# Patient Record
Sex: Female | Born: 1985 | Race: White | Hispanic: No | Marital: Single | Smoking: Current every day smoker
Health system: Southern US, Community
[De-identification: ages and names within clinical notes are randomized; demographics above are authoritative.]

## PROBLEM LIST (undated history)

## (undated) DIAGNOSIS — M1990 Unspecified osteoarthritis, unspecified site: Secondary | ICD-10-CM

## (undated) DIAGNOSIS — R51 Headache: Secondary | ICD-10-CM

## (undated) DIAGNOSIS — Z8489 Family history of other specified conditions: Secondary | ICD-10-CM

## (undated) DIAGNOSIS — T8859XA Other complications of anesthesia, initial encounter: Secondary | ICD-10-CM

## (undated) DIAGNOSIS — R519 Headache, unspecified: Secondary | ICD-10-CM

## (undated) DIAGNOSIS — J189 Pneumonia, unspecified organism: Secondary | ICD-10-CM

## (undated) DIAGNOSIS — Z87898 Personal history of other specified conditions: Secondary | ICD-10-CM

## (undated) DIAGNOSIS — I671 Cerebral aneurysm, nonruptured: Secondary | ICD-10-CM

## (undated) DIAGNOSIS — E079 Disorder of thyroid, unspecified: Secondary | ICD-10-CM

## (undated) DIAGNOSIS — K8020 Calculus of gallbladder without cholecystitis without obstruction: Secondary | ICD-10-CM

## (undated) DIAGNOSIS — K219 Gastro-esophageal reflux disease without esophagitis: Secondary | ICD-10-CM

## (undated) DIAGNOSIS — F4310 Post-traumatic stress disorder, unspecified: Secondary | ICD-10-CM

## (undated) DIAGNOSIS — J45909 Unspecified asthma, uncomplicated: Secondary | ICD-10-CM

## (undated) DIAGNOSIS — T4145XA Adverse effect of unspecified anesthetic, initial encounter: Secondary | ICD-10-CM

## (undated) DIAGNOSIS — Z8709 Personal history of other diseases of the respiratory system: Secondary | ICD-10-CM

## (undated) HISTORY — PX: ABDOMINAL HYSTERECTOMY: SHX81

## (undated) SURGERY — LAPAROSCOPY, DIAGNOSTIC
Anesthesia: General

---

## 2005-04-05 DIAGNOSIS — I671 Cerebral aneurysm, nonruptured: Secondary | ICD-10-CM

## 2005-04-05 HISTORY — DX: Cerebral aneurysm, nonruptured: I67.1

## 2007-12-01 ENCOUNTER — Observation Stay: Payer: Self-pay

## 2007-12-08 ENCOUNTER — Ambulatory Visit: Payer: Self-pay | Admitting: Obstetrics & Gynecology

## 2007-12-12 ENCOUNTER — Inpatient Hospital Stay: Payer: Self-pay | Admitting: Obstetrics & Gynecology

## 2007-12-31 ENCOUNTER — Emergency Department: Payer: Self-pay | Admitting: Emergency Medicine

## 2008-09-01 ENCOUNTER — Emergency Department: Payer: Self-pay | Admitting: Emergency Medicine

## 2010-04-30 ENCOUNTER — Emergency Department: Payer: Self-pay | Admitting: Internal Medicine

## 2010-10-12 ENCOUNTER — Emergency Department: Payer: Self-pay | Admitting: Emergency Medicine

## 2011-05-15 ENCOUNTER — Emergency Department: Payer: Self-pay | Admitting: Emergency Medicine

## 2011-05-15 LAB — PREGNANCY, URINE: Pregnancy Test, Urine: NEGATIVE m[IU]/mL

## 2011-09-04 ENCOUNTER — Emergency Department: Payer: Self-pay | Admitting: Emergency Medicine

## 2011-09-04 LAB — URINALYSIS, COMPLETE
Glucose,UR: NEGATIVE mg/dL (ref 0–75)
Ketone: NEGATIVE
Ph: 5 (ref 4.5–8.0)
Protein: NEGATIVE
RBC,UR: 42 /HPF (ref 0–5)
Renal Epithelial: <1
Squamous Epithelial: 48
WBC UR: 17 /HPF (ref 0–5)

## 2011-09-04 LAB — WET PREP, GENITAL

## 2011-09-07 LAB — BETA STREP CULTURE(ARMC)

## 2011-12-29 ENCOUNTER — Emergency Department: Payer: Self-pay

## 2011-12-29 LAB — CBC
HCT: 45.1 %
HGB: 15.7 g/dL
MCH: 31.2 pg
MCHC: 34.8 g/dL
MCV: 90 fL
Platelet: 240 x10 3/mm 3
RBC: 5.01 X10 6/mm 3
RDW: 12.6 %
WBC: 13.8 x10 3/mm 3 — ABNORMAL HIGH

## 2011-12-29 LAB — COMPREHENSIVE METABOLIC PANEL WITH GFR
Albumin: 3.8 g/dL
Alkaline Phosphatase: 72 U/L
Anion Gap: 10
BUN: 9 mg/dL
Bilirubin,Total: 0.2 mg/dL
Calcium, Total: 9 mg/dL
Chloride: 106 mmol/L
Co2: 23 mmol/L
Creatinine: 0.88 mg/dL
EGFR (African American): >60
EGFR (Non-African Amer.): >60
Glucose: 103 mg/dL — ABNORMAL HIGH
Osmolality: 276
Potassium: 3.6 mmol/L
SGOT(AST): 12 U/L — ABNORMAL LOW
SGPT (ALT): 23 U/L
Sodium: 139 mmol/L
Total Protein: 7.6 g/dL

## 2011-12-29 LAB — URINALYSIS, COMPLETE
Bilirubin,UR: NEGATIVE
Glucose,UR: NEGATIVE mg/dL (ref 0–75)
Ketone: NEGATIVE
Protein: NEGATIVE
Specific Gravity: 1.005 (ref 1.003–1.030)
Squamous Epithelial: 4
WBC UR: 3 /HPF (ref 0–5)

## 2011-12-29 LAB — LIPASE, BLOOD: Lipase: 192 U/L

## 2011-12-29 LAB — PREGNANCY, URINE: Pregnancy Test, Urine: NEGATIVE m[IU]/mL

## 2012-02-05 ENCOUNTER — Emergency Department: Payer: Self-pay | Admitting: Emergency Medicine

## 2012-02-05 LAB — URINALYSIS, COMPLETE
Bacteria: NONE SEEN
Bilirubin,UR: NEGATIVE
Ketone: NEGATIVE
Nitrite: NEGATIVE
Ph: 6 (ref 4.5–8.0)
RBC,UR: NONE SEEN /HPF (ref 0–5)
Specific Gravity: 1.001 (ref 1.003–1.030)
Squamous Epithelial: 6
WBC UR: 2 /HPF (ref 0–5)

## 2012-02-05 LAB — ETHANOL
Ethanol %: 0.045 % (ref 0.000–0.080)
Ethanol: 45 mg/dL

## 2012-02-05 LAB — COMPREHENSIVE METABOLIC PANEL
Alkaline Phosphatase: 71 U/L (ref 50–136)
Bilirubin,Total: 0.2 mg/dL (ref 0.2–1.0)
Calcium, Total: 9.1 mg/dL (ref 8.5–10.1)
Chloride: 111 mmol/L — ABNORMAL HIGH (ref 98–107)
Co2: 20 mmol/L — ABNORMAL LOW (ref 21–32)
Creatinine: 0.72 mg/dL (ref 0.60–1.30)
EGFR (African American): >60
EGFR (Non-African Amer.): >60
SGOT(AST): 21 U/L (ref 15–37)
SGPT (ALT): 25 U/L (ref 12–78)
Sodium: 143 mmol/L (ref 136–145)

## 2012-02-05 LAB — CBC
HCT: 46.3 % (ref 35.0–47.0)
HGB: 15.8 g/dL (ref 12.0–16.0)
MCHC: 34.1 g/dL (ref 32.0–36.0)
RDW: 12.7 % (ref 11.5–14.5)
WBC: 16.9 10*3/uL — ABNORMAL HIGH (ref 3.6–11.0)

## 2012-02-05 LAB — DRUG SCREEN, URINE
Amphetamines, Ur Screen: NEGATIVE (ref ?–1000)
MDMA (Ecstasy)Ur Screen: NEGATIVE (ref ?–500)
Methadone, Ur Screen: NEGATIVE (ref ?–300)
Opiate, Ur Screen: NEGATIVE (ref ?–300)

## 2012-02-05 LAB — TSH: Thyroid Stimulating Horm: <0.01 u[IU]/mL — ABNORMAL LOW

## 2012-02-06 LAB — ACETAMINOPHEN LEVEL: Acetaminophen: <2 ug/mL

## 2012-02-06 LAB — SALICYLATE LEVEL: Salicylates, Serum: 2.2 mg/dL

## 2012-07-31 DIAGNOSIS — R1010 Upper abdominal pain, unspecified: Secondary | ICD-10-CM | POA: Insufficient documentation

## 2012-07-31 DIAGNOSIS — R109 Unspecified abdominal pain: Secondary | ICD-10-CM | POA: Insufficient documentation

## 2012-08-18 DIAGNOSIS — F32A Depression, unspecified: Secondary | ICD-10-CM | POA: Insufficient documentation

## 2013-02-14 ENCOUNTER — Emergency Department: Payer: Self-pay | Admitting: Emergency Medicine

## 2013-05-17 DIAGNOSIS — H7292 Unspecified perforation of tympanic membrane, left ear: Secondary | ICD-10-CM

## 2013-05-17 DIAGNOSIS — F319 Bipolar disorder, unspecified: Secondary | ICD-10-CM | POA: Insufficient documentation

## 2013-05-17 HISTORY — DX: Unspecified perforation of tympanic membrane, left ear: H72.92

## 2013-11-08 DIAGNOSIS — J351 Hypertrophy of tonsils: Secondary | ICD-10-CM | POA: Insufficient documentation

## 2013-11-08 DIAGNOSIS — L578 Other skin changes due to chronic exposure to nonionizing radiation: Secondary | ICD-10-CM | POA: Insufficient documentation

## 2013-11-08 DIAGNOSIS — M5430 Sciatica, unspecified side: Secondary | ICD-10-CM | POA: Insufficient documentation

## 2013-11-08 DIAGNOSIS — F419 Anxiety disorder, unspecified: Secondary | ICD-10-CM | POA: Insufficient documentation

## 2013-11-08 DIAGNOSIS — E669 Obesity, unspecified: Secondary | ICD-10-CM | POA: Insufficient documentation

## 2013-11-08 HISTORY — DX: Other skin changes due to chronic exposure to nonionizing radiation: L57.8

## 2014-01-04 DIAGNOSIS — I729 Aneurysm of unspecified site: Secondary | ICD-10-CM | POA: Insufficient documentation

## 2014-01-04 HISTORY — DX: Aneurysm of unspecified site: I72.9

## 2014-04-18 DIAGNOSIS — L309 Dermatitis, unspecified: Secondary | ICD-10-CM | POA: Insufficient documentation

## 2014-10-18 ENCOUNTER — Emergency Department
Admission: EM | Admit: 2014-10-18 | Discharge: 2014-10-18 | Disposition: A | Discharge status: Home / Self Care | Payer: No Typology Code available for payment source | Attending: Emergency Medicine | Admitting: Emergency Medicine

## 2014-10-18 ENCOUNTER — Emergency Department: Payer: No Typology Code available for payment source

## 2014-10-18 DIAGNOSIS — R51 Headache: Secondary | ICD-10-CM

## 2014-10-18 DIAGNOSIS — Y9389 Activity, other specified: Secondary | ICD-10-CM | POA: Diagnosis not present

## 2014-10-18 DIAGNOSIS — S0990XA Unspecified injury of head, initial encounter: Secondary | ICD-10-CM | POA: Diagnosis not present

## 2014-10-18 DIAGNOSIS — Y92410 Unspecified street and highway as the place of occurrence of the external cause: Secondary | ICD-10-CM | POA: Diagnosis not present

## 2014-10-18 DIAGNOSIS — R519 Headache, unspecified: Secondary | ICD-10-CM

## 2014-10-18 DIAGNOSIS — Y998 Other external cause status: Secondary | ICD-10-CM | POA: Insufficient documentation

## 2014-10-18 MED ORDER — DIAZEPAM 5 MG PO TABS
ORAL_TABLET | ORAL | Status: AC
  Administered 2014-10-18: 5 mg via ORAL
  Filled 2014-10-18: qty 1

## 2014-10-18 MED ORDER — NAPROXEN 500 MG PO TABS: 500.0000 mg | ORAL_TABLET | Freq: Two times a day (BID) | ORAL | Status: DC

## 2014-10-18 MED ORDER — DIAZEPAM 5 MG PO TABS
5.0000 mg | ORAL_TABLET | Freq: Once | ORAL | Status: AC
  Administered 2014-10-18: 5 mg via ORAL

## 2014-10-18 MED ORDER — DIAZEPAM 2 MG PO TABS: 2.0000 mg | ORAL_TABLET | Freq: Three times a day (TID) | ORAL | Status: DC | PRN
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
## 2014-10-18 NOTE — ED Notes (Signed)
Patient transported to CT 
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
## 2015-05-01 ENCOUNTER — Emergency Department
Admission: EM | Admit: 2015-05-01 | Discharge: 2015-05-01 | Disposition: A | Discharge status: Home / Self Care | Payer: Medicaid Other | Attending: Emergency Medicine | Admitting: Emergency Medicine

## 2015-05-01 ENCOUNTER — Emergency Department: Payer: Medicaid Other

## 2015-05-01 DIAGNOSIS — Z880 Allergy status to penicillin: Secondary | ICD-10-CM | POA: Insufficient documentation

## 2015-05-01 DIAGNOSIS — F17200 Nicotine dependence, unspecified, uncomplicated: Secondary | ICD-10-CM | POA: Diagnosis not present

## 2015-05-01 DIAGNOSIS — Z3202 Encounter for pregnancy test, result negative: Secondary | ICD-10-CM | POA: Insufficient documentation

## 2015-05-01 DIAGNOSIS — A0811 Acute gastroenteropathy due to Norwalk agent: Secondary | ICD-10-CM | POA: Insufficient documentation

## 2015-05-01 DIAGNOSIS — R1011 Right upper quadrant pain: Secondary | ICD-10-CM | POA: Diagnosis present

## 2015-05-01 HISTORY — DX: Unspecified osteoarthritis, unspecified site: M19.90

## 2015-05-01 HISTORY — DX: Calculus of gallbladder without cholecystitis without obstruction: K80.20

## 2015-05-01 HISTORY — DX: Unspecified asthma, uncomplicated: J45.909

## 2015-05-01 HISTORY — DX: Cerebral aneurysm, nonruptured: I67.1

## 2015-05-01 LAB — LIPASE, BLOOD: Lipase: 32 U/L (ref 11–51)

## 2015-05-01 LAB — URINALYSIS COMPLETE WITH MICROSCOPIC (ARMC ONLY)
BILIRUBIN URINE: NEGATIVE
GLUCOSE, UA: NEGATIVE mg/dL
Hgb urine dipstick: NEGATIVE
KETONES UR: NEGATIVE mg/dL
Leukocytes, UA: NEGATIVE
Nitrite: NEGATIVE
PH: 6 (ref 5.0–8.0)
Protein, ur: NEGATIVE mg/dL
Specific Gravity, Urine: 1.015 (ref 1.005–1.030)

## 2015-05-01 LAB — COMPREHENSIVE METABOLIC PANEL
ALK PHOS: 61 U/L (ref 38–126)
ALT: 20 U/L (ref 14–54)
AST: 15 U/L (ref 15–41)
Albumin: 4 g/dL (ref 3.5–5.0)
Anion gap: 7 (ref 5–15)
BUN: 10 mg/dL (ref 6–20)
CALCIUM: 9.2 mg/dL (ref 8.9–10.3)
CO2: 24 mmol/L (ref 22–32)
CREATININE: 0.72 mg/dL (ref 0.44–1.00)
Chloride: 108 mmol/L (ref 101–111)
GFR calc non Af Amer: >60 mL/min (ref >60)
Glucose, Bld: 102 mg/dL — ABNORMAL HIGH (ref 65–99)
Potassium: 4 mmol/L (ref 3.5–5.1)
Sodium: 139 mmol/L (ref 135–145)
Total Bilirubin: <0.1 mg/dL — ABNORMAL LOW (ref 0.3–1.2)
Total Protein: 6.9 g/dL (ref 6.5–8.1)

## 2015-05-01 LAB — CBC
HCT: 44.8 % (ref 35.0–47.0)
Hemoglobin: 15 g/dL (ref 12.0–16.0)
MCH: 29.3 pg (ref 26.0–34.0)
MCHC: 33.6 g/dL (ref 32.0–36.0)
MCV: 87.2 fL (ref 80.0–100.0)
PLATELETS: 240 10*3/uL (ref 150–440)
RBC: 5.14 MIL/uL (ref 3.80–5.20)
RDW: 13.2 % (ref 11.5–14.5)
WBC: 14.4 10*3/uL — ABNORMAL HIGH (ref 3.6–11.0)

## 2015-05-01 LAB — POCT PREGNANCY, URINE: Preg Test, Ur: NEGATIVE

## 2015-05-01 MED ORDER — ONDANSETRON HCL 4 MG/2ML IJ SOLN
4.0000 mg | Freq: Once | INTRAMUSCULAR | Status: AC
  Administered 2015-05-01: 4 mg via INTRAVENOUS
  Filled 2015-05-01: qty 2

## 2015-05-01 MED ORDER — MORPHINE SULFATE (PF) 4 MG/ML IV SOLN
4.0000 mg | Freq: Once | INTRAVENOUS | Status: AC
  Administered 2015-05-01: 4 mg via INTRAVENOUS
  Filled 2015-05-01: qty 1

## 2015-05-01 MED ORDER — PROMETHAZINE HCL 25 MG PO TABS: 25.0000 mg | ORAL_TABLET | Freq: Four times a day (QID) | ORAL | Status: DC | PRN

## 2015-05-01 MED ORDER — SODIUM CHLORIDE 0.9 % IV SOLN
1000.0000 mL | Freq: Once | INTRAVENOUS | Status: AC
  Administered 2015-05-01: 1000 mL via INTRAVENOUS

## 2015-05-01 MED ORDER — MORPHINE SULFATE (PF) 4 MG/ML IV SOLN: 4.0000 mg | Freq: Once | INTRAVENOUS | Status: DC

## 2015-05-01 MED ORDER — PROMETHAZINE HCL 25 MG/ML IJ SOLN
25.0000 mg | Freq: Once | INTRAMUSCULAR | Status: AC
  Administered 2015-05-01: 25 mg via INTRAVENOUS
  Filled 2015-05-01: qty 1

## 2015-05-01 MED ORDER — OXYCODONE-ACETAMINOPHEN 5-325 MG PO TABS: 2.0000 | ORAL_TABLET | Freq: Four times a day (QID) | ORAL | Status: DC | PRN

## 2015-05-01 NOTE — ED Provider Notes (Signed)
[facility] Emergency Department Provider Note     Time seen: ----------------------------------------- 7:10 AM on 05/01/2015 -----------------------------------------    I have reviewed the triage vital signs and the nursing notes.   HISTORY  Chief Complaint Abdominal Pain    HPI Elizabeth Spencer is a 30 y.o. female who presents to ER for nausea vomiting and diarrhea. Patient states the last 3 days she has been unable to keep anything down. She does complain of diffuse abdominal pain, worse on the left side. Reportedly she has history of gallstones not pain is unchanged in the right upper quadrant. Sharp pain on left side is worse with walking.   Past Medical History  Diagnosis Date  . Brain aneurysm   . Arthritis   . Asthma   . Gallstones     There are no active problems to display for this patient.   Past Surgical History  Procedure Laterality Date  . Cesarean section      x3    Allergies Codeine and Penicillins  Social History Social History  Substance Use Topics  . Smoking status: Current Every Day Smoker  . Smokeless tobacco: None  . Alcohol Use: No    Review of Systems Constitutional: Negative for fever. Eyes: Negative for visual changes. ENT: Negative for sore throat. Cardiovascular: Negative for chest pain. Respiratory: Negative for shortness of breath. Gastrointestinal: Positive for abdominal pain, vomiting and diarrhea Genitourinary: Positive for urinary hesitancy Musculoskeletal: Negative for back pain. Skin: Negative for rash. Neurological: Negative for headaches, focal weakness or numbness.  10-point ROS otherwise negative.  ____________________________________________   PHYSICAL EXAM:  VITAL SIGNS: ED Triage Vitals  Enc Vitals Group     BP 05/01/15 0603 122/74 mmHg     Pulse Rate 05/01/15 0603 109     Resp 05/01/15 0603 20     Temp 05/01/15 0603 98.1 F (36.7 C)     Temp Source 05/01/15 0603 Oral      SpO2 05/01/15 0603 97 %     Weight 05/01/15 0603 259 lb (117.482 kg)     Height 05/01/15 0603  (1.6 m)     Head Cir --      Peak Flow --      Pain Score 05/01/15 0605 6     Pain Loc --      Pain Edu? --      Excl. in GC? --     Constitutional: Alert and oriented. Well appearing and in no distress. Eyes: Conjunctivae are normal. PERRL. Normal extraocular movements. ENT   Head: Normocephalic and atraumatic.   Nose: No congestion/rhinnorhea.   Mouth/Throat: Mucous membranes are moist.   Neck: No stridor. Cardiovascular: Normal rate, regular rhythm. Normal and symmetric distal pulses are present in all extremities. No murmurs, rubs, or gallops. Respiratory: Normal respiratory effort without tachypnea nor retractions. Breath sounds are clear and equal bilaterally. No wheezes/rales/rhonchi. Gastrointestinal: Soft and non-focally tender, normal bowel sounds. Musculoskeletal: Nontender with normal range of motion in all extremities. No joint effusions.  No lower extremity tenderness nor edema. Neurologic:  Normal speech and language. No gross focal neurologic deficits are appreciated. Speech is normal. No gait instability. Skin:  Skin is warm, dry and intact. No rash noted. Psychiatric: Mood and affect are normal. Speech and behavior are normal. Patient exhibits appropriate insight and judgment. ____________________________________________  ED COURSE:  Pertinent labs & imaging results that were available during my care of the patient were reviewed by me and considered in my medical decision making (  see chart for details). Patient looks well clinically, will receive IV fluids antiemetics and pain control. ____________________________________________    LABS (pertinent positives/negatives)  Labs Reviewed  COMPREHENSIVE METABOLIC PANEL - Abnormal; Notable for the following:    Glucose, Bld 102 (*)    Total Bilirubin <0.1 (*)    All other components within normal limits   CBC - Abnormal; Notable for the following:    WBC 14.4 (*)    All other components within normal limits  URINALYSIS COMPLETEWITH MICROSCOPIC ([facility] ONLY) - Abnormal; Notable for the following:    Color, Urine YELLOW (*)    APPearance CLOUDY (*)    Bacteria, UA FEW (*)    Squamous Epithelial / LPF 0-5 (*)    All other components within normal limits  LIPASE, BLOOD  POC URINE PREG, ED  POCT PREGNANCY, URINE    RADIOLOGY Images were viewed by me  Abdomen 2 view IMPRESSION: 1. Possible cholelithiasis or vicariously excreted contrast in the gallbladder. 2. Normal bowel gas pattern. 3. The presence or absence of free peritoneal air cannot be assessed due to the fact that the hemidiaphragms are not included on the upright view. ____________________________________________  FINAL ASSESSMENT AND PLAN  Gastroenteritis  Plan: Patient with labs and imaging as dictated above. Patient with benign exam, the presence of previously known gallbladder stones was identified here. The pain does not seem to be associated with this. She'll be referred to general surgery for follow-up. She'll be discharged with antiemetics today.   Emily Filbert, MD   Emily Filbert, MD 05/01/15 [phone]

## 2015-05-01 NOTE — Discharge Instructions (Signed)

## 2015-05-01 NOTE — ED Notes (Addendum)
RUQ pain 6/10. Pt states that she is unable to pee, only goes in scant amounts. Sharp pain worse when walking. Pt awoke with this pain. NVD X 3 days. Intermittent chest tightness X 1 week.

## 2015-05-01 NOTE — ED Notes (Signed)
Pt unable to urinate at this time for sample. Informed of need for sample.

## 2015-05-01 NOTE — ED Notes (Signed)
Patient here for abdominal pain, has had emesis x 3 days. Also diagnosed with gallstones by this hospital but has not seen the surgeon we referred her to and does not recall their name. Patient is ambulatory with a slow but steady gait. Color normal for ethnicity. Mucus membranes moist.

## 2015-05-01 NOTE — ED Notes (Signed)
Report to Steven, RN

## 2015-05-01 NOTE — ED Notes (Signed)
MD at bedside. 

## 2015-07-10 ENCOUNTER — Ambulatory Visit: Payer: No Typology Code available for payment source | Attending: Anesthesiology | Admitting: Anesthesiology

## 2015-07-10 ENCOUNTER — Encounter: Payer: Self-pay | Admitting: Anesthesiology

## 2015-07-10 VITALS — BP 107/68 | HR 101 | Temp 97.8°F | Resp 16 | Ht 63.0 in | Wt 260.0 lb

## 2015-07-10 DIAGNOSIS — M5136 Other intervertebral disc degeneration, lumbar region: Secondary | ICD-10-CM

## 2015-07-10 DIAGNOSIS — M5416 Radiculopathy, lumbar region: Secondary | ICD-10-CM

## 2015-07-10 DIAGNOSIS — M545 Low back pain, unspecified: Secondary | ICD-10-CM

## 2015-07-10 NOTE — Patient Instructions (Signed)
Epidural Steroid Injection Patient Information  Description: The epidural space surrounds the nerves as they exit the spinal cord.  In some patients, the nerves can be compressed and inflamed by a bulging disc or a tight spinal canal (spinal stenosis).  By injecting steroids into the epidural space, we can bring irritated nerves into direct contact with a potentially helpful medication.  These steroids act directly on the irritated nerves and can reduce swelling and inflammation which often leads to decreased pain.  Epidural steroids may be injected anywhere along the spine and from the neck to the low back depending upon the location of your pain.   After numbing the skin with local anesthetic (like Novocaine), a small needle is passed into the epidural space slowly.  You may experience a sensation of pressure while this is being done.  The entire block usually last less than 10 minutes.  Conditions which may be treated by epidural steroids:   Low back and leg pain  Neck and arm pain  Spinal stenosis  Post-laminectomy syndrome  Herpes zoster (shingles) pain  Pain from compression fractures  Preparation for the injection:  1. Do not eat any solid food or dairy products within 8 hours of your appointment.  2. You may drink clear liquids up to 3 hours before appointment.  Clear liquids include water, black coffee, juice or soda.  No milk or cream please. 3. You may take your regular medication, including pain medications, with a sip of water before your appointment  Diabetics should hold regular insulin (if taken separately) and take 1/2 normal NPH dos the morning of the procedure.  Carry some sugar containing items with you to your appointment. 4. A driver must accompany you and be prepared to drive you home after your procedure.  5. Bring all your current medications with your. 6. An IV may be inserted and sedation may be given at the discretion of the physician.   7. A blood pressure  cuff, EKG and other monitors will often be applied during the procedure.  Some patients may need to have extra oxygen administered for a short period. 8. You will be asked to provide medical information, including your allergies, prior to the procedure.  We must know immediately if you are taking blood thinners (like Coumadin/Warfarin)  Or if you are allergic to IV iodine contrast (dye). We must know if you could possible be pregnant.  Possible side-effects:  Bleeding from needle site  Infection (rare, may require surgery)  Nerve injury (rare)  Numbness & tingling (temporary)  Difficulty urinating (rare, temporary)  Spinal headache ( a headache worse with upright posture)  Light -headedness (temporary)  Pain at injection site (several days)  Decreased blood pressure (temporary)  Weakness in arm/leg (temporary)  Pressure sensation in back/neck (temporary)  Call if you experience:  Fever/chills associated with headache or increased back/neck pain.  Headache worsened by an upright position.  New onset weakness or numbness of an extremity below the injection site  Hives or difficulty breathing (go to the emergency room)  Inflammation or drainage at the infection site  Severe back/neck pain  Any new symptoms which are concerning to you  Please note:  Although the local anesthetic injected can often make your back or neck feel good for several hours after the injection, the pain will likely return.  It takes 3-7 days for steroids to work in the epidural space.  You may not notice any pain relief for at least that one week.    If effective, we will often do a series of three injections spaced 3-6 weeks apart to maximally decrease your pain.  After the initial series, we generally will wait several months before considering a repeat injection of the same type.  If you have any questions, please call [phone] [facility] 

## 2015-07-10 NOTE — Progress Notes (Signed)
Safety precautions to be maintained throughout the outpatient stay will include: orient to surroundings, keep bed in low position, maintain call bell within reach at all times, provide assistance with transfer out of bed and ambulation.  

## 2015-07-11 MED ORDER — MELOXICAM 7.5 MG PO TABS: 7.5000 mg | ORAL_TABLET | Freq: Every day | ORAL | Status: DC
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
## 2015-09-05 ENCOUNTER — Encounter: Payer: Self-pay | Admitting: Anesthesiology

## 2015-09-05 ENCOUNTER — Ambulatory Visit: Payer: Medicaid Other | Attending: Anesthesiology | Admitting: Anesthesiology

## 2015-09-05 VITALS — BP 112/70 | HR 105 | Temp 98.1°F | Resp 16 | Ht 63.0 in | Wt 216.0 lb

## 2015-09-05 DIAGNOSIS — M5116 Intervertebral disc disorders with radiculopathy, lumbar region: Secondary | ICD-10-CM | POA: Diagnosis not present

## 2015-09-05 DIAGNOSIS — M5136 Other intervertebral disc degeneration, lumbar region: Secondary | ICD-10-CM

## 2015-09-05 DIAGNOSIS — M545 Low back pain, unspecified: Secondary | ICD-10-CM

## 2015-09-05 DIAGNOSIS — G8929 Other chronic pain: Secondary | ICD-10-CM | POA: Diagnosis not present

## 2015-09-05 MED ORDER — CELECOXIB 200 MG PO CAPS: 200.0000 mg | ORAL_CAPSULE | Freq: Two times a day (BID) | ORAL | Status: DC

## 2015-09-05 NOTE — Progress Notes (Signed)
Patient here for procedure today.  Patient states that she has had a cough x 3 weeks and was running a temperature on yesterday.  Did not measure the temperature.   Patient can't take the dosage of Mobic at 7.5 mg and would like dosage to be lowered if possible.  Safety precautions to be maintained throughout the outpatient stay will include: orient to surroundings, keep bed in low position, maintain call bell within reach at all times, provide assistance with transfer out of bed and ambulation.

## 2015-09-08 ENCOUNTER — Telehealth: Payer: Self-pay | Admitting: *Deleted

## 2015-09-08 NOTE — Progress Notes (Signed)
   Subjective:    Patient ID: Elizabeth DallasSusan Spencer, female    DOB: 11/23/1985, 30 y.o.   MRN: [id number]  HPI  This patient was scheduled for a caudal epidural steroid injection and she opted not to have it She [place] and will continue her care there She indicated that the meloxicam which was helping her pain was also causing gastric  Irritation Will switch from meloxicam to Celebrex since she is not hypertensive She appears to be in no distress and has no other complaints  Review of Systems  Constitutional: Negative.   HENT: Negative.   Eyes: Negative.   Respiratory: Negative.   Cardiovascular: Negative.   Gastrointestinal: Negative.   Endocrine: Negative.   Genitourinary: Negative.   Musculoskeletal: Negative.   Skin: Negative.   Allergic/Immunologic: Negative.   Neurological: Negative.   Hematological: Negative.   Psychiatric/Behavioral: Negative.        Objective:   Physical Exam  Cardiovascular:  His patient appeared to be in no distress Her vital signs were stable Blood pressure was 112/70 mmHg Temperature was 98.37F Pulse was 105 bpm Respirations was 16 breaths per minute  SPO2 was 100% Were no new neurological or musculoskeletal findings  Nursing note and vitals reviewed.         Assessment & Plan:   Assessment 1 chronic low back pain 2 lumbar degenerative disc disease 3 Lumbar radiculopathy   Plan of management 1 We'll plan to discontinue meloxicam 2 We'll begin Celebrex 200 mg twice a day aand give her extended tablets 3 [facility] [place]    Established patient       Level III   Tod PersiaWinston Taijon Vink M.D.

## 2015-09-08 NOTE — Telephone Encounter (Signed)
[facility] called stating that the pt needs a prior autho from her insurance for the Celebrex. The phone number for [facility] is [phone] or [phone]...thanks

## 2015-09-08 NOTE — Telephone Encounter (Signed)
No answer- unable to leave message- mailbox not set up

## 2015-09-26 ENCOUNTER — Telehealth: Payer: Self-pay | Admitting: *Deleted

## 2015-09-26 MED ORDER — MELOXICAM 7.5 MG PO TABS: 7.5000 mg | ORAL_TABLET | Freq: Two times a day (BID) | ORAL | Status: DC

## 2015-09-26 NOTE — Addendum Note (Signed)
Addended by: Tod PersiaPARRIS, Xian Apostol on: 09/26/2015 09:40 AM   Modules accepted: Orders, Medications

## 2015-09-26 NOTE — Progress Notes (Signed)
   Subjective:    Patient ID: Elizabeth DallasSusan Spencer, female    DOB: 09/19/1985, 30 y.o.   MRN: [id number]  HPI  This patient was prescribed Celebrex for her pain that her insurance company refused coverage of that drug I will therefore with drawer the Celebrex prescription and instead authorize meloxicam 7.5 mg twice a day pc.  and give her 42 tablets.  Tod PersiaWinston Emmet Messer M.D.  Review of Systems     Objective:   Physical Exam        Assessment & Plan:

## 2015-09-26 NOTE — Telephone Encounter (Signed)
Patient notified that Meloxicam has been called in. Patient states she cannot take Meloxicam because it is too strong. Does not know what similar medication insurance will cover. Dr. Starling MannsParris notified. Instructed to have patient take it once per day instead of twice per day. Attempted to call patient, voice mailbox not set up.

## 2015-09-26 NOTE — Telephone Encounter (Signed)
[facility] called inquiring about the status of the med they requested the this pt. The number is [phone]...thanks

## 2015-09-26 NOTE — Telephone Encounter (Signed)
Patient advised to try taking one Meloxicam per day. She says she has tried that before, it makes her sick. She will call her insurance company and find out what NSAIDS they will cover and call us back.

## 2015-11-15 ENCOUNTER — Emergency Department
Admission: EM | Admit: 2015-11-15 | Discharge: 2015-11-15 | Disposition: A | Discharge status: Home / Self Care | Payer: Medicaid Other | Attending: Emergency Medicine | Admitting: Emergency Medicine

## 2015-11-15 DIAGNOSIS — Z79899 Other long term (current) drug therapy: Secondary | ICD-10-CM | POA: Insufficient documentation

## 2015-11-15 DIAGNOSIS — Z791 Long term (current) use of non-steroidal anti-inflammatories (NSAID): Secondary | ICD-10-CM | POA: Insufficient documentation

## 2015-11-15 DIAGNOSIS — K0889 Other specified disorders of teeth and supporting structures: Secondary | ICD-10-CM | POA: Insufficient documentation

## 2015-11-15 DIAGNOSIS — F17200 Nicotine dependence, unspecified, uncomplicated: Secondary | ICD-10-CM | POA: Insufficient documentation

## 2015-11-15 DIAGNOSIS — J45909 Unspecified asthma, uncomplicated: Secondary | ICD-10-CM | POA: Diagnosis not present

## 2015-11-15 MED ORDER — OXYCODONE-ACETAMINOPHEN 5-325 MG PO TABS: 1.0000 | ORAL_TABLET | ORAL | 0 refills | Status: DC | PRN

## 2015-11-15 MED ORDER — LIDOCAINE VISCOUS 2 % MT SOLN
15.0000 mL | Freq: Once | OROMUCOSAL | Status: AC
  Administered 2015-11-15: 15 mL via OROMUCOSAL
  Filled 2015-11-15: qty 15

## 2015-11-15 MED ORDER — KETOROLAC TROMETHAMINE 10 MG PO TABS
10.0000 mg | ORAL_TABLET | Freq: Once | ORAL | Status: AC
  Administered 2015-11-15: 10 mg via ORAL
  Filled 2015-11-15: qty 1

## 2015-11-15 MED ORDER — CLINDAMYCIN HCL 150 MG PO CAPS
300.0000 mg | ORAL_CAPSULE | Freq: Once | ORAL | Status: AC
  Administered 2015-11-15: 300 mg via ORAL
  Filled 2015-11-15: qty 2

## 2015-11-15 MED ORDER — CLINDAMYCIN HCL 300 MG PO CAPS: 300.0000 mg | ORAL_CAPSULE | Freq: Three times a day (TID) | ORAL | 0 refills | Status: AC

## 2015-11-15 NOTE — ED Notes (Signed)
Pt alert and oriented X4, active, cooperative, pt in NAD. RR even and unlabored, color WNL.  Pt informed to return if any life threatening symptoms occur.   

## 2015-11-15 NOTE — ED Triage Notes (Addendum)
Pt ambulatory to triage with no difficulty. Pt reports she had a filling to a top left molar several months ago. Pt reports since she has been having pain to the tooth. About 3 weeks ago she saw her primary care doctor and she put her on antibiotics for "pain in her ear". Yesterday she developed swelling to her left face and worse pain to her tooth. Pt talking in full and complete sentences at this time with no difficulty.

## 2015-11-15 NOTE — ED Provider Notes (Signed)
[facility] Emergency Department Provider Note  ____________________________________________   First MD Initiated Contact with Patient 11/15/15 0113     (approximate)  I have reviewed the triage vital signs and the nursing notes.   HISTORY  Chief Complaint Otalgia and Dental Pain   HPI Elizabeth Spencer is a 30 y.o. female presents with left maxillary molar pain that is currently 10 out of 10 with onset several months ago after having filling performed. Patient states this episode has been going on for the past day with associated swelling of the jaw. Patient states that she was prescribed antibiotics by her primary care provider however she cannot recall the name of the antibiotic stating that it was 300 mg. Patient states that she is allergic to many different antibiotics.  Past Medical History:  Diagnosis Date  . Arthritis   . Asthma   . Brain aneurysm   . Gallstones     There are no active problems to display for this patient.   Past Surgical History:  Procedure Laterality Date  . CESAREAN SECTION     x3    Prior to Admission medications   Medication Sig Start Date End Date Taking? Authorizing Provider  albuterol (PROVENTIL HFA;VENTOLIN HFA) 108 (90 Base) MCG/ACT inhaler Inhale 1-2 puffs into the lungs every 6 (six) hours as needed.  01/03/15   Historical Provider, MD  cetirizine (ZYRTEC) 10 MG tablet Take 10 mg by mouth as needed. Reported on 09/05/2015 01/03/15 01/03/16  Historical Provider, MD  clindamycin (CLEOCIN) 300 MG capsule Take 1 capsule (300 mg total) by mouth 3 (three) times daily. 11/15/15 11/25/15  Darci Current, MD  EPIPEN 2-PAK 0.3 MG/0.3ML SOAJ injection INJECT INTO THIGH MUSCLE THROUGH CLOTHES AS NEEDED SEVERE ALLERGIC REACTION 01/29/15   Historical Provider, MD  meloxicam (MOBIC) 7.5 MG tablet Take 1 tablet (7.5 mg total) by mouth 2 (two) times daily after a meal. 09/26/15   Tod Persia, MD  naproxen sodium (ANAPROX) 550 MG tablet  Take 550 mg by mouth 3 (three) times daily with meals.    Historical Provider, MD  oxyCODONE-acetaminophen (PERCOCET) 5-325 MG tablet Take 2 tablets by mouth every 6 (six) hours as needed for moderate pain or severe pain. Patient not taking: Reported on 07/10/2015 05/01/15   Emily Filbert, MD  oxyCODONE-acetaminophen (ROXICET) 5-325 MG tablet Take 1 tablet by mouth every 4 (four) hours as needed for severe pain. 11/15/15   Darci Current, MD  promethazine (PHENERGAN) 25 MG tablet Take 1 tablet (25 mg total) by mouth every 6 (six) hours as needed for nausea or vomiting. Patient not taking: Reported on 07/10/2015 05/01/15   Emily Filbert, MD    Allergies Neurontin [gabapentin]; Oxycodone; Codeine; and Other  Family History  Problem Relation Age of Onset  . Hypertension Mother   . Alcohol abuse Mother   . Alcohol abuse Father   . COPD Father   . Emphysema Father   . Heart disease Father     Social History Social History  Substance Use Topics  . Smoking status: Current Every Day Smoker  . Smokeless tobacco: Not on file  . Alcohol use No    Review of Systems Constitutional: No fever/chills Eyes: No visual changes. ENT: No sore throat.Positive for dental pain Cardiovascular: Denies chest pain. Respiratory: Denies shortness of breath. Gastrointestinal: No abdominal pain.  No nausea, no vomiting.  No diarrhea.  No constipation. Genitourinary: Negative for dysuria. Musculoskeletal: Negative for back pain. Skin: Negative for rash.  Neurological: Negative for headaches, focal weakness or numbness.  10-point ROS otherwise negative.  ____________________________________________   PHYSICAL EXAM:  VITAL SIGNS: ED Triage Vitals  Enc Vitals Group     BP 11/15/15 0027 115/63     Pulse Rate 11/15/15 0027 98     Resp 11/15/15 0027 18     Temp 11/15/15 0027 98.2 F (36.8 C)     Temp Source 11/15/15 0027 Oral     SpO2 11/15/15 0027 100 %     Weight 11/15/15 0027 235 lb  (106.6 kg)     Height 11/15/15 0027 5\' 3"  (1.6 m)     Head Circumference --      Peak Flow --      Pain Score 11/15/15 0028 10     Pain Loc --      Pain Edu? --      Excl. in GC? --     Constitutional: Alert and oriented. Well appearing and in no acute distress. Eyes: Conjunctivae are normal. PERRL. EOMI. Head: Atraumatic. Ears:  Healthy appearing ear canals and TMs bilaterally Nose: No congestion/rhinnorhea. Mouth/Throat: Mucous membranes are moist.  Oropharynx non-erythematous. Neck: No stridor.  No meningeal signs. Skin:  Skin is warm, dry and intact. No rash noted. Psychiatric: Mood and affect are normal. Speech and behavior are normal.  ____________________________________________   LABS (all labs ordered are listed, but only abnormal results are displayed)  Labs Reviewed - No data to display   RADIOLOGY I, Wayzata N Jodee Wagenaar, personally viewed and evaluated these images (plain radiographs) as part of my medical decision making, as well as reviewing the written report by the radiologist.  No results found.   Procedures     INITIAL IMPRESSION / ASSESSMENT AND PLAN / ED COURSE  Pertinent labs & imaging results that were available during my care of the patient were reviewed by me and considered in my medical decision making (see chart for details).  Patient given Percocet and clindamycin his dislike insufficiency that and emergency department. Patient referred to dentist  Clinical Course    ____________________________________________  FINAL CLINICAL IMPRESSION(S) / ED DIAGNOSES  Final diagnoses:  Pain, dental     MEDICATIONS GIVEN DURING THIS VISIT:  Medications  ketorolac (TORADOL) tablet 10 mg (10 mg Oral Given 11/15/15 0147)  lidocaine (XYLOCAINE) 2 % viscous mouth solution 15 mL (15 mLs Mouth/Throat Given 11/15/15 0148)  clindamycin (CLEOCIN) capsule 300 mg (300 mg Oral Given 11/15/15 0147)     NEW OUTPATIENT MEDICATIONS STARTED DURING THIS  VISIT:  New Prescriptions   CLINDAMYCIN (CLEOCIN) 300 MG CAPSULE    Take 1 capsule (300 mg total) by mouth 3 (three) times daily.   OXYCODONE-ACETAMINOPHEN (ROXICET) 5-325 MG TABLET    Take 1 tablet by mouth every 4 (four) hours as needed for severe pain.      Note:  This document was prepared using Dragon voice recognition software and may include unintentional dictation errors.    Darci Currentandolph N Racquelle Hyser, MD 11/15/15 [phone]

## 2016-01-27 DIAGNOSIS — R053 Chronic cough: Secondary | ICD-10-CM

## 2016-01-27 HISTORY — DX: Chronic cough: R05.3

## 2016-07-05 ENCOUNTER — Telehealth: Payer: Self-pay | Admitting: Obstetrics & Gynecology

## 2016-07-05 NOTE — Telephone Encounter (Signed)
Called and LVM To schedule Pt for Referral From [facility] for irregular Mense. Please schedule when Pt calls

## 2016-07-06 NOTE — Telephone Encounter (Signed)
Pt is schedule with DR. Jean Rosenthal 07/28/16 at 3:10

## 2016-07-28 ENCOUNTER — Ambulatory Visit: Payer: Self-pay | Admitting: Obstetrics and Gynecology

## 2016-08-10 ENCOUNTER — Ambulatory Visit (INDEPENDENT_AMBULATORY_CARE_PROVIDER_SITE_OTHER): Payer: Medicaid Other | Admitting: Obstetrics and Gynecology

## 2016-08-10 ENCOUNTER — Encounter: Payer: Self-pay | Admitting: Obstetrics and Gynecology

## 2016-08-10 VITALS — BP 122/72 | HR 97 | Ht 63.0 in | Wt 246.0 lb

## 2016-08-10 DIAGNOSIS — N930 Postcoital and contact bleeding: Secondary | ICD-10-CM | POA: Diagnosis not present

## 2016-08-10 DIAGNOSIS — N907 Vulvar cyst: Secondary | ICD-10-CM | POA: Diagnosis not present

## 2016-08-18 NOTE — Progress Notes (Signed)
Obstetrics & Gynecology Office Visit   Chief Complaint:  Chief Complaint  Patient presents with  . vaginal lump    painful x 1 day    History of Present Illness: 31 year old G3P3 presenting with labial lesion noted approximately 3 days ago following intercourse.  No vaginal discharge, no ulceration or drainage from lesion noted by patient.  The lesion is not significantly tender but does cause some discomfort.  She has this happen previously with area of concern resolving spontaneously.  No new contacts exposures such as new soaps, detergents, condoms etc.  She does report postcoital bleeding.  No history of HSV.   Review of Systems: Review of Systems  Constitutional: Negative for chills and fever.  Skin: Positive for rash. Negative for itching.  Remainder of review of systems negative unless noted in HPI  Past Medical History:  Past Medical History:  Diagnosis Date  . Arthritis   . Asthma   . Brain aneurysm   . Gallstones     Past Surgical History:  Past Surgical History:  Procedure Laterality Date  . CESAREAN SECTION     x3    Gynecologic History: Patient's last menstrual period was 07/27/2016 (exact date).  Obstetric History: G3P3  Family History:  Family History  Problem Relation Age of Onset  . Hypertension Mother   . Alcohol abuse Mother   . Alcohol abuse Father   . COPD Father   . Emphysema Father   . Heart disease Father     Social History:  Social History   Social History  . Marital status: Single    Spouse name: N/A  . Number of children: N/A  . Years of education: N/A   Occupational History  . Not on file.   Social History Main Topics  . Smoking status: Current Every Day Smoker  . Smokeless tobacco: Never Used  . Alcohol use No  . Drug use: No  . Sexual activity: Yes    Partners: Male    Birth control/ protection: None   Other Topics Concern  . Not on file   Social History Narrative  . No narrative on file    Allergies:    Allergies  Allergen Reactions  . Neurontin [Gabapentin] Other (See Comments)    Does not remember anything  . Oxycodone Nausea And Vomiting    dizziness  . Codeine Hives  . Other Other (See Comments)    PT states she is allergic to any of the Cillins but also states that every time she is prescribed a new antibiotic she adds it to the list.  PT has throat swelling and trouble breathing currently with all Cillins.     Medications: Prior to Admission medications   Medication Sig Start Date End Date Taking? Authorizing Provider  albuterol (PROVENTIL HFA;VENTOLIN HFA) 108 (90 Base) MCG/ACT inhaler Inhale 1-2 puffs into the lungs every 6 (six) hours as needed.  01/03/15  Yes [provider]  EPIPEN 2-PAK 0.3 MG/0.3ML SOAJ injection INJECT INTO THIGH MUSCLE THROUGH CLOTHES AS NEEDED SEVERE ALLERGIC REACTION 01/29/15  Yes [provider]  cetirizine (ZYRTEC) 10 MG tablet Take 10 mg by mouth as needed. Reported on 09/05/2015 01/03/15 01/03/16  [provider]    Physical Exam Vitals:  Vitals:   08/10/16 1410  BP: 122/72  Pulse: 97   Patient's last menstrual period was 07/27/2016 (exact date).  General: NAD HEENT: normocephalic, anicteric Pulmonary: No increased work of breathing Genitourinary:  External: Normal external female  genitalia.  Normal urethral meatus, normal Bartholin's and Skene's glands.  Area in question is to the right of the  clitoral hood. Small 3mm raised round area, soft, mobile, no induration or erythema   Vagina: Normal vaginal mucosa, no evidence of prolapse.    Cervix: Grossly normal in appearance, no bleeding  Uterus: Non-enlarged, mobile, normal contour.  No CMT  Adnexa: ovaries non-enlarged, no adnexal masses  Rectal: deferred  Lymphatic: no evidence of inguinal lymphadenopathy Extremities: no edema, erythema, or tenderness Neurologic: Grossly intact Psychiatric: mood appropriate, affect full  Physical Exam  Genitourinary:       Female chaperone present for pelvic portions of the physical exam  Assessment: 31 y.o. G3P3 with labial lesion  Plan: Problem List Items Addressed This Visit    None    Visit Diagnoses    Postcoital and contact bleeding    -  Primary   Relevant Orders   US Transvaginal Non-OB   Labial cyst         Lesion does not appear to be infectious, this could represent some normal lymphatic tissue, clogged pore.  Recommend warm compresses and observation.  Will obtain nuswab to rule out infectious etiology for postcoital spotting, cervix grossly normal in appearance.  TVUS ordered to evaluation of intrauterine pathology

## 2016-08-20 ENCOUNTER — Ambulatory Visit: Payer: Self-pay | Admitting: Obstetrics and Gynecology

## 2016-08-23 ENCOUNTER — Ambulatory Visit: Payer: Self-pay | Admitting: Obstetrics and Gynecology

## 2016-08-23 ENCOUNTER — Other Ambulatory Visit: Payer: Self-pay

## 2016-09-03 ENCOUNTER — Other Ambulatory Visit: Payer: Self-pay

## 2016-09-03 ENCOUNTER — Ambulatory Visit: Payer: Medicaid Other | Admitting: Obstetrics and Gynecology

## 2016-10-04 ENCOUNTER — Other Ambulatory Visit: Payer: Self-pay

## 2016-10-04 ENCOUNTER — Ambulatory Visit: Payer: Medicaid Other | Admitting: Obstetrics and Gynecology

## 2016-10-22 ENCOUNTER — Telehealth: Payer: Self-pay

## 2016-10-22 NOTE — Telephone Encounter (Signed)
I spoke to patient and she stated the vaginal lesion is very painful to touch x few days. She has done a warm bath with no relief.. I explained AMS was out of the office today, she scheduled a follow up for next week. Advised if gets worse over weekend urgent care/ER would be best options.

## 2016-10-22 NOTE — Telephone Encounter (Signed)
Spoke with pt and advised that Dr. Bonney AidStaebler doesn't have any openings for next week and I could schedule her to see someone next week. Pt stated she only wanted to see Dr. Bonney AidStaebler and that she would wait until he had an opening. Pt is scheduled for 11/03/16 for US and OV. Thanks TNP

## 2016-10-22 NOTE — Telephone Encounter (Signed)
Pt calling AMS to find out what she can do for these painful skin lumps.. Pt states AMS told her this is normal but she's in pain please advise

## 2016-11-03 ENCOUNTER — Other Ambulatory Visit: Payer: Medicaid Other

## 2016-11-03 ENCOUNTER — Ambulatory Visit: Payer: Medicaid Other | Admitting: Obstetrics and Gynecology

## 2016-11-17 ENCOUNTER — Other Ambulatory Visit: Payer: Medicaid Other

## 2016-11-17 ENCOUNTER — Ambulatory Visit: Payer: Medicaid Other | Admitting: Obstetrics and Gynecology

## 2016-11-25 NOTE — Progress Notes (Deleted)
    Gynecology Ultrasound Follow Up  Chief Complaint: No chief complaint on file.    History of Present Illness: Patient is a 31 y.o. female who presents today for ultrasound evaluation of *** .  Ultrasound demonstrates the following findgins Adnexa: {adnexa findings ultrasound:314145} {Desc; adnexal mass:12229} Uterus: *** with endometrial stripe  {Numbers; 0-20:10930}.{Numbers; 0-20:10930} {Desc; mm/cm/in:30693} Additional: ***  Review of Systems: ROS  Past Medical History:  Past Medical History:  Diagnosis Date  . Arthritis   . Asthma   . Brain aneurysm   . Gallstones     Past Surgical History:  Past Surgical History:  Procedure Laterality Date  . CESAREAN SECTION     x3    Gynecologic History:  No LMP recorded. Contraception: {method:5051} Last Pap: ***. Results were: .{Findings; lab pap smear results:16707::"no abnormalities"}  Family History:  Family History  Problem Relation Age of Onset  . Hypertension Mother   . Alcohol abuse Mother   . Alcohol abuse Father   . COPD Father   . Emphysema Father   . Heart disease Father     Social History:  Social History   Social History  . Marital status: Single    Spouse name: N/A  . Number of children: N/A  . Years of education: N/A   Occupational History  . Not on file.   Social History Main Topics  . Smoking status: Current Every Day Smoker  . Smokeless tobacco: Never Used  . Alcohol use No  . Drug use: No  . Sexual activity: Yes    Partners: Male    Birth control/ protection: None   Other Topics Concern  . Not on file   Social History Narrative  . No narrative on file    Allergies:  Allergies  Allergen Reactions  . Neurontin [Gabapentin] Other (See Comments)    Does not remember anything  . Oxycodone Nausea And Vomiting    dizziness  . Codeine Hives  . Other Other (See Comments)    PT states she is allergic to any of the Cillins but also states that every time she is prescribed a new  antibiotic she adds it to the list.  PT has throat swelling and trouble breathing currently with all Cillins.     Medications: Prior to Admission medications   Medication Sig Start Date End Date Taking? Authorizing Provider  albuterol (PROVENTIL HFA;VENTOLIN HFA) 108 (90 Base) MCG/ACT inhaler Inhale 1-2 puffs into the lungs every 6 (six) hours as needed.  01/03/15   [provider]  cetirizine (ZYRTEC) 10 MG tablet Take 10 mg by mouth as needed. Reported on 09/05/2015 01/03/15 01/03/16  [provider]  EPIPEN 2-PAK 0.3 MG/0.3ML SOAJ injection INJECT INTO THIGH MUSCLE THROUGH CLOTHES AS NEEDED SEVERE ALLERGIC REACTION 01/29/15   [provider]    Physical Exam Vitals: There were no vitals taken for this visit.  General: NAD HEENT: normocephalic, anicteric Pulmonary: No increased work of breathing Extremities: no edema, erythema, or tenderness Neurologic: Grossly intact, normal gait Psychiatric: mood appropriate, affect full   Assessment: 31 y.o. G3P3 No problem-specific Assessment & Plan notes found for this encounter.   Plan: Problem List Items Addressed This Visit    None      1)

## 2016-11-26 ENCOUNTER — Ambulatory Visit: Payer: Medicaid Other | Admitting: Obstetrics and Gynecology

## 2016-11-26 ENCOUNTER — Other Ambulatory Visit: Payer: Medicaid Other

## 2016-12-14 ENCOUNTER — Ambulatory Visit (INDEPENDENT_AMBULATORY_CARE_PROVIDER_SITE_OTHER): Payer: Medicaid Other | Admitting: Obstetrics and Gynecology

## 2016-12-14 ENCOUNTER — Ambulatory Visit (INDEPENDENT_AMBULATORY_CARE_PROVIDER_SITE_OTHER): Payer: Medicaid Other

## 2016-12-14 VITALS — BP 112/62 | HR 107 | Ht 63.0 in | Wt 247.0 lb

## 2016-12-14 DIAGNOSIS — N930 Postcoital and contact bleeding: Secondary | ICD-10-CM

## 2016-12-14 DIAGNOSIS — Z3009 Encounter for other general counseling and advice on contraception: Secondary | ICD-10-CM | POA: Diagnosis not present

## 2016-12-14 DIAGNOSIS — L739 Follicular disorder, unspecified: Secondary | ICD-10-CM | POA: Diagnosis not present

## 2016-12-14 MED ORDER — CEPHALEXIN 500 MG PO CAPS: 500.0000 mg | ORAL_CAPSULE | Freq: Three times a day (TID) | ORAL | 0 refills | Status: DC

## 2016-12-14 NOTE — Progress Notes (Signed)
Gynecology Ultrasound Follow Up  Chief Complaint:  Chief Complaint  Patient presents with  . Follow-up    GYN U/S      History of Present Illness: Patient is a 31 y.o. female who presents today for ultrasound evaluation of postcoital spotting .  Ultrasound demonstrates the following findgins Adnexa: normal bilateral adnexa Uterus:Non-enlarged uterus, no fibroids, with endometrial stripe somewhat heterogenous and indistinct myometrial/endometrial interface consistent with possible adenomyosis (the patient does report dysmenorrhea) Additional: no free fluid  She states the previously noted periclitoral lesion   Review of Systems: Review of Systems  Constitutional: Negative for chills and fever.  HENT: Negative for congestion.   Respiratory: Negative for cough and shortness of breath.   Cardiovascular: Negative for chest pain and palpitations.  Gastrointestinal: Negative for abdominal pain, constipation, diarrhea, heartburn, nausea and vomiting.  Genitourinary: Negative for dysuria, frequency and urgency.  Skin: Negative for itching and rash.  Neurological: Negative for dizziness and headaches.  Endo/Heme/Allergies: Negative for polydipsia.  Psychiatric/Behavioral: Negative for depression.    Past Medical History:  Past Medical History:  Diagnosis Date  . Arthritis   . Asthma   . Brain aneurysm   . Gallstones     Past Surgical History:  Past Surgical History:  Procedure Laterality Date  . CESAREAN SECTION     x3    Gynecologic History:  Patient's last menstrual period was 12/14/2016.  Family History:  Family History  Problem Relation Age of Onset  . Hypertension Mother   . Alcohol abuse Mother   . Alcohol abuse Father   . COPD Father   . Emphysema Father   . Heart disease Father     Social History:  Social History   Social History  . Marital status: Single    Spouse name: N/A  . Number of children: N/A  . Years of education: N/A   Occupational  History  . Not on file.   Social History Main Topics  . Smoking status: Current Every Day Smoker  . Smokeless tobacco: Never Used  . Alcohol use No  . Drug use: No  . Sexual activity: Yes    Partners: Male    Birth control/ protection: None   Other Topics Concern  . Not on file   Social History Narrative  . No narrative on file    Allergies:  Allergies  Allergen Reactions  . Neurontin [Gabapentin] Other (See Comments)    Does not remember anything  . Oxycodone Nausea And Vomiting    dizziness  . Codeine Hives  . Other Other (See Comments)    PT states she is allergic to any of the Cillins but also states that every time she is prescribed a new antibiotic she adds it to the list.  PT has throat swelling and trouble breathing currently with all Cillins.     Medications: Prior to Admission medications   Medication Sig Start Date End Date Taking? Authorizing Provider  albuterol (PROVENTIL HFA;VENTOLIN HFA) 108 (90 Base) MCG/ACT inhaler Inhale 1-2 puffs into the lungs every 6 (six) hours as needed.  01/03/15   [provider]  cephALEXin (KEFLEX) 500 MG capsule Take 1 capsule (500 mg total) by mouth 3 (three) times daily. 12/14/16   Vena Austria, MD  cetirizine (ZYRTEC) 10 MG tablet Take 10 mg by mouth as needed. Reported on 09/05/2015 01/03/15 01/03/16  [provider]  EPIPEN 2-PAK 0.3 MG/0.3ML SOAJ injection INJECT INTO THIGH MUSCLE THROUGH CLOTHES AS NEEDED SEVERE ALLERGIC REACTION 01/29/15  [provider]    Physical Exam Vitals: Blood pressure 112/62, pulse (!) 107, height 5\' 3"  (1.6 m), weight 247 lb (112 kg), last menstrual period 12/14/2016.  General: NAD HEENT: normocephalic, anicteric Pulmonary: No increased work of breathing Extremities: no edema, erythema, or tenderness GU: normal external female genitalia, several smal areas consistent with folliculitis on internal thigh and vulva Neurologic: Grossly intact, normal  gait Psychiatric: mood appropriate, affect full   Assessment: 31 y.o. G3P3 No problem-specific Assessment & Plan notes found for this encounter.   Plan: Problem List Items Addressed This Visit    None    Visit Diagnoses    Folliculitis    -  Primary   Postcoital bleeding       Counseling for birth control regarding intrauterine device (IUD)          1) Postcoital spotting - still intermitten, aptima done last visit I do not see results.  Will repeat at next visit with IUD placement. Patient plans IUD, and will schedule soon.  Risks and benefits of IUD discussed including the risks of irregular bleeding, cramping, infection, malpositioning, expulsion or uterine perforation of the IUD (1:1000 placements)  which may require further procedures such as laparoscopy.  IUDs while effective at preventing pregnancy do not prevent transmission of sexually transmitted diseases and use of barrier methods for this purpose was discussed.  Low overall incidence of failure with 99.7% efficacy rate in typical use.  The patient has not contraindication to IUD placement.  2) Foliculitis - rx keflex, avoid shaving consider hair removal product as opposed to shaving.    3) A total of 15 minutes were spent in face-to-face contact with the patient during this encounter with over half of that time devoted to counseling and coordination of care.           Foliculitis rx keflex To call with next menses for mirena structurally normal uterus possible adenomyosis

## 2017-01-03 ENCOUNTER — Telehealth: Payer: Self-pay

## 2017-01-03 NOTE — Telephone Encounter (Signed)
Pt thinks she is having complications with antibx AMS gave her.  Please call.  [phone]

## 2017-01-03 NOTE — Telephone Encounter (Signed)
Vaginal area itches/beat red/raw/burning. Pt also, states she did not had a cycle at the end of the month but had it 2 weeks earlier, just had S&S.

## 2017-01-03 NOTE — Telephone Encounter (Signed)
Sounds like a yeast infection but could be any number of things.  She can try OTC monistat if that does not improve symptoms I'll probably need to evalute her in person

## 2017-01-04 NOTE — Telephone Encounter (Signed)
Pt is calling back . Please advise

## 2017-01-04 NOTE — Telephone Encounter (Signed)
Message left for patient to call me back.

## 2017-01-04 NOTE — Telephone Encounter (Signed)
Pt aware of AMS message 

## 2017-03-15 ENCOUNTER — Ambulatory Visit: Payer: Medicaid Other | Admitting: Obstetrics and Gynecology

## 2017-03-31 ENCOUNTER — Ambulatory Visit: Payer: Medicaid Other | Admitting: Obstetrics and Gynecology

## 2017-04-08 DIAGNOSIS — M47816 Spondylosis without myelopathy or radiculopathy, lumbar region: Secondary | ICD-10-CM | POA: Insufficient documentation

## 2017-05-02 ENCOUNTER — Encounter: Payer: Self-pay | Admitting: Obstetrics and Gynecology

## 2017-05-02 ENCOUNTER — Ambulatory Visit: Payer: Medicaid Other | Admitting: Obstetrics and Gynecology

## 2017-05-02 VITALS — BP 102/70 | HR 107 | Ht 63.0 in | Wt 243.0 lb

## 2017-05-02 DIAGNOSIS — Z113 Encounter for screening for infections with a predominantly sexual mode of transmission: Secondary | ICD-10-CM | POA: Diagnosis not present

## 2017-05-02 DIAGNOSIS — N946 Dysmenorrhea, unspecified: Secondary | ICD-10-CM | POA: Diagnosis not present

## 2017-05-02 DIAGNOSIS — N941 Unspecified dyspareunia: Secondary | ICD-10-CM | POA: Diagnosis not present

## 2017-05-02 DIAGNOSIS — L02439 Carbuncle of limb, unspecified: Secondary | ICD-10-CM

## 2017-05-02 DIAGNOSIS — L02429 Furuncle of limb, unspecified: Secondary | ICD-10-CM

## 2017-05-02 DIAGNOSIS — N939 Abnormal uterine and vaginal bleeding, unspecified: Secondary | ICD-10-CM

## 2017-05-02 MED ORDER — NAPROXEN 500 MG PO TABS: 500.0000 mg | ORAL_TABLET | Freq: Two times a day (BID) | ORAL | 2 refills | Status: DC

## 2017-05-02 MED ORDER — NORETHINDRONE ACETATE 5 MG PO TABS: 5.0000 mg | ORAL_TABLET | Freq: Every day | ORAL | 11 refills | Status: DC

## 2017-05-02 NOTE — Progress Notes (Signed)
Obstetrics & Gynecology Office Visit   Chief Complaint:  Chief Complaint  Patient presents with  . vaginal lump    x2 days  . Menorrhagia    History of Present Illness: 32 year old [id number] with abnormal uterine bleeding.  She has had prior evaluation with normal TVUS in the last 6 months.  She continues to also report significant dysmenorrhea and dyspareunia.  Her pelvic pain is exacerbated during menses but it is present throughout the month.  Left lower quadrant more noticeable than right lower quadrant.  12/10 per patient.  Some radiation into groin.  We had previously discussed trial of Mirena IUD but she declines secondary to her sister having had complications with hers.  She denies vaginal discharge.  As far as diagnostic laparoscopy is concerned the patient states because of an brain aneurysm she is unable to undergo anesthesia.  She has had conflicting reports on this an is currently awaiting a second opinion.  She has been taking naproxen with partial relief of symptoms.   Review of Systems: Review of Systems  Constitutional: Negative for chills and fever.  Gastrointestinal: Positive for abdominal pain. Negative for blood in stool, constipation, diarrhea, nausea and vomiting.  Genitourinary: Negative for dysuria, frequency, hematuria and urgency.  Endo/Heme/Allergies: Does not bruise/bleed easily.     Past Medical History:  Past Medical History:  Diagnosis Date  . Arthritis   . Asthma   . Brain aneurysm   . Gallstones     Past Surgical History:  Past Surgical History:  Procedure Laterality Date  . CESAREAN SECTION     x3    Gynecologic History: Patient's last menstrual period was 05/01/2017 (exact date).  Obstetric History: [id number]  Family History:  Family History  Problem Relation Age of Onset  . Hypertension Mother   . Alcohol abuse Mother   . Alcohol abuse Father   . COPD Father   . Emphysema Father   . Heart disease Father     Social History:    Social History   Socioeconomic History  . Marital status: Single    Spouse name: Not on file  . Number of children: Not on file  . Years of education: Not on file  . Highest education level: Not on file  Social Needs  . Financial resource strain: Not on file  . Food insecurity - worry: Not on file  . Food insecurity - inability: Not on file  . Transportation needs - medical: Not on file  . Transportation needs - non-medical: Not on file  Occupational History  . Not on file  Tobacco Use  . Smoking status: Current Every Day Smoker  . Smokeless tobacco: Never Used  Substance and Sexual Activity  . Alcohol use: No    Alcohol/week: 0.0 oz  . Drug use: No  . Sexual activity: Yes    Partners: Male    Birth control/protection: None  Other Topics Concern  . Not on file  Social History Narrative  . Not on file    Allergies:  Allergies  Allergen Reactions  . Neurontin [Gabapentin] Other (See Comments)    Does not remember anything  . Oxycodone Nausea And Vomiting    dizziness  . Codeine Hives  . Other Other (See Comments)    PT states she is allergic to any of the Cillins but also states that every time she is prescribed a new antibiotic she adds it to the list.  PT has throat swelling and trouble breathing  currently with all Cillins.     Medications: Prior to Admission medications   Medication Sig Start Date End Date Taking? Authorizing Provider  albuterol (PROVENTIL HFA;VENTOLIN HFA) 108 (90 Base) MCG/ACT inhaler Inhale 1-2 puffs into the lungs every 6 (six) hours as needed.  01/03/15  Yes [provider]  EPIPEN 2-PAK 0.3 MG/0.3ML SOAJ injection INJECT INTO THIGH MUSCLE THROUGH CLOTHES AS NEEDED SEVERE ALLERGIC REACTION 01/29/15  Yes [provider]  naproxen (NAPROSYN) 500 MG tablet Take 1 tablet (500 mg total) by mouth 2 (two) times daily with a meal. As needed for pain 05/02/17   Vena Austria, MD  norethindrone (AYGESTIN) 5 MG tablet Take 1 tablet  (5 mg total) by mouth daily. 05/02/17   Vena Austria, MD    Physical Exam Vitals:  Vitals:   05/02/17 1407  BP: 102/70  Pulse: (!) 107   Patient's last menstrual period was 05/01/2017 (exact date).  General: NAD HEENT: normocephalic, anicteric Pulmonary: No increased work of breathing Abdomen: NABS, soft, non-tender, non-distended.  Umbilicus without lesions.  No hepatomegaly, splenomegaly or masses palpable. No evidence of hernia  Genitourinary:  External: Normal external female genitalia.  Normal urethral meatus, normal Bartholin's and Skene's glands.    Vagina: Normal vaginal mucosa, no evidence of prolapse.    Cervix: Grossly normal in appearance, no bleeding  Uterus: Non-enlarged, mobile, normal contour.  No CMT  Adnexa: ovaries non-enlarged, no adnexal masses  Rectal: deferred  Lymphatic: no evidence of inguinal lymphadenopathy Extremities: no edema, erythema, or tenderness.  Small boil right thigh, no fluctuance, mild induration and erythema Neurologic: Grossly intact Psychiatric: mood appropriate, affect full  Female chaperone present for pelvic  portions of the physical exam  Assessment: 32 y.o. [phone] with abnormal uterine bleeding  Plan: Problem List Items Addressed This Visit    None    Visit Diagnoses    Routine screening for STI (sexually transmitted infection)    -  Primary   Relevant Orders   GC/Chlamydia Probe Amp([facility])   Boil, thigh       Relevant Medications   clindamycin (CLEOCIN) 300 MG capsule   Abnormal uterine bleeding       Dysmenorrhea       Dyspareunia in female          - GC/CT sent - Naproxen refill - Small boil right leg started on clindamycin - Follow up in 3 months to assess response.  We did discuss orlissa as an alternative  - Start norethindrone - A total of 15 minutes were spent in face-to-face contact with the patient during this encounter with over half of that time devoted to counseling and coordination of care.

## 2017-05-03 ENCOUNTER — Telehealth: Payer: Self-pay | Admitting: Obstetrics and Gynecology

## 2017-05-03 ENCOUNTER — Other Ambulatory Visit: Payer: Self-pay | Admitting: Obstetrics and Gynecology

## 2017-05-03 MED ORDER — CLINDAMYCIN HCL 300 MG PO CAPS: 300.0000 mg | ORAL_CAPSULE | Freq: Two times a day (BID) | ORAL | 0 refills | Status: AC

## 2017-05-03 NOTE — Telephone Encounter (Signed)
Rx sent 

## 2017-05-03 NOTE — Telephone Encounter (Signed)
Pt called today stating that her dentist was supposed to call in an antibiotic but they did not and AMS was waiting for them to call it in. Pt requests AMS to send in rx. Cb# [phone] thank you

## 2017-05-03 NOTE — Telephone Encounter (Signed)
I called patient and told her AMS is out of the office until Thursday. Advised her to call her dentist and get them to call in the antibiotic. Pt states AMS told her yesterday for us to call him/page him today and he would call it in. Pt advised AMS may not get his messages today.   [facility] [address]

## 2017-05-04 LAB — GC/CHLAMYDIA PROBE AMP
CHLAMYDIA, DNA PROBE: NEGATIVE
NEISSERIA GONORRHOEAE BY PCR: NEGATIVE

## 2017-05-16 ENCOUNTER — Ambulatory Visit: Payer: Medicaid Other | Admitting: Obstetrics and Gynecology

## 2017-05-30 DIAGNOSIS — M5489 Other dorsalgia: Secondary | ICD-10-CM | POA: Insufficient documentation

## 2017-05-30 DIAGNOSIS — G43009 Migraine without aura, not intractable, without status migrainosus: Secondary | ICD-10-CM | POA: Insufficient documentation

## 2017-05-30 DIAGNOSIS — G44229 Chronic tension-type headache, not intractable: Secondary | ICD-10-CM | POA: Insufficient documentation

## 2017-05-30 DIAGNOSIS — R413 Other amnesia: Secondary | ICD-10-CM

## 2017-05-30 HISTORY — DX: Other amnesia: R41.3

## 2017-08-01 ENCOUNTER — Ambulatory Visit: Payer: Medicaid Other | Admitting: Obstetrics and Gynecology

## 2017-08-01 ENCOUNTER — Ambulatory Visit: Payer: Self-pay | Admitting: Obstetrics and Gynecology

## 2017-08-17 ENCOUNTER — Encounter: Payer: Self-pay | Admitting: Obstetrics and Gynecology

## 2017-08-17 ENCOUNTER — Ambulatory Visit (INDEPENDENT_AMBULATORY_CARE_PROVIDER_SITE_OTHER): Payer: Medicaid Other | Admitting: Obstetrics and Gynecology

## 2017-08-17 VITALS — BP 132/80 | HR 101 | Ht 63.0 in | Wt 236.0 lb

## 2017-08-17 DIAGNOSIS — N939 Abnormal uterine and vaginal bleeding, unspecified: Secondary | ICD-10-CM

## 2017-08-17 DIAGNOSIS — R300 Dysuria: Secondary | ICD-10-CM

## 2017-08-17 DIAGNOSIS — Z124 Encounter for screening for malignant neoplasm of cervix: Secondary | ICD-10-CM

## 2017-08-17 DIAGNOSIS — Z1239 Encounter for other screening for malignant neoplasm of breast: Secondary | ICD-10-CM

## 2017-08-17 DIAGNOSIS — G8929 Other chronic pain: Secondary | ICD-10-CM

## 2017-08-17 DIAGNOSIS — R102 Pelvic and perineal pain: Secondary | ICD-10-CM

## 2017-08-17 DIAGNOSIS — Z01419 Encounter for gynecological examination (general) (routine) without abnormal findings: Secondary | ICD-10-CM

## 2017-08-17 DIAGNOSIS — Z0000 Encounter for general adult medical examination without abnormal findings: Secondary | ICD-10-CM | POA: Diagnosis not present

## 2017-08-17 LAB — POCT URINALYSIS DIPSTICK
Bilirubin, UA: NEGATIVE
Blood, UA: NEGATIVE
Glucose, UA: NEGATIVE
Ketones, UA: NEGATIVE
Leukocytes, UA: NEGATIVE
NITRITE UA: NEGATIVE
PROTEIN UA: NEGATIVE
Spec Grav, UA: 1.01 (ref 1.010–1.025)
Urobilinogen, UA: NEGATIVE E.U./dL — AB
pH, UA: 6 (ref 5.0–8.0)

## 2017-08-17 NOTE — Progress Notes (Signed)
Patient ID: Elizabeth Spencer, female   DOB: 1985/06/13, 32 y.o.   MRN: [id number]     Gynecology Annual Exam   PCP: [facility]  Chief Complaint:  Chief Complaint  Patient presents with  . Gynecologic Exam    medcation follow up    History of Present Illness: Patient is a 32 y.o. G3P3003 presents for annual exam. The patient has no complaints today.   LMP: No LMP recorded. Has done well since starting norethindrone in January from pain standpoint but has had continued breakthrough bleeding over the past few months.  The patient is sexually active. She currently uses oral progesterone-only contraceptive for contraception. She has dyspareunia.  There is no notable family history of breast or ovarian cancer in her family.  The patient wears seatbelts: yes.   The patient has regular exercise: no.    The patient denies current symptoms of depression.    Review of Systems: ROS  Past Medical History:  Past Medical History:  Diagnosis Date  . Arthritis   . Asthma   . Brain aneurysm   . Gallstones     Past Surgical History:  Past Surgical History:  Procedure Laterality Date  . CESAREAN SECTION     x3    Gynecologic History:  No LMP recorded. Contraception: oral progesterone-only contraceptive  Obstetric History: G3P3003  Family History:  Family History  Problem Relation Age of Onset  . Hypertension Mother   . Alcohol abuse Mother   . Alcohol abuse Father   . COPD Father   . Emphysema Father   . Heart disease Father     Social History:  Social History   Socioeconomic History  . Marital status: Single    Spouse name: Not on file  . Number of children: Not on file  . Years of education: Not on file  . Highest education level: Not on file  Occupational History  . Not on file  Social Needs  . Financial resource strain: Not on file  . Food insecurity:    Worry: Not on file    Inability: Not on file  . Transportation needs:    Medical: Not on file   Non-medical: Not on file  Tobacco Use  . Smoking status: Current Every Day Smoker  . Smokeless tobacco: Never Used  Substance and Sexual Activity  . Alcohol use: No    Alcohol/week: 0.0 oz  . Drug use: No  . Sexual activity: Yes    Partners: Male    Birth control/protection: None  Lifestyle  . Physical activity:    Days per week: Not on file    Minutes per session: Not on file  . Stress: Not on file  Relationships  . Social connections:    Talks on phone: Not on file    Gets together: Not on file    Attends religious service: Not on file    Active member of club or organization: Not on file    Attends meetings of clubs or organizations: Not on file    Relationship status: Not on file  . Intimate partner violence:    Fear of current or ex partner: Not on file    Emotionally abused: Not on file    Physically abused: Not on file    Forced sexual activity: Not on file  Other Topics Concern  . Not on file  Social History Narrative  . Not on file    Allergies:  Allergies  Allergen Reactions  . Neurontin [Gabapentin] Other (  See Comments)    Does not remember anything  . Oxycodone Nausea And Vomiting    dizziness  . Codeine Hives  . Other Other (See Comments)    PT states she is allergic to any of the Cillins but also states that every time she is prescribed a new antibiotic she adds it to the list.  PT has throat swelling and trouble breathing currently with all Cillins.     Medications: Prior to Admission medications   Medication Sig Start Date End Date Taking? Authorizing Provider  albuterol (PROVENTIL HFA;VENTOLIN HFA) 108 (90 Base) MCG/ACT inhaler Inhale 1-2 puffs into the lungs every 6 (six) hours as needed.  01/03/15  Yes [provider]  cetirizine (ZYRTEC) 10 MG tablet Take by mouth. 03/11/17 03/11/18 Yes [provider]  EPIPEN 2-PAK 0.3 MG/0.3ML SOAJ injection INJECT INTO THIGH MUSCLE THROUGH CLOTHES AS NEEDED SEVERE ALLERGIC REACTION 01/29/15   Yes [provider]  ketoconazole (NIZORAL) 2 % shampoo SHAMPOO DAILY,AFTER UNDER CONTROL 3 TIMES WEEKLY.APPLY & LATHER-LEAVE ON FOR BEFORE WASHING OFF 06/27/17  Yes [provider]  naproxen (NAPROSYN) 500 MG tablet Take 1 tablet (500 mg total) by mouth 2 (two) times daily with a meal. As needed for pain 05/02/17  Yes Vena Austria, MD  nicotine polacrilex (COMMIT) 4 MG lozenge APPLY 1 LOZENGE (4 MG TOTAL) TO CHEEK EVERY TWO (2) HOURS. 08/03/17  Yes [provider]  norethindrone (AYGESTIN) 5 MG tablet Take 1 tablet (5 mg total) by mouth daily. 05/02/17  Yes Vena Austria, MD  omeprazole (PRILOSEC) 20 MG capsule Take by mouth. 03/11/17  Yes [provider]  RETIN-A 0.025 % cream APPLY PEA SIZE AMOUNT TO FACE AT NIGHT 06/27/17  Yes [provider]  triamcinolone cream (KENALOG) 0.1 % APPLY TO AFFECTED AREA TWICE A DAY 11/01/16  Yes [provider]    Physical Exam Vitals: Blood pressure 132/80, pulse (!) 101, height  (1.6 m), weight 236 lb (107 kg).  General: NAD HEENT: normocephalic, anicteric Thyroid: no enlargement, no palpable nodules Pulmonary: No increased work of breathing, CTAB Cardiovascular: RRR, distal pulses 2+ Breast: Breast symmetrical, no tenderness, no palpable nodules or masses, no skin or nipple retraction present, no nipple discharge.  No axillary or supraclavicular lymphadenopathy. Abdomen: NABS, soft, non-tender, non-distended.  Umbilicus without lesions.  No hepatomegaly, splenomegaly or masses palpable. No evidence of hernia  Genitourinary:  External: Normal external female genitalia.  Normal urethral meatus, normal Bartholin's and Skene's glands.    Vagina: Normal vaginal mucosa, no evidence of prolapse.    Cervix: Grossly normal in appearance, no bleeding  Uterus: Non-enlarged, mobile, normal contour.  No CMT  Adnexa: ovaries non-enlarged, no adnexal masses  Rectal: deferred  Lymphatic: no evidence of  inguinal lymphadenopathy Extremities: no edema, erythema, or tenderness Neurologic: Grossly intact Psychiatric: mood appropriate, affect full  Female chaperone present for pelvic and breast  portions of the physical exam    Assessment: 32 y.o. G3P3003 routine annual exam  Plan: Problem List Items Addressed This Visit    None    Visit Diagnoses    Dysuria    -  Primary   Relevant Orders   POCT urinalysis dipstick (Completed)   Screening for malignant neoplasm of cervix       Relevant Orders   PapIG, HPV, rfx 16/18   Breast screening       Encounter for gynecological examination without abnormal finding       Relevant Orders   PapIG, HPV,  rfx 16/18      2) STI screening  was notoffered and therefore not obtained  2)  ASCCP guidelines and rational discussed.  Patient opts for every 3 years screening interval  3) Contraception - the patient is currently using  oral progesterone-only contraceptive.  She is happy with her current form of contraception and plans to continue  4) Routine healthcare maintenance including cholesterol, diabetes screening discussed managed by PCP  5) Chronic pelvic pain and AUB - post for diagnostic laparoscopy, hysteroscopy D&C  6) Return in 1 year (on 08/18/2018).   Vena Austria, MD, [facility], [facility] 08/17/2017, 2:52 PM

## 2017-08-19 ENCOUNTER — Telehealth: Payer: Self-pay | Admitting: Obstetrics and Gynecology

## 2017-08-19 NOTE — Telephone Encounter (Signed)
Patient is aware of H&P on day of surgery, Pre-admit Testing phone interview to be scheduled, and OR on 09/01/17. Patient is aware she may receive calls from the [facility] and [facility]. Patient was offered MyChart but doesn't feel comfortable receiving messages on her cell phone. Ext given.

## 2017-08-19 NOTE — Telephone Encounter (Signed)
-----   Message from Vena Austria, MD sent at 08/19/2017  1:29 PM EDT ----- Regarding: surgery Surgery Date: 1-4 weeks  LOS: same day surgery  Surgery Booking Request Patient Full Name: Elizabeth Spencer MRN: [id number]  DOB: [birth year]  Surgeon: Vena Austria, MD  Requested Surgery Date and Time: 1-4 weeks Primary Diagnosis and Code: Chronic pelvic pain Secondary Diagnosis and Code: AUB Surgical Procedure: diagnostic laparoscopy, hysteroscopy, D&C L&D Notification:N/A Admission Status: same day surgery Length of Surgery: 1h Special Case Needs: none H&P: day of or week of (date) Phone Interview or Office Pre-Admit: phone interview Interpreter: No Language: English Medical Clearance: No Special Scheduling Instructions:

## 2017-08-20 LAB — PAPIG, HPV, RFX 16/18
HPV, HIGH-RISK: NEGATIVE
PAP SMEAR COMMENT: 0

## 2017-08-22 ENCOUNTER — Emergency Department: Payer: Medicaid Other

## 2017-08-22 ENCOUNTER — Other Ambulatory Visit: Payer: Self-pay

## 2017-08-22 ENCOUNTER — Encounter: Payer: Self-pay | Admitting: Emergency Medicine

## 2017-08-22 ENCOUNTER — Emergency Department
Admission: EM | Admit: 2017-08-22 | Discharge: 2017-08-22 | Disposition: A | Discharge status: Home / Self Care | Payer: Medicaid Other | Attending: Emergency Medicine | Admitting: Emergency Medicine

## 2017-08-22 DIAGNOSIS — F17200 Nicotine dependence, unspecified, uncomplicated: Secondary | ICD-10-CM | POA: Diagnosis not present

## 2017-08-22 DIAGNOSIS — Z79899 Other long term (current) drug therapy: Secondary | ICD-10-CM | POA: Insufficient documentation

## 2017-08-22 DIAGNOSIS — R2241 Localized swelling, mass and lump, right lower limb: Secondary | ICD-10-CM | POA: Diagnosis present

## 2017-08-22 DIAGNOSIS — R609 Edema, unspecified: Secondary | ICD-10-CM | POA: Diagnosis not present

## 2017-08-22 DIAGNOSIS — R600 Localized edema: Secondary | ICD-10-CM

## 2017-08-22 DIAGNOSIS — J45909 Unspecified asthma, uncomplicated: Secondary | ICD-10-CM | POA: Insufficient documentation

## 2017-08-22 LAB — PROTIME-INR
INR: 0.97
Prothrombin Time: 12.8 seconds (ref 11.4–15.2)

## 2017-08-22 LAB — BASIC METABOLIC PANEL
ANION GAP: 6 (ref 5–15)
BUN: 11 mg/dL (ref 6–20)
CHLORIDE: 110 mmol/L (ref 101–111)
CO2: 20 mmol/L — ABNORMAL LOW (ref 22–32)
CREATININE: 0.53 mg/dL (ref 0.44–1.00)
Calcium: 8.8 mg/dL — ABNORMAL LOW (ref 8.9–10.3)
GFR calc non Af Amer: >60 mL/min (ref >60)
Glucose, Bld: 87 mg/dL (ref 65–99)
POTASSIUM: 3.9 mmol/L (ref 3.5–5.1)
SODIUM: 136 mmol/L (ref 135–145)

## 2017-08-22 LAB — CBC WITH DIFFERENTIAL/PLATELET
Basophils Absolute: 0.1 10*3/uL (ref 0–0.1)
Basophils Relative: 1 %
EOS ABS: 0.3 10*3/uL (ref 0–0.7)
Eosinophils Relative: 3 %
HEMATOCRIT: 39.8 % (ref 35.0–47.0)
HEMOGLOBIN: 13.5 g/dL (ref 12.0–16.0)
Lymphocytes Relative: 27 %
Lymphs Abs: 2.7 10*3/uL (ref 1.0–3.6)
MCH: 30 pg (ref 26.0–34.0)
MCHC: 34 g/dL (ref 32.0–36.0)
MCV: 88.4 fL (ref 80.0–100.0)
Monocytes Absolute: 0.7 10*3/uL (ref 0.2–0.9)
Monocytes Relative: 7 %
NEUTROS ABS: 6.4 10*3/uL (ref 1.4–6.5)
NEUTROS PCT: 62 %
Platelets: 207 10*3/uL (ref 150–440)
RBC: 4.5 MIL/uL (ref 3.80–5.20)
RDW: 13.2 % (ref 11.5–14.5)
WBC: 10.1 10*3/uL (ref 3.6–11.0)

## 2017-08-22 LAB — URIC ACID: URIC ACID, SERUM: 3.8 mg/dL (ref 2.3–6.6)

## 2017-08-22 LAB — SEDIMENTATION RATE: SED RATE: 5 mm/h (ref 0–20)

## 2017-08-22 NOTE — ED Notes (Signed)
See triage note  Developed some swelling and pressure to right foot  Denies any injury  Ambulates with sl limp  States she noticed swelling this am

## 2017-08-22 NOTE — ED Triage Notes (Signed)
Says foot swelling started yesterday. Right. No injury. No pain, just pressure due to unable to bend.

## 2017-08-22 NOTE — ED Provider Notes (Signed)
[facility] Emergency Department Provider Note   ____________________________________________   First MD Initiated Contact with Patient 08/22/17 [id number]     (approximate)  I have reviewed the triage vital signs and the nursing notes.   HISTORY  Chief Complaint Foot Pain    HPI Loney Peto is a 32 y.o. female patient complain increase right foot swelling which started yesterday.  Patient denies provocative incident for complaint.  Patient states pressure which decreases flexion and extension of the foot.  Patient states she noticed bruising to the medial aspect of the ankle last week which seems to be improving.  Patient state she bruises easily.  Past Medical History:  Diagnosis Date  . Arthritis   . Asthma   . Brain aneurysm   . Gallstones     There are no active problems to display for this patient.   Past Surgical History:  Procedure Laterality Date  . CESAREAN SECTION     x3    Prior to Admission medications   Medication Sig Start Date End Date Taking? Authorizing Provider  albuterol (PROVENTIL HFA;VENTOLIN HFA) 108 (90 Base) MCG/ACT inhaler Inhale 1-2 puffs into the lungs every 6 (six) hours as needed.  01/03/15   [provider]  cetirizine (ZYRTEC) 10 MG tablet Take by mouth. 03/11/17 03/11/18  [provider]  EPIPEN 2-PAK 0.3 MG/0.3ML SOAJ injection INJECT INTO THIGH MUSCLE THROUGH CLOTHES AS NEEDED SEVERE ALLERGIC REACTION 01/29/15   [provider]  ketoconazole (NIZORAL) 2 % shampoo SHAMPOO DAILY,AFTER UNDER CONTROL 3 TIMES WEEKLY.APPLY & LATHER-LEAVE ON FOR BEFORE WASHING OFF 06/27/17   [provider]  naproxen (NAPROSYN) 500 MG tablet Take 1 tablet (500 mg total) by mouth 2 (two) times daily with a meal. As needed for pain 05/02/17   Vena Austria, MD  nicotine polacrilex (COMMIT) 4 MG lozenge APPLY 1 LOZENGE (4 MG TOTAL) TO CHEEK EVERY TWO (2) HOURS. 08/03/17   [provider]    norethindrone (AYGESTIN) 5 MG tablet Take 1 tablet (5 mg total) by mouth daily. 05/02/17   Vena Austria, MD  omeprazole (PRILOSEC) 20 MG capsule Take by mouth. 03/11/17   [provider]  RETIN-A 0.025 % cream APPLY PEA SIZE AMOUNT TO FACE AT NIGHT 06/27/17   [provider]  triamcinolone cream (KENALOG) 0.1 % APPLY TO AFFECTED AREA TWICE A DAY 11/01/16   [provider]    Allergies Neurontin [gabapentin]; Oxycodone; Penicillins; Codeine; and Other  Family History  Problem Relation Age of Onset  . Hypertension Mother   . Alcohol abuse Mother   . Alcohol abuse Father   . COPD Father   . Emphysema Father   . Heart disease Father     Social History Social History   Tobacco Use  . Smoking status: Current Every Day Smoker  . Smokeless tobacco: Never Used  Substance Use Topics  . Alcohol use: No    Alcohol/week: 0.0 oz  . Drug use: No    Review of Systems Constitutional: No fever/chills Eyes: No visual changes. ENT: No sore throat. Cardiovascular: Denies chest pain. Respiratory: Denies shortness of breath. Gastrointestinal: No abdominal pain.  No nausea, no vomiting.  No diarrhea.  No constipation. Genitourinary: Negative for dysuria. Musculoskeletal: Edema to right foot and ankle. Skin: Negative for rash.  Bruising to right ankle. Neurological: Negative for headaches, focal weakness or numbness. Allergic/Immunilogical: See medication list ____________________________________________   PHYSICAL EXAM:  VITAL SIGNS: ED Triage Vitals [08/22/17 0926]  Enc Vitals Group  BP      Pulse      Resp      Temp      Temp src      SpO2      Weight 236 lb (107 kg)     Height  (1.6 m)     Head Circumference      Peak Flow      Pain Score 0     Pain Loc      Pain Edu?      Excl. in GC?    Constitutional: Alert and oriented. Well appearing and in no acute distress. Neck: No stridor. Hematological/Lymphatic/Immunilogical: No cervical  lymphadenopathy. Cardiovascular: Normal rate, regular rhythm. Grossly normal heart sounds.  Good peripheral circulation. Respiratory: Normal respiratory effort.  No retractions. Lungs CTAB. Gastrointestinal: Soft and nontender. No distention. No abdominal bruits. No CVA tenderness. Musculoskeletal: Bilateral mild edema lower extremities.  No joint effusions. Neurologic:  Normal speech and language. No gross focal neurologic deficits are appreciated. No gait instability. Skin:  Skin is warm, dry and intact. No rash noted. Psychiatric: Mood and affect are normal. Speech and behavior are normal.  ____________________________________________   LABS (all labs ordered are listed, but only abnormal results are displayed)  Labs Reviewed  BASIC METABOLIC PANEL - Abnormal; Notable for the following components:      Result Value   CO2 20 (*)    Calcium 8.8 (*)    All other components within normal limits  CBC WITH DIFFERENTIAL/PLATELET  SEDIMENTATION RATE  URIC ACID  PROTIME-INR   ____________________________________________  EKG   ____________________________________________  RADIOLOGY  No acute findings on x-ray of the right ankle.  Official radiology report(s): Dg Ankle Complete Right  Result Date: 08/22/2017 CLINICAL DATA:  Acute right foot swelling without known injury. EXAM: RIGHT ANKLE - COMPLETE 3+ VIEW COMPARISON:  None. FINDINGS: There is no evidence of fracture, dislocation, or joint effusion. There is no evidence of arthropathy or other focal bone abnormality. Soft tissues are unremarkable. IMPRESSION: Normal right ankle. Electronically Signed   By: Lupita Raider, M.D.   On: 08/22/2017 10:13    ____________________________________________   PROCEDURES  Procedure(s) performed: None  Procedures  Critical Care performed: No  ____________________________________________   INITIAL IMPRESSION / ASSESSMENT AND PLAN / ED COURSE  As part of my medical decision  making, I reviewed the following data within the electronic MEDICAL RECORD NUMBER    Patient presents with complain of edema bilateral ankles right greater than left.  Discussed negative labs and x-ray results with patient.  Patient advised to follow-up PCP for continued care.      ____________________________________________   FINAL CLINICAL IMPRESSION(S) / ED DIAGNOSES  Final diagnoses:  Mild peripheral edema     ED Discharge Orders    None       Note:  This document was prepared using Dragon voice recognition software and may include unintentional dictation errors.    Joni Reining, PA-C 08/22/17 1123    Schaevitz, Myra Rude, MD 08/22/17 1328

## 2017-08-25 ENCOUNTER — Encounter
Admission: RE | Admit: 2017-08-25 | Discharge: 2017-08-25 | Disposition: A | Discharge status: Home / Self Care | Payer: Medicaid Other | Source: Ambulatory Visit | Attending: Obstetrics and Gynecology | Admitting: Obstetrics and Gynecology

## 2017-08-25 ENCOUNTER — Other Ambulatory Visit: Payer: Self-pay

## 2017-08-25 HISTORY — DX: Personal history of other specified conditions: Z87.898

## 2017-08-25 HISTORY — DX: Post-traumatic stress disorder, unspecified: F43.10

## 2017-08-25 HISTORY — DX: Disorder of thyroid, unspecified: E07.9

## 2017-08-25 HISTORY — DX: Headache, unspecified: R51.9

## 2017-08-25 HISTORY — DX: Other complications of anesthesia, initial encounter: T88.59XA

## 2017-08-25 HISTORY — DX: Personal history of other diseases of the respiratory system: Z87.09

## 2017-08-25 HISTORY — DX: Headache: R51

## 2017-08-25 HISTORY — DX: Gastro-esophageal reflux disease without esophagitis: K21.9

## 2017-08-25 HISTORY — DX: Adverse effect of unspecified anesthetic, initial encounter: T41.45XA

## 2017-08-25 NOTE — Patient Instructions (Signed)
Your procedure is scheduled on: 09-01-17  Report to Same Day Surgery 2nd floor medical mall [facility] Entrance-take elevator on left to 2nd floor.  Check in with surgery information desk.) To find out your arrival time please call [phone] between 1PM - 3PM on 08-31-17  Remember: Instructions that are not followed completely may result in serious medical risk, up to and including death, or upon the discretion of your surgeon and anesthesiologist your surgery may need to be rescheduled.    _x___ 1. Do not eat food after midnight the night before your procedure. NO GUM OR CANDY AFTER MIDNIGHT.  You may drink clear liquids up to 2 hours before you are scheduled to arrive at the hospital for your procedure.  Do not drink clear liquids within 2 hours of your scheduled arrival to the hospital.  Clear liquids include  --Water or Apple juice without pulp  --Clear carbohydrate beverage such as ClearFast or Gatorade  --Black Coffee or Clear Tea (No milk, no creamers, do not add anything to the coffee or Tea     __x__ 2. No Alcohol for 24 hours before or after surgery.   __x__3. No Smoking or e-cigarettes for 24 prior to surgery.  Do not use any chewable tobacco products for at least 6 hour prior to surgery   ____  4. Bring all medications with you on the day of surgery if instructed.    __x__ 5. Notify your doctor if there is any change in your medical condition     (cold, fever, infections).    x___6. On the morning of surgery brush your teeth with toothpaste and water.  You may rinse your mouth with mouth wash if you wish.  Do not swallow any toothpaste or mouthwash.   Do not wear jewelry, make-up, hairpins, clips or nail polish.  Do not wear lotions, powders, or perfumes. You may wear deodorant.  Do not shave 48 hours prior to surgery. Men may shave face and neck.  Do not bring valuables to the hospital.    [facility] is not responsible for any belongings or valuables.     Contacts, dentures or bridgework may not be worn into surgery.  Leave your suitcase in the car. After surgery it may be brought to your room.  For patients admitted to the hospital, discharge time is determined by your  treatment team.  _  Patients discharged the day of surgery will not be allowed to drive home.  You will need someone to drive you home and stay with you the night of your procedure.    _x___ TAKE THE FOLLOWING MEDICATION THE MORNING OF SURGERY. These include:  1. ZYRTEC  2. PRILOSEC  3. TAKE AN EXTRA PRILOSEC THE NIGHT BEFORE YOUR SURGERY  4.  5.  6.  ____Fleets enema or Magnesium Citrate as directed.   ____ Use CHG Soap or sage wipes as directed on instruction sheet   ____ Use inhalers on the day of surgery and bring to hospital day of surgery  ____ Stop Metformin and Janumet 2 days prior to surgery.    ____ Take 1/2 of usual insulin dose the night before surgery and none on the morning surgery.   ____ Follow recommendations from Cardiologist, Pulmonologist or PCP regarding stopping Aspirin, Coumadin, Plavix ,Eliquis, Effient, or Pradaxa, and Pletal.  X____Stop Anti-inflammatories such as Advil, Aleve, Ibuprofen, Motrin, NAPROXEN, Naprosyn, Goodies powders or aspirin products NOW-OK to take Tylenol    ____ Stop supplements until after surgery.  ____ Bring C-Pap to the hospital.

## 2017-08-31 ENCOUNTER — Encounter: Payer: Self-pay | Admitting: *Deleted
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
## 2017-09-01 ENCOUNTER — Encounter: Payer: Self-pay | Admitting: *Deleted

## 2017-09-01 ENCOUNTER — Ambulatory Visit: Payer: Medicaid Other | Admitting: Anesthesiology

## 2017-09-01 ENCOUNTER — Other Ambulatory Visit: Payer: Self-pay

## 2017-09-01 ENCOUNTER — Encounter
Admission: RE | Disposition: A | Discharge status: Home / Self Care | Payer: Self-pay | Source: Ambulatory Visit | Attending: Obstetrics and Gynecology

## 2017-09-01 ENCOUNTER — Ambulatory Visit
Admission: RE | Admit: 2017-09-01 | Discharge: 2017-09-01 | Disposition: A | Discharge status: Home / Self Care | Payer: Medicaid Other | Source: Ambulatory Visit | Attending: Obstetrics and Gynecology | Admitting: Obstetrics and Gynecology

## 2017-09-01 DIAGNOSIS — K219 Gastro-esophageal reflux disease without esophagitis: Secondary | ICD-10-CM | POA: Diagnosis not present

## 2017-09-01 DIAGNOSIS — N736 Female pelvic peritoneal adhesions (postinfective): Secondary | ICD-10-CM | POA: Insufficient documentation

## 2017-09-01 DIAGNOSIS — Z6841 Body Mass Index (BMI) 40.0 and over, adult: Secondary | ICD-10-CM | POA: Diagnosis not present

## 2017-09-01 DIAGNOSIS — F17200 Nicotine dependence, unspecified, uncomplicated: Secondary | ICD-10-CM | POA: Diagnosis not present

## 2017-09-01 DIAGNOSIS — N939 Abnormal uterine and vaginal bleeding, unspecified: Secondary | ICD-10-CM | POA: Insufficient documentation

## 2017-09-01 DIAGNOSIS — Z9889 Other specified postprocedural states: Secondary | ICD-10-CM

## 2017-09-01 DIAGNOSIS — Z79899 Other long term (current) drug therapy: Secondary | ICD-10-CM | POA: Insufficient documentation

## 2017-09-01 DIAGNOSIS — N946 Dysmenorrhea, unspecified: Secondary | ICD-10-CM | POA: Diagnosis not present

## 2017-09-01 DIAGNOSIS — R102 Pelvic and perineal pain: Secondary | ICD-10-CM | POA: Insufficient documentation

## 2017-09-01 HISTORY — PX: HYSTEROSCOPY WITH D & C: SHX1775

## 2017-09-01 HISTORY — PX: LAPAROSCOPY: SHX197

## 2017-09-01 LAB — POCT PREGNANCY, URINE: PREG TEST UR: NEGATIVE

## 2017-09-01 SURGERY — LAPAROSCOPY OPERATIVE
Anesthesia: General | Wound class: Clean Contaminated

## 2017-09-01 MED ORDER — LIDOCAINE HCL (PF) 2 % IJ SOLN
INTRAMUSCULAR | Status: AC
  Filled 2017-09-01: qty 10

## 2017-09-01 MED ORDER — NEOSTIGMINE METHYLSULFATE 10 MG/10ML IV SOLN
INTRAVENOUS | Status: DC | PRN
  Administered 2017-09-01: 2 mg via INTRAVENOUS

## 2017-09-01 MED ORDER — BUPIVACAINE HCL 0.5 % IJ SOLN
INTRAMUSCULAR | Status: DC | PRN
  Administered 2017-09-01: 10 mL

## 2017-09-01 MED ORDER — ONDANSETRON HCL 4 MG/2ML IJ SOLN
INTRAMUSCULAR | Status: DC | PRN
  Administered 2017-09-01: 4 mg via INTRAVENOUS

## 2017-09-01 MED ORDER — HYDROCODONE-ACETAMINOPHEN 5-325 MG PO TABS
1.0000 | ORAL_TABLET | Freq: Once | ORAL | Status: AC
  Administered 2017-09-01: 1 via ORAL

## 2017-09-01 MED ORDER — FENTANYL CITRATE (PF) 100 MCG/2ML IJ SOLN
25.0000 ug | INTRAMUSCULAR | Status: DC | PRN
  Administered 2017-09-01 (×4): 25 ug via INTRAVENOUS

## 2017-09-01 MED ORDER — DEXAMETHASONE SODIUM PHOSPHATE 10 MG/ML IJ SOLN
INTRAMUSCULAR | Status: AC
  Filled 2017-09-01: qty 1

## 2017-09-01 MED ORDER — ROCURONIUM BROMIDE 50 MG/5ML IV SOLN
INTRAVENOUS | Status: AC
  Filled 2017-09-01: qty 1

## 2017-09-01 MED ORDER — FENTANYL CITRATE (PF) 100 MCG/2ML IJ SOLN
INTRAMUSCULAR | Status: DC | PRN
  Administered 2017-09-01 (×2): 25 ug via INTRAVENOUS
  Administered 2017-09-01: 75 ug via INTRAVENOUS
  Administered 2017-09-01: 25 ug via INTRAVENOUS
  Administered 2017-09-01: 50 ug via INTRAVENOUS

## 2017-09-01 MED ORDER — ONDANSETRON HCL 4 MG/2ML IJ SOLN
INTRAMUSCULAR | Status: AC
  Filled 2017-09-01: qty 2

## 2017-09-01 MED ORDER — HYDROCODONE-ACETAMINOPHEN 5-325 MG PO TABS
ORAL_TABLET | ORAL | Status: AC
  Filled 2017-09-01: qty 1

## 2017-09-01 MED ORDER — PROPOFOL 10 MG/ML IV BOLUS
INTRAVENOUS | Status: AC
  Filled 2017-09-01: qty 20

## 2017-09-01 MED ORDER — HYDROCODONE-ACETAMINOPHEN 5-325 MG PO TABS: 1.0000 | ORAL_TABLET | Freq: Four times a day (QID) | ORAL | 0 refills | Status: DC | PRN

## 2017-09-01 MED ORDER — GLYCOPYRROLATE 0.2 MG/ML IJ SOLN
INTRAMUSCULAR | Status: DC | PRN
  Administered 2017-09-01: 0.4 mg via INTRAVENOUS

## 2017-09-01 MED ORDER — EPHEDRINE SULFATE 50 MG/ML IJ SOLN
INTRAMUSCULAR | Status: AC
  Filled 2017-09-01: qty 1

## 2017-09-01 MED ORDER — FENTANYL CITRATE (PF) 100 MCG/2ML IJ SOLN
INTRAMUSCULAR | Status: AC
  Filled 2017-09-01: qty 4

## 2017-09-01 MED ORDER — NAPROXEN 500 MG PO TABS: 500.0000 mg | ORAL_TABLET | Freq: Two times a day (BID) | ORAL | 2 refills | Status: DC

## 2017-09-01 MED ORDER — LACTATED RINGERS IV SOLN
INTRAVENOUS | Status: DC
  Administered 2017-09-01 (×2): via INTRAVENOUS

## 2017-09-01 MED ORDER — ROCURONIUM BROMIDE 100 MG/10ML IV SOLN
INTRAVENOUS | Status: DC | PRN
  Administered 2017-09-01: 50 mg via INTRAVENOUS

## 2017-09-01 MED ORDER — MIDAZOLAM HCL 2 MG/2ML IJ SOLN
INTRAMUSCULAR | Status: DC | PRN
  Administered 2017-09-01: 2 mg via INTRAVENOUS

## 2017-09-01 MED ORDER — BUPIVACAINE HCL (PF) 0.5 % IJ SOLN
INTRAMUSCULAR | Status: AC
  Filled 2017-09-01: qty 30

## 2017-09-01 MED ORDER — MIDAZOLAM HCL 2 MG/2ML IJ SOLN
INTRAMUSCULAR | Status: AC
  Filled 2017-09-01: qty 2

## 2017-09-01 MED ORDER — LIDOCAINE HCL (CARDIAC) PF 100 MG/5ML IV SOSY
PREFILLED_SYRINGE | INTRAVENOUS | Status: DC | PRN
  Administered 2017-09-01: 100 mg via INTRAVENOUS

## 2017-09-01 MED ORDER — PROPOFOL 10 MG/ML IV BOLUS
INTRAVENOUS | Status: DC | PRN
  Administered 2017-09-01: 50 mg via INTRAVENOUS
  Administered 2017-09-01: 200 mg via INTRAVENOUS

## 2017-09-01 MED ORDER — ONDANSETRON HCL 4 MG/2ML IJ SOLN: 4.0000 mg | Freq: Once | INTRAMUSCULAR | Status: DC | PRN

## 2017-09-01 MED ORDER — DEXAMETHASONE SODIUM PHOSPHATE 10 MG/ML IJ SOLN
INTRAMUSCULAR | Status: DC | PRN
  Administered 2017-09-01: 10 mg via INTRAVENOUS

## 2017-09-01 MED ORDER — FENTANYL CITRATE (PF) 100 MCG/2ML IJ SOLN
INTRAMUSCULAR | Status: AC
  Filled 2017-09-01: qty 2

## 2017-09-01 SURGICAL SUPPLY — 35 items
BLADE SURG SZ11 CARB STEEL (BLADE) ×3 IMPLANT
CANISTER SUCT 1200ML W/VALVE (MISCELLANEOUS) ×3 IMPLANT
CATH ROBINSON RED A/P 16FR (CATHETERS) ×3 IMPLANT
CHLORAPREP W/TINT 26ML (MISCELLANEOUS) ×3 IMPLANT
DERMABOND ADVANCED (GAUZE/BANDAGES/DRESSINGS) ×2
DERMABOND ADVANCED .7 DNX12 (GAUZE/BANDAGES/DRESSINGS) ×1 IMPLANT
DRSG TEGADERM 2-3/8X2-3/4 SM (GAUZE/BANDAGES/DRESSINGS) ×3 IMPLANT
GLOVE BIO SURGEON STRL SZ7 (GLOVE) ×12 IMPLANT
GLOVE INDICATOR 7.5 STRL GRN (GLOVE) ×12 IMPLANT
GOWN STRL REUS W/ TWL LRG LVL3 (GOWN DISPOSABLE) ×2 IMPLANT
GOWN STRL REUS W/TWL LRG LVL3 (GOWN DISPOSABLE) ×4
IRRIGATION STRYKERFLOW (MISCELLANEOUS) IMPLANT
IRRIGATOR STRYKERFLOW (MISCELLANEOUS)
IV LACTATED RINGERS 1000ML (IV SOLUTION) ×3 IMPLANT
KIT PINK PAD W/HEAD ARE REST (MISCELLANEOUS) ×3
KIT PINK PAD W/HEAD ARM REST (MISCELLANEOUS) ×1 IMPLANT
KIT TURNOVER CYSTO (KITS) ×3 IMPLANT
LABEL OR SOLS (LABEL) ×3 IMPLANT
NS IRRIG 500ML POUR BTL (IV SOLUTION) ×3 IMPLANT
PACK DNC HYST (MISCELLANEOUS) ×3 IMPLANT
PACK GYN LAPAROSCOPIC (MISCELLANEOUS) ×3 IMPLANT
PAD OB MATERNITY 4.3X12.25 (PERSONAL CARE ITEMS) ×3 IMPLANT
PAD PREP 24X41 OB/GYN DISP (PERSONAL CARE ITEMS) ×3 IMPLANT
SCISSORS METZENBAUM CVD 33 (INSTRUMENTS) IMPLANT
SEAL ROD LENS SCOPE MYOSURE (ABLATOR) ×3 IMPLANT
SHEARS HARMONIC ACE PLUS 36CM (ENDOMECHANICALS) ×3 IMPLANT
SLEEVE ENDOPATH XCEL 5M (ENDOMECHANICALS) ×3 IMPLANT
SPONGE GAUZE 2X2 8PLY STER LF (GAUZE/BANDAGES/DRESSINGS) ×1
SPONGE GAUZE 2X2 8PLY STRL LF (GAUZE/BANDAGES/DRESSINGS) ×2 IMPLANT
SUT MNCRL AB 4-0 PS2 18 (SUTURE) ×3 IMPLANT
SUT VIC AB 0 CT2 27 (SUTURE) ×3 IMPLANT
SYR 10ML LL (SYRINGE) ×3 IMPLANT
TROCAR ENDO BLADELESS 11MM (ENDOMECHANICALS) IMPLANT
TROCAR XCEL NON-BLD 5MMX100MML (ENDOMECHANICALS) ×3 IMPLANT
TUBING INSUFFLATION (TUBING) ×3 IMPLANT

## 2017-09-01 NOTE — Anesthesia Procedure Notes (Signed)
Performed by: Frazier Balfour H, CRNA       

## 2017-09-01 NOTE — Transfer of Care (Signed)
Immediate Anesthesia Transfer of Care Note  Patient: Elizabeth Spencer  Procedure(s) Performed: LAPAROSCOPY OPERATIVE with biopsy (N/A ) DILATATION AND CURETTAGE /HYSTEROSCOPY (N/A )  Patient Location: PACU  Anesthesia Type:General  Level of Consciousness: awake and alert   Airway & Oxygen Therapy: Patient Spontanous Breathing and Patient connected to face mask oxygen  Post-op Assessment: Report given to RN and Post -op Vital signs reviewed and stable  Post vital signs: stable  Last Vitals:  Vitals Value Taken Time  BP 138/78 09/01/2017  9:10 AM  Temp 36.7 C 09/01/2017  9:08 AM  Pulse 72 09/01/2017  9:14 AM  Resp 22 09/01/2017  9:14 AM  SpO2 100 % 09/01/2017  9:14 AM  Vitals shown include unvalidated device data.  Last Pain:  Vitals:   09/01/17 0908  TempSrc:   PainSc: 10-Worst pain ever         Complications: No apparent anesthesia complications

## 2017-09-01 NOTE — Anesthesia Procedure Notes (Signed)
Procedure Name: Intubation Date/Time: 09/01/2017 8:05 AM Performed by: Babs Sciara, CRNA Pre-anesthesia Checklist: Patient identified, Emergency Drugs available, Suction available, Patient being monitored and Timeout performed Patient Re-evaluated:Patient Re-evaluated prior to induction Oxygen Delivery Method: Circle system utilized Preoxygenation: Pre-oxygenation with 100% oxygen Induction Type: IV induction Ventilation: Mask ventilation without difficulty Laryngoscope Size: Mac and 3 Grade View: Grade I Tube type: Oral Tube size: 7.0 mm Number of attempts: 1 Placement Confirmation: ETT inserted through vocal cords under direct vision,  positive ETCO2 and breath sounds checked- equal and bilateral Secured at: 21 cm Tube secured with: Tape Dental Injury: Teeth and Oropharynx as per pre-operative assessment

## 2017-09-01 NOTE — Anesthesia Post-op Follow-up Note (Signed)
Anesthesia QCDR form completed.        

## 2017-09-01 NOTE — Discharge Instructions (Signed)
AMBULATORY SURGERY  °DISCHARGE INSTRUCTIONS ° ° °1) The drugs that you were given will stay in your system until tomorrow so for the next 24 hours you should not: ° °A) Drive an automobile °B) Make any legal decisions °C) Drink any alcoholic beverage ° ° °2) You may resume regular meals tomorrow.  Today it is better to start with liquids and gradually work up to solid foods. ° °You may eat anything you prefer, but it is better to start with liquids, then soup and crackers, and gradually work up to solid foods. ° ° °3) Please notify your doctor immediately if you have any unusual bleeding, trouble breathing, redness and pain at the surgery site, drainage, fever, or pain not relieved by medication. ° ° ° °4) Additional Instructions: ° ° ° ° ° ° ° °Please contact your physician with any problems or Same Day Surgery at [phone], Monday through Friday 6 am to 4 pm, or [place] [facility] [place] Main number at [phone]. °

## 2017-09-01 NOTE — Op Note (Signed)
Preoperative Diagnosis: 1) 32 y.o.  Chronic pelvic pain 2) Dysmenorrhea 3) Abnormal uterine bleeding  Postoperative Diagnosis: 1) 32 y.o. Chronic pelvic pain 2) Dysmenorrhea 3) Abnormal uterine bleeding  Operation Performed: Diagnostic laparoscopy, hysteroscopy, dilation and curettage  Indication: 32 y.o. [id number]  with chronic pelvic pain, dysmenorrhea, and abnormal uterine bleeding   Surgeon: Vena Austria, MD  Anesthesia: General  Preoperative Antibiotics: none  Estimated Blood Loss: 10 mL  IV Fluids:  Urine Output::  Drains or Tubes: none  Implants: none  Specimens Removed: endometrial curettings, left ovary and left pelvis side wall biopsy  Complications: none  Intraoperative Findings: Normal tubes (status post prior tubal ligation), ovaries, and uterus.  Small peritoneal inclusion cyst left pelvic side-wall and ovary.  Normal liver edge, appendix.  Normal ureters.  No evidence of hernias.  No adhesions or scar tissue.  Patient Condition: stable  Procedure in Detail:  Patient was taken to the operating room where she was administered general anesthesia.  She was positioned in the dorsal lithotomy position utilizing Allen stirups, prepped and draped in the usual sterile fashion.  Prior to proceeding with procedure a time out was performed.  Attention was turned to the patient's pelvis.  A red rubber catheter was used to empty the patient's bladder.  An operative speculum was placed to allow visualization of the cervix.  The anterior lip of the cervix was grasped with a single tooth tenaculum, and a Hulka tenaculum was placed to allow manipulation of the uterus.  The operative speculum and single tooth tenaculum were then removed.  Attention was turned to the patient's abdomen.  The umbilicus was infiltrated with 1% Sensorcaine, before making a stab incision using an 11 blade scalpel.  A 5mm Excel trocar was then used to gain direct entry into the peritoneal  cavity utilizing the camera to visualize progress of the trocar during placement.  Once peritoneal entry had been achieved, insufflation was started and pneumoperitoneum established at a pressure of . An additional 5mm excel trocars were placed under direct visualiztion in the left lower quadrant. General inspection of the abdomen revealed the above noted findings.   Pneumoperitoneum was evacuated.  The trocars were removed.  All trocar sites were then dressed with surgical skin glue.  The Hulka tenaculum was removed.   The anterior lip of the cervix was grasped with a single tooth tenaculum and the cervix was sequentially dilated using pratt dilators.  The hysteroscope was then advanced into the uterine cavity noting the above findings.  Curettage was performed using a Myosure system and the resulting specimen collected and sent to pathology.    The single tooth tenaculum was removed from the cervix.  The tenaculum sites and cervix were noted to be  Hemostatic before removing the operative speculum.  Sponge needle and instrument counts were corrects times two.  The patient tolerated the procedure well and was taken to the recovery room in stable condition.

## 2017-09-01 NOTE — H&P (Signed)
Date of Initial H&P: 08/17/2017  History reviewed, patient examined, no change in status, stable for surgery hysteroscopy, D&C, diagnostic laparoscopy for dysmenorrhea, chronic pelvic pain, AUB

## 2017-09-01 NOTE — Progress Notes (Signed)
Patient was a bit impatient and partner apprehensive. Chaplain assured patient, helped the partner to be calmer, and provided emotional support to both.

## 2017-09-01 NOTE — Anesthesia Preprocedure Evaluation (Signed)
Anesthesia Evaluation  Patient identified by MRN, date of birth, ID band Patient awake    Reviewed: Allergy & Precautions, H&P , NPO status , Patient's Chart, lab work & pertinent test results, reviewed documented beta blocker date and time   History of Anesthesia Complications (+) history of anesthetic complications  Airway Mallampati: III  TM Distance: >3 FB Neck ROM: full    Dental  (+) Teeth Intact, Poor Dentition, Chipped, Dental Advidsory Given   Pulmonary neg pulmonary ROS, neg shortness of breath, asthma , Current Smoker,    Pulmonary exam normal        Cardiovascular Exercise Tolerance: Good + Peripheral Vascular Disease  negative cardio ROS Normal cardiovascular exam Rhythm:regular Rate:Normal     Neuro/Psych  Headaches, PSYCHIATRIC DISORDERS Anxiety negative neurological ROS  negative psych ROS   GI/Hepatic negative GI ROS, Neg liver ROS, GERD  Medicated,  Endo/Other  negative endocrine ROSMorbid obesity  Renal/GU negative Renal ROS  negative genitourinary   Musculoskeletal   Abdominal   Peds  Hematology negative hematology ROS (+)   Anesthesia Other Findings Past Medical History: No date: Arthritis No date: Asthma     Comment:  WELL CONTROLLED 2007: Brain aneurysm     Comment:  NEUROLOGY NOTE DOES NOT MENTION ANEURYSM BUT PT STATES               SHE DID NOT HAVE TO HAVE SURGERY No date: Cancer (HCC) No date: Complication of anesthesia     Comment:  FOR 1 C-SECTION PT WAS ITCHING AND VERY RED ON HER FACE No date: Gallstones No date: GERD (gastroesophageal reflux disease) No date: Headache     Comment:  CHRONIC HEADACHES No date: History of chronic cough     Comment:  DRY No date: PTSD (post-traumatic stress disorder) No date: PTSD (post-traumatic stress disorder) No date: Thyroid condition     Comment:  PT WAS JUST TOLD ON 08-25-17 THAT SHE HAS A THYROID               PROBLEM AND IS GOING TO  F/U WITH ENDOCRINOLOGIST IN 2               WEEKS Past Surgical History: No date: CESAREAN SECTION     Comment:  x3 BMI    Body Mass Index:  41.54 kg/m     Reproductive/Obstetrics negative OB ROS                             Anesthesia Physical Anesthesia Plan  ASA: III  Anesthesia Plan: General ETT   Post-op Pain Management:    Induction:   PONV Risk Score and Plan:   Airway Management Planned:   Additional Equipment:   Intra-op Plan:   Post-operative Plan:   Informed Consent: I have reviewed the patients History and Physical, chart, labs and discussed the procedure including the risks, benefits and alternatives for the proposed anesthesia with the patient or authorized representative who has indicated his/her understanding and acceptance.   Dental Advisory Given  Plan Discussed with: CRNA  Anesthesia Plan Comments:         Anesthesia Quick Evaluation

## 2017-09-02 LAB — SURGICAL PATHOLOGY

## 2017-09-05 NOTE — Anesthesia Postprocedure Evaluation (Signed)
Anesthesia Post Note  Patient: Elizabeth Spencer  Procedure(s) Performed: LAPAROSCOPY OPERATIVE with biopsy (N/A ) DILATATION AND CURETTAGE /HYSTEROSCOPY (N/A )  Anesthesia Type: General     Last Vitals:  Vitals:   09/01/17 0953 09/01/17 1005  BP: (!) 105/49 115/67  Pulse: 77 60  Resp: 19 16  Temp: 37 C 36.5 C  SpO2: 97% 99%    Last Pain:  Vitals:   09/02/17 0848  TempSrc:   PainSc: 8                  Yevette EdwardsJames G Adams

## 2017-09-06 ENCOUNTER — Telehealth: Payer: Self-pay

## 2017-09-06 NOTE — Telephone Encounter (Signed)
Pt called with questions regarding medication s/p D&C on 09/01/17. Pt states she is in pain 8 of 10 in back and abdominal pain with no relief from pain medication. Pt also states she is soaking a pad every hour to 90 minutes and soaking clothing. Pt wants to know if she should continue taking aygestin 5 mg to help with bleeding or wait until her post op appt on 6/7 to discuss with you. Please advise. Pt aware AMS on call/OR today. Cb# [phone] bleeding precautions given for ER if needed. Thank you.

## 2017-09-06 NOTE — Telephone Encounter (Signed)
Pt aware to continue aygestin. Per pt ER called to follow up and she is experiencing pain in right shoulder from gas. She also has not had a chance to rest due to recent move. Pt aware to go to ER if pain or bleeding worsens and follow up at appt. Encouraged rest and hydration with tylenol/ibuprofen for pain.

## 2017-09-06 NOTE — Telephone Encounter (Signed)
Keep taking aygestin her pathology was all normal.  If she is having significant pain though she may need to be evaluated in the OR as she has a pretty minor procedure

## 2017-09-09 ENCOUNTER — Ambulatory Visit (INDEPENDENT_AMBULATORY_CARE_PROVIDER_SITE_OTHER): Payer: Medicaid Other | Admitting: Obstetrics and Gynecology

## 2017-09-09 ENCOUNTER — Encounter: Payer: Self-pay | Admitting: Obstetrics and Gynecology

## 2017-09-09 VITALS — BP 114/68 | HR 70 | Ht 63.0 in | Wt 232.0 lb

## 2017-09-09 DIAGNOSIS — Z4889 Encounter for other specified surgical aftercare: Secondary | ICD-10-CM

## 2017-09-09 NOTE — Progress Notes (Signed)
      Postoperative Follow-up Patient presents post op from diagnostic laparoscopy, hysteroscopy D&C 2weeks ago for abnormal uterine bleeding and dysmenorrhea/pelvic pain.  Subjective: Patient reports no improvement in her preop symptoms. Eating a regular diet without difficulty.   Activity: normal activities of daily living.  Objective: Blood pressure 114/68, pulse 70, height 5\' 3"  (1.6 m), weight 232 lb (105.2 kg), last menstrual period 08/22/2017.  Gen: NAD  HEENT: normocephalic, anicteric Abdomen: soft, non-tender, trocar sites D/C/I  Admission on 09/01/2017, Discharged on 09/01/2017  Component Date Value Ref Range Status  . Preg Test, Ur 09/01/2017 NEGATIVE  NEGATIVE Final   Comment:        THE SENSITIVITY OF THIS METHODOLOGY IS >24 mIU/mL   . SURGICAL PATHOLOGY 09/01/2017    Final                   Value:Surgical Pathology CASE: [id number] PATIENT: Elizabeth Spencer Surgical Pathology Report     SPECIMEN SUBMITTED: A. Pelvic side wall, left, ovary serosa; bx B. Endometrial curettings  CLINICAL HISTORY: None provided  PRE-OPERATIVE DIAGNOSIS: Chronic pelvic pain, AUB  POST-OPERATIVE DIAGNOSIS: Dysmenorrhea, chronic pelvic pain, AUB     DIAGNOSIS: A.  LEFT PELVIC SIDEWALL AND OVARIAN SEROSA; BIOPSY: - INFLAMED, MESOTHELIAL LINED FIBROUS TISSUE, CONSISTENT WITH FIBROUS ADHESION.  B.  ENDOMETRIAL CURETTINGS; DILATATION AND CURETTAGE: - SECRETORY ENDOMETRIUM. - NEGATIVE FOR HYPERPLASIA, ATYPIA AND CARCINOMA.   GROSS DESCRIPTION: A. Labeled: Left pelvic sidewall and ovary serosa biopsy Received: Formalin Tissue fragment(s): 2 Size: 0.3 x 0.2 x <0.1 cm and 0.7 x 0.4 x 0.2 cm Description: Tan-pink soft tissue fragments.  Filtered. Entirely submitted in one cassette.  B. Labeled: Endometrial curettings Received: Formalin Tissue fragment(s): Multiple S                         ize: Aggregate, 2.2 x 1.3 x 0.3 cm Description: Tan-pink soft tissue  fragments Entirely submitted in one cassette.     Final Diagnosis performed by Glenice Bowana Baker, MD.   Electronically signed 09/02/2017 10:03:06AM The electronic signature indicates that the named Attending Pathologist has evaluated the specimen  Technical component performed at [facility], [address], [place], [place] [address] Lab: [phone] Dir: Jolene SchimkeSanjai Nagendra, MD, MMM  Professional component performed at [facility], [facility], [address], [place], [place] [address] Lab: [phone] Dir: Georgiann Cockerara C. Oneita Krasubinas, MD     Assessment: 32 y.o. s/p diagnostic laparoscopy, hysteroscopy, D&C stable  Plan: Patient has done well after surgery with no apparent complications.  I have discussed the post-operative course to date, and the expected progress moving forward.  The patient understands what complications to be concerned about.  I will see the patient in routine follow up, or sooner if needed.    Activity plan: No restriction.  - continue norethindrone - only findings was globular uterus possible adenomyosis patient wishes to proceed with hysterectomy  Elizabeth AustriaAndreas Tamesha Ellerbrock, MD, Merlinda FrederickFACOG [facility], [facility]

## 2017-09-14 ENCOUNTER — Telehealth: Payer: Self-pay | Admitting: Obstetrics and Gynecology

## 2017-09-14 NOTE — Telephone Encounter (Signed)
-----   Message from Vena AustriaAndreas Staebler, MD sent at 09/13/2017 10:58 AM EDT ----- Regarding: Surgery Surgery Date:   LOS: observation  Surgery Booking Request Patient Full Name: Elizabeth DallasSusan Spencer MRN: [id number]  DOB: 10/05/1985  Surgeon: Vena AustriaAndreas Staebler, MD  Requested Surgery Date and Time: 1-4 weeks Primary Diagnosis and Code: Pelvic pain Secondary Diagnosis and Code: Dysmenorrhea Surgical Procedure: TLH/BS and cystoscopy L&D Notification:N/A Admission Status: observation Length of Surgery: 2.5hrs Special Case Needs: none H&P:  (date) Phone Interview or Office Pre-Admit: pre-admit Interpreter: No Language: English Medical Clearance: No Special Scheduling Instructions:

## 2017-09-14 NOTE — Telephone Encounter (Signed)
Lmtrc

## 2017-09-14 NOTE — Telephone Encounter (Signed)
Patient is aware of H[facility] at [facility] on 09/21/17 @ 11:10am w/ Dr. Bonney AidStaebler, Pre-admit Testing to be scheduled, and OR on 09/29/17. Patient is aware she may receive calls from the [facility] and [facility]. Patient confirmed she has Medicaid, and no secondary insurance. Ext given.

## 2017-09-21 ENCOUNTER — Inpatient Hospital Stay: Admission: RE | Admit: 2017-09-21 | Payer: Medicaid Other | Source: Ambulatory Visit

## 2017-09-21 ENCOUNTER — Encounter: Payer: Medicaid Other | Admitting: Obstetrics and Gynecology

## 2017-09-26 ENCOUNTER — Inpatient Hospital Stay: Admission: RE | Admit: 2017-09-26 | Payer: Medicaid Other | Source: Ambulatory Visit

## 2017-09-27 ENCOUNTER — Encounter
Admission: RE | Admit: 2017-09-27 | Discharge: 2017-09-27 | Disposition: A | Discharge status: Home / Self Care | Payer: Medicaid Other | Source: Ambulatory Visit | Attending: Obstetrics and Gynecology | Admitting: Obstetrics and Gynecology

## 2017-09-27 ENCOUNTER — Encounter: Payer: Self-pay | Admitting: *Deleted

## 2017-09-27 ENCOUNTER — Other Ambulatory Visit: Payer: Self-pay

## 2017-09-27 DIAGNOSIS — Z01810 Encounter for preprocedural cardiovascular examination: Secondary | ICD-10-CM | POA: Diagnosis present

## 2017-09-27 DIAGNOSIS — I498 Other specified cardiac arrhythmias: Secondary | ICD-10-CM | POA: Insufficient documentation

## 2017-09-27 DIAGNOSIS — Z01812 Encounter for preprocedural laboratory examination: Secondary | ICD-10-CM | POA: Insufficient documentation

## 2017-09-27 DIAGNOSIS — Z8709 Personal history of other diseases of the respiratory system: Secondary | ICD-10-CM | POA: Diagnosis not present

## 2017-09-27 HISTORY — DX: Pneumonia, unspecified organism: J18.9

## 2017-09-27 HISTORY — DX: Family history of other specified conditions: Z84.89

## 2017-09-27 LAB — CBC
HEMATOCRIT: 41.4 % (ref 35.0–47.0)
Hemoglobin: 14 g/dL (ref 12.0–16.0)
MCH: 30 pg (ref 26.0–34.0)
MCHC: 33.8 g/dL (ref 32.0–36.0)
MCV: 88.7 fL (ref 80.0–100.0)
Platelets: 212 10*3/uL (ref 150–440)
RBC: 4.67 MIL/uL (ref 3.80–5.20)
RDW: 12.9 % (ref 11.5–14.5)
WBC: 9.5 10*3/uL (ref 3.6–11.0)

## 2017-09-27 LAB — BASIC METABOLIC PANEL
Anion gap: 10 (ref 5–15)
BUN: 8 mg/dL (ref 6–20)
CO2: 19 mmol/L — ABNORMAL LOW (ref 22–32)
Calcium: 9.3 mg/dL (ref 8.9–10.3)
Chloride: 110 mmol/L (ref 98–111)
Creatinine, Ser: 0.56 mg/dL (ref 0.44–1.00)
GFR calc Af Amer: >60 mL/min (ref >60)
GFR calc non Af Amer: >60 mL/min (ref >60)
GLUCOSE: 100 mg/dL — AB (ref 70–99)
POTASSIUM: 3.3 mmol/L — AB (ref 3.5–5.1)
Sodium: 139 mmol/L (ref 135–145)

## 2017-09-27 LAB — TYPE AND SCREEN
ABO/RH(D): A NEG
Antibody Screen: NEGATIVE

## 2017-09-27 NOTE — Patient Instructions (Signed)
Your procedure is scheduled on:  Thursday, September 29, 2017 Report to Day Surgery [facility]nd [facility] To find out your arrival time, please call [phone] between 1PM - 3PM on: Wednesday, September 28, 2017  REMEMBER: Instructions that are not followed completely may result in serious medical risk, up to and including death; or upon the discretion of your surgeon and anesthesiologist your surgery may need to be rescheduled.  Do not eat food after midnight the night before your procedure.  No gum chewing, lozengers or hard candies.  You may however, drink CLEAR liquids up to 2 hours before you are scheduled to arrive for your surgery. Do not drink anything within 2 hours of the start of your surgery.  Clear liquids include: - water  - apple juice without pulp - clear gatorade - black coffee or tea (Do NOT add anything to the coffee or tea) Do NOT drink anything that is not on this list.  No Alcohol for 24 hours before or after surgery.  No Smoking including e-cigarettes for 24 hours prior to surgery.  No chewable tobacco products for at least 6 hours prior to surgery.  No nicotine patches on the day of surgery.  On the morning of surgery brush your teeth with toothpaste and water, you may rinse your mouth with mouthwash if you wish. Do not swallow any toothpaste or mouthwash.  Notify your doctor if there is any change in your medical condition (cold, fever, infection).  Do not wear jewelry, make-up, hairpins, clips or nail polish.  Do not wear lotions, powders, or perfumes. You may wear deodorant.  Do not shave 48 hours prior to surgery.   Contacts and dentures may not be worn into surgery.  Do not bring valuables to the hospital, including drivers license, insurance or credit cards.  [facility] is not responsible for any belongings or valuables.   TAKE THESE MEDICATIONS THE MORNING OF SURGERY:  1.  Albuterol inhaler  Use CHG Soap as directed on instruction  sheet.  Use inhalers on the day of surgery and bring to the hospital.  NOW!  Stop Anti-inflammatories (NSAIDS) such as Advil, Aleve, Ibuprofen, Motrin, Naproxen, Naprosyn and Aspirin based products such as Excedrin, Goodys Powder, BC Powder. (May take Tylenol or Acetaminophen if needed.)  NOW!  Stop ANY OVER THE COUNTER supplements until after surgery. (May continue multivitamin.)  Wear comfortable clothing (specific to your surgery type) to the hospital.  Plan for stool softeners for home use.  If you are being admitted to the hospital overnight, leave your suitcase in the car. After surgery it may be brought to your room.  If you are taking public transportation, you will need to have a responsible adult with you. Please confirm with your physician that it is acceptable to use public transportation.   Please call [phone] if you have any questions about these instructions.

## 2017-09-28 ENCOUNTER — Ambulatory Visit (INDEPENDENT_AMBULATORY_CARE_PROVIDER_SITE_OTHER): Payer: Medicaid Other | Admitting: Obstetrics and Gynecology

## 2017-09-28 ENCOUNTER — Encounter: Payer: Self-pay | Admitting: Obstetrics and Gynecology

## 2017-09-28 VITALS — BP 112/68 | HR 110 | Ht 63.0 in | Wt 225.0 lb

## 2017-09-28 DIAGNOSIS — Z113 Encounter for screening for infections with a predominantly sexual mode of transmission: Secondary | ICD-10-CM | POA: Diagnosis not present

## 2017-09-28 DIAGNOSIS — Z4889 Encounter for other specified surgical aftercare: Secondary | ICD-10-CM

## 2017-09-28 NOTE — Progress Notes (Signed)
Obstetrics & Gynecology Surgery H&P    Chief Complaint: Scheduled Surgery   History of Present Illness: Patient is a 32 y.o. [id number] presenting for scheduled TLH, BS, cystoscopy, for the treatment or further evaluation of abnormal uterine bleeding and chronic pelvic pain.   Prior Treatments prior to proceeding with surgery include: attempts at hormonal management  Preoperative Pap: 08/20/17 Results: NIL HPV negative  Preoperative Endometrial biopsy: 09/02/17 Findings: secretory endometrium  Preoperative Ultrasound: 12/14/2016 normal Diagnostic laparoscopy 09/02/17 - some mild adhesive disease left adnexa at site of prior tubal  Patient is a 32 y.o. [id number] presents for STD testing.  The patient has not noted intermenstrual spotting,  has not experienced postcoital bleeding, and does not report increased vaginal discharge.    The patient is sexually active and reports recently cheating or her boyfriend . She currently uses Female sterilization: Present: yes for contraception. She never uses condoms or barrier methods to prevent the spread of sexually transmitted infections.  No symptoms.  No specific known exposures but would like complete STI testing.  Review of Systems:10 point review of systems  Past Medical History:  Past Medical History:  Diagnosis Date  . Arthritis   . Asthma    WELL CONTROLLED  . Brain aneurysm 2007   NEUROLOGY NOTE DOES NOT MENTION ANEURYSM BUT PT STATES SHE DID NOT HAVE TO HAVE SURGERY  . Complication of anesthesia    FOR 1 C-SECTION PT WAS ITCHING AND VERY RED ON HER FACE  . Family history of adverse reaction to anesthesia    brother, neice and nephew got red in face with hives  . Gallstones   . GERD (gastroesophageal reflux disease)   . Headache    CHRONIC HEADACHES  . History of chronic cough    DRY  . Pneumonia   . PTSD (post-traumatic stress disorder)   . PTSD (post-traumatic stress disorder)   . Thyroid condition    PT WAS JUST TOLD ON 08-25-17  THAT SHE HAS A THYROID PROBLEM AND IS GOING TO F/U WITH ENDOCRINOLOGIST IN 2 WEEKS    Past Surgical History:  Past Surgical History:  Procedure Laterality Date  . CESAREAN SECTION     x3  . HYSTEROSCOPY W/D&C N/A 09/01/2017   Procedure: DILATATION AND CURETTAGE /HYSTEROSCOPY;  Surgeon: Vena AustriaStaebler, Ignatz Deis, MD;  Location: [facility] ORS;  Service: Gynecology;  Laterality: N/A;  . LAPAROSCOPY N/A 09/01/2017   Procedure: LAPAROSCOPY OPERATIVE with biopsy;  Surgeon: Vena AustriaStaebler, Damean Poffenberger, MD;  Location: [facility] ORS;  Service: Gynecology;  Laterality: N/A;    Family History:  Family History  Problem Relation Age of Onset  . Hypertension Mother   . Alcohol abuse Mother   . Deep vein thrombosis Mother   . Alcohol abuse Father   . COPD Father   . Emphysema Father   . Heart disease Father     Social History:  Social History   Socioeconomic History  . Marital status: Single    Spouse name: Not on file  . Number of children: Not on file  . Years of education: Not on file  . Highest education level: Not on file  Occupational History  . Not on file  Social Needs  . Financial resource strain: Not on file  . Food insecurity:    Worry: Not on file    Inability: Not on file  . Transportation needs:    Medical: Not on file    Non-medical: Not on file  Tobacco Use  . Smoking status: Current Every Day  Smoker    Packs/day: 1.00    Years: 18.00    Pack years: 18.00    Types: Cigarettes  . Smokeless tobacco: Never Used  Substance and Sexual Activity  . Alcohol use: Yes    Alcohol/week: 0.0 oz    Comment: daily the last 2 weeks  . Drug use: Yes    Types: Marijuana    Comment: last on 09/25/17  . Sexual activity: Yes    Partners: Male    Birth control/protection: Pill  Lifestyle  . Physical activity:    Days per week: Not on file    Minutes per session: Not on file  . Stress: Not on file  Relationships  . Social connections:    Talks on phone: Not on file    Gets together: Not on file     Attends religious service: Not on file    Active member of club or organization: Not on file    Attends meetings of clubs or organizations: Not on file    Relationship status: Not on file  . Intimate partner violence:    Fear of current or ex partner: Not on file    Emotionally abused: Not on file    Physically abused: Not on file    Forced sexual activity: Not on file  Other Topics Concern  . Not on file  Social History Narrative  . Not on file    Allergies:  Allergies  Allergen Reactions  . Azithromycin Swelling and Other (See Comments)  . Meloxicam Other (See Comments)    Burns up from the inside   . Neurontin [Gabapentin] Other (See Comments)    Makes her pass out and not remember what happened prior to taking med.  . Oxycodone Nausea And Vomiting    dizziness  . Penicillins Anaphylaxis    Patient allergic to all "cillins" Has patient had a PCN reaction causing immediate rash, facial/tongue/throat swelling, SOB or lightheadedness with hypotension: Yes Has patient had a PCN reaction causing severe rash involving mucus membranes or skin necrosis: Yes Has patient had a PCN reaction that required hospitalization: No Has patient had a PCN reaction occurring within the last 10 years: Yes If all of the above answers are "NO", then may proceed with Cephalosporin use.   . Other Hives and Other (See Comments)    Berry flavored food and drinks  . Diphenhydramine Hcl Rash  . Doxycycline Rash    Medications: Prior to Admission medications   Medication Sig Start Date End Date Taking? Authorizing Provider  albuterol (PROVENTIL HFA;VENTOLIN HFA) 108 (90 Base) MCG/ACT inhaler Inhale 2 puffs into the lungs every 6 (six) hours as needed for wheezing or shortness of breath.  01/03/15   [provider]  cetirizine (ZYRTEC) 10 MG tablet Take 10 mg by mouth at bedtime.  03/11/17 03/11/18  [provider]  EPIPEN 2-PAK 0.3 MG/0.3ML SOAJ injection INJECT INTO THIGH MUSCLE  THROUGH CLOTHES AS NEEDED SEVERE ALLERGIC REACTION 01/29/15   [provider]  ketoconazole (NIZORAL) 2 % shampoo SHAMPOO DAILY,AFTER UNDER CONTROL ONCE WEEKLY.APPLY & LATHER-LEAVE ON FOR BEFORE WASHING OFF 06/27/17   [provider]  Multiple Vitamins-Minerals (CENTRUM ADULTS PO) Take 1 tablet by mouth at bedtime.     [provider]  naproxen (NAPROSYN) 500 MG tablet Take 1 tablet (500 mg total) by mouth 2 (two) times daily with a meal. As needed for pain Patient taking differently: Take 500 mg by mouth 2 (two) times daily as needed for moderate  pain.  09/01/17   Vena Austria, MD  naproxen sodium (ALEVE) 220 MG tablet Take 220 mg by mouth daily as needed.     [provider]  norethindrone (AYGESTIN) 5 MG tablet Take 1 tablet (5 mg total) by mouth daily. Patient not taking: Reported on 09/19/2017 05/02/17   Vena Austria, MD  omeprazole (PRILOSEC) 20 MG capsule Take 20 mg by mouth at bedtime.  03/11/17   [provider]  RETIN-A 0.025 % cream APPLY PEA SIZE AMOUNT TO FACE AT NIGHT 06/27/17   [provider]  triamcinolone cream (KENALOG) 0.1 % Apply topically once daily as needed for psoriasis Chesley Noon 11/01/16   [provider]    Physical Exam Vitals: Blood pressure 112/68, pulse (!) 110, height 5\' 3"  (1.6 m), weight 225 lb (102.1 kg). General: NAD HEENT: normocephalic, anicteric Pulmonary: No increased work of breathing Cardiovascular: RRR, distal pulses 2+ Abdomen: soft, non-tender, non-distended Genitourinary: normal external female genitalia Extremities: no edema, erythema, or tenderness Neurologic: Grossly intact Psychiatric: mood appropriate, affect full  Imaging No results found.  Assessment: 32 y.o. [id number] presenting for scheduled TLH, BS, cystoscopy, and STI screening  Plan: 1) Patient opts for definitive surgical management via hysterectomy. The risks of surgery were discussed in detail with the patient  including but not limited to: bleeding which may require transfusion or reoperation; infection which may require antibiotics; injury to bowel, bladder, ureters or other surrounding organs (With a literature reported rate of urinary tract injury of 1% quoted); need for additional procedures including laparotomy; thromboembolic phenomenon, incisional problems and other postoperative/anesthesia complications.  Patient was also advised that recovery procedure generally involves an overnight stay; and the  expected recovery time after a hysterectomy being in the range of 6-8 weeks.  Likelihood of success in alleviating the patient's symptoms was discussed.  While definitive in regards to issues with menstural bleeding, pelvic pain if present preoperatively may continue and in fact worsen postoperatively.  She is aware that the procedure will render her unable to pursue childbearing in the future.   She was told that she will be contacted by our surgical scheduler regarding the time and date of her surgery; routine preoperative instructions of having nothing to eat or drink after midnight on the day prior to surgery and also coming to the hospital 1.5 hours prior to her time of surgery were also emphasized.  She was told she may be called for a preoperative appointment about a week prior to surgery and will be given further preoperative instructions at that visit.  Routine postoperative instructions will be reviewed with the patient and her family in detail after surgery. Printed patient education handouts about the procedure was given to the patient to review at home.   2) Routine postoperative instructions were reviewed with the patient and her family in detail today including the expected length of recovery and likely postoperative course.  The patient concurred with the proposed plan, giving informed written consent for the surgery today.  Patient instructed on the importance of being NPO after midnight prior to  her procedure.  If warranted preoperative prophylactic antibiotics and SCDs ordered on call to the OR to meet SCIP guidelines and adhere to recommendation laid forth in ACOG Practice Bulletin Number 104 May 2009  "Antibiotic Prophylaxis for Gynecologic Procedures".    3) STI testing - stressed importance of safe sex.  We also discussed the fact that with a recent exposure labs may not be positive and she will need repeat testing.  4) Return in about 2 weeks (around 10/12/2017) for postop.    Vena Austria, MD, Evern Core [facility], [facility] 09/28/2017, 11:26 AM

## 2017-09-29 ENCOUNTER — Observation Stay
Admission: RE | Admit: 2017-09-29 | Discharge: 2017-09-30 | Disposition: A | Discharge status: Home / Self Care | Payer: Medicaid Other | Source: Ambulatory Visit | Attending: Obstetrics and Gynecology | Admitting: Obstetrics and Gynecology

## 2017-09-29 ENCOUNTER — Other Ambulatory Visit: Payer: Self-pay

## 2017-09-29 ENCOUNTER — Ambulatory Visit: Payer: Medicaid Other | Admitting: Anesthesiology

## 2017-09-29 ENCOUNTER — Encounter: Payer: Self-pay | Admitting: *Deleted

## 2017-09-29 ENCOUNTER — Encounter
Admission: RE | Disposition: A | Discharge status: Home / Self Care | Payer: Self-pay | Source: Ambulatory Visit | Attending: Obstetrics and Gynecology

## 2017-09-29 DIAGNOSIS — N946 Dysmenorrhea, unspecified: Secondary | ICD-10-CM | POA: Diagnosis not present

## 2017-09-29 DIAGNOSIS — F17210 Nicotine dependence, cigarettes, uncomplicated: Secondary | ICD-10-CM | POA: Diagnosis not present

## 2017-09-29 DIAGNOSIS — G894 Chronic pain syndrome: Secondary | ICD-10-CM | POA: Diagnosis not present

## 2017-09-29 DIAGNOSIS — F419 Anxiety disorder, unspecified: Secondary | ICD-10-CM | POA: Insufficient documentation

## 2017-09-29 DIAGNOSIS — G8929 Other chronic pain: Secondary | ICD-10-CM | POA: Insufficient documentation

## 2017-09-29 DIAGNOSIS — Z79899 Other long term (current) drug therapy: Secondary | ICD-10-CM | POA: Insufficient documentation

## 2017-09-29 DIAGNOSIS — N939 Abnormal uterine and vaginal bleeding, unspecified: Secondary | ICD-10-CM | POA: Diagnosis present

## 2017-09-29 DIAGNOSIS — K219 Gastro-esophageal reflux disease without esophagitis: Secondary | ICD-10-CM | POA: Diagnosis not present

## 2017-09-29 DIAGNOSIS — R102 Pelvic and perineal pain: Secondary | ICD-10-CM

## 2017-09-29 DIAGNOSIS — Z90710 Acquired absence of both cervix and uterus: Secondary | ICD-10-CM | POA: Diagnosis present

## 2017-09-29 DIAGNOSIS — J45909 Unspecified asthma, uncomplicated: Secondary | ICD-10-CM | POA: Insufficient documentation

## 2017-09-29 DIAGNOSIS — N938 Other specified abnormal uterine and vaginal bleeding: Secondary | ICD-10-CM | POA: Diagnosis not present

## 2017-09-29 HISTORY — PX: CYSTOSCOPY: SHX5120

## 2017-09-29 HISTORY — PX: TOTAL LAPAROSCOPIC HYSTERECTOMY WITH SALPINGECTOMY: SHX6742

## 2017-09-29 LAB — POCT I-STAT 4, (NA,K, GLUC, HGB,HCT)
Glucose, Bld: 94 mg/dL (ref 70–99)
HEMATOCRIT: 40 % (ref 36.0–46.0)
HEMOGLOBIN: 13.6 g/dL (ref 12.0–15.0)
POTASSIUM: 3.5 mmol/L (ref 3.5–5.1)
Sodium: 144 mmol/L (ref 135–145)

## 2017-09-29 LAB — URINE DRUG SCREEN, QUALITATIVE (ARMC ONLY)
Amphetamines, Ur Screen: NOT DETECTED
BENZODIAZEPINE, UR SCRN: NOT DETECTED
Cannabinoid 50 Ng, Ur ~~LOC~~: POSITIVE — AB
Cocaine Metabolite,Ur ~~LOC~~: NOT DETECTED
MDMA (ECSTASY) UR SCREEN: NOT DETECTED
Methadone Scn, Ur: NOT DETECTED
OPIATE, UR SCREEN: NOT DETECTED
PHENCYCLIDINE (PCP) UR S: NOT DETECTED
Tricyclic, Ur Screen: NOT DETECTED

## 2017-09-29 LAB — ABO/RH: ABO/RH(D): A NEG

## 2017-09-29 LAB — HEP, RPR, HIV PANEL
HIV Screen 4th Generation wRfx: NONREACTIVE
Hepatitis B Surface Ag: NEGATIVE
RPR: NONREACTIVE

## 2017-09-29 LAB — POCT PREGNANCY, URINE: Preg Test, Ur: NEGATIVE

## 2017-09-29 LAB — PREGNANCY, URINE: Preg Test, Ur: NEGATIVE

## 2017-09-29 SURGERY — HYSTERECTOMY, TOTAL, LAPAROSCOPIC, WITH SALPINGECTOMY
Anesthesia: General

## 2017-09-29 MED ORDER — SUGAMMADEX SODIUM 200 MG/2ML IV SOLN
INTRAVENOUS | Status: DC | PRN
  Administered 2017-09-29: 200 mg via INTRAVENOUS

## 2017-09-29 MED ORDER — HYDROMORPHONE HCL 1 MG/ML IJ SOLN
INTRAMUSCULAR | Status: AC
  Filled 2017-09-29: qty 1

## 2017-09-29 MED ORDER — PROPOFOL 10 MG/ML IV BOLUS
INTRAVENOUS | Status: AC
  Filled 2017-09-29: qty 20

## 2017-09-29 MED ORDER — PROPOFOL 10 MG/ML IV BOLUS
INTRAVENOUS | Status: DC | PRN
  Administered 2017-09-29: 200 mg via INTRAVENOUS

## 2017-09-29 MED ORDER — PROMETHAZINE HCL 25 MG/ML IJ SOLN: 6.2500 mg | INTRAMUSCULAR | Status: DC | PRN

## 2017-09-29 MED ORDER — MENTHOL 3 MG MT LOZG
1.0000 | LOZENGE | OROMUCOSAL | Status: DC | PRN
  Filled 2017-09-29: qty 9

## 2017-09-29 MED ORDER — ACETAMINOPHEN NICU IV SYRINGE 10 MG/ML
INTRAVENOUS | Status: AC
  Filled 2017-09-29: qty 1

## 2017-09-29 MED ORDER — SIMETHICONE 80 MG PO CHEW
80.0000 mg | CHEWABLE_TABLET | Freq: Four times a day (QID) | ORAL | Status: DC | PRN
  Administered 2017-09-29 – 2017-09-30 (×3): 80 mg via ORAL
  Filled 2017-09-29 (×3): qty 1

## 2017-09-29 MED ORDER — FAMOTIDINE 20 MG PO TABS
20.0000 mg | ORAL_TABLET | Freq: Once | ORAL | Status: AC
  Administered 2017-09-29: 20 mg via ORAL

## 2017-09-29 MED ORDER — HYDROMORPHONE HCL 2 MG PO TABS
2.0000 mg | ORAL_TABLET | ORAL | Status: DC | PRN
  Administered 2017-09-30: 2 mg via ORAL
  Filled 2017-09-29: qty 1

## 2017-09-29 MED ORDER — FENTANYL CITRATE (PF) 100 MCG/2ML IJ SOLN
INTRAMUSCULAR | Status: AC
  Administered 2017-09-29: 25 ug via INTRAVENOUS
  Filled 2017-09-29: qty 2

## 2017-09-29 MED ORDER — ONDANSETRON HCL 4 MG/2ML IJ SOLN
INTRAMUSCULAR | Status: AC
  Filled 2017-09-29: qty 2

## 2017-09-29 MED ORDER — MIDAZOLAM HCL 2 MG/2ML IJ SOLN
INTRAMUSCULAR | Status: DC | PRN
  Administered 2017-09-29: 2 mg via INTRAVENOUS

## 2017-09-29 MED ORDER — FENTANYL CITRATE (PF) 100 MCG/2ML IJ SOLN
INTRAMUSCULAR | Status: DC | PRN
  Administered 2017-09-29 (×2): 50 ug via INTRAVENOUS
  Administered 2017-09-29: 100 ug via INTRAVENOUS

## 2017-09-29 MED ORDER — MEPERIDINE HCL 50 MG/ML IJ SOLN: 6.2500 mg | INTRAMUSCULAR | Status: DC | PRN

## 2017-09-29 MED ORDER — BUPIVACAINE HCL 0.5 % IJ SOLN
INTRAMUSCULAR | Status: DC | PRN
  Administered 2017-09-29: 15 mL

## 2017-09-29 MED ORDER — HYDROMORPHONE HCL 1 MG/ML IJ SOLN
INTRAMUSCULAR | Status: AC
  Administered 2017-09-29: 0.5 mg via INTRAVENOUS
  Filled 2017-09-29: qty 1

## 2017-09-29 MED ORDER — SUGAMMADEX SODIUM 200 MG/2ML IV SOLN
INTRAVENOUS | Status: AC
  Filled 2017-09-29: qty 2

## 2017-09-29 MED ORDER — DEXAMETHASONE SODIUM PHOSPHATE 10 MG/ML IJ SOLN
INTRAMUSCULAR | Status: DC | PRN
  Administered 2017-09-29: 10 mg via INTRAVENOUS

## 2017-09-29 MED ORDER — IBUPROFEN 600 MG PO TABS
600.0000 mg | ORAL_TABLET | Freq: Four times a day (QID) | ORAL | Status: DC | PRN
  Administered 2017-09-29 – 2017-09-30 (×3): 600 mg via ORAL
  Filled 2017-09-29 (×3): qty 1

## 2017-09-29 MED ORDER — ROCURONIUM BROMIDE 100 MG/10ML IV SOLN
INTRAVENOUS | Status: DC | PRN
  Administered 2017-09-29: 5 mg via INTRAVENOUS
  Administered 2017-09-29: 45 mg via INTRAVENOUS
  Administered 2017-09-29: 30 mg via INTRAVENOUS

## 2017-09-29 MED ORDER — MORPHINE SULFATE (PF) 2 MG/ML IV SOLN
1.0000 mg | INTRAVENOUS | Status: DC | PRN
  Administered 2017-09-30: 2 mg via INTRAVENOUS
  Filled 2017-09-29: qty 1

## 2017-09-29 MED ORDER — PHENYLEPHRINE HCL 10 MG/ML IJ SOLN
INTRAMUSCULAR | Status: DC | PRN
  Administered 2017-09-29 (×4): 100 ug via INTRAVENOUS

## 2017-09-29 MED ORDER — FENTANYL CITRATE (PF) 100 MCG/2ML IJ SOLN
INTRAMUSCULAR | Status: AC
  Filled 2017-09-29: qty 2

## 2017-09-29 MED ORDER — KETOROLAC TROMETHAMINE 30 MG/ML IJ SOLN
INTRAMUSCULAR | Status: DC | PRN
  Administered 2017-09-29: 30 mg via INTRAVENOUS

## 2017-09-29 MED ORDER — LACTATED RINGERS IV SOLN
INTRAVENOUS | Status: DC
  Administered 2017-09-29: 11:00:00 via INTRAVENOUS

## 2017-09-29 MED ORDER — DEXTROSE-NACL 5-0.45 % IV SOLN
INTRAVENOUS | Status: DC
  Administered 2017-09-29 (×2): via INTRAVENOUS

## 2017-09-29 MED ORDER — GENTAMICIN SULFATE 40 MG/ML IJ SOLN
INTRAVENOUS | Status: DC
  Filled 2017-09-29: qty 13.25

## 2017-09-29 MED ORDER — DEXTROSE 5 % IV SOLN
5.0000 mg/kg | INTRAVENOUS | Status: AC
  Administered 2017-09-29: 510 mg via INTRAVENOUS
  Filled 2017-09-29: qty 12.75

## 2017-09-29 MED ORDER — HYDROMORPHONE HCL 1 MG/ML IJ SOLN
INTRAMUSCULAR | Status: DC | PRN
  Administered 2017-09-29 (×2): 0.5 mg via INTRAVENOUS

## 2017-09-29 MED ORDER — ACETAMINOPHEN 325 MG PO TABS: 325.0000 mg | ORAL_TABLET | ORAL | Status: DC | PRN

## 2017-09-29 MED ORDER — EPHEDRINE SULFATE 50 MG/ML IJ SOLN
INTRAMUSCULAR | Status: DC | PRN
  Administered 2017-09-29: 2.5 mg via INTRAVENOUS

## 2017-09-29 MED ORDER — LIDOCAINE HCL (CARDIAC) PF 100 MG/5ML IV SOSY
PREFILLED_SYRINGE | INTRAVENOUS | Status: DC | PRN
  Administered 2017-09-29: 100 mg via INTRAVENOUS

## 2017-09-29 MED ORDER — FAMOTIDINE 20 MG PO TABS
ORAL_TABLET | ORAL | Status: AC
  Filled 2017-09-29: qty 1

## 2017-09-29 MED ORDER — ONDANSETRON HCL 4 MG/2ML IJ SOLN
INTRAMUSCULAR | Status: DC | PRN
  Administered 2017-09-29: 4 mg via INTRAVENOUS

## 2017-09-29 MED ORDER — GLYCOPYRROLATE 0.2 MG/ML IJ SOLN
INTRAMUSCULAR | Status: DC | PRN
  Administered 2017-09-29: 0.2 mg via INTRAVENOUS

## 2017-09-29 MED ORDER — MIDAZOLAM HCL 2 MG/2ML IJ SOLN
INTRAMUSCULAR | Status: AC
  Filled 2017-09-29: qty 2

## 2017-09-29 MED ORDER — METRONIDAZOLE IN NACL 5-0.79 MG/ML-% IV SOLN
500.0000 mg | INTRAVENOUS | Status: AC
  Administered 2017-09-29: 500 mg via INTRAVENOUS
  Filled 2017-09-29: qty 100

## 2017-09-29 MED ORDER — HYDROMORPHONE HCL 1 MG/ML IJ SOLN
0.5000 mg | INTRAMUSCULAR | Status: DC | PRN
  Administered 2017-09-29: 0.5 mg via INTRAVENOUS

## 2017-09-29 MED ORDER — ACETAMINOPHEN 160 MG/5ML PO SOLN
325.0000 mg | ORAL | Status: DC | PRN
  Filled 2017-09-29: qty 20.3

## 2017-09-29 MED ORDER — ACETAMINOPHEN 10 MG/ML IV SOLN
INTRAVENOUS | Status: DC | PRN
  Administered 2017-09-29: 1000 mg via INTRAVENOUS

## 2017-09-29 MED ORDER — KETOROLAC TROMETHAMINE 30 MG/ML IJ SOLN
INTRAMUSCULAR | Status: AC
  Filled 2017-09-29: qty 1

## 2017-09-29 MED ORDER — DEXAMETHASONE SODIUM PHOSPHATE 10 MG/ML IJ SOLN
INTRAMUSCULAR | Status: AC
Start: 2017-09-29 — End: ?
  Filled 2017-09-29: qty 1

## 2017-09-29 MED ORDER — FENTANYL CITRATE (PF) 100 MCG/2ML IJ SOLN
25.0000 ug | INTRAMUSCULAR | Status: DC | PRN
  Administered 2017-09-29 (×4): 25 ug via INTRAVENOUS

## 2017-09-29 SURGICAL SUPPLY — 48 items
BAG URINE DRAINAGE (UROLOGICAL SUPPLIES) ×3 IMPLANT
BLADE SURG SZ11 CARB STEEL (BLADE) ×3 IMPLANT
CANISTER SUCT 1200ML W/VALVE (MISCELLANEOUS) ×3 IMPLANT
CATH FOLEY 2WAY  5CC 16FR (CATHETERS) ×1
CATH URTH 16FR FL 2W BLN LF (CATHETERS) ×2 IMPLANT
CHLORAPREP W/TINT 26ML (MISCELLANEOUS) ×3 IMPLANT
COUNTER NEEDLE 20/40 LG (NEEDLE) ×3 IMPLANT
DEFOGGER SCOPE WARMER CLEARIFY (MISCELLANEOUS) ×3 IMPLANT
DERMABOND ADVANCED (GAUZE/BANDAGES/DRESSINGS) ×1
DERMABOND ADVANCED .7 DNX12 (GAUZE/BANDAGES/DRESSINGS) ×2 IMPLANT
GLOVE BIO SURGEON STRL SZ7 (GLOVE) ×12 IMPLANT
GLOVE INDICATOR 7.5 STRL GRN (GLOVE) ×3 IMPLANT
GOWN STRL REUS W/ TWL LRG LVL3 (GOWN DISPOSABLE) ×4 IMPLANT
GOWN STRL REUS W/ TWL XL LVL3 (GOWN DISPOSABLE) ×2 IMPLANT
GOWN STRL REUS W/TWL LRG LVL3 (GOWN DISPOSABLE) ×2
GOWN STRL REUS W/TWL XL LVL3 (GOWN DISPOSABLE) ×1
IRRIGATION STRYKERFLOW (MISCELLANEOUS) ×2 IMPLANT
IRRIGATOR STRYKERFLOW (MISCELLANEOUS) ×3
IV LACTATED RINGERS 1000ML (IV SOLUTION) ×3 IMPLANT
IV NS 1000ML (IV SOLUTION) ×1
IV NS 1000ML BAXH (IV SOLUTION) ×2 IMPLANT
KIT PINK PAD W/HEAD ARE REST (MISCELLANEOUS) ×3
KIT PINK PAD W/HEAD ARM REST (MISCELLANEOUS) ×2 IMPLANT
LABEL OR SOLS (LABEL) ×3 IMPLANT
MANIPULATOR VCARE LG CRV RETR (MISCELLANEOUS) IMPLANT
MANIPULATOR VCARE SML CRV RETR (MISCELLANEOUS) IMPLANT
MANIPULATOR VCARE STD CRV RETR (MISCELLANEOUS) ×3 IMPLANT
NS IRRIG 1000ML POUR BTL (IV SOLUTION) IMPLANT
NS IRRIG 500ML POUR BTL (IV SOLUTION) ×3 IMPLANT
OCCLUDER COLPOPNEUMO (BALLOONS) ×3 IMPLANT
PACK GYN LAPAROSCOPIC (MISCELLANEOUS) ×3 IMPLANT
PAD OB MATERNITY 4.3X12.25 (PERSONAL CARE ITEMS) ×3 IMPLANT
PAD PREP 24X41 OB/GYN DISP (PERSONAL CARE ITEMS) ×3 IMPLANT
SET CYSTO W/LG BORE CLAMP LF (SET/KITS/TRAYS/PACK) ×3 IMPLANT
SHEARS HARMONIC ACE PLUS 36CM (ENDOMECHANICALS) ×3 IMPLANT
SLEEVE ENDOPATH XCEL 5M (ENDOMECHANICALS) ×6 IMPLANT
SPONGE LAP 18X18 RF (DISPOSABLE) ×3 IMPLANT
SPONGE XRAY 4X4 16PLY STRL (MISCELLANEOUS) ×3 IMPLANT
STRAP SAFETY 5IN WIDE (MISCELLANEOUS) ×3 IMPLANT
SURGILUBE 2OZ TUBE FLIPTOP (MISCELLANEOUS) ×3 IMPLANT
SUT VIC AB 0 CT1 27 (SUTURE) ×1
SUT VIC AB 0 CT1 27XCR 8 STRN (SUTURE) ×2 IMPLANT
SUT VIC AB 2-0 UR6 27 (SUTURE) ×3 IMPLANT
SUT VIC AB 4-0 FS2 27 (SUTURE) ×6 IMPLANT
SYR 10ML LL (SYRINGE) ×3 IMPLANT
SYR 50ML LL SCALE MARK (SYRINGE) ×3 IMPLANT
TROCAR XCEL NON-BLD 5MMX100MML (ENDOMECHANICALS) ×3 IMPLANT
TUBING INSUF HEATED (TUBING) ×3 IMPLANT

## 2017-09-29 NOTE — Op Note (Signed)
Preoperative Diagnosis: 1) 32 y.o. with chronic pelvic pain 2) Abnormal uterine bleeding  Postoperative Diagnosis: 1) 32 y.o. with chronic pelvic pain 2) Abnormal uterine bleeding   Operation Performed: Total laparoscopic hysterectomy, bilateral salpingectomy, and cystoscopy  Indication:  Chronic pelvic pain, abnormal uterine bleeding failing medical management  Surgeon: Vena AustriaAndreas Olegario Emberson, MD  Assistant: Vena AustriaAndreas Ola Fawver, MD  Anesthesia: General  Preoperative Antibiotics: none  Estimated Blood Loss: 50 mL  Drains or Tubes: none  Implants: none  Specimens Removed: Uterus, cervix, and bilateral fallopian tubes  Complications: none  Intraoperative Findings: Normal uterus, tubes, and ovaries  Patient Condition: stable  Procedure in Detail:  Patient was taken to the operating room where she was administered general anesthesia.  She was positioned in the dorsal lithotomy position utilizing Allen stirups, prepped and draped in the usual sterile fashion.  Prior to proceeding with procedure a time out was performed.  Attention was turned to the patient's pelvis.  An indwelling foley catheter was placed to decompress the patient's bladder.  An operative speculum was placed to allow visualization of the cervix.  The anterior lip of the cervix was grasped with a single tooth tenaculum, and a medium V-care uterine manipulator was placed to allow manipulation of the uterus.  The operative speculum and single tooth tenaculum were then removed.  Attention was turned to the patient's abdomen.  The umbilicus was infiltrated with 1% Sensorcaine, before making a stab incision using an 11 blade scalpel.  A 5mm Excel trocar was then used to gain direct entry into the peritoneal cavity utilizing the camera to visualize progress of the trocar during placement.  Once peritoneal entry had been achieved, insufflation was started and pneumoperitoneum established at a pressure of 15mmHg.    One left and one  right lower quadrant site were then injected with 1% Sensorcaine and a stab incision was made using an 11 blade scalpel.  Two additional 5mm Excel trocars were placed through these incisions under direct visualization. General inspection of the abdomen revealed the above noted findings.   The right tube was identified and grasped at its fimbriated end.  The tube was transected from its attachments to the ovary and mesosalpinx using a 5mm Harmonic scalpel.  The utero ovarian ligament was identified ligated and transected using the Harmonic scalpel. The round ligament was then likewise ligated and transected.  The anterior leaf of the broad ligament was dissected down to the level of the internal cervical os and a bladder flap was started.  The posterior leaf of the broad ligament was dissected down to the utero-sacral ligament.  The uterine artery was skeletonized before being ligated and transected using the Harmonic scalpel with cephelad pressure applied to the V-care device to assure lateralization of the ureter.  A bite was then taken with Harmonic medial to transected portio of uterine artery to further lateralize the ureter and vessel off the V-care cup.  The patient left adnexal structures were then dissected in similar fashion.  The bladder flap was completed and the bladder mobilized off the V-care cup.  An anterior colpotomy was scores and carried around in a clockwise fashion to free the specimen, which was then removed vaginally.  Inspection revealed all pedicles to be hemostatic before proceed with vaginal closure.  Attention was turned to the patient pelvis.  An operative speculum was placed and the anterior and posterior portion of the vaginal cuff were tagged with long Alice clamps.  The cuff was closed using 0 Vicryl using interrupted figure  of eight stitches.  The cuff was hemsotatic at the conclusion of closure without visible or palpable defects.  The indwelling foley catheter was removed.   Cystoscopy was performed noting and intact bladder dome as well as brisk efflux of urine from bother ureteral orifices.  The cystoscopy was removed and the indwelling foley catheter was replaced.  Pneumoperitoneum was re-established, the pelvis was irrigated and all pedicles were once again inspected and noted to be hemostatic.  The cuff appeared well closed and free of bowl or other tissue that may have inadvertently been incorporated into the vaginal closure.  Additional hemostatic agents were not applied.      Pneumoperitoneum was evacuated and trocars were removed.  All trocar sites were then dressed with surgical skin glue.  Sponge needle and instrument counts were correct time two.  The patient tolerated the procedure well and was taken to the recovery room in stable condition.

## 2017-09-29 NOTE — H&P (Signed)
Date of Initial H&P: 09/28/17  History reviewed, patient examined, no change in status, stable for surgery. - has clindamycin allergy as well as PCN allergy so antibiotics changed to gentamycin 5mg /kg and metronidazole 500mg  IV

## 2017-09-29 NOTE — Anesthesia Preprocedure Evaluation (Signed)
Anesthesia Evaluation  Patient identified by MRN, date of birth, ID band Patient awake    Reviewed: Allergy & Precautions, H&P , NPO status , reviewed documented beta blocker date and time   History of Anesthesia Complications (+) Family history of anesthesia reaction and history of anesthetic complications  Airway Mallampati: II  TM Distance: >3 FB Neck ROM: full    Dental  (+) Poor Dentition, Chipped, Missing   Pulmonary asthma , pneumonia, Current Smoker,    Pulmonary exam normal        Cardiovascular + Peripheral Vascular Disease  Normal cardiovascular exam     Neuro/Psych  Headaches, PSYCHIATRIC DISORDERS Anxiety    GI/Hepatic GERD  Medicated and Controlled,  Endo/Other    Renal/GU      Musculoskeletal  (+) Arthritis ,   Abdominal   Peds  Hematology   Anesthesia Other Findings Past Medical History: No date: Arthritis No date: Asthma     Comment:  WELL CONTROLLED 2007: Brain aneurysm     Comment:  NEUROLOGY NOTE DOES NOT MENTION ANEURYSM BUT PT STATES               SHE DID NOT HAVE TO HAVE SURGERY No date: Complication of anesthesia     Comment:  FOR 1 C-SECTION PT WAS ITCHING AND VERY RED ON HER FACE No date: Family history of adverse reaction to anesthesia     Comment:  brother, neice and nephew got red in face with hives No date: Gallstones No date: GERD (gastroesophageal reflux disease) No date: Headache     Comment:  CHRONIC HEADACHES No date: History of chronic cough     Comment:  DRY No date: Pneumonia No date: PTSD (post-traumatic stress disorder) No date: PTSD (post-traumatic stress disorder) No date: Thyroid condition     Comment:  PT WAS JUST TOLD ON 08-25-17 THAT SHE HAS A THYROID               PROBLEM AND IS GOING TO F/U WITH ENDOCRINOLOGIST IN 2               WEEKS Past Surgical History: No date: CESAREAN SECTION     Comment:  x3 09/01/2017: HYSTEROSCOPY W/D&C; N/A     Comment:   Procedure: DILATATION AND CURETTAGE /HYSTEROSCOPY;                Surgeon: Vena AustriaStaebler, Andreas, MD;  Location: [facility] ORS;                Service: Gynecology;  Laterality: N/A; 09/01/2017: LAPAROSCOPY; N/A     Comment:  Procedure: LAPAROSCOPY OPERATIVE with biopsy;  Surgeon:               Vena AustriaStaebler, Andreas, MD;  Location: [facility] ORS;  Service:               Gynecology;  Laterality: N/A; BMI    Body Mass Index:  39.86 kg/m     Reproductive/Obstetrics                             Anesthesia Physical Anesthesia Plan  ASA: III  Anesthesia Plan: General ETT   Post-op Pain Management:    Induction: Intravenous  PONV Risk Score and Plan: 4 or greater and Ondansetron, Treatment may vary due to age or medical condition, Midazolam, Dexamethasone and Metaclopromide  Airway Management Planned:   Additional Equipment:   Intra-op Plan:   Post-operative Plan: Extubation in OR  Informed Consent: I have reviewed the patients History and Physical, chart, labs and discussed the procedure including the risks, benefits and alternatives for the proposed anesthesia with the patient or authorized representative who has indicated his/her understanding and acceptance.   Dental Advisory Given  Plan Discussed with: CRNA  Anesthesia Plan Comments:         Anesthesia Quick Evaluation

## 2017-09-29 NOTE — Anesthesia Post-op Follow-up Note (Signed)
Anesthesia QCDR form completed.        

## 2017-09-29 NOTE — Transfer of Care (Signed)
Immediate Anesthesia Transfer of Care Note  Patient: Elizabeth DallasSusan Bognar  Procedure(s) Performed: TOTAL LAPAROSCOPIC HYSTERECTOMY WITH SALPINGECTOMY (Bilateral ) CYSTOSCOPY (N/A )  Patient Location: PACU  Anesthesia Type:General  Level of Consciousness: awake  Airway & Oxygen Therapy: Patient Spontanous Breathing  Post-op Assessment: Report given to RN  Post vital signs: stable  Last Vitals:  Vitals Value Taken Time  BP 116/74 09/29/2017 12:57 PM  Temp 36.7 C 09/29/2017 12:57 PM  Pulse 94 09/29/2017  1:02 PM  Resp 19 09/29/2017  1:02 PM  SpO2 97 % 09/29/2017  1:02 PM  Vitals shown include unvalidated device data.  Last Pain:  Vitals:   09/29/17 0821  PainSc: 8       Patients Stated Pain Goal: 3 (09/29/17 [id number])  Complications: No apparent anesthesia complications

## 2017-09-29 NOTE — Anesthesia Procedure Notes (Signed)
Procedure Name: Intubation Date/Time: 09/29/2017 10:42 AM Performed by: Geraldine Contras, CRNA Pre-anesthesia Checklist: Patient identified, Emergency Drugs available, Suction available, Patient being monitored and Timeout performed Patient Re-evaluated:Patient Re-evaluated prior to induction Oxygen Delivery Method: Circle system utilized Preoxygenation: Pre-oxygenation with 100% oxygen Induction Type: IV induction Ventilation: Mask ventilation without difficulty Laryngoscope Size: Mac and 3 Grade View: Grade I Tube type: Oral Tube size: 7.0 mm Number of attempts: 1 Airway Equipment and Method: Stylet Placement Confirmation: ETT inserted through vocal cords under direct vision,  positive ETCO2 and breath sounds checked- equal and bilateral Secured at: 21 cm Tube secured with: Tape Dental Injury: Teeth and Oropharynx as per pre-operative assessment

## 2017-09-30 ENCOUNTER — Encounter: Payer: Self-pay | Admitting: Obstetrics and Gynecology

## 2017-09-30 DIAGNOSIS — N939 Abnormal uterine and vaginal bleeding, unspecified: Secondary | ICD-10-CM | POA: Diagnosis not present

## 2017-09-30 LAB — CHLAMYDIA/GONOCOCCUS/TRICHOMONAS, NAA
Chlamydia by NAA: NEGATIVE
GONOCOCCUS BY NAA: NEGATIVE
Trich vag by NAA: NEGATIVE

## 2017-09-30 LAB — CBC
HCT: 37.6 % (ref 35.0–47.0)
Hemoglobin: 12.8 g/dL (ref 12.0–16.0)
MCH: 30.2 pg (ref 26.0–34.0)
MCHC: 33.9 g/dL (ref 32.0–36.0)
MCV: 89.1 fL (ref 80.0–100.0)
Platelets: 172 10*3/uL (ref 150–440)
RBC: 4.22 MIL/uL (ref 3.80–5.20)
RDW: 13.2 % (ref 11.5–14.5)
WBC: 15.3 10*3/uL — ABNORMAL HIGH (ref 3.6–11.0)

## 2017-09-30 LAB — BASIC METABOLIC PANEL
Anion gap: 10 (ref 5–15)
BUN: 7 mg/dL (ref 6–20)
CALCIUM: 8.7 mg/dL — AB (ref 8.9–10.3)
CO2: 21 mmol/L — AB (ref 22–32)
CREATININE: 0.46 mg/dL (ref 0.44–1.00)
Chloride: 109 mmol/L (ref 98–111)
GFR calc non Af Amer: >60 mL/min (ref >60)
Glucose, Bld: 142 mg/dL — ABNORMAL HIGH (ref 70–99)
Potassium: 3.7 mmol/L (ref 3.5–5.1)
SODIUM: 140 mmol/L (ref 135–145)

## 2017-09-30 MED ORDER — NAPROXEN 500 MG PO TABS: 500.0000 mg | ORAL_TABLET | Freq: Two times a day (BID) | ORAL | 0 refills | Status: DC | PRN

## 2017-09-30 MED ORDER — HYDROMORPHONE HCL 2 MG PO TABS: 2.0000 mg | ORAL_TABLET | ORAL | 0 refills | Status: DC | PRN

## 2017-09-30 NOTE — Progress Notes (Signed)
Discharge order received from doctor.  Reviewed discharge instructions and prescriptions with patient and answered all questions. Follow up appointment given. Patient verbalized understanding. Patient discharged home via wheelchair by nursing/auxillary.    Elie Gragert Garner, RN  

## 2017-09-30 NOTE — Discharge Summary (Signed)
Physician Discharge Summary  Patient ID: Elizabeth Spencer MRN: [id number] DOB/AGE: 1985-07-22 32 y.o.  Admit date: 09/29/2017 Discharge date: 09/30/2017  Admission Diagnoses: Abnormal uterine bleeding, dysmenorrhea/pelvic pain  Discharge Diagnoses:  Active Problems:   S/P laparoscopic hysterectomy   Discharged Condition: good  Hospital Course: Patient went scheduled and uncomplicated TLH, BS, cystoscopy on 09/29/2017.  She did well postoperatively with stable vitals and repeat labs on POD1.  She was meeting all postoperative goals and was discharged in stable condition  Consults: None  Significant Diagnostic Studies: Results for orders placed or performed during the hospital encounter of 09/29/17 (from the past 24 hour(s))  Pregnancy, urine POC     Status: None   Collection Time: 09/29/17  8:15 AM  Result Value Ref Range   Preg Test, Ur NEGATIVE NEGATIVE  Urine Drug Screen, Qualitative ([facility] only)     Status: Abnormal   Collection Time: 09/29/17  8:16 AM  Result Value Ref Range   Tricyclic, Ur Screen NONE DETECTED NONE DETECTED   Amphetamines, Ur Screen NONE DETECTED NONE DETECTED   MDMA (Ecstasy)Ur Screen NONE DETECTED NONE DETECTED   Cocaine Metabolite,Ur [place] NONE DETECTED NONE DETECTED   Opiate, Ur Screen NONE DETECTED NONE DETECTED   Phencyclidine (PCP) Ur S NONE DETECTED NONE DETECTED   Cannabinoid 50 Ng, Ur [place] POSITIVE (A) NONE DETECTED   Barbiturates, Ur Screen (A) NONE DETECTED    Result not available. Reagent lot number recalled by manufacturer.   Benzodiazepine, Ur Scrn NONE DETECTED NONE DETECTED   Methadone Scn, Ur NONE DETECTED NONE DETECTED  Pregnancy, urine     Status: None   Collection Time: 09/29/17  8:16 AM  Result Value Ref Range   Preg Test, Ur NEGATIVE NEGATIVE  ABO/Rh     Status: None   Collection Time: 09/29/17  8:32 AM  Result Value Ref Range   ABO/RH(D)      A NEG Performed at [facility], [address] [place], [place] [address]    I-STAT 4, (NA,K, GLUC, HGB,HCT)     Status: None   Collection Time: 09/29/17  8:32 AM  Result Value Ref Range   Sodium 144 135 - 145 mmol/L   Potassium 3.5 3.5 - 5.1 mmol/L   Glucose, Bld 94 70 - 99 mg/dL   HCT 69.6 29.5 - 28.4 %   Hemoglobin 13.6 12.0 - 15.0 g/dL  CBC     Status: Abnormal   Collection Time: 09/30/17  5:05 AM  Result Value Ref Range   WBC 15.3 (H) 3.6 - 11.0 K/uL   RBC 4.22 3.80 - 5.20 MIL/uL   Hemoglobin 12.8 12.0 - 16.0 g/dL   HCT 13.2 44.0 - 10.2 %   MCV 89.1 80.0 - 100.0 fL   MCH 30.2 26.0 - 34.0 pg   MCHC 33.9 32.0 - 36.0 g/dL   RDW 72.5 36.6 - 44.0 %   Platelets 172 150 - 440 K/uL  Basic metabolic panel     Status: Abnormal   Collection Time: 09/30/17  5:05 AM  Result Value Ref Range   Sodium 140 135 - 145 mmol/L   Potassium 3.7 3.5 - 5.1 mmol/L   Chloride 109 98 - 111 mmol/L   CO2 21 (L) 22 - 32 mmol/L   Glucose, Bld 142 (H) 70 - 99 mg/dL   BUN 7 6 - 20 mg/dL   Creatinine, Ser 3.47 0.44 - 1.00 mg/dL   Calcium 8.7 (L) 8.9 - 10.3 mg/dL   GFR calc non Af  Amer >60 >60 mL/min   GFR calc Af Amer >60 >60 mL/min   Anion gap 10 5 - 15     Treatments: TLH, BS, cystoscopy  Discharge Exam: Blood pressure 106/73, pulse 73, temperature 97.8 F (36.6 C), temperature source Oral, resp. rate 20, height 5\' 3"  (1.6 m), weight 225 lb (102.1 kg), SpO2 99 %. General appearance: alert, appears stated age and no distress Resp: no increased work of breathing GI: soft, appropriately tender, trocar sites D/C/I  Disposition: Discharge disposition: 01-Home or Self Care       Discharge Instructions    Call MD for:   Complete by:  As directed    Heavy vaginal bleeding greater than 1 pad an hour   Call MD for:  difficulty breathing, headache or visual disturbances   Complete by:  As directed    Call MD for:  extreme fatigue   Complete by:  As directed    Call MD for:  hives   Complete by:  As directed    Call MD for:  persistant dizziness or light-headedness    Complete by:  As directed    Call MD for:  persistant nausea and vomiting   Complete by:  As directed    Call MD for:  redness, tenderness, or signs of infection (pain, swelling, redness, odor or green/yellow discharge around incision site)   Complete by:  As directed    Call MD for:  severe uncontrolled pain   Complete by:  As directed    Call MD for:  temperature >100.4   Complete by:  As directed    Diet general   Complete by:  As directed    Discharge wound care:   Complete by:  As directed    You may apply a light dressing for minor discharge from the incisions or to keep waistbands of clothing from rubbing.    You may shower, use soap on your incision.  Avoid baths or soaking the incision in the first 6 weeks following your surgery..   Driving restriction   Complete by:  As directed    Avoid driving for at least 2 weeks or while taking prescription narcotics.   Lifting restrictions   Complete by:  As directed    Weight restriction of 10lbs for 6 weeks.   Sexual acrtivity   Complete by:  As directed    No intercourse, tampons, or anything vaginally for 6 weeks     Allergies as of 09/30/2017      Reactions   Azithromycin Swelling, Other (See Comments)   Meloxicam Other (See Comments)   Burns up from the inside   Neurontin [gabapentin] Other (See Comments)   Makes her pass out and not remember what happened prior to taking med.   Oxycodone Nausea And Vomiting   dizziness   Penicillins Anaphylaxis   Patient allergic to all "cillins" Has patient had a PCN reaction causing immediate rash, facial/tongue/throat swelling, SOB or lightheadedness with hypotension: Yes Has patient had a PCN reaction causing severe rash involving mucus membranes or skin necrosis: Yes Has patient had a PCN reaction that required hospitalization: No Has patient had a PCN reaction occurring within the last 10 years: Yes If all of the above answers are "NO", then may proceed with Cephalosporin use.    Clindamycin/lincomycin Swelling   Other Hives, Other (See Comments)   Berry flavored food and drinks   Diphenhydramine Hcl Rash   Doxycycline Rash      Medication List  STOP taking these medications   naproxen sodium 220 MG tablet Commonly known as:  ALEVE   norethindrone 5 MG tablet Commonly known as:  AYGESTIN     TAKE these medications   albuterol 108 (90 Base) MCG/ACT inhaler Commonly known as:  PROVENTIL HFA;VENTOLIN HFA Inhale 2 puffs into the lungs every 6 (six) hours as needed for wheezing or shortness of breath.   CENTRUM ADULTS PO Take 1 tablet by mouth at bedtime.   cetirizine 10 MG tablet Commonly known as:  ZYRTEC Take 10 mg by mouth at bedtime.   EPIPEN 2-PAK 0.3 mg/0.3 mL Soaj injection Generic drug:  EPINEPHrine INJECT INTO THIGH MUSCLE THROUGH CLOTHES AS NEEDED SEVERE ALLERGIC REACTION   HYDROmorphone 2 MG tablet Commonly known as:  DILAUDID Take 1-2 tablets (2-4 mg total) by mouth every 4 (four) hours as needed for moderate pain or severe pain.   ketoconazole 2 % shampoo Commonly known as:  NIZORAL SHAMPOO DAILY,AFTER UNDER CONTROL ONCE WEEKLY.APPLY & LATHER-LEAVE ON FOR 5MIN BEFORE WASHING OFF   naproxen 500 MG tablet Commonly known as:  NAPROSYN Take 1 tablet (500 mg total) by mouth 2 (two) times daily as needed for moderate pain.   omeprazole 20 MG capsule Commonly known as:  PRILOSEC Take 20 mg by mouth at bedtime.   RETIN-A 0.025 % cream Generic drug:  tretinoin APPLY PEA SIZE AMOUNT TO FACE AT NIGHT   triamcinolone cream 0.1 % Commonly known as:  KENALOG Apply topically once daily as needed for psoriasis [facility]/eczema            Discharge Care Instructions  (From admission, onward)        Start     Ordered   09/30/17 0000  Discharge wound care:    Comments:  You may apply a light dressing for minor discharge from the incisions or to keep waistbands of clothing from rubbing.    You may shower, use soap on your incision.  Avoid  baths or soaking the incision in the first 6 weeks following your surgery.Marland Kitchen.   09/30/17 [id number]     Follow-up Information    Vena AustriaStaebler, Londell Noll, MD Follow up in 2 week(s).   Specialty:  Obstetrics and Gynecology Why:  postop Contact information: [address] [place] [address] [phone]           Signed: Vena Austriandreas Samanatha Brammer 09/30/2017, 7:39 AM

## 2017-09-30 NOTE — Discharge Instructions (Signed)
Please call your doctor or return to the ER if you experience any chest pains, shortness of breath, dizziness, visual changes, fever greater than 101, any heavy bleeding, large clots, or foul smelling discharge, any worsening abdominal pain and cramping that is not controlled by pain medication. No tampons, enemas, douches, or sexual intercourse for 6 weeks. Also avoid tub baths, hot tubs, or swimming for 6 weeks.    No strenuous activity or heavy lifting for 6 weeks No driving for 1-2 weeks  Pelvic rest for 6 weeks   Check your incisions daily for any signs of infections such as redness, warmth, swelling, increased pain, or pus/ foul smelling drainage

## 2017-10-02 LAB — SURGICAL PATHOLOGY

## 2017-10-10 ENCOUNTER — Ambulatory Visit (INDEPENDENT_AMBULATORY_CARE_PROVIDER_SITE_OTHER): Payer: Medicaid Other | Admitting: Obstetrics and Gynecology

## 2017-10-10 ENCOUNTER — Encounter: Payer: Self-pay | Admitting: Obstetrics and Gynecology

## 2017-10-10 VITALS — BP 122/62 | HR 109 | Ht 63.0 in | Wt 224.0 lb

## 2017-10-10 DIAGNOSIS — Z4889 Encounter for other specified surgical aftercare: Secondary | ICD-10-CM

## 2017-10-10 NOTE — Anesthesia Postprocedure Evaluation (Signed)
Anesthesia Post Note  Patient: Elizabeth Spencer  Procedure(s) Performed: TOTAL LAPAROSCOPIC HYSTERECTOMY WITH SALPINGECTOMY (Bilateral ) CYSTOSCOPY (N/A )  Patient location during evaluation: PACU Anesthesia Type: General Level of consciousness: awake and alert Pain management: pain level controlled Vital Signs Assessment: post-procedure vital signs reviewed and stable Respiratory status: spontaneous breathing, nonlabored ventilation and respiratory function stable Cardiovascular status: blood pressure returned to baseline and stable Postop Assessment: no apparent nausea or vomiting Anesthetic complications: no     Last Vitals:  Vitals:   09/30/17 0300 09/30/17 0826  BP: 106/73 116/67  Pulse: 73 67  Resp: 20 18  Temp: 36.6 C 36.8 C  SpO2: 99% 100%    Last Pain:  Vitals:   09/30/17 1025  TempSrc:   PainSc: 7                  Ansar Skoda T [place]

## 2017-10-10 NOTE — Progress Notes (Signed)
Postoperative Follow-up Patient presents post op from [place], BS, cysto 1weeks ago for abnormal uterine bleeding, dysmenorrhea, pelvic pain.  Subjective: Patient reports some improvement in her preop symptoms. Eating a regular diet without difficulty. Pain is controlled without any medications.  Activity: normal activities of daily living.  Objective: Blood pressure 122/62, pulse (!) 109, height 5' 3" (1.6 m), weight 224 lb (101.6 kg).  Gen: NAD HEENT: normocephalic, anicteric Pulmonary: no increased work of breathing Abdomen: soft, non-tender, non-distended, trocar sites D/C/I  Admission on 09/29/2017, Discharged on 09/30/2017  Component Date Value Ref Range Status  . Tricyclic, Ur Screen 28/00/3491 NONE DETECTED  NONE DETECTED Final  . Amphetamines, Ur Screen 09/29/2017 NONE DETECTED  NONE DETECTED Final  . MDMA (Ecstasy)Ur Screen 09/29/2017 NONE DETECTED  NONE DETECTED Final  . Cocaine Metabolite,Ur [place] 09/29/2017 NONE DETECTED  NONE DETECTED Final  . Opiate, Ur Screen 09/29/2017 NONE DETECTED  NONE DETECTED Final  . Phencyclidine (PCP) Ur S 09/29/2017 NONE DETECTED  NONE DETECTED Final  . Cannabinoid 50 Ng, Ur [place] 09/29/2017 POSITIVE* NONE DETECTED Final  . Barbiturates, Ur Screen 09/29/2017 Result not available. Reagent lot number recalled by manufacturer.* NONE DETECTED Final  . Benzodiazepine, Ur Scrn 09/29/2017 NONE DETECTED  NONE DETECTED Final  . Methadone Scn, Ur 09/29/2017 NONE DETECTED  NONE DETECTED Final   Comment: (NOTE) Tricyclics + metabolites, urine    Cutoff 1000 ng/mL Amphetamines + metabolites, urine  Cutoff 1000 ng/mL MDMA (Ecstasy), urine              Cutoff 500 ng/mL Cocaine Metabolite, urine          Cutoff 300 ng/mL Opiate + metabolites, urine        Cutoff 300 ng/mL Phencyclidine (PCP), urine         Cutoff 25 ng/mL Cannabinoid, urine                 Cutoff 50 ng/mL Barbiturates + metabolites, urine  Cutoff 200 ng/mL Benzodiazepine, urine               Cutoff 200 ng/mL Methadone, urine                   Cutoff 300 ng/mL The urine drug screen provides only a preliminary, unconfirmed analytical test result and should not be used for non-medical purposes. Clinical consideration and professional judgment should be applied to any positive drug screen result due to possible interfering substances. A more specific alternate chemical method must be used in order to obtain a confirmed analytical result. Gas chromatography / mass spectrometry (GC/MS) is the preferred confirmat                          ory method. Performed at [facility], [address], [place], [place] [address]   . Preg Test, Ur 09/29/2017 NEGATIVE  NEGATIVE Final   Performed at [facility], [address] [place], [place] [address]  . ABO/RH(D) 09/29/2017    Final                   Value:A NEG Performed at [facility], [address] [place], [place] [address]   . Preg Test, Ur 09/29/2017 NEGATIVE  NEGATIVE Final   Comment:        THE SENSITIVITY OF THIS METHODOLOGY IS >24 mIU/mL   . Sodium 09/29/2017 144  135 - 145 mmol/L Final  . Potassium 09/29/2017  3.5  3.5 - 5.1 mmol/L Final  . Glucose, Bld 09/29/2017 94  70 - 99 mg/dL Final  . HCT 09/29/2017 40.0  36.0 - 46.0 % Final  . Hemoglobin 09/29/2017 13.6  12.0 - 15.0 g/dL Final  . SURGICAL PATHOLOGY 09/29/2017    Final                   Value:Surgical Pathology CASE: [id number] PATIENT: Elizabeth Spencer Surgical Pathology Report     SPECIMEN SUBMITTED: A. Uterus with cervix, bilateral tubes  CLINICAL HISTORY: None provided  PRE-OPERATIVE DIAGNOSIS: Pelvic pain, dysmenorrhea  POST-OPERATIVE DIAGNOSIS: Same as pre-op     DIAGNOSIS: A. UTERUS WITH CERVIX; HYSTERECTOMY: - CERVIX WITHOUT PATHOLOGIC CHANGES. - SECRETORY ENDOMETRIUM. - MYOMETRIUM WITHOUT PATHOLOGIC CHANGES.  BILATERAL FALLOPIAN TUBES; SALPINGECTOMY: - NO PATHOLOGIC CHANGES.   GROSS  DESCRIPTION: A. Labeled: Uterus with cervix, bilateral tubes Received: In formalin Weight: 102 grams Dimensions:      Fundus -5.4 x 5.5 x 3.5 cm      Cervix -2.4 x 2.5 cm with a 0.4 cm slit like os Serosa: Wrinkled pink-tan Cervix: Smooth purple Endocervix: Trabecular pink-tan Endometrial cavity:      Dimensions -3.4 x 2.5 cm      Thickness -0.1-0.2 cm      Other findings -none noted Myometrium:     Thickness -2.2 cm     Other findings -n                         one noted Adnexa:       Right fallopian tube           Measurements -5.1 cm in length x 1.1 cm in diameter           Other findings -pink fimbriated       Left fallopian tube            Measurements -4.8 cm in length x 1.1 cm in diameter           Other findings -purple fimbriated, appears discontinuous Other comments: No additional noted  Block summary: 1 - representative anterior cervix 2 - representative posterior cervix 3 - representative anterior endomyometrium 4 - representative posterior endomyometrium 5 - representative cross-section and longitudinal fimbriated right fallopian tube 6 - representative cross-section and longitudinal fimbriated left fallopian tube    Final Diagnosis performed by Mary Olney, MD.   Electronically signed 10/02/2017 6:44:23PM The electronic signature indicates that the named Attending Pathologist has evaluated the specimen  Technical component performed at [facility], [address], [place], [place] [address] Lab: [phone] Dir                         : Sanjai Nagendra, MD, MMM  Professional component performed at [facility], [facility], [address], [place], [place] [address] Lab: [phone] Dir: Tara C. Rubinas, MD   . WBC 09/30/2017 15.3* 3.6 - 11.0 K/uL Final  . RBC 09/30/2017 4.22  3.80 - 5.20 MIL/uL Final  . Hemoglobin 09/30/2017 12.8  12.0 - 16.0 g/dL Final  . HCT 09/30/2017 37.6  35.0 - 47.0 % Final  . MCV 09/30/2017 89.1  80.0 - 100.0 fL  Final  . MCH 09/30/2017 30.2  26.0 - 34.0 pg Final  . MCHC 09/30/2017 33.9  32.0 - 36.0 g/dL Final  . RDW 09/30/2017 13.2  11.5 - 14.5 % Final  . Platelets 09/30/2017 172  150 - 440 K/uL   Final   Performed at [facility], [address] [place], [place] [address]  . Sodium 09/30/2017 140  135 - 145 mmol/L Final  . Potassium 09/30/2017 3.7  3.5 - 5.1 mmol/L Final  . Chloride 09/30/2017 109  98 - 111 mmol/L Final   Please note change in reference range.  . CO2 09/30/2017 21* 22 - 32 mmol/L Final  . Glucose, Bld 09/30/2017 142* 70 - 99 mg/dL Final   Please note change in reference range.  . BUN 09/30/2017 7  6 - 20 mg/dL Final   Please note change in reference range.  . Creatinine, Ser 09/30/2017 0.46  0.44 - 1.00 mg/dL Final  . Calcium 09/30/2017 8.7* 8.9 - 10.3 mg/dL Final  . GFR calc non Af Amer 09/30/2017 >60  >60 mL/min Final  . GFR calc Af Amer 09/30/2017 >60  >60 mL/min Final   Comment: (NOTE) The eGFR has been calculated using the CKD EPI equation. This calculation has not been validated in all clinical situations. eGFR's persistently <60 mL/min signify possible Chronic Kidney Disease.   . Anion gap 09/30/2017 10  5 - 15 Final   Performed at [facility], [address] [place], [place] [address]    Assessment: 32 y.o. s/p TLH, BS, and cystoscopy stable  Plan: Patient has done well after surgery with no apparent complications.  I have discussed the post-operative course to date, and the expected progress moving forward.  The patient understands what complications to be concerned about.  I will see the patient in routine follow up, or sooner if needed.    Activity plan: No heavy lifting, nothing per vagina  Return in about 5 weeks (around 11/14/2017) for postop.     , MD, FACOG [facility], [facility] 10/10/2017, 1:24 PM   

## 2017-11-04 ENCOUNTER — Telehealth: Payer: Self-pay

## 2017-11-04 ENCOUNTER — Encounter: Payer: Self-pay | Admitting: Emergency Medicine

## 2017-11-04 ENCOUNTER — Observation Stay
Admission: EM | Admit: 2017-11-04 | Discharge: 2017-11-06 | Disposition: A | Discharge status: Home / Self Care | Payer: Medicaid Other | Attending: Obstetrics and Gynecology | Admitting: Obstetrics and Gynecology

## 2017-11-04 ENCOUNTER — Other Ambulatory Visit: Payer: Self-pay

## 2017-11-04 DIAGNOSIS — M1990 Unspecified osteoarthritis, unspecified site: Secondary | ICD-10-CM | POA: Insufficient documentation

## 2017-11-04 DIAGNOSIS — J45909 Unspecified asthma, uncomplicated: Secondary | ICD-10-CM | POA: Insufficient documentation

## 2017-11-04 DIAGNOSIS — K219 Gastro-esophageal reflux disease without esophagitis: Secondary | ICD-10-CM | POA: Diagnosis not present

## 2017-11-04 DIAGNOSIS — I739 Peripheral vascular disease, unspecified: Secondary | ICD-10-CM | POA: Diagnosis not present

## 2017-11-04 DIAGNOSIS — Z79899 Other long term (current) drug therapy: Secondary | ICD-10-CM | POA: Insufficient documentation

## 2017-11-04 DIAGNOSIS — F4310 Post-traumatic stress disorder, unspecified: Secondary | ICD-10-CM | POA: Insufficient documentation

## 2017-11-04 DIAGNOSIS — K8020 Calculus of gallbladder without cholecystitis without obstruction: Secondary | ICD-10-CM | POA: Diagnosis not present

## 2017-11-04 DIAGNOSIS — Z881 Allergy status to other antibiotic agents status: Secondary | ICD-10-CM | POA: Diagnosis not present

## 2017-11-04 DIAGNOSIS — N9989 Other postprocedural complications and disorders of genitourinary system: Secondary | ICD-10-CM

## 2017-11-04 DIAGNOSIS — Z90710 Acquired absence of both cervix and uterus: Secondary | ICD-10-CM | POA: Insufficient documentation

## 2017-11-04 DIAGNOSIS — Z888 Allergy status to other drugs, medicaments and biological substances status: Secondary | ICD-10-CM | POA: Insufficient documentation

## 2017-11-04 DIAGNOSIS — Z880 Allergy status to penicillin: Secondary | ICD-10-CM | POA: Diagnosis not present

## 2017-11-04 DIAGNOSIS — Y838 Other surgical procedures as the cause of abnormal reaction of the patient, or of later complication, without mention of misadventure at the time of the procedure: Secondary | ICD-10-CM | POA: Insufficient documentation

## 2017-11-04 DIAGNOSIS — T8131XA Disruption of external operation (surgical) wound, not elsewhere classified, initial encounter: Secondary | ICD-10-CM | POA: Diagnosis present

## 2017-11-04 DIAGNOSIS — T8132XA Disruption of internal operation (surgical) wound, not elsewhere classified, initial encounter: Secondary | ICD-10-CM | POA: Diagnosis not present

## 2017-11-04 DIAGNOSIS — F419 Anxiety disorder, unspecified: Secondary | ICD-10-CM | POA: Insufficient documentation

## 2017-11-04 DIAGNOSIS — Z885 Allergy status to narcotic agent status: Secondary | ICD-10-CM | POA: Diagnosis not present

## 2017-11-04 DIAGNOSIS — F17210 Nicotine dependence, cigarettes, uncomplicated: Secondary | ICD-10-CM | POA: Insufficient documentation

## 2017-11-04 DIAGNOSIS — T81328A Disruption or dehiscence of closure of other specified internal operation (surgical) wound, initial encounter: Secondary | ICD-10-CM | POA: Diagnosis present

## 2017-11-04 LAB — CBC WITH DIFFERENTIAL/PLATELET
BASOS ABS: 0.1 10*3/uL (ref 0–0.1)
Basophils Relative: 1 %
EOS ABS: 0.3 10*3/uL (ref 0–0.7)
EOS PCT: 3 %
HCT: 42.4 % (ref 35.0–47.0)
HEMOGLOBIN: 14.9 g/dL (ref 12.0–16.0)
LYMPHS ABS: 2.9 10*3/uL (ref 1.0–3.6)
LYMPHS PCT: 29 %
MCH: 30.6 pg (ref 26.0–34.0)
MCHC: 35 g/dL (ref 32.0–36.0)
MCV: 87.4 fL (ref 80.0–100.0)
Monocytes Absolute: 0.9 10*3/uL (ref 0.2–0.9)
Monocytes Relative: 9 %
NEUTROS PCT: 58 %
Neutro Abs: 5.8 10*3/uL (ref 1.4–6.5)
PLATELETS: 199 10*3/uL (ref 150–440)
RBC: 4.86 MIL/uL (ref 3.80–5.20)
RDW: 13.5 % (ref 11.5–14.5)
WBC: 9.9 10*3/uL (ref 3.6–11.0)

## 2017-11-04 MED ORDER — HYDROMORPHONE HCL 1 MG/ML IJ SOLN: 1.0000 mg | Freq: Once | INTRAMUSCULAR | Status: DC

## 2017-11-04 MED ORDER — SODIUM CHLORIDE 0.9 % IV BOLUS
1000.0000 mL | Freq: Once | INTRAVENOUS | Status: AC
  Administered 2017-11-04: 1000 mL via INTRAVENOUS

## 2017-11-04 MED ORDER — HALOPERIDOL LACTATE 5 MG/ML IJ SOLN
2.5000 mg | Freq: Once | INTRAMUSCULAR | Status: AC
  Administered 2017-11-04: 2.5 mg via INTRAVENOUS
  Filled 2017-11-04: qty 1

## 2017-11-04 MED ORDER — FENTANYL CITRATE (PF) 100 MCG/2ML IJ SOLN
100.0000 ug | Freq: Once | INTRAMUSCULAR | Status: AC
  Administered 2017-11-04: 100 ug via INTRAVENOUS
  Filled 2017-11-04: qty 2

## 2017-11-04 NOTE — Telephone Encounter (Signed)
Pt has been advised and will go to the ER to evaluated, waiting for her friend to get off work to take her

## 2017-11-04 NOTE — ED Notes (Signed)
FIRST NURSE NOTE:  Pt here related to post-op problem from June 27th.  Pt had total hysterectomy.  Pt called Dr. Ramiro HarvestStaebler's office today,sent here by Dr. Bonney AidStaebler to be evaluated for possible ruptture of vaginal cuff

## 2017-11-04 NOTE — ED Notes (Signed)
Pt refused pelvic exam by EDP, wants to wait for her OBGYN specifically, DR. Stabler. Wishes honored. Pt alert and oriented and in NAD, obtaining VS at current. Primary RN updated.

## 2017-11-04 NOTE — Telephone Encounter (Signed)
Pt calling to speak with Bonney AidStaebler, pt states she is having bleeding and pain after she had intercourse, asked pt is staebler had approved for her to have it she stated "No I just figured it wouldn't hurt" pt requesting to speak with provider and would like to know what to do.

## 2017-11-04 NOTE — ED Provider Notes (Signed)
[facility] Emergency Department Provider Note  ____________________________________________   First MD Initiated Contact with Patient 11/04/17 2259     (approximate)  I have reviewed the triage vital signs and the nursing notes.   HISTORY  Chief Complaint Vaginal Pain    HPI Elizabeth Spencer is a 32 y.o. female who self presents the emergency department with severe lower abdominal pelvic pain that began yesterday.  On June 27 about 4-1/2 weeks ago she had a hysterectomy.  She was instructed not to have sexual intercourse for 6 to 8 weeks however she has had sex twice since.  Her pain has become severe ever since having vaginal intercourse.  She called her gynecologist office who advised her to come to the emergency department.  Her pain is severe pelvic nonradiating.  It is worse with movement improved with rest.  She denies fevers or chills.  She denies nausea or vomiting.    Past Medical History:  Diagnosis Date  . Arthritis   . Asthma    WELL CONTROLLED  . Brain aneurysm 2007   NEUROLOGY NOTE DOES NOT MENTION ANEURYSM BUT PT STATES SHE DID NOT HAVE TO HAVE SURGERY  . Complication of anesthesia    FOR 1 C-SECTION PT WAS ITCHING AND VERY RED ON HER FACE  . Family history of adverse reaction to anesthesia    brother, neice and nephew got red in face with hives  . Gallstones   . GERD (gastroesophageal reflux disease)   . Headache    CHRONIC HEADACHES  . History of chronic cough    DRY  . Pneumonia   . PTSD (post-traumatic stress disorder)   . PTSD (post-traumatic stress disorder)   . Thyroid condition    PT WAS JUST TOLD ON 08-25-17 THAT SHE HAS A THYROID PROBLEM AND IS GOING TO F/U WITH ENDOCRINOLOGIST IN 2 WEEKS    Patient Active Problem List   Diagnosis Date Noted  . Vaginal cuff dehiscence 11/05/2017  . S/P laparoscopic hysterectomy 09/29/2017    Past Surgical History:  Procedure Laterality Date  . ABDOMINAL HYSTERECTOMY    . CESAREAN  SECTION     x3  . CYSTOSCOPY N/A 09/29/2017   Procedure: CYSTOSCOPY;  Surgeon: Vena Austria, MD;  Location: [facility] ORS;  Service: Gynecology;  Laterality: N/A;  . CYSTOSCOPY N/A 11/05/2017   Procedure: CYSTOSCOPY;  Surgeon: Vena Austria, MD;  Location: [facility] ORS;  Service: Gynecology;  Laterality: N/A;  . HYSTEROSCOPY W/D&C N/A 09/01/2017   Procedure: DILATATION AND CURETTAGE /HYSTEROSCOPY;  Surgeon: Vena Austria, MD;  Location: [facility] ORS;  Service: Gynecology;  Laterality: N/A;  . LAPAROSCOPY N/A 09/01/2017   Procedure: LAPAROSCOPY OPERATIVE with biopsy;  Surgeon: Vena Austria, MD;  Location: [facility] ORS;  Service: Gynecology;  Laterality: N/A;  . LAPAROSCOPY N/A 11/05/2017   Procedure: LAPAROSCOPY DIAGNOSTIC;  Surgeon: Vena Austria, MD;  Location: [facility] ORS;  Service: Gynecology;  Laterality: N/A;  . REPAIR VAGINAL CUFF N/A 11/05/2017   Procedure: REPAIR VAGINAL CUFF;  Surgeon: Vena Austria, MD;  Location: [facility] ORS;  Service: Gynecology;  Laterality: N/A;  . TOTAL LAPAROSCOPIC HYSTERECTOMY WITH SALPINGECTOMY Bilateral 09/29/2017   Procedure: TOTAL LAPAROSCOPIC HYSTERECTOMY WITH SALPINGECTOMY;  Surgeon: Vena Austria, MD;  Location: [facility] ORS;  Service: Gynecology;  Laterality: Bilateral;    Prior to Admission medications   Medication Sig Start Date End Date Taking? Authorizing Provider  albuterol (PROVENTIL HFA;VENTOLIN HFA) 108 (90 Base) MCG/ACT inhaler Inhale 2 puffs into the lungs every 6 (six) hours as needed for  wheezing or shortness of breath.  01/03/15   [provider]  cetirizine (ZYRTEC) 10 MG tablet Take 10 mg by mouth at bedtime.  03/11/17 03/11/18  [provider]  EPIPEN 2-PAK 0.3 MG/0.3ML SOAJ injection INJECT INTO THIGH MUSCLE THROUGH CLOTHES AS NEEDED SEVERE ALLERGIC REACTION 01/29/15   [provider]  HYDROmorphone (DILAUDID) 2 MG tablet Take 1 tablet (2 mg total) by mouth every 4 (four) hours as needed for moderate pain or severe  pain. 11/06/17   Vena Austria, MD  ibuprofen (ADVIL,MOTRIN) 600 MG tablet Take 1 tablet (600 mg total) by mouth every 6 (six) hours as needed. 11/06/17   Vena Austria, MD  ketoconazole (NIZORAL) 2 % shampoo SHAMPOO DAILY,AFTER UNDER CONTROL ONCE WEEKLY.APPLY & LATHER-LEAVE ON FOR BEFORE WASHING OFF 06/27/17   [provider]  Multiple Vitamins-Minerals (CENTRUM ADULTS PO) Take 1 tablet by mouth at bedtime.     [provider]  omeprazole (PRILOSEC) 20 MG capsule Take 20 mg by mouth at bedtime.  03/11/17   [provider]  RETIN-A 0.025 % cream APPLY PEA SIZE AMOUNT TO FACE AT NIGHT 06/27/17   [provider]  triamcinolone cream (KENALOG) 0.1 % Apply topically once daily as needed for psoriasis Chesley Noon 11/01/16   [provider]    Allergies Azithromycin; Meloxicam; Neurontin [gabapentin]; Oxycodone; Penicillins; Clindamycin/lincomycin; Other; Diphenhydramine hcl; and Doxycycline  Family History  Problem Relation Age of Onset  . Hypertension Mother   . Alcohol abuse Mother   . Deep vein thrombosis Mother   . Alcohol abuse Father   . COPD Father   . Emphysema Father   . Heart disease Father     Social History Social History   Tobacco Use  . Smoking status: Current Every Day Smoker    Packs/day: 1.00    Years: 18.00    Pack years: 18.00    Types: Cigarettes  . Smokeless tobacco: Never Used  Substance Use Topics  . Alcohol use: Yes    Alcohol/week: 0.0 oz    Comment: daily the last 2 weeks  . Drug use: Yes    Types: Marijuana    Comment: last on 09/25/17    Review of Systems Constitutional: No fever/chills Eyes: No visual changes. ENT: No sore throat. Cardiovascular: Denies chest pain. Respiratory: Denies shortness of breath. Gastrointestinal: Positive for abdominal pain.  No nausea, no vomiting.  No diarrhea.  No constipation. Genitourinary: Negative for dysuria. Musculoskeletal: Negative for back pain. Skin: Negative  for rash. Neurological: Negative for headaches, focal weakness or numbness.   ____________________________________________   PHYSICAL EXAM:  VITAL SIGNS: ED Triage Vitals [11/04/17 1747]  Enc Vitals Group     BP 117/62     Pulse Rate (!) 124     Resp 16     Temp 98.6 F (37 C)     Temp Source Oral     SpO2 98 %     Weight 220 lb (99.8 kg)     Height 5\' 3"  (1.6 m)     Head Circumference      Peak Flow      Pain Score 8     Pain Loc      Pain Edu?      Excl. in GC?     Constitutional: Alert and oriented x4 obviously uncomfortable appearing nontoxic Eyes: PERRL EOMI. Head: Atraumatic. Nose: No congestion/rhinnorhea. Mouth/Throat: No trismus Neck: No stridor.   Cardiovascular: Tachycardic rate, regular rhythm. Grossly normal heart sounds.  Good peripheral  circulation. Respiratory: Normal respiratory effort.  No retractions. Lungs CTAB and moving good air Gastrointestinal: Quite tender suprapubic region with no frank peritonitis Pelvic exam performed with female nurse chaperone: Feculent appearing material in the vault and the vaginal cuff appears to be dehisced Musculoskeletal: No lower extremity edema   Neurologic:  Normal speech and language. No gross focal neurologic deficits are appreciated. Skin:  Skin is warm, dry and intact. No rash noted. Psychiatric: Mood and affect are normal. Speech and behavior are normal.    ____________________________________________   DIFFERENTIAL includes but not limited to  Vaginal cuff dehiscence, postoperative infection, enterotomy ____________________________________________   LABS (all labs ordered are listed, but only abnormal results are displayed)  Labs Reviewed  COMPREHENSIVE METABOLIC PANEL - Abnormal; Notable for the following components:      Result Value   BUN 5 (*)    All other components within normal limits  BASIC METABOLIC PANEL - Abnormal; Notable for the following components:   Chloride 114 (*)    Glucose,  Bld 109 (*)    Calcium 8.6 (*)    Anion gap 3 (*)    All other components within normal limits  CBC WITH DIFFERENTIAL/PLATELET  LACTIC ACID, PLASMA  CBC  TYPE AND SCREEN    Lab work reviewed by me with no acute disease __________________________________________  EKG   ____________________________________________  RADIOLOGY  CT abdomen pelvis reviewed by me with no acute disease ____________________________________________   PROCEDURES  Procedure(s) performed: no  .Critical Care Performed by: Merrily Brittle, MD Authorized by: Merrily Brittle, MD   Critical care provider statement:    Critical care time (minutes):  30   Critical care time was exclusive of:  Separately billable procedures and treating other patients   Critical care was necessary to treat or prevent imminent or life-threatening deterioration of the following conditions: abdominal casastrophe.   Critical care was time spent personally by me on the following activities:  Development of treatment plan with patient or surrogate, discussions with consultants, evaluation of patient's response to treatment, examination of patient, obtaining history from patient or surrogate, ordering and performing treatments and interventions, ordering and review of laboratory studies, ordering and review of radiographic studies, pulse oximetry, re-evaluation of patient's condition and review of old charts    Critical Care performed: Yes  ____________________________________________   INITIAL IMPRESSION / ASSESSMENT AND PLAN / ED COURSE  Pertinent labs & imaging results that were available during my care of the patient were reviewed by me and considered in my medical decision making (see chart for details).   As part of my medical decision making, I reviewed the following data within the electronic MEDICAL RECORD NUMBER History obtained from family if available, nursing notes, old chart and ekg, as well as notes from prior ED  visits.       ----------------------------------------- 11:17 PM on 11/04/2017 -----------------------------------------  I spoke with Dr. Bonney Aid who has asked me to get a CT scan with IV contrast to evaluate for free air.  100 mcg of IV fentanyl for pain control now and pelvic exam is pending. ____________________________________________ ----------------------------------------- 1:56 AM on 11/05/2017 ----------------------------------------- Fortunately the patient's CT scan is reassuring however her pelvic exam is concerning for perforation.  There appears to be feculent material in the vault.  I have contacted Dr. Bonney Aid who will come evaluate the patient himself now.  Dr. Bonney Aid agrees that the patient's vaginal cuff is ruptured and he is taking her to the operating room emergently now.   FINAL  CLINICAL IMPRESSION(S) / ED DIAGNOSES  Final diagnoses:  Postoperative surgical complication involving genitourinary system associated with genitourinary procedure, unspecified complication      NEW MEDICATIONS STARTED DURING THIS VISIT:  Discharge Medication List as of 11/06/2017 11:32 AM    START taking these medications   Details  ibuprofen (ADVIL,MOTRIN) 600 MG tablet Take 1 tablet (600 mg total) by mouth every 6 (six) hours as needed., Starting Sun 11/06/2017, Normal         Note:  This document was prepared using Dragon voice recognition software and may include unintentional dictation errors.     Merrily Brittleifenbark, Dontrail Blackwell, MD 11/08/17 [phone]

## 2017-11-04 NOTE — Telephone Encounter (Signed)
Please instruct her to go to ER to be evaluated as she may have ruptured her vaginal cuff if she had intercourse

## 2017-11-04 NOTE — ED Notes (Signed)
Patient ambulatory to stat desk in no acute distress. Patient asking about wait time. Patient given update on wait time. Patient verbalizes understanding.  

## 2017-11-04 NOTE — ED Triage Notes (Signed)
Pt to ED via POV, pt states that she had a hysterectomy on 09/29/17. Pt states that she has had intercourse twice since surgery, last time was yesterday. Pt states that since having intercourse yesterday she has been having severe pain in her vagina and tailbone. Pt denies any known injury to the area. Pt is in NAD at this time.

## 2017-11-04 NOTE — ED Notes (Signed)
ED Provider at bedside. 

## 2017-11-05 ENCOUNTER — Emergency Department: Payer: Medicaid Other | Admitting: Anesthesiology

## 2017-11-05 ENCOUNTER — Emergency Department: Payer: Medicaid Other

## 2017-11-05 ENCOUNTER — Encounter: Payer: Self-pay | Admitting: Radiology

## 2017-11-05 ENCOUNTER — Encounter
Admission: EM | Disposition: A | Discharge status: Home / Self Care | Payer: Self-pay | Source: Home / Self Care | Attending: Emergency Medicine

## 2017-11-05 DIAGNOSIS — T8131XA Disruption of external operation (surgical) wound, not elsewhere classified, initial encounter: Secondary | ICD-10-CM | POA: Diagnosis present

## 2017-11-05 DIAGNOSIS — T8132XA Disruption of internal operation (surgical) wound, not elsewhere classified, initial encounter: Secondary | ICD-10-CM | POA: Diagnosis not present

## 2017-11-05 HISTORY — PX: CYSTOSCOPY: SHX5120

## 2017-11-05 HISTORY — PX: REPAIR VAGINAL CUFF: SHX6067

## 2017-11-05 HISTORY — PX: LAPAROSCOPY: SHX197

## 2017-11-05 LAB — COMPREHENSIVE METABOLIC PANEL
ALK PHOS: 67 U/L (ref 38–126)
ALT: 15 U/L (ref 0–44)
AST: 16 U/L (ref 15–41)
Albumin: 4.1 g/dL (ref 3.5–5.0)
Anion gap: 8 (ref 5–15)
BUN: 5 mg/dL — AB (ref 6–20)
CALCIUM: 9.5 mg/dL (ref 8.9–10.3)
CO2: 27 mmol/L (ref 22–32)
CREATININE: 0.55 mg/dL (ref 0.44–1.00)
Chloride: 107 mmol/L (ref 98–111)
Glucose, Bld: 91 mg/dL (ref 70–99)
Potassium: 3.7 mmol/L (ref 3.5–5.1)
Sodium: 142 mmol/L (ref 135–145)
Total Bilirubin: 0.6 mg/dL (ref 0.3–1.2)
Total Protein: 7 g/dL (ref 6.5–8.1)

## 2017-11-05 LAB — LACTIC ACID, PLASMA: LACTIC ACID, VENOUS: 1.1 mmol/L (ref 0.5–1.9)

## 2017-11-05 LAB — TYPE AND SCREEN
ABO/RH(D): A NEG
ANTIBODY SCREEN: NEGATIVE

## 2017-11-05 SURGERY — LAPAROSCOPY, DIAGNOSTIC
Anesthesia: General | Site: Vagina | Wound class: Clean Contaminated

## 2017-11-05 MED ORDER — ALBUTEROL SULFATE (2.5 MG/3ML) 0.083% IN NEBU: 3.0000 mL | INHALATION_SOLUTION | Freq: Four times a day (QID) | RESPIRATORY_TRACT | Status: DC | PRN

## 2017-11-05 MED ORDER — LIDOCAINE HCL (CARDIAC) PF 100 MG/5ML IV SOSY
PREFILLED_SYRINGE | INTRAVENOUS | Status: DC | PRN
  Administered 2017-11-05: 100 mg via INTRAVENOUS

## 2017-11-05 MED ORDER — LACTATED RINGERS IV SOLN
INTRAVENOUS | Status: DC | PRN
  Administered 2017-11-05: 04:00:00 via INTRAVENOUS

## 2017-11-05 MED ORDER — GENTAMICIN SULFATE 40 MG/ML IJ SOLN
1.5000 mg/kg | Freq: Three times a day (TID) | INTRAVENOUS | Status: AC
  Administered 2017-11-05 (×2): 110 mg via INTRAVENOUS
  Filled 2017-11-05 (×5): qty 2.75

## 2017-11-05 MED ORDER — HYDROMORPHONE HCL 2 MG PO TABS
2.0000 mg | ORAL_TABLET | ORAL | Status: DC | PRN
  Administered 2017-11-05: 2 mg via ORAL
  Administered 2017-11-05 – 2017-11-06 (×3): 4 mg via ORAL
  Filled 2017-11-05: qty 2
  Filled 2017-11-05: qty 1
  Filled 2017-11-05 (×2): qty 2

## 2017-11-05 MED ORDER — IOHEXOL 300 MG/ML  SOLN
100.0000 mL | Freq: Once | INTRAMUSCULAR | Status: AC | PRN
  Administered 2017-11-05: 100 mL via INTRAVENOUS

## 2017-11-05 MED ORDER — BUPIVACAINE HCL (PF) 0.5 % IJ SOLN
INTRAMUSCULAR | Status: AC
  Filled 2017-11-05: qty 30

## 2017-11-05 MED ORDER — DEXTROSE-NACL 5-0.45 % IV SOLN
INTRAVENOUS | Status: DC
  Administered 2017-11-05 – 2017-11-06 (×3): via INTRAVENOUS

## 2017-11-05 MED ORDER — MIDAZOLAM HCL 2 MG/2ML IJ SOLN
INTRAMUSCULAR | Status: DC | PRN
  Administered 2017-11-05: 2 mg via INTRAVENOUS

## 2017-11-05 MED ORDER — DEXAMETHASONE SODIUM PHOSPHATE 10 MG/ML IJ SOLN
INTRAMUSCULAR | Status: DC | PRN
  Administered 2017-11-05: 10 mg via INTRAVENOUS

## 2017-11-05 MED ORDER — METRONIDAZOLE IN NACL 5-0.79 MG/ML-% IV SOLN
500.0000 mg | INTRAVENOUS | Status: AC
Start: 2017-11-05 — End: 2017-11-05
  Administered 2017-11-05: 500 mg via INTRAVENOUS
  Filled 2017-11-05: qty 100

## 2017-11-05 MED ORDER — ONDANSETRON HCL 4 MG/2ML IJ SOLN
INTRAMUSCULAR | Status: DC | PRN
  Administered 2017-11-05: 4 mg via INTRAVENOUS

## 2017-11-05 MED ORDER — MORPHINE SULFATE (PF) 2 MG/ML IV SOLN: 1.0000 mg | INTRAVENOUS | Status: DC | PRN

## 2017-11-05 MED ORDER — PROPOFOL 10 MG/ML IV BOLUS
INTRAVENOUS | Status: AC
  Filled 2017-11-05: qty 20

## 2017-11-05 MED ORDER — KETOROLAC TROMETHAMINE 30 MG/ML IJ SOLN
30.0000 mg | Freq: Four times a day (QID) | INTRAMUSCULAR | Status: DC
  Administered 2017-11-05 – 2017-11-06 (×4): 30 mg via INTRAVENOUS
  Filled 2017-11-05 (×4): qty 1

## 2017-11-05 MED ORDER — ONDANSETRON HCL 4 MG/2ML IJ SOLN: 4.0000 mg | Freq: Four times a day (QID) | INTRAMUSCULAR | Status: DC | PRN

## 2017-11-05 MED ORDER — PROPOFOL 10 MG/ML IV BOLUS
INTRAVENOUS | Status: DC | PRN
  Administered 2017-11-05: 150 mg via INTRAVENOUS

## 2017-11-05 MED ORDER — GENTAMICIN SULFATE 40 MG/ML IJ SOLN
5.0000 mg/kg | INTRAMUSCULAR | Status: AC
  Administered 2017-11-05: 360 mg via INTRAVENOUS
  Filled 2017-11-05: qty 9

## 2017-11-05 MED ORDER — PANTOPRAZOLE SODIUM 40 MG PO TBEC
40.0000 mg | DELAYED_RELEASE_TABLET | Freq: Every day | ORAL | Status: DC
  Administered 2017-11-05 – 2017-11-06 (×2): 40 mg via ORAL
  Filled 2017-11-05 (×2): qty 1

## 2017-11-05 MED ORDER — SUCCINYLCHOLINE CHLORIDE 20 MG/ML IJ SOLN
INTRAMUSCULAR | Status: DC | PRN
  Administered 2017-11-05: 100 mg via INTRAVENOUS

## 2017-11-05 MED ORDER — FENTANYL CITRATE (PF) 100 MCG/2ML IJ SOLN
INTRAMUSCULAR | Status: AC
  Administered 2017-11-05: 25 ug via INTRAVENOUS
  Filled 2017-11-05: qty 2

## 2017-11-05 MED ORDER — FENTANYL CITRATE (PF) 100 MCG/2ML IJ SOLN
INTRAMUSCULAR | Status: DC | PRN
  Administered 2017-11-05 (×2): 50 ug via INTRAVENOUS

## 2017-11-05 MED ORDER — KETOROLAC TROMETHAMINE 30 MG/ML IJ SOLN: 30.0000 mg | Freq: Four times a day (QID) | INTRAMUSCULAR | Status: DC

## 2017-11-05 MED ORDER — FENTANYL CITRATE (PF) 100 MCG/2ML IJ SOLN
INTRAMUSCULAR | Status: AC
  Filled 2017-11-05: qty 2

## 2017-11-05 MED ORDER — MIDAZOLAM HCL 2 MG/2ML IJ SOLN
INTRAMUSCULAR | Status: AC
  Filled 2017-11-05: qty 2

## 2017-11-05 MED ORDER — FENTANYL CITRATE (PF) 100 MCG/2ML IJ SOLN
25.0000 ug | INTRAMUSCULAR | Status: DC | PRN
  Administered 2017-11-05 (×4): 25 ug via INTRAVENOUS

## 2017-11-05 MED ORDER — LORATADINE 10 MG PO TABS
10.0000 mg | ORAL_TABLET | Freq: Every day | ORAL | Status: DC
  Administered 2017-11-05 – 2017-11-06 (×2): 10 mg via ORAL
  Filled 2017-11-05 (×2): qty 1

## 2017-11-05 MED ORDER — SUGAMMADEX SODIUM 200 MG/2ML IV SOLN
INTRAVENOUS | Status: DC | PRN
  Administered 2017-11-05: 200 mg via INTRAVENOUS

## 2017-11-05 MED ORDER — MENTHOL 3 MG MT LOZG
1.0000 | LOZENGE | OROMUCOSAL | Status: DC | PRN
  Filled 2017-11-05: qty 9

## 2017-11-05 MED ORDER — BUPIVACAINE HCL 0.5 % IJ SOLN
INTRAMUSCULAR | Status: DC | PRN
  Administered 2017-11-05: 6 mL

## 2017-11-05 MED ORDER — SILVER NITRATE-POT NITRATE 75-25 % EX MISC
CUTANEOUS | Status: AC
  Filled 2017-11-05: qty 1

## 2017-11-05 MED ORDER — METRONIDAZOLE IN NACL 5-0.79 MG/ML-% IV SOLN
500.0000 mg | Freq: Two times a day (BID) | INTRAVENOUS | Status: DC
  Administered 2017-11-05 – 2017-11-06 (×2): 500 mg via INTRAVENOUS
  Filled 2017-11-05 (×4): qty 100

## 2017-11-05 MED ORDER — ROCURONIUM BROMIDE 100 MG/10ML IV SOLN
INTRAVENOUS | Status: DC | PRN
  Administered 2017-11-05: 30 mg via INTRAVENOUS
  Administered 2017-11-05: 20 mg via INTRAVENOUS

## 2017-11-05 MED ORDER — ONDANSETRON HCL 4 MG/2ML IJ SOLN: 4.0000 mg | Freq: Once | INTRAMUSCULAR | Status: DC | PRN

## 2017-11-05 SURGICAL SUPPLY — 34 items
BLADE SURG SZ11 CARB STEEL (BLADE) ×5 IMPLANT
CANISTER SUCT 1200ML W/VALVE (MISCELLANEOUS) ×5 IMPLANT
CATH ROBINSON RED A/P 16FR (CATHETERS) ×5 IMPLANT
CHLORAPREP W/TINT 26ML (MISCELLANEOUS) ×5 IMPLANT
DERMABOND ADVANCED (GAUZE/BANDAGES/DRESSINGS) ×2
DERMABOND ADVANCED .7 DNX12 (GAUZE/BANDAGES/DRESSINGS) ×3 IMPLANT
DRSG TEGADERM 2-3/8X2-3/4 SM (GAUZE/BANDAGES/DRESSINGS) ×5 IMPLANT
GLOVE BIO SURGEON STRL SZ7 (GLOVE) ×25 IMPLANT
GLOVE INDICATOR 7.5 STRL GRN (GLOVE) ×10 IMPLANT
GOWN STRL REUS W/ TWL LRG LVL3 (GOWN DISPOSABLE) ×6 IMPLANT
GOWN STRL REUS W/TWL LRG LVL3 (GOWN DISPOSABLE) ×4
IRRIGATION STRYKERFLOW (MISCELLANEOUS) IMPLANT
IRRIGATOR STRYKERFLOW (MISCELLANEOUS)
IV LACTATED RINGERS 1000ML (IV SOLUTION) IMPLANT
KIT PINK PAD W/HEAD ARE REST (MISCELLANEOUS) ×5
KIT PINK PAD W/HEAD ARM REST (MISCELLANEOUS) ×3 IMPLANT
KIT TURNOVER CYSTO (KITS) ×5 IMPLANT
LABEL OR SOLS (LABEL) IMPLANT
NS IRRIG 500ML POUR BTL (IV SOLUTION) ×5 IMPLANT
PACK GYN LAPAROSCOPIC (MISCELLANEOUS) ×5 IMPLANT
PAD OB MATERNITY 4.3X12.25 (PERSONAL CARE ITEMS) ×5 IMPLANT
PAD PREP 24X41 OB/GYN DISP (PERSONAL CARE ITEMS) ×5 IMPLANT
SCISSORS METZENBAUM CVD 33 (INSTRUMENTS) IMPLANT
SHEARS HARMONIC ACE PLUS 36CM (ENDOMECHANICALS) IMPLANT
SLEEVE ENDOPATH XCEL 5M (ENDOMECHANICALS) ×5 IMPLANT
SPONGE GAUZE 2X2 8PLY STER LF (GAUZE/BANDAGES/DRESSINGS) ×1
SPONGE GAUZE 2X2 8PLY STRL LF (GAUZE/BANDAGES/DRESSINGS) ×4 IMPLANT
SUT MAXON ABS #0 GS21 30IN (SUTURE) ×10 IMPLANT
SUT MNCRL AB 4-0 PS2 18 (SUTURE) ×5 IMPLANT
SUT VIC AB 0 CT2 27 (SUTURE) ×5 IMPLANT
SYR 10ML LL (SYRINGE) ×5 IMPLANT
TROCAR ENDO BLADELESS 11MM (ENDOMECHANICALS) IMPLANT
TROCAR XCEL NON-BLD 5MMX100MML (ENDOMECHANICALS) ×5 IMPLANT
TUBING INSUFFLATION (TUBING) ×5 IMPLANT

## 2017-11-05 NOTE — OR Nursing (Signed)
Dr. Maisie Fushomas contacted to see if toxicology screen desired since hx of marijuana abuse. MD acknowledged and states that he doesn't want d/t emergent case.

## 2017-11-05 NOTE — Op Note (Addendum)
Preoperative Diagnosis: 1) 32 y.o. with vaginal cuff dehiscence  Postoperative Diagnosis: 1) 32 y.o. with vaginal cuff dehiscence  Operation Performed: Diagnostic laparoscopy, vaginal cuff closure, cystoscopy  Indication: Vaginal cuff dehiscence following coitus within the initial 6 week postoperative period  Surgeon: Vena AustriaAndreas Semiah Konczal, MD  Anesthesia: General  Preoperative Antibiotics: gentamycin and flagyl  Estimated Blood Loss: 30mL  IV Fluids: 500mL  Urine Output:: 100mL  Drains or Tubes: none  Implants: none  Specimens Removed: none  Complications: none  Intraoperative Findings: Vaginal cuff with almost complete separation of the mucosa.  Prior sutures evident.  Probing of the cuff revealed the defect not to extend into the peritoneal cavity.  Laparoscopy showed two small sigmoid epiploica adhered to the vaginal cuff but now bowl.  Normal ovaries.  Cystoscopy revealed no sutures within the bladder, efflux from bilateral ureteral orifices.    Patient Condition: stable  Procedure in Detail:  Patient was taken to the operating room where she was administered general anesthesia.  She was positioned in the dorsal lithotomy position utilizing Allen stirups, prepped and draped in the usual sterile fashion.  Prior to proceeding with procedure a time out was performed.  Attention was turned to the patient's pelvis.  An indwelling foley catheter was placed to decompress the patient's bladder.  An operative speculum was placed to allow visualization of the cervix.  The anterior lip of cuff was grasped with a long Alice and the cuff was probed noting no defects extending into the peritoneal cavity.  The cuff edges appeared healthy and viable with bleeding noted on contact. Attention was turned to the patient's abdomen.  The umbilicus was infiltrated with 1% Sensorcaine, before making a stab incision using an 11 blade scalpel.  A 5mm Excel trocar was then used to gain direct entry into the  peritoneal cavity utilizing the camera to visualize progress of the trocar during placement. The patient was placed in steep Trendelenburg.  The cuff was adequately visualized, free of colonic or bowl adhesions with the exception of two small epiploica.Pneumoperitoneum was maintained and attention was turned to the patient pelvis.  The vaginal cuff edges were tagged with long Alice clamps.  The cuff was closed using 0 Maxon using interrupted figure of eight stitches.  The cuff was hemsotatic at the conclusion of closure without visible or palpable defects.  The laparoscope was then used to visualize the cuff abdominally with no sutures visible and no bowl incorporated into the closure. Pneumoperitoneum was evacuated and trocar was removed.  The trocar site was then dressed with surgical skin glue.The foley catheter was removed.  Cystoscopy was performed noting and intact bladder dome as well as brisk efflux of urine from bother ureteral orifices. Sponge needle and instrument counts were correct time two.  The patient tolerated the procedure well and was taken to the recovery room in stable condition.

## 2017-11-05 NOTE — Anesthesia Procedure Notes (Signed)
Procedure Name: Intubation Date/Time: 11/05/2017 4:01 AM Performed by: Aline Brochure, CRNA Pre-anesthesia Checklist: Patient identified, Emergency Drugs available, Suction available and Patient being monitored Patient Re-evaluated:Patient Re-evaluated prior to induction Oxygen Delivery Method: Circle system utilized Preoxygenation: Pre-oxygenation with 100% oxygen Induction Type: IV induction Ventilation: Mask ventilation without difficulty Laryngoscope Size: Mac and 3 Grade View: Grade I Tube type: Oral Tube size: 7.0 mm Number of attempts: 1 Airway Equipment and Method: Stylet Placement Confirmation: ETT inserted through vocal cords under direct vision,  positive ETCO2 and breath sounds checked- equal and bilateral Secured at: 21 cm Tube secured with: Tape Dental Injury: Teeth and Oropharynx as per pre-operative assessment

## 2017-11-05 NOTE — Anesthesia Postprocedure Evaluation (Signed)
Anesthesia Post Note  Patient: Thayer DallasSusan Inabinet  Procedure(s) Performed: LAPAROSCOPY DIAGNOSTIC (N/A Abdomen) REPAIR VAGINAL CUFF (N/A Vagina ) CYSTOSCOPY (N/A Urethra)  Patient location during evaluation: PACU Anesthesia Type: General Level of consciousness: awake and alert Pain management: pain level controlled Vital Signs Assessment: post-procedure vital signs reviewed and stable Respiratory status: spontaneous breathing, nonlabored ventilation, respiratory function stable and patient connected to nasal cannula oxygen Cardiovascular status: blood pressure returned to baseline and stable Postop Assessment: no apparent nausea or vomiting Anesthetic complications: no     Last Vitals:  Vitals:   11/05/17 0633 11/05/17 0650  BP: 115/76 113/71  Pulse: 84 91  Resp: 20 20  Temp: 37.2 C 36.8 C  SpO2: 95% 98%    Last Pain:  Vitals:   11/05/17 0650  TempSrc: Oral  PainSc:                  Amilyah Nack S

## 2017-11-05 NOTE — Plan of Care (Signed)

## 2017-11-05 NOTE — ED Notes (Signed)
meds rx'd from pharmacy, brandy in OR called for pt being being transported by Maralyn Sagosarah, NT att

## 2017-11-05 NOTE — Progress Notes (Signed)
Patient very agitated about IV in AC. [facility] RN offered arm board to patient. IV site WDL. Two RNs looked for places to put new IV. Patient states she "is a hard stick and I have terrible veins." RN attempted PIV with no success. RN called [facility] to see if an RN was available to try, but all were busy. Order for IV team placed. 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 AC to call IV team to patient's room.

## 2017-11-05 NOTE — Anesthesia Preprocedure Evaluation (Signed)
Anesthesia Evaluation  Patient identified by MRN, date of birth, ID band Patient awake    Reviewed: Allergy & Precautions, NPO status , Patient's Chart, lab work & pertinent test results, reviewed documented beta blocker date and time   History of Anesthesia Complications (+) history of anesthetic complications  Airway Mallampati: III  TM Distance: >3 FB     Dental  (+) Chipped   Pulmonary asthma , pneumonia, resolved, Current Smoker,           Cardiovascular + Peripheral Vascular Disease       Neuro/Psych  Headaches, PSYCHIATRIC DISORDERS Anxiety    GI/Hepatic GERD  Controlled,  Endo/Other    Renal/GU      Musculoskeletal  (+) Arthritis ,   Abdominal   Peds  Hematology   Anesthesia Other Findings Obese. EKG ok.  Reproductive/Obstetrics                             Anesthesia Physical Anesthesia Plan  ASA: III  Anesthesia Plan: General   Post-op Pain Management:    Induction: Intravenous  PONV Risk Score and Plan:   Airway Management Planned: Oral ETT  Additional Equipment:   Intra-op Plan:   Post-operative Plan:   Informed Consent: I have reviewed the patients History and Physical, chart, labs and discussed the procedure including the risks, benefits and alternatives for the proposed anesthesia with the patient or authorized representative who has indicated his/her understanding and acceptance.     Plan Discussed with: CRNA  Anesthesia Plan Comments:         Anesthesia Quick Evaluation

## 2017-11-05 NOTE — H&P (Signed)
Obstetrics & Gynecology Consult H&P   Consulting Department: Emergency Department  Consulting Physician: Darel Hong MD  Consulting Question: Vaginal cuff abnormal   History of Present Illness: Patient is a 32 y.o. [id number] s/p uncomplicated laparoscopic hysterectomy with vaginal closure on 09/29/2017 (5 weeks postoperatively), who decided to have vaginal intercourse despite strict instruction to abstain from doing so in the postoperative period.  Following coitus patient noted vaginal bleeding and onset of abdominal pain prompting her presentation to the ER.  No fevers, no chills.  CT imaging revealed a normal appearing cuff, stable laboratory work up with normal WBC.  However, exam by Dr. Mable Paris showed the cuff to be abnormal in appearance with foul smelling discharge.    Review of Systems:10 point review of systems  Past Medical History:  Past Medical History:  Diagnosis Date  . Arthritis   . Asthma    WELL CONTROLLED  . Brain aneurysm 2007   NEUROLOGY NOTE DOES NOT MENTION ANEURYSM BUT PT STATES SHE DID NOT HAVE TO HAVE SURGERY  . Complication of anesthesia    FOR 1 C-SECTION PT WAS ITCHING AND VERY RED ON HER FACE  . Family history of adverse reaction to anesthesia    brother, neice and nephew got red in face with hives  . Gallstones   . GERD (gastroesophageal reflux disease)   . Headache    CHRONIC HEADACHES  . History of chronic cough    DRY  . Pneumonia   . PTSD (post-traumatic stress disorder)   . PTSD (post-traumatic stress disorder)   . Thyroid condition    PT WAS JUST TOLD ON 08-25-17 THAT SHE HAS A THYROID PROBLEM AND IS GOING TO F/U WITH ENDOCRINOLOGIST IN 2 WEEKS    Past Surgical History:  Past Surgical History:  Procedure Laterality Date  . ABDOMINAL HYSTERECTOMY    . CESAREAN SECTION     x3  . CYSTOSCOPY N/A 09/29/2017   Procedure: CYSTOSCOPY;  Surgeon: Malachy Mood, MD;  Location: [facility] ORS;  Service: Gynecology;  Laterality: N/A;  .  HYSTEROSCOPY W/D&C N/A 09/01/2017   Procedure: DILATATION AND CURETTAGE /HYSTEROSCOPY;  Surgeon: Malachy Mood, MD;  Location: [facility] ORS;  Service: Gynecology;  Laterality: N/A;  . LAPAROSCOPY N/A 09/01/2017   Procedure: LAPAROSCOPY OPERATIVE with biopsy;  Surgeon: Malachy Mood, MD;  Location: [facility] ORS;  Service: Gynecology;  Laterality: N/A;  . TOTAL LAPAROSCOPIC HYSTERECTOMY WITH SALPINGECTOMY Bilateral 09/29/2017   Procedure: TOTAL LAPAROSCOPIC HYSTERECTOMY WITH SALPINGECTOMY;  Surgeon: Malachy Mood, MD;  Location: [facility] ORS;  Service: Gynecology;  Laterality: Bilateral;    Gynecologic History:   Obstetric History: [id number]  Family History:  Family History  Problem Relation Age of Onset  . Hypertension Mother   . Alcohol abuse Mother   . Deep vein thrombosis Mother   . Alcohol abuse Father   . COPD Father   . Emphysema Father   . Heart disease Father     Social History:  Social History   Socioeconomic History  . Marital status: Single    Spouse name: Not on file  . Number of children: Not on file  . Years of education: Not on file  . Highest education level: Not on file  Occupational History  . Not on file  Social Needs  . Financial resource strain: Not on file  . Food insecurity:    Worry: Not on file    Inability: Not on file  . Transportation needs:    Medical: Not on file    Non-medical:  Not on file  Tobacco Use  . Smoking status: Current Every Day Smoker    Packs/day: 1.00    Years: 18.00    Pack years: 18.00    Types: Cigarettes  . Smokeless tobacco: Never Used  Substance and Sexual Activity  . Alcohol use: Yes    Alcohol/week: 0.0 oz    Comment: daily the last 2 weeks  . Drug use: Yes    Types: Marijuana    Comment: last on 09/25/17  . Sexual activity: Yes    Partners: Male    Birth control/protection: Pill  Lifestyle  . Physical activity:    Days per week: Not on file    Minutes per session: Not on file  . Stress: Not on file    Relationships  . Social connections:    Talks on phone: Not on file    Gets together: Not on file    Attends religious service: Not on file    Active member of club or organization: Not on file    Attends meetings of clubs or organizations: Not on file    Relationship status: Not on file  . Intimate partner violence:    Fear of current or ex partner: Not on file    Emotionally abused: Not on file    Physically abused: Not on file    Forced sexual activity: Not on file  Other Topics Concern  . Not on file  Social History Narrative  . Not on file    Allergies:  Allergies  Allergen Reactions  . Azithromycin Swelling and Other (See Comments)  . Meloxicam Other (See Comments)    Burns up from the inside   . Neurontin [Gabapentin] Other (See Comments)    Makes her pass out and not remember what happened prior to taking med.  . Oxycodone Nausea And Vomiting    dizziness  . Penicillins Anaphylaxis    Patient allergic to all "cillins" Has patient had a PCN reaction causing immediate rash, facial/tongue/throat swelling, SOB or lightheadedness with hypotension: Yes Has patient had a PCN reaction causing severe rash involving mucus membranes or skin necrosis: Yes Has patient had a PCN reaction that required hospitalization: No Has patient had a PCN reaction occurring within the last 10 years: Yes If all of the above answers are "NO", then may proceed with Cephalosporin use.   . Clindamycin/Lincomycin Swelling  . Other Hives and Other (See Comments)    Berry flavored food and drinks  . Diphenhydramine Hcl Rash  . Doxycycline Rash    Medications: Prior to Admission medications   Medication Sig Start Date End Date Taking? Authorizing Provider  albuterol (PROVENTIL HFA;VENTOLIN HFA) 108 (90 Base) MCG/ACT inhaler Inhale 2 puffs into the lungs every 6 (six) hours as needed for wheezing or shortness of breath.  01/03/15   [provider]  cetirizine (ZYRTEC) 10 MG tablet Take  10 mg by mouth at bedtime.  03/11/17 03/11/18  [provider]  EPIPEN 2-PAK 0.3 MG/0.3ML SOAJ injection INJECT INTO THIGH MUSCLE THROUGH CLOTHES AS NEEDED SEVERE ALLERGIC REACTION 01/29/15   [provider]  HYDROmorphone (DILAUDID) 2 MG tablet Take 1-2 tablets (2-4 mg total) by mouth every 4 (four) hours as needed for moderate pain or severe pain. 09/30/17   Malachy Mood, MD  ketoconazole (NIZORAL) 2 % shampoo SHAMPOO DAILY,AFTER UNDER CONTROL ONCE WEEKLY.APPLY & LATHER-LEAVE ON FOR 5MIN BEFORE WASHING OFF 06/27/17   [provider]  Multiple Vitamins-Minerals (CENTRUM ADULTS PO) Take 1 tablet by mouth  at bedtime.     [provider]  omeprazole (PRILOSEC) 20 MG capsule Take 20 mg by mouth at bedtime.  03/11/17   [provider]  RETIN-A 0.025 % cream APPLY PEA SIZE AMOUNT TO FACE AT NIGHT 06/27/17   [provider]  triamcinolone cream (KENALOG) 0.1 % Apply topically once daily as needed for psoriasis Damien Fusi 11/01/16   [provider]    Physical Exam Vitals: Blood pressure 128/78, pulse 92, temperature 98.6 F (37 C), temperature source Oral, resp. rate 18, height _0  (1.6 m), weight 220 lb (99.8 kg), last menstrual period 08/22/2017, SpO2 99 %.   General: NAD HEENT: normocephalic, anicteric Pulmonary: No increased work of breathing Cardiovascular: RRR, distal pulses 2+ Abdomen:soft, mildly tender, non-dstended Genitourinary: Normal external female genitalia, normal urethral meatus.  The top of the vaginal cuff appears to be separated, there is foul smelling vaginal discharge and clear separation of the mucosal edges.   Extremities: no edema, erythema, or tenderness Neurologic: Grossly intact Psychiatric: mood appropriate, affect full  Labs: Results for orders placed or performed during the hospital encounter of 11/04/17 (from the past 72 hour(s))  Comprehensive metabolic panel     Status: Abnormal   Collection Time:  11/04/17 11:38 PM  Result Value Ref Range   Sodium 142 135 - 145 mmol/L   Potassium 3.7 3.5 - 5.1 mmol/L   Chloride 107 98 - 111 mmol/L   CO2 27 22 - 32 mmol/L   Glucose, Bld 91 70 - 99 mg/dL   BUN 5 (L) 6 - 20 mg/dL   Creatinine, Ser 0.55 0.44 - 1.00 mg/dL   Calcium 9.5 8.9 - 10.3 mg/dL   Total Protein 7.0 6.5 - 8.1 g/dL   Albumin 4.1 3.5 - 5.0 g/dL   AST 16 15 - 41 U/L   ALT 15 0 - 44 U/L   Alkaline Phosphatase 67 38 - 126 U/L   Total Bilirubin 0.6 0.3 - 1.2 mg/dL   GFR calc non Af Amer >60 >60 mL/min   GFR calc Af Amer >60 >60 mL/min    Comment: (NOTE) The eGFR has been calculated using the CKD EPI equation. This calculation has not been validated in all clinical situations. eGFR's persistently <60 mL/min signify possible Chronic Kidney Disease.    Anion gap 8 5 - 15    Comment: Performed at [facility], [address] [place], [place] [address]  CBC with Differential     Status: None   Collection Time: 11/04/17 11:38 PM  Result Value Ref Range   WBC 9.9 3.6 - 11.0 K/uL   RBC 4.86 3.80 - 5.20 MIL/uL   Hemoglobin 14.9 12.0 - 16.0 g/dL   HCT 42.4 35.0 - 47.0 %   MCV 87.4 80.0 - 100.0 fL   MCH 30.6 26.0 - 34.0 pg   MCHC 35.0 32.0 - 36.0 g/dL   RDW 13.5 11.5 - 14.5 %   Platelets 199 150 - 440 K/uL   Neutrophils Relative % 58 %   Neutro Abs 5.8 1.4 - 6.5 K/uL   Lymphocytes Relative 29 %   Lymphs Abs 2.9 1.0 - 3.6 K/uL   Monocytes Relative 9 %   Monocytes Absolute 0.9 0.2 - 0.9 K/uL   Eosinophils Relative 3 %   Eosinophils Absolute 0.3 0 - 0.7 K/uL   Basophils Relative 1 %   Basophils Absolute 0.1 0 - 0.1 K/uL    Comment: Performed at [facility], [address] [place],  [place] [address]  Lactic acid, plasma     Status: None   Collection Time: 11/04/17 11:38 PM  Result Value Ref Range   Lactic Acid, Venous 1.1 0.5 - 1.9 mmol/L    Comment: Performed at [facility], [address] [place], [place] [address]  Type and screen  [place]     Status: None   Collection Time: 11/04/17 11:38 PM  Result Value Ref Range   ABO/RH(D) A NEG    Antibody Screen NEG    Sample Expiration      11/07/2017 Performed at [facility], [address], [place], [place] [address]     Imaging Ct Abdomen Pelvis W Contrast  Result Date: 11/05/2017 CLINICAL DATA:  Lower abdominal and pelvic pain. Assess for ruptured vaginal cuff, status post hysterectomy September 29, 2017. History of gallstones. EXAM: CT ABDOMEN AND PELVIS WITH CONTRAST TECHNIQUE: Multidetector CT imaging of the abdomen and pelvis was performed using the standard protocol following bolus administration of intravenous contrast. CONTRAST:  167m OMNIPAQUE IOHEXOL 300 MG/ML  SOLN COMPARISON:  Acute abdominal series May 01, 2015 FINDINGS: LOWER CHEST: Dependent atelectasis. Included heart size is normal. No pericardial effusion. HEPATOBILIARY: Multiple peripherally calcified gallstones measuring to 2.8 cm without CT findings of acute cholecystitis. Subcentimeter probable cyst RIGHT lobe of the liver. Focal fatty infiltration about the falciform ligament. PANCREAS: Normal. SPLEEN: Normal. ADRENALS/URINARY TRACT: Kidneys are orthotopic, demonstrating symmetric enhancement. No nephrolithiasis, hydronephrosis or solid renal masses. The unopacified ureters are normal in course and caliber. Delayed imaging through the kidneys demonstrates symmetric prompt contrast excretion within the proximal urinary collecting system. Urinary bladder is partially distended and unremarkable. Normal adrenal glands. STOMACH/BOWEL: The stomach, small and large bowel are normal in course and caliber without inflammatory changes. Mild colonic diverticulosis. Normal appendix. VASCULAR/LYMPHATIC: Aortoiliac vessels are normal in course and caliber. No lymphadenopathy by CT size criteria. REPRODUCTIVE: Status post hysterectomy. Normal appearance of the vaginal cuff. OTHER: No  intraperitoneal free fluid or free air. Trace fat stranding about the vaginal cuff. MUSCULOSKELETAL: Nonacute. Small fat containing umbilical hernia. Mild thoracic spondylosis. IMPRESSION: 1. Status post hysterectomy without complication or acute intra-abdominal/pelvic process. 2. Cholelithiasis. Electronically Signed   By: CElon AlasM.D.   On: 11/05/2017 01:09    Assessment: 32y.o. [id number] vaginal cuff dehiscence post hysterectomy  Plan: 1) Vaginal cuff dehiscence - gentamycin/flagyl IV anticipate to continue for at least 24-hrs postoperatively to decrease risk of vaginal cuff abscess formation following closure - SCD - Dorsal lithotomy position closure with delayed absorbable 1-0 or 0 maxon - NPO since 0600 11/04/17 per patient    AMalachy Mood MD, FWhispering Pines CAnne [facility]undelGroup 11/05/2017, 2:38 AM

## 2017-11-05 NOTE — Anesthesia Post-op Follow-up Note (Signed)
Anesthesia QCDR form completed.        

## 2017-11-05 NOTE — Transfer of Care (Signed)
Immediate Anesthesia Transfer of Care Note  Patient: Elizabeth DallasSusan Volner  Procedure(s) Performed: LAPAROSCOPY DIAGNOSTIC (N/A Abdomen) REPAIR VAGINAL CUFF (N/A Vagina ) CYSTOSCOPY (N/A Urethra)  Patient Location: PACU  Anesthesia Type:General  Level of Consciousness: awake  Airway & Oxygen Therapy: Patient connected to face mask oxygen  Post-op Assessment: Post -op Vital signs reviewed and stable  Post vital signs: stable  Last Vitals:  Vitals Value Taken Time  BP 133/79 11/05/2017  5:36 AM  Temp 36.8 C 11/05/2017  5:36 AM  Pulse 111 11/05/2017  5:37 AM  Resp 36 11/05/2017  5:37 AM  SpO2 99 % 11/05/2017  5:37 AM  Vitals shown include unvalidated device data.  Last Pain:  Vitals:   11/05/17 0536  TempSrc: Temporal  PainSc:          Complications: No apparent anesthesia complications

## 2017-11-05 NOTE — Progress Notes (Signed)
Pharmacy Antibiotic Note  Elizabeth Spencer is a 32 y.o. female admitted on 11/04/2017 with vaginal cuff dehiscence.  Pharmacy has been consulted for gentamicin dosing.  Plan: Will start gentamicin 110 mg IV q8h for 24 hours (1.5 mg/kg of adjusted body weight: 71 kg)  Will monitor renal function w/ am labs. No need for drug monitoring as the regimen is only for 24 hours and is a fairly low dose.  Height: 5\' 3"  (160 cm) Weight: 220 lb (99.8 kg) IBW/kg (Calculated) : 52.4  Temp (24hrs), Avg:98.4 F (36.9 C), Min:98.2 F (36.8 C), Max:98.6 F (37 C)  Recent Labs  Lab 11/04/17 2338  WBC 9.9  CREATININE 0.55  LATICACIDVEN 1.1    Estimated Creatinine Clearance: 111.7 mL/min (by C-G formula based on SCr of 0.55 mg/dL).    Allergies  Allergen Reactions  . Azithromycin Swelling and Other (See Comments)  . Meloxicam Other (See Comments)    Burns up from the inside   . Neurontin [Gabapentin] Other (See Comments)    Makes her pass out and not remember what happened prior to taking med.  . Oxycodone Nausea And Vomiting    dizziness  . Penicillins Anaphylaxis    Patient allergic to all "cillins" Has patient had a PCN reaction causing immediate rash, facial/tongue/throat swelling, SOB or lightheadedness with hypotension: Yes Has patient had a PCN reaction causing severe rash involving mucus membranes or skin necrosis: Yes Has patient had a PCN reaction that required hospitalization: No Has patient had a PCN reaction occurring within the last 10 years: Yes If all of the above answers are "NO", then may proceed with Cephalosporin use.   . Clindamycin/Lincomycin Swelling  . Other Hives and Other (See Comments)    Berry flavored food and drinks  . Diphenhydramine Hcl Rash  . Doxycycline Rash    Thank you for allowing pharmacy to be a part of this patient's care.  Thomasene Rippleavid Gatlin Kittell, PharmD, BCPS Clinical Pharmacist 11/05/2017

## 2017-11-06 ENCOUNTER — Encounter: Payer: Self-pay | Admitting: Obstetrics and Gynecology

## 2017-11-06 LAB — BASIC METABOLIC PANEL
Anion gap: 3 — ABNORMAL LOW (ref 5–15)
BUN: 7 mg/dL (ref 6–20)
CHLORIDE: 114 mmol/L — AB (ref 98–111)
CO2: 24 mmol/L (ref 22–32)
Calcium: 8.6 mg/dL — ABNORMAL LOW (ref 8.9–10.3)
Creatinine, Ser: 0.57 mg/dL (ref 0.44–1.00)
GFR calc Af Amer: >60 mL/min (ref >60)
GFR calc non Af Amer: >60 mL/min (ref >60)
Glucose, Bld: 109 mg/dL — ABNORMAL HIGH (ref 70–99)
POTASSIUM: 3.6 mmol/L (ref 3.5–5.1)
SODIUM: 141 mmol/L (ref 135–145)

## 2017-11-06 LAB — CBC
HEMATOCRIT: 35.7 % (ref 35.0–47.0)
HEMOGLOBIN: 12.4 g/dL (ref 12.0–16.0)
MCH: 30.7 pg (ref 26.0–34.0)
MCHC: 34.7 g/dL (ref 32.0–36.0)
MCV: 88.6 fL (ref 80.0–100.0)
Platelets: 166 10*3/uL (ref 150–440)
RBC: 4.03 MIL/uL (ref 3.80–5.20)
RDW: 13.5 % (ref 11.5–14.5)
WBC: 8.3 10*3/uL (ref 3.6–11.0)

## 2017-11-06 MED ORDER — IBUPROFEN 600 MG PO TABS: 600.0000 mg | ORAL_TABLET | Freq: Four times a day (QID) | ORAL | 3 refills | Status: DC | PRN

## 2017-11-06 MED ORDER — HYDROMORPHONE HCL 2 MG PO TABS: 2.0000 mg | ORAL_TABLET | ORAL | 0 refills | Status: DC | PRN

## 2017-11-06 NOTE — Discharge Summary (Signed)
Physician Discharge Summary  Patient ID: Elizabeth Spencer MRN: [id number] DOB/AGE: 08-16-1985 32 y.o.  Admit date: 11/04/2017 Discharge date: 11/06/2017  Admission Diagnoses:  Discharge Diagnoses:  Active Problems:   Vaginal cuff dehiscence   Discharged Condition: good  Hospital Course: 32 y.o. [id number] presenting with vaginal cuff dehiscence following early initiation of coitus in the initial 6 week postoperative recovery period. Patient taken to OR for surgical repair.  No intraabdominal or bladder in injuries noted.   Patient remained admitted for 24-hr course of postoperative antibiotics to decrease the risk of vaginal cuff cellulitis or abscess formation.    Consults: None  Significant Diagnostic Studies:  Results for orders placed or performed during the hospital encounter of 11/04/17 (from the past 48 hour(s))  Comprehensive metabolic panel     Status: Abnormal   Collection Time: 11/04/17 11:38 PM  Result Value Ref Range   Sodium 142 135 - 145 mmol/L   Potassium 3.7 3.5 - 5.1 mmol/L   Chloride 107 98 - 111 mmol/L   CO2 27 22 - 32 mmol/L   Glucose, Bld 91 70 - 99 mg/dL   BUN 5 (L) 6 - 20 mg/dL   Creatinine, Ser 0.55 0.44 - 1.00 mg/dL   Calcium 9.5 8.9 - 10.3 mg/dL   Total Protein 7.0 6.5 - 8.1 g/dL   Albumin 4.1 3.5 - 5.0 g/dL   AST 16 15 - 41 U/L   ALT 15 0 - 44 U/L   Alkaline Phosphatase 67 38 - 126 U/L   Total Bilirubin 0.6 0.3 - 1.2 mg/dL   GFR calc non Af Amer >60 >60 mL/min   GFR calc Af Amer >60 >60 mL/min    Comment: (NOTE) The eGFR has been calculated using the CKD EPI equation. This calculation has not been validated in all clinical situations. eGFR's persistently <60 mL/min signify possible Chronic Kidney Disease.    Anion gap 8 5 - 15    Comment: Performed at [facility], [address] [place], [place] [address]  CBC with Differential     Status: None   Collection Time: 11/04/17 11:38 PM  Result Value Ref Range   WBC 9.9 3.6 - 11.0 K/uL   RBC  4.86 3.80 - 5.20 MIL/uL   Hemoglobin 14.9 12.0 - 16.0 g/dL   HCT 42.4 35.0 - 47.0 %   MCV 87.4 80.0 - 100.0 fL   MCH 30.6 26.0 - 34.0 pg   MCHC 35.0 32.0 - 36.0 g/dL   RDW 13.5 11.5 - 14.5 %   Platelets 199 150 - 440 K/uL   Neutrophils Relative % 58 %   Neutro Abs 5.8 1.4 - 6.5 K/uL   Lymphocytes Relative 29 %   Lymphs Abs 2.9 1.0 - 3.6 K/uL   Monocytes Relative 9 %   Monocytes Absolute 0.9 0.2 - 0.9 K/uL   Eosinophils Relative 3 %   Eosinophils Absolute 0.3 0 - 0.7 K/uL   Basophils Relative 1 %   Basophils Absolute 0.1 0 - 0.1 K/uL    Comment: Performed at [facility], [address] [place], [place] [address]  Lactic acid, plasma     Status: None   Collection Time: 11/04/17 11:38 PM  Result Value Ref Range   Lactic Acid, Venous 1.1 0.5 - 1.9 mmol/L    Comment: Performed at [facility], [address], [place], [place] [address]  Type and screen [place]     Status: None   Collection Time: 11/04/17  11:38 PM  Result Value Ref Range   ABO/RH(D) A NEG    Antibody Screen NEG    Sample Expiration      11/07/2017 Performed at [facility], [address] [place], [place] [address]   CBC     Status: None   Collection Time: 11/06/17  6:19 AM  Result Value Ref Range   WBC 8.3 3.6 - 11.0 K/uL   RBC 4.03 3.80 - 5.20 MIL/uL   Hemoglobin 12.4 12.0 - 16.0 g/dL   HCT 35.7 35.0 - 47.0 %   MCV 88.6 80.0 - 100.0 fL   MCH 30.7 26.0 - 34.0 pg   MCHC 34.7 32.0 - 36.0 g/dL   RDW 13.5 11.5 - 14.5 %   Platelets 166 150 - 440 K/uL    Comment: Performed at [facility], [address] [place], [place] [address]  Basic metabolic panel     Status: Abnormal   Collection Time: 11/06/17  6:19 AM  Result Value Ref Range   Sodium 141 135 - 145 mmol/L   Potassium 3.6 3.5 - 5.1 mmol/L   Chloride 114 (H) 98 - 111 mmol/L   CO2 24 22 - 32 mmol/L   Glucose, Bld 109 (H) 70 - 99 mg/dL   BUN 7 6 - 20 mg/dL   Creatinine, Ser 0.57 0.44  - 1.00 mg/dL   Calcium 8.6 (L) 8.9 - 10.3 mg/dL   GFR calc non Af Amer >60 >60 mL/min   GFR calc Af Amer >60 >60 mL/min    Comment: (NOTE) The eGFR has been calculated using the CKD EPI equation. This calculation has not been validated in all clinical situations. eGFR's persistently <60 mL/min signify possible Chronic Kidney Disease.    Anion gap 3 (L) 5 - 15    Comment: Performed at [facility], [address] [place], [place] [address]   Ct Abdomen Pelvis W Contrast  Result Date: 11/05/2017 CLINICAL DATA:  Lower abdominal and pelvic pain. Assess for ruptured vaginal cuff, status post hysterectomy September 29, 2017. History of gallstones. EXAM: CT ABDOMEN AND PELVIS WITH CONTRAST TECHNIQUE: Multidetector CT imaging of the abdomen and pelvis was performed using the standard protocol following bolus administration of intravenous contrast. CONTRAST:  123m OMNIPAQUE IOHEXOL 300 MG/ML  SOLN COMPARISON:  Acute abdominal series May 01, 2015 FINDINGS: LOWER CHEST: Dependent atelectasis. Included heart size is normal. No pericardial effusion. HEPATOBILIARY: Multiple peripherally calcified gallstones measuring to 2.8 cm without CT findings of acute cholecystitis. Subcentimeter probable cyst RIGHT lobe of the liver. Focal fatty infiltration about the falciform ligament. PANCREAS: Normal. SPLEEN: Normal. ADRENALS/URINARY TRACT: Kidneys are orthotopic, demonstrating symmetric enhancement. No nephrolithiasis, hydronephrosis or solid renal masses. The unopacified ureters are normal in course and caliber. Delayed imaging through the kidneys demonstrates symmetric prompt contrast excretion within the proximal urinary collecting system. Urinary bladder is partially distended and unremarkable. Normal adrenal glands. STOMACH/BOWEL: The stomach, small and large bowel are normal in course and caliber without inflammatory changes. Mild colonic diverticulosis. Normal appendix. VASCULAR/LYMPHATIC: Aortoiliac  vessels are normal in course and caliber. No lymphadenopathy by CT size criteria. REPRODUCTIVE: Status post hysterectomy. Normal appearance of the vaginal cuff. OTHER: No intraperitoneal free fluid or free air. Trace fat stranding about the vaginal cuff. MUSCULOSKELETAL: Nonacute. Small fat containing umbilical hernia. Mild thoracic spondylosis. IMPRESSION: 1. Status post hysterectomy without complication or acute intra-abdominal/pelvic process. 2. Cholelithiasis. Electronically Signed   By: CElon AlasM.D.   On: 11/05/2017 01:09   Treatments:  1) Diagnostic laparoscopy, cystoscopy, surgical repair vaginal cuff dehiscence 11/05/2017 2) 24-hr course of IV antibiotics  Discharge Exam: Blood pressure 112/88, pulse (!) 55, temperature 98 F (36.7 C), resp. rate 18, height 5' 3" (1.6 m), weight 220 lb (99.8 kg), last menstrual period 08/22/2017, SpO2 98 %.  Temp:  [97.7 F (36.5 C)-98.5 F (36.9 C)] 98 F (36.7 C) (08/04 0822) Pulse Rate:  [55-82] 55 (08/04 0822) Resp:  [18-20] 18 (08/04 5397) BP: (105-119)/(59-88) 112/88 (08/04 0822) SpO2:  [97 %-100 %] 98 % (08/04 0822)  General appearance: alert, appears stated age and no distress Resp: normal respiratory effort GI: soft, non-tender; bowel sounds normal; no masses,  no organomegaly Extremities: extremities normal, atraumatic, no cyanosis or edema  Umbilical trocar site D/C/I  Disposition: Discharge disposition: 01-Home or Self Care       Discharge Instructions    Activity as tolerated   Complete by:  As directed    No lifting >10lbs for 6 weeks   Call MD for:   Complete by:  As directed    For heavy vaginal bleeding greater than 1 pad an hour   Call MD for:  difficulty breathing, headache or visual disturbances   Complete by:  As directed    Call MD for:  extreme fatigue   Complete by:  As directed    Call MD for:  hives   Complete by:  As directed    Call MD for:  persistant dizziness or light-headedness   Complete by:   As directed    Call MD for:  persistant nausea and vomiting   Complete by:  As directed    Call MD for:  severe uncontrolled pain   Complete by:  As directed    Call MD for:  temperature >100.4   Complete by:  As directed    Diet general   Complete by:  As directed    Sexual acrtivity   Complete by:  As directed    No intercourse, tampons, or anything vaginally for 12 weeks (3 months)     Allergies as of 11/06/2017      Reactions   Azithromycin Swelling, Other (See Comments)   Meloxicam Other (See Comments)   Burns up from the inside   Neurontin [gabapentin] Other (See Comments)   Makes her pass out and not remember what happened prior to taking med.   Oxycodone Nausea And Vomiting   dizziness   Penicillins Anaphylaxis   Patient allergic to all "cillins" Has patient had a PCN reaction causing immediate rash, facial/tongue/throat swelling, SOB or lightheadedness with hypotension: Yes Has patient had a PCN reaction causing severe rash involving mucus membranes or skin necrosis: Yes Has patient had a PCN reaction that required hospitalization: No Has patient had a PCN reaction occurring within the last 10 years: Yes If all of the above answers are "NO", then may proceed with Cephalosporin use.   Clindamycin/lincomycin Swelling   Other Hives, Other (See Comments)   Berry flavored food and drinks   Diphenhydramine Hcl Rash   Doxycycline Rash      Medication List    TAKE these medications   albuterol 108 (90 Base) MCG/ACT inhaler Commonly known as:  PROVENTIL HFA;VENTOLIN HFA Inhale 2 puffs into the lungs every 6 (six) hours as needed for wheezing or shortness of breath.   CENTRUM ADULTS PO Take 1 tablet by mouth at bedtime.   cetirizine 10 MG tablet Commonly known as:  ZYRTEC Take 10 mg by mouth at  bedtime.   EPIPEN 2-PAK 0.3 mg/0.3 mL Soaj injection Generic drug:  EPINEPHrine INJECT INTO THIGH MUSCLE THROUGH CLOTHES AS NEEDED SEVERE ALLERGIC REACTION   HYDROmorphone  2 MG tablet Commonly known as:  DILAUDID Take 1 tablet (2 mg total) by mouth every 4 (four) hours as needed for moderate pain or severe pain. What changed:  how much to take   ibuprofen 600 MG tablet Commonly known as:  ADVIL,MOTRIN Take 1 tablet (600 mg total) by mouth every 6 (six) hours as needed.   ketoconazole 2 % shampoo Commonly known as:  NIZORAL SHAMPOO DAILY,AFTER UNDER CONTROL ONCE WEEKLY.APPLY & LATHER-LEAVE ON FOR 5MIN BEFORE WASHING OFF   omeprazole 20 MG capsule Commonly known as:  PRILOSEC Take 20 mg by mouth at bedtime.   RETIN-A 0.025 % cream Generic drug:  tretinoin APPLY PEA SIZE AMOUNT TO FACE AT NIGHT   triamcinolone cream 0.1 % Commonly known as:  KENALOG Apply topically once daily as needed for psoriasis Damien Fusi      Follow-up Information    Malachy Mood, MD. Go on 11/14/2017.   Specialty:  Obstetrics and Gynecology Why:  3:50 PM Contact information: [address] [place] [address] [phone]           Signed: Malachy Mood 11/06/2017, 9:59 AM

## 2017-11-06 NOTE — Progress Notes (Signed)
Discharge instructions complete. Patient verbalizes understanding of teaching. Patient discharged home via wheelchair at 1240.

## 2017-11-06 NOTE — Progress Notes (Signed)
Pharmacy Antibiotic Note  Elizabeth Spencer is a 32 y.o. female admitted on 11/04/2017 with vaginal cuff dehiscence.  Pharmacy has been consulted for gentamicin dosing.  Plan: Pharmacy was consulted for 24 hours of IV gentamicin which is now completed. Pharmacy will sign off at this time. Please reconsult if further doses are required, and consider IV to PO metronidazole if that therapy is to continue.   Height: 5\' 3"  (160 cm) Weight: 220 lb (99.8 kg) IBW/kg (Calculated) : 52.4  Temp (24hrs), Avg:98.1 F (36.7 C), Min:97.7 F (36.5 C), Max:98.5 F (36.9 C)  Recent Labs  Lab 11/04/17 2338 11/06/17 0619  WBC 9.9 8.3  CREATININE 0.55 0.57  LATICACIDVEN 1.1  --     Estimated Creatinine Clearance: 111.7 mL/min (by C-G formula based on SCr of 0.57 mg/dL).    Allergies  Allergen Reactions  . Azithromycin Swelling and Other (See Comments)  . Meloxicam Other (See Comments)    Burns up from the inside   . Neurontin [Gabapentin] Other (See Comments)    Makes her pass out and not remember what happened prior to taking med.  . Oxycodone Nausea And Vomiting    dizziness  . Penicillins Anaphylaxis    Patient allergic to all "cillins" Has patient had a PCN reaction causing immediate rash, facial/tongue/throat swelling, SOB or lightheadedness with hypotension: Yes Has patient had a PCN reaction causing severe rash involving mucus membranes or skin necrosis: Yes Has patient had a PCN reaction that required hospitalization: No Has patient had a PCN reaction occurring within the last 10 years: Yes If all of the above answers are "NO", then may proceed with Cephalosporin use.   . Clindamycin/Lincomycin Swelling  . Other Hives and Other (See Comments)    Berry flavored food and drinks  . Diphenhydramine Hcl Rash  . Doxycycline Rash    Thank you for allowing pharmacy to be a part of this patient's care.  Corian Handley A. Dahlia Bailiffookson, PharmD, BCPS Clinical Pharmacist 11/06/2017

## 2017-11-14 ENCOUNTER — Ambulatory Visit (INDEPENDENT_AMBULATORY_CARE_PROVIDER_SITE_OTHER): Payer: Medicaid Other | Admitting: Obstetrics and Gynecology

## 2017-11-14 ENCOUNTER — Encounter: Payer: Self-pay | Admitting: Obstetrics and Gynecology

## 2017-11-14 VITALS — BP 110/60 | Wt 206.0 lb

## 2017-11-14 DIAGNOSIS — Z4889 Encounter for other specified surgical aftercare: Secondary | ICD-10-CM

## 2017-11-17 NOTE — Progress Notes (Signed)
Postoperative Follow-up Patient presents post op from vaginal cuff dehiscence repair 2weeks ago for vaginal cuff dehiscence.  Subjective: Patient reports marked improvement in her preop symptoms. Eating a regular diet without difficulty. The patient is not having any pain.  Activity: normal activities of daily living.  No vaginal bleeding, no fevers, no chills.  Objective: Blood pressure 110/60, weight 206 lb (93.4 kg), last menstrual period 08/22/2017.   Gen: NAD HEENT: normocephalic, anicteric Pulmonary: no increased work of breathing, trochar site D/C/I Ext: no edema  Admission on 11/04/2017, Discharged on 11/06/2017  Component Date Value Ref Range Status  . Sodium 11/04/2017 142  135 - 145 mmol/L Final  . Potassium 11/04/2017 3.7  3.5 - 5.1 mmol/L Final  . Chloride 11/04/2017 107  98 - 111 mmol/L Final  . CO2 11/04/2017 27  22 - 32 mmol/L Final  . Glucose, Bld 11/04/2017 91  70 - 99 mg/dL Final  . BUN 11/04/2017 5* 6 - 20 mg/dL Final  . Creatinine, Ser 11/04/2017 0.55  0.44 - 1.00 mg/dL Final  . Calcium 11/04/2017 9.5  8.9 - 10.3 mg/dL Final  . Total Protein 11/04/2017 7.0  6.5 - 8.1 g/dL Final  . Albumin 11/04/2017 4.1  3.5 - 5.0 g/dL Final  . AST 11/04/2017 16  15 - 41 U/L Final  . ALT 11/04/2017 15  0 - 44 U/L Final  . Alkaline Phosphatase 11/04/2017 67  38 - 126 U/L Final  . Total Bilirubin 11/04/2017 0.6  0.3 - 1.2 mg/dL Final  . GFR calc non Af Amer 11/04/2017 >60  >60 mL/min Final  . GFR calc Af Amer 11/04/2017 >60  >60 mL/min Final   Comment: (NOTE) The eGFR has been calculated using the CKD EPI equation. This calculation has not been validated in all clinical situations. eGFR's persistently <60 mL/min signify possible Chronic Kidney Disease.   Georgiann Hahn gap 11/04/2017 8  5 - 15 Final   Performed at [facility], [address] [place], [place] [address]  . WBC 11/04/2017 9.9  3.6 - 11.0 K/uL Final  . RBC 11/04/2017 4.86  3.80 - 5.20 MIL/uL  Final  . Hemoglobin 11/04/2017 14.9  12.0 - 16.0 g/dL Final  . HCT 11/04/2017 42.4  35.0 - 47.0 % Final  . MCV 11/04/2017 87.4  80.0 - 100.0 fL Final  . MCH 11/04/2017 30.6  26.0 - 34.0 pg Final  . MCHC 11/04/2017 35.0  32.0 - 36.0 g/dL Final  . RDW 11/04/2017 13.5  11.5 - 14.5 % Final  . Platelets 11/04/2017 199  150 - 440 K/uL Final  . Neutrophils Relative % 11/04/2017 58  % Final  . Neutro Abs 11/04/2017 5.8  1.4 - 6.5 K/uL Final  . Lymphocytes Relative 11/04/2017 29  % Final  . Lymphs Abs 11/04/2017 2.9  1.0 - 3.6 K/uL Final  . Monocytes Relative 11/04/2017 9  % Final  . Monocytes Absolute 11/04/2017 0.9  0.2 - 0.9 K/uL Final  . Eosinophils Relative 11/04/2017 3  % Final  . Eosinophils Absolute 11/04/2017 0.3  0 - 0.7 K/uL Final  . Basophils Relative 11/04/2017 1  % Final  . Basophils Absolute 11/04/2017 0.1  0 - 0.1 K/uL Final   Performed at [facility], [address], [place], [place] [address]  . Lactic Acid, Venous 11/04/2017 1.1  0.5 - 1.9 mmol/L Final   Performed at [facility], [address] [place], [place] [address]  . ABO/RH(D) 11/04/2017 A  NEG   Final  . Antibody Screen 11/04/2017 NEG   Final  . Sample Expiration 11/04/2017    Final                   Value:11/07/2017 Performed at [facility], [address], [place], [place] [address]   . WBC 11/06/2017 8.3  3.6 - 11.0 K/uL Final  . RBC 11/06/2017 4.03  3.80 - 5.20 MIL/uL Final  . Hemoglobin 11/06/2017 12.4  12.0 - 16.0 g/dL Final  . HCT 11/06/2017 35.7  35.0 - 47.0 % Final  . MCV 11/06/2017 88.6  80.0 - 100.0 fL Final  . MCH 11/06/2017 30.7  26.0 - 34.0 pg Final  . MCHC 11/06/2017 34.7  32.0 - 36.0 g/dL Final  . RDW 11/06/2017 13.5  11.5 - 14.5 % Final  . Platelets 11/06/2017 166  150 - 440 K/uL Final   Performed at [facility], [address], [place], [place] [address]  . Sodium 11/06/2017 141  135 - 145 mmol/L Final  . Potassium 11/06/2017 3.6  3.5 - 5.1 mmol/L  Final  . Chloride 11/06/2017 114* 98 - 111 mmol/L Final  . CO2 11/06/2017 24  22 - 32 mmol/L Final  . Glucose, Bld 11/06/2017 109* 70 - 99 mg/dL Final  . BUN 11/06/2017 7  6 - 20 mg/dL Final  . Creatinine, Ser 11/06/2017 0.57  0.44 - 1.00 mg/dL Final  . Calcium 11/06/2017 8.6* 8.9 - 10.3 mg/dL Final  . GFR calc non Af Amer 11/06/2017 >60  >60 mL/min Final  . GFR calc Af Amer 11/06/2017 >60  >60 mL/min Final   Comment: (NOTE) The eGFR has been calculated using the CKD EPI equation. This calculation has not been validated in all clinical situations. eGFR's persistently <60 mL/min signify possible Chronic Kidney Disease.   Georgiann Hahn gap 11/06/2017 3* 5 - 15 Final   Performed at [facility], [place], [place] [address]    Assessment: 32 y.o. s/p vaginal cuff dehiscence repair stable  Plan: Patient has done well after surgery with no apparent complications.  I have discussed the post-operative course to date, and the expected progress moving forward.  The patient understands what complications to be concerned about.  I will see the patient in routine follow up, or sooner if needed.    Activity plan: No heavy lifting. No intercourse for 12 week postop.  We discussed nothing per vagina but I confirmed oral intercourse if fine.  Return in about 8 weeks (around 01/09/2018) for postop.    Malachy Mood, MD, [facility], [facility] 11/17/2017, 9:09 AM

## 2017-12-28 ENCOUNTER — Encounter: Payer: Self-pay | Admitting: Obstetrics & Gynecology

## 2017-12-28 ENCOUNTER — Other Ambulatory Visit (HOSPITAL_COMMUNITY)
Admission: RE | Admit: 2017-12-28 | Discharge: 2017-12-28 | Disposition: A | Discharge status: Home / Self Care | Payer: Medicaid Other | Source: Ambulatory Visit | Attending: Obstetrics & Gynecology | Admitting: Obstetrics & Gynecology

## 2017-12-28 ENCOUNTER — Ambulatory Visit (INDEPENDENT_AMBULATORY_CARE_PROVIDER_SITE_OTHER): Payer: Medicaid Other | Admitting: Obstetrics & Gynecology

## 2017-12-28 VITALS — BP 100/60 | Ht 63.0 in | Wt 209.0 lb

## 2017-12-28 DIAGNOSIS — B3731 Acute candidiasis of vulva and vagina: Secondary | ICD-10-CM

## 2017-12-28 DIAGNOSIS — B373 Candidiasis of vulva and vagina: Secondary | ICD-10-CM | POA: Insufficient documentation

## 2017-12-28 MED ORDER — FLUCONAZOLE 150 MG PO TABS: 150.0000 mg | ORAL_TABLET | Freq: Once | ORAL | 1 refills | Status: AC

## 2017-12-28 NOTE — Progress Notes (Signed)
HPI:      [name] is a 32 y.o. [phone] who LMP was Patient's last menstrual period was 08/22/2017., presents today for a problem visit.  She complains of:  Vaginitis: Patient complains of an abnormal vaginal discharge for 3 days. Vaginal symptoms include local irritation and itching.Vulvar symptoms include none.STI Risk: Very low risk of STD exposureDischarge described as: white, thick, curd-like and this was first 2 days only.Other associated symptoms: none.Menstrual pattern: She had been bleeding none, recent TLH. Contraception: status post hysterectomy  PMHx: She  has a past medical history of Arthritis, Asthma, Brain aneurysm (2007), Complication of anesthesia, Family history of adverse reaction to anesthesia, Gallstones, GERD (gastroesophageal reflux disease), Headache, History of chronic cough, Pneumonia, PTSD (post-traumatic stress disorder), PTSD (post-traumatic stress disorder), and Thyroid condition. Also,  has a past surgical history that includes Cesarean section; laparoscopy (N/A, 09/01/2017); Hysteroscopy w/D&C (N/A, 09/01/2017); Total laparoscopic hysterectomy with salpingectomy (Bilateral, 09/29/2017); Cystoscopy (N/A, 09/29/2017); Abdominal hysterectomy; laparoscopy (N/A, 11/05/2017); Repair vaginal cuff (N/A, 11/05/2017); and Cystoscopy (N/A, 11/05/2017)., family history includes Alcohol abuse in her father and mother; COPD in her father; Deep vein thrombosis in her mother; Emphysema in her father; Heart disease in her father; Hypertension in her mother.,  reports that she has been smoking cigarettes. She has a 18.00 pack-year smoking history. She has never used smokeless tobacco. She reports that she drinks alcohol. She reports that she has current or past drug history. Drug: Marijuana.  She has a current medication list which includes the following prescription(s): albuterol, cetirizine, epipen 2-pak, hydromorphone, ibuprofen, ketoconazole, multiple vitamins-minerals, omeprazole,  retin-a, triamcinolone cream, and fluconazole. Also, is allergic to azithromycin; meloxicam; neurontin [gabapentin]; oxycodone; penicillins; clindamycin/lincomycin; other; diphenhydramine hcl; and doxycycline.  Review of Systems  Constitutional: Negative for chills, fever and malaise/fatigue.  HENT: Negative for congestion, sinus pain and sore throat.   Eyes: Negative for blurred vision and pain.  Respiratory: Negative for cough and wheezing.   Cardiovascular: Negative for chest pain and leg swelling.  Gastrointestinal: Negative for abdominal pain, constipation, diarrhea, heartburn, nausea and vomiting.  Genitourinary: Negative for dysuria, frequency, hematuria and urgency.  Musculoskeletal: Negative for back pain, joint pain, myalgias and neck pain.  Skin: Negative for itching and rash.  Neurological: Negative for dizziness, tremors and weakness.  Endo/Heme/Allergies: Does not bruise/bleed easily.  Psychiatric/Behavioral: Negative for depression. The patient is not nervous/anxious and does not have insomnia.    Objective: BP 100/60   Ht 5\' 3"  (1.6 m)   Wt 209 lb (94.8 kg)   LMP 08/22/2017 Comment: says she just spots.  BMI 37.02 kg/m  Physical Exam  Constitutional: She is oriented to person, place, and time. She appears well-developed and well-nourished. No distress.  Genitourinary: Vagina normal. Pelvic exam was performed with patient supine. There is no rash, tenderness or lesion on the right labia. There is no rash, tenderness or lesion on the left labia. No erythema or bleeding in the vagina.  Genitourinary Comments: Cuff intact/ no lesions  Absent uterus and cervix  HENT:  Head: Normocephalic and atraumatic.  Nose: Nose normal.  Mouth/Throat: Oropharynx is clear and moist.  Abdominal: Soft. She exhibits no distension. There is no tenderness.  Musculoskeletal: Normal range of motion.  Neurological: She is alert and oriented to person, place, and time. No cranial nerve deficit.   Skin: Skin is warm and dry.  Psychiatric: She has a normal mood and affect.   WET PREP:   positive hyphae Findings are consistent with monilia vaginitis.  ASSESSMENT/PLAN:  New Onset Yeast Infection:  Candida vaginitis    -  Primary   Relevant Medications   fluconazole (DIFLUCAN) 150 MG tablet   Other Relevant Orders   Cervicovaginal ancillary only      Annamarie Major, MD, [facility], [facility] 12/28/2017  3:46 PM

## 2017-12-28 NOTE — Patient Instructions (Signed)
Fluconazole tablets °What is this medicine? °FLUCONAZOLE (floo KON na zole) is an antifungal medicine. It is used to treat certain kinds of fungal or yeast infections. °This medicine may be used for other purposes; ask your health care provider or pharmacist if you have questions. °COMMON BRAND NAME(S): Diflucan °What should I tell my health care provider before I take this medicine? °They need to know if you have any of these conditions: °-history of irregular heart beat °-kidney disease °-an unusual or allergic reaction to fluconazole, other azole antifungals, medicines, foods, dyes, or preservatives °-pregnant or trying to get pregnant °-breast-feeding °How should I use this medicine? °Take this medicine by mouth. Follow the directions on the prescription label. Do not take your medicine more often than directed. °Talk to your pediatrician regarding the use of this medicine in children. Special care may be needed. This medicine has been used in children as young as 6 months of age. °Overdosage: If you think you have taken too much of this medicine contact a poison control center or emergency room at once. °NOTE: This medicine is only for you. Do not share this medicine with others. °What if I miss a dose? °If you miss a dose, take it as soon as you can. If it is almost time for your next dose, take only that dose. Do not take double or extra doses. °What may interact with this medicine? °Do not take this medicine with any of the following medications: °-astemizole °-certain medicines for irregular heart beat like dofetilide, dronedarone, quinidine °-cisapride °-erythromycin °-lomitapide °-other medicines that prolong the QT interval (cause an abnormal heart rhythm) °-pimozide °-terfenadine °-thioridazine °-tolvaptan °-ziprasidone °This medicine may also interact with the following medications: °-antiviral medicines for HIV or AIDS °-birth control pills °-certain antibiotics like rifabutin, rifampin °-certain  medicines for blood pressure like amlodipine, isradipine, felodipine, hydrochlorothiazide, losartan, nifedipine °-certain medicines for cancer like cyclophosphamide, vinblastine, vincristine °-certain medicines for cholesterol like atorvastatin, lovastatin, fluvastatin, simvastatin °-certain medicines for depression, anxiety, or psychotic disturbances like amitriptyline, midazolam, nortriptyline, triazolam °-certain medicines for diabetes like glipizide, glyburide, tolbutamide °-certain medicines for pain like alfentanil, fentanyl, methadone °-certain medicines for seizures like carbamazepine, phenytoin °-certain medicines that treat or prevent blood clots like warfarin °-halofantrine °-medicines that lower your chance of fighting infection like cyclosporine, prednisone, tacrolimus °-NSAIDS, medicines for pain and inflammation, like celecoxib, diclofenac, flurbiprofen, ibuprofen, meloxicam, naproxen °-other medicines for fungal infections °-sirolimus °-theophylline °-tofacitinib °This list may not describe all possible interactions. Give your health care provider a list of all the medicines, herbs, non-prescription drugs, or dietary supplements you use. Also tell them if you smoke, drink alcohol, or use illegal drugs. Some items may interact with your medicine. °What should I watch for while using this medicine? °Visit your doctor or health care professional for regular checkups. If you are taking this medicine for a long time you may need blood work. Tell your doctor if your symptoms do not improve. Some fungal infections need many weeks or months of treatment to cure. °Alcohol can increase possible damage to your liver. Avoid alcoholic drinks. °If you have a vaginal infection, do not have sex until you have finished your treatment. You can wear a sanitary napkin. Do not use tampons. Wear freshly washed cotton, not synthetic, panties. °What side effects may I notice from receiving this medicine? °Side effects that  you should report to your doctor or health care professional as soon as possible: °-allergic reactions like skin rash or itching, hives, swelling of the   lips, mouth, tongue, or throat °-dark urine °-feeling dizzy or faint °-irregular heartbeat or chest pain °-redness, blistering, peeling or loosening of the skin, including inside the mouth °-trouble breathing °-unusual bruising or bleeding °-vomiting °-yellowing of the eyes or skin °Side effects that usually do not require medical attention (report to your doctor or health care professional if they continue or are bothersome): °-changes in how food tastes °-diarrhea °-headache °-stomach upset or nausea °This list may not describe all possible side effects. Call your doctor for medical advice about side effects. You may report side effects to FDA at 1-800-FDA-1088. °Where should I keep my medicine? °Keep out of the reach of children. °Store at room temperature below 30 degrees C (86 degrees F). Throw away any medicine after the expiration date. °NOTE: This sheet is a summary. It may not cover all possible information. If you have questions about this medicine, talk to your doctor, pharmacist, or health care provider. °© 2018 [employer]vier/Gold Standard (2012-10-28 19:37:38) ° °

## 2017-12-30 LAB — CERVICOVAGINAL ANCILLARY ONLY
Bacterial vaginitis: NEGATIVE
Candida vaginitis: POSITIVE — AB

## 2017-12-30 NOTE — Progress Notes (Signed)
Let her know culture confirms yeast infection and no other infection. Hopefully the DIFLUCAN has helped w her sx's.

## 2018-01-09 ENCOUNTER — Encounter: Payer: Self-pay | Admitting: Obstetrics and Gynecology

## 2018-01-09 ENCOUNTER — Ambulatory Visit (INDEPENDENT_AMBULATORY_CARE_PROVIDER_SITE_OTHER): Payer: Medicaid Other | Admitting: Obstetrics and Gynecology

## 2018-01-09 VITALS — BP 128/80 | HR 116 | Wt 213.0 lb

## 2018-01-09 DIAGNOSIS — Z4889 Encounter for other specified surgical aftercare: Secondary | ICD-10-CM

## 2018-01-09 NOTE — Progress Notes (Signed)
      Postoperative Follow-up Patient presents post op from [facility], BS, and cystoscopy 09/29/2017 with repair of vaginal cuff dehiscence 11/05/2016.  Subjective: Patient reports some improvement in her preop symptoms. Eating a regular diet without difficulty. The patient is not having any pain.  Activity: normal activities of daily living.  Objective: Blood pressure 128/80, pulse (!) 116, weight 213 lb (96.6 kg), last menstrual period 08/22/2017.  General: NAD Pulmonary: no increased work of breathing Abdomen: soft, non-tender, non-distended, incision(s) D/C/I GU: normal external female genitalia vaginal cuff intact, well healed Extremities: no edema Neurologic: normal gait   Assessment: 32 y.o. s/p TLH, BS, cystoscopy and cuff dehiscence repair unstable  Plan: Patient has done well after surgery with no apparent complications.  I have discussed the post-operative course to date, and the expected progress moving forward.  The patient understands what complications to be concerned about.  I will see the patient in routine follow up, or sooner if needed.    Activity plan: No restriction.   Vena Austria, MD, Ever[facility], [facility] 01/09/2018, 1:25 PM

## 2018-02-07 ENCOUNTER — Other Ambulatory Visit: Payer: Self-pay | Admitting: "Endocrinology

## 2018-02-07 DIAGNOSIS — E042 Nontoxic multinodular goiter: Secondary | ICD-10-CM

## 2018-02-07 DIAGNOSIS — E0590 Thyrotoxicosis, unspecified without thyrotoxic crisis or storm: Secondary | ICD-10-CM

## 2018-06-14 DIAGNOSIS — R350 Frequency of micturition: Secondary | ICD-10-CM

## 2018-06-21 ENCOUNTER — Other Ambulatory Visit: Payer: Self-pay | Admitting: "Endocrinology

## 2018-06-21 DIAGNOSIS — E042 Nontoxic multinodular goiter: Secondary | ICD-10-CM

## 2018-06-21 DIAGNOSIS — E0590 Thyrotoxicosis, unspecified without thyrotoxic crisis or storm: Secondary | ICD-10-CM

## 2018-07-28 DIAGNOSIS — J302 Other seasonal allergic rhinitis: Secondary | ICD-10-CM | POA: Insufficient documentation

## 2018-08-08 DIAGNOSIS — E042 Nontoxic multinodular goiter: Secondary | ICD-10-CM | POA: Insufficient documentation

## 2018-08-08 DIAGNOSIS — E0590 Thyrotoxicosis, unspecified without thyrotoxic crisis or storm: Secondary | ICD-10-CM | POA: Insufficient documentation

## 2018-08-17 ENCOUNTER — Ambulatory Visit
Admission: RE | Admit: 2018-08-17 | Discharge: 2018-08-17 | Disposition: A | Discharge status: Home / Self Care | Payer: Medicaid Other | Source: Ambulatory Visit | Attending: "Endocrinology | Admitting: "Endocrinology

## 2018-08-17 ENCOUNTER — Other Ambulatory Visit: Payer: Self-pay

## 2018-08-17 DIAGNOSIS — E0590 Thyrotoxicosis, unspecified without thyrotoxic crisis or storm: Secondary | ICD-10-CM | POA: Insufficient documentation

## 2018-08-17 DIAGNOSIS — E042 Nontoxic multinodular goiter: Secondary | ICD-10-CM | POA: Diagnosis present

## 2018-08-17 MED ORDER — SODIUM IODIDE I-123 7.4 MBQ CAPS
396.6600 | ORAL_CAPSULE | Freq: Once | ORAL | Status: AC
  Administered 2018-08-17: 396.66 via ORAL

## 2018-08-18 ENCOUNTER — Other Ambulatory Visit: Payer: Self-pay

## 2018-08-18 ENCOUNTER — Encounter
Admission: RE | Admit: 2018-08-18 | Discharge: 2018-08-18 | Disposition: A | Discharge status: Home / Self Care | Payer: Medicaid Other | Source: Ambulatory Visit | Attending: "Endocrinology | Admitting: "Endocrinology

## 2018-09-13 ENCOUNTER — Other Ambulatory Visit: Payer: Self-pay

## 2018-09-13 ENCOUNTER — Encounter: Payer: Self-pay | Admitting: Obstetrics and Gynecology

## 2018-09-13 ENCOUNTER — Ambulatory Visit (INDEPENDENT_AMBULATORY_CARE_PROVIDER_SITE_OTHER): Payer: Medicaid Other | Admitting: Obstetrics and Gynecology

## 2018-09-13 VITALS — BP 130/80 | Ht 63.0 in | Wt 206.2 lb

## 2018-09-13 DIAGNOSIS — N760 Acute vaginitis: Secondary | ICD-10-CM

## 2018-09-13 DIAGNOSIS — R350 Frequency of micturition: Secondary | ICD-10-CM

## 2018-09-13 DIAGNOSIS — Z113 Encounter for screening for infections with a predominantly sexual mode of transmission: Secondary | ICD-10-CM | POA: Diagnosis not present

## 2018-09-13 DIAGNOSIS — Z206 Contact with and (suspected) exposure to human immunodeficiency virus [HIV]: Secondary | ICD-10-CM

## 2018-09-13 DIAGNOSIS — R21 Rash and other nonspecific skin eruption: Secondary | ICD-10-CM

## 2018-09-13 LAB — POCT URINALYSIS DIPSTICK
Protein, UA: POSITIVE — AB
Spec Grav, UA: >=1.03 — AB (ref 1.010–1.025)

## 2018-09-13 NOTE — Progress Notes (Signed)
Obstetrics & Gynecology Office Visit   Chief Complaint:  Chief Complaint  Patient presents with  . Vaginitis    itchiness only, no discharge/odor/irritation x 2 days  . Urinary Tract Infection    frequency and burning sensation when urinating, no blood in urine x 2 days    History of Present Illness: Patient is a 33 y.o. [id number] presents for STD testing.  The patient has not noted intermenstrual spotting,  has not experienced postcoital bleeding, and does not report increased vaginal discharge.  There is a history of prior sexually transmitted infection(s).     The patient is sexually active. She currently uses  hysterectomy for contraception. She never uses condoms or barrier methods to prevent the spread of sexually transmitted infections.  Partner has intercourse with someone with known HIV.    She also reports a small purple spots on her trunk has started in the last few weeks.  Non-pruritic.  Review of Systems: Review of Systems  Constitutional: Negative.   Gastrointestinal: Negative for abdominal pain.  Genitourinary: Positive for frequency. Negative for dysuria, flank pain, hematuria and urgency.  Skin: Positive for rash. Negative for itching.    Past Medical History:  Past Medical History:  Diagnosis Date  . Arthritis   . Asthma    WELL CONTROLLED  . Brain aneurysm 2007   NEUROLOGY NOTE DOES NOT MENTION ANEURYSM BUT PT STATES SHE DID NOT HAVE TO HAVE SURGERY  . Complication of anesthesia    FOR 1 C-SECTION PT WAS ITCHING AND VERY RED ON HER FACE  . Family history of adverse reaction to anesthesia    brother, neice and nephew got red in face with hives  . Gallstones   . GERD (gastroesophageal reflux disease)   . Headache    CHRONIC HEADACHES  . History of chronic cough    DRY  . Pneumonia   . PTSD (post-traumatic stress disorder)   . PTSD (post-traumatic stress disorder)   . Thyroid condition    PT WAS JUST TOLD ON 08-25-17 THAT SHE HAS A THYROID PROBLEM  AND IS GOING TO F/U WITH ENDOCRINOLOGIST IN 2 WEEKS    Past Surgical History:  Past Surgical History:  Procedure Laterality Date  . ABDOMINAL HYSTERECTOMY    . CESAREAN SECTION     x3  . CYSTOSCOPY N/A 09/29/2017   Procedure: CYSTOSCOPY;  Surgeon: Vena AustriaStaebler, Numair Masden, MD;  Location: [facility] ORS;  Service: Gynecology;  Laterality: N/A;  . CYSTOSCOPY N/A 11/05/2017   Procedure: CYSTOSCOPY;  Surgeon: Vena AustriaStaebler, Terisha Losasso, MD;  Location: [facility];  Service: Gynecology;  Laterality: N/A;  . HYSTEROSCOPY W/D&C N/A 09/01/2017   Procedure: DILATATION AND CURETTAGE /HYSTEROSCOPY;  Surgeon: Vena AustriaStaebler, Rochell Mabie, MD;  Location: [facility];  Service: Gynecology;  Laterality: N/A;  . LAPAROSCOPY N/A 09/01/2017   Procedure: LAPAROSCOPY OPERATIVE with biopsy;  Surgeon: Vena AustriaStaebler, Wyndell Cardiff, MD;  Location: [facility];  Service: Gynecology;  Laterality: N/A;  . LAPAROSCOPY N/A 11/05/2017   Procedure: LAPAROSCOPY DIAGNOSTIC;  Surgeon: Vena AustriaStaebler, Quinita Kostelecky, MD;  Location: [facility];  Service: Gynecology;  Laterality: N/A;  . REPAIR VAGINAL CUFF N/A 11/05/2017   Procedure: REPAIR VAGINAL CUFF;  Surgeon: Vena AustriaStaebler, Kimiya Brunelle, MD;  Location: [facility];  Service: Gynecology;  Laterality: N/A;  . TOTAL LAPAROSCOPIC HYSTERECTOMY WITH SALPINGECTOMY Bilateral 09/29/2017   Procedure: TOTAL LAPAROSCOPIC HYSTERECTOMY WITH SALPINGECTOMY;  Surgeon: Vena AustriaStaebler, Verlie Liotta, MD;  Location: [facility];  Service: Gynecology;  Laterality: Bilateral;    Gynecologic History: Patient's last menstrual period was 08/22/2017.  Obstetric History: [id number]  Family History:  Family History  Problem Relation Age of Onset  . Hypertension Mother   . Alcohol abuse Mother   . Deep vein thrombosis Mother   . Alcohol abuse Father   . COPD Father   . Emphysema Father   . Heart disease Father     Social History:  Social History   Socioeconomic History  . Marital status: Single    Spouse name: Not on file  . Number of children: Not on file  . Years of education: Not  on file  . Highest education level: Not on file  Occupational History  . Not on file  Social Needs  . Financial resource strain: Not on file  . Food insecurity:    Worry: Not on file    Inability: Not on file  . Transportation needs:    Medical: Not on file    Non-medical: Not on file  Tobacco Use  . Smoking status: Current Every Day Smoker    Packs/day: 1.00    Years: 18.00    Pack years: 18.00    Types: Cigarettes  . Smokeless tobacco: Never Used  Substance and Sexual Activity  . Alcohol use: Yes    Alcohol/week: 0.0 standard drinks    Comment: daily the last 2 weeks  . Drug use: Yes    Types: Marijuana    Comment: last on 09/25/17  . Sexual activity: Yes    Partners: Male    Birth control/protection: Surgical    Comment: Hysterectomy  Lifestyle  . Physical activity:    Days per week: Not on file    Minutes per session: Not on file  . Stress: Not on file  Relationships  . Social connections:    Talks on phone: Not on file    Gets together: Not on file    Attends religious service: Not on file    Active member of club or organization: Not on file    Attends meetings of clubs or organizations: Not on file    Relationship status: Not on file  . Intimate partner violence:    Fear of current or ex partner: Not on file    Emotionally abused: Not on file    Physically abused: Not on file    Forced sexual activity: Not on file  Other Topics Concern  . Not on file  Social History Narrative  . Not on file    Allergies:  Allergies  Allergen Reactions  . Azithromycin Swelling and Other (See Comments)  . Meloxicam Other (See Comments)    Burns up from the inside   . Neurontin [Gabapentin] Other (See Comments)    Makes her pass out and not remember what happened prior to taking med.  . Oxycodone Nausea And Vomiting    dizziness  . Penicillins Anaphylaxis    Patient allergic to all "cillins" Has patient had a PCN reaction causing immediate rash,  facial/tongue/throat swelling, SOB or lightheadedness with hypotension: Yes Has patient had a PCN reaction causing severe rash involving mucus membranes or skin necrosis: Yes Has patient had a PCN reaction that required hospitalization: No Has patient had a PCN reaction occurring within the last 10 years: Yes If all of the above answers are "NO", then may proceed with Cephalosporin use.   . Clindamycin/Lincomycin Swelling  . Other Hives and Other (See Comments)    Berry flavored food and drinks  . Diphenhydramine Hcl Rash  . Doxycycline Rash    Medications: Prior to Admission medications  Medication Sig Start Date End Date Taking? Authorizing Provider  albuterol (PROVENTIL HFA;VENTOLIN HFA) 108 (90 Base) MCG/ACT inhaler Inhale 2 puffs into the lungs every 6 (six) hours as needed for wheezing or shortness of breath.  01/03/15  Yes [provider]  cetirizine (ZYRTEC) 10 MG tablet Take by mouth. 08/16/18 08/16/19 Yes [provider]  EPIPEN 2-PAK 0.3 MG/0.3ML SOAJ injection INJECT INTO THIGH MUSCLE THROUGH CLOTHES AS NEEDED SEVERE ALLERGIC REACTION 01/29/15  Yes [provider]  fluticasone (FLONASE) 50 MCG/ACT nasal spray 1 spray by Each Nare route daily. 07/28/18 07/28/19 Yes [provider]  methimazole (TAPAZOLE) 5 MG tablet Take by mouth. 08/31/18  Yes [provider]  Multiple Vitamins-Minerals (CENTRUM ADULTS PO) Take 1 tablet by mouth at bedtime.    Yes [provider]  naproxen (NAPROSYN) 500 MG tablet Take by mouth. 12/15/16  Yes [provider]  omeprazole (PRILOSEC) 20 MG capsule Take 20 mg by mouth at bedtime.  03/11/17  Yes [provider]    Physical Exam Vitals:  Vitals:   09/13/18 1114  BP: 130/80   Patient's last menstrual period was 08/22/2017.  General: NAD HEENT: normocephalic, anicteric Pulmonary: No increased work of breathing Abdomen: NABS, soft, non-tender, non-distended.  Umbilicus without  lesions.  No hepatomegaly, splenomegaly or masses palpable. No evidence of hernia  Genitourinary:  External: Normal external female genitalia.  Normal urethral meatus, normal  Bartholin's and Skene's glands.    Vagina: Normal vaginal mucosa, no evidence of prolapse.    Cervix: surgically absent  Uterus: surgically absent  Rectal: deferred  Lymphatic: no evidence of inguinal lymphadenopathy Extremities: no edema, erythema, or tenderness Neurologic: Grossly intact Skin: Abdomen/trunk has small  2-143mm purple, slightly raised lesions.  These have the appearance of being vascular, but are non-blanching. Psychiatric: mood appropriate, affect full  Female chaperone present for pelvic  portions of the physical exam  Wet Prep: PH: 4.5 Clue Cells: Negative Fungal elements: Negative Trichomonas: Negative   Assessment: 33 y.o. [id number] presenting with urinary frequency and requesting STI screening. Also new onset rash  Plan: Problem List Items Addressed This Visit    None    Visit Diagnoses    Routine screening for STI (sexually transmitted infection)    -  Primary   Relevant Orders   NuSwab Vaginitis Plus (VG+)   Acute vaginitis       Relevant Orders   NuSwab Vaginitis Plus (VG+)     1) Rash - dermatology referral.  Kaposi sarcoma is in the differential but deemed relatively unlikley given prior negative HIV testing.  2) Vaginal irritation - Negative wet mount  3) STI screening - Negative initial HIV in [place] per patient in February.  Will obtain repeat today.  4) A total of 15 minutes were spent in face-to-face contact with the patient during this encounter with over half of that time devoted to counseling and coordination of care.  5) Return in about 1 year (around 09/13/2019) for annual.   Vena AustriaAndreas Angeles Paolucci, MD, Merlinda FrederickFACOG [facility], [facility] 09/13/2018, 11:25 AM

## 2018-09-14 ENCOUNTER — Encounter: Payer: Self-pay | Admitting: *Deleted

## 2018-09-14 ENCOUNTER — Emergency Department
Admission: EM | Admit: 2018-09-14 | Discharge: 2018-09-14 | Disposition: A | Discharge status: Home / Self Care | Payer: Medicaid Other | Attending: Emergency Medicine | Admitting: Emergency Medicine

## 2018-09-14 ENCOUNTER — Other Ambulatory Visit: Payer: Self-pay

## 2018-09-14 ENCOUNTER — Emergency Department: Payer: Medicaid Other

## 2018-09-14 DIAGNOSIS — S90111A Contusion of right great toe without damage to nail, initial encounter: Secondary | ICD-10-CM | POA: Insufficient documentation

## 2018-09-14 DIAGNOSIS — S99912A Unspecified injury of left ankle, initial encounter: Secondary | ICD-10-CM | POA: Diagnosis present

## 2018-09-14 DIAGNOSIS — W172XXA Fall into hole, initial encounter: Secondary | ICD-10-CM | POA: Diagnosis not present

## 2018-09-14 DIAGNOSIS — F17210 Nicotine dependence, cigarettes, uncomplicated: Secondary | ICD-10-CM | POA: Diagnosis not present

## 2018-09-14 DIAGNOSIS — Y999 Unspecified external cause status: Secondary | ICD-10-CM | POA: Diagnosis not present

## 2018-09-14 DIAGNOSIS — Y939 Activity, unspecified: Secondary | ICD-10-CM | POA: Insufficient documentation

## 2018-09-14 DIAGNOSIS — Y929 Unspecified place or not applicable: Secondary | ICD-10-CM | POA: Diagnosis not present

## 2018-09-14 DIAGNOSIS — S93492A Sprain of other ligament of left ankle, initial encounter: Secondary | ICD-10-CM

## 2018-09-14 DIAGNOSIS — J45909 Unspecified asthma, uncomplicated: Secondary | ICD-10-CM | POA: Diagnosis not present

## 2018-09-14 DIAGNOSIS — S82892A Other fracture of left lower leg, initial encounter for closed fracture: Secondary | ICD-10-CM | POA: Insufficient documentation

## 2018-09-14 LAB — HEP, RPR, HIV PANEL
HIV Screen 4th Generation wRfx: NONREACTIVE
Hepatitis B Surface Ag: NEGATIVE
RPR Ser Ql: NONREACTIVE

## 2018-09-14 MED ORDER — HYDROCODONE-ACETAMINOPHEN 5-325 MG PO TABS: 1.0000 | ORAL_TABLET | Freq: Four times a day (QID) | ORAL | 0 refills | Status: AC | PRN

## 2018-09-14 MED ORDER — HYDROCODONE-ACETAMINOPHEN 5-325 MG PO TABS
1.0000 | ORAL_TABLET | Freq: Once | ORAL | Status: AC
  Administered 2018-09-14: 1 via ORAL
  Filled 2018-09-14: qty 1

## 2018-09-14 MED ORDER — NAPROXEN 500 MG PO TABS: 500.0000 mg | ORAL_TABLET | Freq: Two times a day (BID) | ORAL | 0 refills | Status: DC

## 2018-09-14 NOTE — ED Provider Notes (Signed)
[facility] Emergency Department Provider Note ____________________________________________  Time seen: Approximately 6:14 PM  I have reviewed the triage vital signs and the nursing notes.   HISTORY  Chief Complaint Ankle Pain    HPI Elizabeth Spencer is a 33 y.o. female who presents to the emergency department for evaluation and treatment of left ankle and foot pain after stepping in a hole last night.  She also has bruising to the right great toe.  No alleviating measures attempted prior to arrival previous ankle injuries.  Past Medical History:  Diagnosis Date  . Arthritis   . Asthma    WELL CONTROLLED  . Brain aneurysm 2007   NEUROLOGY NOTE DOES NOT MENTION ANEURYSM BUT PT STATES SHE DID NOT HAVE TO HAVE SURGERY  . Complication of anesthesia    FOR 1 C-SECTION PT WAS ITCHING AND VERY RED ON HER FACE  . Family history of adverse reaction to anesthesia    brother, neice and nephew got red in face with hives  . Gallstones   . GERD (gastroesophageal reflux disease)   . Headache    CHRONIC HEADACHES  . History of chronic cough    DRY  . Pneumonia   . PTSD (post-traumatic stress disorder)   . PTSD (post-traumatic stress disorder)   . Thyroid condition    PT WAS JUST TOLD ON 08-25-17 THAT SHE HAS A THYROID PROBLEM AND IS GOING TO F/U WITH ENDOCRINOLOGIST IN 2 WEEKS    Patient Active Problem List   Diagnosis Date Noted  . Vaginal cuff dehiscence 11/05/2017  . S/P laparoscopic hysterectomy 09/29/2017    Past Surgical History:  Procedure Laterality Date  . ABDOMINAL HYSTERECTOMY    . CESAREAN SECTION     x3  . CYSTOSCOPY N/A 09/29/2017   Procedure: CYSTOSCOPY;  Surgeon: Malachy Mood, MD;  Location: [facility] ORS;  Service: Gynecology;  Laterality: N/A;  . CYSTOSCOPY N/A 11/05/2017   Procedure: CYSTOSCOPY;  Surgeon: Malachy Mood, MD;  Location: [facility] ORS;  Service: Gynecology;  Laterality: N/A;  . HYSTEROSCOPY W/D&C N/A 09/01/2017   Procedure:  DILATATION AND CURETTAGE /HYSTEROSCOPY;  Surgeon: Malachy Mood, MD;  Location: [facility] ORS;  Service: Gynecology;  Laterality: N/A;  . LAPAROSCOPY N/A 09/01/2017   Procedure: LAPAROSCOPY OPERATIVE with biopsy;  Surgeon: Malachy Mood, MD;  Location: [facility] ORS;  Service: Gynecology;  Laterality: N/A;  . LAPAROSCOPY N/A 11/05/2017   Procedure: LAPAROSCOPY DIAGNOSTIC;  Surgeon: Malachy Mood, MD;  Location: [facility] OR[facility];  Service: Gynecology;  Laterality: N/A;  . REPAIR VAGINAL CUFF N/A 11/05/2017   Procedure: REPAIR VAGINAL CUFF;  Surgeon: Malachy Mood, MD;  Location: [facility] OR[facility];  Service: Gynecology;  Laterality: N/A;  . TOTAL LAPAROSCOPIC HYSTERECTOMY WITH SALPINGECTOMY Bilateral 09/29/2017   Procedure: TOTAL LAPAROSCOPIC HYSTERECTOMY WITH SALPINGECTOMY;  Surgeon: Malachy Mood, MD;  Location: [facility] ORS;  Service: Gynecology;  Laterality: Bilateral;    Prior to Admission medications   Medication Sig Start Date End Date Taking? Authorizing Provider  albuterol (PROVENTIL HFA;VENTOLIN HFA) 108 (90 Base) MCG/ACT inhaler Inhale 2 puffs into the lungs every 6 (six) hours as needed for wheezing or shortness of breath.  01/03/15   [provider]  cetirizine (ZYRTEC) 10 MG tablet Take by mouth. 08/16/18 08/16/19  [provider]  EPIPEN 2-PAK 0.3 MG/0.3ML SOAJ injection INJECT INTO THIGH MUSCLE THROUGH CLOTHES AS NEEDED SEVERE ALLERGIC REACTION 01/29/15   [provider]  fluticasone (FLONASE) 50 MCG/ACT nasal spray 1 spray by Each Nare route daily. 07/28/18 07/28/19  [provider]  HYDROcodone-acetaminophen (NORCO/VICODIN) 5-325 MG tablet Take 1 tablet by mouth every 6 (six) hours as needed for up to 3 days for severe pain. 09/14/18 09/17/18  Chinita Pesterriplett, Ferron Ishmael B, FNP  methimazole (TAPAZOLE) 5 MG tablet Take by mouth. 08/31/18   [provider]  Multiple Vitamins-Minerals (CENTRUM ADULTS PO) Take 1 tablet by mouth at bedtime.     [provider]  naproxen  (NAPROSYN) 500 MG tablet Take 1 tablet (500 mg total) by mouth 2 (two) times daily with a meal. 09/14/18   Bibi Economos B, FNP  omeprazole (PRILOSEC) 20 MG capsule Take 20 mg by mouth at bedtime.  03/11/17   [provider]    Allergies Azithromycin, Meloxicam, Neurontin [gabapentin], Penicillins, Clindamycin/lincomycin, Codeine, Other, Diphenhydramine hcl, and Doxycycline  Family History  Problem Relation Age of Onset  . Hypertension Mother   . Alcohol abuse Mother   . Deep vein thrombosis Mother   . Alcohol abuse Father   . COPD Father   . Emphysema Father   . Heart disease Father     Social History Social History   Tobacco Use  . Smoking status: Current Every Day Smoker    Packs/day: 1.00    Years: 18.00    Pack years: 18.00    Types: Cigarettes  . Smokeless tobacco: Never Used  Substance Use Topics  . Alcohol use: Yes    Alcohol/week: 0.0 standard drinks    Comment: occasionally  . Drug use: Yes    Types: Marijuana    Comment: last on 09/25/17    Review of Systems Constitutional: Negative for fever. Cardiovascular: Negative for chest pain. Respiratory: Negative for shortness of breath. Musculoskeletal: Positive for left foot and ankle pain.  Positive for right great toe pain. Skin: Positive for ecchymosis over the right great toe.  Positive for diffuse swelling over the left ankle and foot. Neurological: Negative for decrease in sensation  ____________________________________________   PHYSICAL EXAM:  VITAL SIGNS: ED Triage Vitals  Enc Vitals Group     BP 09/14/18 1709 115/72     Pulse Rate 09/14/18 1709 94     Resp 09/14/18 1709 18     Temp 09/14/18 1709 98.2 F (36.8 C)     Temp Source 09/14/18 1709 Oral     SpO2 09/14/18 1709 100 %     Weight 09/14/18 1708 206 lb (93.4 kg)     Height 09/14/18 1708 5\' 3"  (1.6 m)     Head Circumference --      Peak Flow --      Pain Score 09/14/18 1707 10     Pain Loc --      Pain Edu? --      Excl. in  GC? --     Constitutional: Alert and oriented. Well appearing and in no acute distress. Eyes: Conjunctivae are clear without discharge or drainage Head: Atraumatic Neck: Supple.  No focal midline tenderness. Respiratory: No cough. Respirations are even and unlabored. Musculoskeletal: Limited range of motion of the left ankle secondary to swelling and pain.  There is ATFL pattern swelling.  Full range of motion of the toes of the right and left foot.  No focal tenderness over the proximal fibula of the left knee. Neurologic: Motor and sensory function is intact. Skin: Mild ecchymosis noted over the right great toe.  There is no ecchymosis or open wound over the left foot or ankle. Psychiatric: Affect and behavior are appropriate.  ____________________________________________   LABS (all labs ordered are listed,  but only abnormal results are displayed)  Labs Reviewed - No data to display ____________________________________________  RADIOLOGY  Image of the left foot and ankle shows a tiny avulsion fracture of the lateral talar process and a small acute nondisplaced longitudinal fracture involving the lateral base of the first distal phalanx with extension into the first IP joint. ____________________________________________   PROCEDURES  .Ortho Injury Treatment  Date/Time: 09/16/2018 12:45 AM Performed by: Chinita Pesterriplett, Gilberta Peeters B, FNP Authorized by: Chinita Pesterriplett, Laurence Folz B, FNP   Consent:    Consent obtained:  Verbal   Consent given by:  Patient   Risks discussed:  StiffnessInjury location: foot Location details: left foot Injury type: fracture Fracture type: talar and first metatarsal Pre-procedure neurovascular assessment: neurovascularly intact Manipulation performed: no Splint type: ankle stirrup Post-procedure neurovascular assessment: post-procedure neurovascularly intact     ____________________________________________   INITIAL IMPRESSION / ASSESSMENT AND PLAN / ED  COURSE  Elizabeth Spencer is a 33 y.o. who presents to the emergency department for treatment and evaluation after stepping in a hole last night twisting her left ankle.  Images do show a very tiny avulsion fracture of the lateral talar process and a small nondisplaced longitudinal fracture of the first distal phalanx.  She was placed in an ankle stirrup splint and given crutches.  She is to follow-up with orthopedics or podiatry if her symptoms are not improving over the next few days.  She was encouraged to rest, ice, and elevate the foot and ankle for the next several days as often as possible.  She is to return to the emergency department for symptoms of change or worsen if she is unable to schedule an appointment with primary care or the specialist.   Medications  HYDROcodone-acetaminophen (NORCO/VICODIN) 5-325 MG per tablet 1 tablet (1 tablet Oral Given 09/14/18 1817)    Pertinent labs & imaging results that were available during my care of the patient were reviewed by me and considered in my medical decision making (see chart for details).  _________________________________________   FINAL CLINICAL IMPRESSION(S) / ED DIAGNOSES  Final diagnoses:  Sprain of anterior talofibular ligament of left ankle, initial encounter  Avulsion fracture of ankle, left, closed, initial encounter    ED Discharge Orders         Ordered    HYDROcodone-acetaminophen (NORCO/VICODIN) 5-325 MG tablet  Every 6 hours PRN     09/14/18 1835    naproxen (NAPROSYN) 500 MG tablet  2 times daily with meals     09/14/18 1835           If controlled substance prescribed during this visit, 12 month history viewed on the [facility] prior to issuing an initial prescription for Schedule II or III opiod.   Chinita Pesterriplett, Kenasia Scheller B, FNP 09/16/18 [id number]    Jeanmarie PlantMcShane, James A, MD 09/20/18 [id number]

## 2018-09-14 NOTE — ED Triage Notes (Signed)
Patient fell, has swollen left ankle and pain right toe.

## 2018-09-14 NOTE — ED Notes (Signed)
See triage note  States she twisted left ankle yesterday  Ankle is swollen  Good pulses   Also has bruising and pain to right foot/great toe

## 2018-09-14 NOTE — Discharge Instructions (Signed)
Follow up with podiatry if not improving over the week.  Return to the ER for symptoms that change or worsen if unable to schedule an appointment.

## 2018-09-15 LAB — URINE CULTURE

## 2018-09-18 ENCOUNTER — Emergency Department
Admission: EM | Admit: 2018-09-18 | Discharge: 2018-09-19 | Disposition: A | Discharge status: Home / Self Care | Payer: Medicaid Other | Attending: Emergency Medicine | Admitting: Emergency Medicine

## 2018-09-18 ENCOUNTER — Encounter: Payer: Self-pay | Admitting: Emergency Medicine

## 2018-09-18 ENCOUNTER — Other Ambulatory Visit: Payer: Self-pay

## 2018-09-18 ENCOUNTER — Emergency Department: Payer: Medicaid Other

## 2018-09-18 DIAGNOSIS — F17210 Nicotine dependence, cigarettes, uncomplicated: Secondary | ICD-10-CM | POA: Insufficient documentation

## 2018-09-18 DIAGNOSIS — J029 Acute pharyngitis, unspecified: Secondary | ICD-10-CM | POA: Diagnosis present

## 2018-09-18 DIAGNOSIS — J189 Pneumonia, unspecified organism: Secondary | ICD-10-CM | POA: Insufficient documentation

## 2018-09-18 DIAGNOSIS — Z880 Allergy status to penicillin: Secondary | ICD-10-CM | POA: Insufficient documentation

## 2018-09-18 DIAGNOSIS — J45909 Unspecified asthma, uncomplicated: Secondary | ICD-10-CM | POA: Insufficient documentation

## 2018-09-18 DIAGNOSIS — Z79899 Other long term (current) drug therapy: Secondary | ICD-10-CM | POA: Insufficient documentation

## 2018-09-18 DIAGNOSIS — Z20828 Contact with and (suspected) exposure to other viral communicable diseases: Secondary | ICD-10-CM | POA: Diagnosis not present

## 2018-09-18 LAB — SARS CORONAVIRUS 2 BY RT PCR (HOSPITAL ORDER, PERFORMED IN ~~LOC~~ HOSPITAL LAB): SARS Coronavirus 2: NEGATIVE

## 2018-09-18 MED ORDER — ACETAMINOPHEN 500 MG PO TABS
1000.0000 mg | ORAL_TABLET | Freq: Once | ORAL | Status: AC
  Administered 2018-09-18: 1000 mg via ORAL
  Filled 2018-09-18: qty 2

## 2018-09-18 NOTE — ED Triage Notes (Signed)
Pt in via POV, reports sore throat, cough, shortness of breath x 3 days.  Pt reports unable to swallow, states, "It feels like Im swallowing a rock."  Pt with hx asthma.  Tachypnea noted upon arrival.

## 2018-09-19 ENCOUNTER — Ambulatory Visit: Payer: Medicaid Other | Admitting: Obstetrics and Gynecology

## 2018-09-19 MED ORDER — IPRATROPIUM-ALBUTEROL 0.5-2.5 (3) MG/3ML IN SOLN
3.0000 mL | Freq: Once | RESPIRATORY_TRACT | Status: AC
  Administered 2018-09-19: 3 mL via RESPIRATORY_TRACT
  Filled 2018-09-19: qty 3

## 2018-09-19 MED ORDER — PREDNISONE 20 MG PO TABS
60.0000 mg | ORAL_TABLET | Freq: Once | ORAL | Status: AC
  Administered 2018-09-19: 60 mg via ORAL
  Filled 2018-09-19: qty 3

## 2018-09-19 MED ORDER — LEVOFLOXACIN 500 MG PO TABS
500.0000 mg | ORAL_TABLET | Freq: Every day | ORAL | 0 refills | Status: AC
Start: 2018-09-19 — End: 2018-09-25

## 2018-09-19 MED ORDER — ALBUTEROL SULFATE HFA 108 (90 BASE) MCG/ACT IN AERS: 2.0000 | INHALATION_SPRAY | Freq: Four times a day (QID) | RESPIRATORY_TRACT | 0 refills | Status: DC | PRN

## 2018-09-19 MED ORDER — LEVOFLOXACIN 500 MG PO TABS
500.0000 mg | ORAL_TABLET | Freq: Once | ORAL | Status: AC
  Administered 2018-09-19: 500 mg via ORAL
  Filled 2018-09-19: qty 1

## 2018-09-19 NOTE — ED Notes (Signed)
EDP to bedside. 

## 2018-09-19 NOTE — ED Provider Notes (Signed)
[facility] Emergency Department Provider Note __   First MD Initiated Contact with Patient 09/19/18 0031     (approximate)  I have reviewed the triage vital signs and the nursing notes.   HISTORY  Chief Complaint URI   HPI Elizabeth Spencer is a 33 y.o. female with history of asthma with below list of previous medical conditions presents to the emergency department with a 3-day history of sore throat cough dyspnea and subjective fever.  Patient denies any known sick contact         Past Medical History:  Diagnosis Date  . Arthritis   . Asthma    WELL CONTROLLED  . Brain aneurysm 2007   NEUROLOGY NOTE DOES NOT MENTION ANEURYSM BUT PT STATES SHE DID NOT HAVE TO HAVE SURGERY  . Complication of anesthesia    FOR 1 C-SECTION PT WAS ITCHING AND VERY RED ON HER FACE  . Family history of adverse reaction to anesthesia    brother, neice and nephew got red in face with hives  . Gallstones   . GERD (gastroesophageal reflux disease)   . Headache    CHRONIC HEADACHES  . History of chronic cough    DRY  . Pneumonia   . PTSD (post-traumatic stress disorder)   . PTSD (post-traumatic stress disorder)   . Thyroid condition    PT WAS JUST TOLD ON 08-25-17 THAT SHE HAS A THYROID PROBLEM AND IS GOING TO F/U WITH ENDOCRINOLOGIST IN 2 WEEKS    Patient Active Problem List   Diagnosis Date Noted  . Vaginal cuff dehiscence 11/05/2017  . S/P laparoscopic hysterectomy 09/29/2017    Past Surgical History:  Procedure Laterality Date  . ABDOMINAL HYSTERECTOMY    . CESAREAN SECTION     x3  . CYSTOSCOPY N/A 09/29/2017   Procedure: CYSTOSCOPY;  Surgeon: Malachy Mood, MD;  Location: [facility] ORS;  Service: Gynecology;  Laterality: N/A;  . CYSTOSCOPY N/A 11/05/2017   Procedure: CYSTOSCOPY;  Surgeon: Malachy Mood, MD;  Location: [facility] OR[facility];  Service: Gynecology;  Laterality: N/A;  . HYSTEROSCOPY W/D&C N/A 09/01/2017   Procedure: DILATATION AND CURETTAGE /HYSTEROSCOPY;   Surgeon: Malachy Mood, MD;  Location: [facility] OR[facility];  Service: Gynecology;  Laterality: N/A;  . LAPAROSCOPY N/A 09/01/2017   Procedure: LAPAROSCOPY OPERATIVE with biopsy;  Surgeon: Malachy Mood, MD;  Location: [facility] OR[facility];  Service: Gynecology;  Laterality: N/A;  . LAPAROSCOPY N/A 11/05/2017   Procedure: LAPAROSCOPY DIAGNOSTIC;  Surgeon: Malachy Mood, MD;  Location: [facility];  Service: Gynecology;  Laterality: N/A;  . REPAIR VAGINAL CUFF N/A 11/05/2017   Procedure: REPAIR VAGINAL CUFF;  Surgeon: Malachy Mood, MD;  Location: [facility];  Service: Gynecology;  Laterality: N/A;  . TOTAL LAPAROSCOPIC HYSTERECTOMY WITH SALPINGECTOMY Bilateral 09/29/2017   Procedure: TOTAL LAPAROSCOPIC HYSTERECTOMY WITH SALPINGECTOMY;  Surgeon: Malachy Mood, MD;  Location: [facility];  Service: Gynecology;  Laterality: Bilateral;    Prior to Admission medications   Medication Sig Start Date End Date Taking? Authorizing Provider  albuterol (PROVENTIL HFA;VENTOLIN HFA) 108 (90 Base) MCG/ACT inhaler Inhale 2 puffs into the lungs every 6 (six) hours as needed for wheezing or shortness of breath.  01/03/15   [provider]  cetirizine (ZYRTEC) 10 MG tablet Take by mouth. 08/16/18 08/16/19  [provider]  EPIPEN 2-PAK 0.3 MG/0.3ML SOAJ injection INJECT INTO THIGH MUSCLE THROUGH CLOTHES AS NEEDED SEVERE ALLERGIC REACTION 01/29/15   [provider]  fluticasone (FLONASE) 50 MCG/ACT nasal spray 1 spray by Each Nare route daily.  07/28/18 07/28/19  [provider]  methimazole (TAPAZOLE) 5 MG tablet Take by mouth. 08/31/18   [provider]  Multiple Vitamins-Minerals (CENTRUM ADULTS PO) Take 1 tablet by mouth at bedtime.     [provider]  naproxen (NAPROSYN) 500 MG tablet Take 1 tablet (500 mg total) by mouth 2 (two) times daily with a meal. 09/14/18   Triplett, Cari B, FNP  omeprazole (PRILOSEC) 20 MG capsule Take 20 mg by mouth at bedtime.  03/11/17   [provider]    Allergies Azithromycin, Meloxicam, Neurontin [gabapentin], Penicillins, Clindamycin/lincomycin, Codeine, Other, Diphenhydramine hcl, and Doxycycline  Family History  Problem Relation Age of Onset  . Hypertension Mother   . Alcohol abuse Mother   . Deep vein thrombosis Mother   . Alcohol abuse Father   . COPD Father   . Emphysema Father   . Heart disease Father     Social History Social History   Tobacco Use  . Smoking status: Current Every Day Smoker    Packs/day: 0.50    Years: 18.00    Pack years: 9.00    Types: Cigarettes  . Smokeless tobacco: Never Used  Substance Use Topics  . Alcohol use: Yes    Alcohol/week: 0.0 standard drinks    Comment: occasionally  . Drug use: Yes    Types: Marijuana    Review of Systems Constitutional: Stated for fever Eyes: No visual changes. ENT: No sore throat. Cardiovascular: Denies chest pain. Respiratory: Positive for dyspnea and cough Gastrointestinal: No abdominal pain.  No nausea, no vomiting.  No diarrhea.  No constipation. Genitourinary: Negative for dysuria. Musculoskeletal: Negative for neck pain.  Negative for back pain. Integumentary: Negative for rash. Neurological: Negative for headaches, focal weakness or numbness. }  ____________________________________________   PHYSICAL EXAM:  VITAL SIGNS: ED Triage Vitals  Enc Vitals Group     BP 09/18/18 2134 120/77     Pulse Rate 09/18/18 2134 (!) 124     Resp 09/18/18 2134 (!) 24     Temp 09/18/18 2134 99.6 F (37.6 C)     Temp Source 09/18/18 2134 Oral     SpO2 09/18/18 2134 98 %     Weight 09/18/18 2135 91.2 kg (201 lb)     Height 09/18/18 2135 1.702 m (5\' 7" )     Head Circumference --      Peak Flow --      Pain Score 09/18/18 2135 8     Pain Loc --      Pain Edu? --      Excl. in GC? --     Constitutional: Alert and oriented. Well appearing and in no acute distress. Eyes: Conjunctivae are normal. Mouth/Throat: Mucous membranes are  moist.  Oropharynx non-erythematous. Neck: No stridor.  Cardiovascular: Normal rate, regular rhythm. Good peripheral circulation. Grossly normal heart sounds. Respiratory: Normal respiratory effort.  No retractions. No audible wheezing. Gastrointestinal: Soft and nontender. No distention. :  Musculoskeletal: No lower extremity tenderness nor edema. No gross deformities of extremities. Neurologic:  Normal speech and language. No gross focal neurologic deficits are appreciated.  Skin:  Skin is warm, dry and intact. No rash noted. Psychiatric: Mood and affect are normal. Speech and behavior are normal.  ____________________________________________   LABS (all labs ordered are listed, but only abnormal results are displayed)  Labs Reviewed  SARS CORONAVIRUS 2 (HOSPITAL ORDER, PERFORMED IN [facility])   ____________________________________________  RADIOLOGY I, Darci CurrentANDOLPH N Shlonda Dolloff, personally viewed and evaluated these  images (plain radiographs) as part of my medical decision making, as well as reviewing the written report by the radiologist.  ED MD interpretation: Basilar heterogeneous opacities per radiologist on chest x-ray interpretation  Official radiology report(s): Dg Chest Portable 1 View  Result Date: 09/18/2018 CLINICAL DATA:  Cough and shortness of breath. EXAM: PORTABLE CHEST 1 VIEW COMPARISON:  None. FINDINGS: Low lung volumes. Heterogeneous bibasilar opacities. Heart is normal in size. Normal mediastinal contours. No pleural effusion or pneumothorax. IMPRESSION: Low lung volumes with heterogeneous bibasilar opacities. Differential considerations include atelectasis or pneumonia, including atypical viral infection. Electronically Signed   By: Narda RutherfordMelanie  Sanford M.D.   On: 09/18/2018 22:47     Procedures   ____________________________________________   INITIAL IMPRESSION / MDM / ASSESSMENT AND PLAN / ED COURSE  As part of my medical decision making, I reviewed  the following data within the electronic MEDICAL RECORD NUMBER  33 year old female presented with above-stated history and physical exam concern for possible pneumonia bronchitis and COVID-19 infection.  COVID-19 swab negative however considered possibly of a false negative and as such advised patient to home quarantine.  Chest x-ray findings consistent with possible pneumonia and as such patient given Levaquin as she is allergic to azithromycin doxycycline and penicillins.  Advised the patient of warning signs I would warrant immediate return to the emergency department.  *[name] was evaluated in Emergency Department on 09/19/2018 for the symptoms described in the history of present illness. She was evaluated in the context of the global COVID-19 pandemic, which necessitated consideration that the patient might be at risk for infection with the SARS-CoV-2 virus that causes COVID-19. Institutional protocols and algorithms that pertain to the evaluation of patients at risk for COVID-19 are in a state of rapid change based on information released by regulatory bodies including the CDC and federal and state organizations. These policies and algorithms were followed during the patient's care in the ED.  Some ED evaluations and interventions may be delayed as a result of limited staffing during the pandemic.*    ____________________________________________  FINAL CLINICAL IMPRESSION(S) / ED DIAGNOSES  Final diagnoses:  Community acquired pneumonia, unspecified laterality     MEDICATIONS GIVEN DURING THIS VISIT:  Medications  acetaminophen (TYLENOL) tablet 1,000 mg (1,000 mg Oral Given 09/18/18 2253)  ipratropium-albuterol (DUONEB) 0.5-2.5 (3) MG/3ML nebulizer solution 3 mL (3 mLs Nebulization Given 09/19/18 0034)  predniSONE (DELTASONE) tablet 60 mg (60 mg Oral Given 09/19/18 0035)  levofloxacin (LEVAQUIN) tablet 500 mg (500 mg Oral Given 09/19/18 0035)     ED Discharge Orders    None        Note:  This document was prepared using Dragon voice recognition software and may include unintentional dictation errors.   Darci CurrentBrown, Cameron N, MD 09/19/18 [phone]

## 2018-09-20 LAB — NUSWAB VAGINITIS PLUS (VG+)
Atopobium vaginae: HIGH Score — AB
BVAB 2: HIGH Score — AB
Candida albicans, NAA: NEGATIVE
Candida glabrata, NAA: NEGATIVE
Chlamydia trachomatis, NAA: NEGATIVE
Megasphaera 1: HIGH Score — AB
Neisseria gonorrhoeae, NAA: NEGATIVE
Trich vag by NAA: POSITIVE — AB

## 2018-09-21 ENCOUNTER — Other Ambulatory Visit: Payer: Self-pay | Admitting: Obstetrics and Gynecology

## 2018-09-21 MED ORDER — METRONIDAZOLE 500 MG PO TABS: 500.0000 mg | ORAL_TABLET | Freq: Two times a day (BID) | ORAL | 0 refills | Status: DC

## 2018-09-27 NOTE — Telephone Encounter (Signed)
Pt calling; trying to figure out what AMS is treating her for c medication; cannot access MyChart.  [phone]  Pt aware flagyl is for both BV and Trich.
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
## 2018-10-05 ENCOUNTER — Telehealth: Payer: Self-pay

## 2018-10-05 ENCOUNTER — Other Ambulatory Visit: Payer: Self-pay | Admitting: Obstetrics and Gynecology

## 2018-10-05 MED ORDER — METRONIDAZOLE 500 MG PO TABS: 500.0000 mg | ORAL_TABLET | Freq: Two times a day (BID) | ORAL | 0 refills | Status: AC

## 2018-10-05 MED ORDER — FLUCONAZOLE 150 MG PO TABS: 150.0000 mg | ORAL_TABLET | Freq: Once | ORAL | 0 refills | Status: AC

## 2018-10-05 NOTE — Telephone Encounter (Signed)
Pt called; is at pharmacy; is VERY upset that we didn't tell her she had a positive test that needed to be tx'd; her it is a week and a half later; she just happened to call pharm and they told her she had a rx ready; her ins will not cover it; is there something else she can take instead that m'caid will cover?  Her sxs are d/c, itching, abd pain, burning c urination.  Is this all from the trich?  Please call pt's phone as she doesn't know how to work Pharmacist, community.  [phone]

## 2018-10-05 NOTE — Telephone Encounter (Signed)
Dr. Georgianne Fick informed pt of the results through [place] in June when results were available. We received a message today pt had not read that message. I sent another message this morning informing her of Dr. Iva Boop message. Her MyChart is activated so therefore we are not aware when patient can not get access to it.  Message sent to Dr. Georgianne Fick  For him to call pt.

## 2019-04-12 ENCOUNTER — Ambulatory Visit: Payer: Medicaid Other | Admitting: Obstetrics and Gynecology

## 2019-04-27 ENCOUNTER — Ambulatory Visit: Payer: Medicaid Other | Admitting: Obstetrics and Gynecology

## 2019-05-11 ENCOUNTER — Ambulatory Visit: Payer: Self-pay | Admitting: Obstetrics and Gynecology

## 2019-06-24 ENCOUNTER — Emergency Department: Payer: Medicaid Other

## 2019-06-24 ENCOUNTER — Other Ambulatory Visit: Payer: Self-pay

## 2019-06-24 ENCOUNTER — Emergency Department
Admission: EM | Admit: 2019-06-24 | Discharge: 2019-06-24 | Disposition: A | Discharge status: Home / Self Care | Payer: Medicaid Other | Attending: Emergency Medicine | Admitting: Emergency Medicine

## 2019-06-24 DIAGNOSIS — F17210 Nicotine dependence, cigarettes, uncomplicated: Secondary | ICD-10-CM | POA: Insufficient documentation

## 2019-06-24 DIAGNOSIS — R6884 Jaw pain: Secondary | ICD-10-CM | POA: Diagnosis present

## 2019-06-24 DIAGNOSIS — K047 Periapical abscess without sinus: Secondary | ICD-10-CM

## 2019-06-24 DIAGNOSIS — J45909 Unspecified asthma, uncomplicated: Secondary | ICD-10-CM | POA: Insufficient documentation

## 2019-06-24 DIAGNOSIS — Z79899 Other long term (current) drug therapy: Secondary | ICD-10-CM | POA: Insufficient documentation

## 2019-06-24 LAB — BASIC METABOLIC PANEL
Anion gap: 12 (ref 5–15)
BUN: 9 mg/dL (ref 6–20)
CO2: 20 mmol/L — ABNORMAL LOW (ref 22–32)
Calcium: 9.5 mg/dL (ref 8.9–10.3)
Chloride: 106 mmol/L (ref 98–111)
Creatinine, Ser: 0.56 mg/dL (ref 0.44–1.00)
GFR calc Af Amer: >60 mL/min (ref >60)
GFR calc non Af Amer: >60 mL/min (ref >60)
Glucose, Bld: 95 mg/dL (ref 70–99)
Potassium: 4.4 mmol/L (ref 3.5–5.1)
Sodium: 138 mmol/L (ref 135–145)

## 2019-06-24 LAB — CBC WITH DIFFERENTIAL/PLATELET
Abs Immature Granulocytes: 0.06 10*3/uL (ref 0.00–0.07)
Basophils Absolute: 0.1 10*3/uL (ref 0.0–0.1)
Basophils Relative: 1 %
Eosinophils Absolute: 0.3 10*3/uL (ref 0.0–0.5)
Eosinophils Relative: 2 %
HCT: 46.5 % — ABNORMAL HIGH (ref 36.0–46.0)
Hemoglobin: 15.3 g/dL — ABNORMAL HIGH (ref 12.0–15.0)
Immature Granulocytes: 0 %
Lymphocytes Relative: 18 %
Lymphs Abs: 3.1 10*3/uL (ref 0.7–4.0)
MCH: 30.2 pg (ref 26.0–34.0)
MCHC: 32.9 g/dL (ref 30.0–36.0)
MCV: 91.7 fL (ref 80.0–100.0)
Monocytes Absolute: 1.2 10*3/uL — ABNORMAL HIGH (ref 0.1–1.0)
Monocytes Relative: 7 %
Neutro Abs: 12.2 10*3/uL — ABNORMAL HIGH (ref 1.7–7.7)
Neutrophils Relative %: 72 %
Platelets: 336 10*3/uL (ref 150–400)
RBC: 5.07 MIL/uL (ref 3.87–5.11)
RDW: 12.9 % (ref 11.5–15.5)
WBC: 16.9 10*3/uL — ABNORMAL HIGH (ref 4.0–10.5)
nRBC: 0 % (ref 0.0–0.2)

## 2019-06-24 LAB — LACTIC ACID, PLASMA: Lactic Acid, Venous: 0.7 mmol/L (ref 0.5–1.9)

## 2019-06-24 MED ORDER — KETOROLAC TROMETHAMINE 30 MG/ML IJ SOLN
30.0000 mg | Freq: Once | INTRAMUSCULAR | Status: AC
  Administered 2019-06-24: 30 mg via INTRAVENOUS
  Filled 2019-06-24: qty 1

## 2019-06-24 MED ORDER — IOHEXOL 300 MG/ML  SOLN
75.0000 mL | Freq: Once | INTRAMUSCULAR | Status: AC | PRN
  Administered 2019-06-24: 75 mL via INTRAVENOUS
  Filled 2019-06-24: qty 75

## 2019-06-24 MED ORDER — LIDOCAINE-EPINEPHRINE 2 %-1:100000 IJ SOLN
1.7000 mL | Freq: Once | INTRAMUSCULAR | Status: AC
  Administered 2019-06-24: 1.7 mL
  Filled 2019-06-24: qty 1.7

## 2019-06-24 MED ORDER — ONDANSETRON HCL 4 MG/2ML IJ SOLN
4.0000 mg | Freq: Once | INTRAMUSCULAR | Status: AC
  Administered 2019-06-24: 4 mg via INTRAVENOUS
  Filled 2019-06-24: qty 2

## 2019-06-24 MED ORDER — VANCOMYCIN HCL IN DEXTROSE 1-5 GM/200ML-% IV SOLN
1000.0000 mg | Freq: Once | INTRAVENOUS | Status: AC
  Administered 2019-06-24: 1000 mg via INTRAVENOUS
  Filled 2019-06-24: qty 200

## 2019-06-24 MED ORDER — NAPROXEN 500 MG PO TABS: 500.0000 mg | ORAL_TABLET | Freq: Two times a day (BID) | ORAL | 0 refills | Status: AC

## 2019-06-24 MED ORDER — ERYTHROMYCIN BASE 500 MG PO TABS: 500.0000 mg | ORAL_TABLET | Freq: Four times a day (QID) | ORAL | 0 refills | Status: AC

## 2019-06-24 MED ORDER — MORPHINE SULFATE (PF) 2 MG/ML IV SOLN
2.0000 mg | Freq: Once | INTRAVENOUS | Status: AC
  Administered 2019-06-24: 2 mg via INTRAVENOUS
  Filled 2019-06-24: qty 1

## 2019-06-24 MED ORDER — KETOROLAC TROMETHAMINE 10 MG PO TABS: 10.0000 mg | ORAL_TABLET | Freq: Three times a day (TID) | ORAL | 0 refills | Status: DC

## 2019-06-24 NOTE — Discharge Instructions (Addendum)
You are being treated for a dental abscess. Take the prescription antibiotic and anti-inflammatory as directed. Apply warm compresses to the face. Rinse with warm-salty water after every meal. Follow-up with your dental provider for extraction, as planned. Return to the ED as needed.

## 2019-06-24 NOTE — ED Notes (Signed)
Pt called this RN to the room. Pt noticed blood in her spit. Pt states "is this normal, because if this is not normal you all are responsible for this." Very scant amount of blood noted on the pt's paper towel. This RN informed, if there is an abscess in her mouth, it is very possible it can bleed. Pt states, "I need someone who can answer my questions in here." [place], PA made aware and at bedside.
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
## 2019-06-28 ENCOUNTER — Telehealth: Payer: Self-pay

## 2019-06-28 ENCOUNTER — Other Ambulatory Visit: Payer: Self-pay | Admitting: Obstetrics and Gynecology

## 2019-06-28 MED ORDER — FLUCONAZOLE 150 MG PO TABS: 150.0000 mg | ORAL_TABLET | Freq: Once | ORAL | 0 refills | Status: AC

## 2019-06-28 NOTE — Telephone Encounter (Signed)
Rx for diflucan called in

## 2019-06-28 NOTE — Telephone Encounter (Signed)
Pt calling stating she has been on a strong antibiotic for 7 days and has begun to have the symptoms of a yeast infection, white, clumpy discharge, and itching pt still have 7 more days left of the antibiotic and wanted to know if the yeast infection would come back if she took something now for it. Pt advised that AMS is not in office today but would be back tomorrow and pt stated that would be okay to wait for him.

## 2019-06-28 NOTE — Telephone Encounter (Signed)
Pt aware.

## 2019-06-29 LAB — CULTURE, BLOOD (SINGLE)
Culture: NO GROWTH
Special Requests: ADEQUATE

## 2019-07-03 ENCOUNTER — Telehealth: Payer: Self-pay

## 2019-07-03 NOTE — Telephone Encounter (Signed)
Pt calling to fine out where AMS called her med to.  [phone]

## 2019-07-03 NOTE — Telephone Encounter (Signed)
Please advise. Diflucan was supposed to be called in.

## 2019-07-04 ENCOUNTER — Other Ambulatory Visit: Payer: Self-pay | Admitting: Obstetrics and Gynecology

## 2019-07-04 MED ORDER — FLUCONAZOLE 150 MG PO TABS: 150.0000 mg | ORAL_TABLET | Freq: Once | ORAL | 0 refills | Status: AC

## 2019-07-04 NOTE — Telephone Encounter (Signed)
It was called in on 3/35/21 at 3:27PM [facility] [address] [facility] in [place] with the pharmacy having sent confirmation the the e-prescription went through.  It's not showing up since it was a one time treatment that should have been completed by now so it drops of the current med list. Please verify she went to the correct pharmacy.

## 2019-07-04 NOTE — Telephone Encounter (Signed)
Pt states [facility]'t [facility]. I updated pharmacy

## 2019-07-04 NOTE — Telephone Encounter (Signed)
Has been resent

## 2019-08-02 ENCOUNTER — Ambulatory Visit: Payer: Medicaid Other | Admitting: Obstetrics and Gynecology

## 2019-11-01 ENCOUNTER — Ambulatory Visit: Payer: Medicaid Other | Admitting: Obstetrics and Gynecology

## 2019-11-26 ENCOUNTER — Ambulatory Visit: Payer: Medicaid Other | Admitting: Obstetrics and Gynecology

## 2019-12-18 ENCOUNTER — Telehealth: Payer: Self-pay

## 2019-12-18 NOTE — Telephone Encounter (Signed)
Patient states she forgot she can't douche and has gotten a yeast infection. Requesting rx. She had to r/s her 12/20/19 AE to October and can't wait that long for rx for YI. Cb#[phone]

## 2019-12-18 NOTE — Telephone Encounter (Signed)
Spoke w/patient. She has moved to [facility] and has to use transportation to get to her apts. They require a 5 day notice. Advised can use OTC hydrocortisone cream externally for itching relief. Can try OTC Monistat or store brand 7 day if she believes that it is a yeast infection. She reports she probably scratched in her sleep d/t itching and that's likely why she's bleeding. She will call back if OTC treatment doesn't help or new s&s develop.

## 2019-12-18 NOTE — Telephone Encounter (Signed)
Probably need to get her in for a visit bleeding doesn't sound like a yeast infection

## 2019-12-18 NOTE — Telephone Encounter (Signed)
Spoke w/patient, She states she douched 3 days ago. She reports she is raw/bleeding on the outside and itching. Currently no vaginal discharge. Minimal internal/vaginal symptoms yet. Verified pharmacy on file is correct.

## 2019-12-20 ENCOUNTER — Ambulatory Visit: Payer: Medicaid Other | Admitting: Obstetrics and Gynecology

## 2019-12-27 DIAGNOSIS — K219 Gastro-esophageal reflux disease without esophagitis: Secondary | ICD-10-CM | POA: Insufficient documentation

## 2019-12-27 DIAGNOSIS — D72829 Elevated white blood cell count, unspecified: Secondary | ICD-10-CM | POA: Insufficient documentation

## 2020-01-05 ENCOUNTER — Ambulatory Visit
Admission: EM | Admit: 2020-01-05 | Discharge: 2020-01-05 | Disposition: A | Discharge status: Home / Self Care | Payer: Medicaid Other | Attending: Internal Medicine | Admitting: Internal Medicine

## 2020-01-05 ENCOUNTER — Other Ambulatory Visit: Payer: Self-pay

## 2020-01-05 DIAGNOSIS — M791 Myalgia, unspecified site: Secondary | ICD-10-CM | POA: Diagnosis not present

## 2020-01-05 DIAGNOSIS — K219 Gastro-esophageal reflux disease without esophagitis: Secondary | ICD-10-CM | POA: Diagnosis not present

## 2020-01-05 DIAGNOSIS — J45909 Unspecified asthma, uncomplicated: Secondary | ICD-10-CM | POA: Insufficient documentation

## 2020-01-05 DIAGNOSIS — B349 Viral infection, unspecified: Secondary | ICD-10-CM

## 2020-01-05 DIAGNOSIS — R059 Cough, unspecified: Secondary | ICD-10-CM | POA: Insufficient documentation

## 2020-01-05 DIAGNOSIS — R0982 Postnasal drip: Secondary | ICD-10-CM | POA: Diagnosis not present

## 2020-01-05 DIAGNOSIS — F17210 Nicotine dependence, cigarettes, uncomplicated: Secondary | ICD-10-CM | POA: Insufficient documentation

## 2020-01-05 DIAGNOSIS — H9202 Otalgia, left ear: Secondary | ICD-10-CM | POA: Diagnosis not present

## 2020-01-05 DIAGNOSIS — Z20822 Contact with and (suspected) exposure to covid-19: Secondary | ICD-10-CM | POA: Diagnosis not present

## 2020-01-05 DIAGNOSIS — M26622 Arthralgia of left temporomandibular joint: Secondary | ICD-10-CM

## 2020-01-05 DIAGNOSIS — M1990 Unspecified osteoarthritis, unspecified site: Secondary | ICD-10-CM | POA: Insufficient documentation

## 2020-01-05 DIAGNOSIS — Z79899 Other long term (current) drug therapy: Secondary | ICD-10-CM | POA: Insufficient documentation

## 2020-01-05 NOTE — Discharge Instructions (Addendum)
If your Covid test ends up positive you may take the following supplements to help your immune system be stronger to fight this viral infection Take Quarcetin 500 mg three times a day x 7 days with Zinc 50 mg ones a day x 7 days. The quarcetin is an antiviral and anti-inflammatory supplement which helps open the zinc channels in the cell to absorb Zinc. Zinc helps decrease the virus load in your body. Take Melatonin 6-10 mg at bed time which also helps support your immune system.  Also make sure to take Vit D 5,000 IU per day with a fatty meal and Vit C 1000 mg a day until you are completely better. Stay on Vitamin D 2,000  and C the rest of the season.  Don't lay around, keep active and walk as much as you are able to to prevent worsening of your symptoms.  Follow up with your family Dr next week.  If you get short of breath and you are able to check  your oxygen with a pulse oxygen meter, if it gets to 92% or less, you need to go to the hospital to be admitted. If you dont have one, come back here and we will assess you.  Due to your chronic conditions and if your covid test is positive, you qualify to receive Monoclonal Antibody infusion and if you don't hear from anyone within 24 hours of finding your results, call [phone]  

## 2020-01-05 NOTE — ED Provider Notes (Signed)
[facility]    CSN: [id number] Arrival date & time: 01/05/20  1308      History   Chief Complaint Chief Complaint  Patient presents with  . Covid Exposure    HPI Elizabeth Spencer is a 34 y.o. female who presents with onset of cough and body aches x 2 days. Was exposed to someone with Covid last week.  She also had L ear pain. Saw her PCP last week and was placed on gtts for canal infection, but she still has pain. Denies having a fever.     Past Medical History:  Diagnosis Date  . Arthritis   . Asthma    WELL CONTROLLED  . Brain aneurysm 2007   NEUROLOGY NOTE DOES NOT MENTION ANEURYSM BUT PT STATES SHE DID NOT HAVE TO HAVE SURGERY  . Complication of anesthesia    FOR 1 C-SECTION PT WAS ITCHING AND VERY RED ON HER FACE  . Family history of adverse reaction to anesthesia    brother, neice and nephew got red in face with hives  . Gallstones   . GERD (gastroesophageal reflux disease)   . Headache    CHRONIC HEADACHES  . History of chronic cough    DRY  . Pneumonia   . PTSD (post-traumatic stress disorder)   . PTSD (post-traumatic stress disorder)   . Thyroid condition    PT WAS JUST TOLD ON 08-25-17 THAT SHE HAS A THYROID PROBLEM AND IS GOING TO F/U WITH ENDOCRINOLOGIST IN 2 WEEKS    Patient Active Problem List   Diagnosis Date Noted  . Vaginal cuff dehiscence 11/05/2017  . S/P laparoscopic hysterectomy 09/29/2017    Past Surgical History:  Procedure Laterality Date  . ABDOMINAL HYSTERECTOMY    . CESAREAN SECTION     x3  . CYSTOSCOPY N/A 09/29/2017   Procedure: CYSTOSCOPY;  Surgeon: Vena Austria, MD;  Location: [facility] ORS;  Service: Gynecology;  Laterality: N/A;  . CYSTOSCOPY N/A 11/05/2017   Procedure: CYSTOSCOPY;  Surgeon: Vena Austria, MD;  Location: [facility] ORS;  Service: Gynecology;  Laterality: N/A;  . HYSTEROSCOPY WITH D & C N/A 09/01/2017   Procedure: DILATATION AND CURETTAGE /HYSTEROSCOPY;  Surgeon: Vena Austria, MD;  Location: [facility]  ORS;  Service: Gynecology;  Laterality: N/A;  . LAPAROSCOPY N/A 09/01/2017   Procedure: LAPAROSCOPY OPERATIVE with biopsy;  Surgeon: Vena Austria, MD;  Location: [facility] ORS;  Service: Gynecology;  Laterality: N/A;  . LAPAROSCOPY N/A 11/05/2017   Procedure: LAPAROSCOPY DIAGNOSTIC;  Surgeon: Vena Austria, MD;  Location: [facility] ORS;  Service: Gynecology;  Laterality: N/A;  . REPAIR VAGINAL CUFF N/A 11/05/2017   Procedure: REPAIR VAGINAL CUFF;  Surgeon: Vena Austria, MD;  Location: [facility] ORS;  Service: Gynecology;  Laterality: N/A;  . TOTAL LAPAROSCOPIC HYSTERECTOMY WITH SALPINGECTOMY Bilateral 09/29/2017   Procedure: TOTAL LAPAROSCOPIC HYSTERECTOMY WITH SALPINGECTOMY;  Surgeon: Vena Austria, MD;  Location: [facility] ORS;  Service: Gynecology;  Laterality: Bilateral;    OB History    Gravida  3   Para  3   Term  3   Preterm      AB      Living  3     SAB      TAB      Ectopic      Multiple      Live Births  3            Home Medications    Prior to Admission medications   Medication Sig Start Date End Date Taking? Authorizing Provider  albuterol (PROVENTIL HFA;VENTOLIN HFA) 108 (90 Base) MCG/ACT inhaler Inhale 2 puffs into the lungs every 6 (six) hours as needed for wheezing or shortness of breath.  01/03/15   [provider]  albuterol (VENTOLIN HFA) 108 (90 Base) MCG/ACT inhaler Inhale 2 puffs into the lungs every 6 (six) hours as needed for wheezing or shortness of breath. 09/19/18   Darci Current, MD  cetirizine (ZYRTEC) 10 MG tablet Take 10 mg by mouth daily. 01/02/20   [provider]  EPIPEN 2-PAK 0.3 MG/0.3ML SOAJ injection INJECT INTO THIGH MUSCLE THROUGH CLOTHES AS NEEDED SEVERE ALLERGIC REACTION 01/29/15   [provider]  fluticasone (FLONASE) 50 MCG/ACT nasal spray 1 spray by Each Nare route daily. 07/28/18 07/28/19  [provider]  methimazole (TAPAZOLE) 5 MG tablet Take by mouth. 08/31/18   [provider]    Multiple Vitamins-Minerals (CENTRUM ADULTS PO) Take 1 tablet by mouth at bedtime.     [provider]  omeprazole (PRILOSEC) 20 MG capsule Take 20 mg by mouth at bedtime.  03/11/17   [provider]    Family History Family History  Problem Relation Age of Onset  . Hypertension Mother   . Alcohol abuse Mother   . Deep vein thrombosis Mother   . Alcohol abuse Father   . COPD Father   . Emphysema Father   . Heart disease Father     Social History Social History   Tobacco Use  . Smoking status: Current Every Day Smoker    Packs/day: 0.50    Years: 18.00    Pack years: 9.00    Types: Cigarettes  . Smokeless tobacco: Never Used  Vaping Use  . Vaping Use: Never used  Substance Use Topics  . Alcohol use: Yes    Alcohol/week: 0.0 standard drinks    Comment: occasionally  . Drug use: Not Currently    Types: Marijuana     Allergies   Azithromycin, Meloxicam, Neurontin [gabapentin], Penicillins, Clindamycin/lincomycin, Codeine, Other, Diphenhydramine hcl, and Doxycycline   Review of Systems Review of Systems  Constitutional: Negative for appetite change, chills, diaphoresis, fatigue and fever.  HENT: Positive for postnasal drip. Negative for congestion, rhinorrhea and sore throat.   Eyes: Negative for discharge.  Respiratory: Positive for cough. Negative for chest tightness and shortness of breath.   Cardiovascular: Negative for chest pain.  Gastrointestinal: Negative for abdominal pain, diarrhea, nausea and vomiting.  Musculoskeletal: Positive for myalgias.  Skin: Negative for rash.  Neurological: Negative for headaches.    Physical Exam Triage Vital Signs ED Triage Vitals  Enc Vitals Group     BP 01/05/20 1358 108/74     Pulse Rate 01/05/20 1358 95     Resp 01/05/20 1358 17     Temp 01/05/20 1358 98.3 F (36.8 C)     Temp Source 01/05/20 1358 Oral     SpO2 01/05/20 1358 99 %     Weight 01/05/20 1401 215 lb (97.5 kg)     Height 01/05/20 1401  5\' 7"  (1.702 m)     Head Circumference --      Peak Flow --      Pain Score 01/05/20 1401 7     Pain Loc --      Pain Edu? --      Excl. in GC? --    No data found.  Updated Vital Signs BP 108/74 (BP Location: Right Arm)   Pulse 95   Temp 98.3 F (36.8 C) (Oral)  Resp 17   Ht 5\' 7"  (1.702 m)   Wt 215 lb (97.5 kg)   LMP 08/22/2017 Comment: says she just spots.  SpO2 99%   BMI 33.67 kg/m   Visual Acuity Right Eye Distance:   Left Eye Distance:   Bilateral Distance:    Right Eye Near:   Left Eye Near:    Bilateral Near:     Physical Exam Physical Exam Vitals signs and nursing note reviewed.  Constitutional:      General: She is not in acute distress.    Appearance: Normal appearance. She is not ill-appearing, toxic-appearing or diaphoretic.  HENT:     Head: Normocephalic.     Right Ear: Tympanic membrane, ear canal and external ear normal.     Left Ear: Tympanic membrane, ear canal and external ear normal.     Nose: Nose normal.     Mouth/Throat: clear. Her L TMJ is tender    Mouth: Mucous membranes are moist.  Eyes:     General: No scleral icterus.       Right eye: No discharge.        Left eye: No discharge.     Conjunctiva/sclera: Conjunctivae normal.  Neck:     Musculoskeletal: Neck supple. No neck rigidity.  Cardiovascular:     Rate and Rhythm: Normal rate and regular rhythm.     Heart sounds: No murmur.  Pulmonary:     Effort: Pulmonary effort is normal.     Breath sounds: Normal breath sounds.   Musculoskeletal: Normal range of motion.  Lymphadenopathy:     Cervical: No cervical adenopathy.  Skin:    General: Skin is warm and dry.     Coloration: Skin is not jaundiced.     Findings: No rash.  Neurological:     Mental Status: She is alert and oriented to person, place, and time.     Gait: Gait normal.  Psychiatric:        Mood and Affect: Mood normal.        Behavior: Behavior normal.        Thought Content: Thought content normal.         Judgment: Judgment normal.   UC Treatments / Results  Labs (all labs ordered are listed, but only abnormal results are displayed) Labs Reviewed  SARS CORONAVIRUS 2 (TAT 6-24 HRS)    EKG   Radiology No results found.  Procedures Procedures (including critical care time)  Medications Ordered in UC Medications - No data to display  Initial Impression / Assessment and Plan / UC Course  I have reviewed the triage vital signs and the nursing notes. Covid test is pending. I believe her L ear pain is referred pain from her L TMJ. Advised to take Advil and stay on soft foods x 7 days.  Supportive care advised. See instructions  Final Clinical Impressions(s) / UC Diagnoses   Final diagnoses:  None   Discharge Instructions   None    ED Prescriptions    None     PDMP not reviewed this encounter.   08/24/2017, PA-C 01/05/20 1600

## 2020-01-05 NOTE — ED Triage Notes (Addendum)
Pt with cough and body aches starting 2 days ago. Exposure to COVID last week. Pt also with left ear pain

## 2020-01-06 LAB — SARS CORONAVIRUS 2 (TAT 6-24 HRS): SARS Coronavirus 2: NEGATIVE

## 2020-01-14 ENCOUNTER — Other Ambulatory Visit: Payer: Self-pay

## 2020-01-14 ENCOUNTER — Encounter: Payer: Self-pay | Admitting: Obstetrics and Gynecology

## 2020-01-14 ENCOUNTER — Ambulatory Visit (INDEPENDENT_AMBULATORY_CARE_PROVIDER_SITE_OTHER): Payer: Medicaid Other | Admitting: Obstetrics and Gynecology

## 2020-01-14 VITALS — BP 126/80 | Ht 67.0 in | Wt 217.0 lb

## 2020-01-14 DIAGNOSIS — Z01419 Encounter for gynecological examination (general) (routine) without abnormal findings: Secondary | ICD-10-CM

## 2020-01-14 DIAGNOSIS — Z1239 Encounter for other screening for malignant neoplasm of breast: Secondary | ICD-10-CM | POA: Diagnosis not present

## 2020-01-14 DIAGNOSIS — N760 Acute vaginitis: Secondary | ICD-10-CM

## 2020-01-14 DIAGNOSIS — Z0000 Encounter for general adult medical examination without abnormal findings: Secondary | ICD-10-CM | POA: Diagnosis not present

## 2020-01-14 DIAGNOSIS — Z113 Encounter for screening for infections with a predominantly sexual mode of transmission: Secondary | ICD-10-CM | POA: Diagnosis not present

## 2020-01-14 NOTE — Progress Notes (Signed)
Gynecology Annual Exam   PCP: [facility]  Chief Complaint:  Chief Complaint  Patient presents with  . Gynecologic Exam    History of Present Illness: Patient is a 34 y.o. G3P3003 presents for annual exam. The patient has no complaints today.   LMP: Patient's last menstrual period was 08/22/2017.  The patient is sexually active. She currently uses status post hysterectomy for contraception. She admits to dyspareunia.  The patient does perform self breast exams.  There is no notable family history of breast or ovarian cancer in her family.  The patient wears seatbelts: yes.   The patient has regular exercise: not asked.    The patient denies current symptoms of depression.    Review of Systems: Review of Systems  Constitutional: Negative for chills and fever.  HENT: Negative for congestion.   Respiratory: Negative for cough and shortness of breath.   Cardiovascular: Negative for chest pain and palpitations.  Gastrointestinal: Negative for abdominal pain, constipation, diarrhea, heartburn, nausea and vomiting.  Genitourinary: Negative for dysuria, frequency and urgency.  Skin: Negative for itching and rash.  Neurological: Negative for dizziness and headaches.  Endo/Heme/Allergies: Negative for polydipsia.  Psychiatric/Behavioral: Negative for depression.    Past Medical History:  Patient Active Problem List   Diagnosis Date Noted  . Vaginal cuff dehiscence 11/05/2017  . S/P laparoscopic hysterectomy 09/29/2017    Past Surgical History:  Past Surgical History:  Procedure Laterality Date  . ABDOMINAL HYSTERECTOMY    . CESAREAN SECTION     x3  . CYSTOSCOPY N/A 09/29/2017   Procedure: CYSTOSCOPY;  Surgeon: Vena Austria, MD;  Location: [facility];  Service: Gynecology;  Laterality: N/A;  . CYSTOSCOPY N/A 11/05/2017   Procedure: CYSTOSCOPY;  Surgeon: Vena Austria, MD;  Location: [facility];  Service: Gynecology;  Laterality: N/A;  . HYSTEROSCOPY WITH D & C  N/A 09/01/2017   Procedure: DILATATION AND CURETTAGE /HYSTEROSCOPY;  Surgeon: Vena Austria, MD;  Location: [facility];  Service: Gynecology;  Laterality: N/A;  . LAPAROSCOPY N/A 09/01/2017   Procedure: LAPAROSCOPY OPERATIVE with biopsy;  Surgeon: Vena Austria, MD;  Location: [facility];  Service: Gynecology;  Laterality: N/A;  . LAPAROSCOPY N/A 11/05/2017   Procedure: LAPAROSCOPY DIAGNOSTIC;  Surgeon: Vena Austria, MD;  Location: [facility];  Service: Gynecology;  Laterality: N/A;  . REPAIR VAGINAL CUFF N/A 11/05/2017   Procedure: REPAIR VAGINAL CUFF;  Surgeon: Vena Austria, MD;  Location: [facility];  Service: Gynecology;  Laterality: N/A;  . TOTAL LAPAROSCOPIC HYSTERECTOMY WITH SALPINGECTOMY Bilateral 09/29/2017   Procedure: TOTAL LAPAROSCOPIC HYSTERECTOMY WITH SALPINGECTOMY;  Surgeon: Vena Austria, MD;  Location: [facility];  Service: Gynecology;  Laterality: Bilateral;    Gynecologic History:  Patient's last menstrual period was 08/22/2017. Contraception: status post hysterectomy Last Pap: Results were: N/A  Obstetric History: G3P3003  Family History:  Family History  Problem Relation Age of Onset  . Hypertension Mother   . Alcohol abuse Mother   . Deep vein thrombosis Mother   . Alcohol abuse Father   . COPD Father   . Emphysema Father   . Heart disease Father     Social History:  Social History   Socioeconomic History  . Marital status: Single    Spouse name: Not on file  . Number of children: Not on file  . Years of education: Not on file  . Highest education level: Not on file  Occupational History  . Not on file  Tobacco Use  . Smoking status: Current Every  Day Smoker    Packs/day: 0.50    Years: 18.00    Pack years: 9.00    Types: Cigarettes  . Smokeless tobacco: Never Used  Vaping Use  . Vaping Use: Never used  Substance and Sexual Activity  . Alcohol use: Not Currently    Alcohol/week: 0.0 standard drinks    Comment: occasionally  . Drug use:  Not Currently    Types: Marijuana  . Sexual activity: Yes    Partners: Male    Birth control/protection: Surgical    Comment: Hysterectomy  Other Topics Concern  . Not on file  Social History Narrative  . Not on file   Social Determinants of Health   Financial Resource Strain:   . Difficulty of Paying Living Expenses: Not on file  Food Insecurity:   . Worried About Programme researcher, broadcasting/film/video in the Last Year: Not on file  . Ran Out of Food in the Last Year: Not on file  Transportation Needs:   . Lack of Transportation (Medical): Not on file  . Lack of Transportation (Non-Medical): Not on file  Physical Activity:   . Days of Exercise per Week: Not on file  . Minutes of Exercise per Session: Not on file  Stress:   . Feeling of Stress : Not on file  Social Connections:   . Frequency of Communication with Friends and Family: Not on file  . Frequency of Social Gatherings with Friends and Family: Not on file  . Attends Religious Services: Not on file  . Active Member of Clubs or Organizations: Not on file  . Attends Banker Meetings: Not on file  . Marital Status: Not on file  Intimate Partner Violence:   . Fear of Current or Ex-Partner: Not on file  . Emotionally Abused: Not on file  . Physically Abused: Not on file  . Sexually Abused: Not on file    Allergies:  Allergies  Allergen Reactions  . Azithromycin Swelling and Other (See Comments)  . Meloxicam Other (See Comments)    Burns up from the inside   . Neurontin [Gabapentin] Other (See Comments)    Makes her pass out and not remember what happened prior to taking med.  Marland Kitchen Penicillins Anaphylaxis    Patient allergic to all "cillins" Has patient had a PCN reaction causing immediate rash, facial/tongue/throat swelling, SOB or lightheadedness with hypotension: Yes Has patient had a PCN reaction causing severe rash involving mucus membranes or skin necrosis: Yes Has patient had a PCN reaction that required  hospitalization: No Has patient had a PCN reaction occurring within the last 10 years: Yes If all of the above answers are "NO", then may proceed with Cephalosporin use.   . Clindamycin/Lincomycin Swelling  . Codeine Hives    Nausea/dizzy  . Other Hives and Other (See Comments)    Berry flavored food and drinks  . Diphenhydramine Hcl Rash  . Doxycycline Rash    Medications: Prior to Admission medications   Medication Sig Start Date End Date Taking? Authorizing Provider  albuterol (PROVENTIL HFA;VENTOLIN HFA) 108 (90 Base) MCG/ACT inhaler Inhale 2 puffs into the lungs every 6 (six) hours as needed for wheezing or shortness of breath.  01/03/15  Yes [provider]  cetirizine (ZYRTEC) 10 MG tablet Take 10 mg by mouth daily. 01/02/20  Yes [provider]  EPIPEN 2-PAK 0.3 MG/0.3ML SOAJ injection INJECT INTO THIGH MUSCLE THROUGH CLOTHES AS NEEDED SEVERE ALLERGIC REACTION 01/29/15  Yes [provider]  methimazole (TAPAZOLE)  5 MG tablet Take by mouth. 08/31/18  Yes [provider]  Multiple Vitamins-Minerals (CENTRUM ADULTS PO) Take 1 tablet by mouth at bedtime.    Yes [provider]  omeprazole (PRILOSEC) 20 MG capsule Take 20 mg by mouth at bedtime.  03/11/17  Yes [provider]  albuterol (VENTOLIN HFA) 108 (90 Base) MCG/ACT inhaler Inhale 2 puffs into the lungs every 6 (six) hours as needed for wheezing or shortness of breath. 09/19/18   Darci Current, MD  fluticasone (FLONASE) 50 MCG/ACT nasal spray 1 spray by Each Nare route daily. 07/28/18 07/28/19  [provider]    Physical Exam Vitals: Blood pressure 126/80, height 5\' 7"  (1.702 m), weight 217 lb (98.4 kg), last menstrual period 08/22/2017.  General: NAD HEENT: normocephalic, anicteric Thyroid: no enlargement, no palpable nodules Pulmonary: No increased work of breathing, CTAB Cardiovascular: RRR, distal pulses 2+ Breast: Breast symmetrical, no tenderness, no  palpable nodules or masses, no skin or nipple retraction present, no nipple discharge.  No axillary or supraclavicular lymphadenopathy. Abdomen: NABS, soft, non-tender, non-distended.  Umbilicus without lesions.  No hepatomegaly, splenomegaly or masses palpable. No evidence of hernia  Genitourinary:  External: Normal external female genitalia.  Normal urethral meatus, normal Bartholin's and Skene's glands.    Vagina: Normal vaginal mucosa, no evidence of prolapse.    Cervix: surgically absent  Uterus: surgically absent  Adnexa: ovaries non-enlarged, no adnexal masses  Rectal: deferred  Lymphatic: no evidence of inguinal lymphadenopathy Extremities: no edema, erythema, or tenderness Neurologic: Grossly intact Psychiatric: mood appropriate, affect full  Female chaperone present for pelvic and breast  portions of the physical exam    Assessment: 34 y.o. G3P3003 routine annual exam  Plan: Problem List Items Addressed This Visit    None    Visit Diagnoses    Routine screening for STI (sexually transmitted infection)    -  Primary   Relevant Orders   NuSwab Vaginitis Plus (VG+)   HEP, RPR, HIV Panel   Encounter for gynecological examination without abnormal finding       Acute vaginitis       Relevant Orders   NuSwab Vaginitis Plus (VG+)   Breast screening          1) STI screening  wasoffered and accepted  2)  ASCCP guidelines and rational discussed.  Patient opts for discontinue secondary to prior hysterectomy screening interval  3) Contraception - the patient is currently using  status post hysterectomy.  She is not currently in need of contraception secondary to being sterile  4) Routine healthcare maintenance including cholesterol, diabetes screening discussed managed by PCP  5) Return in about 1 year (around 01/13/2021) for annual.   03/15/2021, MD, Vena Austria [facility], [facility] 01/14/2020, 3:14 PM

## 2020-01-15 LAB — HEP, RPR, HIV PANEL
HIV Screen 4th Generation wRfx: NONREACTIVE
Hepatitis B Surface Ag: NEGATIVE
RPR Ser Ql: NONREACTIVE

## 2020-01-17 LAB — NUSWAB VAGINITIS PLUS (VG+)
Candida albicans, NAA: POSITIVE — AB
Candida glabrata, NAA: NEGATIVE
Chlamydia trachomatis, NAA: NEGATIVE
Neisseria gonorrhoeae, NAA: NEGATIVE
Trich vag by NAA: NEGATIVE

## 2020-01-18 ENCOUNTER — Other Ambulatory Visit: Payer: Self-pay | Admitting: Obstetrics and Gynecology

## 2020-01-18 MED ORDER — FLUCONAZOLE 150 MG PO TABS: 150.0000 mg | ORAL_TABLET | Freq: Once | ORAL | 0 refills | Status: DC

## 2020-01-18 MED ORDER — FLUCONAZOLE 150 MG PO TABS: 150.0000 mg | ORAL_TABLET | Freq: Once | ORAL | 0 refills | Status: AC

## 2020-01-18 NOTE — Telephone Encounter (Signed)
Patient viewed her results on my chart. She's inquiring about a rx. Cb#[phone]

## 2020-01-18 NOTE — Telephone Encounter (Signed)
Appointment cancelled

## 2020-01-18 NOTE — Telephone Encounter (Signed)
Rx called in 

## 2020-01-18 NOTE — Telephone Encounter (Signed)
Pt aware. Patient requests results be given by phone. She misread her results and thought she had an STD (Trich).

## 2020-01-30 ENCOUNTER — Ambulatory Visit: Payer: Medicaid Other | Admitting: Obstetrics and Gynecology

## 2020-02-03 ENCOUNTER — Other Ambulatory Visit: Payer: Self-pay

## 2020-02-03 ENCOUNTER — Encounter: Payer: Self-pay | Admitting: Emergency Medicine

## 2020-02-03 ENCOUNTER — Ambulatory Visit
Admission: EM | Admit: 2020-02-03 | Discharge: 2020-02-03 | Disposition: A | Discharge status: Home / Self Care | Payer: Medicaid Other | Attending: Physician Assistant | Admitting: Physician Assistant

## 2020-02-03 DIAGNOSIS — R300 Dysuria: Secondary | ICD-10-CM | POA: Insufficient documentation

## 2020-02-03 DIAGNOSIS — R102 Pelvic and perineal pain: Secondary | ICD-10-CM

## 2020-02-03 DIAGNOSIS — M545 Low back pain, unspecified: Secondary | ICD-10-CM | POA: Insufficient documentation

## 2020-02-03 LAB — WET PREP, GENITAL
Clue Cells Wet Prep HPF POC: NONE SEEN
Sperm: NONE SEEN
Trich, Wet Prep: NONE SEEN
Yeast Wet Prep HPF POC: NONE SEEN

## 2020-02-03 LAB — URINALYSIS, COMPLETE (UACMP) WITH MICROSCOPIC
Bacteria, UA: NONE SEEN
Bilirubin Urine: NEGATIVE
Glucose, UA: NEGATIVE mg/dL
Hgb urine dipstick: NEGATIVE
Ketones, ur: NEGATIVE mg/dL
Leukocytes,Ua: NEGATIVE
Nitrite: NEGATIVE
Protein, ur: NEGATIVE mg/dL
RBC / HPF: NONE SEEN RBC/hpf (ref 0–5)
Specific Gravity, Urine: 1.015 (ref 1.005–1.030)
WBC, UA: NONE SEEN WBC/hpf (ref 0–5)
pH: 6 (ref 5.0–8.0)

## 2020-02-03 LAB — CHLAMYDIA/NGC RT PCR (ARMC ONLY)
Chlamydia Tr: NOT DETECTED
N gonorrhoeae: NOT DETECTED

## 2020-02-03 MED ORDER — NAPROXEN 500 MG PO TABS: 500.0000 mg | ORAL_TABLET | Freq: Two times a day (BID) | ORAL | 0 refills | Status: AC

## 2020-02-03 MED ORDER — PHENAZOPYRIDINE HCL 200 MG PO TABS: 200.0000 mg | ORAL_TABLET | Freq: Three times a day (TID) | ORAL | 0 refills | Status: AC

## 2020-02-03 MED ORDER — NITROFURANTOIN MONOHYD MACRO 100 MG PO CAPS: 100.0000 mg | ORAL_CAPSULE | Freq: Two times a day (BID) | ORAL | 0 refills | Status: AC

## 2020-02-03 NOTE — Discharge Instructions (Addendum)
As discussed, your urinalysis was normal today.  While this does not completely rule out a urinary tract infection it does give [place] low suspicion for a UTI.  We will send the urine for culture.  At this time begin Pyridium which will help the burning symptom and increase your fluid intake and rest.  You can take naproxen for pain.  A vaginal swab was negative for bacterial vaginosis, yeast infection, and trichomonas infection.  You have performed another vaginal swab which checks for gonorrhea and chlamydia.  You declined a pelvic exam today.  As I mentioned, sexually transmitted infections are now ruled out until we get the results back which should be back tomorrow.  You are allergic to most of the medications that treat these STIs so we will hold off on treatment until we get the result back.  If you test positive for something you may end up having to go to the emergency room to be monitored well treated.  Do not have any sexual intercourse until your results are negative.  Follow-up with your gynecologist about the pelvic pain since you have chronic pelvic pain already.  Go to the emergency room if you develop fever, increased pelvic or back pain or any new or worsening symptoms.

## 2020-02-03 NOTE — ED Triage Notes (Signed)
Patient c/o burning when urinating that started yesterday.  Patient states that she had sex with a condom that was not lubricated. Patient reports reports lower back and abdominal pain that got worse yesterday.  Patient reports history of chronic pain.  Patient denies N/V/D.  Patient currently on medicine for yeast infection.

## 2020-02-03 NOTE — ED Provider Notes (Signed)
[facility]    CSN: [id number] Arrival date & time: 02/03/20  1417      History   Chief Complaint Chief Complaint  Patient presents with  . Abdominal Pain  . Back Pain  . Dysuria    HPI Elizabeth Spencer is a 34 y.o. female presenting for onset of dysuria and left-sided pelvic pain as well as bilateral lower back pain yesterday.  Patient states that she recently used a different type of condom that is nonlatex with a new partner.  She states she has never used this type of condom before.  Patient also states that a couple days before she did not use protection with her new partner.  Patient did have negative STI testing 1 month ago.  She states that her partner told her he was "clean," but she did not see any proof.  Patient does admit to chronic pelvic pain and back pain already.  She says that her pain today is different.  She has had a hysterectomy due to chronic pelvic pain.  Patient denies any abnormal vaginal discharge, itching or lesions.  She denies any urinary frequency, urgency or hematuria.  Patient says she thinks she has a UTI, but admits that her symptoms are different than anything she has had in the past.  Does not have any other concerns or complaints today.  HPI  Past Medical History:  Diagnosis Date  . Arthritis   . Asthma    WELL CONTROLLED  . Brain aneurysm 2007   NEUROLOGY NOTE DOES NOT MENTION ANEURYSM BUT PT STATES SHE DID NOT HAVE TO HAVE SURGERY  . Complication of anesthesia    FOR 1 C-SECTION PT WAS ITCHING AND VERY RED ON HER FACE  . Family history of adverse reaction to anesthesia    brother, neice and nephew got red in face with hives  . Gallstones   . GERD (gastroesophageal reflux disease)   . Headache    CHRONIC HEADACHES  . History of chronic cough    DRY  . Pneumonia   . PTSD (post-traumatic stress disorder)   . PTSD (post-traumatic stress disorder)   . Thyroid condition    PT WAS JUST TOLD ON 08-25-17 THAT SHE HAS A THYROID  PROBLEM AND IS GOING TO F/U WITH ENDOCRINOLOGIST IN 2 WEEKS    Patient Active Problem List   Diagnosis Date Noted  . Vaginal cuff dehiscence 11/05/2017  . S/P laparoscopic hysterectomy 09/29/2017    Past Surgical History:  Procedure Laterality Date  . ABDOMINAL HYSTERECTOMY    . CESAREAN SECTION     x3  . CYSTOSCOPY N/A 09/29/2017   Procedure: CYSTOSCOPY;  Surgeon: Vena Austria, MD;  Location: [facility];  Service: Gynecology;  Laterality: N/A;  . CYSTOSCOPY N/A 11/05/2017   Procedure: CYSTOSCOPY;  Surgeon: Vena Austria, MD;  Location: [facility];  Service: Gynecology;  Laterality: N/A;  . HYSTEROSCOPY WITH D & C N/A 09/01/2017   Procedure: DILATATION AND CURETTAGE /HYSTEROSCOPY;  Surgeon: Vena Austria, MD;  Location: [facility];  Service: Gynecology;  Laterality: N/A;  . LAPAROSCOPY N/A 09/01/2017   Procedure: LAPAROSCOPY OPERATIVE with biopsy;  Surgeon: Vena Austria, MD;  Location: [facility];  Service: Gynecology;  Laterality: N/A;  . LAPAROSCOPY N/A 11/05/2017   Procedure: LAPAROSCOPY DIAGNOSTIC;  Surgeon: Vena Austria, MD;  Location: [facility];  Service: Gynecology;  Laterality: N/A;  . REPAIR VAGINAL CUFF N/A 11/05/2017   Procedure: REPAIR VAGINAL CUFF;  Surgeon: Vena Austria, MD;  Location: [facility];  Service: Gynecology;  Laterality: N/A;  . TOTAL LAPAROSCOPIC HYSTERECTOMY WITH SALPINGECTOMY Bilateral 09/29/2017   Procedure: TOTAL LAPAROSCOPIC HYSTERECTOMY WITH SALPINGECTOMY;  Surgeon: Vena Austria, MD;  Location: [facility];  Service: Gynecology;  Laterality: Bilateral;    OB History    Gravida  3   Para  3   Term  3   Preterm      AB      Living  3     SAB      TAB      Ectopic      Multiple      Live Births  3            Home Medications    Prior to Admission medications   Medication Sig Start Date End Date Taking? Authorizing Provider  albuterol (VENTOLIN HFA) 108 (90 Base) MCG/ACT inhaler Inhale 2 puffs into the lungs every 6  (six) hours as needed for wheezing or shortness of breath. 09/19/18  Yes Darci Current, MD  cetirizine (ZYRTEC) 10 MG tablet Take 10 mg by mouth daily. 01/02/20  Yes [provider]  fluticasone (FLONASE) 50 MCG/ACT nasal spray 1 spray by Each Nare route daily. 07/28/18 02/03/20 Yes [provider]  phentermine (ADIPEX-P) 37.5 MG tablet Take by mouth. 10/16/19  Yes [provider]  albuterol (PROVENTIL HFA;VENTOLIN HFA) 108 (90 Base) MCG/ACT inhaler Inhale 2 puffs into the lungs every 6 (six) hours as needed for wheezing or shortness of breath.  01/03/15   [provider]  EPIPEN 2-PAK 0.3 MG/0.3ML SOAJ injection INJECT INTO THIGH MUSCLE THROUGH CLOTHES AS NEEDED SEVERE ALLERGIC REACTION 01/29/15   [provider]  fluconazole (DIFLUCAN) 150 MG tablet Take 150 mg by mouth every 3 (three) days. 01/23/20   [provider]  hydrOXYzine (ATARAX/VISTARIL) 25 MG tablet Take 25 mg by mouth 3 (three) times daily. 01/02/20   [provider]  loratadine (CLARITIN) 5 MG/5ML syrup Take by mouth.    [provider]  methimazole (TAPAZOLE) 5 MG tablet Take by mouth. 08/31/18   [provider]  montelukast (SINGULAIR) 10 MG tablet Take 10 mg by mouth daily. 09/10/19   [provider]  Multiple Vitamins-Minerals (CENTRUM ADULTS PO) Take 1 tablet by mouth at bedtime.     [provider]  naproxen (NAPROSYN) 500 MG tablet Take 1 tablet (500 mg total) by mouth 2 (two) times daily for 5 days. 02/03/20 02/08/20  Shirlee Latch, PA-C  nitrofurantoin, macrocrystal-monohydrate, (MACROBID) 100 MG capsule Take 1 capsule (100 mg total) by mouth 2 (two) times daily for 5 days. 02/03/20 02/08/20  Shirlee Latch, PA-C  omeprazole (PRILOSEC) 20 MG capsule Take 20 mg by mouth at bedtime.  03/11/17   [provider]  phenazopyridine (PYRIDIUM) 200 MG tablet Take 1 tablet (200 mg total) by mouth 3 (three) times daily for 2 days.  02/03/20 02/05/20  Shirlee Latch, PA-C    Family History Family History  Problem Relation Age of Onset  . Hypertension Mother   . Alcohol abuse Mother   . Deep vein thrombosis Mother   . Alcohol abuse Father   . COPD Father   . Emphysema Father   . Heart disease Father     Social History Social History   Tobacco Use  . Smoking status: Current Every Day Smoker    Packs/day: 0.50    Years: 18.00    Pack years: 9.00    Types: Cigarettes  . Smokeless tobacco: Never Used  Vaping Use  .  Vaping Use: Never used  Substance Use Topics  . Alcohol use: Not Currently    Alcohol/week: 0.0 standard drinks    Comment: occasionally  . Drug use: Not Currently    Types: Marijuana     Allergies   Azithromycin, Meloxicam, Neurontin [gabapentin], Penicillins, Clindamycin/lincomycin, Codeine, Other, Diphenhydramine hcl, and Doxycycline   Review of Systems Review of Systems  Constitutional: Negative for fatigue and fever.  Gastrointestinal: Positive for abdominal pain.  Genitourinary: Positive for dysuria. Negative for flank pain, frequency, hematuria, urgency, vaginal bleeding, vaginal discharge and vaginal pain.  Musculoskeletal: Positive for back pain.  Skin: Negative for rash.     Physical Exam Triage Vital Signs ED Triage Vitals  Enc Vitals Group     BP 02/03/20 1438 128/70     Pulse Rate 02/03/20 1438 95     Resp 02/03/20 1438 14     Temp 02/03/20 1438 98.1 F (36.7 C)     Temp Source 02/03/20 1438 Oral     SpO2 02/03/20 1438 100 %     Weight 02/03/20 1434 210 lb (95.3 kg)     Height 02/03/20 1434 5\' 7"  (1.702 m)     Head Circumference --      Peak Flow --      Pain Score 02/03/20 1434 7     Pain Loc --      Pain Edu? --      Excl. in GC? --    No data found.  Updated Vital Signs BP 128/70 (BP Location: Right Arm)   Pulse 95   Temp 98.1 F (36.7 C) (Oral)   Resp 14   Ht 5\' 7"  (1.702 m)   Wt 210 lb (95.3 kg)   LMP 08/22/2017 Comment: says she just spots.   SpO2 100%   BMI 32.89 kg/m      Physical Exam Vitals and nursing note reviewed.  Constitutional:      General: She is not in acute distress.    Appearance: Normal appearance. She is not ill-appearing, toxic-appearing or diaphoretic.  HENT:     Head: Normocephalic and atraumatic.  Eyes:     General: No scleral icterus.       Right eye: No discharge.        Left eye: No discharge.     Conjunctiva/sclera: Conjunctivae normal.  Cardiovascular:     Rate and Rhythm: Normal rate and regular rhythm.     Heart sounds: Normal heart sounds.  Pulmonary:     Effort: Pulmonary effort is normal. No respiratory distress.     Breath sounds: Normal breath sounds.  Abdominal:     Palpations: Abdomen is soft.     Tenderness: There is abdominal tenderness (LLQ). There is no right CVA tenderness, left CVA tenderness or guarding.  Musculoskeletal:     Cervical back: Neck supple.     Lumbar back: Tenderness (bilateral tenderness of lumbar region) present. Normal range of motion.  Skin:    General: Skin is dry.  Neurological:     General: No focal deficit present.     Mental Status: She is alert. Mental status is at baseline.     Motor: No weakness.     Gait: Gait normal.  Psychiatric:        Mood and Affect: Mood normal.        Behavior: Behavior normal.        Thought Content: Thought content normal.      UC Treatments / Results  Labs (all labs  ordered are listed, but only abnormal results are displayed) Labs Reviewed  WET PREP, GENITAL - Abnormal; Notable for the following components:      Result Value   WBC, Wet Prep HPF POC FEW (*)    All other components within normal limits  CHLAMYDIA/NGC RT PCR ([facility] ONLY)  URINE CULTURE  URINALYSIS, COMPLETE (UACMP) WITH MICROSCOPIC    EKG   Radiology No results found.  Procedures Procedures (including critical care time)  Medications Ordered in UC Medications - No data to display  Initial Impression / Assessment and  Plan / UC Course  I have reviewed the triage vital signs and the nursing notes.  Pertinent labs & imaging results that were available during my care of the patient were reviewed by me and considered in my medical decision making (see chart for details).   34 year old female presenting for complaint of dysuria, pelvic pain and lower back pain patient does admit to chronic pelvic pain and chronic lower back pain, but states that her acute pain is worse than she normally experiences.  Urinalysis performed today which was within normal limits.  Wet prep also performed which was within normal limits.  Patient declined a pelvic exam today.  I advised her that I would like to do an exam to see if she has any vaginal lesions, abrasions or if there is any concern for possible pelvic inflammatory disease.  GC and Chlamydia testing performed.  Patient has multiple allergies including severe allergy to penicillins.  Also has allergy to azithromycin and doxycycline.  Advised patient that we will hold off on treatment for STIs unless positive.  Advised that she is positive she would probably need to be monitored if she received treatment due to her multiple allergies.  Patient agreeable.  Patient is concerned for possible urinary tract infection.  Advised her that her urinalysis was normal, but explained that it is still possible she could have a urinary tract infection despite a normal urinalysis.  Urine will be sent for culture.  At this time, treating symptoms with Pyridium.  Advised her that if she still having urinary symptoms after 2 days or if her urine culture is positive she should start the Macrobid.  Advised to increase fluid intake and rest.  Advise naproxen and Tylenol for pain.  Advised patient that if all testing is negative, I think she should follow-up with her primary care provider or gynecologist for further work-up.  ED precautions discussed especially for any worsening symptoms including  worsening pelvic pain or back pain.   Final Clinical Impressions(s) / UC Diagnoses   Final diagnoses:  Dysuria  Pelvic pain in female  Acute bilateral low back pain without sciatica     Discharge Instructions     As discussed, your urinalysis was normal today.  While this does not completely rule out a urinary tract infection it does give [place] low suspicion for a UTI.  We will send the urine for culture.  At this time begin Pyridium which will help the burning symptom and increase your fluid intake and rest.  You can take naproxen for pain.  A vaginal swab was negative for bacterial vaginosis, yeast infection, and trichomonas infection.  You have performed another vaginal swab which checks for gonorrhea and chlamydia.  You declined a pelvic exam today.  As I mentioned, sexually transmitted infections are now ruled out until we get the results back which should be back tomorrow.  You are allergic to most of the medications that treat  these STIs so we will hold off on treatment until we get the result back.  If you test positive for something you may end up having to go to the emergency room to be monitored well treated.  Do not have any sexual intercourse until your results are negative.  Follow-up with your gynecologist about the pelvic pain since you have chronic pelvic pain already.  Go to the emergency room if you develop fever, increased pelvic or back pain or any new or worsening symptoms.   ED Prescriptions    Medication Sig Dispense Auth. Provider   naproxen (NAPROSYN) 500 MG tablet Take 1 tablet (500 mg total) by mouth 2 (two) times daily for 5 days. 10 tablet Shirlee LatchEaves, Lumina Gitto B, PA-C   phenazopyridine (PYRIDIUM) 200 MG tablet Take 1 tablet (200 mg total) by mouth 3 (three) times daily for 2 days. 6 tablet Eusebio FriendlyEaves, Brittney Caraway B, PA-C   nitrofurantoin, macrocrystal-monohydrate, (MACROBID) 100 MG capsule Take 1 capsule (100 mg total) by mouth 2 (two) times daily for 5 days. 10 capsule Shirlee LatchEaves,  Alegandra Sommers B, PA-C     PDMP not reviewed this encounter.   Shirlee Latchaves, Christyana Corwin B, PA-C 02/04/20 2034

## 2020-02-04 ENCOUNTER — Telehealth: Payer: Self-pay

## 2020-02-04 LAB — URINE CULTURE: Culture: <10000 — AB

## 2020-02-04 NOTE — Telephone Encounter (Signed)
Pt calling; has question about test results from another office b/c they say it's going to take 7d before they can give her the results.  [phone]  Adv pt we cannot do that; the results need to come from the ordering provider.

## 2020-03-13 ENCOUNTER — Other Ambulatory Visit: Payer: Self-pay

## 2020-03-13 ENCOUNTER — Ambulatory Visit
Admission: EM | Admit: 2020-03-13 | Discharge: 2020-03-13 | Disposition: A | Discharge status: Home / Self Care | Payer: Medicaid Other | Attending: Family Medicine | Admitting: Family Medicine

## 2020-03-13 DIAGNOSIS — U071 COVID-19: Secondary | ICD-10-CM | POA: Diagnosis not present

## 2020-03-13 LAB — RESP PANEL BY RT-PCR (FLU A&B, COVID) ARPGX2
Influenza A by PCR: NEGATIVE
Influenza B by PCR: NEGATIVE
SARS Coronavirus 2 by RT PCR: POSITIVE — AB

## 2020-03-13 LAB — COMPREHENSIVE METABOLIC PANEL
ALT: 15 U/L (ref 0–44)
AST: 17 U/L (ref 15–41)
Albumin: 4.3 g/dL (ref 3.5–5.0)
Alkaline Phosphatase: 40 U/L (ref 38–126)
Anion gap: 9 (ref 5–15)
BUN: 6 mg/dL (ref 6–20)
CO2: 22 mmol/L (ref 22–32)
Calcium: 9.2 mg/dL (ref 8.9–10.3)
Chloride: 104 mmol/L (ref 98–111)
Creatinine, Ser: 0.66 mg/dL (ref 0.44–1.00)
GFR, Estimated: >60 mL/min (ref >60)
Glucose, Bld: 93 mg/dL (ref 70–99)
Potassium: 3.4 mmol/L — ABNORMAL LOW (ref 3.5–5.1)
Sodium: 135 mmol/L (ref 135–145)
Total Bilirubin: 0.4 mg/dL (ref 0.3–1.2)
Total Protein: 7.7 g/dL (ref 6.5–8.1)

## 2020-03-13 LAB — CBC WITH DIFFERENTIAL/PLATELET
Abs Immature Granulocytes: 0.01 10*3/uL (ref 0.00–0.07)
Basophils Absolute: 0 10*3/uL (ref 0.0–0.1)
Basophils Relative: 1 %
Eosinophils Absolute: 0 10*3/uL (ref 0.0–0.5)
Eosinophils Relative: 0 %
HCT: 49.4 % — ABNORMAL HIGH (ref 36.0–46.0)
Hemoglobin: 16.4 g/dL — ABNORMAL HIGH (ref 12.0–15.0)
Immature Granulocytes: 0 %
Lymphocytes Relative: 35 %
Lymphs Abs: 1.8 10*3/uL (ref 0.7–4.0)
MCH: 29.9 pg (ref 26.0–34.0)
MCHC: 33.2 g/dL (ref 30.0–36.0)
MCV: 90.1 fL (ref 80.0–100.0)
Monocytes Absolute: 0.9 10*3/uL (ref 0.1–1.0)
Monocytes Relative: 18 %
Neutro Abs: 2.3 10*3/uL (ref 1.7–7.7)
Neutrophils Relative %: 46 %
Platelets: 187 10*3/uL (ref 150–400)
RBC: 5.48 MIL/uL — ABNORMAL HIGH (ref 3.87–5.11)
RDW: 12.7 % (ref 11.5–15.5)
WBC: 5 10*3/uL (ref 4.0–10.5)
nRBC: 0 % (ref 0.0–0.2)

## 2020-03-13 LAB — LIPASE, BLOOD: Lipase: 28 U/L (ref 11–51)

## 2020-03-13 MED ORDER — ONDANSETRON 4 MG PO TBDP: 4.0000 mg | ORAL_TABLET | Freq: Three times a day (TID) | ORAL | 0 refills | Status: DC | PRN

## 2020-03-13 NOTE — ED Provider Notes (Signed)
[facility]    CSN: [id number] Arrival date & time: 03/13/20  1629      History   Chief Complaint Chief Complaint  Patient presents with  . Emesis  . Generalized Body Aches   HPI   34 year old female presents with the above complaints.  Patient reports nausea and vomiting and associated body aches over the past 3 days.  She has had some left ear pain as well.  Denies respiratory symptoms at this time.  Denies documented fever although she has had some sweats.  No known sick contacts.  No relieving factors.  No other complaints concerns at this time.  Past Medical History:  Diagnosis Date  . Arthritis   . Asthma    WELL CONTROLLED  . Brain aneurysm 2007   NEUROLOGY NOTE DOES NOT MENTION ANEURYSM BUT PT STATES SHE DID NOT HAVE TO HAVE SURGERY  . Complication of anesthesia    FOR 1 C-SECTION PT WAS ITCHING AND VERY RED ON HER FACE  . Family history of adverse reaction to anesthesia    brother, neice and nephew got red in face with hives  . Gallstones   . GERD (gastroesophageal reflux disease)   . Headache    CHRONIC HEADACHES  . History of chronic cough    DRY  . Pneumonia   . PTSD (post-traumatic stress disorder)   . PTSD (post-traumatic stress disorder)   . Thyroid condition    PT WAS JUST TOLD ON 08-25-17 THAT SHE HAS A THYROID PROBLEM AND IS GOING TO F/U WITH ENDOCRINOLOGIST IN 2 WEEKS    Patient Active Problem List   Diagnosis Date Noted  . Vaginal cuff dehiscence 11/05/2017  . S/P laparoscopic hysterectomy 09/29/2017    Past Surgical History:  Procedure Laterality Date  . ABDOMINAL HYSTERECTOMY    . CESAREAN SECTION     x3  . CYSTOSCOPY N/A 09/29/2017   Procedure: CYSTOSCOPY;  Surgeon: Vena Austria, MD;  Location: [facility];  Service: Gynecology;  Laterality: N/A;  . CYSTOSCOPY N/A 11/05/2017   Procedure: CYSTOSCOPY;  Surgeon: Vena Austria, MD;  Location: [facility];  Service: Gynecology;  Laterality: N/A;  . HYSTEROSCOPY WITH D & C N/A  09/01/2017   Procedure: DILATATION AND CURETTAGE /HYSTEROSCOPY;  Surgeon: Vena Austria, MD;  Location: [facility];  Service: Gynecology;  Laterality: N/A;  . LAPAROSCOPY N/A 09/01/2017   Procedure: LAPAROSCOPY OPERATIVE with biopsy;  Surgeon: Vena Austria, MD;  Location: [facility];  Service: Gynecology;  Laterality: N/A;  . LAPAROSCOPY N/A 11/05/2017   Procedure: LAPAROSCOPY DIAGNOSTIC;  Surgeon: Vena Austria, MD;  Location: [facility];  Service: Gynecology;  Laterality: N/A;  . REPAIR VAGINAL CUFF N/A 11/05/2017   Procedure: REPAIR VAGINAL CUFF;  Surgeon: Vena Austria, MD;  Location: [facility];  Service: Gynecology;  Laterality: N/A;  . TOTAL LAPAROSCOPIC HYSTERECTOMY WITH SALPINGECTOMY Bilateral 09/29/2017   Procedure: TOTAL LAPAROSCOPIC HYSTERECTOMY WITH SALPINGECTOMY;  Surgeon: Vena Austria, MD;  Location: [facility];  Service: Gynecology;  Laterality: Bilateral;    OB History    Gravida  3   Para  3   Term  3   Preterm      AB      Living  3     SAB      IAB      Ectopic      Multiple      Live Births  3            Home Medications    Prior  to Admission medications   Medication Sig Start Date End Date Taking? Authorizing Provider  albuterol (PROVENTIL HFA;VENTOLIN HFA) 108 (90 Base) MCG/ACT inhaler Inhale 2 puffs into the lungs every 6 (six) hours as needed for wheezing or shortness of breath.  01/03/15   [provider]  albuterol (VENTOLIN HFA) 108 (90 Base) MCG/ACT inhaler Inhale 2 puffs into the lungs every 6 (six) hours as needed for wheezing or shortness of breath. 09/19/18   Darci CurrentBrown, Pettit N, MD  cetirizine (ZYRTEC) 10 MG tablet Take 10 mg by mouth daily. 01/02/20   [provider]  EPIPEN 2-PAK 0.3 MG/0.3ML SOAJ injection INJECT INTO THIGH MUSCLE THROUGH CLOTHES AS NEEDED SEVERE ALLERGIC REACTION 01/29/15   [provider]  fluconazole (DIFLUCAN) 150 MG tablet Take 150 mg by mouth every 3 (three) days. 01/23/20    [provider]  fluticasone (FLONASE) 50 MCG/ACT nasal spray 1 spray by Each Nare route daily. 07/28/18 02/03/20  [provider]  hydrOXYzine (ATARAX/VISTARIL) 25 MG tablet Take 25 mg by mouth 3 (three) times daily. 01/02/20   [provider]  loratadine (CLARITIN) 5 MG/5ML syrup Take by mouth.    [provider]  methimazole (TAPAZOLE) 5 MG tablet Take by mouth. 08/31/18   [provider]  montelukast (SINGULAIR) 10 MG tablet Take 10 mg by mouth daily. 09/10/19   [provider]  Multiple Vitamins-Minerals (CENTRUM ADULTS PO) Take 1 tablet by mouth at bedtime.     [provider]  omeprazole (PRILOSEC) 20 MG capsule Take 20 mg by mouth at bedtime.  03/11/17   [provider]  ondansetron (ZOFRAN ODT) 4 MG disintegrating tablet Take 1 tablet (4 mg total) by mouth every 8 (eight) hours as needed for nausea or vomiting. 03/13/20   Tommie Samsook, Valera Vallas G, DO  phentermine (ADIPEX-P) 37.5 MG tablet Take by mouth. 10/16/19   [provider]    Family History Family History  Problem Relation Age of Onset  . Hypertension Mother   . Alcohol abuse Mother   . Deep vein thrombosis Mother   . Alcohol abuse Father   . COPD Father   . Emphysema Father   . Heart disease Father     Social History Social History   Tobacco Use  . Smoking status: Current Every Day Smoker    Packs/day: 0.50    Years: 18.00    Pack years: 9.00    Types: Cigarettes  . Smokeless tobacco: Never Used  Vaping Use  . Vaping Use: Never used  Substance Use Topics  . Alcohol use: Yes    Alcohol/week: 0.0 standard drinks    Comment: occasionally  . Drug use: Not Currently    Types: Marijuana     Allergies   Azithromycin, Clindamycin, Codeine, Meloxicam, Neurontin [gabapentin], Penicillins, Clindamycin/lincomycin, Other, Diphenhydramine hcl, and Doxycycline   Review of Systems Review of Systems Per HPI  Physical Exam Triage Vital Signs ED Triage  Vitals  Enc Vitals Group     BP 03/13/20 1710 123/90     Pulse Rate 03/13/20 1710 (!) 111     Resp 03/13/20 1710 18     Temp 03/13/20 1710 98.2 F (36.8 C)     Temp Source 03/13/20 1710 Oral     SpO2 03/13/20 1710 100 %     Weight --      Height --      Head Circumference --      Peak Flow --      Pain Score 03/13/20  1657 0     Pain Loc --      Pain Edu? --      Excl. in GC? --    Updated Vital Signs BP 123/90 (BP Location: Left Arm)   Pulse (!) 111   Temp 98.2 F (36.8 C) (Oral)   Resp 18   LMP 08/22/2017 Comment: says she just spots.  SpO2 100%   Visual Acuity Right Eye Distance:   Left Eye Distance:   Bilateral Distance:    Right Eye Near:   Left Eye Near:    Bilateral Near:     Physical Exam Constitutional:      General: She is not in acute distress.    Appearance: Normal appearance. She is not ill-appearing.  HENT:     Head: Normocephalic and atraumatic.     Ears:     Comments: Left TM with scarring. Eyes:     General:        Right eye: No discharge.        Left eye: No discharge.     Conjunctiva/sclera: Conjunctivae normal.  Cardiovascular:     Rate and Rhythm: Regular rhythm. Tachycardia present.  Pulmonary:     Effort: Pulmonary effort is normal.     Breath sounds: Normal breath sounds. No wheezing or rales.  Neurological:     Mental Status: She is alert.  Psychiatric:        Mood and Affect: Mood normal.        Behavior: Behavior normal.    UC Treatments / Results  Labs (all labs ordered are listed, but only abnormal results are displayed) Labs Reviewed  RESP PANEL BY RT-PCR (FLU A&B, COVID) ARPGX2 - Abnormal; Notable for the following components:      Result Value   SARS Coronavirus 2 by RT PCR POSITIVE (*)    All other components within normal limits  CBC WITH DIFFERENTIAL/PLATELET - Abnormal; Notable for the following components:   RBC 5.48 (*)    Hemoglobin 16.4 (*)    HCT 49.4 (*)    All other components within normal limits   COMPREHENSIVE METABOLIC PANEL - Abnormal; Notable for the following components:   Potassium 3.4 (*)    All other components within normal limits  LIPASE, BLOOD    EKG   Radiology No results found.  Procedures Procedures (including critical care time)  Medications Ordered in UC Medications - No data to display  Initial Impression / Assessment and Plan / UC Course  I have reviewed the triage vital signs and the nursing notes.  Pertinent labs & imaging results that were available during my care of the patient were reviewed by me and considered in my medical decision making (see chart for details).    34 year old female presents with COVID-19.  Zofran for nausea.  Information sent to the infusion clinic.  Supportive care.  Work note given.  Final Clinical Impressions(s) / UC Diagnoses   Final diagnoses:  COVID     Discharge Instructions     Rest.  Lots of fluids. Medication as needed.  I will reach out to the infusion clinic.  Take care  Dr. Adriana Simas     ED Prescriptions    Medication Sig Dispense Auth. Provider   ondansetron (ZOFRAN ODT) 4 MG disintegrating tablet Take 1 tablet (4 mg total) by mouth every 8 (eight) hours as needed for nausea or vomiting. 20 tablet Tommie Sams, DO     PDMP not reviewed this encounter.   Adriana Simas,  Verdis Frederickson, DO 03/13/20 2000

## 2020-03-13 NOTE — ED Triage Notes (Signed)
Pt is here with emesis and body aches for 3 days now , pt has taken Tylenol to relieve discomfort.

## 2020-03-13 NOTE — Discharge Instructions (Signed)
Rest.  Lots of fluids. Medication as needed.  I will reach out to the infusion clinic.  Take care  Dr. Adriana Simas

## 2020-03-14 ENCOUNTER — Telehealth: Payer: Self-pay | Admitting: Unknown Physician Specialty

## 2020-03-14 ENCOUNTER — Other Ambulatory Visit (HOSPITAL_COMMUNITY): Payer: Self-pay | Admitting: Physician Assistant

## 2020-03-14 ENCOUNTER — Encounter: Payer: Self-pay | Admitting: Physician Assistant

## 2020-03-14 NOTE — Progress Notes (Signed)
I connected by phone with Elizabeth Spencer on 03/14/2020 at 4:35 PM to discuss the potential use of a new treatment for mild to moderate COVID-19 viral infection in non-hospitalized patients.  This patient is a 34 y.o. female that meets the FDA criteria for Emergency Use Authorization of COVID monoclonal antibody casirivimab/imdevimab, bamlanivimab/eteseviamb, or sotrovimab.  Has a (+) direct SARS-CoV-2 viral test result  Has mild or moderate COVID-19   Is NOT hospitalized due to COVID-19  Is within 10 days of symptom onset  Has at least one of the high risk factor(s) for progression to severe COVID-19 and/or hospitalization as defined in EUA.  Specific high risk criteria : BMI > 25   I have spoken and communicated the following to the patient or parent/caregiver regarding COVID monoclonal antibody treatment:  1. FDA has authorized the emergency use for the treatment of mild to moderate COVID-19 in adults and pediatric patients with positive results of direct SARS-CoV-2 viral testing who are 25 years of age and older weighing at least 40 kg, and who are at high risk for progressing to severe COVID-19 and/or hospitalization.  2. The significant known and potential risks and benefits of COVID monoclonal antibody, and the extent to which such potential risks and benefits are unknown.  3. Information on available alternative treatments and the risks and benefits of those alternatives, including clinical trials.  4. Patients treated with COVID monoclonal antibody should continue to self-isolate and use infection control measures (e.g., wear mask, isolate, social distance, avoid sharing personal items, clean and disinfect "high touch" surfaces, and frequent handwashing) according to CDC guidelines.   5. The patient or parent/caregiver has the option to accept or refuse COVID monoclonal antibody treatment.  After reviewing this information with the patient, the patient has agreed to receive one of  the available covid 19 monoclonal antibodies and will be provided an appropriate fact sheet prior to infusion. Jodell Cipro, PA-C 03/14/2020 4:35 PM

## 2020-03-17 ENCOUNTER — Ambulatory Visit (HOSPITAL_COMMUNITY)
Admission: RE | Admit: 2020-03-17 | Discharge: 2020-03-17 | Disposition: A | Discharge status: Home / Self Care | Payer: Medicaid Other | Source: Ambulatory Visit | Attending: Pulmonary Disease | Admitting: Pulmonary Disease

## 2020-03-17 DIAGNOSIS — U071 COVID-19: Secondary | ICD-10-CM | POA: Diagnosis present

## 2020-03-17 MED ORDER — DIPHENHYDRAMINE HCL 50 MG/ML IJ SOLN: 50.0000 mg | Freq: Once | INTRAMUSCULAR | Status: DC | PRN

## 2020-03-17 MED ORDER — EPINEPHRINE 0.3 MG/0.3ML IJ SOAJ: 0.3000 mg | Freq: Once | INTRAMUSCULAR | Status: DC | PRN

## 2020-03-17 MED ORDER — ALBUTEROL SULFATE HFA 108 (90 BASE) MCG/ACT IN AERS
2.0000 | INHALATION_SPRAY | Freq: Once | RESPIRATORY_TRACT | Status: DC | PRN
Start: 2020-03-17 — End: 2020-03-18

## 2020-03-17 MED ORDER — METHYLPREDNISOLONE SODIUM SUCC 125 MG IJ SOLR: 125.0000 mg | Freq: Once | INTRAMUSCULAR | Status: DC | PRN

## 2020-03-17 MED ORDER — SODIUM CHLORIDE 0.9 % IV SOLN: Freq: Once | INTRAVENOUS | Status: AC

## 2020-03-17 MED ORDER — FAMOTIDINE IN NACL 20-0.9 MG/50ML-% IV SOLN
20.0000 mg | Freq: Once | INTRAVENOUS | Status: DC | PRN
Start: 2020-03-17 — End: 2020-03-18

## 2020-03-17 MED ORDER — SODIUM CHLORIDE 0.9 % IV SOLN: INTRAVENOUS | Status: DC | PRN

## 2020-03-17 NOTE — Telephone Encounter (Signed)
Note in error.

## 2020-03-17 NOTE — Discharge Instructions (Signed)
10 Things You Can Do to Manage Your COVID-19 Symptoms at Home If you have possible or confirmed COVID-19: 1. Stay home from work and school. And stay away from other public places. If you must go out, avoid using any kind of public transportation, ridesharing, or taxis. 2. Monitor your symptoms carefully. If your symptoms get worse, call your healthcare provider immediately. 3. Get rest and stay hydrated. 4. If you have a medical appointment, call the healthcare provider ahead of time and tell them that you have or may have COVID-19. 5. For medical emergencies, call 911 and notify the dispatch personnel that you have or may have COVID-19. 6. Cover your cough and sneezes with a tissue or use the inside of your elbow. 7. Wash your hands often with soap and water for at least 20 seconds or clean your hands with an alcohol-based hand sanitizer that contains at least 60% alcohol. 8. As much as possible, stay in a specific room and away from other people in your home. Also, you should use a separate bathroom, if available. If you need to be around other people in or outside of the home, wear a mask. 9. Avoid sharing personal items with other people in your household, like dishes, towels, and bedding. 10. Clean all surfaces that are touched often, like counters, tabletops, and doorknobs. Use household cleaning sprays or wipes according to the label instructions. cdc.gov/coronavirus 10/04/2018 This information is not intended to replace advice given to you by your health care provider. Make sure you discuss any questions you have with your health care provider. Document Revised: 03/08/2019 Document Reviewed: 03/08/2019 Elsevier Patient Education  2020 Elsevier Inc. What types of side effects do monoclonal antibody drugs cause?  Common side effects  In general, the more common side effects caused by monoclonal antibody drugs include: . Allergic reactions, such as hives or itching . Flu-like signs and  symptoms, including chills, fatigue, fever, and muscle aches and pains . Nausea, vomiting . Diarrhea . Skin rashes . Low blood pressure   The CDC is recommending patients who receive monoclonal antibody treatments wait at least 90 days before being vaccinated.  Currently, there are no data on the safety and efficacy of mRNA COVID-19 vaccines in persons who received monoclonal antibodies or convalescent plasma as part of COVID-19 treatment. Based on the estimated half-life of such therapies as well as evidence suggesting that reinfection is uncommon in the 90 days after initial infection, vaccination should be deferred for at least 90 days, as a precautionary measure until additional information becomes available, to avoid interference of the antibody treatment with vaccine-induced immune responses. If you have any questions or concerns after the infusion please call the Advanced Practice Provider on call at [phone]. This number is ONLY intended for your use regarding questions or concerns about the infusion post-treatment side-effects.  Please do not provide this number to others for use. For return to work notes please contact your primary care provider.   If someone you know is interested in receiving treatment please have them call the COVID hotline at [phone].   

## 2020-03-17 NOTE — Progress Notes (Signed)
Patient reviewed Fact Sheet for Patients, Parents, and Caregivers for Emergency Use Authorization (EUA) of bam/ete for the Treatment of Coronavirus. Patient also reviewed and is agreeable to the estimated cost of treatment. Patient is agreeable to proceed.    

## 2020-03-17 NOTE — Progress Notes (Signed)
  Diagnosis: COVID-19  Physician: Dr. Wright  Procedure: [facility]fusion [facility] Med: bamlanivimab\etesevimab infusion - Provided patient with bamlanimivab\etesevimab fact sheet for patients, parents and caregivers prior to infusion.  Complications: No immediate complications noted.  Discharge: Discharged home   Darlette Dubow Richards 03/17/2020   

## 2020-03-26 ENCOUNTER — Ambulatory Visit
Admission: EM | Admit: 2020-03-26 | Discharge: 2020-03-26 | Disposition: A | Discharge status: Home / Self Care | Payer: Medicaid Other

## 2020-03-26 ENCOUNTER — Other Ambulatory Visit: Payer: Self-pay

## 2020-03-27 ENCOUNTER — Telehealth: Payer: Self-pay

## 2020-03-27 ENCOUNTER — Ambulatory Visit: Payer: Medicaid Other

## 2020-03-27 ENCOUNTER — Other Ambulatory Visit: Payer: Self-pay | Admitting: Obstetrics and Gynecology

## 2020-03-27 DIAGNOSIS — R300 Dysuria: Secondary | ICD-10-CM

## 2020-03-27 MED ORDER — NITROFURANTOIN MONOHYD MACRO 100 MG PO CAPS: 100.0000 mg | ORAL_CAPSULE | Freq: Two times a day (BID) | ORAL | 0 refills | Status: AC

## 2020-03-27 NOTE — Progress Notes (Signed)
Pt came to drop off clean catch urine; was only able to void a very small amt - not enough to do a long dip and a culture so a culture was drawn up; pt on pyridium as well.  Pt is miserable c burning with urination especially at the end of the stream.  Adv pt will send for culture and ask AMS for antibx.

## 2020-03-27 NOTE — Telephone Encounter (Signed)
Patient is added to nurse schedule

## 2020-03-27 NOTE — Telephone Encounter (Signed)
Pt calling; went to [facility]; dx'd c UTI; rx does nothing but make her pee really orange, doesn't seem to be working; no openings; has to go back to work tomrrow; still has UTI sxs.  [phone]  Pt has burning c urination; was given phenazopyrid.  Adv we would need a specimen; pt to come in at 4pm today to leave sample as she doesn't want to mess c UC or [facility].  Adv culture would need to grow for 48hrs, we wouldn't have results until Monday. Will do Long dip, send for c&s, send to provider for hopefully antibx to help her through the holiday.

## 2020-04-05 LAB — URINE CULTURE

## 2020-04-07 ENCOUNTER — Other Ambulatory Visit: Payer: Self-pay | Admitting: Obstetrics and Gynecology

## 2020-04-07 MED ORDER — SULFAMETHOXAZOLE-TRIMETHOPRIM 800-160 MG PO TABS: 1.0000 | ORAL_TABLET | Freq: Two times a day (BID) | ORAL | 0 refills | Status: AC

## 2020-04-07 MED ORDER — FLUCONAZOLE 150 MG PO TABS: 150.0000 mg | ORAL_TABLET | Freq: Once | ORAL | 0 refills | Status: AC

## 2020-04-07 NOTE — Progress Notes (Signed)
Klebsiella Pneumonia Rx sent for bactrim

## 2020-05-29 IMAGING — CT CT MAXILLOFACIAL W/ CM
3 series · 15 of 47 positions shown, 18 images · IV contrast (omnipaque)
Comparison: None.

CLINICAL DATA: Right jaw swelling, reported facial trauma
06/12/2019

EXAM:
CT MAXILLOFACIAL WITH CONTRAST
TECHNIQUE: Multidetector CT imaging of the maxillofacial structures was
performed with intravenous contrast. Multiplanar CT image
reconstructions were also generated.
CONTRAST:  75mL OMNIPAQUE IOHEXOL 300 MG/ML  SOLN

[Series 2: max soft · axial · 0.34mm/px · z∈[-256,-108]mm · 9 of 86 slices shown, 12 images]
[im 6/86  brain]
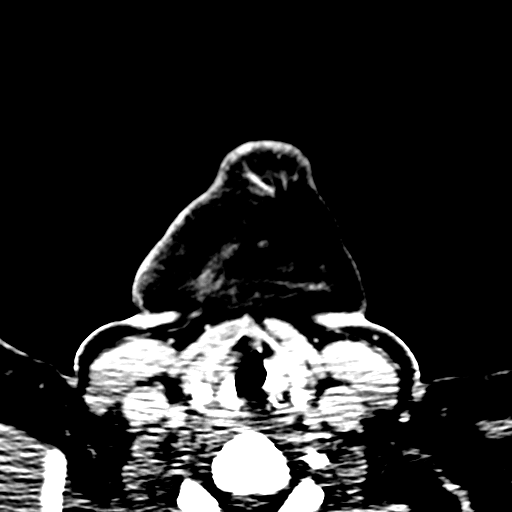
[im 6/86  bone]
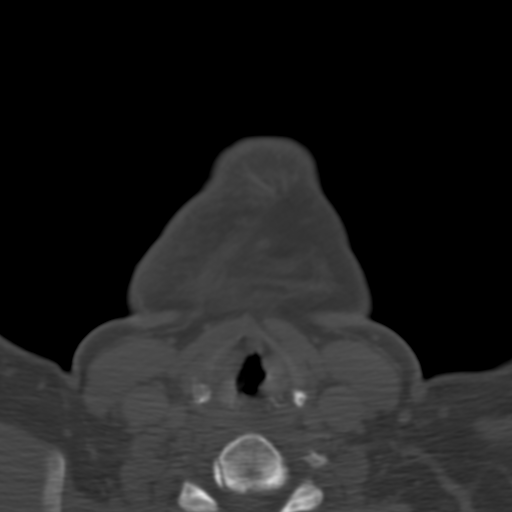
[im 15/86  bone]
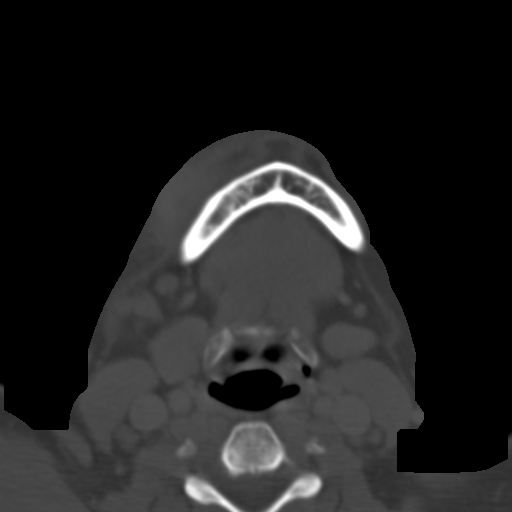
[im 24/86  bone]
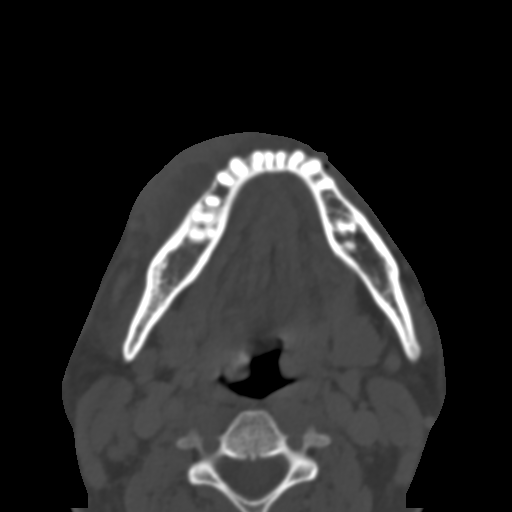
[im 33/86  bone]
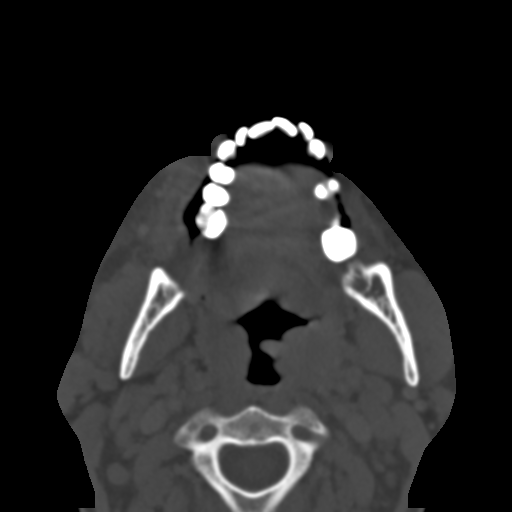
[im 44/86  brain]
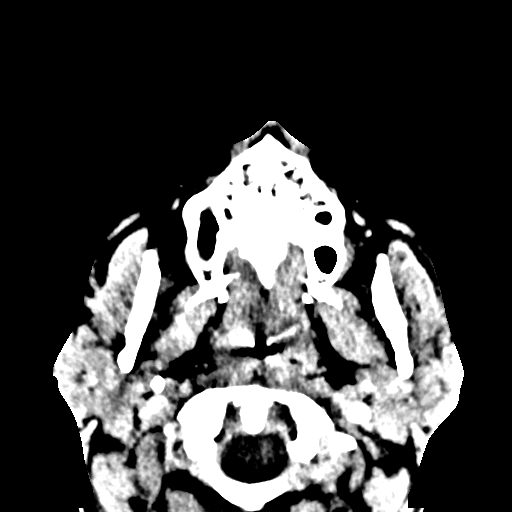
[im 44/86  bone]
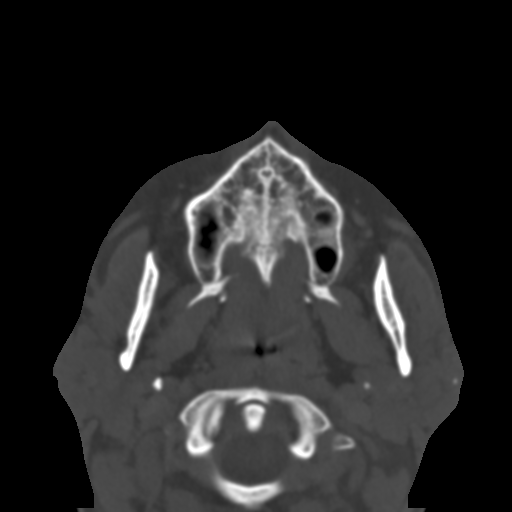
[im 53/86  bone]
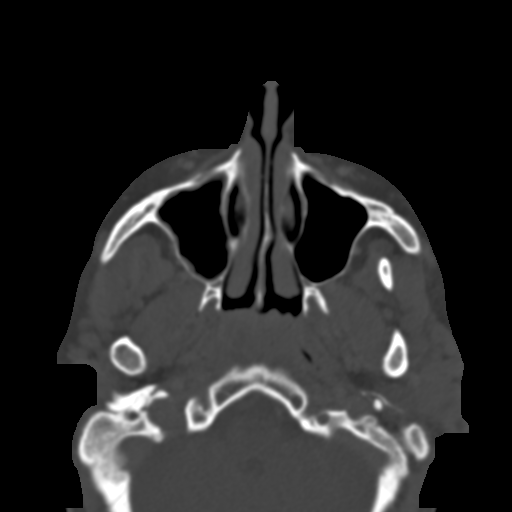
[im 62/86  bone]
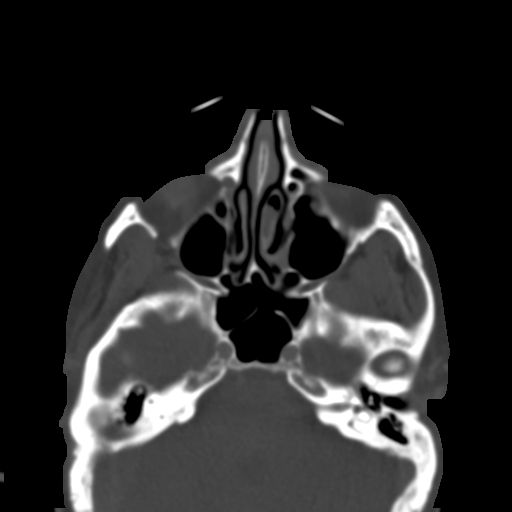
[im 71/86  bone]
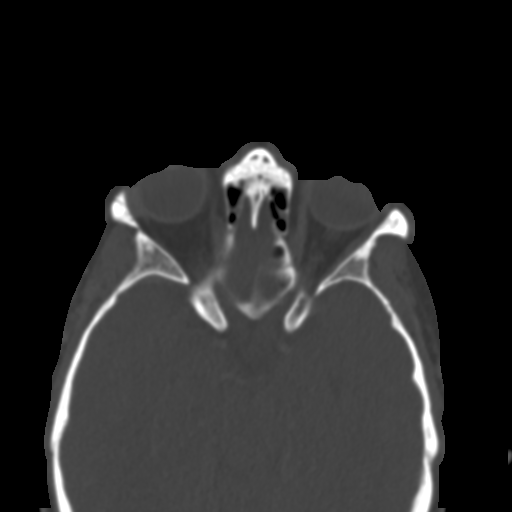
[im 80/86  brain]
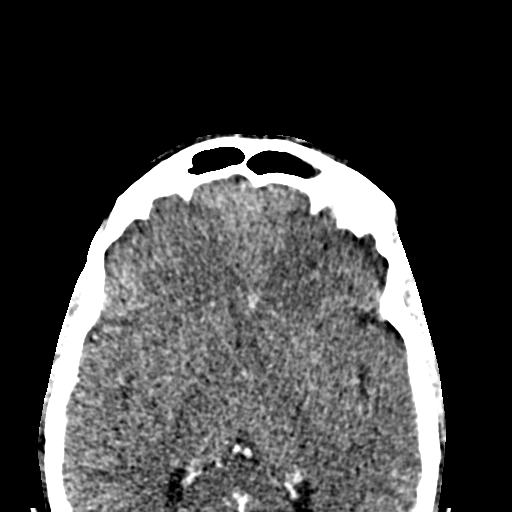
[im 80/86  bone]
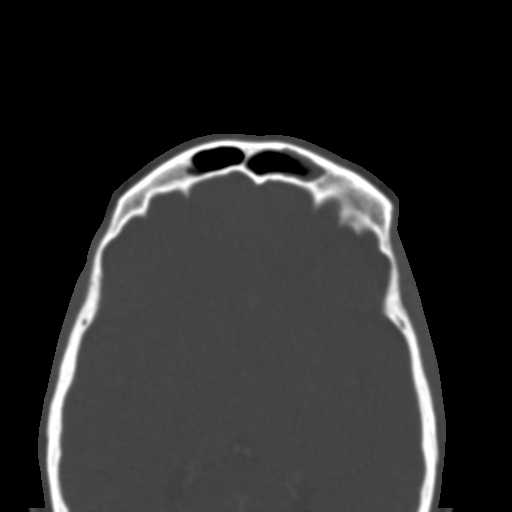

[Series 4: coronal soft · coronal · 0.32mm/px · 3 of 69 slices shown]
[im 23/69  bone]
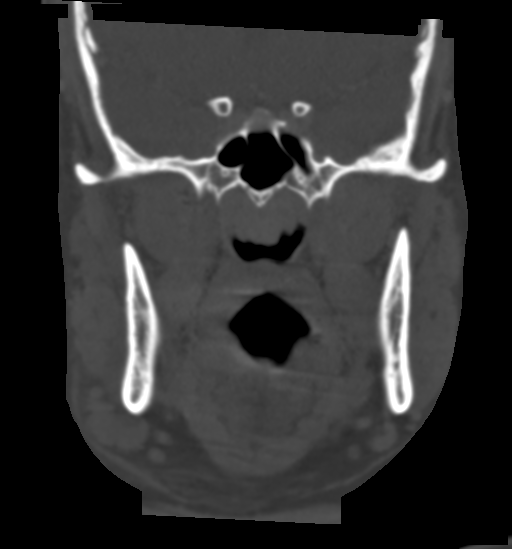
[im 31/69  bone]
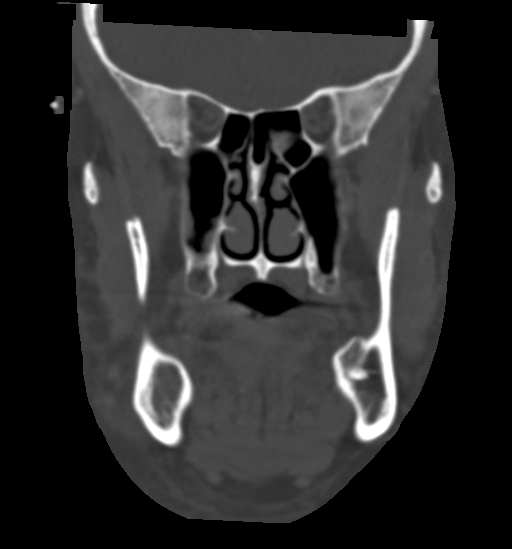
[im 38/69  bone]
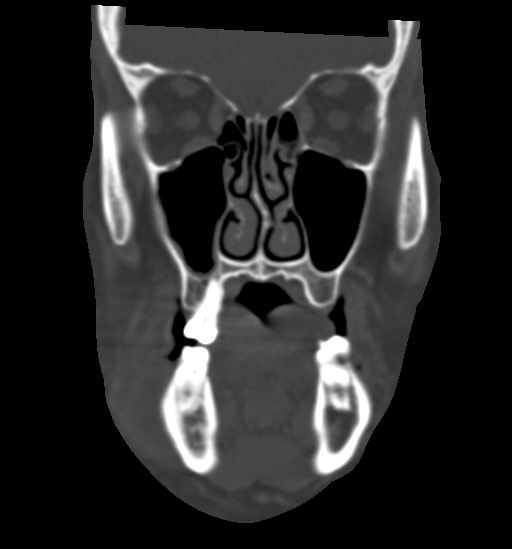

[Series 5: sagittal soft · sagittal · 0.27mm/px · 3 of 84 slices shown]
[im 28/84  bone]
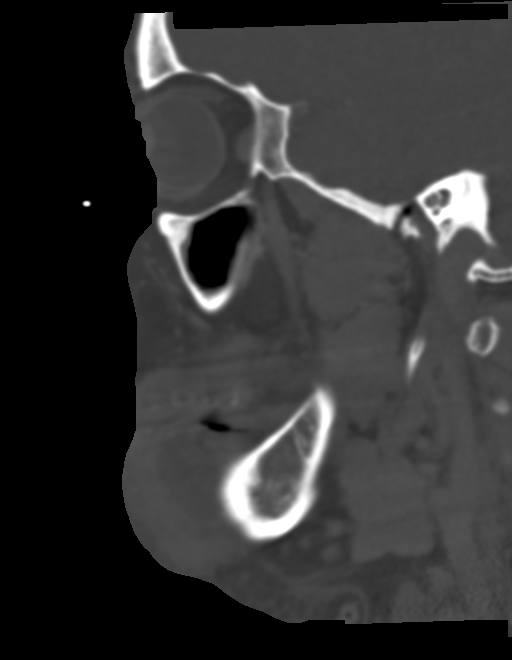
[im 42/84  bone]
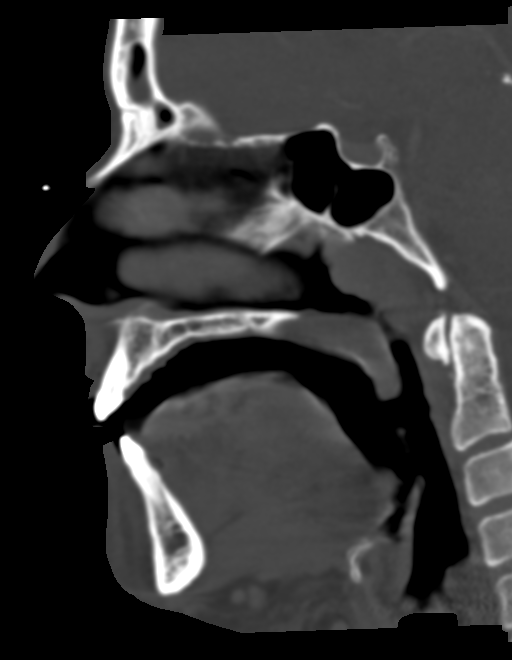
[im 56/84  bone]
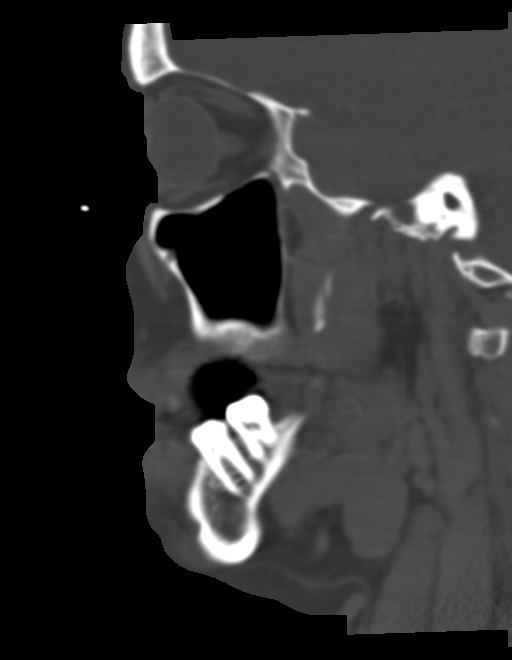

[15 of 47 positions shown; findings below may reference images not displayed]

FINDINGS: Osseous: There is periapical lucency about the posterior right
mandibular premolar contiguous with the alveolar canal.
Temporomandibular joints are unremarkable.

Orbits: Unremarkable.

Sinuses: Patchy mucosal thickening.

Soft tissues: There is superficial and deep soft tissue swelling
along the body of the mandible on the right. Mildly enlarged, likely
reactive right submandibular node measuring 1.9 cm. Submandibular
glands are unremarkable. There are multiple small calculi within the
left parotid gland. Prominence of the adenoids and palatine tonsils.

Limited intracranial: No abnormal enhancement.
IMPRESSION: Inflammatory changes along the body of the mandible on the right
extending from skin surface to buccal surface of the mandible.
Appears to reflect phlegmon with no well-defined drainable
collection at this time. This is in proximity to periapical lucency
about the posterior right mandibular premolar and therefore most
likely odontogenic.

## 2020-06-20 DIAGNOSIS — A549 Gonococcal infection, unspecified: Secondary | ICD-10-CM

## 2020-06-20 DIAGNOSIS — A5901 Trichomonal vulvovaginitis: Secondary | ICD-10-CM

## 2020-06-20 HISTORY — DX: Gonococcal infection, unspecified: A54.9

## 2020-06-20 HISTORY — DX: Trichomonal vulvovaginitis: A59.01

## 2020-06-23 ENCOUNTER — Telehealth: Payer: Self-pay

## 2020-06-23 NOTE — Telephone Encounter (Signed)
Pt called after hour nurse 06/21/20 2:39pm c/o wanting to speak c AMS; she tested positive for gonorrhea and trich and got a med for the trich and she wants to ask if something other than pcn can be called in for the gonorrhea b/c she is very allergic to it.  CRS adv there are other alternatives that the pt can be given for gonorrhea besides the pcn.  Due to other allergies, they are having to be carefull with what is ordered but they will be able to find something even if it might involve her coming in for some observation while getting the medication. Pt aware and states she will contact [facility] Monday to see what antibx they are going to use.

## 2020-07-15 ENCOUNTER — Ambulatory Visit (INDEPENDENT_AMBULATORY_CARE_PROVIDER_SITE_OTHER): Payer: Medicaid Other | Admitting: Obstetrics and Gynecology

## 2020-07-15 ENCOUNTER — Other Ambulatory Visit: Payer: Self-pay

## 2020-07-15 ENCOUNTER — Encounter: Payer: Self-pay | Admitting: Obstetrics and Gynecology

## 2020-07-15 VITALS — BP 118/80 | Ht 67.0 in | Wt 206.0 lb

## 2020-07-15 DIAGNOSIS — Z113 Encounter for screening for infections with a predominantly sexual mode of transmission: Secondary | ICD-10-CM | POA: Diagnosis not present

## 2020-07-15 DIAGNOSIS — Z7251 High risk heterosexual behavior: Secondary | ICD-10-CM | POA: Diagnosis not present

## 2020-07-15 DIAGNOSIS — R238 Other skin changes: Secondary | ICD-10-CM

## 2020-07-15 NOTE — Progress Notes (Signed)
Obstetrics & Gynecology Office Visit   Chief Complaint:  Chief Complaint  Patient presents with  . Exposure to STD    Want STD testing again, was seen at [facility] for testing in the last 4 wks, positive for herpes, trichomonas, gonorrhea, chlamydia. RM 4      History of Present Illness: 35 y.o. presenting with a pustular rash that started several weeks ago.  She reports that the rash isn't particularly pruritic.  Nobody else in the household has had a similar rash including her 50 year old child.  No changes in soaps or detergents.  She did have upper respiratory symptoms preceding onset of rash.  Was evaluated by PCP with negative HSV, RPR.  Her gonorrhea and trichomonas was positive and she was treated for these.  In addition she had positive HSV serologies.     Review of Systems: Review of Systems  Constitutional: Negative.   Gastrointestinal: Negative.   Skin: Positive for rash. Negative for itching.     Past Medical History:  Past Medical History:  Diagnosis Date  . Arthritis   . Asthma    WELL CONTROLLED  . Brain aneurysm 2007   NEUROLOGY NOTE DOES NOT MENTION ANEURYSM BUT PT STATES SHE DID NOT HAVE TO HAVE SURGERY  . Complication of anesthesia    FOR 1 C-SECTION PT WAS ITCHING AND VERY RED ON HER FACE  . Family history of adverse reaction to anesthesia    brother, neice and nephew got red in face with hives  . Gallstones   . GERD (gastroesophageal reflux disease)   . Headache    CHRONIC HEADACHES  . History of chronic cough    DRY  . Pneumonia   . PTSD (post-traumatic stress disorder)   . PTSD (post-traumatic stress disorder)   . Thyroid condition    PT WAS JUST TOLD ON 08-25-17 THAT SHE HAS A THYROID PROBLEM AND IS GOING TO F/U WITH ENDOCRINOLOGIST IN 2 WEEKS    Past Surgical History:  Past Surgical History:  Procedure Laterality Date  . ABDOMINAL HYSTERECTOMY    . CESAREAN SECTION     x3  . CYSTOSCOPY N/A 09/29/2017   Procedure: CYSTOSCOPY;  Surgeon:  Vena Austria, MD;  Location: [facility];  Service: Gynecology;  Laterality: N/A;  . CYSTOSCOPY N/A 11/05/2017   Procedure: CYSTOSCOPY;  Surgeon: Vena Austria, MD;  Location: [facility];  Service: Gynecology;  Laterality: N/A;  . HYSTEROSCOPY WITH D & C N/A 09/01/2017   Procedure: DILATATION AND CURETTAGE /HYSTEROSCOPY;  Surgeon: Vena Austria, MD;  Location: [facility];  Service: Gynecology;  Laterality: N/A;  . LAPAROSCOPY N/A 09/01/2017   Procedure: LAPAROSCOPY OPERATIVE with biopsy;  Surgeon: Vena Austria, MD;  Location: [facility];  Service: Gynecology;  Laterality: N/A;  . LAPAROSCOPY N/A 11/05/2017   Procedure: LAPAROSCOPY DIAGNOSTIC;  Surgeon: Vena Austria, MD;  Location: [facility];  Service: Gynecology;  Laterality: N/A;  . REPAIR VAGINAL CUFF N/A 11/05/2017   Procedure: REPAIR VAGINAL CUFF;  Surgeon: Vena Austria, MD;  Location: [facility];  Service: Gynecology;  Laterality: N/A;  . TOTAL LAPAROSCOPIC HYSTERECTOMY WITH SALPINGECTOMY Bilateral 09/29/2017   Procedure: TOTAL LAPAROSCOPIC HYSTERECTOMY WITH SALPINGECTOMY;  Surgeon: Vena Austria, MD;  Location: [facility];  Service: Gynecology;  Laterality: Bilateral;    Gynecologic History: Patient's last menstrual period was 08/22/2017.  Obstetric History: [id number]  Family History:  Family History  Problem Relation Age of Onset  . Hypertension Mother   . Alcohol abuse Mother   . Deep  vein thrombosis Mother   . Alcohol abuse Father   . COPD Father   . Emphysema Father   . Heart disease Father     Social History:  Social History   Socioeconomic History  . Marital status: Single    Spouse name: Not on file  . Number of children: Not on file  . Years of education: Not on file  . Highest education level: Not on file  Occupational History  . Not on file  Tobacco Use  . Smoking status: Current Every Day Smoker    Packs/day: 0.50    Years: 18.00    Pack years: 9.00    Types: Cigarettes  . Smokeless tobacco:  Never Used  Vaping Use  . Vaping Use: Never used  Substance and Sexual Activity  . Alcohol use: Yes    Alcohol/week: 0.0 standard drinks    Comment: occasionally  . Drug use: Not Currently    Types: Marijuana  . Sexual activity: Yes    Partners: Male    Birth control/protection: Surgical    Comment: Hysterectomy  Other Topics Concern  . Not on file  Social History Narrative  . Not on file   Social Determinants of Health   Financial Resource Strain: Not on file  Food Insecurity: Not on file  Transportation Needs: Not on file  Physical Activity: Not on file  Stress: Not on file  Social Connections: Not on file  Intimate Partner Violence: Not on file    Allergies:  Allergies  Allergen Reactions  . Azithromycin Swelling and Other (See Comments)  . Clindamycin Swelling  . Codeine Hives and Other (See Comments)    Nausea/dizzy Nausea/dizzy  . Meloxicam Other (See Comments)    Burns up from the inside   . Neurontin [Gabapentin] Other (See Comments)    Makes her pass out and not remember what happened prior to taking med.  Marland Kitchen Penicillins Anaphylaxis    Patient allergic to all "cillins" Has patient had a PCN reaction causing immediate rash, facial/tongue/throat swelling, SOB or lightheadedness with hypotension: Yes Has patient had a PCN reaction causing severe rash involving mucus membranes or skin necrosis: Yes Has patient had a PCN reaction that required hospitalization: No Has patient had a PCN reaction occurring within the last 10 years: Yes If all of the above answers are "NO", then may proceed with Cephalosporin use.   . Clindamycin/Lincomycin Swelling  . Other Hives and Other (See Comments)    Berry flavored food and drinks  . Diphenhydramine Hcl Rash and Other (See Comments)  . Doxycycline Rash    Medications: Prior to Admission medications   Medication Sig Start Date End Date Taking? Authorizing Provider  albuterol (PROVENTIL HFA;VENTOLIN HFA) 108 (90 Base)  MCG/ACT inhaler Inhale 2 puffs into the lungs every 6 (six) hours as needed for wheezing or shortness of breath.  01/03/15   [provider]  albuterol (VENTOLIN HFA) 108 (90 Base) MCG/ACT inhaler Inhale 2 puffs into the lungs every 6 (six) hours as needed for wheezing or shortness of breath. 09/19/18   Darci Current, MD  cetirizine (ZYRTEC) 10 MG tablet Take 10 mg by mouth daily. 01/02/20   [provider]  EPIPEN 2-PAK 0.3 MG/0.3ML SOAJ injection INJECT INTO THIGH MUSCLE THROUGH CLOTHES AS NEEDED SEVERE ALLERGIC REACTION 01/29/15   [provider]  fluconazole (DIFLUCAN) 150 MG tablet Take 150 mg by mouth every 3 (three) days. 01/23/20   [provider]  fluticasone (FLONASE) 50 MCG/ACT nasal spray  1 spray by Each Nare route daily. 07/28/18 02/03/20  [provider]  hydrOXYzine (ATARAX/VISTARIL) 25 MG tablet Take 25 mg by mouth 3 (three) times daily. 01/02/20   [provider]  loratadine (CLARITIN) 5 MG/5ML syrup Take by mouth.    [provider]  methimazole (TAPAZOLE) 5 MG tablet Take by mouth. 08/31/18   [provider]  montelukast (SINGULAIR) 10 MG tablet Take 10 mg by mouth daily. 09/10/19   [provider]  Multiple Vitamins-Minerals (CENTRUM ADULTS PO) Take 1 tablet by mouth at bedtime.     [provider]  omeprazole (PRILOSEC) 20 MG capsule Take 20 mg by mouth at bedtime.  03/11/17   [provider]  ondansetron (ZOFRAN ODT) 4 MG disintegrating tablet Take 1 tablet (4 mg total) by mouth every 8 (eight) hours as needed for nausea or vomiting. 03/13/20   Tommie Sams, DO  phentermine (ADIPEX-P) 37.5 MG tablet Take by mouth. 10/16/19   [provider]    Physical Exam Vitals:  Vitals:   07/15/20 1438  BP: 118/80   Patient's last menstrual period was 08/22/2017.  General: NAD, well nourished, appears stated HEENT: normocephalic, anicteric, no oral lesions Pulmonary: No increased  work of breathing Skin: There is a disseminated papular rash covering trunk and extremities.  Hand and feet free of lesions.  No oral lesions.   Extremities: no edema, erythema, or tenderness Neurologic: Grossly intact Psychiatric: mood appropriate, affect full  Female chaperone present for pelvic  portions of the physical exam  Assessment: 35 y.o. [id number] with rash and requesting STD screening  Plan: Problem List Items Addressed This Visit   None   Visit Diagnoses    Screen for STD (sexually transmitted disease)    -  Primary   Relevant Orders   RPR   Varicella-zoster by PCR   Varicella zoster antibody, IgM   High risk heterosexual behavior       Relevant Orders   RPR   Varicella-zoster by PCR   Varicella zoster antibody, IgM   Vesicular rash       Relevant Orders   RPR   Varicella-zoster by PCR   Varicella zoster antibody, IgM     1) Papular rash - DDX varicella, seconary syphillis, disseminated gonoccocus, pityriasis rosea or scabies.  Will obtain varicella PCR and IgM  - negative RPR and HIV at PCP - reports not having had varicella previously - sparing of palms, dorsal aspect of hands, clear webspaces not consistent with scabies - given minimally itchy pityriasis rosea high in differnentail  2) Recent positive trichomonas and gonorrhea treated at PCP.  Discussed that generally does not require test of cure per CDC but if desire would need to wait at least 4 weeks from date of treatment before collecting repeat to avoid false positive and unecessary re-treatment.    3) HSV serologies - history of herpes labialis, discussed serologies can not distinguish between oral or labial HSV infection.  HSV-1 and 2 can be present at either site.  4) Return in about 4 weeks (around 08/12/2020) for follow up .   Vena Austria, MD, [facility], [facility]   ADDENDUM: Patient unable to have labs drawn secondary to her Medicaid stipulating labs be done  through PCP.

## 2020-11-05 ENCOUNTER — Ambulatory Visit: Payer: Medicaid Other | Admitting: Physical Therapy

## 2020-11-12 ENCOUNTER — Ambulatory Visit: Payer: Medicaid Other | Attending: Family | Admitting: Physical Therapy

## 2020-11-12 ENCOUNTER — Ambulatory Visit: Payer: Medicaid Other | Admitting: Physical Therapy

## 2020-11-17 ENCOUNTER — Encounter: Payer: Medicaid Other | Admitting: Physical Therapy

## 2020-11-19 ENCOUNTER — Emergency Department
Admission: EM | Admit: 2020-11-19 | Discharge: 2020-11-19 | Disposition: A | Discharge status: Home / Self Care | Payer: Medicaid Other | Attending: Emergency Medicine | Admitting: Emergency Medicine

## 2020-11-19 ENCOUNTER — Other Ambulatory Visit: Payer: Self-pay

## 2020-11-19 ENCOUNTER — Encounter: Payer: Medicaid Other | Admitting: Physical Therapy

## 2020-11-19 DIAGNOSIS — J45909 Unspecified asthma, uncomplicated: Secondary | ICD-10-CM | POA: Diagnosis not present

## 2020-11-19 DIAGNOSIS — T7840XA Allergy, unspecified, initial encounter: Secondary | ICD-10-CM | POA: Diagnosis present

## 2020-11-19 DIAGNOSIS — F17210 Nicotine dependence, cigarettes, uncomplicated: Secondary | ICD-10-CM | POA: Insufficient documentation

## 2020-11-19 DIAGNOSIS — R0602 Shortness of breath: Secondary | ICD-10-CM | POA: Diagnosis not present

## 2020-11-19 DIAGNOSIS — Z7951 Long term (current) use of inhaled steroids: Secondary | ICD-10-CM | POA: Insufficient documentation

## 2020-11-19 LAB — BASIC METABOLIC PANEL
Anion gap: 9 (ref 5–15)
BUN: 5 mg/dL — ABNORMAL LOW (ref 6–20)
CO2: 24 mmol/L (ref 22–32)
Calcium: 9 mg/dL (ref 8.9–10.3)
Chloride: 102 mmol/L (ref 98–111)
Creatinine, Ser: 0.73 mg/dL (ref 0.44–1.00)
GFR, Estimated: >60 mL/min (ref >60)
Glucose, Bld: 92 mg/dL (ref 70–99)
Potassium: 3.6 mmol/L (ref 3.5–5.1)
Sodium: 135 mmol/L (ref 135–145)

## 2020-11-19 LAB — CBC
HCT: 43.7 % (ref 36.0–46.0)
Hemoglobin: 14.9 g/dL (ref 12.0–15.0)
MCH: 31.7 pg (ref 26.0–34.0)
MCHC: 34.1 g/dL (ref 30.0–36.0)
MCV: 93 fL (ref 80.0–100.0)
Platelets: 239 10*3/uL (ref 150–400)
RBC: 4.7 MIL/uL (ref 3.87–5.11)
RDW: 12.7 % (ref 11.5–15.5)
WBC: 19.8 10*3/uL — ABNORMAL HIGH (ref 4.0–10.5)
nRBC: 0 % (ref 0.0–0.2)

## 2020-11-19 MED ORDER — METHYLPREDNISOLONE SODIUM SUCC 125 MG IJ SOLR
INTRAMUSCULAR | Status: AC
  Administered 2020-11-19: 125 mg via INTRAVENOUS
  Filled 2020-11-19: qty 2

## 2020-11-19 MED ORDER — FAMOTIDINE IN NACL 20-0.9 MG/50ML-% IV SOLN
20.0000 mg | Freq: Once | INTRAVENOUS | Status: AC
  Administered 2020-11-19: 20 mg via INTRAVENOUS
  Filled 2020-11-19: qty 50

## 2020-11-19 MED ORDER — METHYLPREDNISOLONE SODIUM SUCC 125 MG IJ SOLR: 125.0000 mg | Freq: Once | INTRAMUSCULAR | Status: AC

## 2020-11-19 NOTE — ED Triage Notes (Signed)
Pt to ED for possible allergic rxn after having peach drink last night. States last time she just had hives but unsure if she is allergic. Reports difficulty eating and drinking, but is able to swallow. No swelling noted to face or mouth.  RR even and unlabored.  Pt drinking water in triage  Berry flavored food and drinks listed in allergies.Marland Kitchen

## 2020-11-19 NOTE — ED Provider Notes (Signed)
[facility] Emergency Department Provider Note ____________________________________________   Event Date/Time   First MD Initiated Contact with Patient 11/19/20 1819     (approximate)  I have reviewed the triage vital signs and the nursing notes.   HISTORY  Chief Complaint Allergic Reaction    HPI Elizabeth Spencer is a 35 y.o. 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  She states that subsequently she started develop tightness in her throat and felt like it was closing.  She had some shortness of breath as well.  However she did not think it was serious enough to to use her EpiPen and wanted to ride out the symptoms during the night.  However, this morning, when she woke up, the symptoms were worse and she had a friend bring her to the ED.  She still did not take her EpiPen.  She received Solu-Medrol and Pepcid in triage and states that her symptoms have subsequently resolved over the last 2 hours.  She feels fine and wants to go home.  Past Medical History:  Diagnosis Date   Arthritis    Asthma    WELL CONTROLLED   Brain aneurysm 2007   NEUROLOGY NOTE DOES NOT MENTION ANEURYSM BUT PT STATES SHE DID NOT HAVE TO HAVE SURGERY   Complication of anesthesia    FOR 1 C-SECTION PT WAS ITCHING AND VERY RED ON HER FACE   Family history of adverse reaction to anesthesia    brother, neice and nephew got red in face with hives   Gallstones    GERD (gastroesophageal reflux disease)    Headache    CHRONIC HEADACHES   History of chronic cough    DRY   Pneumonia    PTSD (post-traumatic stress disorder)    PTSD (post-traumatic stress disorder)    Thyroid condition    PT WAS JUST TOLD ON 08-25-17 THAT SHE HAS A THYROID PROBLEM AND IS GOING TO F/U WITH ENDOCRINOLOGIST IN 2 WEEKS    Patient Active Problem  List   Diagnosis Date Noted   Vaginal cuff dehiscence 11/05/2017   S/P laparoscopic hysterectomy 09/29/2017    Past Surgical History:  Procedure Laterality Date   ABDOMINAL HYSTERECTOMY     CESAREAN SECTION     x3   CYSTOSCOPY N/A 09/29/2017   Procedure: CYSTOSCOPY;  Surgeon: Vena Austria, MD;  Location: [facility];  Service: Gynecology;  Laterality: N/A;   CYSTOSCOPY N/A 11/05/2017   Procedure: CYSTOSCOPY;  Surgeon: Vena Austria, MD;  Location: [facility];  Service: Gynecology;  Laterality: N/A;   HYSTEROSCOPY WITH D & C N/A 09/01/2017   Procedure: DILATATION AND CURETTAGE /HYSTEROSCOPY;  Surgeon: Vena Austria, MD;  Location: [facility];  Service: Gynecology;  Laterality: N/A;   LAPAROSCOPY N/A 09/01/2017   Procedure: LAPAROSCOPY OPERATIVE with biopsy;  Surgeon: Vena Austria, MD;  Location: [facility];  Service: Gynecology;  Laterality: N/A;   LAPAROSCOPY N/A 11/05/2017   Procedure: LAPAROSCOPY DIAGNOSTIC;  Surgeon: Vena Austria, MD;  Location: [facility];  Service: Gynecology;  Laterality: N/A;   REPAIR VAGINAL CUFF N/A 11/05/2017   Procedure: REPAIR VAGINAL CUFF;  Surgeon: Vena Austria, MD;  Location: [facility];  Service: Gynecology;  Laterality: N/A;   TOTAL LAPAROSCOPIC HYSTERECTOMY WITH SALPINGECTOMY Bilateral 09/29/2017   Procedure: TOTAL LAPAROSCOPIC HYSTERECTOMY WITH SALPINGECTOMY;  Surgeon: Vena Austria, MD;  Location:  [facility];  Service: Gynecology;  Laterality: Bilateral;    Prior to Admission medications   Medication Sig Start Date End Date Taking? Authorizing Provider  albuterol (PROVENTIL HFA;VENTOLIN HFA) 108 (90 Base) MCG/ACT inhaler Inhale 2 puffs into the lungs every 6 (six) hours as needed for wheezing or shortness of breath.  01/03/15   [provider]  albuterol (VENTOLIN HFA) 108 (90 Base) MCG/ACT inhaler Inhale 2 puffs into the lungs every 6 (six) hours as needed for wheezing or shortness of breath. 09/19/18   Darci Current, MD   cetirizine (ZYRTEC) 10 MG tablet Take 10 mg by mouth daily. 01/02/20   [provider]  EPIPEN 2-PAK 0.3 MG/0.3ML SOAJ injection INJECT INTO THIGH MUSCLE THROUGH CLOTHES AS NEEDED SEVERE ALLERGIC REACTION 01/29/15   [provider]  fluconazole (DIFLUCAN) 150 MG tablet Take 150 mg by mouth every 3 (three) days. 01/23/20   [provider]  fluticasone (FLONASE) 50 MCG/ACT nasal spray 1 spray by Each Nare route daily. 07/28/18 02/03/20  [provider]  hydrOXYzine (ATARAX/VISTARIL) 25 MG tablet Take 25 mg by mouth 3 (three) times daily. 01/02/20   [provider]  loratadine (CLARITIN) 5 MG/5ML syrup Take by mouth.    [provider]  methimazole (TAPAZOLE) 5 MG tablet Take by mouth. 08/31/18   [provider]  montelukast (SINGULAIR) 10 MG tablet Take 10 mg by mouth daily. 09/10/19   [provider]  Multiple Vitamins-Minerals (CENTRUM ADULTS PO) Take 1 tablet by mouth at bedtime.     [provider]  omeprazole (PRILOSEC) 20 MG capsule Take 20 mg by mouth at bedtime.  03/11/17   [provider]  ondansetron (ZOFRAN ODT) 4 MG disintegrating tablet Take 1 tablet (4 mg total) by mouth every 8 (eight) hours as needed for nausea or vomiting. 03/13/20   Tommie Sams, DO  phentermine (ADIPEX-P) 37.5 MG tablet Take by mouth. 10/16/19   [provider]    Allergies Azithromycin, Clindamycin, Codeine, Meloxicam, Neurontin [gabapentin], Penicillins, Clindamycin/lincomycin, Other, Diphenhydramine hcl, and Doxycycline  Family History  Problem Relation Age of Onset   Hypertension Mother    Alcohol abuse Mother    Deep vein thrombosis Mother    Alcohol abuse Father    COPD Father    Emphysema Father    Heart disease Father     Social History Social History   Tobacco Use   Smoking status: Every Day    Packs/day: 0.50    Years: 18.00    Pack years: 9.00    Types: Cigarettes   Smokeless tobacco: Never   Vaping Use   Vaping Use: Never used  Substance Use Topics   Alcohol use: Yes    Alcohol/week: 0.0 standard drinks    Comment: occasionally   Drug use: Not Currently    Types: Marijuana    Review of Systems  Constitutional: No fever/chills Eyes: No redness. ENT: Positive for resolved throat discomfort. Cardiovascular: Denies chest pain. Respiratory: Positive for resolved shortness of breath. Gastrointestinal: No nausea, no vomiting.  No diarrhea.  Genitourinary: Negative for dysuria.  Musculoskeletal: Negative for back pain. Skin: Negative for hives or rash. Neurological: Negative for headache.   ____________________________________________   PHYSICAL EXAM:  VITAL SIGNS: ED Triage Vitals  Enc Vitals Group     BP 11/19/20 1617 120/82     Pulse Rate 11/19/20 1617 (!) 122     Resp 11/19/20 1617 18     Temp 11/19/20 1616 99.6 F (37.6 C)  Temp Source 11/19/20 1616 Oral     SpO2 11/19/20 1617 97 %     Weight 11/19/20 1616 205 lb (93 kg)     Height 11/19/20 1616 5\' 7"  (1.702 m)     Head Circumference --      Peak Flow --      Pain Score 11/19/20 1616 0     Pain Loc --      Pain Edu? --      Excl. in [place]? --     Constitutional: Alert and oriented. Well appearing and in no acute distress. Eyes: Conjunctivae are normal.  Head: Atraumatic. Nose: No congestion/rhinnorhea. Mouth/Throat: Mucous membranes are moist.  Oropharynx clear with no visible swelling.  No stridor.  No pooled secretions. Neck: Normal range of motion.  Cardiovascular: Normal rate, regular rhythm. Grossly normal heart sounds.  Good peripheral circulation. Respiratory: Normal respiratory effort.  No retractions. Lungs CTAB. Gastrointestinal: No distention.  Musculoskeletal: Extremities warm and well perfused.  Neurologic:  Normal speech and language. No gross focal neurologic deficits are appreciated.  Skin:  Skin is warm and dry. No urticaria or other rash noted. Psychiatric: Mood and affect are  normal. Speech and behavior are normal.  ____________________________________________   LABS (all labs ordered are listed, but only abnormal results are displayed)  Labs Reviewed  BASIC METABOLIC PANEL - Abnormal; Notable for the following components:      Result Value   BUN 5 (*)    All other components within normal limits  CBC - Abnormal; Notable for the following components:   WBC 19.8 (*)    All other components within normal limits   ____________________________________________  EKG   ____________________________________________  RADIOLOGY    ____________________________________________   PROCEDURES  Procedure(s) performed: No  Procedures  Critical Care performed: No ____________________________________________   INITIAL IMPRESSION / ASSESSMENT AND PLAN / ED COURSE  Pertinent labs & imaging results that were available during my care of the patient were reviewed by me and considered in my medical decision making (see chart for details).   35 year old female with PMH as noted above presents with concern for an apparent allergic reaction that started last night after having a hotdog and a peach.  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  She states that she is feeling fine now after having gotten Solu-Medrol and Pepcid in triage.  On exam the patient is well-appearing.  She was slightly tachycardic in triage with a borderline elevated temperature but otherwise normal vital signs.  Physical exam is unremarkable.  The oropharynx is clear.  The lungs are clear to auscultation.  There are no hives or other rash.  Basic labs were obtained from triage and are unremarkable except for leukocytosis which I suspect is likely reactive.  Overall presentation is consistent with an allergic reaction.  Given that the symptoms resolved with steroids and Pepcid and the patient has not required epinephrine  and has no ongoing symptoms, she is stable for discharge home.  Although she did have tachycardia and leukocytosis, the patient has no other focal symptoms to suggest infection and there is no clinical evidence of sepsis.  She states that she feels completely back to normal and wants to go home.  She is appropriate for discharge at this time.  I counseled her on the results of the work-up.  Return precautions given, and she expresses understanding.  She has an EpiPen at home  which has not expired.   ____________________________________________   FINAL CLINICAL IMPRESSION(S) / ED DIAGNOSES  Final diagnoses:  Allergic reaction, initial encounter      NEW MEDICATIONS STARTED DURING THIS VISIT:  New Prescriptions   No medications on file     Note:  This document was prepared using Dragon voice recognition software and may include unintentional dictation errors.    Dionne BucySiadecki, Casara Perrier, MD 11/19/20 [phone]

## 2020-11-25 ENCOUNTER — Encounter: Payer: Medicaid Other | Admitting: Physical Therapy

## 2020-11-27 ENCOUNTER — Encounter: Payer: Medicaid Other | Admitting: Physical Therapy

## 2020-12-02 ENCOUNTER — Encounter: Payer: Medicaid Other | Admitting: Physical Therapy

## 2020-12-04 ENCOUNTER — Encounter: Payer: Medicaid Other | Admitting: Physical Therapy

## 2022-05-01 ENCOUNTER — Emergency Department
Admission: EM | Admit: 2022-05-01 | Discharge: 2022-05-01 | Disposition: A | Discharge status: Home / Self Care | Payer: No Typology Code available for payment source | Attending: Emergency Medicine | Admitting: Emergency Medicine

## 2022-05-01 DIAGNOSIS — R000 Tachycardia, unspecified: Secondary | ICD-10-CM | POA: Diagnosis present

## 2022-05-01 DIAGNOSIS — F14129 Cocaine abuse with intoxication, unspecified: Secondary | ICD-10-CM | POA: Insufficient documentation

## 2022-05-01 DIAGNOSIS — F1990 Other psychoactive substance use, unspecified, uncomplicated: Secondary | ICD-10-CM

## 2022-05-01 LAB — CBC WITH DIFFERENTIAL/PLATELET
Abs Immature Granulocytes: 0.04 10*3/uL (ref 0.00–0.07)
Basophils Absolute: 0.1 10*3/uL (ref 0.0–0.1)
Basophils Relative: 1 %
Eosinophils Absolute: 0.3 10*3/uL (ref 0.0–0.5)
Eosinophils Relative: 2 %
HCT: 46.3 % — ABNORMAL HIGH (ref 36.0–46.0)
Hemoglobin: 15.2 g/dL — ABNORMAL HIGH (ref 12.0–15.0)
Immature Granulocytes: 0 %
Lymphocytes Relative: 29 %
Lymphs Abs: 3.9 10*3/uL (ref 0.7–4.0)
MCH: 28.5 pg (ref 26.0–34.0)
MCHC: 32.8 g/dL (ref 30.0–36.0)
MCV: 86.9 fL (ref 80.0–100.0)
Monocytes Absolute: 1.1 10*3/uL — ABNORMAL HIGH (ref 0.1–1.0)
Monocytes Relative: 8 %
Neutro Abs: 7.9 10*3/uL — ABNORMAL HIGH (ref 1.7–7.7)
Neutrophils Relative %: 60 %
Platelets: 278 10*3/uL (ref 150–400)
RBC: 5.33 MIL/uL — ABNORMAL HIGH (ref 3.87–5.11)
RDW: 12.9 % (ref 11.5–15.5)
WBC: 13.3 10*3/uL — ABNORMAL HIGH (ref 4.0–10.5)
nRBC: 0 % (ref 0.0–0.2)

## 2022-05-01 LAB — COMPREHENSIVE METABOLIC PANEL
ALT: 21 U/L (ref 0–44)
AST: 20 U/L (ref 15–41)
Albumin: 4 g/dL (ref 3.5–5.0)
Alkaline Phosphatase: 67 U/L (ref 38–126)
Anion gap: 10 (ref 5–15)
BUN: 12 mg/dL (ref 6–20)
CO2: 19 mmol/L — ABNORMAL LOW (ref 22–32)
Calcium: 8.9 mg/dL (ref 8.9–10.3)
Chloride: 104 mmol/L (ref 98–111)
Creatinine, Ser: 0.55 mg/dL (ref 0.44–1.00)
GFR, Estimated: >60 mL/min (ref >60)
Glucose, Bld: 107 mg/dL — ABNORMAL HIGH (ref 70–99)
Potassium: 3.6 mmol/L (ref 3.5–5.1)
Sodium: 133 mmol/L — ABNORMAL LOW (ref 135–145)
Total Bilirubin: 0.9 mg/dL (ref 0.3–1.2)
Total Protein: 7.7 g/dL (ref 6.5–8.1)

## 2022-05-01 LAB — URINE DRUG SCREEN, QUALITATIVE (ARMC ONLY)
Amphetamines, Ur Screen: NOT DETECTED
Barbiturates, Ur Screen: NOT DETECTED
Benzodiazepine, Ur Scrn: NOT DETECTED
Cannabinoid 50 Ng, Ur ~~LOC~~: NOT DETECTED
Cocaine Metabolite,Ur ~~LOC~~: POSITIVE — AB
MDMA (Ecstasy)Ur Screen: NOT DETECTED
Methadone Scn, Ur: NOT DETECTED
Opiate, Ur Screen: NOT DETECTED
Phencyclidine (PCP) Ur S: NOT DETECTED
Tricyclic, Ur Screen: POSITIVE — AB

## 2022-05-01 LAB — POC URINE PREG, ED: Preg Test, Ur: NEGATIVE

## 2022-05-01 MED ORDER — LORAZEPAM 2 MG/ML IJ SOLN
2.0000 mg | Freq: Once | INTRAMUSCULAR | Status: AC
  Administered 2022-05-01: 2 mg via INTRAVENOUS
  Filled 2022-05-01: qty 1

## 2022-05-01 MED ORDER — SODIUM CHLORIDE 0.9 % IV BOLUS
1000.0000 mL | Freq: Once | INTRAVENOUS | Status: AC
  Administered 2022-05-01: 1000 mL via INTRAVENOUS

## 2022-05-01 NOTE — ED Provider Notes (Signed)
Procedures     ----------------------------------------- 4:37 PM on 05/01/2022 -----------------------------------------  Patient is more calm now, tachycardia improving with IV fluids and Ativan.  She is ambulatory, clear speech, tolerating oral intake, clinically sober.  Still somewhat restless but lucid and has medical decision-making capacity.  She request discharge.  Follow-up resources provided.    Carrie Mew, MD 05/01/22 [phone]

## 2022-05-01 NOTE — ED Notes (Signed)
RN made two attempts to access IV with no success. IV teams consulted.

## 2022-05-01 NOTE — ED Provider Notes (Signed)
[facility] Provider Note    Event Date/Time   First MD Initiated Contact with Patient 05/01/22 1236     (approximate)  History   Chief Complaint: Manic Behavior (Patient presents to triage paranoid that she has accidentally overdosed on her "psych meds"; She is adamant that her medications are opiates and that the cocaine she has been using for the last 2 days is not the source of her hallucinations; Patient has also not slept in 2 days; Tachycardic in triage (HR 149), says she last used cocaine 2 hours ago)  HPI  Saphronia Ozdemir is a 37 y.o. female with a past medical history of arthritis, gastric reflux, PTSD, substance abuse, presents to the emergency department for hyperactivity and insomnia.  According to the patient she admits a relapsing cocaine 2 days ago.  States over the past 2 days she has been doing a significant amount of cocaine including today.  Patient states she has not been able to sleep over the past 2 days.  Patient states she did take her normal medications including her psychiatric medications, did not "overdose" in the medical sense but states she took her normal dose of these medications in addition to doing cocaine.  Upon arrival patient noted to be quite tachycardic at 149 bpm.  Hypertensive.  Patient is able to give accurate history and answer questions appropriately.  Denies any attempt to hurt herself or anybody else.  States she cannot sleep or calm her mind down which is why she came to the emergency department.  Physical Exam   Triage Vital Signs: ED Triage Vitals  Enc Vitals Group     BP 05/01/22 1156 (!) 177/100     Pulse Rate 05/01/22 1156 (!) 149     Resp 05/01/22 1156 (!) 22     Temp 05/01/22 1156 98.3 F (36.8 C)     Temp src --      SpO2 05/01/22 1156 95 %     Weight 05/01/22 1154 210 lb (95.3 kg)     Height 05/01/22 1154 5\' 7"  (1.702 m)     Head Circumference --      Peak Flow --      Pain Score --      Pain Loc --       Pain Edu? --      Excl. in [place]? --     Most recent vital signs: Vitals:   05/01/22 1156  BP: (!) 177/100  Pulse: (!) 149  Resp: (!) 22  Temp: 98.3 F (36.8 C)  SpO2: 95%    General: Awake, anxious appearing with rapid speech CV:  Good peripheral perfusion.  Regular rhythm rate around 130 to 140 bpm. Resp:  Normal effort.  Equal breath sounds bilaterally.  No obvious wheeze rales or rhonchi Abd:  No distention.  Soft, nontender.  No rebound or guarding.  ED Results / Procedures / Treatments   EKG  EKG viewed and interpreted by myself shows sinus tachycardia at 144 bpm with a narrow QRS, normal axis, largely normal intervals and nonspecific ST changes occasional PVC.  MEDICATIONS ORDERED IN ED: Medications  LORazepam (ATIVAN) injection 2 mg (has no administration in time range)  sodium chloride 0.9 % bolus 1,000 mL (has no administration in time range)     IMPRESSION / MDM / ASSESSMENT AND PLAN / ED COURSE  I reviewed the triage vital signs and the nursing notes.  Patient's presentation is most consistent with acute presentation with potential threat  to life or bodily function.  Patient presents to the emergency department for agitation/insomnia/anxiety with rapid heart rate rapid speech.  Patient symptoms are very consistent with stimulant overdose or cocaine overdose.  Denies chest pain.  We will check labs we will dose IV Ativan to help the patient calm down we will IV hydrate and continue to closely monitor on cardiac telemetry.  Patient's EKG shows sinus tachycardia.  Patient's lab work shows slight leukocytosis of 13,000 otherwise no significant findings.  Chemistry largely nonrevealing with normal renal function.  Difficult IV stick after multiple nursing attempts I was able to place a ultrasound-guided peripheral IV.  We will dose Ativan and IV fluids.  FINAL CLINICAL IMPRESSION(S) / ED DIAGNOSES   Tachycardia Substance use Cocaine intoxication   Note:  This  document was prepared using Dragon voice recognition software and may include unintentional dictation errors.

## 2022-05-01 NOTE — ED Triage Notes (Signed)
Patient presents to triage paranoid that she has accidentally overdosed on her "psych meds"; She is adamant that her medications are opiates and that the cocaine she has been using for the last 2 days is not the source of her hallucinations; Patient has also not slept in 2 days; Tachycardic in triage (HR 149), says she last used cocaine 2 hours ago

## 2022-05-01 NOTE — ED Notes (Signed)
Patient informed that IV team will be in to assess her for IV access. Patient verbalized understanding.

## 2022-05-01 NOTE — ED Notes (Signed)
Patient updated on plan of care. Patient given food and drink per request

## 2022-05-01 NOTE — ED Notes (Signed)
Patient reporting that she no longer wishes to be in the ER. Patient states that she is sleepy and just wants to go home and go to bed. Patient denies being SI/HI. Patient has a safe ride home present in the ER.

## 2022-05-01 NOTE — ED Notes (Signed)
Dr Kerman Passey is in with patient attempting to access in US guided IV.

## 2022-05-01 NOTE — ED Notes (Signed)
Patient stated to RN that she is only allowing one more stick for IV access.

## 2022-05-17 ENCOUNTER — Telehealth: Payer: Self-pay

## 2022-05-17 ENCOUNTER — Encounter: Payer: Medicaid Other | Admitting: Family

## 2022-05-17 NOTE — Telephone Encounter (Signed)
Pt called and left vm regarding that she had to cancel appointment today due to sinus infection symptoms, said she has bad sinus pressure/head pressure & congestion. Asked if you can send rx abx for her please advise

## 2022-05-18 MED ORDER — LEVOFLOXACIN 500 MG PO TABS: 500.0000 mg | ORAL_TABLET | Freq: Every day | ORAL | 0 refills | Status: AC

## 2022-06-17 ENCOUNTER — Encounter: Payer: Self-pay | Admitting: Family

## 2022-06-17 ENCOUNTER — Other Ambulatory Visit: Payer: Self-pay

## 2022-06-17 ENCOUNTER — Ambulatory Visit: Payer: Medicaid Other | Admitting: Family

## 2022-06-17 VITALS — BP 124/80 | HR 120 | Ht 67.0 in | Wt 227.2 lb

## 2022-06-17 DIAGNOSIS — Z0001 Encounter for general adult medical examination with abnormal findings: Secondary | ICD-10-CM | POA: Diagnosis not present

## 2022-06-17 DIAGNOSIS — Z13228 Encounter for screening for other metabolic disorders: Secondary | ICD-10-CM

## 2022-06-17 DIAGNOSIS — Z202 Contact with and (suspected) exposure to infections with a predominantly sexual mode of transmission: Secondary | ICD-10-CM

## 2022-06-17 DIAGNOSIS — M545 Low back pain, unspecified: Secondary | ICD-10-CM | POA: Diagnosis not present

## 2022-06-17 DIAGNOSIS — E559 Vitamin D deficiency, unspecified: Secondary | ICD-10-CM

## 2022-06-17 DIAGNOSIS — J324 Chronic pansinusitis: Secondary | ICD-10-CM

## 2022-06-17 DIAGNOSIS — G8929 Other chronic pain: Secondary | ICD-10-CM

## 2022-06-17 DIAGNOSIS — Z01419 Encounter for gynecological examination (general) (routine) without abnormal findings: Secondary | ICD-10-CM | POA: Diagnosis not present

## 2022-06-17 DIAGNOSIS — E538 Deficiency of other specified B group vitamins: Secondary | ICD-10-CM

## 2022-06-17 DIAGNOSIS — R7303 Prediabetes: Secondary | ICD-10-CM

## 2022-06-17 DIAGNOSIS — Z13220 Encounter for screening for lipoid disorders: Secondary | ICD-10-CM

## 2022-06-17 DIAGNOSIS — R300 Dysuria: Secondary | ICD-10-CM

## 2022-06-17 DIAGNOSIS — E782 Mixed hyperlipidemia: Secondary | ICD-10-CM

## 2022-06-17 DIAGNOSIS — R5383 Other fatigue: Secondary | ICD-10-CM

## 2022-06-17 DIAGNOSIS — Z130 Encounter for screening for diseases of the blood and blood-forming organs and certain disorders involving the immune mechanism: Secondary | ICD-10-CM

## 2022-06-17 DIAGNOSIS — Z0000 Encounter for general adult medical examination without abnormal findings: Secondary | ICD-10-CM

## 2022-06-17 DIAGNOSIS — Z1329 Encounter for screening for other suspected endocrine disorder: Secondary | ICD-10-CM

## 2022-06-17 LAB — POCT URINALYSIS DIPSTICK
Bilirubin, UA: NEGATIVE
Glucose, UA: NEGATIVE
Leukocytes, UA: NEGATIVE
Nitrite, UA: NEGATIVE
Protein, UA: NEGATIVE
Spec Grav, UA: 1.025 (ref 1.010–1.025)
Urobilinogen, UA: 0.2 E.U./dL
pH, UA: 5.5 (ref 5.0–8.0)

## 2022-06-17 MED ORDER — ALBUTEROL SULFATE HFA 108 (90 BASE) MCG/ACT IN AERS: 2.0000 | INHALATION_SPRAY | Freq: Four times a day (QID) | RESPIRATORY_TRACT | 5 refills | Status: DC | PRN

## 2022-06-17 MED ORDER — OMEPRAZOLE 20 MG PO CPDR: 20.0000 mg | DELAYED_RELEASE_CAPSULE | Freq: Two times a day (BID) | ORAL | 1 refills | Status: DC

## 2022-06-17 MED ORDER — FLUTICASONE PROPIONATE 50 MCG/ACT NA SUSP: NASAL | 6 refills | Status: DC

## 2022-06-17 MED ORDER — PHENTERMINE HCL 37.5 MG PO TABS: 37.5000 mg | ORAL_TABLET | Freq: Every day | ORAL | 0 refills | Status: DC

## 2022-06-17 MED ORDER — EPIPEN 2-PAK 0.3 MG/0.3ML IJ SOAJ: INTRAMUSCULAR | 1 refills | Status: DC

## 2022-06-17 MED ORDER — CETIRIZINE HCL 10 MG PO TABS: 10.0000 mg | ORAL_TABLET | Freq: Every day | ORAL | 1 refills | Status: DC

## 2022-06-17 MED ORDER — MONTELUKAST SODIUM 10 MG PO TABS: 10.0000 mg | ORAL_TABLET | Freq: Every day | ORAL | 1 refills | Status: DC
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
## 2022-06-17 NOTE — Patient Instructions (Signed)
Health Maintenance, Female Adopting a healthy lifestyle and getting preventive care are important in promoting health and wellness. Ask your health care provider about: The right schedule for you to have regular tests and exams. Things you can do on your own to prevent diseases and keep yourself healthy. What should I know about diet, weight, and exercise? Eat a healthy diet  Eat a diet that includes plenty of vegetables, fruits, low-fat dairy products, and lean protein. Do not eat a lot of foods that are high in solid fats, added sugars, or sodium. Maintain a healthy weight Body mass index (BMI) is used to identify weight problems. It estimates body fat based on height and weight. Your health care provider can help determine your BMI and help you achieve or maintain a healthy weight. Get regular exercise Get regular exercise. This is one of the most important things you can do for your health. Most adults should: Exercise for at least 150 minutes each week. The exercise should increase your heart rate and make you sweat (moderate-intensity exercise). Do strengthening exercises at least twice a week. This is in addition to the moderate-intensity exercise. Spend less time sitting. Even light physical activity can be beneficial. Watch cholesterol and blood lipids Have your blood tested for lipids and cholesterol at 39 years of age, then have this test every 5 years. Have your cholesterol levels checked more often if: Your lipid or cholesterol levels are high. You are older than 40 years of age. You are at high risk for heart disease. What should I know about cancer screening? Depending on your health history and family history, you may need to have cancer screening at various ages. This may include screening for: Breast cancer. Cervical cancer. Colorectal cancer. Skin cancer. Lung cancer. What should I know about heart disease, diabetes, and high blood pressure? Blood pressure and heart  disease High blood pressure causes heart disease and increases the risk of stroke. This is more likely to develop in people who have high blood pressure readings or are overweight. Have your blood pressure checked: Every 3-5 years if you are 18-39 years of age. Every year if you are 40 years old or older. Diabetes Have regular diabetes screenings. This checks your fasting blood sugar level. Have the screening done: Once every three years after age 40 if you are at a normal weight and have a low risk for diabetes. More often and at a younger age if you are overweight or have a high risk for diabetes. What should I know about preventing infection? Hepatitis B If you have a higher risk for hepatitis B, you should be screened for this virus. Talk with your health care provider to find out if you are at risk for hepatitis B infection. Hepatitis C Testing is recommended for: Everyone born from 1945 through 1965. Anyone with known risk factors for hepatitis C. Sexually transmitted infections (STIs) Get screened for STIs, including gonorrhea and chlamydia, if: You are sexually active and are younger than 39 years of age. You are older than 39 years of age and your health care provider tells you that you are at risk for this type of infection. Your sexual activity has changed since you were last screened, and you are at increased risk for chlamydia or gonorrhea. Ask your health care provider if you are at risk. Ask your health care provider about whether you are at high risk for HIV. Your health care provider may recommend a prescription medicine to help prevent HIV   infection. If you choose to take medicine to prevent HIV, you should first get tested for HIV. You should then be tested every 3 months for as long as you are taking the medicine. Pregnancy If you are about to stop having your period (premenopausal) and you may become pregnant, seek counseling before you get pregnant. Take 400 to 800  micrograms (mcg) of folic acid every day if you become pregnant. Ask for birth control (contraception) if you want to prevent pregnancy. Osteoporosis and menopause Osteoporosis is a disease in which the bones lose minerals and strength with aging. This can result in bone fractures. If you are 65 years old or older, or if you are at risk for osteoporosis and fractures, ask your health care provider if you should: Be screened for bone loss. Take a calcium or vitamin D supplement to lower your risk of fractures. Be given hormone replacement therapy (HRT) to treat symptoms of menopause. Follow these instructions at home: Alcohol use Do not drink alcohol if: Your health care provider tells you not to drink. You are pregnant, may be pregnant, or are planning to become pregnant. If you drink alcohol: Limit how much you have to: 0-1 drink a day. Know how much alcohol is in your drink. In the [place] one drink equals one 12 oz bottle of beer (355 mL), one 5 oz glass of wine (148 mL), or one 1 oz glass of hard liquor (44 mL). Lifestyle Do not use any products that contain nicotine or tobacco. These products include cigarettes, chewing tobacco, and vaping devices, such as e-cigarettes. If you need help quitting, ask your health care provider. Do not use street drugs. Do not share needles. Ask your health care provider for help if you need support or information about quitting drugs. General instructions Schedule regular health, dental, and eye exams. Stay current with your vaccines. Tell your health care provider if: You often feel depressed. You have ever been abused or do not feel safe at home. Summary Adopting a healthy lifestyle and getting preventive care are important in promoting health and wellness. Follow your health care provider's instructions about healthy diet, exercising, and getting tested or screened for diseases. Follow your health care provider's instructions on monitoring your  cholesterol and blood pressure. This information is not intended to replace advice given to you by your health care provider. Make sure you discuss any questions you have with your health care provider. Document Revised: 08/11/2020 Document Reviewed: 08/11/2020 Elsevier Patient Education  2023 Elsevier Inc.  Fat and Cholesterol Restricted Eating Plan Eating a diet that limits fat and cholesterol may help lower your risk for heart disease and other conditions. Your body needs fat and cholesterol for basic functions, but eating too much of these things can be harmful to your health. Your health care provider may order lab tests to check your blood fat (lipid) and cholesterol levels. This helps your health care provider understand your risk for certain conditions and whether you need to make diet changes. Work with your health care provider or dietitian to make an eating plan that is right for you. Your plan includes: Limit your fat intake to ______% or less of your total calories a day. This is ______g of fat per day. Limit your saturated fat intake to ______% or less of your total calories a day. This is ______g of saturated fat per day. Limit the amount of cholesterol in your diet to less than _________mg a day. Eat ___________ g of fiber   a day. What are tips for following this plan? General guidelines If you are overweight, work with your health care provider to lose weight safely. Losing just 5-10% of your body weight can improve your overall health and help prevent diseases such as diabetes and heart disease. Avoid: Foods with added sugar. Fried foods. Foods that contain partially hydrogenated oils, including stick margarine, some tub margarines, cookies, crackers, and other baked goods. If you drink alcohol: Limit how much you have to: 0-1 drink a day for women who are not pregnant. 0-2 drinks a day for men. Know how much alcohol is in a drink. In the [place] one drink equals one 12 oz  bottle of beer (355 mL), one 5 oz glass of wine (148 mL), or one 1 oz glass of hard liquor (44 mL). Reading food labels Check food labels for: Trans fats or partially hydrogenated oils. Avoid foods that contain these. High amounts of saturated fat. Choose foods that are low in saturated fat (less than 2 g). The amount of cholesterol in each serving. The amount of fiber in each serving. Choose foods with healthy fats, such as: Monounsaturated and polyunsaturated fats. These include olive and canola oil, flaxseeds, walnuts, almonds, and seeds. Omega-3 fats. These are found in foods such as salmon, mackerel, sardines, tuna, flaxseed oil, and ground flaxseeds. Choose grain products that have whole grains. Look for the word "whole" as the first word in the ingredient list. Cooking Cook foods using methods other than frying. Baking, boiling, grilling, and broiling are some healthy options. Eat more home-cooked food and less restaurant, buffet, and fast food. Avoid cooking using saturated fats. Animal sources of saturated fats include meats, butter, and cream. Plant sources of saturated fats include palm oil, palm kernel oil, and coconut oil. Meal planning  At meals, imagine dividing your plate into fourths: Fill one-half of your plate with vegetables, green salads, and fruit. Fill one-fourth of your plate with whole grains. Fill one-fourth of your plate with lean protein foods. Eat fish that is high in omega-3 fats at least two times a week. Eat more foods that contain fiber, such as whole grains, beans, apples, pears, berries, broccoli, carrots, peas, and barley. These foods help promote healthy cholesterol levels in the blood. What foods should I eat? Fruits All fresh, canned (in natural juice), or frozen fruits. Vegetables Fresh or frozen vegetables (raw, steamed, roasted, or grilled). Green salads. Grains Whole grains, such as whole wheat or whole grain breads, crackers, cereals, and  pasta. Unsweetened oatmeal, bulgur, barley, quinoa, or brown rice. Corn or whole wheat flour tortillas. Meats and other proteins Ground beef (85% or leaner), grass-fed beef, or beef trimmed of fat. Skinless chicken or turkey. Ground chicken or turkey. Pork trimmed of fat. All fish and seafood. Egg whites. Dried beans, peas, or lentils. Unsalted nuts or seeds. Unsalted canned beans. Natural nut butters without added sugar and oil. Dairy Low-fat or nonfat dairy products, such as skim or 1% milk, 2% or reduced-fat cheeses, low-fat and fat-free ricotta or cottage cheese, or plain low-fat and nonfat yogurt. Fats and oils Tub margarine without trans fats. Light or reduced-fat mayonnaise and salad dressings. Avocado. Olive, canola, sesame, or safflower oils. The items listed above may not be a complete list of foods and beverages you can eat. Contact a dietitian for more information. What foods should I avoid? Fruits Canned fruit in heavy syrup. Fruit in cream or butter sauce. Fried fruit. Vegetables Vegetables cooked in cheese, cream, or butter sauce.   Fried vegetables. Grains White bread. White pasta. White rice. Cornbread. Bagels, pastries, and croissants. Crackers and snack foods that contain trans fat and hydrogenated oils. Meats and other proteins Fatty cuts of meat. Ribs, chicken wings, bacon, sausage, bologna, salami, chitterlings, fatback, hot dogs, bratwurst, and packaged lunch meats. Liver and organ meats. Whole eggs and egg yolks. Chicken and turkey with skin. Fried meat. Dairy Whole or 2% milk, cream, half-and-half, and cream cheese. Whole milk cheeses. Whole-fat or sweetened yogurt. Full-fat cheeses. Nondairy creamers and whipped toppings. Processed cheese, cheese spreads, and cheese curds. Fats and oils Butter, stick margarine, lard, shortening, ghee, or bacon fat. Coconut, palm kernel, and palm oils. Beverages Alcohol. Sugar-sweetened drinks such as sodas, lemonade, and fruit  drinks. Sweets and desserts Corn syrup, sugars, honey, and molasses. Candy. Jam and jelly. Syrup. Sweetened cereals. Cookies, pies, cakes, donuts, muffins, and ice cream. The items listed above may not be a complete list of foods and beverages you should avoid. Contact a dietitian for more information. Summary Your body needs fat and cholesterol for basic functions. However, eating too much of these things can be harmful to your health. Work with your health care provider and dietitian to follow a diet that limits fat and cholesterol. Doing this may help lower your risk for heart disease and other conditions. Choose healthy fats, such as monounsaturated and polyunsaturated fats, and foods high in omega-3 fatty acids. Eat fiber-rich foods, such as whole grains, beans, peas, fruits, and vegetables. Limit or avoid alcohol, fried foods, and foods high in saturated fats, partially hydrogenated oils, and sugar. This information is not intended to replace advice given to you by your health care provider. Make sure you discuss any questions you have with your health care provider. Document Revised: 08/01/2020 Document Reviewed: 08/01/2020 Elsevier Patient Education  2023 Elsevier Inc.  

## 2022-06-18 LAB — LIPID PANEL
Chol/HDL Ratio: 4.2 ratio (ref 0.0–4.4)
Cholesterol, Total: 191 mg/dL (ref 100–199)
HDL: 45 mg/dL (ref >39)
LDL Chol Calc (NIH): 108 mg/dL — ABNORMAL HIGH (ref 0–99)
Triglycerides: 220 mg/dL — ABNORMAL HIGH (ref 0–149)
VLDL Cholesterol Cal: 38 mg/dL (ref 5–40)

## 2022-06-18 LAB — HEMOGLOBIN A1C
Est. average glucose Bld gHb Est-mCnc: 105 mg/dL
Hgb A1c MFr Bld: 5.3 % (ref 4.8–5.6)

## 2022-06-18 LAB — CMP14+EGFR
ALT: 19 IU/L (ref 0–32)
AST: 17 IU/L (ref 0–40)
Albumin/Globulin Ratio: 1.6 (ref 1.2–2.2)
Albumin: 4.1 g/dL (ref 3.9–4.9)
Alkaline Phosphatase: 81 IU/L (ref 44–121)
BUN/Creatinine Ratio: 24 — ABNORMAL HIGH (ref 9–23)
BUN: 12 mg/dL (ref 6–20)
Bilirubin Total: <0.2 mg/dL (ref 0.0–1.2)
CO2: 18 mmol/L — ABNORMAL LOW (ref 20–29)
Calcium: 9.8 mg/dL (ref 8.7–10.2)
Chloride: 107 mmol/L — ABNORMAL HIGH (ref 96–106)
Creatinine, Ser: 0.5 mg/dL — ABNORMAL LOW (ref 0.57–1.00)
Globulin, Total: 2.6 g/dL (ref 1.5–4.5)
Glucose: 90 mg/dL (ref 70–99)
Potassium: 4.7 mmol/L (ref 3.5–5.2)
Sodium: 143 mmol/L (ref 134–144)
Total Protein: 6.7 g/dL (ref 6.0–8.5)
eGFR: 122 mL/min/{1.73_m2} (ref >59)

## 2022-06-18 LAB — CBC WITH DIFFERENTIAL
Basophils Absolute: 0.1 10*3/uL (ref 0.0–0.2)
Basos: 1 %
EOS (ABSOLUTE): 0.3 10*3/uL (ref 0.0–0.4)
Eos: 3 %
Hematocrit: 46.5 % (ref 34.0–46.6)
Hemoglobin: 15.6 g/dL (ref 11.1–15.9)
Immature Grans (Abs): 0 10*3/uL (ref 0.0–0.1)
Immature Granulocytes: 0 %
Lymphocytes Absolute: 3.4 10*3/uL — ABNORMAL HIGH (ref 0.7–3.1)
Lymphs: 38 %
MCH: 29.3 pg (ref 26.6–33.0)
MCHC: 33.5 g/dL (ref 31.5–35.7)
MCV: 87 fL (ref 79–97)
Monocytes Absolute: 0.8 10*3/uL (ref 0.1–0.9)
Monocytes: 9 %
Neutrophils Absolute: 4.5 10*3/uL (ref 1.4–7.0)
Neutrophils: 49 %
RBC: 5.33 x10E6/uL — ABNORMAL HIGH (ref 3.77–5.28)
RDW: 12.5 % (ref 11.7–15.4)
WBC: 9.1 10*3/uL (ref 3.4–10.8)

## 2022-06-18 LAB — TSH: TSH: <0.005 u[IU]/mL — ABNORMAL LOW (ref 0.450–4.500)

## 2022-06-18 LAB — SPECIMEN STATUS REPORT

## 2022-06-18 LAB — VITAMIN D 25 HYDROXY (VIT D DEFICIENCY, FRACTURES): Vit D, 25-Hydroxy: 20.2 ng/mL — ABNORMAL LOW (ref 30.0–100.0)

## 2022-06-18 LAB — VITAMIN B12: Vitamin B-12: 442 pg/mL (ref 232–1245)

## 2022-06-19 LAB — URINE CULTURE

## 2022-06-21 ENCOUNTER — Encounter: Payer: Self-pay | Admitting: Family

## 2022-06-21 LAB — CHLAMYDIA/GONOCOCCUS/TRICHOMONAS, NAA
Chlamydia by NAA: NEGATIVE
Gonococcus by NAA: NEGATIVE
Trich vag by NAA: NEGATIVE

## 2022-06-23 ENCOUNTER — Telehealth: Payer: Self-pay | Admitting: Family

## 2022-06-23 NOTE — Telephone Encounter (Signed)
Spoke with Estill Bamberg at [facility] who was requesting if it is okay to fill the patients Epipen for generic since the brand is unavailable even though it was written with a DAW1. Estill Bamberg gave me a verbal that this was okay and I relayed this to [facility].

## 2022-07-01 ENCOUNTER — Ambulatory Visit: Payer: Medicaid Other | Admitting: Family

## 2022-07-15 ENCOUNTER — Ambulatory Visit: Payer: Medicaid Other | Admitting: Family

## 2022-07-15 ENCOUNTER — Encounter: Payer: Self-pay | Admitting: Family

## 2022-07-15 VITALS — BP 128/76 | HR 112 | Ht 67.0 in | Wt 230.4 lb

## 2022-07-15 DIAGNOSIS — R233 Spontaneous ecchymoses: Secondary | ICD-10-CM | POA: Diagnosis not present

## 2022-07-15 DIAGNOSIS — K219 Gastro-esophageal reflux disease without esophagitis: Secondary | ICD-10-CM

## 2022-07-15 DIAGNOSIS — L509 Urticaria, unspecified: Secondary | ICD-10-CM

## 2022-07-15 DIAGNOSIS — F3160 Bipolar disorder, current episode mixed, unspecified: Secondary | ICD-10-CM | POA: Diagnosis not present

## 2022-07-15 DIAGNOSIS — G43009 Migraine without aura, not intractable, without status migrainosus: Secondary | ICD-10-CM

## 2022-07-16 LAB — RPR W/REFLEX TO TREPSURE: RPR: NONREACTIVE

## 2022-07-16 LAB — HIV ANTIBODY (ROUTINE TESTING W REFLEX): HIV Screen 4th Generation wRfx: NONREACTIVE

## 2022-07-16 LAB — HCV AB W REFLEX TO QUANT PCR: HCV Ab: NONREACTIVE

## 2022-07-16 LAB — T PALLIDUM ANTIBODY, EIA: T pallidum Antibody, EIA: NEGATIVE

## 2022-07-16 LAB — TSH+T4F+T3FREE
Free T4: 1.93 ng/dL — ABNORMAL HIGH (ref 0.82–1.77)
T3, Free: 7.4 pg/mL — ABNORMAL HIGH (ref 2.0–4.4)
TSH: <0.005 u[IU]/mL — ABNORMAL LOW (ref 0.450–4.500)

## 2022-07-16 LAB — HCV INTERPRETATION

## 2022-07-17 ENCOUNTER — Encounter: Payer: Self-pay | Admitting: Family

## 2022-07-17 LAB — TRYPTASE: Tryptase: 6.4 ug/L (ref 2.2–13.2)

## 2022-07-17 NOTE — Progress Notes (Signed)
Established Patient Office Visit  Subjective:  Patient ID: Elizabeth Spencer, female    DOB: [birth year]  Age: 37 y.o. MRN: [id number]  Chief Complaint  Patient presents with   Follow-up    2 week follow up    Patient here for her 2 week follow up.  She did not have all of her labs drawn at her last time, as we were unable to get enough tubes.  Will discuss lab results we were able to get from last time.   No other concerns at this time.   Past Medical History:  Diagnosis Date   Aneurysm 01/04/2014   Arthritis    Asthma    WELL CONTROLLED   Brain aneurysm 2007   NEUROLOGY NOTE DOES NOT MENTION ANEURYSM BUT PT STATES SHE DID NOT HAVE TO HAVE SURGERY   Complication of anesthesia    FOR 1 C-SECTION PT WAS ITCHING AND VERY RED ON HER FACE   Cough, persistent 01/27/2016   Dermatitis due to sunburn 11/08/2013   Family history of adverse reaction to anesthesia    brother, neice and nephew got red in face with hives   Gallstones    GERD (gastroesophageal reflux disease)    Gonorrhea 06/20/2020   Headache    CHRONIC HEADACHES   History of chronic cough    DRY   Loss of memory 05/30/2017   Perforation of left tympanic membrane 05/17/2013   Pneumonia    PTSD (post-traumatic stress disorder)    PTSD (post-traumatic stress disorder)    Thyroid condition    PT WAS JUST TOLD ON 08-25-17 THAT SHE HAS A THYROID PROBLEM AND IS GOING TO F/U WITH ENDOCRINOLOGIST IN 2 WEEKS   Trichomonas vaginalis (TV) infection 06/20/2020    Past Surgical History:  Procedure Laterality Date   ABDOMINAL HYSTERECTOMY     CESAREAN SECTION     x3   CYSTOSCOPY N/A 09/29/2017   Procedure: CYSTOSCOPY;  Surgeon: Vena Austria, MD;  Location: [facility];  Service: Gynecology;  Laterality: N/A;   CYSTOSCOPY N/A 11/05/2017   Procedure: CYSTOSCOPY;  Surgeon: Vena Austria, MD;  Location: [facility];  Service: Gynecology;  Laterality: N/A;   HYSTEROSCOPY WITH D & C N/A 09/01/2017   Procedure: DILATATION AND  CURETTAGE /HYSTEROSCOPY;  Surgeon: Vena Austria, MD;  Location: [facility];  Service: Gynecology;  Laterality: N/A;   LAPAROSCOPY N/A 09/01/2017   Procedure: LAPAROSCOPY OPERATIVE with biopsy;  Surgeon: Vena Austria, MD;  Location: [facility];  Service: Gynecology;  Laterality: N/A;   LAPAROSCOPY N/A 11/05/2017   Procedure: LAPAROSCOPY DIAGNOSTIC;  Surgeon: Vena Austria, MD;  Location: [facility];  Service: Gynecology;  Laterality: N/A;   REPAIR VAGINAL CUFF N/A 11/05/2017   Procedure: REPAIR VAGINAL CUFF;  Surgeon: Vena Austria, MD;  Location: [facility];  Service: Gynecology;  Laterality: N/A;   TOTAL LAPAROSCOPIC HYSTERECTOMY WITH SALPINGECTOMY Bilateral 09/29/2017   Procedure: TOTAL LAPAROSCOPIC HYSTERECTOMY WITH SALPINGECTOMY;  Surgeon: Vena Austria, MD;  Location: [facility];  Service: Gynecology;  Laterality: Bilateral;    Social History   Socioeconomic History   Marital status: Single    Spouse name: Not on file   Number of children: Not on file   Years of education: Not on file   Highest education level: Not on file  Occupational History   Not on file  Tobacco Use   Smoking status: Every Day    Packs/day: 0.50    Years: 18.00    Additional pack years: 0.00    Total pack years:  9.00    Types: Cigarettes   Smokeless tobacco: Never  Vaping Use   Vaping Use: Never used  Substance and Sexual Activity   Alcohol use: Yes    Alcohol/week: 0.0 standard drinks of alcohol    Comment: occasionally   Drug use: Not Currently    Types: Marijuana   Sexual activity: Yes    Partners: Male    Birth control/protection: Surgical    Comment: Hysterectomy  Other Topics Concern   Not on file  Social History Narrative   Not on file   Social Determinants of Health   Financial Resource Strain: Not on file  Food Insecurity: Not on file  Transportation Needs: Not on file  Physical Activity: Not on file  Stress: Not on file  Social Connections: Not on file  Intimate Partner  Violence: Not on file    Family History  Problem Relation Age of Onset   Hypertension Mother    Alcohol abuse Mother    Deep vein thrombosis Mother    Alcohol abuse Father    COPD Father    Emphysema Father    Heart disease Father     Allergies  Allergen Reactions   Azithromycin Swelling and Other (See Comments)   Clindamycin Swelling   Codeine Hives and Other (See Comments)    Nausea/dizzy Nausea/dizzy   Meloxicam Other (See Comments)    Burns up from the inside    Neurontin [Gabapentin] Other (See Comments)    Makes her pass out and not remember what happened prior to taking med.   Penicillins Anaphylaxis    Patient allergic to all "cillins" Has patient had a PCN reaction causing immediate rash, facial/tongue/throat swelling, SOB or lightheadedness with hypotension: Yes Has patient had a PCN reaction causing severe rash involving mucus membranes or skin necrosis: Yes Has patient had a PCN reaction that required hospitalization: No Has patient had a PCN reaction occurring within the last 10 years: Yes If all of the above answers are "NO", then may proceed with Cephalosporin use.    Clindamycin/Lincomycin Swelling   Other Hives and Other (See Comments)    Berry flavored food and drinks   Diphenhydramine Hcl Rash and Other (See Comments)   Doxycycline Rash    Review of Systems  All other systems reviewed and are negative.      Objective:   BP 128/76   Pulse (!) 112   Ht 5\' 7"  (1.702 m)   Wt 230 lb 6.4 oz (104.5 kg)   LMP 08/22/2017 Comment: says she just spots.  SpO2 99%   BMI 36.09 kg/m   Vitals:   07/15/22 1341  BP: 128/76  Pulse: (!) 112  Height: 5\' 7"  (1.702 m)  Weight: 230 lb 6.4 oz (104.5 kg)  SpO2: 99%  BMI (Calculated): 36.08    Physical Exam Vitals and nursing note reviewed.  Constitutional:      Appearance: Normal appearance. She is normal weight.  HENT:     Head: Normocephalic.  Eyes:     Pupils: Pupils are equal, round, and  reactive to light.  Cardiovascular:     Rate and Rhythm: Normal rate.  Pulmonary:     Effort: Pulmonary effort is normal.  Neurological:     Mental Status: She is alert.  Psychiatric:        Mood and Affect: Mood is anxious.        Judgment: Judgment is impulsive.      Results for orders placed or performed in visit on  07/15/22  Tryptase  Result Value Ref Range   Tryptase 6.4 2.2 - 13.2 ug/L      Assessment & Plan:   Problem List Items Addressed This Visit     Bipolar affective disorder   Gastroesophageal reflux disease   Migraine without aura and without status migrainosus, not intractable   Other Visit Diagnoses     Hives    -  Primary   Relevant Orders   Tryptase (Completed)   Bruises easily       Relevant Orders   Factor 5 Mutation Leiden       Return in about 1 month (around 08/14/2022) for F/U.   Total time spent: 30 minutes  Miki Kins, FNP  07/15/2022

## 2022-07-27 ENCOUNTER — Encounter: Payer: Medicaid Other | Admitting: Licensed Practical Nurse

## 2022-07-29 LAB — FACTOR 5 LEIDEN

## 2022-08-02 ENCOUNTER — Encounter: Payer: Self-pay | Admitting: Licensed Practical Nurse

## 2022-08-19 ENCOUNTER — Telehealth: Payer: Self-pay | Admitting: Family

## 2022-08-19 NOTE — Telephone Encounter (Signed)
Zella Ball from [facility] called and needs referral to oncology for patient. She spoke to someone last week who was supposed to send the referral but has not received it. Can you please place referral?

## 2022-08-31 ENCOUNTER — Emergency Department: Payer: Medicaid Other

## 2022-08-31 ENCOUNTER — Inpatient Hospital Stay
Admission: EM | Admit: 2022-08-31 | Discharge: 2022-09-01 | DRG: 281 | Disposition: A | Discharge status: Other Acute Inpatient Hospital | Payer: Medicaid Other | Attending: Student | Admitting: Student

## 2022-08-31 ENCOUNTER — Other Ambulatory Visit: Payer: Self-pay

## 2022-08-31 DIAGNOSIS — Z811 Family history of alcohol abuse and dependence: Secondary | ICD-10-CM

## 2022-08-31 DIAGNOSIS — E876 Hypokalemia: Secondary | ICD-10-CM | POA: Diagnosis present

## 2022-08-31 DIAGNOSIS — D6851 Activated protein C resistance: Secondary | ICD-10-CM | POA: Diagnosis present

## 2022-08-31 DIAGNOSIS — K219 Gastro-esophageal reflux disease without esophagitis: Secondary | ICD-10-CM | POA: Diagnosis present

## 2022-08-31 DIAGNOSIS — E039 Hypothyroidism, unspecified: Secondary | ICD-10-CM | POA: Diagnosis present

## 2022-08-31 DIAGNOSIS — K760 Fatty (change of) liver, not elsewhere classified: Secondary | ICD-10-CM | POA: Diagnosis present

## 2022-08-31 DIAGNOSIS — Z886 Allergy status to analgesic agent status: Secondary | ICD-10-CM

## 2022-08-31 DIAGNOSIS — Z90710 Acquired absence of both cervix and uterus: Secondary | ICD-10-CM

## 2022-08-31 DIAGNOSIS — F1410 Cocaine abuse, uncomplicated: Secondary | ICD-10-CM | POA: Diagnosis present

## 2022-08-31 DIAGNOSIS — I214 Non-ST elevation (NSTEMI) myocardial infarction: Principal | ICD-10-CM

## 2022-08-31 DIAGNOSIS — Z8249 Family history of ischemic heart disease and other diseases of the circulatory system: Secondary | ICD-10-CM

## 2022-08-31 DIAGNOSIS — E0520 Thyrotoxicosis with toxic multinodular goiter without thyrotoxic crisis or storm: Secondary | ICD-10-CM | POA: Diagnosis present

## 2022-08-31 DIAGNOSIS — E042 Nontoxic multinodular goiter: Secondary | ICD-10-CM | POA: Diagnosis present

## 2022-08-31 DIAGNOSIS — Z888 Allergy status to other drugs, medicaments and biological substances status: Secondary | ICD-10-CM

## 2022-08-31 DIAGNOSIS — Z825 Family history of asthma and other chronic lower respiratory diseases: Secondary | ICD-10-CM

## 2022-08-31 DIAGNOSIS — Z881 Allergy status to other antibiotic agents status: Secondary | ICD-10-CM

## 2022-08-31 DIAGNOSIS — E782 Mixed hyperlipidemia: Secondary | ICD-10-CM | POA: Diagnosis present

## 2022-08-31 DIAGNOSIS — Z6834 Body mass index (BMI) 34.0-34.9, adult: Secondary | ICD-10-CM

## 2022-08-31 DIAGNOSIS — Z885 Allergy status to narcotic agent status: Secondary | ICD-10-CM

## 2022-08-31 DIAGNOSIS — R079 Chest pain, unspecified: Secondary | ICD-10-CM | POA: Diagnosis present

## 2022-08-31 DIAGNOSIS — F17210 Nicotine dependence, cigarettes, uncomplicated: Secondary | ICD-10-CM | POA: Diagnosis present

## 2022-08-31 DIAGNOSIS — F4310 Post-traumatic stress disorder, unspecified: Secondary | ICD-10-CM | POA: Diagnosis present

## 2022-08-31 DIAGNOSIS — E559 Vitamin D deficiency, unspecified: Secondary | ICD-10-CM | POA: Diagnosis present

## 2022-08-31 DIAGNOSIS — I2510 Atherosclerotic heart disease of native coronary artery without angina pectoris: Secondary | ICD-10-CM | POA: Diagnosis present

## 2022-08-31 DIAGNOSIS — R7881 Bacteremia: Secondary | ICD-10-CM | POA: Diagnosis present

## 2022-08-31 DIAGNOSIS — F419 Anxiety disorder, unspecified: Secondary | ICD-10-CM | POA: Diagnosis present

## 2022-08-31 DIAGNOSIS — F319 Bipolar disorder, unspecified: Secondary | ICD-10-CM | POA: Diagnosis present

## 2022-08-31 DIAGNOSIS — E669 Obesity, unspecified: Secondary | ICD-10-CM | POA: Diagnosis present

## 2022-08-31 DIAGNOSIS — Z79899 Other long term (current) drug therapy: Secondary | ICD-10-CM

## 2022-08-31 DIAGNOSIS — Z880 Allergy status to penicillin: Secondary | ICD-10-CM

## 2022-08-31 LAB — LIPID PANEL
Cholesterol: 135 mg/dL (ref 0–200)
HDL: 46 mg/dL (ref >40)
LDL Cholesterol: 67 mg/dL (ref 0–99)
Total CHOL/HDL Ratio: 2.9 RATIO
Triglycerides: 110 mg/dL (ref <150)
VLDL: 22 mg/dL (ref 0–40)

## 2022-08-31 LAB — CBC
HCT: 43.3 % (ref 36.0–46.0)
Hemoglobin: 13.7 g/dL (ref 12.0–15.0)
MCH: 28.4 pg (ref 26.0–34.0)
MCHC: 31.6 g/dL (ref 30.0–36.0)
MCV: 89.6 fL (ref 80.0–100.0)
Platelets: 287 10*3/uL (ref 150–400)
RBC: 4.83 MIL/uL (ref 3.87–5.11)
RDW: 12.8 % (ref 11.5–15.5)
WBC: 8.8 10*3/uL (ref 4.0–10.5)
nRBC: 0 % (ref 0.0–0.2)

## 2022-08-31 LAB — GAMMA GT: GGT: 24 U/L (ref 7–50)

## 2022-08-31 LAB — URINE DRUG SCREEN, QUALITATIVE (ARMC ONLY)
Amphetamines, Ur Screen: NOT DETECTED
Barbiturates, Ur Screen: NOT DETECTED
Benzodiazepine, Ur Scrn: NOT DETECTED
Cannabinoid 50 Ng, Ur ~~LOC~~: NOT DETECTED
Cocaine Metabolite,Ur ~~LOC~~: POSITIVE — AB
MDMA (Ecstasy)Ur Screen: NOT DETECTED
Methadone Scn, Ur: NOT DETECTED
Opiate, Ur Screen: NOT DETECTED
Phencyclidine (PCP) Ur S: NOT DETECTED
Tricyclic, Ur Screen: NOT DETECTED

## 2022-08-31 LAB — BASIC METABOLIC PANEL
Anion gap: 7 (ref 5–15)
BUN: 7 mg/dL (ref 6–20)
CO2: 24 mmol/L (ref 22–32)
Calcium: 8.9 mg/dL (ref 8.9–10.3)
Chloride: 108 mmol/L (ref 98–111)
Creatinine, Ser: 0.62 mg/dL (ref 0.44–1.00)
GFR, Estimated: >60 mL/min (ref >60)
Glucose, Bld: 109 mg/dL — ABNORMAL HIGH (ref 70–99)
Potassium: 3.5 mmol/L (ref 3.5–5.1)
Sodium: 139 mmol/L (ref 135–145)

## 2022-08-31 LAB — TSH: TSH: <0.01 u[IU]/mL — ABNORMAL LOW (ref 0.350–4.500)

## 2022-08-31 LAB — HEPATIC FUNCTION PANEL
ALT: 36 U/L (ref 0–44)
AST: 38 U/L (ref 15–41)
Albumin: 3.5 g/dL (ref 3.5–5.0)
Alkaline Phosphatase: 71 U/L (ref 38–126)
Bilirubin, Direct: <0.1 mg/dL (ref 0.0–0.2)
Total Bilirubin: 0.4 mg/dL (ref 0.3–1.2)
Total Protein: 6.5 g/dL (ref 6.5–8.1)

## 2022-08-31 LAB — LIPASE, BLOOD: Lipase: 30 U/L (ref 11–51)

## 2022-08-31 LAB — TROPONIN I (HIGH SENSITIVITY)
Troponin I (High Sensitivity): 317 ng/L (ref <18)
Troponin I (High Sensitivity): 576 ng/L (ref <18)
Troponin I (High Sensitivity): 1031 ng/L (ref <18)

## 2022-08-31 LAB — T4, FREE: Free T4: 1.74 ng/dL — ABNORMAL HIGH (ref 0.61–1.12)

## 2022-08-31 LAB — PROTIME-INR
INR: 1 (ref 0.8–1.2)
Prothrombin Time: 13.2 seconds (ref 11.4–15.2)

## 2022-08-31 LAB — APTT: aPTT: 27 seconds (ref 24–36)

## 2022-08-31 MED ORDER — HEPARIN BOLUS VIA INFUSION
4000.0000 [IU] | Freq: Once | INTRAVENOUS | Status: AC
  Administered 2022-08-31: 4000 [IU] via INTRAVENOUS
  Filled 2022-08-31: qty 4000

## 2022-08-31 MED ORDER — ALBUTEROL SULFATE HFA 108 (90 BASE) MCG/ACT IN AERS: 2.0000 | INHALATION_SPRAY | Freq: Four times a day (QID) | RESPIRATORY_TRACT | Status: DC | PRN

## 2022-08-31 MED ORDER — ALBUTEROL SULFATE (2.5 MG/3ML) 0.083% IN NEBU: 2.5000 mg | INHALATION_SOLUTION | Freq: Four times a day (QID) | RESPIRATORY_TRACT | Status: DC | PRN

## 2022-08-31 MED ORDER — HEPARIN (PORCINE) 25000 UT/250ML-% IV SOLN
1600.0000 [IU]/h | INTRAVENOUS | Status: DC
  Administered 2022-08-31: 1000 [IU]/h via INTRAVENOUS
  Filled 2022-08-31: qty 250

## 2022-08-31 MED ORDER — ACETAMINOPHEN 325 MG PO TABS
650.0000 mg | ORAL_TABLET | ORAL | Status: DC | PRN
  Administered 2022-09-01: 650 mg via ORAL
  Filled 2022-08-31: qty 2

## 2022-08-31 MED ORDER — ONDANSETRON 4 MG PO TBDP: 4.0000 mg | ORAL_TABLET | Freq: Three times a day (TID) | ORAL | Status: DC | PRN

## 2022-08-31 MED ORDER — IOHEXOL 350 MG/ML SOLN
75.0000 mL | Freq: Once | INTRAVENOUS | Status: AC | PRN
  Administered 2022-08-31: 75 mL via INTRAVENOUS

## 2022-08-31 MED ORDER — NITROGLYCERIN 0.4 MG SL SUBL
0.4000 mg | SUBLINGUAL_TABLET | SUBLINGUAL | Status: DC | PRN
  Administered 2022-08-31: 0.4 mg via SUBLINGUAL
  Filled 2022-08-31: qty 1

## 2022-08-31 MED ORDER — ONDANSETRON HCL 4 MG/2ML IJ SOLN: 4.0000 mg | Freq: Four times a day (QID) | INTRAMUSCULAR | Status: DC | PRN

## 2022-08-31 MED ORDER — METHIMAZOLE 10 MG PO TABS
10.0000 mg | ORAL_TABLET | Freq: Every day | ORAL | Status: DC
  Filled 2022-08-31: qty 1

## 2022-08-31 MED ORDER — LACTATED RINGERS IV SOLN: INTRAVENOUS | Status: DC

## 2022-08-31 MED ORDER — FLUTICASONE PROPIONATE 50 MCG/ACT NA SUSP: 1.0000 | Freq: Every day | NASAL | Status: DC | PRN

## 2022-08-31 MED ORDER — NAPROXEN 500 MG PO TABS
500.0000 mg | ORAL_TABLET | Freq: Two times a day (BID) | ORAL | Status: DC | PRN
  Administered 2022-08-31: 500 mg via ORAL
  Filled 2022-08-31: qty 1

## 2022-08-31 MED ORDER — PANTOPRAZOLE SODIUM 40 MG PO TBEC: 40.0000 mg | DELAYED_RELEASE_TABLET | Freq: Every day | ORAL | Status: DC

## 2022-08-31 MED ORDER — PHENTERMINE HCL 37.5 MG PO TABS: 37.5000 mg | ORAL_TABLET | Freq: Every day | ORAL | Status: DC

## 2022-08-31 MED ORDER — ASPIRIN 81 MG PO CHEW
324.0000 mg | CHEWABLE_TABLET | Freq: Once | ORAL | Status: AC
  Administered 2022-08-31: 324 mg via ORAL
  Filled 2022-08-31: qty 4

## 2022-08-31 MED ORDER — MONTELUKAST SODIUM 10 MG PO TABS
10.0000 mg | ORAL_TABLET | Freq: Every day | ORAL | Status: DC
  Administered 2022-09-01: 10 mg via ORAL
  Filled 2022-08-31: qty 1

## 2022-08-31 NOTE — ED Notes (Signed)
Called for lab to draw blood cultures.

## 2022-08-31 NOTE — ED Notes (Signed)
First nurse note: Pt here via [facility] with c/o of CP for the past 2 weeks.   110/62 120 HR

## 2022-08-31 NOTE — ED Notes (Signed)
US at bedside

## 2022-08-31 NOTE — ED Notes (Signed)
Pt requesting something to eat. This RN explained to patient that the cafeteria is closed and either her visitor could go get her some food or we could get her a [place] sandwich tray. Pt states to this RN that she has multiple food allergies including mango, blueberry, wheat, soybeans, and eggs. This RN explained to patient that I'm unsure of the ingredients in the [place] sandwich tray. Pt states that she eats white bread at home as well as [place] and cheese. This RN informed patient that it was her choice if she ate the bread, but reminded her again that the ingredients in the bread is unknown.

## 2022-08-31 NOTE — Consult Note (Signed)
ANTICOAGULATION CONSULT NOTE - Initial Consult  Pharmacy Consult for heparin Indication: chest pain/ACS  Allergies  Allergen Reactions   Azithromycin Swelling and Other (See Comments)   Clindamycin Swelling   Codeine Hives and Other (See Comments)    Nausea/dizzy Nausea/dizzy   Meloxicam Other (See Comments)    Burns up from the inside    Neurontin [Gabapentin] Other (See Comments)    Makes her pass out and not remember what happened prior to taking med.   Penicillins Anaphylaxis    Patient allergic to all "cillins" Has patient had a PCN reaction causing immediate rash, facial/tongue/throat swelling, SOB or lightheadedness with hypotension: Yes Has patient had a PCN reaction causing severe rash involving mucus membranes or skin necrosis: Yes Has patient had a PCN reaction that required hospitalization: No Has patient had a PCN reaction occurring within the last 10 years: Yes If all of the above answers are "NO", then may proceed with Cephalosporin use.    Clindamycin/Lincomycin Swelling   Other Hives and Other (See Comments)    Berry flavored food and drinks   Diphenhydramine Hcl Rash and Other (See Comments)   Doxycycline Rash    Patient Measurements: Height: 5\' 7"  (170.2 cm) Weight: 99.8 kg (220 lb) IBW/kg (Calculated) : 61.6 Heparin Dosing Weight: 83.8  Vital Signs: Temp: 98.3 F (36.8 C) (05/28 1625) Temp Source: Oral (05/28 1625) BP: 108/90 (05/28 1850) Pulse Rate: 117 (05/28 1850)  Labs: Recent Labs    08/31/22 1633  HGB 13.7  HCT 43.3  PLT 287  CREATININE 0.62  TROPONINIHS 317*    Estimated Creatinine Clearance: 114.6 mL/min (by C-G formula based on SCr of 0.62 mg/dL).   Medical History: Past Medical History:  Diagnosis Date   Aneurysm (HCC) 01/04/2014   Arthritis    Asthma    WELL CONTROLLED   Brain aneurysm 2007   NEUROLOGY NOTE DOES NOT MENTION ANEURYSM BUT PT STATES SHE DID NOT HAVE TO HAVE SURGERY   Complication of anesthesia    FOR 1  C-SECTION PT WAS ITCHING AND VERY RED ON HER FACE   Cough, persistent 01/27/2016   Dermatitis due to sunburn 11/08/2013   Family history of adverse reaction to anesthesia    brother, neice and nephew got red in face with hives   Gallstones    GERD (gastroesophageal reflux disease)    Gonorrhea 06/20/2020   Headache    CHRONIC HEADACHES   History of chronic cough    DRY   Loss of memory 05/30/2017   Perforation of left tympanic membrane 05/17/2013   Pneumonia    PTSD (post-traumatic stress disorder)    PTSD (post-traumatic stress disorder)    Thyroid condition    PT WAS JUST TOLD ON 08-25-17 THAT SHE HAS A THYROID PROBLEM AND IS GOING TO F/U WITH ENDOCRINOLOGIST IN 2 WEEKS   Trichomonas vaginalis (TV) infection 06/20/2020    Medications:  No chronic anticoagulation PTA from chart review  Assessment: 37 y.o. female with PMH MDD/GAD, GERD, migraines, bipolar disorder who presents with chest pain and emesis. Troponins trending up, from 317 >> 576. ECG shows sinus tachycardia and currently in process of interpretation. Pharmacy has been consulted to initiate heparin for chest pain/ACS. Patient not on chronic anticoagulation per chart review.  Goal of Therapy:  Heparin level 0.3-0.7 units/ml Monitor platelets by anticoagulation protocol: Yes  Baseline Labs: aPTT - ordered; INR - ordered; Hgb - 13.7; PLT - 287  Date Time aPTT/HL Rate/Comment  Plan:  Give 4000 units bolus x1; then start heparin infusion at 1000 units/hr Check anti-Xa level in 6 hours and daily once consecutively therapeutic. Continue to monitor H&H and platelets daily while on heparin gtt.  Will M. Dareen Piano, PharmD PGY-1 Pharmacy Resident 08/31/2022 7:49 PM

## 2022-08-31 NOTE — ED Notes (Signed)
Lab aware and agreeable to all add-ons

## 2022-08-31 NOTE — ED Notes (Signed)
Pt on the phone cussing stating "They are forcing me to stay. They say my heart is fucking damaged and now I have to stay." This RN explained to patient that the doctor put in an order for blood cultures. Pt began yelling at this RN. This RN told patient that she would let the doctor come in and explain the cultures to her. MD notified.

## 2022-08-31 NOTE — ED Notes (Signed)
Pt states that she takes omeprazole before taking naproxen. States that naproxen gives her acid reflux. Messaged provider at this time

## 2022-08-31 NOTE — ED Triage Notes (Signed)
Pt to ED for chest pain for a couple weeks, emesis for 3 days.

## 2022-08-31 NOTE — ED Provider Notes (Signed)
[facility] Provider Note    Event Date/Time   First MD Initiated Contact with Patient 08/31/22 1729     (approximate)   History   Chest Pain   HPI  Elizabeth Spencer is a 37 y.o. female who presents to the emergency department today because of concerns for chest pain and emesis.  She has had the chest pain for the past 2 weeks.  It is located in the center part of her chest.  Some radiation to her left arm.  She states it feels like bricks.  Over the past 3 days she is also developed emesis.  She states that when she vomits it makes the pain worse.  She has had some shortness of breath although has not been a main concern.  Patient denies similar pain in the past.     Physical Exam   Triage Vital Signs: ED Triage Vitals  Enc Vitals Group     BP 08/31/22 1625 99/88     Pulse Rate 08/31/22 1625 (!) 123     Resp 08/31/22 1625 20     Temp 08/31/22 1625 98.3 F (36.8 C)     Temp Source 08/31/22 1625 Oral     SpO2 08/31/22 1625 94 %     Weight 08/31/22 1625 220 lb (99.8 kg)     Height 08/31/22 1625 5\' 7"  (1.702 m)     Head Circumference --      Peak Flow --      Pain Score 08/31/22 1630 6     Pain Loc --      Pain Edu? --      Excl. in GC? --     Most recent vital signs: Vitals:   08/31/22 1625 08/31/22 1737  BP: 99/88 120/79  Pulse: (!) 123 98  Resp: 20 18  Temp: 98.3 F (36.8 C)   SpO2: 94% 100%   General: Awake, alert, oriented. CV:  Good peripheral perfusion. Regular rate and rhythm. Resp:  Normal effort. Lungs clear. Abd:  No distention.   ED Results / Procedures / Treatments   Labs (all labs ordered are listed, but only abnormal results are displayed) Labs Reviewed  BASIC METABOLIC PANEL - Abnormal; Notable for the following components:      Result Value   Glucose, Bld 109 (*)    All other components within normal limits  TROPONIN I (HIGH SENSITIVITY) - Abnormal; Notable for the following components:   Troponin I (High  Sensitivity) 317 (*)    All other components within normal limits  CBC     EKG  I, Phineas Semen, attending physician, personally viewed and interpreted this EKG  EKG Time: 1623 Rate: 126 Rhythm: sinus tachycardia Axis: normal Intervals: qtc 420 QRS: narrow ST changes: no st elevation Impression: abnormal ekg   RADIOLOGY I independently interpreted and visualized the CXR. My interpretation: No pneumonia Radiology interpretation:  IMPRESSION:  No active cardiopulmonary disease.     PROCEDURES:  Critical Care performed: Yes  CRITICAL CARE Performed by: Phineas Semen   Total critical care time: 35 minutes  Critical care time was exclusive of separately billable procedures and treating other patients.  Critical care was necessary to treat or prevent imminent or life-threatening deterioration.  Critical care was time spent personally by me on the following activities: development of treatment plan with patient and/or surrogate as well as nursing, discussions with consultants, evaluation of patient's response to treatment, examination of patient, obtaining history from patient or surrogate, ordering  and performing treatments and interventions, ordering and review of laboratory studies, ordering and review of radiographic studies, pulse oximetry and re-evaluation of patient's condition.   Procedures    MEDICATIONS ORDERED IN ED: Medications - No data to display   IMPRESSION / MDM / ASSESSMENT AND PLAN / ED COURSE  I reviewed the triage vital signs and the nursing notes.                              Differential diagnosis includes, but is not limited to, gerd, acs, pe, dissection, costochondritis  Patient's presentation is most consistent with acute presentation with potential threat to life or bodily function.   The patient is on the cardiac monitor to evaluate for evidence of arrhythmia and/or significant heart rate changes.  Patient presented to the  emergency department today because of concerns for chest pain noted been going on for 2 weeks and emesis for 3 days.  On exam patient without any active emesis.  EKG without any ST elevation.  Patient's blood work however was notable for elevation in troponin.  I did obtain a CT angiogram.  Concern for possible blood clot.  This did not show any pulmonary embolism.  Did write for patient to start heparin.  Repeat troponin greater than initial.  Discussed with patient findings concern for possible heart attack.   And heart damage. Discussed with Dr. Allena Katz with the hospital service will plan on admission.     FINAL CLINICAL IMPRESSION(S) / ED DIAGNOSES   Final diagnoses:  NSTEMI (non-ST elevated myocardial infarction) [facility])   Note:  This document was prepared using Dragon voice recognition software and may include unintentional dictation errors.    Phineas Semen, MD 08/31/22 2131

## 2022-09-01 ENCOUNTER — Observation Stay (HOSPITAL_COMMUNITY)
Admit: 2022-09-01 | Discharge: 2022-09-01 | Disposition: A | Discharge status: Home / Self Care | Payer: Medicaid Other | Attending: Medical | Admitting: Medical

## 2022-09-01 ENCOUNTER — Encounter
Admission: EM | Disposition: A | Discharge status: Other Acute Inpatient Hospital | Payer: Self-pay | Source: Home / Self Care | Attending: Student

## 2022-09-01 ENCOUNTER — Other Ambulatory Visit: Payer: Self-pay

## 2022-09-01 ENCOUNTER — Inpatient Hospital Stay (HOSPITAL_COMMUNITY)
Admit: 2022-09-01 | Discharge: 2022-09-07 | DRG: 236 | Disposition: A | Discharge status: Home / Self Care | Payer: Medicaid Other | Source: Ambulatory Visit | Attending: Surgery | Admitting: Surgery

## 2022-09-01 ENCOUNTER — Inpatient Hospital Stay (HOSPITAL_COMMUNITY): Payer: Medicaid Other

## 2022-09-01 ENCOUNTER — Encounter (HOSPITAL_COMMUNITY): Payer: Self-pay

## 2022-09-01 ENCOUNTER — Encounter (HOSPITAL_COMMUNITY): Payer: Self-pay | Admitting: Cardiology

## 2022-09-01 ENCOUNTER — Inpatient Hospital Stay: Admit: 2022-09-01 | Payer: Medicaid Other | Admitting: Cardiology

## 2022-09-01 ENCOUNTER — Encounter: Payer: Self-pay | Admitting: Internal Medicine

## 2022-09-01 DIAGNOSIS — E162 Hypoglycemia, unspecified: Secondary | ICD-10-CM | POA: Diagnosis not present

## 2022-09-01 DIAGNOSIS — Z880 Allergy status to penicillin: Secondary | ICD-10-CM | POA: Diagnosis not present

## 2022-09-01 DIAGNOSIS — Z951 Presence of aortocoronary bypass graft: Secondary | ICD-10-CM

## 2022-09-01 DIAGNOSIS — Z886 Allergy status to analgesic agent status: Secondary | ICD-10-CM | POA: Diagnosis not present

## 2022-09-01 DIAGNOSIS — F4310 Post-traumatic stress disorder, unspecified: Secondary | ICD-10-CM | POA: Diagnosis present

## 2022-09-01 DIAGNOSIS — E876 Hypokalemia: Secondary | ICD-10-CM | POA: Diagnosis present

## 2022-09-01 DIAGNOSIS — E785 Hyperlipidemia, unspecified: Secondary | ICD-10-CM

## 2022-09-01 DIAGNOSIS — R079 Chest pain, unspecified: Secondary | ICD-10-CM

## 2022-09-01 DIAGNOSIS — J9382 Other air leak: Secondary | ICD-10-CM | POA: Diagnosis not present

## 2022-09-01 DIAGNOSIS — F319 Bipolar disorder, unspecified: Secondary | ICD-10-CM | POA: Diagnosis present

## 2022-09-01 DIAGNOSIS — I5022 Chronic systolic (congestive) heart failure: Secondary | ICD-10-CM | POA: Diagnosis present

## 2022-09-01 DIAGNOSIS — K219 Gastro-esophageal reflux disease without esophagitis: Secondary | ICD-10-CM | POA: Diagnosis present

## 2022-09-01 DIAGNOSIS — Z8249 Family history of ischemic heart disease and other diseases of the circulatory system: Secondary | ICD-10-CM

## 2022-09-01 DIAGNOSIS — Z885 Allergy status to narcotic agent status: Secondary | ICD-10-CM

## 2022-09-01 DIAGNOSIS — Z881 Allergy status to other antibiotic agents status: Secondary | ICD-10-CM | POA: Diagnosis not present

## 2022-09-01 DIAGNOSIS — D696 Thrombocytopenia, unspecified: Secondary | ICD-10-CM | POA: Diagnosis not present

## 2022-09-01 DIAGNOSIS — F419 Anxiety disorder, unspecified: Secondary | ICD-10-CM | POA: Diagnosis present

## 2022-09-01 DIAGNOSIS — R7881 Bacteremia: Secondary | ICD-10-CM | POA: Diagnosis present

## 2022-09-01 DIAGNOSIS — I259 Chronic ischemic heart disease, unspecified: Secondary | ICD-10-CM

## 2022-09-01 DIAGNOSIS — Z888 Allergy status to other drugs, medicaments and biological substances status: Secondary | ICD-10-CM

## 2022-09-01 DIAGNOSIS — I2089 Other forms of angina pectoris: Secondary | ICD-10-CM

## 2022-09-01 DIAGNOSIS — E782 Mixed hyperlipidemia: Secondary | ICD-10-CM | POA: Diagnosis present

## 2022-09-01 DIAGNOSIS — I214 Non-ST elevation (NSTEMI) myocardial infarction: Principal | ICD-10-CM

## 2022-09-01 DIAGNOSIS — I2510 Atherosclerotic heart disease of native coronary artery without angina pectoris: Secondary | ICD-10-CM

## 2022-09-01 DIAGNOSIS — J9811 Atelectasis: Secondary | ICD-10-CM | POA: Diagnosis not present

## 2022-09-01 DIAGNOSIS — Z91012 Allergy to eggs: Secondary | ICD-10-CM

## 2022-09-01 DIAGNOSIS — Z01810 Encounter for preprocedural cardiovascular examination: Secondary | ICD-10-CM | POA: Diagnosis not present

## 2022-09-01 DIAGNOSIS — I110 Hypertensive heart disease with heart failure: Secondary | ICD-10-CM | POA: Diagnosis present

## 2022-09-01 DIAGNOSIS — I252 Old myocardial infarction: Secondary | ICD-10-CM

## 2022-09-01 DIAGNOSIS — Z7982 Long term (current) use of aspirin: Secondary | ICD-10-CM | POA: Diagnosis not present

## 2022-09-01 DIAGNOSIS — M1990 Unspecified osteoarthritis, unspecified site: Secondary | ICD-10-CM | POA: Diagnosis present

## 2022-09-01 DIAGNOSIS — Z90710 Acquired absence of both cervix and uterus: Secondary | ICD-10-CM | POA: Diagnosis not present

## 2022-09-01 DIAGNOSIS — Z6837 Body mass index (BMI) 37.0-37.9, adult: Secondary | ICD-10-CM

## 2022-09-01 DIAGNOSIS — I255 Ischemic cardiomyopathy: Secondary | ICD-10-CM | POA: Diagnosis not present

## 2022-09-01 DIAGNOSIS — D6851 Activated protein C resistance: Secondary | ICD-10-CM | POA: Diagnosis present

## 2022-09-01 DIAGNOSIS — E669 Obesity, unspecified: Secondary | ICD-10-CM | POA: Diagnosis present

## 2022-09-01 DIAGNOSIS — Z79899 Other long term (current) drug therapy: Secondary | ICD-10-CM

## 2022-09-01 DIAGNOSIS — E039 Hypothyroidism, unspecified: Secondary | ICD-10-CM | POA: Diagnosis present

## 2022-09-01 DIAGNOSIS — Z811 Family history of alcohol abuse and dependence: Secondary | ICD-10-CM

## 2022-09-01 DIAGNOSIS — J45909 Unspecified asthma, uncomplicated: Secondary | ICD-10-CM | POA: Diagnosis present

## 2022-09-01 DIAGNOSIS — E0520 Thyrotoxicosis with toxic multinodular goiter without thyrotoxic crisis or storm: Secondary | ICD-10-CM | POA: Diagnosis present

## 2022-09-01 DIAGNOSIS — F1490 Cocaine use, unspecified, uncomplicated: Secondary | ICD-10-CM | POA: Diagnosis not present

## 2022-09-01 DIAGNOSIS — Z825 Family history of asthma and other chronic lower respiratory diseases: Secondary | ICD-10-CM

## 2022-09-01 DIAGNOSIS — Z91018 Allergy to other foods: Secondary | ICD-10-CM

## 2022-09-01 DIAGNOSIS — F17210 Nicotine dependence, cigarettes, uncomplicated: Secondary | ICD-10-CM | POA: Diagnosis present

## 2022-09-01 DIAGNOSIS — K760 Fatty (change of) liver, not elsewhere classified: Secondary | ICD-10-CM | POA: Diagnosis present

## 2022-09-01 DIAGNOSIS — F1410 Cocaine abuse, uncomplicated: Secondary | ICD-10-CM | POA: Diagnosis present

## 2022-09-01 HISTORY — PX: LEFT HEART CATH AND CORONARY ANGIOGRAPHY: CATH118249

## 2022-09-01 LAB — COMPREHENSIVE METABOLIC PANEL
ALT: 34 U/L (ref 0–44)
AST: 40 U/L (ref 15–41)
Albumin: 3.4 g/dL — ABNORMAL LOW (ref 3.5–5.0)
Alkaline Phosphatase: 64 U/L (ref 38–126)
Anion gap: 5 (ref 5–15)
BUN: 7 mg/dL (ref 6–20)
CO2: 25 mmol/L (ref 22–32)
Calcium: 8.3 mg/dL — ABNORMAL LOW (ref 8.9–10.3)
Chloride: 108 mmol/L (ref 98–111)
Creatinine, Ser: 0.55 mg/dL (ref 0.44–1.00)
GFR, Estimated: >60 mL/min (ref >60)
Glucose, Bld: 98 mg/dL (ref 70–99)
Potassium: 3.2 mmol/L — ABNORMAL LOW (ref 3.5–5.1)
Sodium: 138 mmol/L (ref 135–145)
Total Bilirubin: 0.5 mg/dL (ref 0.3–1.2)
Total Protein: 6 g/dL — ABNORMAL LOW (ref 6.5–8.1)

## 2022-09-01 LAB — BLOOD CULTURE ID PANEL (REFLEXED) - BCID2

## 2022-09-01 LAB — TSH: TSH: 0.018 u[IU]/mL — ABNORMAL LOW (ref 0.350–4.500)

## 2022-09-01 LAB — SURGICAL PCR SCREEN
MRSA, PCR: NEGATIVE
Staphylococcus aureus: NEGATIVE

## 2022-09-01 LAB — CBC
HCT: 38.5 % (ref 36.0–46.0)
Hemoglobin: 12.4 g/dL (ref 12.0–15.0)
MCH: 29.3 pg (ref 26.0–34.0)
MCHC: 32.2 g/dL (ref 30.0–36.0)
MCV: 91 fL (ref 80.0–100.0)
Platelets: 237 10*3/uL (ref 150–400)
RBC: 4.23 MIL/uL (ref 3.87–5.11)
RDW: 12.8 % (ref 11.5–15.5)
WBC: 8.4 10*3/uL (ref 4.0–10.5)
nRBC: 0 % (ref 0.0–0.2)

## 2022-09-01 LAB — ECHOCARDIOGRAM COMPLETE
AR max vel: 2.45 cm2
AV Area VTI: 3.36 cm2
AV Area mean vel: 2.63 cm2
AV Mean grad: 3 mmHg
AV Peak grad: 6 mmHg
Ao pk vel: 1.22 m/s
Area-P 1/2: 6.83 cm2
Calc EF: 26.5 %
Height: 67 in
S' Lateral: 4.5 cm
Single Plane A2C EF: 30.3 %
Single Plane A4C EF: 24.5 %
Weight: 3520.31 oz

## 2022-09-01 LAB — TROPONIN I (HIGH SENSITIVITY)
Troponin I (High Sensitivity): 1163 ng/L (ref <18)
Troponin I (High Sensitivity): 687 ng/L (ref <18)
Troponin I (High Sensitivity): 765 ng/L (ref <18)

## 2022-09-01 LAB — MAGNESIUM: Magnesium: 1.9 mg/dL (ref 1.7–2.4)

## 2022-09-01 LAB — T4, FREE: Free T4: 2.13 ng/dL — ABNORMAL HIGH (ref 0.61–1.12)

## 2022-09-01 LAB — VAS US DOPPLER PRE CABG
Left ABI: 1.1
Right ABI: 1.15

## 2022-09-01 LAB — HEPARIN LEVEL (UNFRACTIONATED)
Heparin Unfractionated: <0.1 IU/mL — ABNORMAL LOW (ref 0.30–0.70)
Heparin Unfractionated: <0.1 IU/mL — ABNORMAL LOW (ref 0.30–0.70)

## 2022-09-01 LAB — CULTURE, BLOOD (ROUTINE X 2)

## 2022-09-01 LAB — PHOSPHORUS: Phosphorus: 4 mg/dL (ref 2.5–4.6)

## 2022-09-01 SURGERY — LEFT HEART CATH AND CORONARY ANGIOGRAPHY
Anesthesia: Moderate Sedation

## 2022-09-01 MED ORDER — LIDOCAINE HCL (PF) 1 % IJ SOLN
INTRAMUSCULAR | Status: DC | PRN
  Administered 2022-09-01: 2 mL

## 2022-09-01 MED ORDER — TRANEXAMIC ACID (OHS) PUMP PRIME SOLUTION
2.0000 mg/kg | INTRAVENOUS | Status: DC
  Filled 2022-09-01: qty 2

## 2022-09-01 MED ORDER — HEPARIN BOLUS VIA INFUSION
2500.0000 [IU] | Freq: Once | INTRAVENOUS | Status: AC
  Administered 2022-09-01: 2500 [IU] via INTRAVENOUS
  Filled 2022-09-01: qty 2500

## 2022-09-01 MED ORDER — NITROGLYCERIN IN D5W 200-5 MCG/ML-% IV SOLN
INTRAVENOUS | Status: DC | PRN
  Administered 2022-09-01: 10 ug/min via INTRAVENOUS

## 2022-09-01 MED ORDER — HYDRALAZINE HCL 20 MG/ML IJ SOLN: 10.0000 mg | INTRAMUSCULAR | Status: DC | PRN

## 2022-09-01 MED ORDER — FENTANYL CITRATE (PF) 100 MCG/2ML IJ SOLN
INTRAMUSCULAR | Status: AC
  Filled 2022-09-01: qty 2

## 2022-09-01 MED ORDER — VANCOMYCIN HCL 2000 MG/400ML IV SOLN
2000.0000 mg | Freq: Once | INTRAVENOUS | Status: DC
  Filled 2022-09-01: qty 400

## 2022-09-01 MED ORDER — CHLORHEXIDINE GLUCONATE CLOTH 2 % EX PADS
6.0000 | MEDICATED_PAD | Freq: Once | CUTANEOUS | Status: AC
  Administered 2022-09-02: 6 via TOPICAL

## 2022-09-01 MED ORDER — FUROSEMIDE 10 MG/ML IJ SOLN
INTRAMUSCULAR | Status: DC | PRN
  Administered 2022-09-01: 20 mg via INTRAVENOUS

## 2022-09-01 MED ORDER — NOREPINEPHRINE 4 MG/250ML-% IV SOLN
0.0000 ug/min | INTRAVENOUS | Status: DC
  Filled 2022-09-01: qty 250

## 2022-09-01 MED ORDER — IOHEXOL 300 MG/ML  SOLN
INTRAMUSCULAR | Status: DC | PRN
  Administered 2022-09-01: 61 mL

## 2022-09-01 MED ORDER — SODIUM CHLORIDE 0.9 % IV SOLN: 250.0000 mL | INTRAVENOUS | Status: DC | PRN

## 2022-09-01 MED ORDER — SODIUM CHLORIDE 0.9% FLUSH
3.0000 mL | Freq: Two times a day (BID) | INTRAVENOUS | Status: DC
  Administered 2022-09-01: 3 mL via INTRAVENOUS

## 2022-09-01 MED ORDER — HEPARIN (PORCINE) 25000 UT/250ML-% IV SOLN: 1600.0000 [IU]/h | INTRAVENOUS | Status: DC

## 2022-09-01 MED ORDER — PHENYLEPHRINE HCL-NACL 20-0.9 MG/250ML-% IV SOLN
30.0000 ug/min | INTRAVENOUS | Status: DC
  Filled 2022-09-01: qty 250

## 2022-09-01 MED ORDER — CHLORHEXIDINE GLUCONATE CLOTH 2 % EX PADS
6.0000 | MEDICATED_PAD | Freq: Once | CUTANEOUS | Status: AC
  Administered 2022-09-01: 6 via TOPICAL

## 2022-09-01 MED ORDER — PANTOPRAZOLE SODIUM 40 MG PO TBEC
40.0000 mg | DELAYED_RELEASE_TABLET | Freq: Two times a day (BID) | ORAL | Status: DC
  Administered 2022-09-01: 40 mg via ORAL
  Filled 2022-09-01: qty 1

## 2022-09-01 MED ORDER — SODIUM CHLORIDE 0.9% FLUSH: 3.0000 mL | INTRAVENOUS | Status: DC | PRN

## 2022-09-01 MED ORDER — ATORVASTATIN CALCIUM 40 MG PO TABS: 40.0000 mg | ORAL_TABLET | Freq: Every day | ORAL | Status: DC

## 2022-09-01 MED ORDER — TEMAZEPAM 15 MG PO CAPS
15.0000 mg | ORAL_CAPSULE | Freq: Once | ORAL | Status: AC | PRN
  Administered 2022-09-02: 15 mg via ORAL
  Filled 2022-09-01: qty 1

## 2022-09-01 MED ORDER — HEPARIN SODIUM (PORCINE) 1000 UNIT/ML IJ SOLN
INTRAMUSCULAR | Status: DC | PRN
  Administered 2022-09-01: 5000 [IU] via INTRAVENOUS

## 2022-09-01 MED ORDER — CHLORHEXIDINE GLUCONATE 0.12 % MT SOLN
15.0000 mL | Freq: Once | OROMUCOSAL | Status: AC
  Administered 2022-09-02: 15 mL via OROMUCOSAL
  Filled 2022-09-01: qty 15

## 2022-09-01 MED ORDER — DEXMEDETOMIDINE HCL IN NACL 400 MCG/100ML IV SOLN
0.1000 ug/kg/h | INTRAVENOUS | Status: DC
  Filled 2022-09-01: qty 100

## 2022-09-01 MED ORDER — PLASMA-LYTE A IV SOLN
INTRAVENOUS | Status: DC
  Filled 2022-09-01: qty 2.5

## 2022-09-01 MED ORDER — MIDAZOLAM HCL 2 MG/2ML IJ SOLN
INTRAMUSCULAR | Status: AC
  Filled 2022-09-01: qty 2

## 2022-09-01 MED ORDER — POTASSIUM CHLORIDE CRYS ER 20 MEQ PO TBCR: 40.0000 meq | EXTENDED_RELEASE_TABLET | Freq: Once | ORAL | Status: DC

## 2022-09-01 MED ORDER — LEVOFLOXACIN IN D5W 500 MG/100ML IV SOLN
500.0000 mg | INTRAVENOUS | Status: DC
  Filled 2022-09-01: qty 100

## 2022-09-01 MED ORDER — TRANEXAMIC ACID (OHS) BOLUS VIA INFUSION
15.0000 mg/kg | INTRAVENOUS | Status: DC
  Filled 2022-09-01: qty 1497

## 2022-09-01 MED ORDER — NITROGLYCERIN 0.4 MG SL SUBL: 0.4000 mg | SUBLINGUAL_TABLET | SUBLINGUAL | Status: DC | PRN

## 2022-09-01 MED ORDER — CHLORHEXIDINE GLUCONATE CLOTH 2 % EX PADS
6.0000 | MEDICATED_PAD | Freq: Every day | CUTANEOUS | Status: DC
  Administered 2022-09-01: 6 via TOPICAL

## 2022-09-01 MED ORDER — METOPROLOL TARTRATE 25 MG PO TABS
25.0000 mg | ORAL_TABLET | Freq: Two times a day (BID) | ORAL | Status: DC
  Administered 2022-09-01: 25 mg via ORAL
  Filled 2022-09-01: qty 1

## 2022-09-01 MED ORDER — ASPIRIN 81 MG PO CHEW: 81.0000 mg | CHEWABLE_TABLET | Freq: Every day | ORAL | Status: DC

## 2022-09-01 MED ORDER — MORPHINE SULFATE (PF) 2 MG/ML IV SOLN
2.0000 mg | INTRAVENOUS | Status: DC | PRN
  Administered 2022-09-01: 2 mg via INTRAVENOUS
  Filled 2022-09-01: qty 1

## 2022-09-01 MED ORDER — HEPARIN (PORCINE) IN NACL 1000-0.9 UT/500ML-% IV SOLN
INTRAVENOUS | Status: AC
  Filled 2022-09-01: qty 1000

## 2022-09-01 MED ORDER — ASPIRIN 81 MG PO TBEC: 81.0000 mg | DELAYED_RELEASE_TABLET | Freq: Every day | ORAL | Status: DC

## 2022-09-01 MED ORDER — BISACODYL 5 MG PO TBEC: 5.0000 mg | DELAYED_RELEASE_TABLET | Freq: Once | ORAL | Status: DC

## 2022-09-01 MED ORDER — HYDROMORPHONE HCL 1 MG/ML IJ SOLN
0.5000 mg | Freq: Once | INTRAMUSCULAR | Status: AC
  Administered 2022-09-01: 0.5 mg via INTRAVENOUS
  Filled 2022-09-01: qty 0.5

## 2022-09-01 MED ORDER — NITROGLYCERIN IN D5W 200-5 MCG/ML-% IV SOLN
2.0000 ug/min | INTRAVENOUS | Status: DC
  Filled 2022-09-01: qty 250

## 2022-09-01 MED ORDER — POTASSIUM CHLORIDE 2 MEQ/ML IV SOLN
80.0000 meq | INTRAVENOUS | Status: DC
  Filled 2022-09-01: qty 40

## 2022-09-01 MED ORDER — MAGNESIUM SULFATE 50 % IJ SOLN
40.0000 meq | INTRAMUSCULAR | Status: DC
  Filled 2022-09-01: qty 9.85

## 2022-09-01 MED ORDER — EPINEPHRINE HCL 5 MG/250ML IV SOLN IN NS
0.0000 ug/min | INTRAVENOUS | Status: DC
  Filled 2022-09-01: qty 250

## 2022-09-01 MED ORDER — VERAPAMIL HCL 2.5 MG/ML IV SOLN
INTRAVENOUS | Status: AC
  Filled 2022-09-01: qty 2

## 2022-09-01 MED ORDER — NITROGLYCERIN 0.4 MG SL SUBL: 0.4000 mg | SUBLINGUAL_TABLET | SUBLINGUAL | 12 refills | Status: DC | PRN

## 2022-09-01 MED ORDER — TRANEXAMIC ACID 1000 MG/10ML IV SOLN
1.5000 mg/kg/h | INTRAVENOUS | Status: DC
  Filled 2022-09-01: qty 25

## 2022-09-01 MED ORDER — LABETALOL HCL 5 MG/ML IV SOLN: 10.0000 mg | INTRAVENOUS | Status: DC | PRN

## 2022-09-01 MED ORDER — METOPROLOL TARTRATE 12.5 MG HALF TABLET
12.5000 mg | ORAL_TABLET | Freq: Once | ORAL | Status: AC
  Administered 2022-09-02: 12.5 mg via ORAL
  Filled 2022-09-01: qty 1

## 2022-09-01 MED ORDER — FENTANYL CITRATE (PF) 100 MCG/2ML IJ SOLN
INTRAMUSCULAR | Status: DC | PRN
  Administered 2022-09-01 (×2): 25 ug via INTRAVENOUS

## 2022-09-01 MED ORDER — SODIUM CHLORIDE 0.9% FLUSH: 3.0000 mL | Freq: Two times a day (BID) | INTRAVENOUS | Status: DC

## 2022-09-01 MED ORDER — ASPIRIN 81 MG PO CHEW
81.0000 mg | CHEWABLE_TABLET | Freq: Every day | ORAL | Status: DC
  Administered 2022-09-01: 81 mg via ORAL
  Filled 2022-09-01: qty 1

## 2022-09-01 MED ORDER — HEPARIN SODIUM (PORCINE) 1000 UNIT/ML IJ SOLN
INTRAMUSCULAR | Status: AC
  Filled 2022-09-01: qty 10

## 2022-09-01 MED ORDER — POTASSIUM CHLORIDE 10 MEQ/100ML IV SOLN
10.0000 meq | INTRAVENOUS | Status: AC
  Administered 2022-09-01: 10 meq via INTRAVENOUS
  Filled 2022-09-01: qty 100

## 2022-09-01 MED ORDER — NITROGLYCERIN IN D5W 200-5 MCG/ML-% IV SOLN: 0.0000 ug/min | INTRAVENOUS | Status: DC

## 2022-09-01 MED ORDER — VERAPAMIL HCL 2.5 MG/ML IV SOLN
INTRAVENOUS | Status: DC | PRN
  Administered 2022-09-01: 2.5 mg via INTRAVENOUS

## 2022-09-01 MED ORDER — DIAZEPAM 5 MG PO TABS
5.0000 mg | ORAL_TABLET | Freq: Once | ORAL | Status: AC
  Administered 2022-09-02: 5 mg via ORAL
  Filled 2022-09-01: qty 1

## 2022-09-01 MED ORDER — METHIMAZOLE 10 MG PO TABS
10.0000 mg | ORAL_TABLET | Freq: Every day | ORAL | Status: DC
  Filled 2022-09-01: qty 1

## 2022-09-01 MED ORDER — FUROSEMIDE 10 MG/ML IJ SOLN: 20.0000 mg | Freq: Every day | INTRAMUSCULAR | Status: DC

## 2022-09-01 MED ORDER — SODIUM CHLORIDE 0.9 % WEIGHT BASED INFUSION: 3.0000 mL/kg/h | INTRAVENOUS | Status: DC

## 2022-09-01 MED ORDER — HEPARIN (PORCINE) 25000 UT/250ML-% IV SOLN
1000.0000 [IU]/h | INTRAVENOUS | Status: DC
  Administered 2022-09-01: 1000 [IU]/h via INTRAVENOUS
  Filled 2022-09-01: qty 250

## 2022-09-01 MED ORDER — HEPARIN 30,000 UNITS/1000 ML (OHS) CELLSAVER SOLUTION
Status: DC
  Filled 2022-09-01: qty 1000

## 2022-09-01 MED ORDER — ASPIRIN 81 MG PO CHEW
CHEWABLE_TABLET | ORAL | Status: AC
  Filled 2022-09-01: qty 1

## 2022-09-01 MED ORDER — METOPROLOL TARTRATE 25 MG PO TABS: 25.0000 mg | ORAL_TABLET | Freq: Two times a day (BID) | ORAL | Status: DC

## 2022-09-01 MED ORDER — ATORVASTATIN CALCIUM 20 MG PO TABS
40.0000 mg | ORAL_TABLET | Freq: Every day | ORAL | Status: DC
  Administered 2022-09-01: 40 mg via ORAL
  Filled 2022-09-01 (×2): qty 2

## 2022-09-01 MED ORDER — INSULIN REGULAR(HUMAN) IN NACL 100-0.9 UT/100ML-% IV SOLN
INTRAVENOUS | Status: DC
  Filled 2022-09-01: qty 100

## 2022-09-01 MED ORDER — MILRINONE LACTATE IN DEXTROSE 20-5 MG/100ML-% IV SOLN
0.3000 ug/kg/min | INTRAVENOUS | Status: DC
  Filled 2022-09-01: qty 100

## 2022-09-01 MED ORDER — ONDANSETRON HCL 4 MG/2ML IJ SOLN
4.0000 mg | Freq: Four times a day (QID) | INTRAMUSCULAR | Status: DC | PRN
  Administered 2022-09-01: 4 mg via INTRAVENOUS
  Filled 2022-09-01: qty 2

## 2022-09-01 MED ORDER — ASPIRIN 81 MG PO CHEW: 81.0000 mg | CHEWABLE_TABLET | ORAL | Status: DC

## 2022-09-01 MED ORDER — VANCOMYCIN HCL 1500 MG/300ML IV SOLN
1500.0000 mg | INTRAVENOUS | Status: DC
  Filled 2022-09-01: qty 300

## 2022-09-01 MED ORDER — HEPARIN (PORCINE) IN NACL 1000-0.9 UT/500ML-% IV SOLN
INTRAVENOUS | Status: DC | PRN
  Administered 2022-09-01 (×2): 500 mL

## 2022-09-01 MED ORDER — MIDAZOLAM HCL 2 MG/2ML IJ SOLN
INTRAMUSCULAR | Status: DC | PRN
  Administered 2022-09-01: 1 mg via INTRAVENOUS
  Administered 2022-09-01: 2 mg via INTRAVENOUS

## 2022-09-01 MED ORDER — ACETAMINOPHEN 325 MG PO TABS
650.0000 mg | ORAL_TABLET | ORAL | Status: DC | PRN
  Administered 2022-09-01: 650 mg via ORAL
  Filled 2022-09-01: qty 2

## 2022-09-01 MED ORDER — SODIUM CHLORIDE 0.9 % WEIGHT BASED INFUSION: 1.0000 mL/kg/h | INTRAVENOUS | Status: DC

## 2022-09-01 SURGICAL SUPPLY — 13 items
CATH 5F 110X4 TIG (CATHETERS) IMPLANT
CATH INFINITI JR4 5F (CATHETERS) IMPLANT
DEVICE RAD TR BAND REGULAR (VASCULAR PRODUCTS) IMPLANT
DRAPE BRACHIAL (DRAPES) IMPLANT
GLIDESHEATH SLEND A-KIT 6F 22G (SHEATH) IMPLANT
GUIDEWIRE INQWIRE 1.5J.035X260 (WIRE) IMPLANT
INQWIRE 1.5J .035X260CM (WIRE) ×1
PACK CARDIAC CATH (CUSTOM PROCEDURE TRAY) ×1 IMPLANT
PROTECTION STATION PRESSURIZED (MISCELLANEOUS) ×1
SET ATX-X65L (MISCELLANEOUS) IMPLANT
SHEATH GLIDE SLENDER 4/5FR (SHEATH) IMPLANT
STATION PROTECTION PRESSURIZED (MISCELLANEOUS) IMPLANT
WIRE HITORQ VERSACORE ST 145CM (WIRE) IMPLANT

## 2022-09-01 NOTE — Progress Notes (Signed)
PHARMACY - PHYSICIAN COMMUNICATION CRITICAL VALUE ALERT - BLOOD CULTURE IDENTIFICATION (BCID)  Elizabeth Spencer is an 37 y.o. female who presented to [facility] on 08/31/2022 with a chief complaint of chest pain  Assessment:  Patient admitted for ACS with chest pain and emesis.  (include suspected source if known)  Name of physician (or Provider) Contacted: Dr Gillis Santa  Current antibiotics: none  Changes to prescribed antibiotics recommended:  Discussed with ID pharmacist and MD. Will repeat blood cultures and start Vancomycin for now. Noted patient multiple allergies.   Results for orders placed or performed during the hospital encounter of 08/31/22  Blood Culture ID Panel (Reflexed) (Collected: 08/31/2022  9:01 PM)  Result Value Ref Range   Enterococcus faecalis NOT DETECTED NOT DETECTED   Enterococcus Faecium NOT DETECTED NOT DETECTED   Listeria monocytogenes NOT DETECTED NOT DETECTED   Staphylococcus species NOT DETECTED NOT DETECTED   Staphylococcus aureus (BCID) NOT DETECTED NOT DETECTED   Staphylococcus epidermidis NOT DETECTED NOT DETECTED   Staphylococcus lugdunensis NOT DETECTED NOT DETECTED   Streptococcus species DETECTED (A) NOT DETECTED   Streptococcus agalactiae NOT DETECTED NOT DETECTED   Streptococcus pneumoniae NOT DETECTED NOT DETECTED   Streptococcus pyogenes NOT DETECTED NOT DETECTED   A.calcoaceticus-baumannii NOT DETECTED NOT DETECTED   Bacteroides fragilis NOT DETECTED NOT DETECTED   Enterobacterales NOT DETECTED NOT DETECTED   Enterobacter cloacae complex NOT DETECTED NOT DETECTED   Escherichia coli NOT DETECTED NOT DETECTED   Klebsiella aerogenes NOT DETECTED NOT DETECTED   Klebsiella oxytoca NOT DETECTED NOT DETECTED   Klebsiella pneumoniae NOT DETECTED NOT DETECTED   Proteus species NOT DETECTED NOT DETECTED   Salmonella species NOT DETECTED NOT DETECTED   Serratia marcescens NOT DETECTED NOT DETECTED   Haemophilus influenzae NOT DETECTED NOT  DETECTED   Neisseria meningitidis NOT DETECTED NOT DETECTED   Pseudomonas aeruginosa NOT DETECTED NOT DETECTED   Stenotrophomonas maltophilia NOT DETECTED NOT DETECTED   Candida albicans NOT DETECTED NOT DETECTED   Candida auris NOT DETECTED NOT DETECTED   Candida glabrata NOT DETECTED NOT DETECTED   Candida krusei NOT DETECTED NOT DETECTED   Candida parapsilosis NOT DETECTED NOT DETECTED   Candida tropicalis NOT DETECTED NOT DETECTED   Cryptococcus neoformans/gattii NOT DETECTED NOT DETECTED    Elizabeth Spencer PharmD, BCPS 09/01/2022 12:06 PM

## 2022-09-01 NOTE — ED Notes (Signed)
Messaged provider about patients chest pain. Pt skeptical over medications that are not her normal medications. Pt requesting naproxen for her chest pain. Pt states that she has to take omeprazole before naproxen as naproxen causes her to have acid reflux. All this information passed onto provider.

## 2022-09-01 NOTE — Consult Note (Signed)
 Cardiology Consultation   Patient ID: Elizabeth Spencer MRN: [id number]; DOB: 03/17/1986  Admit date: 08/31/2022 Date of Consult: 09/01/2022  PCP:  Shirley, Amanda M, FNP   [facility] Providers Cardiologist:  New  Patient Profile:   Elizabeth Spencer is a 37 y.o. female with a hx of bipolar affective disorder, GERD, migraines, chronic low back pain, B1 def due to diet, prediabetes, mixed HLD, Vit D def, h/o drug use, tobacco use who is being seen 09/01/2022 for the evaluation of chest pain at the request of Dr. Kumar.  History of Present Illness:   [name] has not been seen by cardiology in the past. She reports family history of father with heart issues (died last year). She reports history of drug use. She has been drinking ?Mycards weekly (85 alcohol each drink). She reports she was recently diagnosed with Factor 5 Leiden and was going to be started on a blood thinner. Reports no recent drug use. She smokes. Reports no chance she could be pregnant.   Patient presented to the ER on 08/31/2022 with chest pain.  She reported chest pain since Mothers day.  It is located in the center part of her chest nd has been intermittent for the last few weeks.  Said episodes were lasting up to 30 minutes and slowly got worse.  Also reports associated nausea and vomiting. Said she had been vomiting for the last  days. She denies fever or LLE.   In the ER blood pressure 99/88, pulse rate 123 bpm, respiratory rate 20, afebrile, 94% O2.  Labs showed high-sensitivity troponin 317, 576, 1031, 1163.  BMET showed Scr 0.6, BUN 7, K3.5, WBC .8.8, Hgb 13.7. Normal LFTs. Lipase 30. Chest x-ray was nonacute.  Chest CT showed no PE, + cholelithiasis.  Ultrasound right upper quadrant with no definite findings of acute cholecystitis. UDS +cocaine. The patient was started on IV heparin and admitted for further work-up.  Past Medical History:  Diagnosis Date   Aneurysm (HCC) 01/04/2014   Arthritis    Asthma     WELL CONTROLLED   Brain aneurysm 2007   NEUROLOGY NOTE DOES NOT MENTION ANEURYSM BUT PT STATES SHE DID NOT HAVE TO HAVE SURGERY   Complication of anesthesia    FOR 1 C-SECTION PT WAS ITCHING AND VERY RED ON HER FACE   Cough, persistent 01/27/2016   Dermatitis due to sunburn 11/08/2013   Family history of adverse reaction to anesthesia    brother, neice and nephew got red in face with hives   Gallstones    GERD (gastroesophageal reflux disease)    Gonorrhea 06/20/2020   Headache    CHRONIC HEADACHES   History of chronic cough    DRY   Loss of memory 05/30/2017   Perforation of left tympanic membrane 05/17/2013   Pneumonia    PTSD (post-traumatic stress disorder)    PTSD (post-traumatic stress disorder)    Thyroid condition    PT WAS JUST TOLD ON 08-25-17 THAT SHE HAS A THYROID PROBLEM AND IS GOING TO F/U WITH ENDOCRINOLOGIST IN 2 WEEKS   Trichomonas vaginalis (TV) infection 06/20/2020    Past Surgical History:  Procedure Laterality Date   ABDOMINAL HYSTERECTOMY     CESAREAN SECTION     x3   CYSTOSCOPY N/A 09/29/2017   Procedure: CYSTOSCOPY;  Surgeon: Staebler, Andreas, MD;  Location: [facility] ORS;  Service: Gynecology;  Laterality: N/A;   CYSTOSCOPY N/A 11/05/2017   Procedure: CYSTOSCOPY;  Surgeon: Staebler, Andreas, MD;  Location: [facility]   ORS;  Service: Gynecology;  Laterality: N/A;   HYSTEROSCOPY WITH D & C N/A 09/01/2017   Procedure: DILATATION AND CURETTAGE /HYSTEROSCOPY;  Surgeon: Staebler, Andreas, MD;  Location: [facility] ORS;  Service: Gynecology;  Laterality: N/A;   LAPAROSCOPY N/A 09/01/2017   Procedure: LAPAROSCOPY OPERATIVE with biopsy;  Surgeon: Staebler, Andreas, MD;  Location: [facility] ORS;  Service: Gynecology;  Laterality: N/A;   LAPAROSCOPY N/A 11/05/2017   Procedure: LAPAROSCOPY DIAGNOSTIC;  Surgeon: Staebler, Andreas, MD;  Location: [facility] ORS;  Service: Gynecology;  Laterality: N/A;   REPAIR VAGINAL CUFF N/A 11/05/2017   Procedure: REPAIR VAGINAL CUFF;  Surgeon: Staebler, Andreas,  MD;  Location: [facility] ORS;  Service: Gynecology;  Laterality: N/A;   TOTAL LAPAROSCOPIC HYSTERECTOMY WITH SALPINGECTOMY Bilateral 09/29/2017   Procedure: TOTAL LAPAROSCOPIC HYSTERECTOMY WITH SALPINGECTOMY;  Surgeon: Staebler, Andreas, MD;  Location: [facility] ORS;  Service: Gynecology;  Laterality: Bilateral;     Home Medications:  Prior to Admission medications   Medication Sig Start Date End Date Taking? Authorizing Provider  methimazole (TAPAZOLE) 5 MG tablet Take by mouth. 08/31/18  Yes [provider]  naproxen (NAPROSYN) 500 MG tablet Take 500 mg by mouth 2 (two) times daily as needed for moderate pain or headache. 11/12/20  Yes [provider]  omeprazole (PRILOSEC) 20 MG capsule Take 1 capsule (20 mg total) by mouth 2 (two) times daily before a meal. 06/17/22  Yes Shirley, Amanda M, FNP  albuterol (VENTOLIN HFA) 108 (90 Base) MCG/ACT inhaler Inhale 2 puffs into the lungs every 6 (six) hours as needed for wheezing or shortness of breath. 06/17/22   Shirley, Amanda M, FNP  cetirizine (ZYRTEC) 10 MG tablet Take 1 tablet (10 mg total) by mouth daily. 06/17/22   Shirley, Amanda M, FNP  EPIPEN 2-PAK 0.3 MG/0.3ML SOAJ injection INJECT INTO THIGH MUSCLE THROUGH CLOTHES AS NEEDED SEVERE ALLERGIC REACTION 06/17/22   Shirley, Amanda M, FNP  fluconazole (DIFLUCAN) 150 MG tablet Take 150 mg by mouth every 3 (three) days. 01/23/20   [provider]  fluticasone (FLONASE) 50 MCG/ACT nasal spray 1 spray by Each Nare route daily. 06/17/22   Shirley, Amanda M, FNP  hydrOXYzine (ATARAX/VISTARIL) 25 MG tablet Take 25 mg by mouth 3 (three) times daily. 01/02/20   [provider]  montelukast (SINGULAIR) 10 MG tablet Take 1 tablet (10 mg total) by mouth daily. 06/17/22   Shirley, Amanda M, FNP  Multiple Vitamins-Minerals (CENTRUM ADULTS PO) Take 1 tablet by mouth at bedtime.     [provider]  ondansetron (ZOFRAN ODT) 4 MG disintegrating tablet Take 1 tablet (4 mg total) by mouth  every 8 (eight) hours as needed for nausea or vomiting. 03/13/20   Cook, Jayce G, DO  phentermine (ADIPEX-P) 37.5 MG tablet Take 1 tablet (37.5 mg total) by mouth daily before breakfast. 06/17/22   Shirley, Amanda M, FNP    Inpatient Medications: Scheduled Meds:  aspirin  81 mg Oral Daily   atorvastatin  40 mg Oral Daily   methimazole  10 mg Oral Daily   montelukast  10 mg Oral Daily   pantoprazole  40 mg Oral BID AC   Continuous Infusions:  heparin 1,250 Units/hr (09/01/22 0321)   lactated ringers 20 mL/hr at 08/31/22 2227   PRN Meds: acetaminophen, albuterol, fluticasone, naproxen, nitroGLYCERIN, ondansetron (ZOFRAN) IV, ondansetron  Allergies:    Allergies  Allergen Reactions   Azithromycin Swelling and Other (See Comments)   Clindamycin Swelling   Codeine Hives and Other (See Comments)    Nausea/dizzy   Nausea/dizzy   Meloxicam Other (See Comments)    Burns up from the inside    Neurontin [Gabapentin] Other (See Comments)    Makes her pass out and not remember what happened prior to taking med.   Penicillins Anaphylaxis    Patient allergic to all "cillins" Has patient had a PCN reaction causing immediate rash, facial/tongue/throat swelling, SOB or lightheadedness with hypotension: Yes Has patient had a PCN reaction causing severe rash involving mucus membranes or skin necrosis: Yes Has patient had a PCN reaction that required hospitalization: No Has patient had a PCN reaction occurring within the last 10 years: Yes If all of the above answers are "NO", then may proceed with Cephalosporin use.    Clindamycin/Lincomycin Swelling   Other Hives and Other (See Comments)    Berry flavored food and drinks   Diphenhydramine Hcl Rash and Other (See Comments)   Doxycycline Rash    Social History:   Social History   Socioeconomic History   Marital status: Single    Spouse name: Not on file   Number of children: Not on file   Years of education: Not on file   Highest  education level: Not on file  Occupational History   Not on file  Tobacco Use   Smoking status: Every Day    Packs/day: 0.50    Years: 18.00    Additional pack years: 0.00    Total pack years: 9.00    Types: Cigarettes   Smokeless tobacco: Never  Vaping Use   Vaping Use: Never used  Substance and Sexual Activity   Alcohol use: Yes    Alcohol/week: 0.0 standard drinks of alcohol    Comment: occasionally   Drug use: Not Currently    Types: Marijuana   Sexual activity: Yes    Partners: Male    Birth control/protection: Surgical    Comment: Hysterectomy  Other Topics Concern   Not on file  Social History Narrative   Not on file   Social Determinants of Health   Financial Resource Strain: Not on file  Food Insecurity: Not on file  Transportation Needs: Not on file  Physical Activity: Not on file  Stress: Not on file  Social Connections: Not on file  Intimate Partner Violence: Not on file    Family History:    Family History  Problem Relation Age of Onset   Hypertension Mother    Alcohol abuse Mother    Deep vein thrombosis Mother    Alcohol abuse Father    COPD Father    Emphysema Father    Heart disease Father      ROS:  Please see the history of present illness.   All other ROS reviewed and negative.     Physical Exam/Data:   Vitals:   09/01/22 0315 09/01/22 0500 09/01/22 0615 09/01/22 0700  BP:   99/67   Pulse: (!) 111 (!) 111 (!) 106 (!) 101  Resp: (!) 22 13 12 (!) 22  Temp:      TempSrc:      SpO2: 96% 96% 98% 94%  Weight:      Height:       No intake or output data in the 24 hours ending 09/01/22 0801    08/31/2022    4:25 PM 07/15/2022    1:41 PM 06/17/2022    1:03 PM  Last 3 Weights  Weight (lbs) 220 lb 230 lb 6.4 oz 227 lb 3.2 oz  Weight (kg) 99.791 kg 104.509 kg 103.057   kg     Body mass index is 34.46 kg/m.  General:  Well nourished, well developed, in no acute distress HEENT: normal Neck: no JVD Vascular: No carotid bruits; Distal  pulses 2+ bilaterally Cardiac:  normal S1, S2; RRR; no murmur  Lungs:  clear to auscultation bilaterally, no wheezing, rhonchi or rales  Abd: soft, nontender, no hepatomegaly  Ext: no edema Musculoskeletal:  No deformities, BUE and BLE strength normal and equal Skin: warm and dry  Neuro:  CNs 2-12 intact, no focal abnormalities noted Psych:  Normal affect   EKG:  The EKG was personally reviewed and demonstrates:  ST 118bpm, TWI aVL Telemetry:  Telemetry was personally reviewed and demonstrates: SR/ST 100-120s   Relevant CV Studies:  Echo ordered  Laboratory Data:  High Sensitivity Troponin:   Recent Labs  Lab 08/31/22 1633 08/31/22 1850 08/31/22 2200 09/01/22 0006  TROPONINIHS 317* 576* 1,031* 1,163*     Chemistry Recent Labs  Lab 08/31/22 1633 09/01/22 0202  NA 139 138  K 3.5 3.2*  CL 108 108  CO2 24 25  GLUCOSE 109* 98  BUN 7 7  CREATININE 0.62 0.55  CALCIUM 8.9 8.3*  MG  --  1.9  GFRNONAA >60 >60  ANIONGAP 7 5    Recent Labs  Lab 08/31/22 1633 09/01/22 0202  PROT 6.5 6.0*  ALBUMIN 3.5 3.4*  AST 38 40  ALT 36 34  ALKPHOS 71 64  BILITOT 0.4 0.5   Lipids  Recent Labs  Lab 08/31/22 2200  CHOL 135  TRIG 110  HDL 46  LDLCALC 67  CHOLHDL 2.9    Hematology Recent Labs  Lab 08/31/22 1633 09/01/22 0202  WBC 8.8 8.4  RBC 4.83 4.23  HGB 13.7 12.4  HCT 43.3 38.5  MCV 89.6 91.0  MCH 28.4 29.3  MCHC 31.6 32.2  RDW 12.8 12.8  PLT 287 237   Thyroid  Recent Labs  Lab 08/31/22 2200  TSH <0.010*  FREET4 1.74*    BNPNo results for input(s): "BNP", "PROBNP" in the last 168 hours.  DDimer No results for input(s): "DDIMER" in the last 168 hours.   Radiology/Studies:  US Abdomen Limited RUQ (LIVER/GB)  Result Date: 08/31/2022 CLINICAL DATA:  [id number] Vomiting [id number] EXAM: ULTRASOUND ABDOMEN LIMITED RIGHT UPPER QUADRANT COMPARISON:  CT angiography chest 08/31/2022 FINDINGS: Gallbladder: Multiple calcified gallstones within the gallbladder lumen.  Associated gallbladder wall thickening of 4 mm. No pericholecystic fluid. No sonographic Murphy sign noted by sonographer. Common bile duct: Diameter: 3 mm Liver: No focal lesion identified. Increased parenchymal echogenicity. Portal vein is patent on color Doppler imaging with normal direction of blood flow towards the liver. Other: None. IMPRESSION: 1. Cholelithiasis with nonspecific mild gallbladder wall thickening. Negative sonographic Murphy sign and no pericholecystic fluid. No definite findings of acute cholecystitis. 2. Hepatic steatosis. Please note limited evaluation for focal hepatic masses in a patient with hepatic steatosis due to decreased penetration of the acoustic ultrasound waves. Electronically Signed   By: Morgane  Naveau M.D.   On: 08/31/2022 21:34   CT Angio Chest PE W and/or Wo Contrast  Result Date: 08/31/2022 CLINICAL DATA:  Chest pain EXAM: CT ANGIOGRAPHY CHEST WITH CONTRAST TECHNIQUE: Multidetector CT imaging of the chest was performed using the standard protocol during bolus administration of intravenous contrast. Multiplanar CT image reconstructions and MIPs were obtained to evaluate the vascular anatomy. RADIATION DOSE REDUCTION: This exam was performed according to the departmental dose-optimization program which includes automated exposure control, adjustment of the mA and/or   kV according to patient size and/or use of iterative reconstruction technique. CONTRAST:  75mL OMNIPAQUE IOHEXOL 350 MG/ML SOLN COMPARISON:  None Available. FINDINGS: Cardiovascular: No filling defects in the pulmonary arteries to suggest pulmonary emboli. Heart is normal size. Aorta is normal caliber. Mediastinum/Nodes: No mediastinal, hilar, or axillary adenopathy. Trachea and esophagus are unremarkable. Thyroid unremarkable. Lungs/Pleura: Lungs are clear. No focal airspace opacities or effusions. 7 mm nodule in the right lower lobe at the right lung base posteriorly on image 118 of series 5. Upper Abdomen:  Multiple gallstones within the gallbladder. No acute findings. Musculoskeletal: Chest wall soft tissues are unremarkable. No acute bony abnormality. Review of the MIP images confirms the above findings. IMPRESSION: No evidence of pulmonary embolus. 7 mm right lower lobe pulmonary nodule. Non-contrast chest CT at 6-12 months is recommended. If the nodule is stable at time of repeat CT, then future CT at 18-24 months (from today's scan) is considered optional for low-risk patients, but is recommended for high-risk patients. This recommendation follows the consensus statement: Guidelines for Management of Incidental Pulmonary Nodules Detected on CT Images: From the Fleischner Society 2017; Radiology 2017; 284:228-243. Cholelithiasis. Electronically Signed   By: Kevin  Dover M.D.   On: 08/31/2022 19:17   DG Chest 2 View  Result Date: 08/31/2022 CLINICAL DATA:  Chest pain x2 weeks with vomiting. EXAM: CHEST - 2 VIEW COMPARISON:  September 18, 2018 FINDINGS: The heart size and mediastinal contours are within normal limits. Low lung volumes are noted. Both lungs are clear. The visualized skeletal structures are unremarkable. IMPRESSION: No active cardiopulmonary disease. Electronically Signed   By: Thaddeus  Houston M.D.   On: 08/31/2022 17:04     Assessment and Plan:   NSTEMI - presented with progressive chest pain with associated N/V for the last few weeks - HS trop elevated >1000, now down trending. EKG with nonspecific changes - continue IV heparin - she is NPO - reports chest pain 7/10 - start ASA 81mg daily. Continue Lipitor. Caution with BB and cocaine use, may need to change to Coreg - check an echo - plan for LHC today Risks and benefits of cardiac catheterization have been discussed with the patient.  These include bleeding, infection, kidney damage, stroke, heart attack, death.  The patient understands these risks and is willing to proceed.   RUQ pain - RUQ US showed gallstones with no definitive  acute cholecystitis - per IM  Cocaine use - UDS + cocaine - patient reports cocaine use about a month ago  Factor 5 Leiden - reports recent diagnosis  HLD - continue Lipitor 40mg daily  For questions or updates, please contact [facility] Please consult [email or web address] for contact info under    Signed, Ayo Guarino H Ferdinando Lodge, PA-C  09/01/2022 8:01 AM  

## 2022-09-01 NOTE — Progress Notes (Signed)
Pre-CABG testing has been completed. Preliminary results can be found in CV Proc through chart review.   09/01/22 5:38 PM Olen Cordial RVT

## 2022-09-01 NOTE — ED Notes (Signed)
Report given to cath lab. 

## 2022-09-01 NOTE — Consult Note (Signed)
ANTICOAGULATION CONSULT NOTE  Pharmacy Consult for heparin Indication: chest pain/ACS  Allergies  Allergen Reactions   Azithromycin Swelling and Other (See Comments)   Clindamycin Swelling   Codeine Hives and Other (See Comments)    Nausea/dizzy Nausea/dizzy   Meloxicam Other (See Comments)    Burns up from the inside    Neurontin [Gabapentin] Other (See Comments)    Makes her pass out and not remember what happened prior to taking med.   Penicillins Anaphylaxis    Patient allergic to all "cillins" Has patient had a PCN reaction causing immediate rash, facial/tongue/throat swelling, SOB or lightheadedness with hypotension: Yes Has patient had a PCN reaction causing severe rash involving mucus membranes or skin necrosis: Yes Has patient had a PCN reaction that required hospitalization: No Has patient had a PCN reaction occurring within the last 10 years: Yes If all of the above answers are "NO", then may proceed with Cephalosporin use.    Clindamycin/Lincomycin Swelling   Other Hives and Other (See Comments)    Berry flavored food and drinks   Diphenhydramine Hcl Rash and Other (See Comments)   Doxycycline Rash    Patient Measurements: Height: 5\' 7"  (170.2 cm) Weight: 99.8 kg (220 lb) IBW/kg (Calculated) : 61.6 Heparin Dosing Weight: 83.8  Vital Signs: Temp: 98.2 F (36.8 C) (05/29 0917) Temp Source: Oral (05/29 0917) BP: 124/91 (05/29 0917) Pulse Rate: 92 (05/29 1000)  Labs: Recent Labs    08/31/22 1633 08/31/22 1850 08/31/22 2200 09/01/22 0006 09/01/22 0202 09/01/22 0855 09/01/22 0857 09/01/22 1007  HGB 13.7  --   --   --  12.4  --   --   --   HCT 43.3  --   --   --  38.5  --   --   --   PLT 287  --   --   --  237  --   --   --   APTT  --  27  --   --   --   --   --   --   LABPROT  --  13.2  --   --   --   --   --   --   INR  --  1.0  --   --   --   --   --   --   HEPARINUNFRC  --   --   --   --  <0.10*  --  <0.10*  --   CREATININE 0.62  --   --   --   0.55  --   --   --   TROPONINIHS 317* 576*   < > 1,163*  --  765*  --  687*   < > = values in this interval not displayed.     Estimated Creatinine Clearance: 114.6 mL/min (by C-G formula based on SCr of 0.55 mg/dL).   Medical History: Past Medical History:  Diagnosis Date   Aneurysm (HCC) 01/04/2014   Arthritis    Asthma    WELL CONTROLLED   Brain aneurysm 2007   NEUROLOGY NOTE DOES NOT MENTION ANEURYSM BUT PT STATES SHE DID NOT HAVE TO HAVE SURGERY   Complication of anesthesia    FOR 1 C-SECTION PT WAS ITCHING AND VERY RED ON HER FACE   Cough, persistent 01/27/2016   Dermatitis due to sunburn 11/08/2013   Family history of adverse reaction to anesthesia    brother, neice and nephew got red in face with hives  Gallstones    GERD (gastroesophageal reflux disease)    Gonorrhea 06/20/2020   Headache    CHRONIC HEADACHES   History of chronic cough    DRY   Loss of memory 05/30/2017   Perforation of left tympanic membrane 05/17/2013   Pneumonia    PTSD (post-traumatic stress disorder)    PTSD (post-traumatic stress disorder)    Thyroid condition    PT WAS JUST TOLD ON 08-25-17 THAT SHE HAS A THYROID PROBLEM AND IS GOING TO F/U WITH ENDOCRINOLOGIST IN 2 WEEKS   Trichomonas vaginalis (TV) infection 06/20/2020    Medications:  No chronic anticoagulation PTA from chart review  Assessment: 37 y.o. female with PMH MDD/GAD, GERD, migraines, bipolar disorder who presents with chest pain and emesis. Troponins trending up, from 317 >> 576. ECG shows sinus tachycardia and currently in process of interpretation. Pharmacy has been consulted to initiate heparin for chest pain/ACS. Patient not on chronic anticoagulation per chart review.  Goal of Therapy:  Heparin level 0.3-0.7 units/ml Monitor platelets by anticoagulation protocol: Yes  Baseline Labs: aPTT - ordered; INR - ordered; Hgb - 13.7; PLT - 287  Date Time aPTT/HL Rate/Comment 05/29 0202 HL  <0.10 subtherapeutic  05/29 0857 HL < 0.10 Subtherapeutic @ 1250 un/hr    Plan:  Give 2500 units bolus x1;  Increase heparin infusion to 1600 units/hr Recheck HL in 6 hr after rate change Continue to monitor H&H and platelets daily while on heparin gtt.  Carisma Troupe Rodriguez-Guzman PharmD, BCPS 09/01/2022 11:10 AM

## 2022-09-01 NOTE — H&P (View-Only) (Signed)
[address]       [name] [address]             [phone]      Cardiothoracic Surgery Consultation  Reason for Consult: Severe single vessel coronary artery disease s/p NSTEMI Referring Physician: Dr. Thayer Ohm End  Elizabeth Spencer is an 37 y.o. female.  HPI:   The patient is a 37 year old woman with a history of bipolar disorder, tobacco abuse, recent cocaine use, prediabetes, hyperlipidemia, and GERD who reports developing substernal chest discomfort radiating down her left arm on Mother's Day.  Over the last couple weeks she has had recurrent episodes of chest discomfort that have been accelerating.  She presented to [facility] after developing severe chest pain that would not resolve.  She was found to have elevated troponin I of 317 which increased to 1163 yesterday morning therefore decreasing to 765 this morning.  Cardiac catheterization this morning showed a hazy and eccentric 90% ostial LAD stenosis concerning for acute plaque rupture.  Left circumflex had no significant disease.  The proximal RCA had about 40% proximal stenosis.  Left ventricular ejection fraction was about 30 to 35% with anterior wall hypokinesis/akinesis.  LVEDP was increased to 25.  The patient was started on nitroglycerin drip which has been titrated and she has remained pain-free.  Intra-aortic balloon pump was deferred due to her inability to lie flat and remains still.  She was transferred to [facility] late this afternoon for coronary bypass surgery tomorrow.  She is currently pain-free on nitroglycerin at 5.  Past Medical History:  Diagnosis Date   Aneurysm (HCC) 01/04/2014   Arthritis    Asthma    WELL CONTROLLED   Brain aneurysm 2007   NEUROLOGY NOTE DOES NOT MENTION ANEURYSM BUT PT STATES SHE DID NOT HAVE TO HAVE SURGERY   Complication of anesthesia    FOR 1 C-SECTION PT WAS ITCHING AND VERY RED ON HER FACE   Cough, persistent 01/27/2016   Dermatitis due to sunburn 11/08/2013    Family history of adverse reaction to anesthesia    brother, neice and nephew got red in face with hives   Gallstones    GERD (gastroesophageal reflux disease)    Gonorrhea 06/20/2020   Headache    CHRONIC HEADACHES   History of chronic cough    DRY   Loss of memory 05/30/2017   Perforation of left tympanic membrane 05/17/2013   Pneumonia    PTSD (post-traumatic stress disorder)    PTSD (post-traumatic stress disorder)    Thyroid condition    PT WAS JUST TOLD ON 08-25-17 THAT SHE HAS A THYROID PROBLEM AND IS GOING TO F/U WITH ENDOCRINOLOGIST IN 2 WEEKS   Trichomonas vaginalis (TV) infection 06/20/2020    Past Surgical History:  Procedure Laterality Date   ABDOMINAL HYSTERECTOMY     CESAREAN SECTION     x3   CYSTOSCOPY N/A 09/29/2017   Procedure: CYSTOSCOPY;  Surgeon: Vena Austria, MD;  Location: [facility] ORS;  Service: Gynecology;  Laterality: N/A;   CYSTOSCOPY N/A 11/05/2017   Procedure: CYSTOSCOPY;  Surgeon: Vena Austria, MD;  Location: [facility] ORS;  Service: Gynecology;  Laterality: N/A;   HYSTEROSCOPY WITH D & C N/A 09/01/2017   Procedure: DILATATION AND CURETTAGE /HYSTEROSCOPY;  Surgeon: Vena Austria, MD;  Location: [facility];  Service: Gynecology;  Laterality: N/A;   LAPAROSCOPY N/A 09/01/2017   Procedure: LAPAROSCOPY OPERATIVE with biopsy;  Surgeon: Vena Austria, MD;  Location: [facility];  Service: Gynecology;  Laterality: N/A;   LAPAROSCOPY N/A 11/05/2017   Procedure: LAPAROSCOPY DIAGNOSTIC;  Surgeon: Vena Austria, MD;  Location: [facility];  Service: Gynecology;  Laterality: N/A;   REPAIR VAGINAL CUFF N/A 11/05/2017   Procedure: REPAIR VAGINAL CUFF;  Surgeon: Vena Austria, MD;  Location: [facility];  Service: Gynecology;  Laterality: N/A;   TOTAL LAPAROSCOPIC HYSTERECTOMY WITH SALPINGECTOMY Bilateral 09/29/2017   Procedure: TOTAL LAPAROSCOPIC HYSTERECTOMY WITH SALPINGECTOMY;  Surgeon: Vena Austria, MD;  Location: [facility] OR[facility];  Service: Gynecology;  Laterality:  Bilateral;    Family History  Problem Relation Age of Onset   Hypertension Mother    Alcohol abuse Mother    Deep vein thrombosis Mother    Alcohol abuse Father    COPD Father    Emphysema Father    Heart disease Father     Social History:  reports that she has been smoking cigarettes. She has a 9.00 pack-year smoking history. She has never used smokeless tobacco. She reports current alcohol use. She reports that she does not currently use drugs after having used the following drugs: Marijuana and Cocaine.  Allergies:  Allergies  Allergen Reactions   Azithromycin Swelling and Other (See Comments)   Clindamycin Swelling   Codeine Hives and Other (See Comments)    Nausea/dizzy Nausea/dizzy   Meloxicam Other (See Comments)    Burns up from the inside    Neurontin [Gabapentin] Other (See Comments)    Makes her pass out and not remember what happened prior to taking med.   Penicillins Anaphylaxis    Patient allergic to all "cillins" Has patient had a PCN reaction causing immediate rash, facial/tongue/throat swelling, SOB or lightheadedness with hypotension: Yes Has patient had a PCN reaction causing severe rash involving mucus membranes or skin necrosis: Yes Has patient had a PCN reaction that required hospitalization: No Has patient had a PCN reaction occurring within the last 10 years: Yes If all of the above answers are "NO", then may proceed with Cephalosporin use.    Clindamycin/Lincomycin Swelling   Other Hives and Other (See Comments)    Berry flavored food and drinks   Diphenhydramine Hcl Rash and Other (See Comments)   Doxycycline Rash   Egg-Derived Products Rash    Medications: I have reviewed the patient's current medications. Prior to Admission:  Medications Prior to Admission  Medication Sig Dispense Refill Last Dose   albuterol (VENTOLIN HFA) 108 (90 Base) MCG/ACT inhaler Inhale 2 puffs into the lungs every 6 (six) hours as needed for wheezing or shortness of  breath. 1 each 5    [START ON 09/02/2022] aspirin 81 MG chewable tablet Chew 1 tablet (81 mg total) by mouth daily.      [START ON 09/02/2022] atorvastatin (LIPITOR) 40 MG tablet Take 1 tablet (40 mg total) by mouth daily.      cetirizine (ZYRTEC) 10 MG tablet Take 1 tablet (10 mg total) by mouth daily. 90 tablet 1    EPIPEN 2-PAK 0.3 MG/0.3ML SOAJ injection INJECT INTO THIGH MUSCLE THROUGH CLOTHES AS NEEDED SEVERE ALLERGIC REACTION 1 each 1    fluticasone (FLONASE) 50 MCG/ACT nasal spray 1 spray by Each Nare route daily. 16 g 6    methimazole (TAPAZOLE) 5 MG tablet Take by mouth.      metoprolol tartrate (LOPRESSOR) 25 MG tablet Take 1 tablet (25 mg total) by mouth 2 (two) times daily.      montelukast (SINGULAIR) 10 MG tablet Take 1 tablet (10 mg total) by mouth daily. 90 tablet 1  Multiple Vitamins-Minerals (CENTRUM ADULTS PO) Take 1 tablet by mouth at bedtime.       nitroGLYCERIN (NITROSTAT) 0.4 MG SL tablet Place 1 tablet (0.4 mg total) under the tongue every 5 (five) minutes as needed for chest pain.  12    omeprazole (PRILOSEC) 20 MG capsule Take 1 capsule (20 mg total) by mouth 2 (two) times daily before a meal. 180 capsule 1    phentermine (ADIPEX-P) 37.5 MG tablet Take 1 tablet (37.5 mg total) by mouth daily before breakfast. 30 tablet 0    Scheduled:  [START ON 09/02/2022] aspirin EC  81 mg Oral Daily   [START ON 09/02/2022] atorvastatin  40 mg Oral Daily   bisacodyl  5 mg Oral Once   [START ON 09/02/2022] chlorhexidine  15 mL Mouth/Throat Once   Chlorhexidine Gluconate Cloth  6 each Topical Daily   Chlorhexidine Gluconate Cloth  6 each Topical Once   And   Chlorhexidine Gluconate Cloth  6 each Topical Once   [START ON 09/02/2022] diazepam  5 mg Oral Once   [START ON 09/02/2022] methimazole  10 mg Oral Daily   [START ON 09/02/2022] metoprolol tartrate  12.5 mg Oral Once   metoprolol tartrate  25 mg Oral BID   Continuous:  heparin 1,000 Units/hr (09/01/22 1841)   nitroGLYCERIN 5  mcg/min (09/01/22 1841)   BMW:[id number], nitroGLYCERIN, ondansetron (ZOFRAN) IV, temazepam Anti-infectives (From admission, onward)    None       Results for orders placed or performed during the hospital encounter of 09/01/22 (from the past 48 hour(s))  Surgical pcr screen     Status: None   Collection Time: 09/01/22  5:04 PM   Specimen: Nasal Mucosa; Nasal Swab  Result Value Ref Range   MRSA, PCR NEGATIVE NEGATIVE   Staphylococcus aureus NEGATIVE NEGATIVE    Comment: (NOTE) The Xpert SA Assay (FDA approved for NASAL specimens in patients 49 years of age and older), is one component of a comprehensive surveillance program. It is not intended to diagnose infection nor to guide or monitor treatment. Performed at [facility], [address], [place], [place] [address]   Type and screen [facility]     Status: None   Collection Time: 09/01/22  6:23 PM  Result Value Ref Range   ABO/RH(D) A NEG    Antibody Screen NEG    Sample Expiration      09/04/2022,2359 Performed at [facility], [address], [place], [place] [address]     VAS US DOPPLER PRE CABG  Result Date: 09/01/2022 PREOPERATIVE VASCULAR EVALUATION Patient Name:  Elizabeth Spencer  Date of Exam:   09/01/2022 Medical Rec #: [id number]      Accession #:    [id number] Date of Birth: May 22, 1985       Patient Gender: F Patient Age:   9 years Exam Location:  [facility] Procedure:      VAS US DOPPLER PRE CABG Referring Phys: Evelene Croon --------------------------------------------------------------------------------  Indications:      Pre-CABG. Risk Factors:     None. Limitations:      Left arm pain, S/P heart cath Comparison Study: No prior studies. Performing Technologist: Olen Cordial RVT  Examination Guidelines: A complete evaluation includes B-mode imaging, spectral Doppler, color Doppler, and power Doppler as needed of all accessible portions of each vessel. Bilateral testing is  considered an integral part of a complete examination. Limited examinations for reoccurring indications may be performed as noted.  Right  Carotid Findings: +----------+--------+--------+--------+-----------------------+--------+           PSV cm/sEDV cm/sStenosisDescribe               Comments +----------+--------+--------+--------+-----------------------+--------+ CCA Prox  85      14              smooth and heterogenous         +----------+--------+--------+--------+-----------------------+--------+ CCA Distal84      13              smooth and heterogenous         +----------+--------+--------+--------+-----------------------+--------+ ICA Prox  85      19              smooth and heterogenous         +----------+--------+--------+--------+-----------------------+--------+ ICA Mid   47      21              smooth and heterogenous         +----------+--------+--------+--------+-----------------------+--------+ ICA Distal57      27                                              +----------+--------+--------+--------+-----------------------+--------+ ECA       84      13                                              +----------+--------+--------+--------+-----------------------+--------+ +----------+--------+-------+--------+------------+           PSV cm/sEDV cmsDescribeArm Pressure +----------+--------+-------+--------+------------+ Subclavian115                                 +----------+--------+-------+--------+------------+ +---------+--------+--+--------+--+---------+ VertebralPSV cm/s37EDV cm/s13Antegrade +---------+--------+--+--------+--+---------+ Left Carotid Findings: +----------+--------+--------+--------+-----------------------+--------+           PSV cm/sEDV cm/sStenosisDescribe               Comments +----------+--------+--------+--------+-----------------------+--------+ CCA Prox  106     27                                               +----------+--------+--------+--------+-----------------------+--------+ CCA Distal100     24              smooth and heterogenous         +----------+--------+--------+--------+-----------------------+--------+ ICA Prox  54      20              smooth and heterogenous         +----------+--------+--------+--------+-----------------------+--------+ ICA Mid   67      23                                              +----------+--------+--------+--------+-----------------------+--------+ ICA Distal59      27                                              +----------+--------+--------+--------+-----------------------+--------+  ECA       77      21                                              +----------+--------+--------+--------+-----------------------+--------+  +----------+--------+--------+--------+------------+ SubclavianPSV cm/sEDV cm/sDescribeArm Pressure +----------+--------+--------+--------+------------+           119                                  +----------+--------+--------+--------+------------+ +---------+--------+--+--------+-+---------+ VertebralPSV cm/s27EDV cm/s8Antegrade +---------+--------+--+--------+-+---------+  ABI Findings: +--------+------------------+-----+---------+--------+ Right   Rt Pressure (mmHg)IndexWaveform Comment  +--------+------------------+-----+---------+--------+ [id number]                    triphasic         +--------+------------------+-----+---------+--------+ PTA     118               1.15 triphasic         +--------+------------------+-----+---------+--------+ DP      115               1.12 triphasic         +--------+------------------+-----+---------+--------+ +--------+------------------+-----+---------+--------------+ Left    Lt Pressure (mmHg)IndexWaveform Comment        +--------+------------------+-----+---------+--------------+ Brachial                                 Restricted arm +--------+------------------+-----+---------+--------------+ PTA     113               1.10 triphasic               +--------+------------------+-----+---------+--------------+ DP      100               0.97 triphasic               +--------+------------------+-----+---------+--------------+ +-------+---------------+----------------+ ABI/TBIToday's ABI/TBIPrevious ABI/TBI +-------+---------------+----------------+ Right  1.15                            +-------+---------------+----------------+ Left   1.1                             +-------+---------------+----------------+  Right Doppler Findings: +--------+--------+-----+---------+--------+ Site    PressureIndexDoppler  Comments +--------+--------+-----+---------+--------+ [id number]          triphasic         +--------+--------+-----+---------+--------+ Radial               triphasic         +--------+--------+-----+---------+--------+ Ulnar                triphasic         +--------+--------+-----+---------+--------+  Left Doppler Findings: +--------+--------+-----+-------+--------------+ Site    PressureIndexDopplerComments       +--------+--------+-----+-------+--------------+ Brachial                    Restricted arm +--------+--------+-----+-------+--------------+   Summary: Right Carotid: Velocities in the right ICA are consistent with a 1-39% stenosis. Left Carotid: Velocities in the left ICA are consistent with a 1-39% stenosis. Vertebrals: Bilateral vertebral arteries demonstrate antegrade flow. Right ABI: Resting right ankle-brachial index is within normal range. Left ABI: Resting left ankle-brachial index is within normal range.  Right Upper Extremity: Doppler waveforms remain within normal limits with right radial compression. Doppler waveforms remain within normal limits with right ulnar compression.     Preliminary    ECHOCARDIOGRAM COMPLETE  Result Date:  09/01/2022    ECHOCARDIOGRAM REPORT   Patient Name:   Elizabeth Spencer Date of Exam: 09/01/2022 Medical Rec #:  [id number]     Height:       67.0 in Accession #:    [id number]    Weight:       220.0 lb Date of Birth:  1985/08/22      BSA:          2.106 m Patient Age:    37 years      BP:           99/68 mmHg Patient Gender: F             HR:           109 bpm. Exam Location:  [facility] Procedure: 2D Echo, Cardiac Doppler and Color Doppler Indications:     Chest pain R07.9  History:         Patient has no prior history of Echocardiogram examinations.  Sonographer:     Cristela Blue Referring Phys:  [id number] CADENCE H FURTH Diagnosing Phys: Debbe Odea MD IMPRESSIONS  1. Left ventricular ejection fraction, by estimation, is 30 to 35%. The left ventricle has moderate to severely decreased function. The left ventricle demonstrates global hypokinesis. There is mild left ventricular hypertrophy. Left ventricular diastolic parameters are consistent with Grade I diastolic dysfunction (impaired relaxation).  2. Right ventricular systolic function is normal. The right ventricular size is not well visualized.  3. The mitral valve is normal in structure. Mild mitral valve regurgitation.  4. The aortic valve was not well visualized. Aortic valve regurgitation is not visualized. FINDINGS  Left Ventricle: Left ventricular ejection fraction, by estimation, is 30 to 35%. The left ventricle has moderate to severely decreased function. The left ventricle demonstrates global hypokinesis. The left ventricular internal cavity size was normal in size. There is mild left ventricular hypertrophy. Left ventricular diastolic parameters are consistent with Grade I diastolic dysfunction (impaired relaxation). Right Ventricle: The right ventricular size is not well visualized. No increase in right ventricular wall thickness. Right ventricular systolic function is normal. Left Atrium: Left atrial size was normal in size. Right Atrium: Right atrial size  was normal in size. Pericardium: There is no evidence of pericardial effusion. Mitral Valve: The mitral valve is normal in structure. Mild mitral valve regurgitation. Tricuspid Valve: The tricuspid valve is not well visualized. Tricuspid valve regurgitation is not demonstrated. Aortic Valve: The aortic valve was not well visualized. Aortic valve regurgitation is not visualized. Aortic valve mean gradient measures 3.0 mmHg. Aortic valve peak gradient measures 6.0 mmHg. Aortic valve area, by VTI measures 3.36 cm. Pulmonic Valve: The pulmonic valve was not well visualized. Pulmonic valve regurgitation is not visualized. Aorta: The aortic root is normal in size and structure. IAS/Shunts: No atrial level shunt detected by color flow Doppler.  LEFT VENTRICLE PLAX 2D LVIDd:         5.40 cm      Diastology LVIDs:         4.50 cm      LV e' medial:   6.09 cm/s LV PW:         1.10 cm      LV E/e' medial: 10.4 LV IVS:        1.20 cm  LVOT diam:     2.10 cm LV SV:         55 LV SV Index:   26 LVOT Area:     3.46 cm  LV Volumes (MOD) LV vol d, MOD A2C: 134.0 ml LV vol d, MOD A4C: 151.0 ml LV vol s, MOD A2C: 93.4 ml LV vol s, MOD A4C: 114.0 ml LV SV MOD A2C:     40.6 ml LV SV MOD A4C:     151.0 ml LV SV MOD BP:      38.2 ml RIGHT VENTRICLE RV Basal diam:  2.10 cm RV Mid diam:    1.60 cm RV S prime:     11.70 cm/s TAPSE (M-mode): 2.0 cm LEFT ATRIUM             Index        RIGHT ATRIUM          Index LA diam:        4.00 cm 1.90 cm/m   RA Area:     5.70 cm LA Vol (A2C):   53.0 ml 25.17 ml/m  RA Volume:   6.91 ml  3.28 ml/m LA Vol (A4C):   29.9 ml 14.20 ml/m LA Biplane Vol: 39.7 ml 18.85 ml/m  AORTIC VALVE AV Area (Vmax):    2.45 cm AV Area (Vmean):   2.63 cm AV Area (VTI):     3.36 cm AV Vmax:           122.00 cm/s AV Vmean:          80.000 cm/s AV VTI:            0.164 m AV Peak Grad:      6.0 mmHg AV Mean Grad:      3.0 mmHg LVOT Vmax:         86.20 cm/s LVOT Vmean:        60.800 cm/s LVOT VTI:          0.159 m LVOT/AV  VTI ratio: 0.97  AORTA Ao Root diam: 3.20 cm MITRAL VALVE               TRICUSPID VALVE MV Area (PHT): 6.83 cm    TR Peak grad:   11.6 mmHg MV Decel Time: 111 msec    TR Vmax:        170.00 cm/s MV E velocity: 63.40 cm/s MV A velocity: 96.90 cm/s  SHUNTS MV E/A ratio:  0.65        Systemic VTI:  0.16 m                            Systemic Diam: 2.10 cm Debbe Odea MD Electronically signed by Debbe Odea MD Signature Date/Time: 09/01/2022/4:32:46 PM    Final    CARDIAC CATHETERIZATION  Result Date: 09/01/2022 Conclusions: Severe single-vessel coronary artery disease, with hazy and eccentric 90% ostial LAD stenosis concerning for acute plaque rupture.  Tubular 40% proximal RCA stenosis is also present.  Large LCx without significant disease. Moderately to severely reduced left ventricular systolic function (LVEF 30-35%) with anterior wall hypokinesis/akinesis.  LVEF 30-35%. Moderately elevated left ventricular filling pressure (LVEDP 25 mmHg). Recommendations: Critical ostial LAD disease not well-suited for PCI given risk for compromise of LCx and D1.  Images reviewed with interventional cardiology and cardiac surgery teams at [facility]; we will arrange for transfer to [facility] with plans for CABG tomorrow. Restart heparin infusion 2 hours  after TR band removal. Titrate NTG infusion for relief of chest pain.  IABP deferred due to patient's inability to lie flat and remain still. Aggressive secondary prevention of coronary artery disease. Obtain echocardiogram. Yvonne Kendall, MD [facility]  US Abdomen Limited RUQ (LIVER/GB)  Result Date: 08/31/2022 CLINICAL DATA:  [id number] Vomiting [id number] EXAM: ULTRASOUND ABDOMEN LIMITED RIGHT UPPER QUADRANT COMPARISON:  CT angiography chest 08/31/2022 FINDINGS: Gallbladder: Multiple calcified gallstones within the gallbladder lumen. Associated gallbladder wall thickening of 4 mm. No pericholecystic fluid. No sonographic Murphy sign noted by sonographer.  Common bile duct: Diameter: 3 mm Liver: No focal lesion identified. Increased parenchymal echogenicity. Portal vein is patent on color Doppler imaging with normal direction of blood flow towards the liver. Other: None. IMPRESSION: 1. Cholelithiasis with nonspecific mild gallbladder wall thickening. Negative sonographic Murphy sign and no pericholecystic fluid. No definite findings of acute cholecystitis. 2. Hepatic steatosis. Please note limited evaluation for focal hepatic masses in a patient with hepatic steatosis due to decreased penetration of the acoustic ultrasound waves. Electronically Signed   By: Tish Frederickson M.D.   On: 08/31/2022 21:34   CT Angio Chest PE W and/or Wo Contrast  Result Date: 08/31/2022 CLINICAL DATA:  Chest pain EXAM: CT ANGIOGRAPHY CHEST WITH CONTRAST TECHNIQUE: Multidetector CT imaging of the chest was performed using the standard protocol during bolus administration of intravenous contrast. Multiplanar CT image reconstructions and MIPs were obtained to evaluate the vascular anatomy. RADIATION DOSE REDUCTION: This exam was performed according to the departmental dose-optimization program which includes automated exposure control, adjustment of the mA and/or kV according to patient size and/or use of iterative reconstruction technique. CONTRAST:  75mL OMNIPAQUE IOHEXOL 350 MG/ML SOLN COMPARISON:  None Available. FINDINGS: Cardiovascular: No filling defects in the pulmonary arteries to suggest pulmonary emboli. Heart is normal size. Aorta is normal caliber. Mediastinum/Nodes: No mediastinal, hilar, or axillary adenopathy. Trachea and esophagus are unremarkable. Thyroid unremarkable. Lungs/Pleura: Lungs are clear. No focal airspace opacities or effusions. 7 mm nodule in the right lower lobe at the right lung base posteriorly on image 118 of series 5. Upper Abdomen: Multiple gallstones within the gallbladder. No acute findings. Musculoskeletal: Chest wall soft tissues are unremarkable.  No acute bony abnormality. Review of the MIP images confirms the above findings. IMPRESSION: No evidence of pulmonary embolus. 7 mm right lower lobe pulmonary nodule. Non-contrast chest CT at 6-12 months is recommended. If the nodule is stable at time of repeat CT, then future CT at 18-24 months (from today's scan) is considered optional for low-risk patients, but is recommended for high-risk patients. This recommendation follows the consensus statement: Guidelines for Management of Incidental Pulmonary Nodules Detected on CT Images: From the Fleischner Society 2017; Radiology 2017; 284:228-243. Cholelithiasis. Electronically Signed   By: Charlett Nose M.D.   On: 08/31/2022 19:17   DG Chest 2 View  Result Date: 08/31/2022 CLINICAL DATA:  Chest pain x2 weeks with vomiting. EXAM: CHEST - 2 VIEW COMPARISON:  September 18, 2018 FINDINGS: The heart size and mediastinal contours are within normal limits. Low lung volumes are noted. Both lungs are clear. The visualized skeletal structures are unremarkable. IMPRESSION: No active cardiopulmonary disease. Electronically Signed   By: Aram Candela M.D.   On: 08/31/2022 17:04    Review of Systems  Constitutional:  Positive for activity change, diaphoresis and fatigue.  HENT: Negative.    Eyes: Negative.   Respiratory:  Positive for shortness of breath.   Cardiovascular:  Positive for chest pain. Negative for palpitations  and leg swelling.  Gastrointestinal:  Positive for nausea and vomiting.  Endocrine: Negative.   Genitourinary: Negative.   Musculoskeletal: Negative.   Skin: Negative.   Allergic/Immunologic: Negative.   Neurological:  Negative for dizziness and syncope.  Hematological: Negative.   Psychiatric/Behavioral:         Bipolar disorder   Blood pressure (!) 86/64, pulse (!) 114, resp. rate 13, height 5\' 7"  (1.702 m), weight 88 kg, last menstrual period 08/22/2017, SpO2 98 %. Physical Exam Constitutional:      Appearance: Normal appearance. She  is obese.  HENT:     Head: Normocephalic and atraumatic.     Mouth/Throat:     Mouth: Mucous membranes are moist.     Pharynx: Oropharynx is clear.     Comments: Poor dentition Eyes:     Extraocular Movements: Extraocular movements intact.     Conjunctiva/sclera: Conjunctivae normal.     Pupils: Pupils are equal, round, and reactive to light.  Neck:     Vascular: No carotid bruit.  Cardiovascular:     Rate and Rhythm: Normal rate and regular rhythm.     Pulses: Normal pulses.     Heart sounds: Normal heart sounds. No murmur heard. Pulmonary:     Effort: Pulmonary effort is normal.     Breath sounds: Normal breath sounds.  Abdominal:     General: Bowel sounds are normal. There is no distension.     Tenderness: There is no abdominal tenderness.  Musculoskeletal:        General: No swelling.  Skin:    General: Skin is warm and dry.  Neurological:     General: No focal deficit present.     Mental Status: She is alert and oriented to person, place, and time.  Psychiatric:        Mood and Affect: Mood normal.        Behavior: Behavior normal.   Physicians  Panel Physicians Referring Physician Case Authorizing Physician  End, Cristal Deer, MD (Primary)  Furth, Cadence H, PA-C   Procedures  LEFT HEART CATH AND CORONARY ANGIOGRAPHY   Conclusion  Conclusions: Severe single-vessel coronary artery disease, with hazy and eccentric 90% ostial LAD stenosis concerning for acute plaque rupture.  Tubular 40% proximal RCA stenosis is also present.  Large LCx without significant disease. Moderately to severely reduced left ventricular systolic function (LVEF 30-35%) with anterior wall hypokinesis/akinesis.  LVEF 30-35%. Moderately elevated left ventricular filling pressure (LVEDP 25 mmHg).   Recommendations: Critical ostial LAD disease not well-suited for PCI given risk for compromise of LCx and D1.  Images reviewed with interventional cardiology and cardiac surgery teams at [facility];  we will arrange for transfer to [facility] with plans for CABG tomorrow. Restart heparin infusion 2 hours after TR band removal. Titrate NTG infusion for relief of chest pain.  IABP deferred due to patient's inability to lie flat and remain still. Aggressive secondary prevention of coronary artery disease. Obtain echocardiogram.   Yvonne Kendall, MD [facility]     Recommendations  Antiplatelet/Anticoag Continue aspirin 81 mg daily.  Restart heparin infusion 2 hours after TR band removal.  Defer addition of P2Y12 pending CABG.  Discharge Date Anticipated discharge date to be determined.   Indications  Non-ST elevation (NSTEMI) myocardial infarction (HCC) [I21.4 (ICD-10-CM)]   Clinical Presentation  CHF/Shock Congestive heart failure not present. No shock present.   Procedural Details  Technical Details Indication: 37 y.o. year-old bipolar affective disorder, GERD, migraines, chronic low back pain, prediabetes, hyperlipidemia,  and polysubstance abuse, presenting with progressive chest pain and shortness of breath x 3 weeks and significantly worse over the last 3 days.  She was found to have elevated HS-TnI, consistent with NSTEMI.  GFR: >60 ml/min  Procedure: The risks, benefits, complications, treatment options, and expected outcomes were discussed with the patient. The patient and/or family concurred with the proposed plan, giving informed consent. The left wrist was prepped and draped in a sterile fashion. 1% lidocaine was used for local anesthesia. Ultrasound was used to evaluate the left radial artery. It was small but patent (measuring 1.8-2.0 mm in diameter).  A micropuncture needle was used to access the left radial artery under ultrasound guidance. Using the modified Seldinger access technique, a 424F slender Glidesheath was placed in the left radial artery. 3 mg Verapamil was given through the sheath. Heparin 5,000 units were administered.  Selective coronary angiography was  performed using a 424F TIG4.0 catheter to engage the left coronary artery and a 424F JR4 catheter to engage the right coronary artery. Left heart catheterization was performed using a 424F JR4 catheter. Left ventriculogram was performed with a power injection of contrast.  At the end of the procedure, the radial artery sheath was removed and a TR band applied to achieve patent hemostasis. There were no immediate complications. The patient was taken to the recovery area in stable condition.   Estimated blood loss <50 mL.   During this procedure medications were administered to achieve and maintain moderate conscious sedation while the patient's heart rate, blood pressure, and oxygen saturation were continuously monitored and I was present face-to-face 100% of this time.   Medications (Filter: Administrations occurring from 1158 to 1308 on 09/01/22)  important  Continuous medications are totaled by the amount administered until 09/01/22 1308.   Heparin (Porcine) in NaCl 1000-0.9 UT/500ML-% SOLN (mL)  Total volume: 1,000 mL Date/Time Rate/Dose/Volume Action   09/01/22 1203 500 mL Given   1203 500 mL Given   fentaNYL (SUBLIMAZE) injection (mcg)  Total dose: 50 mcg Date/Time Rate/Dose/Volume Action   09/01/22 1203 25 mcg Given   1220 25 mcg Given   midazolam (VERSED) injection (mg)  Total dose: 3 mg Date/Time Rate/Dose/Volume Action   09/01/22 1203 2 mg Given   1220 1 mg Given   verapamil (ISOPTIN) injection (mg)  Total dose: 2.5 mg Date/Time Rate/Dose/Volume Action   09/01/22 1216 2.5 mg Given   lidocaine (PF) (XYLOCAINE) 1 % injection (mL)  Total volume: 2 mL Date/Time Rate/Dose/Volume Action   09/01/22 1217 2 mL Given   heparin sodium (porcine) injection (Units)  Total dose: 5,000 Units Date/Time Rate/Dose/Volume Action   09/01/22 1219 5,000 Units Given   nitroGLYCERIN 50 mg in dextrose 5 % 250 mL (0.2 mg/mL) infusion (mcg/min)  Total dose: 344.5 mcg Date/Time Rate/Dose/Volume  Action   09/01/22 1233 10 mcg/min - 3 mL/hr New Bag/Given   furosemide (LASIX) injection (mg)  Total dose: 20 mg Date/Time Rate/Dose/Volume Action   09/01/22 1244 20 mg Given   iohexol (OMNIPAQUE) 300 MG/ML solution (mL)  Total volume: 61 mL Date/Time Rate/Dose/Volume Action   09/01/22 1251 61 mL Given    Sedation Time  Sedation Time Physician-1: 31 minutes 29 seconds Contrast     Administrations occurring from 1158 to 1308 on 09/01/22:  Medication Name Total Dose  iohexol (OMNIPAQUE) 300 MG/ML solution 61 mL   Radiation/Fluoro  Fluoro time: 6 (min) DAP: 35 (Gycm2) Cumulative Air Kerma: 466 (mGy) Complications  Complications documented before study signed (09/01/2022  1:21 PM)   No complications were associated with this study.  Documented by Shirlean Schlein, RRT - 09/01/2022 12:53 PM     Coronary Findings  Diagnostic Dominance: Right Left Main  Vessel is large. Vessel is angiographically normal.    Left Anterior Descending  Vessel is moderate in size.  Ost LAD lesion is 90% stenosed. The lesion is eccentric.    First Diagonal Branch  Vessel is moderate in size.    Second Diagonal Branch  Vessel is moderate in size.    Left Circumflex  Vessel is large.    First Obtuse Marginal Branch  Vessel is small in size.    Second Obtuse Marginal Branch  Vessel is small in size.    Third Obtuse Marginal Branch  Vessel is moderate in size.    Fourth Obtuse Marginal Branch  Vessel is small in size.    Right Coronary Artery  Vessel is large.  Prox RCA lesion is 40% stenosed.    Right Posterior Descending Artery  Vessel is large in size.    Right Posterior Atrioventricular Artery  Vessel is moderate in size.    First Right Posterolateral Branch  Vessel is small in size.    Second Right Posterolateral Branch  Vessel is small in size.    Intervention   No interventions have been documented.   Wall Motion     The following segments are  akinetic: mid anterior and apical anterior. The following segments are normal: mid inferior, basilar anterior, basilar inferior and apical inferior.           Left Heart  Left Ventricle The left ventricular size is normal. There is moderate to severe left ventricular systolic dysfunction. LVEDP 30-35%. LV end diastolic pressure is moderately elevated. LVEDP 25 mmHg. There are LV function abnormalities due to segmental dysfunction.  Aortic Valve There is no aortic valve stenosis.   Coronary Diagrams  Diagnostic Dominance: Right  Intervention   Implants   No implant documentation for this case.   Syngo Images   Show images for CARDIAC CATHETERIZATION Images on Long Term Storage   Show images for Briseyda, [place] to Procedure Log  Procedure Log    Hemo Data  Flowsheet Row Most Recent Value  AO Systolic Pressure 128 mmHg  AO Diastolic Pressure 96 mmHg  AO Mean 112 mmHg  LV Systolic Pressure 114 mmHg  LV Diastolic Pressure 1 mmHg  LV EDP 21 mmHg  AOp Systolic Pressure 98 mmHg  AOp Diastolic Pressure 69 mmHg  AOp Mean Pressure 83 mmHg  LVp Systolic Pressure 100 mmHg  LVp Diastolic Pressure 28 mmHg  LVp EDP Pressure 23 mmHg    ECHOCARDIOGRAM REPORT       Patient Name:   Elizabeth Spencer Date of Exam: 09/01/2022  Medical Rec #:  [id number]     Height:       67.0 in  Accession #:    [id number]    Weight:       220.0 lb  Date of Birth:  1985/07/16      BSA:          2.106 m  Patient Age:    37 years      BP:           99/68 mmHg  Patient Gender: F             HR:           109 bpm.  Exam Location:  [facility]   Procedure:  2D Echo, Cardiac Doppler and Color Doppler   Indications:     Chest pain R07.9    History:         Patient has no prior history of Echocardiogram  examinations.    Sonographer:    Cristela Blue  Referring Phys:  [id number] CADENCE H FURTH  Diagnosing Phys: Debbe Odea MD   IMPRESSIONS     1. Left ventricular ejection fraction, by estimation,  is 30 to 35%. The  left ventricle has moderate to severely decreased function. The left  ventricle demonstrates global hypokinesis. There is mild left ventricular  hypertrophy. Left ventricular  diastolic parameters are consistent with Grade I diastolic dysfunction  (impaired relaxation).   2. Right ventricular systolic function is normal. The right ventricular  size is not well visualized.   3. The mitral valve is normal in structure. Mild mitral valve  regurgitation.   4. The aortic valve was not well visualized. Aortic valve regurgitation  is not visualized.   FINDINGS   Left Ventricle: Left ventricular ejection fraction, by estimation, is 30  to 35%. The left ventricle has moderate to severely decreased function.  The left ventricle demonstrates global hypokinesis. The left ventricular  internal cavity size was normal in  size. There is mild left ventricular hypertrophy. Left ventricular  diastolic parameters are consistent with Grade I diastolic dysfunction  (impaired relaxation).   Right Ventricle: The right ventricular size is not well visualized. No  increase in right ventricular wall thickness. Right ventricular systolic  function is normal.   Left Atrium: Left atrial size was normal in size.   Right Atrium: Right atrial size was normal in size.   Pericardium: There is no evidence of pericardial effusion.   Mitral Valve: The mitral valve is normal in structure. Mild mitral valve  regurgitation.   Tricuspid Valve: The tricuspid valve is not well visualized. Tricuspid  valve regurgitation is not demonstrated.   Aortic Valve: The aortic valve was not well visualized. Aortic valve  regurgitation is not visualized. Aortic valve mean gradient measures 3.0  mmHg. Aortic valve peak gradient measures 6.0 mmHg. Aortic valve area, by  VTI measures 3.36 cm.   Pulmonic Valve: The pulmonic valve was not well visualized. Pulmonic valve  regurgitation is not visualized.    Aorta: The aortic root is normal in size and structure.   IAS/Shunts: No atrial level shunt detected by color flow Doppler.     LEFT VENTRICLE  PLAX 2D  LVIDd:         5.40 cm      Diastology  LVIDs:         4.50 cm      LV e' medial:   6.09 cm/s  LV PW:         1.10 cm      LV E/e' medial: 10.4  LV IVS:        1.20 cm  LVOT diam:     2.10 cm  LV SV:         55  LV SV Index:   26  LVOT Area:     3.46 cm    LV Volumes (MOD)  LV vol d, MOD A2C: 134.0 ml  LV vol d, MOD A4C: 151.0 ml  LV vol s, MOD A2C: 93.4 ml  LV vol s, MOD A4C: 114.0 ml  LV SV MOD A2C:     40.6 ml  LV SV MOD A4C:     151.0 ml  LV SV MOD  BP:      38.2 ml   RIGHT VENTRICLE  RV Basal diam:  2.10 cm  RV Mid diam:    1.60 cm  RV S prime:     11.70 cm/s  TAPSE (M-mode): 2.0 cm   LEFT ATRIUM             Index        RIGHT ATRIUM          Index  LA diam:        4.00 cm 1.90 cm/m   RA Area:     5.70 cm  LA Vol (A2C):   53.0 ml 25.17 ml/m  RA Volume:   6.91 ml  3.28 ml/m  LA Vol (A4C):   29.9 ml 14.20 ml/m  LA Biplane Vol: 39.7 ml 18.85 ml/m   AORTIC VALVE  AV Area (Vmax):    2.45 cm  AV Area (Vmean):   2.63 cm  AV Area (VTI):     3.36 cm  AV Vmax:           122.00 cm/s  AV Vmean:          80.000 cm/s  AV VTI:            0.164 m  AV Peak Grad:      6.0 mmHg  AV Mean Grad:      3.0 mmHg  LVOT Vmax:         86.20 cm/s  LVOT Vmean:        60.800 cm/s  LVOT VTI:          0.159 m  LVOT/AV VTI ratio: 0.97    AORTA  Ao Root diam: 3.20 cm   MITRAL VALVE               TRICUSPID VALVE  MV Area (PHT): 6.83 cm    TR Peak grad:   11.6 mmHg  MV Decel Time: 111 msec    TR Vmax:        170.00 cm/s  MV E velocity: 63.40 cm/s  MV A velocity: 96.90 cm/s  SHUNTS  MV E/A ratio:  0.65        Systemic VTI:  0.16 m                             Systemic Diam: 2.10 cm   Debbe Odea MD  Electronically signed by Debbe Odea MD  Signature Date/Time: 09/01/2022/4:32:46 PM        Final       Assessment/Plan:  This 37 year old woman has high-grade ostial LAD stenosis presenting with a 3-week history of recurrent chest discomfort and NSTEMI.  Echo shows a reduced ejection fraction of 30 to 35% with global hypokinesis.  I agree that coronary bypass graft surgery is indicated for relief of her symptoms and to prevent further myocardial loss.  This will require a LIMA graft to the LAD. I discussed the operative procedure with the patient  including alternatives, benefits and risks; including but not limited to bleeding, blood transfusion, infection, stroke, myocardial infarction, graft failure, heart block requiring a permanent pacemaker, organ dysfunction, and death.  Thayer Dallas understands and agrees to proceed.  We will schedule surgery for tomorrow morning.  Alleen Borne 09/01/2022, 8:11 PM

## 2022-09-01 NOTE — Consult Note (Signed)
Pharmacy Antibiotic Note  Elizabeth Spencer is a 37 y.o. female admitted on 08/31/2022 with bacteremia.  Pharmacy has been consulted for Vancomycin dosing.  Plan: Vancomycin 2000mg  IV x 1 , then Vancomycin 1000 mg IV Q 12 hrs.  Goal AUC 400-550. Expected AUC: 497.2 SCr used: 0.8 (actual Scr 0.55) Expected Css min: 13.3   Height: 5\' 7"  (170.2 cm) Weight: 99.8 kg (220 lb 0.3 oz) IBW/kg (Calculated) : 61.6  Temp (24hrs), Avg:98.1 F (36.7 C), Min:97.8 F (36.6 C), Max:98.3 F (36.8 C)  Recent Labs  Lab 08/31/22 1633 09/01/22 0202  WBC 8.8 8.4  CREATININE 0.62 0.55    Estimated Creatinine Clearance: 114.6 mL/min (by C-G formula based on SCr of 0.55 mg/dL).    Allergies  Allergen Reactions   Azithromycin Swelling and Other (See Comments)   Clindamycin Swelling   Codeine Hives and Other (See Comments)    Nausea/dizzy Nausea/dizzy   Meloxicam Other (See Comments)    Burns up from the inside    Neurontin [Gabapentin] Other (See Comments)    Makes her pass out and not remember what happened prior to taking med.   Penicillins Anaphylaxis    Patient allergic to all "cillins" Has patient had a PCN reaction causing immediate rash, facial/tongue/throat swelling, SOB or lightheadedness with hypotension: Yes Has patient had a PCN reaction causing severe rash involving mucus membranes or skin necrosis: Yes Has patient had a PCN reaction that required hospitalization: No Has patient had a PCN reaction occurring within the last 10 years: Yes If all of the above answers are "NO", then may proceed with Cephalosporin use.    Clindamycin/Lincomycin Swelling   Other Hives and Other (See Comments)    Berry flavored food and drinks   Diphenhydramine Hcl Rash and Other (See Comments)   Doxycycline Rash    Antimicrobials this admission: Vancomycin 5/29 >>    Dose adjustments this admission: na  Microbiology results: 5/28 BCx: 2 of 4 (1 set) GPC in chains>>Strep spp, no resistance  detected    Thank you for allowing pharmacy to be a part of this patient's care.  Rhylee Nunn Rodriguez-Guzman PharmD, BCPS 09/01/2022 12:12 PM

## 2022-09-01 NOTE — Consult Note (Signed)
    [address]       [place] [address]             [phone]      Cardiothoracic Surgery Consultation  Reason for Consult: Severe single vessel coronary artery disease s/p NSTEMI Referring Physician: Dr. Chris End  Elizabeth Spencer is an 37 y.o. female.  HPI:   The patient is a 37-year-old woman with a history of bipolar disorder, tobacco abuse, recent cocaine use, prediabetes, hyperlipidemia, and GERD who reports developing substernal chest discomfort radiating down her left arm on Mother's Day.  Over the last couple weeks she has had recurrent episodes of chest discomfort that have been accelerating.  She presented to [facility] after developing severe chest pain that would not resolve.  She was found to have elevated troponin I of 317 which increased to 1163 yesterday morning therefore decreasing to 765 this morning.  Cardiac catheterization this morning showed a hazy and eccentric 90% ostial LAD stenosis concerning for acute plaque rupture.  Left circumflex had no significant disease.  The proximal RCA had about 40% proximal stenosis.  Left ventricular ejection fraction was about 30 to 35% with anterior wall hypokinesis/akinesis.  LVEDP was increased to 25.  The patient was started on nitroglycerin drip which has been titrated and she has remained pain-free.  Intra-aortic balloon pump was deferred due to her inability to lie flat and remains still.  She was transferred to [place] late this afternoon for coronary bypass surgery tomorrow.  She is currently pain-free on nitroglycerin at 5.  Past Medical History:  Diagnosis Date   Aneurysm (HCC) 01/04/2014   Arthritis    Asthma    WELL CONTROLLED   Brain aneurysm 2007   NEUROLOGY NOTE DOES NOT MENTION ANEURYSM BUT PT STATES SHE DID NOT HAVE TO HAVE SURGERY   Complication of anesthesia    FOR 1 C-SECTION PT WAS ITCHING AND VERY RED ON HER FACE   Cough, persistent 01/27/2016   Dermatitis due to sunburn 11/08/2013    Family history of adverse reaction to anesthesia    brother, neice and nephew got red in face with hives   Gallstones    GERD (gastroesophageal reflux disease)    Gonorrhea 06/20/2020   Headache    CHRONIC HEADACHES   History of chronic cough    DRY   Loss of memory 05/30/2017   Perforation of left tympanic membrane 05/17/2013   Pneumonia    PTSD (post-traumatic stress disorder)    PTSD (post-traumatic stress disorder)    Thyroid condition    PT WAS JUST TOLD ON 08-25-17 THAT SHE HAS A THYROID PROBLEM AND IS GOING TO F/U WITH ENDOCRINOLOGIST IN 2 WEEKS   Trichomonas vaginalis (TV) infection 06/20/2020    Past Surgical History:  Procedure Laterality Date   ABDOMINAL HYSTERECTOMY     CESAREAN SECTION     x3   CYSTOSCOPY N/A 09/29/2017   Procedure: CYSTOSCOPY;  Surgeon: Staebler, Andreas, MD;  Location: [facility] ORS;  Service: Gynecology;  Laterality: N/A;   CYSTOSCOPY N/A 11/05/2017   Procedure: CYSTOSCOPY;  Surgeon: Staebler, Andreas, MD;  Location: [facility];  Service: Gynecology;  Laterality: N/A;   HYSTEROSCOPY WITH D & C N/A 09/01/2017   Procedure: DILATATION AND CURETTAGE /HYSTEROSCOPY;  Surgeon: Staebler, Andreas, MD;  Location: [facility];  Service: Gynecology;  Laterality: N/A;   LAPAROSCOPY N/A 09/01/2017   Procedure: LAPAROSCOPY OPERATIVE with biopsy;  Surgeon: Staebler, Andreas, MD;  Location: [facility];  Service: Gynecology;    Laterality: N/A;   LAPAROSCOPY N/A 11/05/2017   Procedure: LAPAROSCOPY DIAGNOSTIC;  Surgeon: Staebler, Andreas, MD;  Location: [facility];  Service: Gynecology;  Laterality: N/A;   REPAIR VAGINAL CUFF N/A 11/05/2017   Procedure: REPAIR VAGINAL CUFF;  Surgeon: Staebler, Andreas, MD;  Location: [facility];  Service: Gynecology;  Laterality: N/A;   TOTAL LAPAROSCOPIC HYSTERECTOMY WITH SALPINGECTOMY Bilateral 09/29/2017   Procedure: TOTAL LAPAROSCOPIC HYSTERECTOMY WITH SALPINGECTOMY;  Surgeon: Staebler, Andreas, MD;  Location: [facility];  Service: Gynecology;  Laterality:  Bilateral;    Family History  Problem Relation Age of Onset   Hypertension Mother    Alcohol abuse Mother    Deep vein thrombosis Mother    Alcohol abuse Father    COPD Father    Emphysema Father    Heart disease Father     Social History:  reports that she has been smoking cigarettes. She has a 9.00 pack-year smoking history. She has never used smokeless tobacco. She reports current alcohol use. She reports that she does not currently use drugs after having used the following drugs: Marijuana and Cocaine.  Allergies:  Allergies  Allergen Reactions   Azithromycin Swelling and Other (See Comments)   Clindamycin Swelling   Codeine Hives and Other (See Comments)    Nausea/dizzy Nausea/dizzy   Meloxicam Other (See Comments)    Burns up from the inside    Neurontin [Gabapentin] Other (See Comments)    Makes her pass out and not remember what happened prior to taking med.   Penicillins Anaphylaxis    Patient allergic to all "cillins" Has patient had a PCN reaction causing immediate rash, facial/tongue/throat swelling, SOB or lightheadedness with hypotension: Yes Has patient had a PCN reaction causing severe rash involving mucus membranes or skin necrosis: Yes Has patient had a PCN reaction that required hospitalization: No Has patient had a PCN reaction occurring within the last 10 years: Yes If all of the above answers are "NO", then may proceed with Cephalosporin use.    Clindamycin/Lincomycin Swelling   Other Hives and Other (See Comments)    Berry flavored food and drinks   Diphenhydramine Hcl Rash and Other (See Comments)   Doxycycline Rash   Egg-Derived Products Rash    Medications: I have reviewed the patient's current medications. Prior to Admission:  Medications Prior to Admission  Medication Sig Dispense Refill Last Dose   albuterol (VENTOLIN HFA) 108 (90 Base) MCG/ACT inhaler Inhale 2 puffs into the lungs every 6 (six) hours as needed for wheezing or shortness of  breath. 1 each 5    [START ON 09/02/2022] aspirin 81 MG chewable tablet Chew 1 tablet (81 mg total) by mouth daily.      [START ON 09/02/2022] atorvastatin (LIPITOR) 40 MG tablet Take 1 tablet (40 mg total) by mouth daily.      cetirizine (ZYRTEC) 10 MG tablet Take 1 tablet (10 mg total) by mouth daily. 90 tablet 1    EPIPEN 2-PAK 0.3 MG/0.3ML SOAJ injection INJECT INTO THIGH MUSCLE THROUGH CLOTHES AS NEEDED SEVERE ALLERGIC REACTION 1 each 1    fluticasone (FLONASE) 50 MCG/ACT nasal spray 1 spray by Each Nare route daily. 16 g 6    methimazole (TAPAZOLE) 5 MG tablet Take by mouth.      metoprolol tartrate (LOPRESSOR) 25 MG tablet Take 1 tablet (25 mg total) by mouth 2 (two) times daily.      montelukast (SINGULAIR) 10 MG tablet Take 1 tablet (10 mg total) by mouth daily. 90 tablet 1      Multiple Vitamins-Minerals (CENTRUM ADULTS PO) Take 1 tablet by mouth at bedtime.       nitroGLYCERIN (NITROSTAT) 0.4 MG SL tablet Place 1 tablet (0.4 mg total) under the tongue every 5 (five) minutes as needed for chest pain.  12    omeprazole (PRILOSEC) 20 MG capsule Take 1 capsule (20 mg total) by mouth 2 (two) times daily before a meal. 180 capsule 1    phentermine (ADIPEX-P) 37.5 MG tablet Take 1 tablet (37.5 mg total) by mouth daily before breakfast. 30 tablet 0    Scheduled:  [START ON 09/02/2022] aspirin EC  81 mg Oral Daily   [START ON 09/02/2022] atorvastatin  40 mg Oral Daily   bisacodyl  5 mg Oral Once   [START ON 09/02/2022] chlorhexidine  15 mL Mouth/Throat Once   Chlorhexidine Gluconate Cloth  6 each Topical Daily   Chlorhexidine Gluconate Cloth  6 each Topical Once   And   Chlorhexidine Gluconate Cloth  6 each Topical Once   [START ON 09/02/2022] diazepam  5 mg Oral Once   [START ON 09/02/2022] methimazole  10 mg Oral Daily   [START ON 09/02/2022] metoprolol tartrate  12.5 mg Oral Once   metoprolol tartrate  25 mg Oral BID   Continuous:  heparin 1,000 Units/hr (09/01/22 1841)   nitroGLYCERIN 5  mcg/min (09/01/22 1841)   PRN:acetaminophen, nitroGLYCERIN, ondansetron (ZOFRAN) IV, temazepam Anti-infectives (From admission, onward)    None       Results for orders placed or performed during the hospital encounter of 09/01/22 (from the past 48 hour(s))  Surgical pcr screen     Status: None   Collection Time: 09/01/22  5:04 PM   Specimen: Nasal Mucosa; Nasal Swab  Result Value Ref Range   MRSA, PCR NEGATIVE NEGATIVE   Staphylococcus aureus NEGATIVE NEGATIVE    Comment: (NOTE) The Xpert SA Assay (FDA approved for NASAL specimens in patients 22 years of age and older), is one component of a comprehensive surveillance program. It is not intended to diagnose infection nor to guide or monitor treatment. Performed at [facility], [address] [place], [place] [address]   Type and screen [facility]     Status: None   Collection Time: 09/01/22  6:23 PM  Result Value Ref Range   ABO/RH(D) A NEG    Antibody Screen NEG    Sample Expiration      09/04/2022,2359 Performed at [facility], [address] [place] [facility] [place], [place] [address]     VAS US DOPPLER PRE CABG  Result Date: 09/01/2022 PREOPERATIVE VASCULAR EVALUATION Patient Name:  Laquanna Barbe  Date of Exam:   09/01/2022 Medical Rec #: [id number]      Accession #:    [id number] Date of Birth: 11/10/1985       Patient Gender: F Patient Age:   37 years Exam Location:  [facility] Procedure:      VAS US DOPPLER PRE CABG Referring Phys: Reiley Bertagnolli --------------------------------------------------------------------------------  Indications:      Pre-CABG. Risk Factors:     None. Limitations:      Left arm pain, S/P heart cath Comparison Study: No prior studies. Performing Technologist: Collins, Greg RVT  Examination Guidelines: A complete evaluation includes B-mode imaging, spectral Doppler, color Doppler, and power Doppler as needed of all accessible portions of each vessel. Bilateral testing is  considered an integral part of a complete examination. Limited examinations for reoccurring indications may be performed as noted.  Right   Carotid Findings: +----------+--------+--------+--------+-----------------------+--------+           PSV cm/sEDV cm/sStenosisDescribe               Comments +----------+--------+--------+--------+-----------------------+--------+ CCA Prox  85      14              smooth and heterogenous         +----------+--------+--------+--------+-----------------------+--------+ CCA Distal84      13              smooth and heterogenous         +----------+--------+--------+--------+-----------------------+--------+ ICA Prox  85      19              smooth and heterogenous         +----------+--------+--------+--------+-----------------------+--------+ ICA Mid   47      21              smooth and heterogenous         +----------+--------+--------+--------+-----------------------+--------+ ICA Distal57      27                                              +----------+--------+--------+--------+-----------------------+--------+ ECA       84      13                                              +----------+--------+--------+--------+-----------------------+--------+ +----------+--------+-------+--------+------------+           PSV cm/sEDV cmsDescribeArm Pressure +----------+--------+-------+--------+------------+ Subclavian115                                 +----------+--------+-------+--------+------------+ +---------+--------+--+--------+--+---------+ VertebralPSV cm/s37EDV cm/s13Antegrade +---------+--------+--+--------+--+---------+ Left Carotid Findings: +----------+--------+--------+--------+-----------------------+--------+           PSV cm/sEDV cm/sStenosisDescribe               Comments +----------+--------+--------+--------+-----------------------+--------+ CCA Prox  106     27                                               +----------+--------+--------+--------+-----------------------+--------+ CCA Distal100     24              smooth and heterogenous         +----------+--------+--------+--------+-----------------------+--------+ ICA Prox  54      20              smooth and heterogenous         +----------+--------+--------+--------+-----------------------+--------+ ICA Mid   67      23                                              +----------+--------+--------+--------+-----------------------+--------+ ICA Distal59      27                                              +----------+--------+--------+--------+-----------------------+--------+   ECA       77      21                                              +----------+--------+--------+--------+-----------------------+--------+  +----------+--------+--------+--------+------------+ SubclavianPSV cm/sEDV cm/sDescribeArm Pressure +----------+--------+--------+--------+------------+           119                                  +----------+--------+--------+--------+------------+ +---------+--------+--+--------+-+---------+ VertebralPSV cm/s27EDV cm/s8Antegrade +---------+--------+--+--------+-+---------+  ABI Findings: +--------+------------------+-----+---------+--------+ Right   Rt Pressure (mmHg)IndexWaveform Comment  +--------+------------------+-----+---------+--------+ Brachial103                    triphasic         +--------+------------------+-----+---------+--------+ PTA     118               1.15 triphasic         +--------+------------------+-----+---------+--------+ DP      115               1.12 triphasic         +--------+------------------+-----+---------+--------+ +--------+------------------+-----+---------+--------------+ Left    Lt Pressure (mmHg)IndexWaveform Comment        +--------+------------------+-----+---------+--------------+ Brachial                                 Restricted arm +--------+------------------+-----+---------+--------------+ PTA     113               1.10 triphasic               +--------+------------------+-----+---------+--------------+ DP      100               0.97 triphasic               +--------+------------------+-----+---------+--------------+ +-------+---------------+----------------+ ABI/TBIToday's ABI/TBIPrevious ABI/TBI +-------+---------------+----------------+ Right  1.15                            +-------+---------------+----------------+ Left   1.1                             +-------+---------------+----------------+  Right Doppler Findings: +--------+--------+-----+---------+--------+ Site    PressureIndexDoppler  Comments +--------+--------+-----+---------+--------+ Brachial103          triphasic         +--------+--------+-----+---------+--------+ Radial               triphasic         +--------+--------+-----+---------+--------+ Ulnar                triphasic         +--------+--------+-----+---------+--------+  Left Doppler Findings: +--------+--------+-----+-------+--------------+ Site    PressureIndexDopplerComments       +--------+--------+-----+-------+--------------+ Brachial                    Restricted arm +--------+--------+-----+-------+--------------+   Summary: Right Carotid: Velocities in the right ICA are consistent with a 1-39% stenosis. Left Carotid: Velocities in the left ICA are consistent with a 1-39% stenosis. Vertebrals: Bilateral vertebral arteries demonstrate antegrade flow. Right ABI: Resting right ankle-brachial index is within normal range. Left ABI: Resting left ankle-brachial index is within normal range.   Right Upper Extremity: Doppler waveforms remain within normal limits with right radial compression. Doppler waveforms remain within normal limits with right ulnar compression.     Preliminary    ECHOCARDIOGRAM COMPLETE  Result Date:  09/01/2022    ECHOCARDIOGRAM REPORT   Patient Name:   Elizabeth Spencer Date of Exam: 09/01/2022 Medical Rec #:  [id number]     Height:       67.0 in Accession #:    [id number]    Weight:       220.0 lb Date of Birth:  12/29/1985      BSA:          2.106 m Patient Age:    37 years      BP:           99/68 mmHg Patient Gender: F             HR:           109 bpm. Exam Location:  [facility] Procedure: 2D Echo, Cardiac Doppler and Color Doppler Indications:     Chest pain R07.9  History:         Patient has no prior history of Echocardiogram examinations.  Sonographer:     Jerry Hege Referring Phys:  [id number] CADENCE H FURTH Diagnosing Phys: Brian Agbor-Etang MD IMPRESSIONS  1. Left ventricular ejection fraction, by estimation, is 30 to 35%. The left ventricle has moderate to severely decreased function. The left ventricle demonstrates global hypokinesis. There is mild left ventricular hypertrophy. Left ventricular diastolic parameters are consistent with Grade I diastolic dysfunction (impaired relaxation).  2. Right ventricular systolic function is normal. The right ventricular size is not well visualized.  3. The mitral valve is normal in structure. Mild mitral valve regurgitation.  4. The aortic valve was not well visualized. Aortic valve regurgitation is not visualized. FINDINGS  Left Ventricle: Left ventricular ejection fraction, by estimation, is 30 to 35%. The left ventricle has moderate to severely decreased function. The left ventricle demonstrates global hypokinesis. The left ventricular internal cavity size was normal in size. There is mild left ventricular hypertrophy. Left ventricular diastolic parameters are consistent with Grade I diastolic dysfunction (impaired relaxation). Right Ventricle: The right ventricular size is not well visualized. No increase in right ventricular wall thickness. Right ventricular systolic function is normal. Left Atrium: Left atrial size was normal in size. Right Atrium: Right atrial size  was normal in size. Pericardium: There is no evidence of pericardial effusion. Mitral Valve: The mitral valve is normal in structure. Mild mitral valve regurgitation. Tricuspid Valve: The tricuspid valve is not well visualized. Tricuspid valve regurgitation is not demonstrated. Aortic Valve: The aortic valve was not well visualized. Aortic valve regurgitation is not visualized. Aortic valve mean gradient measures 3.0 mmHg. Aortic valve peak gradient measures 6.0 mmHg. Aortic valve area, by VTI measures 3.36 cm. Pulmonic Valve: The pulmonic valve was not well visualized. Pulmonic valve regurgitation is not visualized. Aorta: The aortic root is normal in size and structure. IAS/Shunts: No atrial level shunt detected by color flow Doppler.  LEFT VENTRICLE PLAX 2D LVIDd:         5.40 cm      Diastology LVIDs:         4.50 cm      LV e' medial:   6.09 cm/s LV PW:         1.10 cm      LV E/e' medial: 10.4 LV IVS:        1.20 cm   LVOT diam:     2.10 cm LV SV:         55 LV SV Index:   26 LVOT Area:     3.46 cm  LV Volumes (MOD) LV vol d, MOD A2C: 134.0 ml LV vol d, MOD A4C: 151.0 ml LV vol s, MOD A2C: 93.4 ml LV vol s, MOD A4C: 114.0 ml LV SV MOD A2C:     40.6 ml LV SV MOD A4C:     151.0 ml LV SV MOD BP:      38.2 ml RIGHT VENTRICLE RV Basal diam:  2.10 cm RV Mid diam:    1.60 cm RV S prime:     11.70 cm/s TAPSE (M-mode): 2.0 cm LEFT ATRIUM             Index        RIGHT ATRIUM          Index LA diam:        4.00 cm 1.90 cm/m   RA Area:     5.70 cm LA Vol (A2C):   53.0 ml 25.17 ml/m  RA Volume:   6.91 ml  3.28 ml/m LA Vol (A4C):   29.9 ml 14.20 ml/m LA Biplane Vol: 39.7 ml 18.85 ml/m  AORTIC VALVE AV Area (Vmax):    2.45 cm AV Area (Vmean):   2.63 cm AV Area (VTI):     3.36 cm AV Vmax:           122.00 cm/s AV Vmean:          80.000 cm/s AV VTI:            0.164 m AV Peak Grad:      6.0 mmHg AV Mean Grad:      3.0 mmHg LVOT Vmax:         86.20 cm/s LVOT Vmean:        60.800 cm/s LVOT VTI:          0.159 m LVOT/AV  VTI ratio: 0.97  AORTA Ao Root diam: 3.20 cm MITRAL VALVE               TRICUSPID VALVE MV Area (PHT): 6.83 cm    TR Peak grad:   11.6 mmHg MV Decel Time: 111 msec    TR Vmax:        170.00 cm/s MV E velocity: 63.40 cm/s MV A velocity: 96.90 cm/s  SHUNTS MV E/A ratio:  0.65        Systemic VTI:  0.16 m                            Systemic Diam: 2.10 cm Brian Agbor-Etang MD Electronically signed by Brian Agbor-Etang MD Signature Date/Time: 09/01/2022/4:32:46 PM    Final    CARDIAC CATHETERIZATION  Result Date: 09/01/2022 Conclusions: Severe single-vessel coronary artery disease, with hazy and eccentric 90% ostial LAD stenosis concerning for acute plaque rupture.  Tubular 40% proximal RCA stenosis is also present.  Large LCx without significant disease. Moderately to severely reduced left ventricular systolic function (LVEF 30-35%) with anterior wall hypokinesis/akinesis.  LVEF 30-35%. Moderately elevated left ventricular filling pressure (LVEDP 25 mmHg). Recommendations: Critical ostial LAD disease not well-suited for PCI given risk for compromise of LCx and D1.  Images reviewed with interventional cardiology and cardiac surgery teams at [facility]; we will arrange for transfer to [place] with plans for CABG tomorrow. Restart heparin infusion 2 hours   after TR band removal. Titrate NTG infusion for relief of chest pain.  IABP deferred due to patient's inability to lie flat and remain still. Aggressive secondary prevention of coronary artery disease. Obtain echocardiogram. Christopher End, MD [facility]  US Abdomen Limited RUQ (LIVER/GB)  Result Date: 08/31/2022 CLINICAL DATA:  [id number] Vomiting [id number] EXAM: ULTRASOUND ABDOMEN LIMITED RIGHT UPPER QUADRANT COMPARISON:  CT angiography chest 08/31/2022 FINDINGS: Gallbladder: Multiple calcified gallstones within the gallbladder lumen. Associated gallbladder wall thickening of 4 mm. No pericholecystic fluid. No sonographic Murphy sign noted by sonographer.  Common bile duct: Diameter: 3 mm Liver: No focal lesion identified. Increased parenchymal echogenicity. Portal vein is patent on color Doppler imaging with normal direction of blood flow towards the liver. Other: None. IMPRESSION: 1. Cholelithiasis with nonspecific mild gallbladder wall thickening. Negative sonographic Murphy sign and no pericholecystic fluid. No definite findings of acute cholecystitis. 2. Hepatic steatosis. Please note limited evaluation for focal hepatic masses in a patient with hepatic steatosis due to decreased penetration of the acoustic ultrasound waves. Electronically Signed   By: Morgane  Naveau M.D.   On: 08/31/2022 21:34   CT Angio Chest PE W and/or Wo Contrast  Result Date: 08/31/2022 CLINICAL DATA:  Chest pain EXAM: CT ANGIOGRAPHY CHEST WITH CONTRAST TECHNIQUE: Multidetector CT imaging of the chest was performed using the standard protocol during bolus administration of intravenous contrast. Multiplanar CT image reconstructions and MIPs were obtained to evaluate the vascular anatomy. RADIATION DOSE REDUCTION: This exam was performed according to the departmental dose-optimization program which includes automated exposure control, adjustment of the mA and/or kV according to patient size and/or use of iterative reconstruction technique. CONTRAST:  75mL OMNIPAQUE IOHEXOL 350 MG/ML SOLN COMPARISON:  None Available. FINDINGS: Cardiovascular: No filling defects in the pulmonary arteries to suggest pulmonary emboli. Heart is normal size. Aorta is normal caliber. Mediastinum/Nodes: No mediastinal, hilar, or axillary adenopathy. Trachea and esophagus are unremarkable. Thyroid unremarkable. Lungs/Pleura: Lungs are clear. No focal airspace opacities or effusions. 7 mm nodule in the right lower lobe at the right lung base posteriorly on image 118 of series 5. Upper Abdomen: Multiple gallstones within the gallbladder. No acute findings. Musculoskeletal: Chest wall soft tissues are unremarkable.  No acute bony abnormality. Review of the MIP images confirms the above findings. IMPRESSION: No evidence of pulmonary embolus. 7 mm right lower lobe pulmonary nodule. Non-contrast chest CT at 6-12 months is recommended. If the nodule is stable at time of repeat CT, then future CT at 18-24 months (from today's scan) is considered optional for low-risk patients, but is recommended for high-risk patients. This recommendation follows the consensus statement: Guidelines for Management of Incidental Pulmonary Nodules Detected on CT Images: From the Fleischner Society 2017; Radiology 2017; 284:228-243. Cholelithiasis. Electronically Signed   By: Kevin  Dover M.D.   On: 08/31/2022 19:17   DG Chest 2 View  Result Date: 08/31/2022 CLINICAL DATA:  Chest pain x2 weeks with vomiting. EXAM: CHEST - 2 VIEW COMPARISON:  September 18, 2018 FINDINGS: The heart size and mediastinal contours are within normal limits. Low lung volumes are noted. Both lungs are clear. The visualized skeletal structures are unremarkable. IMPRESSION: No active cardiopulmonary disease. Electronically Signed   By: Thaddeus  Houston M.D.   On: 08/31/2022 17:04    Review of Systems  Constitutional:  Positive for activity change, diaphoresis and fatigue.  HENT: Negative.    Eyes: Negative.   Respiratory:  Positive for shortness of breath.   Cardiovascular:  Positive for chest pain. Negative for palpitations   and leg swelling.  Gastrointestinal:  Positive for nausea and vomiting.  Endocrine: Negative.   Genitourinary: Negative.   Musculoskeletal: Negative.   Skin: Negative.   Allergic/Immunologic: Negative.   Neurological:  Negative for dizziness and syncope.  Hematological: Negative.   Psychiatric/Behavioral:         Bipolar disorder   Blood pressure (!) 86/64, pulse (!) 114, resp. rate 13, height 5' 7" (1.702 m), weight 88 kg, last menstrual period 08/22/2017, SpO2 98 %. Physical Exam Constitutional:      Appearance: Normal appearance. She  is obese.  HENT:     Head: Normocephalic and atraumatic.     Mouth/Throat:     Mouth: Mucous membranes are moist.     Pharynx: Oropharynx is clear.     Comments: Poor dentition Eyes:     Extraocular Movements: Extraocular movements intact.     Conjunctiva/sclera: Conjunctivae normal.     Pupils: Pupils are equal, round, and reactive to light.  Neck:     Vascular: No carotid bruit.  Cardiovascular:     Rate and Rhythm: Normal rate and regular rhythm.     Pulses: Normal pulses.     Heart sounds: Normal heart sounds. No murmur heard. Pulmonary:     Effort: Pulmonary effort is normal.     Breath sounds: Normal breath sounds.  Abdominal:     General: Bowel sounds are normal. There is no distension.     Tenderness: There is no abdominal tenderness.  Musculoskeletal:        General: No swelling.  Skin:    General: Skin is warm and dry.  Neurological:     General: No focal deficit present.     Mental Status: She is alert and oriented to person, place, and time.  Psychiatric:        Mood and Affect: Mood normal.        Behavior: Behavior normal.   Physicians  Panel Physicians Referring Physician Case Authorizing Physician  End, Christopher, MD (Primary)  Furth, Cadence H, PA-C   Procedures  LEFT HEART CATH AND CORONARY ANGIOGRAPHY   Conclusion  Conclusions: Severe single-vessel coronary artery disease, with hazy and eccentric 90% ostial LAD stenosis concerning for acute plaque rupture.  Tubular 40% proximal RCA stenosis is also present.  Large LCx without significant disease. Moderately to severely reduced left ventricular systolic function (LVEF 30-35%) with anterior wall hypokinesis/akinesis.  LVEF 30-35%. Moderately elevated left ventricular filling pressure (LVEDP 25 mmHg).   Recommendations: Critical ostial LAD disease not well-suited for PCI given risk for compromise of LCx and D1.  Images reviewed with interventional cardiology and cardiac surgery teams at [place];  we will arrange for transfer to [place] with plans for CABG tomorrow. Restart heparin infusion 2 hours after TR band removal. Titrate NTG infusion for relief of chest pain.  IABP deferred due to patient's inability to lie flat and remain still. Aggressive secondary prevention of coronary artery disease. Obtain echocardiogram.   Christopher End, MD [facility]     Recommendations  Antiplatelet/Anticoag Continue aspirin 81 mg daily.  Restart heparin infusion 2 hours after TR band removal.  Defer addition of P2Y12 pending CABG.  Discharge Date Anticipated discharge date to be determined.   Indications  Non-ST elevation (NSTEMI) myocardial infarction (HCC) [I21.4 (ICD-10-CM)]   Clinical Presentation  CHF/Shock Congestive heart failure not present. No shock present.   Procedural Details  Technical Details Indication: 37 y.o. year-old bipolar affective disorder, GERD, migraines, chronic low back pain, prediabetes, hyperlipidemia,   and polysubstance abuse, presenting with progressive chest pain and shortness of breath x 3 weeks and significantly worse over the last 3 days.  She was found to have elevated HS-TnI, consistent with NSTEMI.  GFR: >60 ml/min  Procedure: The risks, benefits, complications, treatment options, and expected outcomes were discussed with the patient. The patient and/or family concurred with the proposed plan, giving informed consent. The left wrist was prepped and draped in a sterile fashion. 1% lidocaine was used for local anesthesia. Ultrasound was used to evaluate the left radial artery. It was small but patent (measuring 1.8-2.0 mm in diameter).  A micropuncture needle was used to access the left radial artery under ultrasound guidance. Using the modified Seldinger access technique, a 5F slender Glidesheath was placed in the left radial artery. 3 mg Verapamil was given through the sheath. Heparin 5,000 units were administered.  Selective coronary angiography was  performed using a 5F TIG4.0 catheter to engage the left coronary artery and a 5F JR4 catheter to engage the right coronary artery. Left heart catheterization was performed using a 5F JR4 catheter. Left ventriculogram was performed with a power injection of contrast.  At the end of the procedure, the radial artery sheath was removed and a TR band applied to achieve patent hemostasis. There were no immediate complications. The patient was taken to the recovery area in stable condition.   Estimated blood loss <50 mL.   During this procedure medications were administered to achieve and maintain moderate conscious sedation while the patient's heart rate, blood pressure, and oxygen saturation were continuously monitored and I was present face-to-face 100% of this time.   Medications (Filter: Administrations occurring from 1158 to 1308 on 09/01/22)  important  Continuous medications are totaled by the amount administered until 09/01/22 1308.   Heparin (Porcine) in NaCl 1000-0.9 UT/500ML-% SOLN (mL)  Total volume: 1,000 mL Date/Time Rate/Dose/Volume Action   09/01/22 1203 500 mL Given   1203 500 mL Given   fentaNYL (SUBLIMAZE) injection (mcg)  Total dose: 50 mcg Date/Time Rate/Dose/Volume Action   09/01/22 1203 25 mcg Given   1220 25 mcg Given   midazolam (VERSED) injection (mg)  Total dose: 3 mg Date/Time Rate/Dose/Volume Action   09/01/22 1203 2 mg Given   1220 1 mg Given   verapamil (ISOPTIN) injection (mg)  Total dose: 2.5 mg Date/Time Rate/Dose/Volume Action   09/01/22 1216 2.5 mg Given   lidocaine (PF) (XYLOCAINE) 1 % injection (mL)  Total volume: 2 mL Date/Time Rate/Dose/Volume Action   09/01/22 1217 2 mL Given   heparin sodium (porcine) injection (Units)  Total dose: 5,000 Units Date/Time Rate/Dose/Volume Action   09/01/22 1219 5,000 Units Given   nitroGLYCERIN 50 mg in dextrose 5 % 250 mL (0.2 mg/mL) infusion (mcg/min)  Total dose: 344.5 mcg Date/Time Rate/Dose/Volume  Action   09/01/22 1233 10 mcg/min - 3 mL/hr New Bag/Given   furosemide (LASIX) injection (mg)  Total dose: 20 mg Date/Time Rate/Dose/Volume Action   09/01/22 1244 20 mg Given   iohexol (OMNIPAQUE) 300 MG/ML solution (mL)  Total volume: 61 mL Date/Time Rate/Dose/Volume Action   09/01/22 1251 61 mL Given    Sedation Time  Sedation Time Physician-1: 31 minutes 29 seconds Contrast     Administrations occurring from 1158 to 1308 on 09/01/22:  Medication Name Total Dose  iohexol (OMNIPAQUE) 300 MG/ML solution 61 mL   Radiation/Fluoro  Fluoro time: 6 (min) DAP: 35 (Gycm2) Cumulative Air Kerma: 466 (mGy) Complications  Complications documented before study signed (09/01/2022    1:21 PM)   No complications were associated with this study.  Documented by Mitchell, Alfreados T, RRT - 09/01/2022 12:53 PM     Coronary Findings  Diagnostic Dominance: Right Left Main  Vessel is large. Vessel is angiographically normal.    Left Anterior Descending  Vessel is moderate in size.  Ost LAD lesion is 90% stenosed. The lesion is eccentric.    First Diagonal Branch  Vessel is moderate in size.    Second Diagonal Branch  Vessel is moderate in size.    Left Circumflex  Vessel is large.    First Obtuse Marginal Branch  Vessel is small in size.    Second Obtuse Marginal Branch  Vessel is small in size.    Third Obtuse Marginal Branch  Vessel is moderate in size.    Fourth Obtuse Marginal Branch  Vessel is small in size.    Right Coronary Artery  Vessel is large.  Prox RCA lesion is 40% stenosed.    Right Posterior Descending Artery  Vessel is large in size.    Right Posterior Atrioventricular Artery  Vessel is moderate in size.    First Right Posterolateral Branch  Vessel is small in size.    Second Right Posterolateral Branch  Vessel is small in size.    Intervention   No interventions have been documented.   Wall Motion     The following segments are  akinetic: mid anterior and apical anterior. The following segments are normal: mid inferior, basilar anterior, basilar inferior and apical inferior.           Left Heart  Left Ventricle The left ventricular size is normal. There is moderate to severe left ventricular systolic dysfunction. LVEDP 30-35%. LV end diastolic pressure is moderately elevated. LVEDP 25 mmHg. There are LV function abnormalities due to segmental dysfunction.  Aortic Valve There is no aortic valve stenosis.   Coronary Diagrams  Diagnostic Dominance: Right  Intervention   Implants   No implant documentation for this case.   Syngo Images   Show images for CARDIAC CATHETERIZATION Images on Long Term Storage   Show images for Vonstein, Madissen Link to Procedure Log  Procedure Log    Hemo Data  Flowsheet Row Most Recent Value  AO Systolic Pressure 128 mmHg  AO Diastolic Pressure 96 mmHg  AO Mean 112 mmHg  LV Systolic Pressure 114 mmHg  LV Diastolic Pressure 1 mmHg  LV EDP 21 mmHg  AOp Systolic Pressure 98 mmHg  AOp Diastolic Pressure 69 mmHg  AOp Mean Pressure 83 mmHg  LVp Systolic Pressure 100 mmHg  LVp Diastolic Pressure 28 mmHg  LVp EDP Pressure 23 mmHg    ECHOCARDIOGRAM REPORT       Patient Name:   Elizabeth Spencer Date of Exam: 09/01/2022  Medical Rec #:  [id number]     Height:       67.0 in  Accession #:    [id number]    Weight:       220.0 lb  Date of Birth:  09/02/1985      BSA:          2.106 m  Patient Age:    37 years      BP:           99/68 mmHg  Patient Gender: F             HR:           109 bpm.  Exam Location:  [facility]   Procedure:   2D Echo, Cardiac Doppler and Color Doppler   Indications:     Chest pain R07.9    History:         Patient has no prior history of Echocardiogram  examinations.    Sonographer:    Jerry Hege  Referring Phys:  [id number] CADENCE H FURTH  Diagnosing Phys: Brian Agbor-Etang MD   IMPRESSIONS     1. Left ventricular ejection fraction, by estimation,  is 30 to 35%. The  left ventricle has moderate to severely decreased function. The left  ventricle demonstrates global hypokinesis. There is mild left ventricular  hypertrophy. Left ventricular  diastolic parameters are consistent with Grade I diastolic dysfunction  (impaired relaxation).   2. Right ventricular systolic function is normal. The right ventricular  size is not well visualized.   3. The mitral valve is normal in structure. Mild mitral valve  regurgitation.   4. The aortic valve was not well visualized. Aortic valve regurgitation  is not visualized.   FINDINGS   Left Ventricle: Left ventricular ejection fraction, by estimation, is 30  to 35%. The left ventricle has moderate to severely decreased function.  The left ventricle demonstrates global hypokinesis. The left ventricular  internal cavity size was normal in  size. There is mild left ventricular hypertrophy. Left ventricular  diastolic parameters are consistent with Grade I diastolic dysfunction  (impaired relaxation).   Right Ventricle: The right ventricular size is not well visualized. No  increase in right ventricular wall thickness. Right ventricular systolic  function is normal.   Left Atrium: Left atrial size was normal in size.   Right Atrium: Right atrial size was normal in size.   Pericardium: There is no evidence of pericardial effusion.   Mitral Valve: The mitral valve is normal in structure. Mild mitral valve  regurgitation.   Tricuspid Valve: The tricuspid valve is not well visualized. Tricuspid  valve regurgitation is not demonstrated.   Aortic Valve: The aortic valve was not well visualized. Aortic valve  regurgitation is not visualized. Aortic valve mean gradient measures 3.0  mmHg. Aortic valve peak gradient measures 6.0 mmHg. Aortic valve area, by  VTI measures 3.36 cm.   Pulmonic Valve: The pulmonic valve was not well visualized. Pulmonic valve  regurgitation is not visualized.    Aorta: The aortic root is normal in size and structure.   IAS/Shunts: No atrial level shunt detected by color flow Doppler.     LEFT VENTRICLE  PLAX 2D  LVIDd:         5.40 cm      Diastology  LVIDs:         4.50 cm      LV e' medial:   6.09 cm/s  LV PW:         1.10 cm      LV E/e' medial: 10.4  LV IVS:        1.20 cm  LVOT diam:     2.10 cm  LV SV:         55  LV SV Index:   26  LVOT Area:     3.46 cm    LV Volumes (MOD)  LV vol d, MOD A2C: 134.0 ml  LV vol d, MOD A4C: 151.0 ml  LV vol s, MOD A2C: 93.4 ml  LV vol s, MOD A4C: 114.0 ml  LV SV MOD A2C:     40.6 ml  LV SV MOD A4C:     151.0 ml  LV SV MOD   BP:      38.2 ml   RIGHT VENTRICLE  RV Basal diam:  2.10 cm  RV Mid diam:    1.60 cm  RV S prime:     11.70 cm/s  TAPSE (M-mode): 2.0 cm   LEFT ATRIUM             Index        RIGHT ATRIUM          Index  LA diam:        4.00 cm 1.90 cm/m   RA Area:     5.70 cm  LA Vol (A2C):   53.0 ml 25.17 ml/m  RA Volume:   6.91 ml  3.28 ml/m  LA Vol (A4C):   29.9 ml 14.20 ml/m  LA Biplane Vol: 39.7 ml 18.85 ml/m   AORTIC VALVE  AV Area (Vmax):    2.45 cm  AV Area (Vmean):   2.63 cm  AV Area (VTI):     3.36 cm  AV Vmax:           122.00 cm/s  AV Vmean:          80.000 cm/s  AV VTI:            0.164 m  AV Peak Grad:      6.0 mmHg  AV Mean Grad:      3.0 mmHg  LVOT Vmax:         86.20 cm/s  LVOT Vmean:        60.800 cm/s  LVOT VTI:          0.159 m  LVOT/AV VTI ratio: 0.97    AORTA  Ao Root diam: 3.20 cm   MITRAL VALVE               TRICUSPID VALVE  MV Area (PHT): 6.83 cm    TR Peak grad:   11.6 mmHg  MV Decel Time: 111 msec    TR Vmax:        170.00 cm/s  MV E velocity: 63.40 cm/s  MV A velocity: 96.90 cm/s  SHUNTS  MV E/A ratio:  0.65        Systemic VTI:  0.16 m                             Systemic Diam: 2.10 cm   Brian Agbor-Etang MD  Electronically signed by Brian Agbor-Etang MD  Signature Date/Time: 09/01/2022/4:32:46 PM        Final       Assessment/Plan:  This 37-year-old woman has high-grade ostial LAD stenosis presenting with a 3-week history of recurrent chest discomfort and NSTEMI.  Echo shows a reduced ejection fraction of 30 to 35% with global hypokinesis.  I agree that coronary bypass graft surgery is indicated for relief of her symptoms and to prevent further myocardial loss.  This will require a LIMA graft to the LAD. I discussed the operative procedure with the patient  including alternatives, benefits and risks; including but not limited to bleeding, blood transfusion, infection, stroke, myocardial infarction, graft failure, heart block requiring a permanent pacemaker, organ dysfunction, and death.  Kimiko Eagles understands and agrees to proceed.  We will schedule surgery for tomorrow morning.  Bryne Lindon K Katniss Weedman 09/01/2022, 8:11 PM      

## 2022-09-01 NOTE — ED Notes (Signed)
Pt is very irate and explains her frustrations to be with how loud the ER is and how often she is being interrupted. Pt informed that there are other pts in ER so it is hard to control noise levels and pt is only being "interrupted" when things are necessary for her care. Pt expresses frustrations with multiple lab draws, monitors beeping, EVS cleaning her trash and [place] giving her medications. Pt again informed that all this is for her benefit of care. MD Allena Katz, Charge RN Morrie Sheldon, student RN Thana Ates and this RN at bedside. Pt yelling and states that she was not happy being in a hallway previously pt informed that the ER has been very busy and at the time that was all that is available, pt currently in rm 24. Pt also states that she is frustrated we do not have any rooms on the floor for her to go to at this time, pt informed that all the same care will happen in the ER that would happen if she was on a floor. Pt states she will find her own cardiologist to come in and see her since ours will not be here until the morning, pt states she will also find a way for her to be transferred, pt informed that she will not be transferred at this time because the care she requires can all be done here, pt informed that if she wishes to be seen elsewhere she will need to sign an AMA form indicating she is leaving against medical advice. Pt continues to say she will find her own way. Throughout this conversation pt yelling and cussing at staff.

## 2022-09-01 NOTE — Progress Notes (Signed)
   09/01/22 2153  Spiritual Encounters  Type of Visit Initial  Care provided to: Patient  Referral source Nurse (RN/NT/LPN)  Reason for visit Advance directives  OnCall Visit Yes  Advance Directives (For Healthcare)  Does Patient Have a Medical Advance Directive? No  Would patient like information on creating a medical advance directive? Yes (Inpatient - patient requests chaplain consult to create a medical advance directive)   Chaplain rec'd message stating that pt would like to speak with the chaplain. Upon entering the unit chaplain spoke with pt's RN who noted that pt is having AM surgery and wants to update POA. Chaplain introduced self and role to pt. Chaplain explained the [employer] paperwork, but noted that final signing would have to wait until witnesses could be provided. Chaplain will call to speak with pt's nurse after shift change in order to learn what time pt's surgery is scheduled for.

## 2022-09-01 NOTE — H&P (View-Only) (Signed)
Cardiology Consultation   Patient ID: Elizabeth Spencer MRN: [id number]; DOB: 1985-10-02  Admit date: 08/31/2022 Date of Consult: 09/01/2022  PCP:  Miki Kins, FNP   [facility] Providers Cardiologist:  New  Patient Profile:   Elizabeth Spencer is a 37 y.o. female with a hx of bipolar affective disorder, GERD, migraines, chronic low back pain, B1 def due to diet, prediabetes, mixed HLD, Vit D def, h/o drug use, tobacco use who is being seen 09/01/2022 for the evaluation of chest pain at the request of Dr. Lucianne Muss.  History of Present Illness:   [name] has not been seen by cardiology in the past. She reports family history of father with heart issues (died last year). She reports history of drug use. She has been drinking ?Mycards weekly (85 alcohol each drink). She reports she was recently diagnosed with Factor 5 Leiden and was going to be started on a blood thinner. Reports no recent drug use. She smokes. Reports no chance she could be pregnant.   Patient presented to the ER on 08/31/2022 with chest pain.  She reported chest pain since Mothers day.  It is located in the center part of her chest nd has been intermittent for the last few weeks.  Said episodes were lasting up to 30 minutes and slowly got worse.  Also reports associated nausea and vomiting. Said she had been vomiting for the last  days. She denies fever or LLE.   In the ER blood pressure 99/88, pulse rate 123 bpm, respiratory rate 20, afebrile, 94% O2.  Labs showed high-sensitivity troponin 317, 576, 1031, 1163.  BMET showed Scr 0.6, BUN 7, K3.5, WBC .8.8, Hgb 13.7. Normal LFTs. Lipase 30. Chest x-ray was nonacute.  Chest CT showed no PE, + cholelithiasis.  Ultrasound right upper quadrant with no definite findings of acute cholecystitis. UDS +cocaine. The patient was started on IV heparin and admitted for further work-up.  Past Medical History:  Diagnosis Date   Aneurysm (HCC) 01/04/2014   Arthritis    Asthma     WELL CONTROLLED   Brain aneurysm 2007   NEUROLOGY NOTE DOES NOT MENTION ANEURYSM BUT PT STATES SHE DID NOT HAVE TO HAVE SURGERY   Complication of anesthesia    FOR 1 C-SECTION PT WAS ITCHING AND VERY RED ON HER FACE   Cough, persistent 01/27/2016   Dermatitis due to sunburn 11/08/2013   Family history of adverse reaction to anesthesia    brother, neice and nephew got red in face with hives   Gallstones    GERD (gastroesophageal reflux disease)    Gonorrhea 06/20/2020   Headache    CHRONIC HEADACHES   History of chronic cough    DRY   Loss of memory 05/30/2017   Perforation of left tympanic membrane 05/17/2013   Pneumonia    PTSD (post-traumatic stress disorder)    PTSD (post-traumatic stress disorder)    Thyroid condition    PT WAS JUST TOLD ON 08-25-17 THAT SHE HAS A THYROID PROBLEM AND IS GOING TO F/U WITH ENDOCRINOLOGIST IN 2 WEEKS   Trichomonas vaginalis (TV) infection 06/20/2020    Past Surgical History:  Procedure Laterality Date   ABDOMINAL HYSTERECTOMY     CESAREAN SECTION     x3   CYSTOSCOPY N/A 09/29/2017   Procedure: CYSTOSCOPY;  Surgeon: Vena Austria, MD;  Location: [facility] ORS;  Service: Gynecology;  Laterality: N/A;   CYSTOSCOPY N/A 11/05/2017   Procedure: CYSTOSCOPY;  Surgeon: Vena Austria, MD;  Location: [facility]  ORS;  Service: Gynecology;  Laterality: N/A;   HYSTEROSCOPY WITH D & C N/A 09/01/2017   Procedure: DILATATION AND CURETTAGE /HYSTEROSCOPY;  Surgeon: Vena Austria, MD;  Location: [facility] ORS;  Service: Gynecology;  Laterality: N/A;   LAPAROSCOPY N/A 09/01/2017   Procedure: LAPAROSCOPY OPERATIVE with biopsy;  Surgeon: Vena Austria, MD;  Location: [facility] ORS;  Service: Gynecology;  Laterality: N/A;   LAPAROSCOPY N/A 11/05/2017   Procedure: LAPAROSCOPY DIAGNOSTIC;  Surgeon: Vena Austria, MD;  Location: [facility] ORS;  Service: Gynecology;  Laterality: N/A;   REPAIR VAGINAL CUFF N/A 11/05/2017   Procedure: REPAIR VAGINAL CUFF;  Surgeon: Vena Austria,  MD;  Location: [facility] ORS;  Service: Gynecology;  Laterality: N/A;   TOTAL LAPAROSCOPIC HYSTERECTOMY WITH SALPINGECTOMY Bilateral 09/29/2017   Procedure: TOTAL LAPAROSCOPIC HYSTERECTOMY WITH SALPINGECTOMY;  Surgeon: Vena Austria, MD;  Location: [facility] ORS;  Service: Gynecology;  Laterality: Bilateral;     Home Medications:  Prior to Admission medications   Medication Sig Start Date End Date Taking? Authorizing Provider  methimazole (TAPAZOLE) 5 MG tablet Take by mouth. 08/31/18  Yes [provider]  naproxen (NAPROSYN) 500 MG tablet Take 500 mg by mouth 2 (two) times daily as needed for moderate pain or headache. 11/12/20  Yes [provider]  omeprazole (PRILOSEC) 20 MG capsule Take 1 capsule (20 mg total) by mouth 2 (two) times daily before a meal. 06/17/22  Yes Miki Kins, FNP  albuterol (VENTOLIN HFA) 108 (90 Base) MCG/ACT inhaler Inhale 2 puffs into the lungs every 6 (six) hours as needed for wheezing or shortness of breath. 06/17/22   Miki Kins, FNP  cetirizine (ZYRTEC) 10 MG tablet Take 1 tablet (10 mg total) by mouth daily. 06/17/22   Miki Kins, FNP  EPIPEN 2-PAK 0.3 MG/0.3ML SOAJ injection INJECT INTO THIGH MUSCLE THROUGH CLOTHES AS NEEDED SEVERE ALLERGIC REACTION 06/17/22   Miki Kins, FNP  fluconazole (DIFLUCAN) 150 MG tablet Take 150 mg by mouth every 3 (three) days. 01/23/20   [provider]  fluticasone (FLONASE) 50 MCG/ACT nasal spray 1 spray by Each Nare route daily. 06/17/22   Miki Kins, FNP  hydrOXYzine (ATARAX/VISTARIL) 25 MG tablet Take 25 mg by mouth 3 (three) times daily. 01/02/20   [provider]  montelukast (SINGULAIR) 10 MG tablet Take 1 tablet (10 mg total) by mouth daily. 06/17/22   Miki Kins, FNP  Multiple Vitamins-Minerals (CENTRUM ADULTS PO) Take 1 tablet by mouth at bedtime.     [provider]  ondansetron (ZOFRAN ODT) 4 MG disintegrating tablet Take 1 tablet (4 mg total) by mouth  every 8 (eight) hours as needed for nausea or vomiting. 03/13/20   Tommie Sams, DO  phentermine (ADIPEX-P) 37.5 MG tablet Take 1 tablet (37.5 mg total) by mouth daily before breakfast. 06/17/22   Miki Kins, FNP    Inpatient Medications: Scheduled Meds:  aspirin  81 mg Oral Daily   atorvastatin  40 mg Oral Daily   methimazole  10 mg Oral Daily   montelukast  10 mg Oral Daily   pantoprazole  40 mg Oral BID AC   Continuous Infusions:  heparin 1,250 Units/hr (09/01/22 0321)   lactated ringers 20 mL/hr at 08/31/22 2227   PRN Meds: acetaminophen, albuterol, fluticasone, naproxen, nitroGLYCERIN, ondansetron (ZOFRAN) IV, ondansetron  Allergies:    Allergies  Allergen Reactions   Azithromycin Swelling and Other (See Comments)   Clindamycin Swelling   Codeine Hives and Other (See Comments)    Nausea/dizzy  Nausea/dizzy   Meloxicam Other (See Comments)    Burns up from the inside    Neurontin [Gabapentin] Other (See Comments)    Makes her pass out and not remember what happened prior to taking med.   Penicillins Anaphylaxis    Patient allergic to all "cillins" Has patient had a PCN reaction causing immediate rash, facial/tongue/throat swelling, SOB or lightheadedness with hypotension: Yes Has patient had a PCN reaction causing severe rash involving mucus membranes or skin necrosis: Yes Has patient had a PCN reaction that required hospitalization: No Has patient had a PCN reaction occurring within the last 10 years: Yes If all of the above answers are "NO", then may proceed with Cephalosporin use.    Clindamycin/Lincomycin Swelling   Other Hives and Other (See Comments)    Berry flavored food and drinks   Diphenhydramine Hcl Rash and Other (See Comments)   Doxycycline Rash    Social History:   Social History   Socioeconomic History   Marital status: Single    Spouse name: Not on file   Number of children: Not on file   Years of education: Not on file   Highest  education level: Not on file  Occupational History   Not on file  Tobacco Use   Smoking status: Every Day    Packs/day: 0.50    Years: 18.00    Additional pack years: 0.00    Total pack years: 9.00    Types: Cigarettes   Smokeless tobacco: Never  Vaping Use   Vaping Use: Never used  Substance and Sexual Activity   Alcohol use: Yes    Alcohol/week: 0.0 standard drinks of alcohol    Comment: occasionally   Drug use: Not Currently    Types: Marijuana   Sexual activity: Yes    Partners: Male    Birth control/protection: Surgical    Comment: Hysterectomy  Other Topics Concern   Not on file  Social History Narrative   Not on file   Social Determinants of Health   Financial Resource Strain: Not on file  Food Insecurity: Not on file  Transportation Needs: Not on file  Physical Activity: Not on file  Stress: Not on file  Social Connections: Not on file  Intimate Partner Violence: Not on file    Family History:    Family History  Problem Relation Age of Onset   Hypertension Mother    Alcohol abuse Mother    Deep vein thrombosis Mother    Alcohol abuse Father    COPD Father    Emphysema Father    Heart disease Father      ROS:  Please see the history of present illness.   All other ROS reviewed and negative.     Physical Exam/Data:   Vitals:   09/01/22 0315 09/01/22 0500 09/01/22 0615 09/01/22 0700  BP:   99/67   Pulse: (!) 111 (!) 111 (!) 106 (!) 101  Resp: (!) 22 13 12  (!) 22  Temp:      TempSrc:      SpO2: 96% 96% 98% 94%  Weight:      Height:       No intake or output data in the 24 hours ending 09/01/22 0801    08/31/2022    4:25 PM 07/15/2022    1:41 PM 06/17/2022    1:03 PM  Last 3 Weights  Weight (lbs) 220 lb 230 lb 6.4 oz 227 lb 3.2 oz  Weight (kg) 99.791 kg 104.509 kg 103.057  kg     Body mass index is 34.46 kg/m.  General:  Well nourished, well developed, in no acute distress HEENT: normal Neck: no JVD Vascular: No carotid bruits; Distal  pulses 2+ bilaterally Cardiac:  normal S1, S2; RRR; no murmur  Lungs:  clear to auscultation bilaterally, no wheezing, rhonchi or rales  Abd: soft, nontender, no hepatomegaly  Ext: no edema Musculoskeletal:  No deformities, BUE and BLE strength normal and equal Skin: warm and dry  Neuro:  CNs 2-12 intact, no focal abnormalities noted Psych:  Normal affect   EKG:  The EKG was personally reviewed and demonstrates:  ST 118bpm, TWI aVL Telemetry:  Telemetry was personally reviewed and demonstrates: SR/ST 100-120s   Relevant CV Studies:  Echo ordered  Laboratory Data:  High Sensitivity Troponin:   Recent Labs  Lab 08/31/22 1633 08/31/22 1850 08/31/22 2200 09/01/22 0006  TROPONINIHS 317* 576* 1,031* 1,163*     Chemistry Recent Labs  Lab 08/31/22 1633 09/01/22 0202  NA 139 138  K 3.5 3.2*  CL 108 108  CO2 24 25  GLUCOSE 109* 98  BUN 7 7  CREATININE 0.62 0.55  CALCIUM 8.9 8.3*  MG  --  1.9  GFRNONAA >60 >60  ANIONGAP 7 5    Recent Labs  Lab 08/31/22 1633 09/01/22 0202  PROT 6.5 6.0*  ALBUMIN 3.5 3.4*  AST 38 40  ALT 36 34  ALKPHOS 71 64  BILITOT 0.4 0.5   Lipids  Recent Labs  Lab 08/31/22 2200  CHOL 135  TRIG 110  HDL 46  LDLCALC 67  CHOLHDL 2.9    Hematology Recent Labs  Lab 08/31/22 1633 09/01/22 0202  WBC 8.8 8.4  RBC 4.83 4.23  HGB 13.7 12.4  HCT 43.3 38.5  MCV 89.6 91.0  MCH 28.4 29.3  MCHC 31.6 32.2  RDW 12.8 12.8  PLT 287 237   Thyroid  Recent Labs  Lab 08/31/22 2200  TSH <0.010*  FREET4 1.74*    BNPNo results for input(s): "BNP", "PROBNP" in the last 168 hours.  DDimer No results for input(s): "DDIMER" in the last 168 hours.   Radiology/Studies:  US Abdomen Limited RUQ (LIVER/GB)  Result Date: 08/31/2022 CLINICAL DATA:  [id number] Vomiting [id number] EXAM: ULTRASOUND ABDOMEN LIMITED RIGHT UPPER QUADRANT COMPARISON:  CT angiography chest 08/31/2022 FINDINGS: Gallbladder: Multiple calcified gallstones within the gallbladder lumen.  Associated gallbladder wall thickening of 4 mm. No pericholecystic fluid. No sonographic Murphy sign noted by sonographer. Common bile duct: Diameter: 3 mm Liver: No focal lesion identified. Increased parenchymal echogenicity. Portal vein is patent on color Doppler imaging with normal direction of blood flow towards the liver. Other: None. IMPRESSION: 1. Cholelithiasis with nonspecific mild gallbladder wall thickening. Negative sonographic Murphy sign and no pericholecystic fluid. No definite findings of acute cholecystitis. 2. Hepatic steatosis. Please note limited evaluation for focal hepatic masses in a patient with hepatic steatosis due to decreased penetration of the acoustic ultrasound waves. Electronically Signed   By: Tish Frederickson M.D.   On: 08/31/2022 21:34   CT Angio Chest PE W and/or Wo Contrast  Result Date: 08/31/2022 CLINICAL DATA:  Chest pain EXAM: CT ANGIOGRAPHY CHEST WITH CONTRAST TECHNIQUE: Multidetector CT imaging of the chest was performed using the standard protocol during bolus administration of intravenous contrast. Multiplanar CT image reconstructions and MIPs were obtained to evaluate the vascular anatomy. RADIATION DOSE REDUCTION: This exam was performed according to the departmental dose-optimization program which includes automated exposure control, adjustment of the mA and/or  kV according to patient size and/or use of iterative reconstruction technique. CONTRAST:  75mL OMNIPAQUE IOHEXOL 350 MG/ML SOLN COMPARISON:  None Available. FINDINGS: Cardiovascular: No filling defects in the pulmonary arteries to suggest pulmonary emboli. Heart is normal size. Aorta is normal caliber. Mediastinum/Nodes: No mediastinal, hilar, or axillary adenopathy. Trachea and esophagus are unremarkable. Thyroid unremarkable. Lungs/Pleura: Lungs are clear. No focal airspace opacities or effusions. 7 mm nodule in the right lower lobe at the right lung base posteriorly on image 118 of series 5. Upper Abdomen:  Multiple gallstones within the gallbladder. No acute findings. Musculoskeletal: Chest wall soft tissues are unremarkable. No acute bony abnormality. Review of the MIP images confirms the above findings. IMPRESSION: No evidence of pulmonary embolus. 7 mm right lower lobe pulmonary nodule. Non-contrast chest CT at 6-12 months is recommended. If the nodule is stable at time of repeat CT, then future CT at 18-24 months (from today's scan) is considered optional for low-risk patients, but is recommended for high-risk patients. This recommendation follows the consensus statement: Guidelines for Management of Incidental Pulmonary Nodules Detected on CT Images: From the Fleischner Society 2017; Radiology 2017; 284:228-243. Cholelithiasis. Electronically Signed   By: Charlett Nose M.D.   On: 08/31/2022 19:17   DG Chest 2 View  Result Date: 08/31/2022 CLINICAL DATA:  Chest pain x2 weeks with vomiting. EXAM: CHEST - 2 VIEW COMPARISON:  September 18, 2018 FINDINGS: The heart size and mediastinal contours are within normal limits. Low lung volumes are noted. Both lungs are clear. The visualized skeletal structures are unremarkable. IMPRESSION: No active cardiopulmonary disease. Electronically Signed   By: Aram Candela M.D.   On: 08/31/2022 17:04     Assessment and Plan:   NSTEMI - presented with progressive chest pain with associated N/V for the last few weeks - HS trop elevated >1000, now down trending. EKG with nonspecific changes - continue IV heparin - she is NPO - reports chest pain 7/10 - start ASA 81mg  daily. Continue Lipitor. Caution with BB and cocaine use, may need to change to Coreg - check an echo - plan for LHC today Risks and benefits of cardiac catheterization have been discussed with the patient.  These include bleeding, infection, kidney damage, stroke, heart attack, death.  The patient understands these risks and is willing to proceed.   RUQ pain - RUQ US showed gallstones with no definitive  acute cholecystitis - per IM  Cocaine use - UDS + cocaine - patient reports cocaine use about a month ago  Factor 5 Leiden - reports recent diagnosis  HLD - continue Lipitor 40mg  daily  For questions or updates, please contact [facility] Please consult [email or web address] for contact info under    Signed, Bryttany Tortorelli David Stall, PA-C  09/01/2022 8:01 AM

## 2022-09-01 NOTE — Discharge Summary (Signed)
[facility] Discharge Summary   Patient: Elizabeth Spencer RUE:[id number]  PCP: Miki Kins, FNP  Date of admission: 08/31/2022   Date of discharge:  09/01/2022     Discharge Diagnoses:  Principal Problem:   Chest pain Active Problems:   Anxiety   Multinodular goiter   NSTEMI (non-ST elevated myocardial infarction) [facility])   Admitted From: Home Disposition: Transfer to [facility] for CABG  CT surgery, accepting physician Dr. Bryan Lemma  Recommendations for Outpatient Follow-up:  PCP: in 1 wk CT surgery as per recommendation after discharge Follow up LABS/TEST:     Diet recommendation: Cardiac diet  Activity: The patient is advised to gradually reintroduce usual activities, as tolerated  Discharge Condition: stable  Code Status: Full code   History of present illness: As per the H and P dictated on admission Hospital Course:  Elizabeth Spencer is a 37 y.o. female with PMH of hyperthyroidism, asthma, PTSD, smokes cigarettes 1/4 pack/day, EtOH use every other day and recently used cocaine, presented at [facility] ED with complaining of chest pain started on Mother's Day.  Chest pain suddenly got worse, felt pressure like somebody was sitting on her chest and stabbing, 10/10 none radiating but worse with movement. ED workup: Tachycardic HR 92-123, blood pressure soft/fluctuating, saturating well on room air.  Hypokalemia potassium 3.2, CBC and lipid profile within normal range KGs show normal sinus rhythm sinus tachycardia with anterior infarct and nonspecific ST/T changes.  Patient was started on heparin IV infusion, cardiology was consulted.  [facility] hospitalist consulted for admission and further management as below.  Assessment and plan: # Non-STEMI Patient's chest pain improved after pain medication. Troponin trending down.  Monitor on telemetry Started aspirin 81 mg p.o. daily, nitroglycerin as needed S/p heparin IV infusion Cardiology consulted, s/p cardiac cath, severe  ostial LAD stenosis, likely an acute plaque rupture.  Cardiology recommended CABG, patient is being transferred to [facility] under Dr. Bryan Lemma, CABG will be done tomorrow a.m.  # Bacteremia, 2/4 blood culture growing gram-positive cocci, follow repeat blood cultures, vancomycin 1 dose given and pharmacy was consulted for dosing and trough monitoring. No source of infection, it could be a contamination.  WBC within normal range # Hypokalemia, potassium repleted.  Monitor electrolytes. #Hyperthyroidism, elevated free T4 level, patient ran out of methimazole for past 1 week.  Resumed methimazole, please refill methimazole on discharge. # RUQ pain:  RUQ US showed gallstones with no definitive acute cholecystitis.  Follow with general surgery as an outpatient. # Cocaine use, UDS + cocaine. patient reports cocaine use on Mother's Day.  Drug abuse abstinence counseling done. # Nicotine dependence, patient smokes 1/4 pack/day.  Smoking cessation counseling done. # Factor 5 Leiden, reports recent diagnosis.  Recommended to follow with PCP and hematologist as an outpatient, patient may need to be on DOAC. # HLD, continue Lipitor 40mg  daily   Body mass index is 34.46 kg/m.  Nutrition Interventions:   On the day of the discharge the patient's vitals were stable, and no other acute medical condition were reported by patient. the patient was felt safe to be discharge at [facility] for CABG tomorrow a.m., accepting physician Dr. Bryan Lemma.  Consultants: Cardiologist Procedures: Cardiac cath: severe ostial LAD stenosis, likely an acute plaque rupture.  Discharge Exam: General: Appear in no distress, no Rash; Oral Mucosa Clear, moist. Cardiovascular: S1 and S2 Present, no Murmur, Respiratory: normal respiratory effort, Bilateral Air entry present and no Crackles, no wheezes Abdomen: Bowel Sound present, Soft and no  tenderness, no hernia Extremities: no Pedal edema, no calf tenderness Neurology:  alert and oriented to time, place, and person affect appropriate.  Filed Weights   08/31/22 1625 09/01/22 1108  Weight: 99.8 kg 99.8 kg   Vitals:   09/01/22 1315 09/01/22 1330  BP: 102/77 97/65  Pulse: 96 98  Resp: 17 (!) 22  Temp:    SpO2: (!) 83% 96%    DISCHARGE MEDICATION: Allergies as of 09/01/2022       Reactions   Azithromycin Swelling, Other (See Comments)   Clindamycin Swelling   Codeine Hives, Other (See Comments)   Nausea/dizzy Nausea/dizzy   Meloxicam Other (See Comments)   Burns up from the inside   Neurontin [gabapentin] Other (See Comments)   Makes her pass out and not remember what happened prior to taking med.   Penicillins Anaphylaxis   Patient allergic to all "cillins" Has patient had a PCN reaction causing immediate rash, facial/tongue/throat swelling, SOB or lightheadedness with hypotension: Yes Has patient had a PCN reaction causing severe rash involving mucus membranes or skin necrosis: Yes Has patient had a PCN reaction that required hospitalization: No Has patient had a PCN reaction occurring within the last 10 years: Yes If all of the above answers are "NO", then may proceed with Cephalosporin use.   Clindamycin/lincomycin Swelling   Other Hives, Other (See Comments)   Berry flavored food and drinks   Diphenhydramine Hcl Rash, Other (See Comments)   Doxycycline Rash        Medication List     STOP taking these medications    fluconazole 150 MG tablet Commonly known as: DIFLUCAN   hydrOXYzine 25 MG tablet Commonly known as: ATARAX   naproxen 500 MG tablet Commonly known as: NAPROSYN       TAKE these medications    albuterol 108 (90 Base) MCG/ACT inhaler Commonly known as: VENTOLIN HFA Inhale 2 puffs into the lungs every 6 (six) hours as needed for wheezing or shortness of breath.   aspirin 81 MG chewable tablet Chew 1 tablet (81 mg total) by mouth daily. Start taking on: Sep 02, 2022   atorvastatin 40 MG tablet Commonly  known as: LIPITOR Take 1 tablet (40 mg total) by mouth daily. Start taking on: Sep 02, 2022   CENTRUM ADULTS PO Take 1 tablet by mouth at bedtime.   cetirizine 10 MG tablet Commonly known as: ZYRTEC Take 1 tablet (10 mg total) by mouth daily.   EpiPen 2-Pak 0.3 mg/0.3 mL Soaj injection Generic drug: EPINEPHrine INJECT INTO THIGH MUSCLE THROUGH CLOTHES AS NEEDED SEVERE ALLERGIC REACTION   fluticasone 50 MCG/ACT nasal spray Commonly known as: FLONASE 1 spray by Each Nare route daily.   methimazole 5 MG tablet Commonly known as: TAPAZOLE Take by mouth.   metoprolol tartrate 25 MG tablet Commonly known as: LOPRESSOR Take 1 tablet (25 mg total) by mouth 2 (two) times daily.   montelukast 10 MG tablet Commonly known as: SINGULAIR Take 1 tablet (10 mg total) by mouth daily.   nitroGLYCERIN 0.4 MG SL tablet Commonly known as: NITROSTAT Place 1 tablet (0.4 mg total) under the tongue every 5 (five) minutes as needed for chest pain.   omeprazole 20 MG capsule Commonly known as: PRILOSEC Take 1 capsule (20 mg total) by mouth 2 (two) times daily before a meal.   phentermine 37.5 MG tablet Commonly known as: ADIPEX-P Take 1 tablet (37.5 mg total) by mouth daily before breakfast.       Allergies  Allergen  Reactions   Azithromycin Swelling and Other (See Comments)   Clindamycin Swelling   Codeine Hives and Other (See Comments)    Nausea/dizzy Nausea/dizzy   Meloxicam Other (See Comments)    Burns up from the inside    Neurontin [Gabapentin] Other (See Comments)    Makes her pass out and not remember what happened prior to taking med.   Penicillins Anaphylaxis    Patient allergic to all "cillins" Has patient had a PCN reaction causing immediate rash, facial/tongue/throat swelling, SOB or lightheadedness with hypotension: Yes Has patient had a PCN reaction causing severe rash involving mucus membranes or skin necrosis: Yes Has patient had a PCN reaction that required  hospitalization: No Has patient had a PCN reaction occurring within the last 10 years: Yes If all of the above answers are "NO", then may proceed with Cephalosporin use.    Clindamycin/Lincomycin Swelling   Other Hives and Other (See Comments)    Berry flavored food and drinks   Diphenhydramine Hcl Rash and Other (See Comments)   Doxycycline Rash   Discharge Instructions     AMB referral to Phase II Cardiac Rehabilitation   Complete by: As directed    Diagnosis: NSTEMI   After initial evaluation and assessments completed: Virtual Based Care may be provided alone or in conjunction with Phase 2 Cardiac Rehab based on patient barriers.: Yes   Intensive Cardiac Rehabilitation (ICR) [facility] location only OR Traditional Cardiac Rehabilitation (TCR) *If criteria for ICR are not met will enroll in TCR [facility] only): Yes   Call MD for:  difficulty breathing, headache or visual disturbances   Complete by: As directed    Call MD for:  persistant nausea and vomiting   Complete by: As directed    Call MD for:  severe uncontrolled pain   Complete by: As directed    Diet - low sodium heart healthy   Complete by: As directed    Discharge instructions   Complete by: As directed    Follow instructions after discharge from [facility]   Increase activity slowly   Complete by: As directed        The results of significant diagnostics from this hospitalization (including imaging, microbiology, ancillary and laboratory) are listed below for reference.    Significant Diagnostic Studies: CARDIAC CATHETERIZATION  Result Date: 09/01/2022 Conclusions: Severe single-vessel coronary artery disease, with hazy and eccentric 90% ostial LAD stenosis concerning for acute plaque rupture.  Tubular 40% proximal RCA stenosis is also present.  Large LCx without significant disease. Moderately to severely reduced left ventricular systolic function (LVEF 30-35%) with anterior wall hypokinesis/akinesis.  LVEF 30-35%.  Moderately elevated left ventricular filling pressure (LVEDP 25 mmHg). Recommendations: Critical ostial LAD disease not well-suited for PCI given risk for compromise of LCx and D1.  Images reviewed with interventional cardiology and cardiac surgery teams at [facility]; we will arrange for transfer to [facility] with plans for CABG tomorrow. Restart heparin infusion 2 hours after TR band removal. Titrate NTG infusion for relief of chest pain.  IABP deferred due to patient's inability to lie flat and remain still. Aggressive secondary prevention of coronary artery disease. Obtain echocardiogram. Yvonne Kendall, MD [facility]  US Abdomen Limited RUQ (LIVER/GB)  Result Date: 08/31/2022 CLINICAL DATA:  [id number] Vomiting [id number] EXAM: ULTRASOUND ABDOMEN LIMITED RIGHT UPPER QUADRANT COMPARISON:  CT angiography chest 08/31/2022 FINDINGS: Gallbladder: Multiple calcified gallstones within the gallbladder lumen. Associated gallbladder wall thickening of 4 mm. No pericholecystic fluid. No sonographic Murphy sign noted  by sonographer. Common bile duct: Diameter: 3 mm Liver: No focal lesion identified. Increased parenchymal echogenicity. Portal vein is patent on color Doppler imaging with normal direction of blood flow towards the liver. Other: None. IMPRESSION: 1. Cholelithiasis with nonspecific mild gallbladder wall thickening. Negative sonographic Murphy sign and no pericholecystic fluid. No definite findings of acute cholecystitis. 2. Hepatic steatosis. Please note limited evaluation for focal hepatic masses in a patient with hepatic steatosis due to decreased penetration of the acoustic ultrasound waves. Electronically Signed   By: Tish Frederickson M.D.   On: 08/31/2022 21:34   CT Angio Chest PE W and/or Wo Contrast  Result Date: 08/31/2022 CLINICAL DATA:  Chest pain EXAM: CT ANGIOGRAPHY CHEST WITH CONTRAST TECHNIQUE: Multidetector CT imaging of the chest was performed using the standard protocol during bolus  administration of intravenous contrast. Multiplanar CT image reconstructions and MIPs were obtained to evaluate the vascular anatomy. RADIATION DOSE REDUCTION: This exam was performed according to the departmental dose-optimization program which includes automated exposure control, adjustment of the mA and/or kV according to patient size and/or use of iterative reconstruction technique. CONTRAST:  75mL OMNIPAQUE IOHEXOL 350 MG/ML SOLN COMPARISON:  None Available. FINDINGS: Cardiovascular: No filling defects in the pulmonary arteries to suggest pulmonary emboli. Heart is normal size. Aorta is normal caliber. Mediastinum/Nodes: No mediastinal, hilar, or axillary adenopathy. Trachea and esophagus are unremarkable. Thyroid unremarkable. Lungs/Pleura: Lungs are clear. No focal airspace opacities or effusions. 7 mm nodule in the right lower lobe at the right lung base posteriorly on image 118 of series 5. Upper Abdomen: Multiple gallstones within the gallbladder. No acute findings. Musculoskeletal: Chest wall soft tissues are unremarkable. No acute bony abnormality. Review of the MIP images confirms the above findings. IMPRESSION: No evidence of pulmonary embolus. 7 mm right lower lobe pulmonary nodule. Non-contrast chest CT at 6-12 months is recommended. If the nodule is stable at time of repeat CT, then future CT at 18-24 months (from today's scan) is considered optional for low-risk patients, but is recommended for high-risk patients. This recommendation follows the consensus statement: Guidelines for Management of Incidental Pulmonary Nodules Detected on CT Images: From the Fleischner Society 2017; Radiology 2017; 284:228-243. Cholelithiasis. Electronically Signed   By: Charlett Nose M.D.   On: 08/31/2022 19:17   DG Chest 2 View  Result Date: 08/31/2022 CLINICAL DATA:  Chest pain x2 weeks with vomiting. EXAM: CHEST - 2 VIEW COMPARISON:  September 18, 2018 FINDINGS: The heart size and mediastinal contours are within  normal limits. Low lung volumes are noted. Both lungs are clear. The visualized skeletal structures are unremarkable. IMPRESSION: No active cardiopulmonary disease. Electronically Signed   By: Aram Candela M.D.   On: 08/31/2022 17:04    Microbiology: Recent Results (from the past 240 hour(s))  Blood culture (routine x 2)     Status: None (Preliminary result)   Collection Time: 08/31/22  9:01 PM   Specimen: BLOOD  Result Value Ref Range Status   Specimen Description BLOOD BLOOD LEFT HAND [facility]  Final   Special Requests   Final    BOTTLES DRAWN AEROBIC AND ANAEROBIC Blood Culture adequate volume   Culture  Setup Time   Final    IN BOTH AEROBIC AND ANAEROBIC BOTTLES GRAM POSITIVE COCCI Organism ID to follow CRITICAL RESULT CALLED TO, READ BACK BY AND VERIFIED WITH: VERA MADUEMA 09/01/22 @ 1114 BY SB Performed at [facility], [address], [place], [place] [address]    Culture [facility] POSITIVE COCCI  Final  Report Status PENDING  Incomplete  Blood Culture ID Panel (Reflexed)     Status: Abnormal   Collection Time: 08/31/22  9:01 PM  Result Value Ref Range Status   Enterococcus faecalis NOT DETECTED NOT DETECTED Final   Enterococcus Faecium NOT DETECTED NOT DETECTED Final   Listeria monocytogenes NOT DETECTED NOT DETECTED Final   Staphylococcus species NOT DETECTED NOT DETECTED Final   Staphylococcus aureus (BCID) NOT DETECTED NOT DETECTED Final   Staphylococcus epidermidis NOT DETECTED NOT DETECTED Final   Staphylococcus lugdunensis NOT DETECTED NOT DETECTED Final   Streptococcus species DETECTED (A) NOT DETECTED Final    Comment: Not Enterococcus species, Streptococcus agalactiae, Streptococcus pyogenes, or Streptococcus pneumoniae. CRITICAL RESULT CALLED TO, READ BACK BY AND VERIFIED WITH: VERA MADUEME 09/01/22 @ 1114 BY SB    Streptococcus agalactiae NOT DETECTED NOT DETECTED Final   Streptococcus pneumoniae NOT DETECTED NOT DETECTED Final   Streptococcus pyogenes NOT  DETECTED NOT DETECTED Final   A.calcoaceticus-baumannii NOT DETECTED NOT DETECTED Final   Bacteroides fragilis NOT DETECTED NOT DETECTED Final   Enterobacterales NOT DETECTED NOT DETECTED Final   Enterobacter cloacae complex NOT DETECTED NOT DETECTED Final   Escherichia coli NOT DETECTED NOT DETECTED Final   Klebsiella aerogenes NOT DETECTED NOT DETECTED Final   Klebsiella oxytoca NOT DETECTED NOT DETECTED Final   Klebsiella pneumoniae NOT DETECTED NOT DETECTED Final   Proteus species NOT DETECTED NOT DETECTED Final   Salmonella species NOT DETECTED NOT DETECTED Final   Serratia marcescens NOT DETECTED NOT DETECTED Final   Haemophilus influenzae NOT DETECTED NOT DETECTED Final   Neisseria meningitidis NOT DETECTED NOT DETECTED Final   Pseudomonas aeruginosa NOT DETECTED NOT DETECTED Final   Stenotrophomonas maltophilia NOT DETECTED NOT DETECTED Final   Candida albicans NOT DETECTED NOT DETECTED Final   Candida auris NOT DETECTED NOT DETECTED Final   Candida glabrata NOT DETECTED NOT DETECTED Final   Candida krusei NOT DETECTED NOT DETECTED Final   Candida parapsilosis NOT DETECTED NOT DETECTED Final   Candida tropicalis NOT DETECTED NOT DETECTED Final   Cryptococcus neoformans/gattii NOT DETECTED NOT DETECTED Final    Comment: Performed at [facility], [address] [place], [place] [address]     Labs: CBC: Recent Labs  Lab 08/31/22 1633 09/01/22 0202  WBC 8.8 8.4  HGB 13.7 12.4  HCT 43.3 38.5  MCV 89.6 91.0  PLT 287 237   Basic Metabolic Panel: Recent Labs  Lab 08/31/22 1633 09/01/22 0202 09/01/22 0855  NA 139 138  --   K 3.5 3.2*  --   CL 108 108  --   CO2 24 25  --   GLUCOSE 109* 98  --   BUN 7 7  --   CREATININE 0.62 0.55  --   CALCIUM 8.9 8.3*  --   MG  --  1.9  --   PHOS  --   --  4.0   Liver Function Tests: Recent Labs  Lab 08/31/22 1633 09/01/22 0202  AST 38 40  ALT 36 34  ALKPHOS 71 64  BILITOT 0.4 0.5  PROT 6.5 6.0*  ALBUMIN 3.5  3.4*   Recent Labs  Lab 08/31/22 1633  LIPASE 30   No results for input(s): "AMMONIA" in the last 168 hours. Cardiac Enzymes: No results for input(s): "CKTOTAL", "CKMB", "CKMBINDEX", "TROPONINI" in the last 168 hours. BNP (last 3 results) No results for input(s): "BNP" in the last 8760 hours. CBG: No results for input(s): "GLUCAP" in the last 168  hours.  Time spent: 35 minutes  Signed:  Gillis Santa  [facility] 09/01/2022 1:34 PM

## 2022-09-01 NOTE — Interval H&P Note (Signed)
History and Physical Interval Note:  09/01/2022 11:09 AM  Elizabeth Spencer  has presented today for surgery, with the diagnosis of NSTEMI.  The various methods of treatment have been discussed with the patient and family. After consideration of risks, benefits and other options for treatment, the patient has consented to  Procedure(s): LEFT HEART CATH AND CORONARY ANGIOGRAPHY (N/A) as a surgical intervention.  The patient's history has been reviewed, patient examined, no change in status, stable for surgery.  I have reviewed the patient's chart and labs.  Questions were answered to the patient's satisfaction.    Cath Lab Visit (complete for each Cath Lab visit)  Clinical Evaluation Leading to the Procedure:   ACS: Yes.    Non-ACS:  N/A  Nazaire Cordial

## 2022-09-01 NOTE — ED Notes (Signed)
Patient reports increased chest pain that radiates into left arm; offered Nitro and Naproxen but patient denied both. Will contact Dr. Okey Dupre.

## 2022-09-01 NOTE — Progress Notes (Signed)
Report given to [place] and [place] RN at [facility] 2H.

## 2022-09-01 NOTE — Progress Notes (Signed)
Attempted to verify pt contact and next of kin information. Pt states, "I disowned my family. My ex husband, Nyoka Cowden, is my healthcare power of attorney. I wrote and signed paperwork at home in [place]." When asked if paperwork can be faxed to the hospital, pt unsure if ex husband can find paperwork. Chaplin consulted for Power of Research scientist (life sciences).

## 2022-09-01 NOTE — H&P (Addendum)
History and Physical    Patient: Elizabeth Spencer ZOX:[id number] DOB: [birth year] DOA: 08/31/2022 DOS: the patient was seen and examined on 09/01/2022 PCP: Miki Kins, FNP  Patient coming from: Home  Chief Complaint:  Chief Complaint  Patient presents with   Chest Pain    HPI: Elizabeth Spencer is a 37 y.o. female with medical history significant for chest pain since mothers day.. The patient reports that the chest pain has been in the middle of her chest like somebody sitting on her chest and is like a pressure that stabbing is 10 out of 10 it is nonradiating but is worse with movement and eating.  She also started having vomiting over the past few days. Patient is very irate, states" why my still down here if I am having a heart attack."  D/W pt that we will continue to monitor her troponin and based on third set we will know trend until then cont NSTEMI management.  Stated that the ultrasound tech" jabbed me my rib where it is hurting."  It is no surprise I have had gallstones for 15 years.  Patient does state that her chest pain is worse with moving and eating.  And she did have nausea and vomiting and the vomiting started about 3 days ago. Explained to the patient that we did the ultrasound as she was having right upper quadrant pain.  She states she knows that" she has been having right upper quadrant pain since the past few days as well." Pt in ed is awake, afebrile.Patient does not report any fevers or chills shortness of breath palpitations dizziness any GI or GU symptoms. Vitals in the ED emergency room showed tachycardia with blood pressures of 110/76 and O2 sats of 97% on room air. BMP shows normal glucose normal kidney function. Normal LFTs.  Normal CBC with differential. Patient had a CT angio chest PE protocol negative for pulmonary embolism but positive for 7 mm right lower lobe pulmonary nodule with recommendations for repeat CT noncontrast to follow resolution. Chest x-ray done  today was negative for any acute findings. Right upper quadrant ultrasound done shows cholelithiasis with mild gallbladder wall thickening negative sonographic Eulah Pont sign however patient complains of right upper quadrant pain, no pericholecystic fluid, no definitive active findings of acute cholecystitis. Hepatic steatosis noted. Rise in troponin as below:  08/31/22 16:33 08/31/22 18:50 08/31/22 22:00 09/01/22 00:06  Troponin I (High Sensitivity) 317 (HH) 576 (HH) 1,031 (HH) 1,163 (HH)  Lipid panel shows: Total cholesterol 135 HDL 46 LDL of 67 and triglycerides of 110. Urine drug screen positive for cocaine. TSH of less than 0.010 and free T4 of 1.74. EKG shows sinus tach at 126, PR interval of 156, QTc of 420,  T wave inversions and ST depression in V4 5 and 6.    EKG again shows sinus tach heart rate 118, QRS 88, QTc of 453, mild improvement in ST depression in lateral leads.     Review of Systems: Unable to review all systems due to lack of cooperation from patient.  Past Medical History:  Diagnosis Date   Aneurysm (HCC) 01/04/2014   Arthritis    Asthma    WELL CONTROLLED   Brain aneurysm 2007   NEUROLOGY NOTE DOES NOT MENTION ANEURYSM BUT PT STATES SHE DID NOT HAVE TO HAVE SURGERY   Complication of anesthesia    FOR 1 C-SECTION PT WAS ITCHING AND VERY RED ON HER FACE   Cough, persistent 01/27/2016   Dermatitis due to sunburn  11/08/2013   Family history of adverse reaction to anesthesia    brother, neice and nephew got red in face with hives   Gallstones    GERD (gastroesophageal reflux disease)    Gonorrhea 06/20/2020   Headache    CHRONIC HEADACHES   History of chronic cough    DRY   Loss of memory 05/30/2017   Perforation of left tympanic membrane 05/17/2013   Pneumonia    PTSD (post-traumatic stress disorder)    PTSD (post-traumatic stress disorder)    Thyroid condition    PT WAS JUST TOLD ON 08-25-17 THAT SHE HAS A THYROID PROBLEM AND IS GOING TO F/U WITH  ENDOCRINOLOGIST IN 2 WEEKS   Trichomonas vaginalis (TV) infection 06/20/2020   Past Surgical History:  Procedure Laterality Date   ABDOMINAL HYSTERECTOMY     CESAREAN SECTION     x3   CYSTOSCOPY N/A 09/29/2017   Procedure: CYSTOSCOPY;  Surgeon: Vena Austria, MD;  Location: [facility];  Service: Gynecology;  Laterality: N/A;   CYSTOSCOPY N/A 11/05/2017   Procedure: CYSTOSCOPY;  Surgeon: Vena Austria, MD;  Location: [facility];  Service: Gynecology;  Laterality: N/A;   HYSTEROSCOPY WITH D & C N/A 09/01/2017   Procedure: DILATATION AND CURETTAGE /HYSTEROSCOPY;  Surgeon: Vena Austria, MD;  Location: [facility];  Service: Gynecology;  Laterality: N/A;   LAPAROSCOPY N/A 09/01/2017   Procedure: LAPAROSCOPY OPERATIVE with biopsy;  Surgeon: Vena Austria, MD;  Location: [facility];  Service: Gynecology;  Laterality: N/A;   LAPAROSCOPY N/A 11/05/2017   Procedure: LAPAROSCOPY DIAGNOSTIC;  Surgeon: Vena Austria, MD;  Location: [facility];  Service: Gynecology;  Laterality: N/A;   REPAIR VAGINAL CUFF N/A 11/05/2017   Procedure: REPAIR VAGINAL CUFF;  Surgeon: Vena Austria, MD;  Location: [facility];  Service: Gynecology;  Laterality: N/A;   TOTAL LAPAROSCOPIC HYSTERECTOMY WITH SALPINGECTOMY Bilateral 09/29/2017   Procedure: TOTAL LAPAROSCOPIC HYSTERECTOMY WITH SALPINGECTOMY;  Surgeon: Vena Austria, MD;  Location: [facility];  Service: Gynecology;  Laterality: Bilateral;   Social History:  reports that she has been smoking cigarettes. She has a 9.00 pack-year smoking history. She has never used smokeless tobacco. She reports current alcohol use. She reports that she does not currently use drugs after having used the following drugs: Marijuana.  Allergies  Allergen Reactions   Azithromycin Swelling and Other (See Comments)   Clindamycin Swelling   Codeine Hives and Other (See Comments)    Nausea/dizzy Nausea/dizzy   Meloxicam Other (See Comments)    Burns up from the inside    Neurontin  [Gabapentin] Other (See Comments)    Makes her pass out and not remember what happened prior to taking med.   Penicillins Anaphylaxis    Patient allergic to all "cillins" Has patient had a PCN reaction causing immediate rash, facial/tongue/throat swelling, SOB or lightheadedness with hypotension: Yes Has patient had a PCN reaction causing severe rash involving mucus membranes or skin necrosis: Yes Has patient had a PCN reaction that required hospitalization: No Has patient had a PCN reaction occurring within the last 10 years: Yes If all of the above answers are "NO", then may proceed with Cephalosporin use.    Clindamycin/Lincomycin Swelling   Other Hives and Other (See Comments)    Berry flavored food and drinks   Diphenhydramine Hcl Rash and Other (See Comments)   Doxycycline Rash    Family History  Problem Relation Age of Onset   Hypertension Mother    Alcohol abuse Mother    Deep vein thrombosis Mother  Alcohol abuse Father    COPD Father    Emphysema Father    Heart disease Father     Prior to Admission medications   Medication Sig Start Date End Date Taking? Authorizing Provider  methimazole (TAPAZOLE) 5 MG tablet Take by mouth. 08/31/18  Yes [provider]  naproxen (NAPROSYN) 500 MG tablet Take 500 mg by mouth 2 (two) times daily as needed for moderate pain or headache. 11/12/20  Yes [provider]  omeprazole (PRILOSEC) 20 MG capsule Take 1 capsule (20 mg total) by mouth 2 (two) times daily before a meal. 06/17/22  Yes Miki Kins, FNP  albuterol (VENTOLIN HFA) 108 (90 Base) MCG/ACT inhaler Inhale 2 puffs into the lungs every 6 (six) hours as needed for wheezing or shortness of breath. 06/17/22   Miki Kins, FNP  cetirizine (ZYRTEC) 10 MG tablet Take 1 tablet (10 mg total) by mouth daily. 06/17/22   Miki Kins, FNP  EPIPEN 2-PAK 0.3 MG/0.3ML SOAJ injection INJECT INTO THIGH MUSCLE THROUGH CLOTHES AS NEEDED SEVERE ALLERGIC REACTION  06/17/22   Miki Kins, FNP  fluconazole (DIFLUCAN) 150 MG tablet Take 150 mg by mouth every 3 (three) days. 01/23/20   [provider]  fluticasone (FLONASE) 50 MCG/ACT nasal spray 1 spray by Each Nare route daily. 06/17/22   Miki Kins, FNP  hydrOXYzine (ATARAX/VISTARIL) 25 MG tablet Take 25 mg by mouth 3 (three) times daily. 01/02/20   [provider]  montelukast (SINGULAIR) 10 MG tablet Take 1 tablet (10 mg total) by mouth daily. 06/17/22   Miki Kins, FNP  Multiple Vitamins-Minerals (CENTRUM ADULTS PO) Take 1 tablet by mouth at bedtime.     [provider]  ondansetron (ZOFRAN ODT) 4 MG disintegrating tablet Take 1 tablet (4 mg total) by mouth every 8 (eight) hours as needed for nausea or vomiting. 03/13/20   Tommie Sams, DO  phentermine (ADIPEX-P) 37.5 MG tablet Take 1 tablet (37.5 mg total) by mouth daily before breakfast. 06/17/22   Miki Kins, FNP    Physical Exam: Vitals:   08/31/22 2302 08/31/22 2330 09/01/22 0015 09/01/22 0100  BP:  110/76    Pulse: (!) 111 (!) 111 (!) 117 (!) 109  Resp: 15 (!) 22 12 (!) 22  Temp:      TempSrc:      SpO2: 99% 97% 97% 100%  Weight:      Height:       Physical Exam Vitals reviewed.  Constitutional:      Appearance: She is obese. She is not ill-appearing.  HENT:     Head: Normocephalic and atraumatic.  Eyes:     Extraocular Movements: Extraocular movements intact.     Pupils: Pupils are equal, round, and reactive to light.  Cardiovascular:     Rate and Rhythm: Regular rhythm. Tachycardia present.     Pulses: Normal pulses.     Heart sounds: Normal heart sounds.  Pulmonary:     Effort: Pulmonary effort is normal.     Breath sounds: Normal breath sounds.  Abdominal:     General: Bowel sounds are normal.     Palpations: Abdomen is soft.  Musculoskeletal:     Right lower leg: No edema.     Left lower leg: No edema.  Skin:    General: Skin is warm.  Neurological:     General: No  focal deficit present.     Mental Status: She is alert and oriented to person,  place, and time.     Cranial Nerves: Cranial nerves 2-12 are intact.     Motor: Motor function is intact.  Psychiatric:        Attention and Perception: Attention normal.        Mood and Affect: Mood is anxious. Affect is angry.        Speech: Speech normal.        Behavior: Behavior is agitated.     Labs on Admission: I have personally reviewed following labs and imaging studies  CBC: Recent Labs  Lab 08/31/22 1633  WBC 8.8  HGB 13.7  HCT 43.3  MCV 89.6  PLT 287   Basic Metabolic Panel: Recent Labs  Lab 08/31/22 1633  NA 139  K 3.5  CL 108  CO2 24  GLUCOSE 109*  BUN 7  CREATININE 0.62  CALCIUM 8.9   GFR: Estimated Creatinine Clearance: 114.6 mL/min (by C-G formula based on SCr of 0.62 mg/dL). Liver Function Tests: Recent Labs  Lab 08/31/22 1633  AST 38  ALT 36  ALKPHOS 71  BILITOT 0.4  PROT 6.5  ALBUMIN 3.5   Recent Labs  Lab 08/31/22 1633  LIPASE 30   No results for input(s): "AMMONIA" in the last 168 hours. Coagulation Profile: Recent Labs  Lab 08/31/22 1850  INR 1.0   Cardiac Enzymes: No results for input(s): "CKTOTAL", "CKMB", "CKMBINDEX", "TROPONINI" in the last 168 hours. BNP (last 3 results) No results for input(s): "PROBNP" in the last 8760 hours. HbA1C: No results for input(s): "HGBA1C" in the last 72 hours. CBG: No results for input(s): "GLUCAP" in the last 168 hours. Lipid Profile: Recent Labs    08/31/22 2200  CHOL 135  HDL 46  LDLCALC 67  TRIG 110  CHOLHDL 2.9   Thyroid Function Tests: Recent Labs    08/31/22 2200  TSH <0.010*  FREET4 1.74*   Anemia Panel: No results for input(s): "VITAMINB12", "FOLATE", "FERRITIN", "TIBC", "IRON", "RETICCTPCT" in the last 72 hours. Urine analysis:    Component Value Date/Time   COLORURINE YELLOW 02/03/2020 1439   APPEARANCEUR CLEAR 02/03/2020 1439   APPEARANCEUR Clear 02/05/2012 2246   LABSPEC  1.015 02/03/2020 1439   LABSPEC 1.001 02/05/2012 2246   PHURINE 6.0 02/03/2020 1439   GLUCOSEU NEGATIVE 02/03/2020 1439   GLUCOSEU Negative 02/05/2012 2246   HGBUR NEGATIVE 02/03/2020 1439   BILIRUBINUR Negative 06/17/2022 1314   BILIRUBINUR Negative 02/05/2012 2246   KETONESUR NEGATIVE 02/03/2020 1439   PROTEINUR Negative 06/17/2022 1314   PROTEINUR NEGATIVE 02/03/2020 1439   UROBILINOGEN 0.2 06/17/2022 1314   NITRITE Negative 06/17/2022 1314   NITRITE NEGATIVE 02/03/2020 1439   LEUKOCYTESUR Negative 06/17/2022 1314   LEUKOCYTESUR NEGATIVE 02/03/2020 1439   LEUKOCYTESUR Trace 02/05/2012 2246    Radiological Exams on Admission: US Abdomen Limited RUQ (LIVER/GB)  Result Date: 08/31/2022 CLINICAL DATA:  [id number] Vomiting [id number] EXAM: ULTRASOUND ABDOMEN LIMITED RIGHT UPPER QUADRANT COMPARISON:  CT angiography chest 08/31/2022 FINDINGS: Gallbladder: Multiple calcified gallstones within the gallbladder lumen. Associated gallbladder wall thickening of 4 mm. No pericholecystic fluid. No sonographic Murphy sign noted by sonographer. Common bile duct: Diameter: 3 mm Liver: No focal lesion identified. Increased parenchymal echogenicity. Portal vein is patent on color Doppler imaging with normal direction of blood flow towards the liver. Other: None. IMPRESSION: 1. Cholelithiasis with nonspecific mild gallbladder wall thickening. Negative sonographic Murphy sign and no pericholecystic fluid. No definite findings of acute cholecystitis. 2. Hepatic steatosis. Please note limited evaluation for focal hepatic masses in a patient  with hepatic steatosis due to decreased penetration of the acoustic ultrasound waves. Electronically Signed   By: Tish Frederickson M.D.   On: 08/31/2022 21:34   CT Angio Chest PE W and/or Wo Contrast  Result Date: 08/31/2022 CLINICAL DATA:  Chest pain EXAM: CT ANGIOGRAPHY CHEST WITH CONTRAST TECHNIQUE: Multidetector CT imaging of the chest was performed using the standard protocol  during bolus administration of intravenous contrast. Multiplanar CT image reconstructions and MIPs were obtained to evaluate the vascular anatomy. RADIATION DOSE REDUCTION: This exam was performed according to the departmental dose-optimization program which includes automated exposure control, adjustment of the mA and/or kV according to patient size and/or use of iterative reconstruction technique. CONTRAST:  75mL OMNIPAQUE IOHEXOL 350 MG/ML SOLN COMPARISON:  None Available. FINDINGS: Cardiovascular: No filling defects in the pulmonary arteries to suggest pulmonary emboli. Heart is normal size. Aorta is normal caliber. Mediastinum/Nodes: No mediastinal, hilar, or axillary adenopathy. Trachea and esophagus are unremarkable. Thyroid unremarkable. Lungs/Pleura: Lungs are clear. No focal airspace opacities or effusions. 7 mm nodule in the right lower lobe at the right lung base posteriorly on image 118 of series 5. Upper Abdomen: Multiple gallstones within the gallbladder. No acute findings. Musculoskeletal: Chest wall soft tissues are unremarkable. No acute bony abnormality. Review of the MIP images confirms the above findings. IMPRESSION: No evidence of pulmonary embolus. 7 mm right lower lobe pulmonary nodule. Non-contrast chest CT at 6-12 months is recommended. If the nodule is stable at time of repeat CT, then future CT at 18-24 months (from today's scan) is considered optional for low-risk patients, but is recommended for high-risk patients. This recommendation follows the consensus statement: Guidelines for Management of Incidental Pulmonary Nodules Detected on CT Images: From the Fleischner Society 2017; Radiology 2017; 284:228-243. Cholelithiasis. Electronically Signed   By: Charlett Nose M.D.   On: 08/31/2022 19:17   DG Chest 2 View  Result Date: 08/31/2022 CLINICAL DATA:  Chest pain x2 weeks with vomiting. EXAM: CHEST - 2 VIEW COMPARISON:  September 18, 2018 FINDINGS: The heart size and mediastinal contours  are within normal limits. Low lung volumes are noted. Both lungs are clear. The visualized skeletal structures are unremarkable. IMPRESSION: No active cardiopulmonary disease. Electronically Signed   By: Aram Candela M.D.   On: 08/31/2022 17:04     Data Reviewed: Relevant notes from primary care and specialist visits, past discharge summaries as available in EHR, including Care Everywhere. Prior diagnostic testing as pertinent to current admission diagnoses Updated medications and problem lists for reconciliation ED course, including vitals, labs, imaging, treatment and response to treatment Triage notes, nursing and pharmacy notes and ED provider's notes Notable results as noted in HPI   Assessment and Plan: 37 year old obese female presenting with chest discomfort has been going on for about 2 weeks or so.  Patient also had reports of vomiting today EKG negative for any ST changes, workup in the emergency room negative for PE, cardiology consulted and patient started on heparin.  Troponin since initial collection has rose to 1100.  Will admit patient to cardiac telemetry unit with cardiology consult.  >> Chest pain/NSTEMI: Cocaine induced NSTEMI Diagnostics: - repeat troponin /  check lipids, A1c, thyroid function test.  Therapeutics: - NPO for cath in AM - ASA 324mg  then 81mg  daily - heparin drip for ACS per pharmacy protocol - atorvastatin 40mg  QHS - start ACE in AM as deemed appropriate - SLN, nitro gtt PRN - defer P2Y12 until after cath  >> Right upper quadrant pain:  Patient positive for gallstones. Will consider HIDA scan and or GEN surge consult as deemed appropriate once patient's NSTEMI is managed.  And patient has been seen by cardiology with further additional recommendations.   >> Cocaine abuse: Patient is irate and angry and yelling at me and the nurses.  Counseling up when patient is calm and settle down. Discussed with patient that cardiology will see her  tomorrow morning.  Patient states that she is going to get transferred.  Or have her doctor come in and see her here. Patient is currently settled in her room.   >> Multinodular goiter: Patient is currently on methimazole with dose increase. Patient to follow-up with her endocrinologist on regular basis.   >> Migraine: Patient states that she takes naproxen and omeprazole for her migraines patient offered morphine as she is on naproxen. Patient states that she does not like to take other medications that will  cause allergic reactions.   DVT prophylaxis: Heparin GGT  Consults: Dr. Graciela Husbands.  Advance Care Planning:   Code Status: Full Code   Family Communication: none  Disposition Plan: Back to previous home environment  Severity of Illness: The appropriate patient status for this patient is OBSERVATION. Observation status is judged to be reasonable and necessary in order to provide the required intensity of service to ensure the patient's safety. The patient's presenting symptoms, physical exam findings, and initial radiographic and laboratory data in the context of their medical condition is felt to place them at decreased risk for further clinical deterioration. Furthermore, it is anticipated that the patient will be medically stable for discharge from the hospital within 2 midnights of admission.   Author: Gertha Calkin, MD 09/01/2022 1:26 AM  For on call review [email or web address].

## 2022-09-01 NOTE — Consult Note (Signed)
ANTICOAGULATION CONSULT NOTE  Pharmacy Consult for heparin Indication: chest pain/ACS  Allergies  Allergen Reactions   Azithromycin Swelling and Other (See Comments)   Clindamycin Swelling   Codeine Hives and Other (See Comments)    Nausea/dizzy Nausea/dizzy   Meloxicam Other (See Comments)    Burns up from the inside    Neurontin [Gabapentin] Other (See Comments)    Makes her pass out and not remember what happened prior to taking med.   Penicillins Anaphylaxis    Patient allergic to all "cillins" Has patient had a PCN reaction causing immediate rash, facial/tongue/throat swelling, SOB or lightheadedness with hypotension: Yes Has patient had a PCN reaction causing severe rash involving mucus membranes or skin necrosis: Yes Has patient had a PCN reaction that required hospitalization: No Has patient had a PCN reaction occurring within the last 10 years: Yes If all of the above answers are "NO", then may proceed with Cephalosporin use.    Clindamycin/Lincomycin Swelling   Other Hives and Other (See Comments)    Berry flavored food and drinks   Diphenhydramine Hcl Rash and Other (See Comments)   Doxycycline Rash   Egg-Derived Products Rash    Patient Measurements: Height: 5\' 7"  (170.2 cm) Weight: 88 kg (194 lb 0.1 oz) IBW/kg (Calculated) : 61.6 Heparin Dosing Weight: 83.8  Vital Signs: Temp: 98.6 F (37 C) (05/29 1548) Temp Source: Oral (05/29 1108) BP: 97/70 (05/29 1730) Pulse Rate: 112 (05/29 1730)  Labs: Recent Labs    08/31/22 1633 08/31/22 1850 08/31/22 2200 09/01/22 0006 09/01/22 0202 09/01/22 0855 09/01/22 0857 09/01/22 1007  HGB 13.7  --   --   --  12.4  --   --   --   HCT 43.3  --   --   --  38.5  --   --   --   PLT 287  --   --   --  237  --   --   --   APTT  --  27  --   --   --   --   --   --   LABPROT  --  13.2  --   --   --   --   --   --   INR  --  1.0  --   --   --   --   --   --   HEPARINUNFRC  --   --   --   --  <0.10*  --  <0.10*  --    CREATININE 0.62  --   --   --  0.55  --   --   --   TROPONINIHS 317* 576*   < > 1,163*  --  765*  --  687*   < > = values in this interval not displayed.     Estimated Creatinine Clearance: 107.6 mL/min (by C-G formula based on SCr of 0.55 mg/dL).   Medical History: Past Medical History:  Diagnosis Date   Aneurysm (HCC) 01/04/2014   Arthritis    Asthma    WELL CONTROLLED   Brain aneurysm 2007   NEUROLOGY NOTE DOES NOT MENTION ANEURYSM BUT PT STATES SHE DID NOT HAVE TO HAVE SURGERY   Complication of anesthesia    FOR 1 C-SECTION PT WAS ITCHING AND VERY RED ON HER FACE   Cough, persistent 01/27/2016   Dermatitis due to sunburn 11/08/2013   Family history of adverse reaction to anesthesia    brother, neice and nephew got  red in face with hives   Gallstones    GERD (gastroesophageal reflux disease)    Gonorrhea 06/20/2020   Headache    CHRONIC HEADACHES   History of chronic cough    DRY   Loss of memory 05/30/2017   Perforation of left tympanic membrane 05/17/2013   Pneumonia    PTSD (post-traumatic stress disorder)    PTSD (post-traumatic stress disorder)    Thyroid condition    PT WAS JUST TOLD ON 08-25-17 THAT SHE HAS A THYROID PROBLEM AND IS GOING TO F/U WITH ENDOCRINOLOGIST IN 2 WEEKS   Trichomonas vaginalis (TV) infection 06/20/2020    Medications:  No chronic anticoagulation PTA from chart review  Assessment: 37 y.o. female with PMH MDD/GAD, GERD, migraines, bipolar disorder who presents with chest pain and emesis. Troponins trending up, from 317 >> 576 S/p cath lab with LAD disease planning CABG 5/30 am   Pharmacy has been consulted to resume heparin drip after TR band removed  Will resume at lower rate to prevent bleeding complication post procedure and planning OR in am   Goal of Therapy:  Heparin level 0.3-0.7 units/ml Monitor platelets by anticoagulation protocol: Yes      Plan:  Restart heparin drip no bolus  1000 uts/hr  Stop on way to OR in am   Follow post op    Leota Sauers Pharm.D. CPP, BCPS Clinical Pharmacist [phone] 09/01/2022 6:15 PM

## 2022-09-01 NOTE — ED Notes (Signed)
Pt refusing to be stuck for blood culture collection.

## 2022-09-01 NOTE — ED Notes (Signed)
All belongings including both cell phones and cell phone charger sent with patient to cath lab

## 2022-09-01 NOTE — H&P (Signed)
Cardiology Admission History and Physical   Patient ID: Elizabeth Spencer MRN: [id number]; DOB: [birth year]   Admission date: (Not on file)  PCP:  Miki Kins, FNP   [facility] Providers Cardiologist: New-Dr. End  Chief Complaint:  Chest pain  Patient Profile:   Elizabeth Spencer is a 37 y.o. female with a h/o bipolar affective disorder, GERD, migraines, chronic low back pain, pre-diabetes,mixed HLD, h/o drug use, tobacco use who is being seen 09/01/2022 for the evaluation of chest pain.  History of Present Illness:   Ms. Woehler has not been seen by cardiology in the past.  Family history positive for father with heart issues.  Patient reports a history of drug use.  She drinks alcohol weekly.  She reports she was recently diagnosed with factor V Leiden and was been going to be started on a blood thinner.  She reports no recent drug use.  She reports tobacco use.  Patient presented to the ER at [facility] on 08/31/2022 with chest pain.  She reported remittent chest pain since Mother's Day.  It is centralized chest pain described as something sitting on her chest.  Episodes are lasting up to 30 minutes and slowly got worse.  She reported associated nausea and vomiting.  She reported nausea and vomiting the last 3 days.  She denies any recent fevers or lower leg edema.  In the ER blood pressure was 98/88, pulse rate 123 bpm, respiratory rate 20, afebrile, 94% O2.  Labs showed high-sensitivity troponin 317, 576, 1031, 1163.  [place]ement showed serum creatinine 0.6, BUN 7, K3.5, WBC 8.4, hemoglobin 13.7.  Normal LFTs.  Lipase 30.  Chest x-ray was unremarkable.  Chest CT showed no PE, was positive for cholelithiasis.  Right upper quadrant ultrasound showed no definitive findings of acute cholecystitis.  UDS was positive for cocaine.  The patient was started on IV heparin and admitted for further workup.  Patient was taken to the Cath Lab and found to have critical ostial LAD disease not  well-suited for PCI.  Images were reapproved by cardiology and cardiac surgery teams at [facility].  Plan to transfer patient to [facility] for CABG tomorrow.   Past Medical History:  Diagnosis Date   Aneurysm (HCC) 01/04/2014   Arthritis    Asthma    WELL CONTROLLED   Brain aneurysm 2007   NEUROLOGY NOTE DOES NOT MENTION ANEURYSM BUT PT STATES SHE DID NOT HAVE TO HAVE SURGERY   Complication of anesthesia    FOR 1 C-SECTION PT WAS ITCHING AND VERY RED ON HER FACE   Cough, persistent 01/27/2016   Dermatitis due to sunburn 11/08/2013   Family history of adverse reaction to anesthesia    brother, neice and nephew got red in face with hives   Gallstones    GERD (gastroesophageal reflux disease)    Gonorrhea 06/20/2020   Headache    CHRONIC HEADACHES   History of chronic cough    DRY   Loss of memory 05/30/2017   Perforation of left tympanic membrane 05/17/2013   Pneumonia    PTSD (post-traumatic stress disorder)    PTSD (post-traumatic stress disorder)    Thyroid condition    PT WAS JUST TOLD ON 08-25-17 THAT SHE HAS A THYROID PROBLEM AND IS GOING TO F/U WITH ENDOCRINOLOGIST IN 2 WEEKS   Trichomonas vaginalis (TV) infection 06/20/2020    Past Surgical History:  Procedure Laterality Date   ABDOMINAL HYSTERECTOMY     CESAREAN SECTION     x3  CYSTOSCOPY N/A 09/29/2017   Procedure: CYSTOSCOPY;  Surgeon: Vena Austria, MD;  Location: [facility] ORS;  Service: Gynecology;  Laterality: N/A;   CYSTOSCOPY N/A 11/05/2017   Procedure: CYSTOSCOPY;  Surgeon: Vena Austria, MD;  Location: [facility] ORS;  Service: Gynecology;  Laterality: N/A;   HYSTEROSCOPY WITH D & C N/A 09/01/2017   Procedure: DILATATION AND CURETTAGE /HYSTEROSCOPY;  Surgeon: Vena Austria, MD;  Location: [facility] ORS;  Service: Gynecology;  Laterality: N/A;   LAPAROSCOPY N/A 09/01/2017   Procedure: LAPAROSCOPY OPERATIVE with biopsy;  Surgeon: Vena Austria, MD;  Location: [facility] ORS;  Service: Gynecology;  Laterality: N/A;    LAPAROSCOPY N/A 11/05/2017   Procedure: LAPAROSCOPY DIAGNOSTIC;  Surgeon: Vena Austria, MD;  Location: [facility] ORS;  Service: Gynecology;  Laterality: N/A;   REPAIR VAGINAL CUFF N/A 11/05/2017   Procedure: REPAIR VAGINAL CUFF;  Surgeon: Vena Austria, MD;  Location: [facility] ORS;  Service: Gynecology;  Laterality: N/A;   TOTAL LAPAROSCOPIC HYSTERECTOMY WITH SALPINGECTOMY Bilateral 09/29/2017   Procedure: TOTAL LAPAROSCOPIC HYSTERECTOMY WITH SALPINGECTOMY;  Surgeon: Vena Austria, MD;  Location: [facility] ORS;  Service: Gynecology;  Laterality: Bilateral;     Medications Prior to Admission: Prior to Admission medications   Medication Sig Start Date End Date Taking? Authorizing Provider  albuterol (VENTOLIN HFA) 108 (90 Base) MCG/ACT inhaler Inhale 2 puffs into the lungs every 6 (six) hours as needed for wheezing or shortness of breath. 06/17/22   Miki Kins, FNP  aspirin 81 MG chewable tablet Chew 1 tablet (81 mg total) by mouth daily. 09/02/22   Gillis Santa, MD  atorvastatin (LIPITOR) 40 MG tablet Take 1 tablet (40 mg total) by mouth daily. 09/02/22   Gillis Santa, MD  cetirizine (ZYRTEC) 10 MG tablet Take 1 tablet (10 mg total) by mouth daily. 06/17/22   Miki Kins, FNP  EPIPEN 2-PAK 0.3 MG/0.3ML SOAJ injection INJECT INTO THIGH MUSCLE THROUGH CLOTHES AS NEEDED SEVERE ALLERGIC REACTION 06/17/22   Miki Kins, FNP  fluconazole (DIFLUCAN) 150 MG tablet Take 150 mg by mouth every 3 (three) days. Patient not taking: Reported on 09/01/2022 01/23/20   [provider]  fluticasone (FLONASE) 50 MCG/ACT nasal spray 1 spray by Each Nare route daily. 06/17/22   Miki Kins, FNP  hydrOXYzine (ATARAX/VISTARIL) 25 MG tablet Take 25 mg by mouth 3 (three) times daily. Patient not taking: Reported on 09/01/2022 01/02/20   [provider]  methimazole (TAPAZOLE) 5 MG tablet Take by mouth. 08/31/18   [provider]  metoprolol tartrate (LOPRESSOR) 25 MG tablet Take 1  tablet (25 mg total) by mouth 2 (two) times daily. 09/01/22   Gillis Santa, MD  montelukast (SINGULAIR) 10 MG tablet Take 1 tablet (10 mg total) by mouth daily. 06/17/22   Miki Kins, FNP  Multiple Vitamins-Minerals (CENTRUM ADULTS PO) Take 1 tablet by mouth at bedtime.     [provider]  naproxen (NAPROSYN) 500 MG tablet Take 500 mg by mouth 2 (two) times daily as needed for moderate pain or headache. 11/12/20   [provider]  nitroGLYCERIN (NITROSTAT) 0.4 MG SL tablet Place 1 tablet (0.4 mg total) under the tongue every 5 (five) minutes as needed for chest pain. 09/01/22   Gillis Santa, MD  omeprazole (PRILOSEC) 20 MG capsule Take 1 capsule (20 mg total) by mouth 2 (two) times daily before a meal. 06/17/22   Miki Kins, FNP  phentermine (ADIPEX-P) 37.5 MG tablet Take 1 tablet (37.5 mg total) by mouth daily before breakfast. 06/17/22  Miki Kins, FNP     Allergies:    Allergies  Allergen Reactions   Azithromycin Swelling and Other (See Comments)   Clindamycin Swelling   Codeine Hives and Other (See Comments)    Nausea/dizzy Nausea/dizzy   Meloxicam Other (See Comments)    Burns up from the inside    Neurontin [Gabapentin] Other (See Comments)    Makes her pass out and not remember what happened prior to taking med.   Penicillins Anaphylaxis    Patient allergic to all "cillins" Has patient had a PCN reaction causing immediate rash, facial/tongue/throat swelling, SOB or lightheadedness with hypotension: Yes Has patient had a PCN reaction causing severe rash involving mucus membranes or skin necrosis: Yes Has patient had a PCN reaction that required hospitalization: No Has patient had a PCN reaction occurring within the last 10 years: Yes If all of the above answers are "NO", then may proceed with Cephalosporin use.    Clindamycin/Lincomycin Swelling   Other Hives and Other (See Comments)    Berry flavored food and drinks   Diphenhydramine Hcl  Rash and Other (See Comments)   Doxycycline Rash    Social History:   Social History   Socioeconomic History   Marital status: Single    Spouse name: Not on file   Number of children: Not on file   Years of education: Not on file   Highest education level: Not on file  Occupational History   Not on file  Tobacco Use   Smoking status: Every Day    Packs/day: 0.50    Years: 18.00    Additional pack years: 0.00    Total pack years: 9.00    Types: Cigarettes   Smokeless tobacco: Never  Vaping Use   Vaping Use: Never used  Substance and Sexual Activity   Alcohol use: Yes    Alcohol/week: 0.0 standard drinks of alcohol    Comment: occasionally   Drug use: Not Currently    Types: Marijuana   Sexual activity: Yes    Partners: Male    Birth control/protection: Surgical    Comment: Hysterectomy  Other Topics Concern   Not on file  Social History Narrative   Not on file   Social Determinants of Health   Financial Resource Strain: Not on file  Food Insecurity: Not on file  Transportation Needs: Not on file  Physical Activity: Not on file  Stress: Not on file  Social Connections: Not on file  Intimate Partner Violence: Not on file    Family History:   The patient's family history includes Alcohol abuse in her father and mother; COPD in her father; Deep vein thrombosis in her mother; Emphysema in her father; Heart disease in her father; Hypertension in her mother.    ROS:  Please see the history of present illness.  All other ROS reviewed and negative.     Physical Exam/Data:  There were no vitals filed for this visit.  Intake/Output Summary (Last 24 hours) at 09/01/2022 1337 Last data filed at 09/01/2022 1013 Gross per 24 hour  Intake 75.11 ml  Output --  Net 75.11 ml      09/01/2022   11:08 AM 08/31/2022    4:25 PM 07/15/2022    1:41 PM  Last 3 Weights  Weight (lbs) 220 lb 0.3 oz 220 lb 230 lb 6.4 oz  Weight (kg) 99.8 kg 99.791 kg 104.509 kg     There is no  height or weight on file to calculate BMI.  General:  Well nourished, well developed, in no acute distress HEENT: normal Neck: no JVD Vascular: No carotid bruits; Distal pulses 2+ bilaterally   Cardiac:  normal S1, S2; RRR; no murmur  Lungs:  clear to auscultation bilaterally, no wheezing, rhonchi or rales  Abd: soft, nontender, no hepatomegaly  Ext: no edema Musculoskeletal:  No deformities, BUE and BLE strength normal and equal Skin: warm and dry  Neuro:  CNs 2-12 intact, no focal abnormalities noted Psych:  Normal affect    EKG:  The ECG that was done was personally reviewed and demonstrates ST 118bpm, TWI aVL   Relevant CV Studies:  Cardiac cath 09/01/22 Conclusions: Severe single-vessel coronary artery disease, with hazy and eccentric 90% ostial LAD stenosis concerning for acute plaque rupture.  Tubular 40% proximal RCA stenosis is also present.  Large LCx without significant disease. Moderately to severely reduced left ventricular systolic function (LVEF 30-35%) with anterior wall hypokinesis/akinesis.  LVEF 30-35%. Moderately elevated left ventricular filling pressure (LVEDP 25 mmHg).   Recommendations: Critical ostial LAD disease not well-suited for PCI given risk for compromise of LCx and D1.  Images reviewed with interventional cardiology and cardiac surgery teams at [facility]; we will arrange for transfer to [facility] with plans for CABG tomorrow. Restart heparin infusion 2 hours after TR band removal. Titrate NTG infusion for relief of chest pain.  IABP deferred due to patient's inability to lie flat and remain still. Aggressive secondary prevention of coronary artery disease. Obtain echocardiogram.   Yvonne Kendall, MD [facility]    Coronary Diagrams  Diagnostic Dominance: Right     Laboratory Data:  High Sensitivity Troponin:   Recent Labs  Lab 08/31/22 1850 08/31/22 2200 09/01/22 0006 09/01/22 0855 09/01/22 1007  TROPONINIHS 576* 1,031* 1,163*  765* 687*      Chemistry Recent Labs  Lab 08/31/22 1633 09/01/22 0202  NA 139 138  K 3.5 3.2*  CL 108 108  CO2 24 25  GLUCOSE 109* 98  BUN 7 7  CREATININE 0.62 0.55  CALCIUM 8.9 8.3*  MG  --  1.9  GFRNONAA >60 >60  ANIONGAP 7 5    Recent Labs  Lab 08/31/22 1633 09/01/22 0202  PROT 6.5 6.0*  ALBUMIN 3.5 3.4*  AST 38 40  ALT 36 34  ALKPHOS 71 64  BILITOT 0.4 0.5   Lipids  Recent Labs  Lab 08/31/22 2200  CHOL 135  TRIG 110  HDL 46  LDLCALC 67  CHOLHDL 2.9   Hematology Recent Labs  Lab 08/31/22 1633 09/01/22 0202  WBC 8.8 8.4  RBC 4.83 4.23  HGB 13.7 12.4  HCT 43.3 38.5  MCV 89.6 91.0  MCH 28.4 29.3  MCHC 31.6 32.2  RDW 12.8 12.8  PLT 287 237   Thyroid  Recent Labs  Lab 08/31/22 2200  TSH <0.010*  FREET4 1.74*   BNPNo results for input(s): "BNP", "PROBNP" in the last 168 hours.  DDimer No results for input(s): "DDIMER" in the last 168 hours.   Radiology/Studies:  CARDIAC CATHETERIZATION  Result Date: 09/01/2022 Conclusions: Severe single-vessel coronary artery disease, with hazy and eccentric 90% ostial LAD stenosis concerning for acute plaque rupture.  Tubular 40% proximal RCA stenosis is also present.  Large LCx without significant disease. Moderately to severely reduced left ventricular systolic function (LVEF 30-35%) with anterior wall hypokinesis/akinesis.  LVEF 30-35%. Moderately elevated left ventricular filling pressure (LVEDP 25 mmHg). Recommendations: Critical ostial LAD disease not well-suited for PCI given risk for compromise of LCx and  D1.  Images reviewed with interventional cardiology and cardiac surgery teams at [facility]; we will arrange for transfer to [facility] with plans for CABG tomorrow. Restart heparin infusion 2 hours after TR band removal. Titrate NTG infusion for relief of chest pain.  IABP deferred due to patient's inability to lie flat and remain still. Aggressive secondary prevention of coronary artery disease. Obtain  echocardiogram. Yvonne Kendall, MD [facility]  US Abdomen Limited RUQ (LIVER/GB)  Result Date: 08/31/2022 CLINICAL DATA:  [id number] Vomiting [id number] EXAM: ULTRASOUND ABDOMEN LIMITED RIGHT UPPER QUADRANT COMPARISON:  CT angiography chest 08/31/2022 FINDINGS: Gallbladder: Multiple calcified gallstones within the gallbladder lumen. Associated gallbladder wall thickening of 4 mm. No pericholecystic fluid. No sonographic Murphy sign noted by sonographer. Common bile duct: Diameter: 3 mm Liver: No focal lesion identified. Increased parenchymal echogenicity. Portal vein is patent on color Doppler imaging with normal direction of blood flow towards the liver. Other: None. IMPRESSION: 1. Cholelithiasis with nonspecific mild gallbladder wall thickening. Negative sonographic Murphy sign and no pericholecystic fluid. No definite findings of acute cholecystitis. 2. Hepatic steatosis. Please note limited evaluation for focal hepatic masses in a patient with hepatic steatosis due to decreased penetration of the acoustic ultrasound waves. Electronically Signed   By: Tish Frederickson M.D.   On: 08/31/2022 21:34   CT Angio Chest PE W and/or Wo Contrast  Result Date: 08/31/2022 CLINICAL DATA:  Chest pain EXAM: CT ANGIOGRAPHY CHEST WITH CONTRAST TECHNIQUE: Multidetector CT imaging of the chest was performed using the standard protocol during bolus administration of intravenous contrast. Multiplanar CT image reconstructions and MIPs were obtained to evaluate the vascular anatomy. RADIATION DOSE REDUCTION: This exam was performed according to the departmental dose-optimization program which includes automated exposure control, adjustment of the mA and/or kV according to patient size and/or use of iterative reconstruction technique. CONTRAST:  75mL OMNIPAQUE IOHEXOL 350 MG/ML SOLN COMPARISON:  None Available. FINDINGS: Cardiovascular: No filling defects in the pulmonary arteries to suggest pulmonary emboli. Heart is normal size.  Aorta is normal caliber. Mediastinum/Nodes: No mediastinal, hilar, or axillary adenopathy. Trachea and esophagus are unremarkable. Thyroid unremarkable. Lungs/Pleura: Lungs are clear. No focal airspace opacities or effusions. 7 mm nodule in the right lower lobe at the right lung base posteriorly on image 118 of series 5. Upper Abdomen: Multiple gallstones within the gallbladder. No acute findings. Musculoskeletal: Chest wall soft tissues are unremarkable. No acute bony abnormality. Review of the MIP images confirms the above findings. IMPRESSION: No evidence of pulmonary embolus. 7 mm right lower lobe pulmonary nodule. Non-contrast chest CT at 6-12 months is recommended. If the nodule is stable at time of repeat CT, then future CT at 18-24 months (from today's scan) is considered optional for low-risk patients, but is recommended for high-risk patients. This recommendation follows the consensus statement: Guidelines for Management of Incidental Pulmonary Nodules Detected on CT Images: From the Fleischner Society 2017; Radiology 2017; 284:228-243. Cholelithiasis. Electronically Signed   By: Charlett Nose M.D.   On: 08/31/2022 19:17   DG Chest 2 View  Result Date: 08/31/2022 CLINICAL DATA:  Chest pain x2 weeks with vomiting. EXAM: CHEST - 2 VIEW COMPARISON:  September 18, 2018 FINDINGS: The heart size and mediastinal contours are within normal limits. Low lung volumes are noted. Both lungs are clear. The visualized skeletal structures are unremarkable. IMPRESSION: No active cardiopulmonary disease. Electronically Signed   By: Aram Candela M.D.   On: 08/31/2022 17:04     Assessment and Plan:   NSTEMI CAD -  patient presented with chest pain found to have elevated troponin >1000 started on IV heparin - started on Aspirin 81mg  daily, Lipitor and metoprolol - may need to change to Coreg with cocaine use - echo has been ordered - she was taken for cath which showed critical ostial lesion concerning for acute  plaque rupture, not suitable for PCI. Cath showed reduced LVEF 30-35% with anterior wall HK/alinesis - restart IV heparin 2 hours after TR band removal - continue nitro gtt for chest pain - plan to transfer to [facility] for CABG tomorrow. NPO at midnight.   HFrEF ICM - cath showed LVEF 30-35% with moderately elevated filling pressures  - she was given IV lasix 20 in the cath lab - echo has been ordered - does not appear significantly volume up on exam. - continue BB. Titrate GDMT as able  RUQ pain - RUQ US showed gallstones with no definitive acute cholecystitis - discussed with general surgery, may need to see GI/surgery at [facility] depending on symptoms  Cocaine use - UDS + cocaine - she reports cocaine use a month ago - cessation recommended  Factor 5 Leiden - recently diagnosed in the outpatient setting  HLD - LDL 67 - continue Lipitor 40mg  daily.   Severity of Illness: The appropriate patient status for this patient is INPATIENT. Inpatient status is judged to be reasonable and necessary in order to provide the required intensity of service to ensure the patient's safety. The patient's presenting symptoms, physical exam findings, and initial radiographic and laboratory data in the context of their chronic comorbidities is felt to place them at high risk for further clinical deterioration. Furthermore, it is not anticipated that the patient will be medically stable for discharge from the hospital within 2 midnights of admission.   * I certify that at the point of admission it is my clinical judgment that the patient will require inpatient hospital care spanning beyond 2 midnights from the point of admission due to high intensity of service, high risk for further deterioration and high frequency of surveillance required.*   For questions or updates, please contact [facility] Please consult [email or web address] for contact info under     Signed, Kirstin Kugler David Stall, PA-C  09/01/2022 1:37  PM

## 2022-09-01 NOTE — ED Notes (Signed)
Patient reporting increasing chest pain and is willing to try a narcotic, Dr. Okey Dupre notified.

## 2022-09-01 NOTE — Consult Note (Signed)
ANTICOAGULATION CONSULT NOTE  Pharmacy Consult for heparin Indication: chest pain/ACS  Allergies  Allergen Reactions   Azithromycin Swelling and Other (See Comments)   Clindamycin Swelling   Codeine Hives and Other (See Comments)    Nausea/dizzy Nausea/dizzy   Meloxicam Other (See Comments)    Burns up from the inside    Neurontin [Gabapentin] Other (See Comments)    Makes her pass out and not remember what happened prior to taking med.   Penicillins Anaphylaxis    Patient allergic to all "cillins" Has patient had a PCN reaction causing immediate rash, facial/tongue/throat swelling, SOB or lightheadedness with hypotension: Yes Has patient had a PCN reaction causing severe rash involving mucus membranes or skin necrosis: Yes Has patient had a PCN reaction that required hospitalization: No Has patient had a PCN reaction occurring within the last 10 years: Yes If all of the above answers are "NO", then may proceed with Cephalosporin use.    Clindamycin/Lincomycin Swelling   Other Hives and Other (See Comments)    Berry flavored food and drinks   Diphenhydramine Hcl Rash and Other (See Comments)   Doxycycline Rash    Patient Measurements: Height: 5\' 7"  (170.2 cm) Weight: 99.8 kg (220 lb) IBW/kg (Calculated) : 61.6 Heparin Dosing Weight: 83.8  Vital Signs: Temp: 98.3 F (36.8 C) (05/28 1625) Temp Source: Oral (05/28 1625) BP: 110/76 (05/28 2330) Pulse Rate: 108 (05/29 0215)  Labs: Recent Labs    08/31/22 1633 08/31/22 1850 08/31/22 2200 09/01/22 0006 09/01/22 0202  HGB 13.7  --   --   --  12.4  HCT 43.3  --   --   --  38.5  PLT 287  --   --   --  237  APTT  --  27  --   --   --   LABPROT  --  13.2  --   --   --   INR  --  1.0  --   --   --   HEPARINUNFRC  --   --   --   --  <0.10*  CREATININE 0.62  --   --   --  0.55  TROPONINIHS 317* 576* 1,031* 1,163*  --      Estimated Creatinine Clearance: 114.6 mL/min (by C-G formula based on SCr of 0.55  mg/dL).   Medical History: Past Medical History:  Diagnosis Date   Aneurysm (HCC) 01/04/2014   Arthritis    Asthma    WELL CONTROLLED   Brain aneurysm 2007   NEUROLOGY NOTE DOES NOT MENTION ANEURYSM BUT PT STATES SHE DID NOT HAVE TO HAVE SURGERY   Complication of anesthesia    FOR 1 C-SECTION PT WAS ITCHING AND VERY RED ON HER FACE   Cough, persistent 01/27/2016   Dermatitis due to sunburn 11/08/2013   Family history of adverse reaction to anesthesia    brother, neice and nephew got red in face with hives   Gallstones    GERD (gastroesophageal reflux disease)    Gonorrhea 06/20/2020   Headache    CHRONIC HEADACHES   History of chronic cough    DRY   Loss of memory 05/30/2017   Perforation of left tympanic membrane 05/17/2013   Pneumonia    PTSD (post-traumatic stress disorder)    PTSD (post-traumatic stress disorder)    Thyroid condition    PT WAS JUST TOLD ON 08-25-17 THAT SHE HAS A THYROID PROBLEM AND IS GOING TO F/U WITH ENDOCRINOLOGIST IN 2 WEEKS   Trichomonas  vaginalis (TV) infection 06/20/2020    Medications:  No chronic anticoagulation PTA from chart review  Assessment: 37 y.o. female with PMH MDD/GAD, GERD, migraines, bipolar disorder who presents with chest pain and emesis. Troponins trending up, from 317 >> 576. ECG shows sinus tachycardia and currently in process of interpretation. Pharmacy has been consulted to initiate heparin for chest pain/ACS. Patient not on chronic anticoagulation per chart review.  Goal of Therapy:  Heparin level 0.3-0.7 units/ml Monitor platelets by anticoagulation protocol: Yes  Baseline Labs: aPTT - ordered; INR - ordered; Hgb - 13.7; PLT - 287  Date Time aPTT/HL Rate/Comment 05/29 0202 HL <0.10 subtherapeutic     Plan:  Give 2500 units bolus x1;  Increase heparin infusion to 1250 units/hr Recheck HL in 6 hr after rate change Continue to monitor H&H and platelets daily while on heparin gtt.  Otelia Sergeant, PharmD,  MBA 09/01/2022 3:12 AM

## 2022-09-01 NOTE — ED Notes (Signed)
Heparin gtt held per Dr. Serita Kyle order for patient to prepare for cath lab

## 2022-09-01 NOTE — Progress Notes (Signed)
*  PRELIMINARY RESULTS* Echocardiogram 2D Echocardiogram has been performed.  Elizabeth Spencer 09/01/2022, 2:39 PM

## 2022-09-01 NOTE — Progress Notes (Signed)
Pt with Chest pain rated 6/10 on 0-10 pain scale. Rhythm st 110, B/P 89/57 , oxygen SATs 99% on 2 liters oxygen via nasal cannula. Cardiology called to the bedside. Awaiting orders.

## 2022-09-02 ENCOUNTER — Inpatient Hospital Stay (HOSPITAL_COMMUNITY): Admission: RE | Admit: 2022-09-02 | Payer: Medicaid Other | Source: Home / Self Care | Admitting: Surgery

## 2022-09-02 ENCOUNTER — Inpatient Hospital Stay (HOSPITAL_COMMUNITY): Payer: Medicaid Other

## 2022-09-02 ENCOUNTER — Other Ambulatory Visit: Payer: Self-pay

## 2022-09-02 ENCOUNTER — Inpatient Hospital Stay (HOSPITAL_COMMUNITY): Payer: Medicaid Other | Admitting: Certified Registered"

## 2022-09-02 ENCOUNTER — Inpatient Hospital Stay (HOSPITAL_COMMUNITY): Disposition: A | Discharge status: Home / Self Care | Payer: Self-pay | Source: Ambulatory Visit | Attending: Surgery

## 2022-09-02 ENCOUNTER — Encounter: Payer: Self-pay | Admitting: Internal Medicine

## 2022-09-02 DIAGNOSIS — I252 Old myocardial infarction: Secondary | ICD-10-CM

## 2022-09-02 DIAGNOSIS — J45909 Unspecified asthma, uncomplicated: Secondary | ICD-10-CM

## 2022-09-02 DIAGNOSIS — I214 Non-ST elevation (NSTEMI) myocardial infarction: Secondary | ICD-10-CM

## 2022-09-02 DIAGNOSIS — F17210 Nicotine dependence, cigarettes, uncomplicated: Secondary | ICD-10-CM

## 2022-09-02 DIAGNOSIS — I2510 Atherosclerotic heart disease of native coronary artery without angina pectoris: Secondary | ICD-10-CM

## 2022-09-02 DIAGNOSIS — Z951 Presence of aortocoronary bypass graft: Secondary | ICD-10-CM

## 2022-09-02 HISTORY — PX: CORONARY ARTERY BYPASS GRAFT: SHX141

## 2022-09-02 HISTORY — PX: TEE WITHOUT CARDIOVERSION: SHX5443

## 2022-09-02 LAB — CBC
HCT: 40.7 % (ref 36.0–46.0)
HCT: 36 % (ref 36.0–46.0)
HCT: 39.5 % (ref 36.0–46.0)
HCT: 39.5 % (ref 36.0–46.0)
Hemoglobin: 13.2 g/dL (ref 12.0–15.0)
Hemoglobin: 11.8 g/dL — ABNORMAL LOW (ref 12.0–15.0)
Hemoglobin: 12.5 g/dL (ref 12.0–15.0)
Hemoglobin: 12.7 g/dL (ref 12.0–15.0)
MCH: 29.8 pg (ref 26.0–34.0)
MCH: 29.2 pg (ref 26.0–34.0)
MCH: 28.9 pg (ref 26.0–34.0)
MCH: 29.3 pg (ref 26.0–34.0)
MCHC: 32.4 g/dL (ref 30.0–36.0)
MCHC: 32.8 g/dL (ref 30.0–36.0)
MCHC: 31.6 g/dL (ref 30.0–36.0)
MCHC: 32.2 g/dL (ref 30.0–36.0)
MCV: 91.9 fL (ref 80.0–100.0)
MCV: 89.1 fL (ref 80.0–100.0)
MCV: 91.4 fL (ref 80.0–100.0)
MCV: 91.2 fL (ref 80.0–100.0)
Platelets: 233 10*3/uL (ref 150–400)
Platelets: 216 10*3/uL (ref 150–400)
Platelets: 238 10*3/uL (ref 150–400)
Platelets: 217 10*3/uL (ref 150–400)
RBC: 4.43 MIL/uL (ref 3.87–5.11)
RBC: 4.04 MIL/uL (ref 3.87–5.11)
RBC: 4.32 MIL/uL (ref 3.87–5.11)
RBC: 4.33 MIL/uL (ref 3.87–5.11)
RDW: 13 % (ref 11.5–15.5)
RDW: 13 % (ref 11.5–15.5)
RDW: 12.8 % (ref 11.5–15.5)
RDW: 12.8 % (ref 11.5–15.5)
WBC: 9.1 10*3/uL (ref 4.0–10.5)
WBC: 7.5 10*3/uL (ref 4.0–10.5)
WBC: 23.5 10*3/uL — ABNORMAL HIGH (ref 4.0–10.5)
WBC: 25.5 10*3/uL — ABNORMAL HIGH (ref 4.0–10.5)
nRBC: 0 % (ref 0.0–0.2)
nRBC: 0 % (ref 0.0–0.2)
nRBC: 0 % (ref 0.0–0.2)
nRBC: 0 % (ref 0.0–0.2)

## 2022-09-02 LAB — POCT I-STAT, CHEM 8
BUN: 4 mg/dL — ABNORMAL LOW (ref 6–20)
BUN: 5 mg/dL — ABNORMAL LOW (ref 6–20)
BUN: 4 mg/dL — ABNORMAL LOW (ref 6–20)
BUN: 4 mg/dL — ABNORMAL LOW (ref 6–20)
Calcium, Ion: 1.27 mmol/L (ref 1.15–1.40)
Calcium, Ion: 1.21 mmol/L (ref 1.15–1.40)
Calcium, Ion: 1.15 mmol/L (ref 1.15–1.40)
Calcium, Ion: 1.17 mmol/L (ref 1.15–1.40)
Chloride: 106 mmol/L (ref 98–111)
Chloride: 106 mmol/L (ref 98–111)
Chloride: 105 mmol/L (ref 98–111)
Chloride: 105 mmol/L (ref 98–111)
Creatinine, Ser: 0.4 mg/dL — ABNORMAL LOW (ref 0.44–1.00)
Creatinine, Ser: 0.4 mg/dL — ABNORMAL LOW (ref 0.44–1.00)
Creatinine, Ser: 0.4 mg/dL — ABNORMAL LOW (ref 0.44–1.00)
Creatinine, Ser: 0.5 mg/dL (ref 0.44–1.00)
Glucose, Bld: 104 mg/dL — ABNORMAL HIGH (ref 70–99)
Glucose, Bld: 96 mg/dL (ref 70–99)
Glucose, Bld: 172 mg/dL — ABNORMAL HIGH (ref 70–99)
Glucose, Bld: 193 mg/dL — ABNORMAL HIGH (ref 70–99)
HCT: 33 % — ABNORMAL LOW (ref 36.0–46.0)
HCT: 35 % — ABNORMAL LOW (ref 36.0–46.0)
HCT: 26 % — ABNORMAL LOW (ref 36.0–46.0)
HCT: 30 % — ABNORMAL LOW (ref 36.0–46.0)
Hemoglobin: 11.2 g/dL — ABNORMAL LOW (ref 12.0–15.0)
Hemoglobin: 11.9 g/dL — ABNORMAL LOW (ref 12.0–15.0)
Hemoglobin: 10.2 g/dL — ABNORMAL LOW (ref 12.0–15.0)
Hemoglobin: 8.8 g/dL — ABNORMAL LOW (ref 12.0–15.0)
Potassium: 3.8 mmol/L (ref 3.5–5.1)
Potassium: 5.1 mmol/L (ref 3.5–5.1)
Potassium: 3.7 mmol/L (ref 3.5–5.1)
Potassium: 5.1 mmol/L (ref 3.5–5.1)
Sodium: 139 mmol/L (ref 135–145)
Sodium: 137 mmol/L (ref 135–145)
Sodium: 139 mmol/L (ref 135–145)
Sodium: 141 mmol/L (ref 135–145)
TCO2: 25 mmol/L (ref 22–32)
TCO2: 29 mmol/L (ref 22–32)
TCO2: 23 mmol/L (ref 22–32)
TCO2: 25 mmol/L (ref 22–32)

## 2022-09-02 LAB — POCT I-STAT 7, (LYTES, BLD GAS, ICA,H+H)
Acid-base deficit: 2 mmol/L (ref 0.0–2.0)
Acid-base deficit: 2 mmol/L (ref 0.0–2.0)
Acid-base deficit: 5 mmol/L — ABNORMAL HIGH (ref 0.0–2.0)
Acid-base deficit: 5 mmol/L — ABNORMAL HIGH (ref 0.0–2.0)
Acid-base deficit: 3 mmol/L — ABNORMAL HIGH (ref 0.0–2.0)
Acid-base deficit: 5 mmol/L — ABNORMAL HIGH (ref 0.0–2.0)
Bicarbonate: 23.7 mmol/L (ref 20.0–28.0)
Bicarbonate: 23.6 mmol/L (ref 20.0–28.0)
Bicarbonate: 21.4 mmol/L (ref 20.0–28.0)
Bicarbonate: 23.2 mmol/L (ref 20.0–28.0)
Bicarbonate: 22.4 mmol/L (ref 20.0–28.0)
Bicarbonate: 19.5 mmol/L — ABNORMAL LOW (ref 20.0–28.0)
Calcium, Ion: 1.29 mmol/L (ref 1.15–1.40)
Calcium, Ion: 1.05 mmol/L — ABNORMAL LOW (ref 1.15–1.40)
Calcium, Ion: 1.16 mmol/L (ref 1.15–1.40)
Calcium, Ion: 1.21 mmol/L (ref 1.15–1.40)
Calcium, Ion: 1.19 mmol/L (ref 1.15–1.40)
Calcium, Ion: 1.16 mmol/L (ref 1.15–1.40)
HCT: 34 % — ABNORMAL LOW (ref 36.0–46.0)
HCT: 28 % — ABNORMAL LOW (ref 36.0–46.0)
HCT: 27 % — ABNORMAL LOW (ref 36.0–46.0)
HCT: 38 % (ref 36.0–46.0)
HCT: 38 % (ref 36.0–46.0)
HCT: 36 % (ref 36.0–46.0)
Hemoglobin: 11.6 g/dL — ABNORMAL LOW (ref 12.0–15.0)
Hemoglobin: 9.5 g/dL — ABNORMAL LOW (ref 12.0–15.0)
Hemoglobin: 9.2 g/dL — ABNORMAL LOW (ref 12.0–15.0)
Hemoglobin: 12.9 g/dL (ref 12.0–15.0)
Hemoglobin: 12.9 g/dL (ref 12.0–15.0)
Hemoglobin: 12.2 g/dL (ref 12.0–15.0)
O2 Saturation: 99 %
O2 Saturation: 100 %
O2 Saturation: 99 %
O2 Saturation: 88 %
O2 Saturation: 97 %
O2 Saturation: 96 %
Patient temperature: 36.8
Patient temperature: 37.4
Patient temperature: 37.4
Potassium: 3.8 mmol/L (ref 3.5–5.1)
Potassium: 4.6 mmol/L (ref 3.5–5.1)
Potassium: 3.8 mmol/L (ref 3.5–5.1)
Potassium: 3.9 mmol/L (ref 3.5–5.1)
Potassium: 4.5 mmol/L (ref 3.5–5.1)
Potassium: 3.8 mmol/L (ref 3.5–5.1)
Sodium: 139 mmol/L (ref 135–145)
Sodium: 139 mmol/L (ref 135–145)
Sodium: 141 mmol/L (ref 135–145)
Sodium: 142 mmol/L (ref 135–145)
Sodium: 140 mmol/L (ref 135–145)
Sodium: 140 mmol/L (ref 135–145)
TCO2: 25 mmol/L (ref 22–32)
TCO2: 25 mmol/L (ref 22–32)
TCO2: 23 mmol/L (ref 22–32)
TCO2: 25 mmol/L (ref 22–32)
TCO2: 24 mmol/L (ref 22–32)
TCO2: 21 mmol/L — ABNORMAL LOW (ref 22–32)
pCO2 arterial: 44.6 mmHg (ref 32–48)
pCO2 arterial: 44 mmHg (ref 32–48)
pCO2 arterial: 45.3 mmHg (ref 32–48)
pCO2 arterial: 54.7 mmHg — ABNORMAL HIGH (ref 32–48)
pCO2 arterial: 39.7 mmHg (ref 32–48)
pCO2 arterial: 34.4 mmHg (ref 32–48)
pH, Arterial: 7.334 — ABNORMAL LOW (ref 7.35–7.45)
pH, Arterial: 7.339 — ABNORMAL LOW (ref 7.35–7.45)
pH, Arterial: 7.282 — ABNORMAL LOW (ref 7.35–7.45)
pH, Arterial: 7.234 — ABNORMAL LOW (ref 7.35–7.45)
pH, Arterial: 7.361 (ref 7.35–7.45)
pH, Arterial: 7.364 (ref 7.35–7.45)
pO2, Arterial: 150 mmHg — ABNORMAL HIGH (ref 83–108)
pO2, Arterial: 314 mmHg — ABNORMAL HIGH (ref 83–108)
pO2, Arterial: 154 mmHg — ABNORMAL HIGH (ref 83–108)
pO2, Arterial: 64 mmHg — ABNORMAL LOW (ref 83–108)
pO2, Arterial: 92 mmHg (ref 83–108)
pO2, Arterial: 82 mmHg — ABNORMAL LOW (ref 83–108)

## 2022-09-02 LAB — POCT I-STAT EG7
Acid-base deficit: 2 mmol/L (ref 0.0–2.0)
Bicarbonate: 24.2 mmol/L (ref 20.0–28.0)
Calcium, Ion: 1.09 mmol/L — ABNORMAL LOW (ref 1.15–1.40)
HCT: 26 % — ABNORMAL LOW (ref 36.0–46.0)
Hemoglobin: 8.8 g/dL — ABNORMAL LOW (ref 12.0–15.0)
O2 Saturation: 76 %
Potassium: 5 mmol/L (ref 3.5–5.1)
Sodium: 139 mmol/L (ref 135–145)
TCO2: 26 mmol/L (ref 22–32)
pCO2, Ven: 49.8 mmHg (ref 44–60)
pH, Ven: 7.296 (ref 7.25–7.43)
pO2, Ven: 46 mmHg — ABNORMAL HIGH (ref 32–45)

## 2022-09-02 LAB — GLUCOSE, CAPILLARY
Glucose-Capillary: 101 mg/dL — ABNORMAL HIGH (ref 70–99)
Glucose-Capillary: 109 mg/dL — ABNORMAL HIGH (ref 70–99)
Glucose-Capillary: 120 mg/dL — ABNORMAL HIGH (ref 70–99)
Glucose-Capillary: 137 mg/dL — ABNORMAL HIGH (ref 70–99)
Glucose-Capillary: 137 mg/dL — ABNORMAL HIGH (ref 70–99)
Glucose-Capillary: 139 mg/dL — ABNORMAL HIGH (ref 70–99)
Glucose-Capillary: 141 mg/dL — ABNORMAL HIGH (ref 70–99)
Glucose-Capillary: 141 mg/dL — ABNORMAL HIGH (ref 70–99)
Glucose-Capillary: 142 mg/dL — ABNORMAL HIGH (ref 70–99)
Glucose-Capillary: 143 mg/dL — ABNORMAL HIGH (ref 70–99)
Glucose-Capillary: 156 mg/dL — ABNORMAL HIGH (ref 70–99)
Glucose-Capillary: 159 mg/dL — ABNORMAL HIGH (ref 70–99)
Glucose-Capillary: 171 mg/dL — ABNORMAL HIGH (ref 70–99)

## 2022-09-02 LAB — BASIC METABOLIC PANEL
Anion gap: 12 (ref 5–15)
Anion gap: 9 (ref 5–15)
Anion gap: 8 (ref 5–15)
BUN: 5 mg/dL — ABNORMAL LOW (ref 6–20)
BUN: 6 mg/dL (ref 6–20)
BUN: 6 mg/dL (ref 6–20)
CO2: 21 mmol/L — ABNORMAL LOW (ref 22–32)
CO2: 23 mmol/L (ref 22–32)
CO2: 22 mmol/L (ref 22–32)
Calcium: 8.5 mg/dL — ABNORMAL LOW (ref 8.9–10.3)
Calcium: 8.4 mg/dL — ABNORMAL LOW (ref 8.9–10.3)
Calcium: 8.1 mg/dL — ABNORMAL LOW (ref 8.9–10.3)
Chloride: 107 mmol/L (ref 98–111)
Chloride: 107 mmol/L (ref 98–111)
Chloride: 109 mmol/L (ref 98–111)
Creatinine, Ser: 0.61 mg/dL (ref 0.44–1.00)
Creatinine, Ser: 0.6 mg/dL (ref 0.44–1.00)
Creatinine, Ser: 0.71 mg/dL (ref 0.44–1.00)
GFR, Estimated: >60 mL/min (ref >60)
GFR, Estimated: >60 mL/min (ref >60)
GFR, Estimated: >60 mL/min (ref >60)
Glucose, Bld: 108 mg/dL — ABNORMAL HIGH (ref 70–99)
Glucose, Bld: 101 mg/dL — ABNORMAL HIGH (ref 70–99)
Glucose, Bld: 147 mg/dL — ABNORMAL HIGH (ref 70–99)
Potassium: 3.7 mmol/L (ref 3.5–5.1)
Potassium: 3.8 mmol/L (ref 3.5–5.1)
Potassium: 4.3 mmol/L (ref 3.5–5.1)
Sodium: 140 mmol/L (ref 135–145)
Sodium: 139 mmol/L (ref 135–145)
Sodium: 139 mmol/L (ref 135–145)

## 2022-09-02 LAB — HEMOGLOBIN A1C
Hgb A1c MFr Bld: 5.2 % (ref 4.8–5.6)
Mean Plasma Glucose: 103 mg/dL

## 2022-09-02 LAB — HEMOGLOBIN AND HEMATOCRIT, BLOOD
HCT: 26.8 % — ABNORMAL LOW (ref 36.0–46.0)
Hemoglobin: 8.5 g/dL — ABNORMAL LOW (ref 12.0–15.0)

## 2022-09-02 LAB — PROTIME-INR
INR: 1.4 — ABNORMAL HIGH (ref 0.8–1.2)
Prothrombin Time: 17.7 seconds — ABNORMAL HIGH (ref 11.4–15.2)

## 2022-09-02 LAB — PREPARE RBC (CROSSMATCH)

## 2022-09-02 LAB — TYPE AND SCREEN
ABO/RH(D): A NEG
Unit division: 0

## 2022-09-02 LAB — BPAM RBC

## 2022-09-02 LAB — ECHO INTRAOPERATIVE TEE
Height: 67 in
Weight: 3693.15 oz

## 2022-09-02 LAB — APTT: aPTT: 29 seconds (ref 24–36)

## 2022-09-02 LAB — CULTURE, BLOOD (ROUTINE X 2)

## 2022-09-02 LAB — PLATELET COUNT: Platelets: 187 10*3/uL (ref 150–400)

## 2022-09-02 LAB — MAGNESIUM: Magnesium: 2.6 mg/dL — ABNORMAL HIGH (ref 1.7–2.4)

## 2022-09-02 SURGERY — CORONARY ARTERY BYPASS GRAFTING (CABG)
Anesthesia: General | Site: Chest

## 2022-09-02 MED ORDER — NOREPINEPHRINE 4 MG/250ML-% IV SOLN
INTRAVENOUS | Status: DC | PRN
  Administered 2022-09-02: 6 ug/min via INTRAVENOUS

## 2022-09-02 MED ORDER — PANTOPRAZOLE SODIUM 40 MG PO TBEC
40.0000 mg | DELAYED_RELEASE_TABLET | Freq: Every day | ORAL | Status: DC
  Administered 2022-09-04 – 2022-09-07 (×4): 40 mg via ORAL
  Filled 2022-09-02 (×4): qty 1

## 2022-09-02 MED ORDER — ATORVASTATIN CALCIUM 40 MG PO TABS
40.0000 mg | ORAL_TABLET | Freq: Every day | ORAL | Status: DC
  Administered 2022-09-03 – 2022-09-07 (×5): 40 mg via ORAL
  Filled 2022-09-02 (×5): qty 1

## 2022-09-02 MED ORDER — DEXMEDETOMIDINE HCL IN NACL 400 MCG/100ML IV SOLN
0.0000 ug/kg/h | INTRAVENOUS | Status: DC
  Administered 2022-09-02: 0.7 ug/kg/h via INTRAVENOUS
  Filled 2022-09-02: qty 100

## 2022-09-02 MED ORDER — LEVOFLOXACIN IN D5W 750 MG/150ML IV SOLN
750.0000 mg | INTRAVENOUS | Status: AC
  Administered 2022-09-03: 750 mg via INTRAVENOUS
  Filled 2022-09-02: qty 150

## 2022-09-02 MED ORDER — ASPIRIN 81 MG PO CHEW: 324.0000 mg | CHEWABLE_TABLET | Freq: Every day | ORAL | Status: DC

## 2022-09-02 MED ORDER — SODIUM CHLORIDE 0.9 % IV SOLN: INTRAVENOUS | Status: DC

## 2022-09-02 MED ORDER — LACTATED RINGERS IV SOLN: INTRAVENOUS | Status: DC

## 2022-09-02 MED ORDER — HEPARIN SODIUM (PORCINE) 1000 UNIT/ML IJ SOLN
INTRAMUSCULAR | Status: AC
  Filled 2022-09-02: qty 1

## 2022-09-02 MED ORDER — VANCOMYCIN HCL 1000 MG IV SOLR
INTRAVENOUS | Status: DC | PRN
  Administered 2022-09-02: 1500 mg via INTRAVENOUS

## 2022-09-02 MED ORDER — CHLORHEXIDINE GLUCONATE CLOTH 2 % EX PADS
6.0000 | MEDICATED_PAD | Freq: Every day | CUTANEOUS | Status: DC
  Administered 2022-09-02 – 2022-09-06 (×5): 6 via TOPICAL

## 2022-09-02 MED ORDER — ONDANSETRON HCL 4 MG/2ML IJ SOLN
4.0000 mg | Freq: Four times a day (QID) | INTRAMUSCULAR | Status: DC | PRN
  Administered 2022-09-02 – 2022-09-06 (×4): 4 mg via INTRAVENOUS
  Filled 2022-09-02 (×4): qty 2

## 2022-09-02 MED ORDER — PHENYLEPHRINE HCL-NACL 20-0.9 MG/250ML-% IV SOLN
INTRAVENOUS | Status: DC | PRN
  Administered 2022-09-02: 10 ug/min via INTRAVENOUS

## 2022-09-02 MED ORDER — SODIUM CHLORIDE 0.9% IV SOLUTION: Freq: Once | INTRAVENOUS | Status: DC

## 2022-09-02 MED ORDER — ALBUMIN HUMAN 5 % IV SOLN: 250.0000 mL | INTRAVENOUS | Status: DC | PRN

## 2022-09-02 MED ORDER — EPINEPHRINE HCL 5 MG/250ML IV SOLN IN NS: 3.0000 ug/min | INTRAVENOUS | Status: DC

## 2022-09-02 MED ORDER — 0.9 % SODIUM CHLORIDE (POUR BTL) OPTIME
TOPICAL | Status: DC | PRN
  Administered 2022-09-02: 5000 mL

## 2022-09-02 MED ORDER — OXYCODONE HCL 5 MG PO TABS
5.0000 mg | ORAL_TABLET | ORAL | Status: DC | PRN
  Administered 2022-09-02 – 2022-09-05 (×12): 10 mg via ORAL
  Administered 2022-09-05: 5 mg via ORAL
  Administered 2022-09-06 – 2022-09-07 (×4): 10 mg via ORAL
  Filled 2022-09-02 (×15): qty 2
  Filled 2022-09-02: qty 1
  Filled 2022-09-02: qty 2

## 2022-09-02 MED ORDER — NITROGLYCERIN IN D5W 200-5 MCG/ML-% IV SOLN: 0.0000 ug/min | INTRAVENOUS | Status: DC

## 2022-09-02 MED ORDER — INSULIN REGULAR(HUMAN) IN NACL 100-0.9 UT/100ML-% IV SOLN
INTRAVENOUS | Status: DC
  Administered 2022-09-02: 1.8 [IU]/h via INTRAVENOUS

## 2022-09-02 MED ORDER — SODIUM CHLORIDE 0.9 % IV SOLN: INTRAVENOUS | Status: DC | PRN

## 2022-09-02 MED ORDER — FENTANYL CITRATE (PF) 250 MCG/5ML IJ SOLN
INTRAMUSCULAR | Status: AC
  Filled 2022-09-02: qty 5

## 2022-09-02 MED ORDER — DEXMEDETOMIDINE HCL IN NACL 200 MCG/50ML IV SOLN
INTRAVENOUS | Status: DC | PRN
  Administered 2022-09-02: .5 ug/kg/h via INTRAVENOUS

## 2022-09-02 MED ORDER — EPINEPHRINE HCL 5 MG/250ML IV SOLN IN NS
INTRAVENOUS | Status: DC | PRN
  Administered 2022-09-02: 2 ug/min via INTRAVENOUS

## 2022-09-02 MED ORDER — POTASSIUM CHLORIDE 10 MEQ/50ML IV SOLN
10.0000 meq | INTRAVENOUS | Status: AC
  Administered 2022-09-02 (×3): 10 meq via INTRAVENOUS

## 2022-09-02 MED ORDER — SODIUM BICARBONATE 8.4 % IV SOLN
100.0000 meq | Freq: Once | INTRAVENOUS | Status: AC
  Administered 2022-09-02: 100 meq via INTRAVENOUS

## 2022-09-02 MED ORDER — ALBUMIN HUMAN 5 % IV SOLN: INTRAVENOUS | Status: DC | PRN

## 2022-09-02 MED ORDER — DEXTROSE 50 % IV SOLN: 0.0000 mL | INTRAVENOUS | Status: DC | PRN

## 2022-09-02 MED ORDER — DOCUSATE SODIUM 100 MG PO CAPS
200.0000 mg | ORAL_CAPSULE | Freq: Every day | ORAL | Status: DC
  Administered 2022-09-03 – 2022-09-05 (×3): 200 mg via ORAL
  Filled 2022-09-02 (×4): qty 2

## 2022-09-02 MED ORDER — THROMBIN (RECOMBINANT) 20000 UNITS EX SOLR
CUTANEOUS | Status: AC
  Filled 2022-09-02: qty 20000

## 2022-09-02 MED ORDER — METOPROLOL TARTRATE 5 MG/5ML IV SOLN: 2.5000 mg | INTRAVENOUS | Status: DC | PRN

## 2022-09-02 MED ORDER — VASOPRESSIN 20 UNITS/100 ML INFUSION FOR SHOCK
0.0000 [IU]/min | INTRAVENOUS | Status: DC
  Administered 2022-09-02: .03 [IU]/min via INTRAVENOUS
  Administered 2022-09-02 – 2022-09-03 (×2): 0.03 [IU]/min via INTRAVENOUS
  Filled 2022-09-02 (×3): qty 100

## 2022-09-02 MED ORDER — PROPOFOL 10 MG/ML IV BOLUS
INTRAVENOUS | Status: DC | PRN
  Administered 2022-09-02: 50 mg via INTRAVENOUS

## 2022-09-02 MED ORDER — MIDAZOLAM HCL 5 MG/5ML IJ SOLN
INTRAMUSCULAR | Status: DC | PRN
  Administered 2022-09-02 (×2): 2 mg via INTRAVENOUS
  Administered 2022-09-02 (×2): 1 mg via INTRAVENOUS
  Administered 2022-09-02: 6 mg via INTRAVENOUS

## 2022-09-02 MED ORDER — PROTAMINE SULFATE 10 MG/ML IV SOLN
INTRAVENOUS | Status: DC | PRN
  Administered 2022-09-02: 350 mg via INTRAVENOUS

## 2022-09-02 MED ORDER — TRANEXAMIC ACID (OHS) BOLUS VIA INFUSION
15.0000 mg/kg | INTRAVENOUS | Status: AC
  Administered 2022-09-02: 1497 mg via INTRAVENOUS
  Filled 2022-09-02: qty 1497

## 2022-09-02 MED ORDER — MIDAZOLAM HCL (PF) 10 MG/2ML IJ SOLN
INTRAMUSCULAR | Status: AC
  Filled 2022-09-02: qty 2

## 2022-09-02 MED ORDER — HEPARIN SODIUM (PORCINE) 1000 UNIT/ML IJ SOLN
INTRAMUSCULAR | Status: DC | PRN
  Administered 2022-09-02: 37000 [IU] via INTRAVENOUS

## 2022-09-02 MED ORDER — METOPROLOL TARTRATE 25 MG/10 ML ORAL SUSPENSION
12.5000 mg | Freq: Two times a day (BID) | ORAL | Status: DC
Start: 2022-09-02 — End: 2022-09-02

## 2022-09-02 MED ORDER — BISACODYL 10 MG RE SUPP: 10.0000 mg | Freq: Every day | RECTAL | Status: DC

## 2022-09-02 MED ORDER — PLASMA-LYTE A IV SOLN: INTRAVENOUS | Status: DC | PRN

## 2022-09-02 MED ORDER — THROMBIN 20000 UNITS EX SOLR
OROMUCOSAL | Status: DC | PRN
  Administered 2022-09-02 (×3): 4 mL via TOPICAL

## 2022-09-02 MED ORDER — TRANEXAMIC ACID 1000 MG/10ML IV SOLN
INTRAVENOUS | Status: DC | PRN
  Administered 2022-09-02: 1.5 mg/kg/h via INTRAVENOUS

## 2022-09-02 MED ORDER — VASOPRESSIN 20 UNIT/ML IV SOLN
INTRAVENOUS | Status: DC | PRN
  Administered 2022-09-02: 2 [IU] via INTRAVENOUS
  Administered 2022-09-02: 1 [IU] via INTRAVENOUS

## 2022-09-02 MED ORDER — HEPARIN SODIUM (PORCINE) 1000 UNIT/ML IJ SOLN
INTRAMUSCULAR | Status: AC
  Filled 2022-09-02: qty 10

## 2022-09-02 MED ORDER — LACTATED RINGERS IV SOLN: INTRAVENOUS | Status: DC | PRN

## 2022-09-02 MED ORDER — VANCOMYCIN HCL IN DEXTROSE 1-5 GM/200ML-% IV SOLN
1000.0000 mg | Freq: Once | INTRAVENOUS | Status: AC
  Administered 2022-09-02: 1000 mg via INTRAVENOUS
  Filled 2022-09-02: qty 200

## 2022-09-02 MED ORDER — MIDAZOLAM HCL 2 MG/2ML IJ SOLN
INTRAMUSCULAR | Status: AC
  Filled 2022-09-02: qty 2

## 2022-09-02 MED ORDER — ACETAMINOPHEN 500 MG PO TABS
1000.0000 mg | ORAL_TABLET | Freq: Four times a day (QID) | ORAL | Status: DC
  Administered 2022-09-02 – 2022-09-07 (×16): 1000 mg via ORAL
  Filled 2022-09-02 (×16): qty 2

## 2022-09-02 MED ORDER — HEMOSTATIC AGENTS (NO CHARGE) OPTIME
TOPICAL | Status: DC | PRN
  Administered 2022-09-02: 1 via TOPICAL

## 2022-09-02 MED ORDER — MIDAZOLAM HCL 2 MG/2ML IJ SOLN
2.0000 mg | INTRAMUSCULAR | Status: DC | PRN
  Administered 2022-09-02 (×2): 2 mg via INTRAVENOUS
  Filled 2022-09-02 (×2): qty 2

## 2022-09-02 MED ORDER — TRAMADOL HCL 50 MG PO TABS
50.0000 mg | ORAL_TABLET | ORAL | Status: DC | PRN
  Administered 2022-09-02 – 2022-09-05 (×8): 100 mg via ORAL
  Filled 2022-09-02 (×8): qty 2

## 2022-09-02 MED ORDER — PHENYLEPHRINE HCL-NACL 20-0.9 MG/250ML-% IV SOLN: 0.0000 ug/min | INTRAVENOUS | Status: DC

## 2022-09-02 MED ORDER — PROPOFOL 10 MG/ML IV BOLUS
INTRAVENOUS | Status: AC
  Filled 2022-09-02: qty 20

## 2022-09-02 MED ORDER — SUCCINYLCHOLINE CHLORIDE 200 MG/10ML IV SOSY
PREFILLED_SYRINGE | INTRAVENOUS | Status: DC | PRN
  Administered 2022-09-02: 140 mg via INTRAVENOUS

## 2022-09-02 MED ORDER — VASOPRESSIN 20 UNIT/ML IV SOLN
INTRAVENOUS | Status: AC
  Filled 2022-09-02: qty 1

## 2022-09-02 MED ORDER — CHLORHEXIDINE GLUCONATE 0.12 % MT SOLN: 15.0000 mL | OROMUCOSAL | Status: AC

## 2022-09-02 MED ORDER — INSULIN REGULAR(HUMAN) IN NACL 100-0.9 UT/100ML-% IV SOLN
INTRAVENOUS | Status: DC | PRN
  Administered 2022-09-02: 2.8 [IU]/h via INTRAVENOUS

## 2022-09-02 MED ORDER — MORPHINE SULFATE (PF) 2 MG/ML IV SOLN
1.0000 mg | INTRAVENOUS | Status: DC | PRN
  Administered 2022-09-02: 2 mg via INTRAVENOUS
  Administered 2022-09-02 (×6): 4 mg via INTRAVENOUS
  Administered 2022-09-03 – 2022-09-05 (×2): 2 mg via INTRAVENOUS
  Filled 2022-09-02 (×3): qty 1
  Filled 2022-09-02 (×6): qty 2

## 2022-09-02 MED ORDER — SODIUM CHLORIDE 0.9 % IV SOLN: 250.0000 mL | INTRAVENOUS | Status: DC

## 2022-09-02 MED ORDER — PLASMA-LYTE A IV SOLN
INTRAVENOUS | Status: DC
  Filled 2022-09-02: qty 2.5

## 2022-09-02 MED ORDER — METHIMAZOLE 10 MG PO TABS
10.0000 mg | ORAL_TABLET | Freq: Every day | ORAL | Status: DC
  Administered 2022-09-03 – 2022-09-07 (×5): 10 mg via ORAL
  Filled 2022-09-02 (×5): qty 1

## 2022-09-02 MED ORDER — BISACODYL 5 MG PO TBEC
10.0000 mg | DELAYED_RELEASE_TABLET | Freq: Every day | ORAL | Status: DC
  Administered 2022-09-03 – 2022-09-05 (×3): 10 mg via ORAL
  Filled 2022-09-02 (×3): qty 2

## 2022-09-02 MED ORDER — FENTANYL CITRATE (PF) 250 MCG/5ML IJ SOLN
INTRAMUSCULAR | Status: DC | PRN
  Administered 2022-09-02 (×2): 100 ug via INTRAVENOUS
  Administered 2022-09-02: 150 ug via INTRAVENOUS
  Administered 2022-09-02 (×4): 100 ug via INTRAVENOUS
  Administered 2022-09-02 (×3): 50 ug via INTRAVENOUS

## 2022-09-02 MED ORDER — ACETAMINOPHEN 160 MG/5ML PO SOLN: 650.0000 mg | Freq: Once | ORAL | Status: DC

## 2022-09-02 MED ORDER — MAGNESIUM SULFATE 4 GM/100ML IV SOLN
4.0000 g | Freq: Once | INTRAVENOUS | Status: AC
  Administered 2022-09-02: 4 g via INTRAVENOUS
  Filled 2022-09-02: qty 100

## 2022-09-02 MED ORDER — SODIUM CHLORIDE 0.9% FLUSH
3.0000 mL | Freq: Two times a day (BID) | INTRAVENOUS | Status: DC
  Administered 2022-09-03 – 2022-09-06 (×7): 3 mL via INTRAVENOUS

## 2022-09-02 MED ORDER — ASPIRIN 325 MG PO TBEC
325.0000 mg | DELAYED_RELEASE_TABLET | Freq: Every day | ORAL | Status: DC
  Administered 2022-09-03 – 2022-09-06 (×4): 325 mg via ORAL
  Filled 2022-09-02 (×4): qty 1

## 2022-09-02 MED ORDER — LEVOFLOXACIN IN D5W 500 MG/100ML IV SOLN
INTRAVENOUS | Status: DC | PRN
  Administered 2022-09-02: 500 mg via INTRAVENOUS

## 2022-09-02 MED ORDER — FAMOTIDINE IN NACL 20-0.9 MG/50ML-% IV SOLN
20.0000 mg | Freq: Two times a day (BID) | INTRAVENOUS | Status: AC
  Administered 2022-09-02 (×2): 20 mg via INTRAVENOUS
  Filled 2022-09-02 (×2): qty 50

## 2022-09-02 MED ORDER — SODIUM CHLORIDE 0.9% FLUSH: 3.0000 mL | INTRAVENOUS | Status: DC | PRN

## 2022-09-02 MED ORDER — NOREPINEPHRINE 4 MG/250ML-% IV SOLN
0.0000 ug/min | INTRAVENOUS | Status: DC
  Administered 2022-09-03: 2 ug/min via INTRAVENOUS
  Filled 2022-09-02: qty 250

## 2022-09-02 MED ORDER — ACETAMINOPHEN 650 MG RE SUPP: 650.0000 mg | Freq: Once | RECTAL | Status: DC

## 2022-09-02 MED ORDER — SODIUM CHLORIDE 0.45 % IV SOLN: INTRAVENOUS | Status: DC | PRN

## 2022-09-02 MED ORDER — ROCURONIUM BROMIDE 10 MG/ML (PF) SYRINGE
PREFILLED_SYRINGE | INTRAVENOUS | Status: DC | PRN
  Administered 2022-09-02: 40 mg via INTRAVENOUS
  Administered 2022-09-02 (×3): 50 mg via INTRAVENOUS
  Administered 2022-09-02: 60 mg via INTRAVENOUS

## 2022-09-02 MED ORDER — ACETAMINOPHEN 160 MG/5ML PO SOLN: 1000.0000 mg | Freq: Four times a day (QID) | ORAL | Status: DC

## 2022-09-02 MED ORDER — PHENYLEPHRINE 80 MCG/ML (10ML) SYRINGE FOR IV PUSH (FOR BLOOD PRESSURE SUPPORT)
PREFILLED_SYRINGE | INTRAVENOUS | Status: DC | PRN
  Administered 2022-09-02: 40 ug via INTRAVENOUS
  Administered 2022-09-02: 160 ug via INTRAVENOUS

## 2022-09-02 MED ORDER — LACTATED RINGERS IV SOLN: 500.0000 mL | Freq: Once | INTRAVENOUS | Status: DC | PRN

## 2022-09-02 MED ORDER — METOPROLOL TARTRATE 12.5 MG HALF TABLET
12.5000 mg | ORAL_TABLET | Freq: Two times a day (BID) | ORAL | Status: DC
Start: 2022-09-02 — End: 2022-09-02

## 2022-09-02 SURGICAL SUPPLY — 102 items
BAG DECANTER FOR FLEXI CONT (MISCELLANEOUS) ×2 IMPLANT
BLADE CLIPPER SURG (BLADE) ×2 IMPLANT
BLADE STERNUM SYSTEM 6 (BLADE) ×2 IMPLANT
BNDG ELASTIC 4X5.8 VLCR STR LF (GAUZE/BANDAGES/DRESSINGS) ×2 IMPLANT
BNDG ELASTIC 6X5.8 VLCR STR LF (GAUZE/BANDAGES/DRESSINGS) ×2 IMPLANT
BNDG GAUZE DERMACEA FLUFF 4 (GAUZE/BANDAGES/DRESSINGS) ×2 IMPLANT
CANISTER SUCT 3000ML PPV (MISCELLANEOUS) ×2 IMPLANT
CANNULA ARTERIAL NVNT 3/8 20FR (MISCELLANEOUS) IMPLANT
CANNULA MC2 2 STG 36/46 NON-V (CANNULA) IMPLANT
CANNULA VESSEL 3MM BLUNT TIP (CANNULA) IMPLANT
CATH ROBINSON RED A/P 18FR (CATHETERS) ×4 IMPLANT
CATH THORACIC 28FR (CATHETERS) ×2 IMPLANT
CATH THORACIC 36FR (CATHETERS) ×2 IMPLANT
CATH THORACIC 36FR RT ANG (CATHETERS) ×2 IMPLANT
CLIP TI MEDIUM 24 (CLIP) IMPLANT
CLIP TI WIDE RED SMALL 24 (CLIP) IMPLANT
CONTAINER PROTECT SURGISLUSH (MISCELLANEOUS) ×4 IMPLANT
DRAPE CARDIOVASCULAR INCISE (DRAPES) ×2
DRAPE SRG 135X102X78XABS (DRAPES) ×2 IMPLANT
DRAPE WARM FLUID 44X44 (DRAPES) ×2 IMPLANT
DRSG COVADERM 4X14 (GAUZE/BANDAGES/DRESSINGS) ×2 IMPLANT
ELECT CAUTERY BLADE 6.4 (BLADE) ×2 IMPLANT
ELECT REM PT RETURN 9FT ADLT (ELECTROSURGICAL) ×4
ELECTRODE REM PT RTRN 9FT ADLT (ELECTROSURGICAL) ×4 IMPLANT
FELT TEFLON 1X6 (MISCELLANEOUS) ×2 IMPLANT
GAUZE 4X4 16PLY ~~LOC~~+RFID DBL (SPONGE) IMPLANT
GAUZE SPONGE 4X4 12PLY STRL (GAUZE/BANDAGES/DRESSINGS) ×4 IMPLANT
GAUZE SPONGE 4X4 12PLY STRL LF (GAUZE/BANDAGES/DRESSINGS) IMPLANT
GLOVE BIO SURGEON STRL SZ 6 (GLOVE) IMPLANT
GLOVE BIO SURGEON STRL SZ 6.5 (GLOVE) IMPLANT
GLOVE BIO SURGEON STRL SZ7 (GLOVE) IMPLANT
GLOVE BIO SURGEON STRL SZ7.5 (GLOVE) IMPLANT
GLOVE BIOGEL PI IND STRL 6 (GLOVE) IMPLANT
GLOVE BIOGEL PI IND STRL 6.5 (GLOVE) IMPLANT
GLOVE BIOGEL PI IND STRL 7.0 (GLOVE) IMPLANT
GLOVE ECLIPSE 7.0 STRL STRAW (GLOVE) ×4 IMPLANT
GLOVE ORTHO TXT STRL SZ7.5 (GLOVE) IMPLANT
GOWN STRL REUS W/ TWL LRG LVL3 (GOWN DISPOSABLE) ×8 IMPLANT
GOWN STRL REUS W/ TWL XL LVL3 (GOWN DISPOSABLE) ×2 IMPLANT
GOWN STRL REUS W/TWL LRG LVL3 (GOWN DISPOSABLE) ×12
GOWN STRL REUS W/TWL XL LVL3 (GOWN DISPOSABLE) ×2
HEMOSTAT POWDER SURGIFOAM 1G (HEMOSTASIS) ×6 IMPLANT
HEMOSTAT SURGICEL 2X14 (HEMOSTASIS) ×2 IMPLANT
INSERT FOGARTY 61MM (MISCELLANEOUS) IMPLANT
INSERT FOGARTY XLG (MISCELLANEOUS) IMPLANT
KIT BASIN OR (CUSTOM PROCEDURE TRAY) ×2 IMPLANT
KIT SUCTION CATH 14FR (SUCTIONS) ×2 IMPLANT
KIT TURNOVER KIT B (KITS) ×2 IMPLANT
KIT VASOVIEW HEMOPRO 2 VH 4000 (KITS) ×2 IMPLANT
KNIFE MICRO-UNI 3.5 30 DEG (BLADE) ×2 IMPLANT
NS IRRIG 1000ML POUR BTL (IV SOLUTION) ×10 IMPLANT
PACK E OPEN HEART (SUTURE) ×2 IMPLANT
PACK OPEN HEART (CUSTOM PROCEDURE TRAY) ×2 IMPLANT
PAD ARMBOARD 7.5X6 YLW CONV (MISCELLANEOUS) ×4 IMPLANT
PAD ELECT DEFIB RADIOL ZOLL (MISCELLANEOUS) ×2 IMPLANT
PENCIL BUTTON HOLSTER BLD 10FT (ELECTRODE) ×2 IMPLANT
POSITIONER HEAD DONUT 9IN (MISCELLANEOUS) ×2 IMPLANT
PUNCH AORTIC ROTATE 4.0MM (MISCELLANEOUS) IMPLANT
PUNCH AORTIC ROTATE 4.5MM 8IN (MISCELLANEOUS) ×2 IMPLANT
PUNCH AORTIC ROTATE 5MM 8IN (MISCELLANEOUS) IMPLANT
SET MPS 3-ND DEL (MISCELLANEOUS) IMPLANT
SPONGE INTESTINAL PEANUT (DISPOSABLE) IMPLANT
SPONGE T-LAP 18X18 ~~LOC~~+RFID (SPONGE) IMPLANT
SPONGE T-LAP 4X18 ~~LOC~~+RFID (SPONGE) IMPLANT
SUPPORT HEART JANKE-BARRON (MISCELLANEOUS) ×2 IMPLANT
SUT BONE WAX W31G (SUTURE) ×2 IMPLANT
SUT MNCRL AB 4-0 PS2 18 (SUTURE) IMPLANT
SUT PROLENE 3 0 SH DA (SUTURE) IMPLANT
SUT PROLENE 3 0 SH1 36 (SUTURE) ×2 IMPLANT
SUT PROLENE 4 0 RB 1 (SUTURE) ×2
SUT PROLENE 4 0 SH DA (SUTURE) IMPLANT
SUT PROLENE 4-0 RB1 .5 CRCL 36 (SUTURE) IMPLANT
SUT PROLENE 5 0 C 1 36 (SUTURE) IMPLANT
SUT PROLENE 6 0 C 1 30 (SUTURE) IMPLANT
SUT PROLENE 7 0 BV 1 (SUTURE) IMPLANT
SUT PROLENE 7 0 BV1 MDA (SUTURE) ×2 IMPLANT
SUT PROLENE 8 0 BV175 6 (SUTURE) IMPLANT
SUT SILK  1 MH (SUTURE)
SUT SILK 1 MH (SUTURE) IMPLANT
SUT SILK 2 0 SH (SUTURE) IMPLANT
SUT STEEL STERNAL CCS#1 18IN (SUTURE) IMPLANT
SUT STEEL SZ 6 DBL 3X14 BALL (SUTURE) IMPLANT
SUT VIC AB 1 CTX 36 (SUTURE) ×4
SUT VIC AB 1 CTX36XBRD ANBCTR (SUTURE) ×4 IMPLANT
SUT VIC AB 2-0 CT1 27 (SUTURE)
SUT VIC AB 2-0 CT1 TAPERPNT 27 (SUTURE) IMPLANT
SUT VIC AB 2-0 CTX 27 (SUTURE) IMPLANT
SUT VIC AB 3-0 SH 27 (SUTURE)
SUT VIC AB 3-0 SH 27X BRD (SUTURE) IMPLANT
SUT VIC AB 3-0 X1 27 (SUTURE) IMPLANT
SUT VICRYL 4-0 PS2 18IN ABS (SUTURE) IMPLANT
SYR 20CC LL (SYRINGE) IMPLANT
SYSTEM SAHARA CHEST DRAIN ATS (WOUND CARE) ×2 IMPLANT
TAPE CLOTH SURG 4X10 WHT LF (GAUZE/BANDAGES/DRESSINGS) IMPLANT
TAPE PAPER 2X10 WHT MICROPORE (GAUZE/BANDAGES/DRESSINGS) IMPLANT
TOWEL GREEN STERILE (TOWEL DISPOSABLE) ×2 IMPLANT
TOWEL GREEN STERILE FF (TOWEL DISPOSABLE) ×2 IMPLANT
TRAY FOLEY SLVR 16FR TEMP STAT (SET/KITS/TRAYS/PACK) ×2 IMPLANT
TUBE SUCT INTRACARD DLP 20F (MISCELLANEOUS) IMPLANT
TUBING LAP HI FLOW INSUFFLATIO (TUBING) ×2 IMPLANT
UNDERPAD 30X36 HEAVY ABSORB (UNDERPADS AND DIAPERS) ×2 IMPLANT
WATER STERILE IRR 1000ML POUR (IV SOLUTION) ×4 IMPLANT

## 2022-09-02 NOTE — Progress Notes (Signed)
     [address]       [name] [address]             [phone]       EVENING ROUNDS  POD #0 SP LIMA to LAD On multiple drips with pre EF 20% Not bleeding Extubated Having difficulty swallowing. Follow for  now

## 2022-09-02 NOTE — Progress Notes (Signed)
  Echocardiogram Echocardiogram Transesophageal has been performed.  Janalyn Harder 09/02/2022, 8:45 AM

## 2022-09-02 NOTE — Anesthesia Procedure Notes (Signed)
Arterial Line Insertion Start/End5/30/2024 6:50 AM, 09/02/2022 7:00 AM Performed by: Marcene Duos, MD, Garritt Molyneux Rochele Raring, CRNA, CRNA  Patient location: Pre-op. Preanesthetic checklist: patient identified, IV checked, site marked, risks and benefits discussed, surgical consent, monitors and equipment checked, pre-op evaluation, timeout performed and anesthesia consent Lidocaine 1% used for infiltration and patient sedated Right, radial was placed Catheter size: 20 G Hand hygiene performed  and maximum sterile barriers used  Allen's test indicative of satisfactory collateral circulation Attempts: 1 Procedure performed without using ultrasound guided technique. Following insertion, dressing applied and Biopatch. Post procedure assessment: normal and unchanged  Patient tolerated the procedure well with no immediate complications.

## 2022-09-02 NOTE — Anesthesia Preprocedure Evaluation (Signed)
Anesthesia Evaluation  Patient identified by MRN, date of birth, ID band Patient awake    Reviewed: Allergy & Precautions, NPO status , Patient's Chart, lab work & pertinent test results  Airway Mallampati: III  TM Distance: >3 FB Neck ROM: Full    Dental  (+) Dental Advisory Given   Pulmonary asthma , Current Smoker   breath sounds clear to auscultation       Cardiovascular + Past MI   Rhythm:Regular Rate:Normal     Neuro/Psych  Headaches PSYCHIATRIC DISORDERS         GI/Hepatic ,GERD  ,,(+)     substance abuse  cocaine use  Endo/Other  negative endocrine ROS    Renal/GU negative Renal ROS     Musculoskeletal  (+) Arthritis ,    Abdominal   Peds  Hematology negative hematology ROS (+)   Anesthesia Other Findings   Reproductive/Obstetrics                             Anesthesia Physical Anesthesia Plan  ASA: 4  Anesthesia Plan: General   Post-op Pain Management: Tylenol PO (pre-op)*   Induction: Intravenous  PONV Risk Score and Plan: 2 and Dexamethasone, Ondansetron, Midazolam and Treatment may vary due to age or medical condition  Airway Management Planned: Oral ETT  Additional Equipment: Arterial line, CVP, PA Cath and TEE  Intra-op Plan:   Post-operative Plan: Extubation in OR  Informed Consent: I have reviewed the patients History and Physical, chart, labs and discussed the procedure including the risks, benefits and alternatives for the proposed anesthesia with the patient or authorized representative who has indicated his/her understanding and acceptance.     Dental advisory given  Plan Discussed with: CRNA  Anesthesia Plan Comments:        Anesthesia Quick Evaluation

## 2022-09-02 NOTE — Hospital Course (Addendum)
HPI:  The patient is a 37 year old woman with a history of bipolar disorder, tobacco abuse, recent cocaine use, prediabetes, hyperlipidemia, and GERD who reports developing substernal chest discomfort radiating down her left arm on Mother's Day.  Over the last couple weeks she has had recurrent episodes of chest discomfort that have been accelerating.  She presented to [facility] after developing severe chest pain that would not resolve.  She was found to have elevated troponin I of 317 which increased to 1163 yesterday morning therefore decreasing to 765 this morning.  Cardiac catheterization this morning showed a hazy and eccentric 90% ostial LAD stenosis concerning for acute plaque rupture.  Left circumflex had no significant disease.  The proximal RCA had about 40% proximal stenosis.  Left ventricular ejection fraction was about 30 to 35% with anterior wall hypokinesis/akinesis.  LVEDP was increased to 25.  The patient was started on nitroglycerin drip which has been titrated and she has remained pain-free.  Intra-aortic balloon pump was deferred due to her inability to lie flat and remains still.  She was transferred to [facility] late this afternoon for coronary bypass surgery tomorrow.  She is currently pain-free on nitroglycerin at 5.  Dr. Laneta Simmers reviewed the patient's diagnostic studies and determined she would benefit from surgical intervention. He reviewed the treatment options as well as the risks and benefits of surgery with the patient. Ms. Parham was agreeable to proceed with surgery.  Hospital Course: Ms. Remlinger was transferred and admitted to [facility] on 05/29. She remained in stable condition and was brought to the operating room on 09/02/22, She underwent CABG x 1 utilizing LIMA to LAD. She tolerated the procedure well and was transferred to the SICU in stable condition. Her drips were weaned as hemodynamics tolerated and beta blocker was held until vital signs were stable off  pressors. Swan ganz catheter and arterial line were removed without complication. She was transitioned from insulin drip to SSI. Chest tubes were left in due to moderate drainage. She was volume overload and diuresed accordingly.

## 2022-09-02 NOTE — Brief Op Note (Signed)
09/01/2022 - 09/02/2022  11:29 AM  PATIENT:  Elizabeth Spencer  37 y.o. female  PRE-OPERATIVE DIAGNOSIS:  CAD  POST-OPERATIVE DIAGNOSIS:  CAD  PROCEDURE:   CORONARY ARTERY BYPASS GRAFTING (CABG) X 1 USING LEFT INTERNAL MAMMARY ARTERY  TRANSESOPHAGEAL ECHOCARDIOGRAM  -LIMA to LAD  SURGEON:  Surgeon(s) and Role:    * Bartle, Payton Doughty, MD - Primary  PHYSICIAN ASSISTANT: Aloha Gell PA-C  ASSISTANTS: Virgilio Frees RNFA   ANESTHESIA:   general  EBL:  1690 mL   BLOOD ADMINISTERED:none  DRAINS:  Mediastinal and pleural tubes    LOCAL MEDICATIONS USED:  NONE  SPECIMEN:  No Specimen  DISPOSITION OF SPECIMEN:  N/A  COUNTS:  YES  DICTATION: .Dragon Dictation  PLAN OF CARE: Admit to inpatient   PATIENT DISPOSITION:  ICU - intubated and hemodynamically stable.   Delay start of Pharmacological VTE agent (>24hrs) due to surgical blood loss or risk of bleeding: yes

## 2022-09-02 NOTE — Progress Notes (Signed)
Patient extubated per rapid wean protocol. NIF -20, VC 1.2L. Positive cuff leak. RN at bedside. Placed on 4L nasal cannula. Patient able to vocalize. Vitals stable.

## 2022-09-02 NOTE — Interval H&P Note (Signed)
History and Physical Interval Note:  09/02/2022 6:59 AM  Elizabeth Spencer  has presented today for surgery, with the diagnosis of CAD.  The various methods of treatment have been discussed with the patient and family. After consideration of risks, benefits and other options for treatment, the patient has consented to  Procedure(s): CORONARY ARTERY BYPASS GRAFTING (CABG) (N/A) TRANSESOPHAGEAL ECHOCARDIOGRAM (N/A) as a surgical intervention.  The patient's history has been reviewed, patient examined, no change in status, stable for surgery.  I have reviewed the patient's chart and labs.  Questions were answered to the patient's satisfaction.     Alleen Borne

## 2022-09-02 NOTE — Progress Notes (Signed)
Chaplain rec'd [employer] from [employer] [facility] noting that pt's surgery is scheduled between 6-7 AM. Chaplain went to explain to pt that Adv Dir could not be completed pre-surgery due to a lack of witnesses; however, RN spoke to chaplain, stating that pt had not completed any portion of the Adv Dir. When asked if RN needed chaplain to speak to pt, RN declined, suggesting that overnight chaplain follow up.

## 2022-09-02 NOTE — Anesthesia Procedure Notes (Signed)
Central Venous Catheter Insertion Performed by: Marcene Duos, MD, anesthesiologist Start/End5/30/2024 7:40 AM, 09/02/2022 7:48 AM Patient location: Pre-op. Preanesthetic checklist: patient identified, IV checked, site marked, risks and benefits discussed, surgical consent, monitors and equipment checked, pre-op evaluation, timeout performed and anesthesia consent Position: Trendelenburg Lidocaine 1% used for infiltration and patient sedated Hand hygiene performed , maximum sterile barriers used  and Seldinger technique used Catheter size: 8.5 Fr Total catheter length 10. Central line was placed.Sheath introducer Swan type:thermodilution PA Cath depth:47 Procedure performed using ultrasound guided technique. Ultrasound Notes:anatomy identified, needle tip was noted to be adjacent to the nerve/plexus identified, no ultrasound evidence of intravascular and/or intraneural injection and image(s) printed for medical record Attempts: 1 Following insertion, line sutured, dressing applied and Biopatch. Post procedure assessment: blood return through all ports, free fluid flow and no air  Patient tolerated the procedure well with no immediate complications.

## 2022-09-02 NOTE — Anesthesia Procedure Notes (Signed)
Central Venous Catheter Insertion Performed by: Marcene Duos, MD, anesthesiologist Start/End5/30/2024 7:45 AM, 09/02/2022 7:48 AM Patient location: Pre-op. Preanesthetic checklist: patient identified, IV checked, site marked, risks and benefits discussed, surgical consent, monitors and equipment checked, pre-op evaluation, timeout performed and anesthesia consent Hand hygiene performed  and maximum sterile barriers used  PA cath was placed.Swan type:thermodilution PA Cath depth:47 Procedure performed without using ultrasound guided technique. Attempts: 1 Patient tolerated the procedure well with no immediate complications.

## 2022-09-02 NOTE — Op Note (Signed)
CARDIOVASCULAR SURGERY OPERATIVE NOTE  09/02/2022  Surgeon:  Alleen Borne, MD  First Assistant: Aloha Gell,  PA-C:   An experienced assistant was required given the complexity of this surgery and the standard of surgical care. The assistant was needed for exposure, dissection, suctioning, retraction of delicate tissues and sutures, instrument exchange and for overall help during this procedure.   Preoperative Diagnosis:  Severe single-vessel coronary artery disease   Postoperative Diagnosis:  Same   Procedure:  Median Sternotomy Extracorporeal circulation 3.   Coronary artery bypass grafting x 1  Left internal mammary artery graft to the LAD     Anesthesia:  General Endotracheal   Clinical History/Surgical Indication:  This 37 year old woman has high-grade ostial LAD stenosis presenting with a 3-week history of recurrent chest discomfort and NSTEMI.  Echo shows a reduced ejection fraction of 30 to 35% with global hypokinesis.  I agree that coronary bypass graft surgery is indicated for relief of her symptoms and to prevent further myocardial loss.  This will require a LIMA graft to the LAD. I discussed the operative procedure with the patient  including alternatives, benefits and risks; including but not limited to bleeding, blood transfusion, infection, stroke, myocardial infarction, graft failure, heart block requiring a permanent pacemaker, organ dysfunction, and death.  Elizabeth Spencer understands and agrees to proceed.   Preparation:  The patient was seen in the preoperative holding area and the correct patient, correct operation were confirmed with the patient after reviewing the medical record and catheterization. The consent was signed by me. Preoperative antibiotics were given. A pulmonary arterial line and radial arterial line were placed by the anesthesia team. The patient  was taken back to the operating room and positioned supine on the operating room table. After being placed under general endotracheal anesthesia by the anesthesia team a foley catheter was placed. The neck, chest, abdomen, and both legs were prepped with betadine soap and solution and draped in the usual sterile manner. A surgical time-out was taken and the correct patient and operative procedure were confirmed with the nursing and anesthesia staff.   Cardiopulmonary Bypass:  A median sternotomy was performed. The pericardium was opened in the midline. Right ventricular function appeared normal. The ascending aorta was of normal size and had no palpable plaque. There were no contraindications to aortic cannulation or cross-clamping. The patient was fully systemically heparinized and the ACT was maintained > 400 sec. The proximal aortic arch was cannulated with a 20 F aortic cannula for arterial inflow. Venous cannulation was performed via the right atrial appendage using a two-staged venous cannula. An antegrade cardioplegia/vent cannula was inserted into the mid-ascending aorta. Aortic occlusion was performed with a single cross-clamp. Systemic temperature was maintained at 36 degrees.  Topical cooling of the heart with iced saline were used. Hyperkalemic antegrade cold blood cardioplegia was used to induce diastolic arrest.  A temperature probe was inserted into the interventricular septum and an insulating pad was placed in the pericardium.   Left internal mammary artery harvest:  The left side of the sternum was retracted using the Rultract retractor. The left internal mammary artery was harvested as a pedicle graft. All side branches were clipped. It was a medium-sized vessel of good quality with excellent blood flow. It was ligated distally and divided. It was sprayed with topical papaverine solution to prevent vasospasm.   Coronary arteries:  The coronary arteries were examined.  LAD:  Large  vessel with no distal disease LCX:  No disease RCA:  No palpable disease. Mild proximal narrowing on cath.   Grafts:  LIMA to the LAD: 2.5 mm. It was sewn end to side using 8-0 prolene continuous suture.    Completion:  The patient was rewarmed to 37 degrees Centigrade. The clamp was removed from the LIMA pedicle and there was rapid warming of the septum and return of ventricular fibrillation. The crossclamp was removed with a time of 27 minutes. There was spontaneous return of sinus rhythm. The distal and proximal anastomoses were checked for hemostasis. The position of the grafts was satisfactory. Two temporary epicardial pacing wires were placed on the right atrium and two on the right ventricle. The patient was weaned from CPB without difficulty on no inotropes. CPB time was 43 minutes. Cardiac output was 5 LPM. TEE showed unchanged LVEF of 25%. Heparin was fully reversed with protamine and the aortic and venous cannulas removed. Hemostasis was achieved. Mediastinal and left pleural drainage tubes were placed. The sternum was closed with double #6 stainless steel wires. The fascia was closed with continuous # 1 vicryl suture. The subcutaneous tissue was closed with 2-0 vicryl continuous suture. The skin was closed with 3-0 vicryl subcuticular suture. All sponge, needle, and instrument counts were reported correct at the end of the case. Dry sterile dressings were placed over the incisions and around the chest tubes which were connected to pleurevac suction. The patient was then transported to the surgical intensive care unit in stable condition.

## 2022-09-02 NOTE — Transfer of Care (Signed)
Immediate Anesthesia Transfer of Care Note  Patient: Elizabeth Spencer  Procedure(s) Performed: CORONARY ARTERY BYPASS GRAFTING (CABG) X 1 USING LEFT INTERNAL MAMMARY ARTERY (Chest) TRANSESOPHAGEAL ECHOCARDIOGRAM  Patient Location: ICU  Anesthesia Type:General  Level of Consciousness: Patient remains intubated per anesthesia plan  Airway & Oxygen Therapy: Patient remains intubated per anesthesia plan and Patient placed on Ventilator (see vital sign flow sheet for setting)  Post-op Assessment: Report given to RN and Post -op Vital signs reviewed and stable  Post vital signs: Reviewed and stable  Last Vitals:  Vitals Value Taken Time  BP 106/62 1238  Temp    Pulse 103 1238  Resp    SpO2 92 1239    Last Pain:  Vitals:   09/02/22 0326  TempSrc: Axillary  PainSc:       Patients Stated Pain Goal: 3 (09/01/22 2320)  Complications: No notable events documented.

## 2022-09-02 NOTE — Discharge Instructions (Signed)

## 2022-09-02 NOTE — Anesthesia Postprocedure Evaluation (Signed)
Anesthesia Post Note  Patient: Elizabeth Spencer  Procedure(s) Performed: CORONARY ARTERY BYPASS GRAFTING (CABG) X 1 USING LEFT INTERNAL MAMMARY ARTERY (Chest) TRANSESOPHAGEAL ECHOCARDIOGRAM     Patient location during evaluation: SICU Anesthesia Type: General Level of consciousness: sedated Pain management: pain level controlled Vital Signs Assessment: post-procedure vital signs reviewed and stable Respiratory status: patient remains intubated per anesthesia plan Cardiovascular status: stable Postop Assessment: no apparent nausea or vomiting Anesthetic complications: no  No notable events documented.  Last Vitals:  Vitals:   09/02/22 1330 09/02/22 1345  BP: 105/66   Pulse: (!) 106 (!) 119  Resp: 17 (!) 26  Temp: 36.8 C 36.8 C  SpO2: 97% 97%    Last Pain:  Vitals:   09/02/22 0326  TempSrc: Axillary  PainSc:                  Kennieth Rad

## 2022-09-02 NOTE — Progress Notes (Signed)
Patient yelling and cussing at staff, saying "all I want is some damn water, you guys are starving me and I'm dying of thirst". This RN explained to patient importance of prevention of aspiration pneumonia and the protocols in place that we have to follow for her safety. Attempted to complete post-extubation [facility] swallow screen, pt refusing to follow instructions and continuing to yell and cuss at staff. Will reattempt when able.

## 2022-09-02 NOTE — Anesthesia Procedure Notes (Signed)
Procedure Name: Intubation Date/Time: 09/02/2022 7:41 AM  Performed by: Margarita Rana, CRNAPre-anesthesia Checklist: Patient identified, Patient being monitored, Timeout performed, Emergency Drugs available and Suction available Patient Re-evaluated:Patient Re-evaluated prior to induction Oxygen Delivery Method: Circle System Utilized Preoxygenation: Pre-oxygenation with 100% oxygen Induction Type: IV induction Ventilation: Mask ventilation without difficulty Laryngoscope Size: Mac and 4 Grade View: Grade I Tube type: Oral Tube size: 8.0 mm Number of attempts: 1 Airway Equipment and Method: Stylet Placement Confirmation: ETT inserted through vocal cords under direct vision, positive ETCO2 and breath sounds checked- equal and bilateral Secured at: 22 cm Tube secured with: Tape Dental Injury: Teeth and Oropharynx as per pre-operative assessment

## 2022-09-03 ENCOUNTER — Encounter (HOSPITAL_COMMUNITY): Payer: Self-pay | Admitting: Surgery

## 2022-09-03 ENCOUNTER — Inpatient Hospital Stay (HOSPITAL_COMMUNITY): Payer: Medicaid Other

## 2022-09-03 LAB — BASIC METABOLIC PANEL
Anion gap: 9 (ref 5–15)
Anion gap: 12 (ref 5–15)
BUN: 7 mg/dL (ref 6–20)
BUN: 8 mg/dL (ref 6–20)
CO2: 23 mmol/L (ref 22–32)
CO2: 25 mmol/L (ref 22–32)
Calcium: 8.3 mg/dL — ABNORMAL LOW (ref 8.9–10.3)
Calcium: 8.9 mg/dL (ref 8.9–10.3)
Chloride: 102 mmol/L (ref 98–111)
Chloride: 99 mmol/L (ref 98–111)
Creatinine, Ser: 0.58 mg/dL (ref 0.44–1.00)
Creatinine, Ser: 0.64 mg/dL (ref 0.44–1.00)
GFR, Estimated: >60 mL/min (ref >60)
GFR, Estimated: >60 mL/min (ref >60)
Glucose, Bld: 119 mg/dL — ABNORMAL HIGH (ref 70–99)
Glucose, Bld: 95 mg/dL (ref 70–99)
Potassium: 4.2 mmol/L (ref 3.5–5.1)
Potassium: 4.3 mmol/L (ref 3.5–5.1)
Sodium: 134 mmol/L — ABNORMAL LOW (ref 135–145)
Sodium: 136 mmol/L (ref 135–145)

## 2022-09-03 LAB — BPAM RBC: ISSUE DATE / TIME: 202405110745

## 2022-09-03 LAB — CBC
HCT: 35 % — ABNORMAL LOW (ref 36.0–46.0)
HCT: 32.4 % — ABNORMAL LOW (ref 36.0–46.0)
Hemoglobin: 11.1 g/dL — ABNORMAL LOW (ref 12.0–15.0)
Hemoglobin: 10 g/dL — ABNORMAL LOW (ref 12.0–15.0)
MCH: 29.4 pg (ref 26.0–34.0)
MCH: 29.1 pg (ref 26.0–34.0)
MCHC: 31.7 g/dL (ref 30.0–36.0)
MCHC: 30.9 g/dL (ref 30.0–36.0)
MCV: 92.6 fL (ref 80.0–100.0)
MCV: 94.2 fL (ref 80.0–100.0)
Platelets: 135 10*3/uL — ABNORMAL LOW (ref 150–400)
Platelets: 125 10*3/uL — ABNORMAL LOW (ref 150–400)
RBC: 3.78 MIL/uL — ABNORMAL LOW (ref 3.87–5.11)
RBC: 3.44 MIL/uL — ABNORMAL LOW (ref 3.87–5.11)
RDW: 13 % (ref 11.5–15.5)
RDW: 13.2 % (ref 11.5–15.5)
WBC: 13.4 10*3/uL — ABNORMAL HIGH (ref 4.0–10.5)
WBC: 11.5 10*3/uL — ABNORMAL HIGH (ref 4.0–10.5)
nRBC: 0 % (ref 0.0–0.2)
nRBC: 0 % (ref 0.0–0.2)

## 2022-09-03 LAB — CULTURE, BLOOD (ROUTINE X 2)
Culture: NO GROWTH
Special Requests: ADEQUATE
Special Requests: ADEQUATE

## 2022-09-03 LAB — LIPOPROTEIN A (LPA): Lipoprotein (a): 15.7 nmol/L (ref <75.0)

## 2022-09-03 LAB — MAGNESIUM
Magnesium: 2.1 mg/dL (ref 1.7–2.4)
Magnesium: 1.9 mg/dL (ref 1.7–2.4)

## 2022-09-03 LAB — GLUCOSE, CAPILLARY
Glucose-Capillary: 147 mg/dL — ABNORMAL HIGH (ref 70–99)
Glucose-Capillary: 123 mg/dL — ABNORMAL HIGH (ref 70–99)
Glucose-Capillary: 127 mg/dL — ABNORMAL HIGH (ref 70–99)
Glucose-Capillary: 117 mg/dL — ABNORMAL HIGH (ref 70–99)
Glucose-Capillary: 113 mg/dL — ABNORMAL HIGH (ref 70–99)
Glucose-Capillary: 98 mg/dL (ref 70–99)
Glucose-Capillary: 32 mg/dL — CL (ref 70–99)
Glucose-Capillary: 84 mg/dL (ref 70–99)
Glucose-Capillary: 92 mg/dL (ref 70–99)
Glucose-Capillary: 88 mg/dL (ref 70–99)

## 2022-09-03 LAB — TYPE AND SCREEN

## 2022-09-03 MED ORDER — NICOTINE 21 MG/24HR TD PT24
21.0000 mg | MEDICATED_PATCH | Freq: Every day | TRANSDERMAL | Status: DC
  Filled 2022-09-03 (×4): qty 1

## 2022-09-03 MED ORDER — KETOROLAC TROMETHAMINE 15 MG/ML IJ SOLN
15.0000 mg | Freq: Four times a day (QID) | INTRAMUSCULAR | Status: DC | PRN
  Administered 2022-09-03 – 2022-09-06 (×6): 15 mg via INTRAVENOUS
  Filled 2022-09-03 (×6): qty 1

## 2022-09-03 MED ORDER — INSULIN ASPART 100 UNIT/ML IJ SOLN: 0.0000 [IU] | INTRAMUSCULAR | Status: DC

## 2022-09-03 MED ORDER — ENOXAPARIN SODIUM 40 MG/0.4ML IJ SOSY
40.0000 mg | PREFILLED_SYRINGE | Freq: Every day | INTRAMUSCULAR | Status: DC
  Administered 2022-09-03 – 2022-09-06 (×4): 40 mg via SUBCUTANEOUS
  Filled 2022-09-03 (×4): qty 0.4

## 2022-09-03 MED ORDER — ALPRAZOLAM 0.5 MG PO TABS
0.5000 mg | ORAL_TABLET | Freq: Three times a day (TID) | ORAL | Status: DC | PRN
  Administered 2022-09-03 – 2022-09-05 (×4): 0.5 mg via ORAL
  Filled 2022-09-03 (×4): qty 1

## 2022-09-03 MED FILL — Thrombin (Recombinant) For Soln 20000 Unit: CUTANEOUS | Qty: 1 | Status: AC

## 2022-09-03 NOTE — TOC Initial Note (Signed)
Transition of Care [facility]) - Initial/Assessment Note    Patient Details  Name: Elizabeth Spencer MRN: [id number] Date of Birth: January 12, 1986  [facility] CM/SW Contact:    Elliot Cousin, RN Phone Number: [phone] 09/03/2022, 3:49 PM  Clinical Narrative:   CM spoke to pt at bedside. Pt states she lives alone. Her SO visits but not in the home. Spoke to pt about support when she goes home. She was working on getting out of bed and explained CM will visit again to discuss dc needs.                 Expected Discharge Plan: Home/Self Care Barriers to Discharge: Continued Medical Work up   Patient Goals and CMS Choice Patient states their goals for this hospitalization and ongoing recovery are:: wants to go home     Expected Discharge Plan and Services   Discharge Planning Services: CM Consult   Living arrangements for the past 2 months: Apartment                  Prior Living Arrangements/Services Living arrangements for the past 2 months: Apartment Lives with:: Self Patient language and need for interpreter reviewed:: Yes Do you feel safe going back to the place where you live?: Yes        Care giver support system in place?: No (comment)   Criminal Activity/Legal Involvement Pertinent to Current Situation/Hospitalization: No - Comment as needed  Activities of Daily Living Home Assistive Devices/Equipment: None ADL Screening (condition at time of admission) Patient's cognitive ability adequate to safely complete daily activities?: Yes Is the patient deaf or have difficulty hearing?: No Does the patient have difficulty seeing, even when wearing glasses/contacts?: No Does the patient have difficulty concentrating, remembering, or making decisions?: No Patient able to express need for assistance with ADLs?: Yes Does the patient have difficulty dressing or bathing?: No Independently performs ADLs?: Yes (appropriate for developmental age) Does the patient have  difficulty walking or climbing stairs?: No Weakness of Legs: None Weakness of Arms/Hands: None  Permission Sought/Granted Permission sought to share information with : Case Manager, Family Supports, PCP Permission granted to share information with : Yes, Verbal Permission Granted  Share Information with NAME: Langston Masker     Permission granted to share info w Relationship: SO  Permission granted to share info w Contact Information: [phone]  Emotional Assessment Appearance:: Appears stated age Attitude/Demeanor/Rapport: Engaged Affect (typically observed): Accepting Orientation: : Oriented to Self, Oriented to Place, Oriented to  Time, Oriented to Situation   Psych Involvement: No (comment)  Admission diagnosis:  Chest pain [R07.9] S/P CABG x 1 [Z95.1] Patient Active Problem List   Diagnosis Date Noted   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   NSTEMI (non-ST elevated myocardial infarction) (HCC) 09/01/2022   Chest pain 08/31/2022   Gastroesophageal reflux disease 12/27/2019   Multinodular goiter 08/08/2018   Vaginal cuff dehiscence 11/05/2017   S/P laparoscopic hysterectomy 09/29/2017   Chronic tension-type headache, not intractable 05/30/2017   Migraine without aura and without status migrainosus, not intractable 05/30/2017   Arthropathy of lumbar facet joint 04/08/2017   Anxiety 11/08/2013   Obesity 11/08/2013   Bipolar affective disorder (HCC) 05/17/2013   Depression 08/18/2012   PCP:  Miki Kins, FNP Pharmacy:   [facility] - [place], [place] - [address] [place] [address] Phone: [phone] Fax: [phone]     Social Determinants of Health (SDOH) Social History:  SDOH Screenings   Food Insecurity: No Food Insecurity (09/01/2022)  Housing: Patient Unable To Answer (09/01/2022)  Transportation Needs: No Transportation Needs (09/01/2022)  Utilities: Not At Risk (09/01/2022)  Tobacco Use: High Risk (09/03/2022)    SDOH Interventions:     Readmission Risk Interventions     No data to display

## 2022-09-03 NOTE — Progress Notes (Signed)
1 Day Post-Op Procedure(s) (LRB): CORONARY ARTERY BYPASS GRAFTING (CABG) X 1 USING LEFT INTERNAL MAMMARY ARTERY (N/A) TRANSESOPHAGEAL ECHOCARDIOGRAM (N/A) Subjective: Complains of some chest wall pain and feels hot. Stable night overall. Refused to use IS.  Objective: Vital signs in last 24 hours: Temp:  [98.1 F (36.7 C)-99.5 F (37.5 C)] 98.2 F (36.8 C) (05/31 0615) Pulse Rate:  [103-126] 117 (05/31 0615) Cardiac Rhythm: Sinus tachycardia (05/31 0000) Resp:  [0-39] 20 (05/31 0615) BP: (83-137)/(34-95) 99/81 (05/31 0600) SpO2:  [83 %-99 %] 93 % (05/31 0615) Arterial Line BP: (56-134)/(45-82) 108/60 (05/31 0615) FiO2 (%):  [40 %-50 %] 40 % (05/30 1459) Weight:  [107.1 kg] 107.1 kg (05/31 0500)  Hemodynamic parameters for last 24 hours: PAP: (22-55)/(7-28) 31/22 CO:  [6.4 L/min-8.1 L/min] 8 L/min CI:  [3 L/min/m2-3.8 L/min/m2] 3.7 L/min/m2  Intake/Output from previous day: 05/30 0701 - 05/31 0700 In: 5553.5 [I.V.:3533.8; Blood:820; IV Piggyback:1199.8] Out: 1610 [RUEAV:4098; Blood:1690; Chest Tube:417] Intake/Output this shift: No intake/output data recorded.  General appearance: alert and cooperative Neurologic: intact Heart: regular rate and rhythm, S1, S2 normal, no murmur Lungs: clear to auscultation bilaterally Extremities: edema mild Wound: dressing dry Chest tubes with small air leak. Lab Results: Recent Labs    09/02/22 1811 09/03/22 0500  WBC 25.5* 13.4*  HGB 12.7 11.1*  HCT 39.5 35.0*  PLT 217 135*   BMET:  Recent Labs    09/02/22 1811 09/03/22 0500  NA 139 134*  K 4.3 4.2  CL 109 102  CO2 22 23  GLUCOSE 147* 119*  BUN 6 7  CREATININE 0.71 0.58  CALCIUM 8.1* 8.3*    PT/INR:  Recent Labs    09/02/22 1247  LABPROT 17.7*  INR 1.4*   ABG    Component Value Date/Time   PHART 7.364 09/02/2022 1701   HCO3 19.5 (L) 09/02/2022 1701   TCO2 21 (L) 09/02/2022 1701   ACIDBASEDEF 5.0 (H) 09/02/2022 1701   O2SAT 96 09/02/2022 1701   CBG  (last 3)  Recent Labs    09/03/22 0158 09/03/22 0259 09/03/22 0358  GLUCAP 123* 127* 117*   CXR: ok  ECG: sinus, no acute changes  Assessment/Plan: S/P Procedure(s) (LRB): CORONARY ARTERY BYPASS GRAFTING (CABG) X 1 USING LEFT INTERNAL MAMMARY ARTERY (N/A) TRANSESOPHAGEAL ECHOCARDIOGRAM (N/A)  POD 1  Hemodynamically stable overnight and off epi, NE. Still on vaso 0.03 and low dose neo. Wean as tolerated. Hold off on BB until stable off vasopressors. Preop EF 25-30%.  Keep chest tubes in. Will decrease suction to 10 cm.  DC swan, arterial line.  IS, OOB.  Nicotine patch for heavy smoking, xanax for anxiety. Add Toradol for pain.  Glucose under good control. Transition to SSI. Preop Hgb A1c normal.  Wt is only 5 lbs over preop. Diurese once off vasopressors.    LOS: 2 days    Alleen Borne 09/03/2022

## 2022-09-03 NOTE — Progress Notes (Signed)
      [address]       [place], [address]             [phone]      POD # 1  CABG x 1  Some incisional pain  BP 100/67   Pulse (!) 119   Temp 98.2 F (36.8 C)   Resp (!) 22   Ht 5\' 7"  (1.702 m)   Wt 107.1 kg   LMP 08/22/2017 Comment: says she just spots.  SpO2 94%   BMI 36.98 kg/m  HFNC 7 L 93% sat   Intake/Output Summary (Last 24 hours) at 09/03/2022 1717 Last data filed at 09/03/2022 1641 Gross per 24 hour  Intake 1945.53 ml  Output 1169 ml  Net 776.53 ml   Hypoglycemic at noon, normal currently  Viviann Spare C. Dorris Fetch, MD [facility] [phone]

## 2022-09-03 NOTE — Discharge Summary (Signed)
[address]       [place] [address]             [phone]    Physician Discharge Summary  Patient ID: Elizabeth Spencer MRN: [id number] DOB/AGE: 1985-10-26 37 y.o.  Admit date: 09/01/2022 Discharge date: 09/08/2022  Admission Diagnoses:  Patient Active Problem List   Diagnosis Date Noted   NSTEMI (non-ST elevated myocardial infarction) (HCC) 09/01/2022   Chest pain 08/31/2022   Gastroesophageal reflux disease 12/27/2019   Multinodular goiter 08/08/2018   Vaginal cuff dehiscence 11/05/2017   S/P laparoscopic hysterectomy 09/29/2017   Chronic tension-type headache, not intractable 05/30/2017   Migraine without aura and without status migrainosus, not intractable 05/30/2017   Arthropathy of lumbar facet joint 04/08/2017   Anxiety 11/08/2013   Obesity 11/08/2013   Bipolar affective disorder (HCC) 05/17/2013   Depression 08/18/2012     Discharge Diagnoses:  Patient Active Problem List   Diagnosis Date Noted   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   NSTEMI (non-ST elevated myocardial infarction) (HCC) 09/01/2022   Chest pain 08/31/2022   Gastroesophageal reflux disease 12/27/2019   Multinodular goiter 08/08/2018   Vaginal cuff dehiscence 11/05/2017   S/P laparoscopic hysterectomy 09/29/2017   Chronic tension-type headache, not intractable 05/30/2017   Migraine without aura and without status migrainosus, not intractable 05/30/2017   Arthropathy of lumbar facet joint 04/08/2017   Anxiety 11/08/2013   Obesity 11/08/2013   Bipolar affective disorder (HCC) 05/17/2013   Depression 08/18/2012     Discharged Condition: stable  HPI:  The patient is a 37 year old woman with a history of bipolar disorder, tobacco abuse, recent cocaine use, prediabetes, hyperlipidemia, and GERD who reports developing substernal chest discomfort radiating down her left arm on Mother's Day.  Over the last couple weeks she has had recurrent episodes of chest discomfort that have been  accelerating.  She presented to [facility] after developing severe chest pain that would not resolve.  She was found to have elevated troponin I of 317 which increased to 1163 yesterday morning therefore decreasing to 765 this morning.  Cardiac catheterization this morning showed a hazy and eccentric 90% ostial LAD stenosis concerning for acute plaque rupture.  Left circumflex had no significant disease.  The proximal RCA had about 40% proximal stenosis.  Left ventricular ejection fraction was about 30 to 35% with anterior wall hypokinesis/akinesis.  LVEDP was increased to 25.  The patient was started on nitroglycerin drip which has been titrated and she has remained pain-free.  Intra-aortic balloon pump was deferred due to her inability to lie flat and remains still.  She was transferred to [facility] late this afternoon for coronary bypass surgery tomorrow.  She is currently pain-free on nitroglycerin at 5.  Dr. Laneta Simmers reviewed the patient's diagnostic studies and determined she would benefit from surgical intervention. He reviewed the treatment options as well as the risks and benefits of surgery with the patient. [name] was agreeable to proceed with surgery.  Hospital Course: Ms. Lab was transferred and admitted to [facility] on 05/29. She remained in stable condition and was brought to the operating room on 09/02/22, She underwent CABG x 1 utilizing LIMA to LAD. She tolerated the procedure well and was transferred to the SICU in stable condition. Her drips were weaned as hemodynamics tolerated and beta blocker was held until vital signs were stable off pressors. Swan ganz catheter and arterial line were removed without complication. She was transitioned from insulin drip to  SSI. Chest tubes were left in due to moderate drainage. She was volume overload and diuresed accordingly.  She was started on Colchicine for suspected pericarditis.  Her chest tube was transitioned to water seal.  Her air  leak resolved and her chest tube was removed on 09/05/2022.  The patient refused ambulation and was educated on importance of mobility. She required 4-6L of HFNC, this was weaned as tolerated. Epicardial pacing wires were removed without complication. She was felt stable for transfer to the progressive unit. She was weaned to room air but was desaturating during ambulation, CXR showed bibasilar atelectasis and small left pleural effusion. Pulmonary hygiene was encouraged, Lasix was continued. She had some sinus tachycardia but blood pressure was too soft to titrate beta blocker. She was started on Plavix for hx of NSTEMI. She was ambulating well independently and began saturating well on RA with ambulation. She expressed her desire for a sleep apnea workup, an appointment was arranged with her PCP. Her incision was healing well without sign of infection. As discussed with Dr. Laneta Simmers she was felt stable to discharge home.  Consults: None  Significant Diagnostic Studies:  LEFT HEART CATH AND CORONARY ANGIOGRAPHY  Dominance: Right Left Main  Vessel is large. Vessel is angiographically normal.    Left Anterior Descending  Vessel is moderate in size.  Ost LAD lesion is 90% stenosed. The lesion is eccentric.    First Diagonal Branch  Vessel is moderate in size.    Second Diagonal Branch  Vessel is moderate in size.    Left Circumflex  Vessel is large.    First Obtuse Marginal Branch  Vessel is small in size.    Second Obtuse Marginal Branch  Vessel is small in size.    Third Obtuse Marginal Branch  Vessel is moderate in size.    Fourth Obtuse Marginal Branch  Vessel is small in size.    Right Coronary Artery  Vessel is large.  Prox RCA lesion is 40% stenosed.    Right Posterior Descending Artery  Vessel is large in size.    Right Posterior Atrioventricular Artery  Vessel is moderate in size.    First Right Posterolateral Branch  Vessel is small in size.    Second Right  Posterolateral Branch  Vessel is small in size.     ECHOCARDIOGRAM REPORT       Patient Name:   Elizabeth Spencer Date of Exam: 09/01/2022  Medical Rec #:  [id number]     Height:       67.0 in  Accession #:    [id number]    Weight:       220.0 lb  Date of Birth:  January 29, 1986      BSA:          2.106 m  Patient Age:    37 years      BP:           99/68 mmHg  Patient Gender: F             HR:           109 bpm.  Exam Location:  [facility]   Procedure: 2D Echo, Cardiac Doppler and Color Doppler   Indications:     Chest pain R07.9    History:         Patient has no prior history of Echocardiogram  examinations.    Sonographer:    Cristela Blue  Referring Phys:  [id number] CADENCE H FURTH  Diagnosing Phys: Arlys John Agbor-Etang  MD   IMPRESSIONS     1. Left ventricular ejection fraction, by estimation, is 30 to 35%. The  left ventricle has moderate to severely decreased function. The left  ventricle demonstrates global hypokinesis. There is mild left ventricular  hypertrophy. Left ventricular  diastolic parameters are consistent with Grade I diastolic dysfunction  (impaired relaxation).   2. Right ventricular systolic function is normal. The right ventricular  size is not well visualized.   3. The mitral valve is normal in structure. Mild mitral valve  regurgitation.   4. The aortic valve was not well visualized. Aortic valve regurgitation  is not visualized.   FINDINGS   Left Ventricle: Left ventricular ejection fraction, by estimation, is 30  to 35%. The left ventricle has moderate to severely decreased function.  The left ventricle demonstrates global hypokinesis. The left ventricular  internal cavity size was normal in  size. There is mild left ventricular hypertrophy. Left ventricular  diastolic parameters are consistent with Grade I diastolic dysfunction  (impaired relaxation).   Right Ventricle: The right ventricular size is not well visualized. No  increase in right ventricular wall  thickness. Right ventricular systolic  function is normal.   Left Atrium: Left atrial size was normal in size.   Right Atrium: Right atrial size was normal in size.   Pericardium: There is no evidence of pericardial effusion.   Mitral Valve: The mitral valve is normal in structure. Mild mitral valve  regurgitation.   Tricuspid Valve: The tricuspid valve is not well visualized. Tricuspid  valve regurgitation is not demonstrated.   Aortic Valve: The aortic valve was not well visualized. Aortic valve  regurgitation is not visualized. Aortic valve mean gradient measures 3.0  mmHg. Aortic valve peak gradient measures 6.0 mmHg. Aortic valve area, by  VTI measures 3.36 cm.   Pulmonic Valve: The pulmonic valve was not well visualized. Pulmonic valve  regurgitation is not visualized.   Aorta: The aortic root is normal in size and structure.   IAS/Shunts: No atrial level shunt detected by color flow Doppler.     LEFT VENTRICLE  PLAX 2D  LVIDd:         5.40 cm      Diastology  LVIDs:         4.50 cm      LV e' medial:   6.09 cm/s  LV PW:         1.10 cm      LV E/e' medial: 10.4  LV IVS:        1.20 cm  LVOT diam:     2.10 cm  LV SV:         55  LV SV Index:   26  LVOT Area:     3.46 cm    LV Volumes (MOD)  LV vol d, MOD A2C: 134.0 ml  LV vol d, MOD A4C: 151.0 ml  LV vol s, MOD A2C: 93.4 ml  LV vol s, MOD A4C: 114.0 ml  LV SV MOD A2C:     40.6 ml  LV SV MOD A4C:     151.0 ml  LV SV MOD BP:      38.2 ml   RIGHT VENTRICLE  RV Basal diam:  2.10 cm  RV Mid diam:    1.60 cm  RV S prime:     11.70 cm/s  TAPSE (M-mode): 2.0 cm   LEFT ATRIUM             Index  RIGHT ATRIUM          Index  LA diam:        4.00 cm 1.90 cm/m   RA Area:     5.70 cm  LA Vol (A2C):   53.0 ml 25.17 ml/m  RA Volume:   6.91 ml  3.28 ml/m  LA Vol (A4C):   29.9 ml 14.20 ml/m  LA Biplane Vol: 39.7 ml 18.85 ml/m   AORTIC VALVE  AV Area (Vmax):    2.45 cm  AV Area (Vmean):   2.63 cm  AV  Area (VTI):     3.36 cm  AV Vmax:           122.00 cm/s  AV Vmean:          80.000 cm/s  AV VTI:            0.164 m  AV Peak Grad:      6.0 mmHg  AV Mean Grad:      3.0 mmHg  LVOT Vmax:         86.20 cm/s  LVOT Vmean:        60.800 cm/s  LVOT VTI:          0.159 m  LVOT/AV VTI ratio: 0.97    AORTA  Ao Root diam: 3.20 cm   MITRAL VALVE               TRICUSPID VALVE  MV Area (PHT): 6.83 cm    TR Peak grad:   11.6 mmHg  MV Decel Time: 111 msec    TR Vmax:        170.00 cm/s  MV E velocity: 63.40 cm/s  MV A velocity: 96.90 cm/s  SHUNTS  MV E/A ratio:  0.65        Systemic VTI:  0.16 m                             Systemic Diam: 2.10 cm   Debbe Odea MD  Electronically signed by Debbe Odea MD  Signature Date/Time: 09/01/2022/4:32:46 PM      Final     Treatments: surgery:  CARDIOVASCULAR SURGERY OPERATIVE NOTE   09/02/2022   Surgeon:  Alleen Borne, MD   First Assistant: Aloha Gell,  PA-C:   An experienced assistant was required given the complexity of this surgery and the standard of surgical care. The assistant was needed for exposure, dissection, suctioning, retraction of delicate tissues and sutures, instrument exchange and for overall help during this procedure.   Preoperative Diagnosis:  Severe single-vessel coronary artery disease   Postoperative Diagnosis:  Same  Procedure:   Median Sternotomy Extracorporeal circulation 3.   Coronary artery bypass grafting x 1   Left internal mammary artery graft to the LAD   Discharge Exam: Blood pressure 93/72, pulse 78, temperature 98.4 F (36.9 C), temperature source Oral, resp. rate 20, height 5\' 7"  (1.702 m), weight 101.6 kg, last menstrual period 08/22/2017, SpO2 98 %. General appearance: alert, cooperative, and no distress Neurologic: intact Heart: regular rate and rhythm-sinus tachycardia, no murmur Lungs: Diminished bibasilar breath sounds Abdomen: soft, non-tender; bowel sounds normal; no masses,   no organomegaly Extremities: edema trace Wound: Clean and dry, no erythema or sign of infection  Discharge Medications:  The patient has been discharged on:   1.Beta Blocker:  Yes [  X ]  No   [   ]                              If No, reason:  2.Ace Inhibitor/ARB: Yes [   ]                                     No  [   X ]                                     If No, reason: Hypotension  3.Statin:   Yes [ X  ]                  No  [   ]                  If No, reason:  4.Ecasa:  Yes  [  X ]                  No   [   ]                  If No, reason:  Patient had ACS upon admission: Yes  Plavix/P2Y12 inhibitor: Yes [  X ]                                      No  [   ]     Discharge Instructions     Amb Referral to Cardiac Rehabilitation   Complete by: As directed    Diagnosis: CABG   CABG X ___: 1   After initial evaluation and assessments completed: Virtual Based Care may be provided alone or in conjunction with Phase 2 Cardiac Rehab based on patient barriers.: Yes   Intensive Cardiac Rehabilitation (ICR) [facility] location only OR Traditional Cardiac Rehabilitation (TCR) *If criteria for ICR are not met will enroll in TCR [facility] only): Yes      Allergies as of 09/07/2022       Reactions   Azithromycin Swelling, Other (See Comments)   Clindamycin Swelling   Codeine Hives, Other (See Comments)   Nausea/dizzy Nausea/dizzy   Meloxicam Other (See Comments)   Burns up from the inside   Neurontin [gabapentin] Other (See Comments)   Makes her pass out and not remember what happened prior to taking med.   Penicillins Anaphylaxis   Patient allergic to all "cillins" Has patient had a PCN reaction causing immediate rash, facial/tongue/throat swelling, SOB or lightheadedness with hypotension: Yes Has patient had a PCN reaction causing severe rash involving mucus membranes or skin necrosis: Yes Has patient had a PCN reaction that required hospitalization:  No Has patient had a PCN reaction occurring within the last 10 years: Yes If all of the above answers are "NO", then may proceed with Cephalosporin use.   Clindamycin/lincomycin Swelling   Other Hives, Other (See Comments)   Berry flavored food and drinks   Diphenhydramine Hcl Rash, Other (See Comments)   Doxycycline Rash   Egg-derived Products Rash        Medication List     STOP taking these medications    metoprolol tartrate 25 MG tablet Commonly known as: LOPRESSOR  nitroGLYCERIN 0.4 MG SL tablet Commonly known as: NITROSTAT   omeprazole 20 MG capsule Commonly known as: PRILOSEC       TAKE these medications    albuterol 108 (90 Base) MCG/ACT inhaler Commonly known as: VENTOLIN HFA Inhale 2 puffs into the lungs every 6 (six) hours as needed for wheezing or shortness of breath.   aspirin 81 MG chewable tablet Chew 1 tablet (81 mg total) by mouth daily.   atorvastatin 40 MG tablet Commonly known as: LIPITOR Take 1 tablet (40 mg total) by mouth daily.   carvedilol 3.125 MG tablet Commonly known as: COREG Take 1 tablet (3.125 mg total) by mouth 2 (two) times daily with a meal.   CENTRUM ADULTS PO Take 1 tablet by mouth at bedtime.   cetirizine 10 MG tablet Commonly known as: ZYRTEC Take 1 tablet (10 mg total) by mouth daily.   clopidogrel 75 MG tablet Commonly known as: PLAVIX Take 1 tablet (75 mg total) by mouth daily.   colchicine 0.6 MG tablet Take 1 tablet (0.6 mg total) by mouth daily.   EpiPen 2-Pak 0.3 mg/0.3 mL Soaj injection Generic drug: EPINEPHrine INJECT INTO THIGH MUSCLE THROUGH CLOTHES AS NEEDED SEVERE ALLERGIC REACTION   fluticasone 50 MCG/ACT nasal spray Commonly known as: FLONASE 1 spray by Each Nare route daily.   furosemide 40 MG tablet Commonly known as: LASIX Take 1 tablet (40 mg total) by mouth daily.   methimazole 5 MG tablet Commonly known as: TAPAZOLE Take by mouth.   montelukast 10 MG tablet Commonly known as:  SINGULAIR Take 1 tablet (10 mg total) by mouth daily.   nicotine 21 mg/24hr patch Commonly known as: NICODERM CQ - dosed in mg/24 hours Place 1 patch (21 mg total) onto the skin daily.   oxyCODONE 5 MG immediate release tablet Commonly known as: Oxy IR/ROXICODONE Take 1 tablet (5 mg total) by mouth every 6 (six) hours as needed for severe pain.   pantoprazole 40 MG tablet Commonly known as: PROTONIX Take 1 tablet (40 mg total) by mouth daily.   phentermine 37.5 MG tablet Commonly known as: ADIPEX-P Take 1 tablet (37.5 mg total) by mouth daily before breakfast.   potassium chloride SA 20 MEQ tablet Commonly known as: KLOR-CON M Take 1 tablet (20 mEq total) by mouth daily.        Follow-up Information     Alleen Borne, MD Follow up on 11/03/2022.   Specialty: Cardiothoracic Surgery Why: Follow up appointment is at 11:00AM Contact information: [address] [place] [address] [phone]          IMAGING Follow up on 11/03/2022.   Why: To get chest xray at 10:00AM Contact information: [address] [place] [address]        [place] [facility] Follow up on 09/13/2022.   Specialty: Cardiothoracic Surgery Why: Nurse visit for suture removal at 10:00AM Contact information: [address] [place], [address] [place] [address] [phone]        Creig Hines, NP Follow up on 09/23/2022.   Specialties: Cardiology, Radiology Why: Cardiology follow up is at 8:25AM Contact information: [address] [place] [address] [phone]         [employer] Follow up.   Why: Rolling walker arranged- to be delivered to room prior to discharge Contact information: [address] [place] [address] [phone]  Miki Kins, FNP Follow up on 09/14/2022.   Specialty: Family Medicine Why: Appt is at  11:15AM Contact information: [address] [place] [address] [phone]                 Signed:  Jenny Reichmann, PA-C  09/08/2022, 2:21 PM

## 2022-09-04 ENCOUNTER — Inpatient Hospital Stay (HOSPITAL_COMMUNITY): Payer: Medicaid Other

## 2022-09-04 LAB — CBC
HCT: 32.9 % — ABNORMAL LOW (ref 36.0–46.0)
Hemoglobin: 10.2 g/dL — ABNORMAL LOW (ref 12.0–15.0)
MCH: 29 pg (ref 26.0–34.0)
MCHC: 31 g/dL (ref 30.0–36.0)
MCV: 93.5 fL (ref 80.0–100.0)
Platelets: 115 10*3/uL — ABNORMAL LOW (ref 150–400)
RBC: 3.52 MIL/uL — ABNORMAL LOW (ref 3.87–5.11)
RDW: 13 % (ref 11.5–15.5)
WBC: 10.1 10*3/uL (ref 4.0–10.5)
nRBC: 0 % (ref 0.0–0.2)

## 2022-09-04 LAB — GLUCOSE, CAPILLARY
Glucose-Capillary: 93 mg/dL (ref 70–99)
Glucose-Capillary: 90 mg/dL (ref 70–99)
Glucose-Capillary: 92 mg/dL (ref 70–99)
Glucose-Capillary: 75 mg/dL (ref 70–99)
Glucose-Capillary: 89 mg/dL (ref 70–99)
Glucose-Capillary: 88 mg/dL (ref 70–99)

## 2022-09-04 LAB — BASIC METABOLIC PANEL
Anion gap: 9 (ref 5–15)
BUN: 7 mg/dL (ref 6–20)
CO2: 25 mmol/L (ref 22–32)
Calcium: 8.5 mg/dL — ABNORMAL LOW (ref 8.9–10.3)
Chloride: 99 mmol/L (ref 98–111)
Creatinine, Ser: 0.6 mg/dL (ref 0.44–1.00)
GFR, Estimated: >60 mL/min (ref >60)
Glucose, Bld: 98 mg/dL (ref 70–99)
Potassium: 4.5 mmol/L (ref 3.5–5.1)
Sodium: 133 mmol/L — ABNORMAL LOW (ref 135–145)

## 2022-09-04 LAB — CULTURE, BLOOD (ROUTINE X 2)

## 2022-09-04 MED ORDER — POTASSIUM CHLORIDE CRYS ER 20 MEQ PO TBCR
20.0000 meq | EXTENDED_RELEASE_TABLET | Freq: Every day | ORAL | Status: DC
  Filled 2022-09-04: qty 1
  Filled 2022-09-04: qty 2

## 2022-09-04 MED ORDER — COLCHICINE 0.6 MG PO TABS
0.6000 mg | ORAL_TABLET | Freq: Every day | ORAL | Status: DC
  Administered 2022-09-04 – 2022-09-07 (×4): 0.6 mg via ORAL
  Filled 2022-09-04 (×4): qty 1

## 2022-09-04 MED ORDER — POTASSIUM CHLORIDE 20 MEQ PO PACK
20.0000 meq | PACK | Freq: Every day | ORAL | Status: DC
  Administered 2022-09-04: 20 meq
  Filled 2022-09-04: qty 1

## 2022-09-04 MED ORDER — LORATADINE 10 MG PO TABS: 10.0000 mg | ORAL_TABLET | Freq: Every day | ORAL | Status: DC | PRN

## 2022-09-04 MED ORDER — CARVEDILOL 3.125 MG PO TABS
3.1250 mg | ORAL_TABLET | Freq: Two times a day (BID) | ORAL | Status: DC
  Administered 2022-09-04 – 2022-09-07 (×7): 3.125 mg via ORAL
  Filled 2022-09-04 (×7): qty 1

## 2022-09-04 MED ORDER — FUROSEMIDE 40 MG PO TABS
40.0000 mg | ORAL_TABLET | Freq: Every day | ORAL | Status: DC
  Administered 2022-09-04 – 2022-09-07 (×4): 40 mg via ORAL
  Filled 2022-09-04 (×4): qty 1

## 2022-09-04 MED ORDER — MONTELUKAST SODIUM 10 MG PO TABS
10.0000 mg | ORAL_TABLET | Freq: Every day | ORAL | Status: DC
  Administered 2022-09-04 – 2022-09-07 (×4): 10 mg via ORAL
  Filled 2022-09-04 (×4): qty 1

## 2022-09-04 NOTE — Progress Notes (Signed)
      [address]       [place] [address]             [phone]      POD # 2 CABG  Up in chair  BP 116/74   Pulse (!) 102   Temp 97.7 F (36.5 C) (Oral)   Resp 17   Ht 5\' 7"  (1.702 m)   Wt 109 kg   LMP 08/22/2017 Comment: says she just spots.  SpO2 96%   BMI 37.64 kg/m   Intake/Output Summary (Last 24 hours) at 09/04/2022 1745 Last data filed at 09/04/2022 0630 Gross per 24 hour  Intake --  Output 740 ml  Net -740 ml   CBG normal  Trinton Prewitt C. Dorris Fetch, MD [facility] [phone]

## 2022-09-04 NOTE — Progress Notes (Addendum)
2 Days Post-Op Procedure(s) (LRB): CORONARY ARTERY BYPASS GRAFTING (CABG) X 1 USING LEFT INTERNAL MAMMARY ARTERY (N/A) TRANSESOPHAGEAL ECHOCARDIOGRAM (N/A) Subjective: Multiple complaints, incisional pain, general malaise  Objective: Vital signs in last 24 hours: Temp:  [97.6 F (36.4 C)-98.4 F (36.9 C)] 97.6 F (36.4 C) (06/01 0735) Pulse Rate:  [104-127] 113 (06/01 0700) Cardiac Rhythm: Sinus tachycardia (06/01 0400) Resp:  [14-32] 24 (06/01 0700) BP: (85-110)/(57-82) 104/68 (06/01 0700) SpO2:  [68 %-100 %] 87 % (06/01 0700) Arterial Line BP: (100-118)/(53-66) 101/55 (05/31 1200) Weight:  [109 kg] 109 kg (06/01 0500)  Hemodynamic parameters for last 24 hours: PAP: (24-31)/(16-22) 24/17  Intake/Output from previous day: 05/31 0701 - 06/01 0700 In: 857.6 [P.O.:480; I.V.:227.6; IV Piggyback:150] Out: 1145 [Urine:765; Chest Tube:380] Intake/Output this shift: No intake/output data recorded.  General appearance: alert and mild distress Neurologic: intact Heart: tachy, regular, + rub Lungs: diminished breath sounds bibasilar  Lab Results: Recent Labs    09/03/22 1700 09/04/22 0534  WBC 11.5* 10.1  HGB 10.0* 10.2*  HCT 32.4* 32.9*  PLT 125* 115*   BMET:  Recent Labs    09/03/22 1700 09/04/22 0534  NA 136 133*  K 4.3 4.5  CL 99 99  CO2 25 25  GLUCOSE 95 98  BUN 8 7  CREATININE 0.64 0.60  CALCIUM 8.9 8.5*    PT/INR:  Recent Labs    09/02/22 1247  LABPROT 17.7*  INR 1.4*   ABG    Component Value Date/Time   PHART 7.364 09/02/2022 1701   HCO3 19.5 (L) 09/02/2022 1701   TCO2 21 (L) 09/02/2022 1701   ACIDBASEDEF 5.0 (H) 09/02/2022 1701   O2SAT 96 09/02/2022 1701   CBG (last 3)  Recent Labs    09/04/22 0105 09/04/22 0347 09/04/22 0736  GLUCAP 93 90 92    Assessment/Plan: S/P Procedure(s) (LRB): CORONARY ARTERY BYPASS GRAFTING (CABG) X 1 USING LEFT INTERNAL MAMMARY ARTERY (N/A) TRANSESOPHAGEAL ECHOCARDIOGRAM (N/A) POD # 2 CABG NEURO-  intact CV- sinus tachy in 110-120 range- will start low dose Coreg  ASA, statin  Loud rub- will start PO colchicine RESP- atelectasis at bases- continue IS, add flutter valve  Resume Singulair  Small air leak with cough- will place tube to water seal RENAL- creatinine and lytes OK  Doubt accuracy of admission weight but weight trending up over past few days  Start PO Lasix ENDO- CBG normal- dc SSI Gi- on carb modified diet SCD + enoxaparin, did ambulate a short distance this AM Pain control- Tylenol, narcotics and Toradol Thrombocytopenia- PLT down slightly monitor    LOS: 3 days    Loreli Slot 09/04/2022

## 2022-09-05 ENCOUNTER — Inpatient Hospital Stay (HOSPITAL_COMMUNITY): Payer: Medicaid Other

## 2022-09-05 LAB — BPAM RBC
Blood Product Expiration Date: 202406112359
Blood Product Expiration Date: 202406132359
Unit Type and Rh: 600
Unit Type and Rh: 600

## 2022-09-05 LAB — BASIC METABOLIC PANEL
Anion gap: 10 (ref 5–15)
BUN: 5 mg/dL — ABNORMAL LOW (ref 6–20)
CO2: 27 mmol/L (ref 22–32)
Calcium: 8.3 mg/dL — ABNORMAL LOW (ref 8.9–10.3)
Chloride: 100 mmol/L (ref 98–111)
Creatinine, Ser: 0.53 mg/dL (ref 0.44–1.00)
GFR, Estimated: >60 mL/min (ref >60)
Glucose, Bld: 81 mg/dL (ref 70–99)
Potassium: 3.9 mmol/L (ref 3.5–5.1)
Sodium: 137 mmol/L (ref 135–145)

## 2022-09-05 LAB — TYPE AND SCREEN
Antibody Screen: NEGATIVE
Unit division: 0

## 2022-09-05 LAB — CBC
HCT: 31 % — ABNORMAL LOW (ref 36.0–46.0)
Hemoglobin: 9.8 g/dL — ABNORMAL LOW (ref 12.0–15.0)
MCH: 29.3 pg (ref 26.0–34.0)
MCHC: 31.6 g/dL (ref 30.0–36.0)
MCV: 92.5 fL (ref 80.0–100.0)
Platelets: 131 10*3/uL — ABNORMAL LOW (ref 150–400)
RBC: 3.35 MIL/uL — ABNORMAL LOW (ref 3.87–5.11)
RDW: 12.7 % (ref 11.5–15.5)
WBC: 7.7 10*3/uL (ref 4.0–10.5)
nRBC: 0 % (ref 0.0–0.2)

## 2022-09-05 MED ORDER — POTASSIUM CHLORIDE 20 MEQ PO PACK
20.0000 meq | PACK | Freq: Two times a day (BID) | ORAL | Status: DC
  Administered 2022-09-05 (×2): 20 meq
  Filled 2022-09-05 (×2): qty 1

## 2022-09-05 NOTE — Progress Notes (Signed)
3 Days Post-Op Procedure(s) (LRB): CORONARY ARTERY BYPASS GRAFTING (CABG) X 1 USING LEFT INTERNAL MAMMARY ARTERY (N/A) TRANSESOPHAGEAL ECHOCARDIOGRAM (N/A) Subjective: C/o pain, refused to walk earlier  Objective: Vital signs in last 24 hours: Temp:  [97.7 F (36.5 C)-98.1 F (36.7 C)] 97.7 F (36.5 C) (06/01 1600) Pulse Rate:  [97-120] 104 (06/02 0700) Cardiac Rhythm: Sinus tachycardia;Normal sinus rhythm (06/02 0400) Resp:  [13-29] 15 (06/02 0700) BP: (95-116)/(58-89) 109/81 (06/02 0700) SpO2:  [83 %-100 %] 92 % (06/02 0700) Weight:  [107.4 kg] 107.4 kg (06/02 0500)  Hemodynamic parameters for last 24 hours:    Intake/Output from previous day: 06/01 0701 - 06/02 0700 In: 130 [P.O.:120; I.V.:10] Out: 1625 [Urine:1455; Chest Tube:170] Intake/Output this shift: No intake/output data recorded.  General appearance: alert and mild distress Neurologic: intact Heart: tachy, rub not as loud today Lungs: diminished breath sounds bibasilar Abdomen: normal findings: soft, non-tender  Lab Results: Recent Labs    09/04/22 0534 09/05/22 0505  WBC 10.1 7.7  HGB 10.2* 9.8*  HCT 32.9* 31.0*  PLT 115* 131*   BMET:  Recent Labs    09/04/22 0534 09/05/22 0505  NA 133* 137  K 4.5 3.9  CL 99 100  CO2 25 27  GLUCOSE 98 81  BUN 7 5*  CREATININE 0.60 0.53  CALCIUM 8.5* 8.3*    PT/INR:  Recent Labs    09/02/22 1247  LABPROT 17.7*  INR 1.4*   ABG    Component Value Date/Time   PHART 7.364 09/02/2022 1701   HCO3 19.5 (L) 09/02/2022 1701   TCO2 21 (L) 09/02/2022 1701   ACIDBASEDEF 5.0 (H) 09/02/2022 1701   O2SAT 96 09/02/2022 1701   CBG (last 3)  Recent Labs    09/04/22 1122 09/04/22 1600 09/04/22 1925  GLUCAP 75 89 88    Assessment/Plan: S/P Procedure(s) (LRB): CORONARY ARTERY BYPASS GRAFTING (CABG) X 1 USING LEFT INTERNAL MAMMARY ARTERY (N/A) TRANSESOPHAGEAL ECHOCARDIOGRAM (N/A) POD # 3 NEURO- intact CV- still in sinus tachy, rate better than  yesterday  On Coreg, increase dose when BP allows  ASA, statin RESP- no air leak- dc chest tube  Atelectasis a little better this AM - continue IS RENAL- creatinine normal, lytes OK  Continue PO Lasix, supplement K  Dc Foley ENDO- CBG normal prior to dc'ing [facility] GI- tolerating diet Pain control remains an issue Emphasized importance of ambulation SCD + enoxaparin for DVT prophylaxis   LOS: 4 days    Loreli Slot 09/05/2022

## 2022-09-05 NOTE — Progress Notes (Signed)
      [address]       [place] [address]             [phone]      POD # 3  No new issues BP 117/77 (BP Location: Left Arm)   Pulse (!) 106   Temp 98.4 F (36.9 C) (Oral)   Resp (!) 21   Ht 5\' 7"  (1.702 m)   Wt 107.4 kg   LMP 08/22/2017 Comment: says she just spots.  SpO2 99%   BMI 37.08 kg/m  Still on 7L HFNC  Intake/Output Summary (Last 24 hours) at 09/05/2022 1735 Last data filed at 09/05/2022 1700 Gross per 24 hour  Intake 370 ml  Output 2055 ml  Net -1685 ml   Wean O2 as tolerated  Viviann Spare C. Dorris Fetch, MD [facility] [phone]

## 2022-09-06 ENCOUNTER — Inpatient Hospital Stay (HOSPITAL_COMMUNITY): Payer: Medicaid Other

## 2022-09-06 ENCOUNTER — Other Ambulatory Visit (HOSPITAL_COMMUNITY): Payer: Self-pay

## 2022-09-06 LAB — BASIC METABOLIC PANEL WITH GFR
Anion gap: 11 (ref 5–15)
BUN: 5 mg/dL — ABNORMAL LOW (ref 6–20)
CO2: 26 mmol/L (ref 22–32)
Calcium: 8.1 mg/dL — ABNORMAL LOW (ref 8.9–10.3)
Chloride: 98 mmol/L (ref 98–111)
Creatinine, Ser: 0.49 mg/dL (ref 0.44–1.00)
GFR, Estimated: >60 mL/min
Glucose, Bld: 93 mg/dL (ref 70–99)
Potassium: 3.4 mmol/L — ABNORMAL LOW (ref 3.5–5.1)
Sodium: 135 mmol/L (ref 135–145)

## 2022-09-06 LAB — CBC
HCT: 32.8 % — ABNORMAL LOW (ref 36.0–46.0)
Hemoglobin: 10.6 g/dL — ABNORMAL LOW (ref 12.0–15.0)
MCH: 29.9 pg (ref 26.0–34.0)
MCHC: 32.3 g/dL (ref 30.0–36.0)
MCV: 92.7 fL (ref 80.0–100.0)
Platelets: 191 10*3/uL (ref 150–400)
RBC: 3.54 MIL/uL — ABNORMAL LOW (ref 3.87–5.11)
RDW: 12.8 % (ref 11.5–15.5)
WBC: 7.7 10*3/uL (ref 4.0–10.5)
nRBC: 0 % (ref 0.0–0.2)

## 2022-09-06 MED ORDER — POTASSIUM CHLORIDE CRYS ER 20 MEQ PO TBCR
40.0000 meq | EXTENDED_RELEASE_TABLET | Freq: Two times a day (BID) | ORAL | Status: AC
  Administered 2022-09-06 (×2): 40 meq via ORAL
  Filled 2022-09-06 (×2): qty 2

## 2022-09-06 MED ORDER — METOLAZONE 2.5 MG PO TABS
2.5000 mg | ORAL_TABLET | Freq: Once | ORAL | Status: AC
  Administered 2022-09-06: 2.5 mg via ORAL
  Filled 2022-09-06: qty 1

## 2022-09-06 NOTE — Progress Notes (Signed)
CARDIAC REHAB PHASE I   Pt feeling well this morning. Assisted pt back to bed from bathroom. Pt ambulating well with steady gait. Pt utilized proper sternal precautions with mobility. Pt would like rest before ambulating in hall. Reviewed sternal precautions, IS use and mobility importance. Pt has been weaned to 2L/[place], tolerating well. Will continue to follow.   1100-1140  Woodroe Chen, RN BSN 09/06/2022 11:46 AM

## 2022-09-06 NOTE — TOC Benefit Eligibility Note (Signed)
Patient Advocate Encounter  Insurance verification completed.    The patient is currently admitted and upon discharge could be taking Entresto 24-26 mg.  The current 30 day co-pay is $4.00.   The patient is currently admitted and upon discharge could be taking Farxiga 10 mg.  Requires Prior Authorization  The patient is currently admitted and upon discharge could be taking Jardiance 10 mg.  Requires Prior Authorization  The patient is insured through [place] Medicaid   This test claim was processed through [facility]- copay amounts may vary at other pharmacies due to pharmacy/plan contracts, or as the patient moves through the different stages of their insurance plan.  Emilianna Barlowe, CPHT Pharmacy Patient Advocate Specialist [facility] Patient Advocate Team Direct Number: [phone]  Fax: [phone]       

## 2022-09-06 NOTE — Progress Notes (Signed)
4 Days Post-Op Procedure(s) (LRB): CORONARY ARTERY BYPASS GRAFTING (CABG) X 1 USING LEFT INTERNAL MAMMARY ARTERY (N/A) TRANSESOPHAGEAL ECHOCARDIOGRAM (N/A) Subjective: Complains of pain in right thigh and tingling/numbness in skin of right thigh.   Objective: Vital signs in last 24 hours: Temp:  [97.8 F (36.6 C)-98.7 F (37.1 C)] 97.8 F (36.6 C) (06/03 0328) Pulse Rate:  [91-135] 135 (06/03 0600) Cardiac Rhythm: Sinus tachycardia (06/03 0600) Resp:  [13-26] 20 (06/03 0600) BP: (96-119)/(61-81) 119/69 (06/03 0600) SpO2:  [85 %-100 %] 95 % (06/03 0600) Weight:  [107.6 kg] 107.6 kg (06/03 0500)  Hemodynamic parameters for last 24 hours:    Intake/Output from previous day: 06/02 0701 - 06/03 0700 In: 1320 [P.O.:1320] Out: 1756 [Urine:1725; Stool:1; Chest Tube:30] Intake/Output this shift: No intake/output data recorded.  General appearance: alert and cooperative Neurologic: intact Heart: regular rate and rhythm, S1, S2 normal, no murmur Lungs: diminished breath sounds bibasilar Extremities: no edema Wound: incision healing well  Lab Results: Recent Labs    09/05/22 0505 09/06/22 0341  WBC 7.7 7.7  HGB 9.8* 10.6*  HCT 31.0* 32.8*  PLT 131* 191   BMET:  Recent Labs    09/05/22 0505 09/06/22 0341  NA 137 135  K 3.9 3.4*  CL 100 98  CO2 27 26  GLUCOSE 81 93  BUN 5* 5*  CREATININE 0.53 0.49  CALCIUM 8.3* 8.1*    PT/INR: No results for input(s): "LABPROT", "INR" in the last 72 hours. ABG    Component Value Date/Time   PHART 7.364 09/02/2022 1701   HCO3 19.5 (L) 09/02/2022 1701   TCO2 21 (L) 09/02/2022 1701   ACIDBASEDEF 5.0 (H) 09/02/2022 1701   O2SAT 96 09/02/2022 1701   CBG (last 3)  Recent Labs    09/04/22 1122 09/04/22 1600 09/04/22 1925  GLUCAP 75 89 88   CXR: bibasilar atelectasis  Assessment/Plan: S/P Procedure(s) (LRB): CORONARY ARTERY BYPASS GRAFTING (CABG) X 1 USING LEFT INTERNAL MAMMARY ARTERY (N/A) TRANSESOPHAGEAL ECHOCARDIOGRAM  (N/A)  POD 4  Hemodynamically stable in sinus rhythm. Continue low dose Coreg.  Diuresed some yesterday but weight stable. 7 lbs over preop. Continue diuresis and KCL. Will give a small dose of metolazone with lasix today.  Requiring 4-6 L HFNC. Continue IS, flutter valve and ambulation. Diuresis.  DC pacing wires and sleeve.  Transfer to 4E and continue mobilization.   LOS: 5 days    Alleen Borne 09/06/2022

## 2022-09-07 ENCOUNTER — Other Ambulatory Visit (HOSPITAL_COMMUNITY): Payer: Self-pay

## 2022-09-07 ENCOUNTER — Inpatient Hospital Stay (HOSPITAL_COMMUNITY): Payer: Medicaid Other

## 2022-09-07 LAB — CBC
HCT: 35.8 % — ABNORMAL LOW (ref 36.0–46.0)
Hemoglobin: 11.7 g/dL — ABNORMAL LOW (ref 12.0–15.0)
MCH: 29.3 pg (ref 26.0–34.0)
MCHC: 32.7 g/dL (ref 30.0–36.0)
MCV: 89.5 fL (ref 80.0–100.0)
Platelets: 216 10*3/uL (ref 150–400)
RBC: 4 MIL/uL (ref 3.87–5.11)
RDW: 12.8 % (ref 11.5–15.5)
WBC: 7.7 10*3/uL (ref 4.0–10.5)
nRBC: 0 % (ref 0.0–0.2)

## 2022-09-07 LAB — BASIC METABOLIC PANEL
Anion gap: 12 (ref 5–15)
BUN: 5 mg/dL — ABNORMAL LOW (ref 6–20)
CO2: 28 mmol/L (ref 22–32)
Calcium: 8.7 mg/dL — ABNORMAL LOW (ref 8.9–10.3)
Chloride: 96 mmol/L — ABNORMAL LOW (ref 98–111)
Creatinine, Ser: 0.64 mg/dL (ref 0.44–1.00)
GFR, Estimated: >60 mL/min (ref >60)
Glucose, Bld: 94 mg/dL (ref 70–99)
Potassium: 3.9 mmol/L (ref 3.5–5.1)
Sodium: 136 mmol/L (ref 135–145)

## 2022-09-07 MED ORDER — CLOPIDOGREL BISULFATE 75 MG PO TABS
75.0000 mg | ORAL_TABLET | Freq: Every day | ORAL | Status: DC
  Administered 2022-09-07: 75 mg via ORAL
  Filled 2022-09-07: qty 1

## 2022-09-07 MED ORDER — NICOTINE 21 MG/24HR TD PT24: 21.0000 mg | MEDICATED_PATCH | Freq: Every day | TRANSDERMAL | 0 refills | Status: DC

## 2022-09-07 MED ORDER — PANTOPRAZOLE SODIUM 40 MG PO TBEC: 40.0000 mg | DELAYED_RELEASE_TABLET | Freq: Every day | ORAL | Status: DC

## 2022-09-07 MED ORDER — SODIUM CHLORIDE 0.9% FLUSH: 3.0000 mL | INTRAVENOUS | Status: DC | PRN

## 2022-09-07 MED ORDER — SODIUM CHLORIDE 0.9 % IV SOLN: 250.0000 mL | INTRAVENOUS | Status: DC | PRN

## 2022-09-07 MED ORDER — CARVEDILOL 3.125 MG PO TABS
3.1250 mg | ORAL_TABLET | Freq: Two times a day (BID) | ORAL | 1 refills | Status: DC
  Filled 2022-09-07: qty 60, 30d supply, fill #0

## 2022-09-07 MED ORDER — POTASSIUM CHLORIDE CRYS ER 20 MEQ PO TBCR
20.0000 meq | EXTENDED_RELEASE_TABLET | Freq: Every day | ORAL | Status: DC
  Administered 2022-09-07: 20 meq via ORAL
  Filled 2022-09-07: qty 1

## 2022-09-07 MED ORDER — COLCHICINE 0.6 MG PO TABS
0.6000 mg | ORAL_TABLET | Freq: Every day | ORAL | 1 refills | Status: DC
  Filled 2022-09-07: qty 30, 30d supply, fill #0

## 2022-09-07 MED ORDER — ASPIRIN 325 MG PO TBEC: 325.0000 mg | DELAYED_RELEASE_TABLET | Freq: Every day | ORAL | Status: DC

## 2022-09-07 MED ORDER — CLOPIDOGREL BISULFATE 75 MG PO TABS
75.0000 mg | ORAL_TABLET | Freq: Every day | ORAL | 1 refills | Status: DC
  Filled 2022-09-07: qty 30, 30d supply, fill #0

## 2022-09-07 MED ORDER — FUROSEMIDE 40 MG PO TABS
40.0000 mg | ORAL_TABLET | Freq: Every day | ORAL | 0 refills | Status: DC
  Filled 2022-09-07: qty 5, 5d supply, fill #0

## 2022-09-07 MED ORDER — ~~LOC~~ CARDIAC SURGERY, PATIENT & FAMILY EDUCATION: Freq: Once | Status: DC

## 2022-09-07 MED ORDER — SODIUM CHLORIDE 0.9% FLUSH
3.0000 mL | Freq: Two times a day (BID) | INTRAVENOUS | Status: DC
  Administered 2022-09-07: 3 mL via INTRAVENOUS

## 2022-09-07 MED ORDER — OXYCODONE HCL 5 MG PO TABS
5.0000 mg | ORAL_TABLET | Freq: Four times a day (QID) | ORAL | 0 refills | Status: DC | PRN
  Filled 2022-09-07: qty 28, 7d supply, fill #0

## 2022-09-07 MED ORDER — ASPIRIN 81 MG PO TBEC
81.0000 mg | DELAYED_RELEASE_TABLET | Freq: Every day | ORAL | Status: DC
  Administered 2022-09-07: 81 mg via ORAL
  Filled 2022-09-07: qty 1

## 2022-09-07 MED ORDER — PANTOPRAZOLE SODIUM 40 MG PO TBEC
40.0000 mg | DELAYED_RELEASE_TABLET | Freq: Every day | ORAL | 0 refills | Status: DC
  Filled 2022-09-07: qty 30, 30d supply, fill #0

## 2022-09-07 MED ORDER — POTASSIUM CHLORIDE CRYS ER 20 MEQ PO TBCR
20.0000 meq | EXTENDED_RELEASE_TABLET | Freq: Every day | ORAL | 0 refills | Status: DC
  Filled 2022-09-07: qty 5, 5d supply, fill #0

## 2022-09-07 NOTE — Progress Notes (Signed)
Discharge instructions given. Patient verbalized understanding and all questions were answered.  ?

## 2022-09-07 NOTE — Progress Notes (Signed)
      [address]       [place] [address]             [phone]      5 Days Post-Op Procedure(s) (LRB): CORONARY ARTERY BYPASS GRAFTING (CABG) X 1 USING LEFT INTERNAL MAMMARY ARTERY (N/A) TRANSESOPHAGEAL ECHOCARDIOGRAM (N/A) Subjective: Pt states she wants to go home today.  Objective: Vital signs in last 24 hours: Temp:  [97.9 F (36.6 C)-98.6 F (37 C)] 98.3 F (36.8 C) (06/04 0412) Pulse Rate:  [97-111] 111 (06/04 0412) Cardiac Rhythm: Sinus tachycardia (06/04 0405) Resp:  [14-20] 20 (06/04 0600) BP: (93-118)/(50-75) 118/75 (06/04 0412) SpO2:  [91 %-97 %] 96 % (06/04 0412) Weight:  [101.6 kg] 101.6 kg (06/04 0500)  Hemodynamic parameters for last 24 hours:    Intake/Output from previous day: 06/03 0701 - 06/04 0700 In: 240 [P.O.:240] Out: -  Intake/Output this shift: No intake/output data recorded.  General appearance: alert, cooperative, and no distress Neurologic: intact Heart: regular rate and rhythm-sinus tachycardia, no murmur Lungs: Diminished bibasilar breath sounds Abdomen: soft, non-tender; bowel sounds normal; no masses,  no organomegaly Extremities: edema trace Wound: Clean and dry, no erythema or sign of infection  Lab Results: Recent Labs    09/06/22 0341 09/07/22 0237  WBC 7.7 7.7  HGB 10.6* 11.7*  HCT 32.8* 35.8*  PLT 191 216   BMET:  Recent Labs    09/06/22 0341 09/07/22 0237  NA 135 136  K 3.4* 3.9  CL 98 96*  CO2 26 28  GLUCOSE 93 94  BUN 5* 5*  CREATININE 0.49 0.64  CALCIUM 8.1* 8.7*    PT/INR: No results for input(s): "LABPROT", "INR" in the last 72 hours. ABG    Component Value Date/Time   PHART 7.364 09/02/2022 1701   HCO3 19.5 (L) 09/02/2022 1701   TCO2 21 (L) 09/02/2022 1701   ACIDBASEDEF 5.0 (H) 09/02/2022 1701   O2SAT 96 09/02/2022 1701   CBG (last 3)  Recent Labs    09/04/22 1122 09/04/22 1600 09/04/22 1925  GLUCAP 75 89 88    Assessment/Plan: S/P Procedure(s) (LRB): CORONARY  ARTERY BYPASS GRAFTING (CABG) X 1 USING LEFT INTERNAL MAMMARY ARTERY (N/A) TRANSESOPHAGEAL ECHOCARDIOGRAM (N/A)  CV: NSR-ST, SBP 118. HR 97-115. On Coreg 3.125. On Colchicine for suspected pericarditis. Start Plavix for hx of NSTEMI.  Pulm: Saturating well on 2L [place], on room air this AM. States she cannot walk around the unit without oxygen. Increase ambulation today on RA. CXR with small bilateral pleural effusions and atelectasis. Encouraged IS and ambulation. Educated on cessation of tobacco and illicit drug use.   GI: +BM 06/03, good appetite  Renal: Stable Cr 0.64. No UO recorded. Wt recorded down 14 lbs overnight, likely incorrect. Continue Lasix for pleural effusions.  Expected postop ABLA: Improving H/H 11.7/35.8  Dispo: Pt just weaned off oxygen has not ambulated on RA yet, increase ambulation on RA. Possibly home later today vs tomorrow AM.     LOS: 6 days    Jenny Reichmann, PA-C 09/07/2022

## 2022-09-07 NOTE — Progress Notes (Signed)
CARDIAC REHAB PHASE I   PRE:  Rate/Rhythm: 108 ST  BP:  Sitting: 119/65      SaO2: 96 RA  MODE:  Ambulation: 150 ft   POST:  Rate/Rhythm: 118 ST  BP:  Sitting: 121/76      SaO2: 94 RA  Pt resting in bed, feeling well today. Ambulated independently to bathroom and in hall using front wheel walker. Pt moving at quick steady pace, tolerating well with no pain, dizziness and mild SOB at end of walk. Returned to bed with call bell and bedside table in reach. Pt O2 saturations remained 90-94% during walk. Post OHS education including site care, restrictions, risk factors, OHS booklet, sternal precautions, IS use at home, home needs at discharge, smoking cessation, exercise guidelines, heart healthy diet and CRP2 reviewed. All questions and concerns addressed. Will refer to [facility] for CRP2. Will continue to follow.   [phone]  Woodroe Chen, RN BSN 09/07/2022 9:40 AM

## 2022-09-08 MED FILL — Electrolyte-R (PH 7.4) Solution: INTRAVENOUS | Qty: 4000 | Status: AC

## 2022-09-08 MED FILL — Sodium Bicarbonate IV Soln 8.4%: INTRAVENOUS | Qty: 50 | Status: AC

## 2022-09-08 MED FILL — Sodium Chloride IV Soln 0.9%: INTRAVENOUS | Qty: 3000 | Status: AC

## 2022-09-08 MED FILL — Heparin Sodium (Porcine) Inj 1000 Unit/ML: INTRAMUSCULAR | Qty: 30 | Status: AC

## 2022-09-08 MED FILL — Lidocaine HCl Local Soln Prefilled Syringe 100 MG/5ML (2%): INTRAMUSCULAR | Qty: 5 | Status: AC

## 2022-09-08 MED FILL — Heparin Sodium (Porcine) Inj 1000 Unit/ML: Qty: 1000 | Status: AC

## 2022-09-08 MED FILL — Mannitol IV Soln 20%: INTRAVENOUS | Qty: 500 | Status: AC

## 2022-09-08 MED FILL — Magnesium Sulfate Inj 50%: INTRAMUSCULAR | Qty: 10 | Status: AC

## 2022-09-08 MED FILL — Potassium Chloride Inj 2 mEq/ML: INTRAVENOUS | Qty: 40 | Status: AC

## 2022-09-08 NOTE — TOC Transition Note (Signed)
Transition of Care (TOC) - CM/SW Discharge Note Donn Pierini RN, BSN Transitions of Care Unit 4E- RN Case Manager See Treatment Team for direct phone #   Patient Details  Name: Elizabeth Spencer MRN: [id number] Date of Birth: [birth year]  Transition of Care [facility]) CM/SW Contact:  Darrold Span, RN Phone Number: 09/08/2022, 10:23 AM   Clinical Narrative:    Pt stable for transition home today, Per cardiac rehab pt requesting RW for home. Order placed and CM contacted [employer] for DME need- RW to be delivered to room prior to discharge.   No further TOC needs noted. Pt has transportation home   Final next level of care: Home/Self Care Barriers to Discharge: Barriers Resolved   Patient Goals and CMS Choice    Return home,   Discharge Placement                 Home         Discharge Plan and Services Additional resources added to the After Visit Summary for     Discharge Planning Services: CM Consult Post Acute Care Choice: Durable Medical Equipment          DME Arranged: Dan Humphreys rolling DME Agency: [facility] Date DME Agency Contacted: 09/07/22 Time DME Agency Contacted: 1330 Representative spoke with at DME Agency: [facility] HH Arranged: NA HH Agency: NA        Social Determinants of Health (SDOH) Interventions SDOH Screenings   Food Insecurity: No Food Insecurity (09/01/2022)  Housing: Patient Unable To Answer (09/01/2022)  Transportation Needs: No Transportation Needs (09/01/2022)  Utilities: Not At Risk (09/01/2022)  Tobacco Use: High Risk (09/03/2022)     Readmission Risk Interventions     No data to display

## 2022-09-13 ENCOUNTER — Ambulatory Visit: Payer: Self-pay

## 2022-09-13 ENCOUNTER — Other Ambulatory Visit: Payer: Self-pay | Admitting: Surgery

## 2022-09-13 DIAGNOSIS — Z951 Presence of aortocoronary bypass graft: Secondary | ICD-10-CM

## 2022-09-14 ENCOUNTER — Ambulatory Visit: Payer: Medicaid Other | Admitting: Family

## 2022-09-14 ENCOUNTER — Telehealth: Payer: Self-pay

## 2022-09-14 ENCOUNTER — Ambulatory Visit: Payer: Self-pay

## 2022-09-14 DIAGNOSIS — Z4802 Encounter for removal of sutures: Secondary | ICD-10-CM

## 2022-09-14 NOTE — Telephone Encounter (Signed)
Patient called, spoke with Dr. Laneta Simmers who states patient can take 5-10 mg of Oxycodone every 6 hours as needed for pain. States patient needs to call back for refill. Patient aware and also advised to contact her PCP for follow-up appointment regarding her gallbladder symptoms.

## 2022-09-14 NOTE — Telephone Encounter (Signed)
Spoke to pt earlier today regarding her being in a lot of pain with her gall bladder issue- said her surgeon sent in rx oxycodone 5mg  for her for the pain when she was in hospital but they told her to up dose to 10mg  every 6 hours as needed & they won't send refill for her though? So she asked if you could & she said she doesn't know what to eat due to bypass surgery & gall bladder issue. Mentioned that in the hospital her anxiety was really bad at night & they gave her rx xanax to help her sleep while she was there. She mentioned if you can send rx for anxiety & that will help her sleep? Said she doesn't necessarily want xanax again, but also mentioned she possibly might need a sleep study? - she did get her appt rescheduled but wanted me to run this by you as well please advise

## 2022-09-14 NOTE — Progress Notes (Signed)
Patient arrived for nurse visit to remove suture/staples post- procedure CABG with Dr. Laneta Simmers 5/30.  Three Sutures removed with no signs/ symptoms of infection noted.  Patient tolerated procedure well.  Incisions well approximated. Patient/ family instructed to keep the incision sites clean and dry.  Patient/ family acknowledged instructions given.    Patient states that her pain is not controlled on Oxycodone 5mg  q6hrs as prescribed with the Tylenol every 4 hours. She states that with her gallbladder acting up, it makes the pain worse. She has also contacted her PCP about this as well. Advised that a PA would be made aware to see if medication adjustments could be made. She acknowledged receipt. Advised that she could also take Benadryl or Tylenol Pm at night to help with sleep. She acknowledged receipt.

## 2022-09-16 ENCOUNTER — Other Ambulatory Visit: Payer: Self-pay | Admitting: Surgery

## 2022-09-16 ENCOUNTER — Telehealth: Payer: Self-pay | Admitting: Family

## 2022-09-16 MED ORDER — OXYCODONE HCL 5 MG PO TABS: 5.0000 mg | ORAL_TABLET | Freq: Four times a day (QID) | ORAL | 0 refills | Status: AC | PRN

## 2022-09-16 NOTE — Telephone Encounter (Signed)
Patient called and was wondering if we can send in her pain meds and something for anxiety. She was on Xanax when in the hospital. She is currently out of pain meds and needs them today. Please advise. Please let me know when the meds have been sent.  [facility]

## 2022-09-16 NOTE — Telephone Encounter (Signed)
Patient has called again now and I advised her that her surgeon needs to send in the pain meds because she states he increased her from 5 mg to 10 mg. Advised her I would talk to Marchelle Folks about sending her something in for anxiety. She keeps mentioning that in the hospital they had her on Xanax for sleep.

## 2022-09-16 NOTE — Telephone Encounter (Signed)
-----   Message from Alleen Borne, MD sent at 09/16/2022  4:41 PM EDT ----- Regarding: RE: pain med refill I renewed her oxy for 7 days, 28 pills. That will be it. She will need to take ibuprofen after that. ----- Message ----- From: Joycelyn Schmid, LPN Sent: 1/61/0960   3:49 PM EDT To: Alleen Borne, MD Subject: pain med refill                                Ms Mathia is calling for a refill on the Oxycodone RX. Please send to pharm noted in Epic chart if you agree/ please advise SW

## 2022-09-23 ENCOUNTER — Ambulatory Visit: Payer: Medicaid Other | Admitting: Nurse Practitioner

## 2022-09-27 ENCOUNTER — Ambulatory Visit: Payer: Medicaid Other | Admitting: Family

## 2022-09-30 ENCOUNTER — Telehealth: Payer: Self-pay

## 2022-09-30 ENCOUNTER — Encounter: Payer: Self-pay | Admitting: Emergency Medicine

## 2022-09-30 ENCOUNTER — Emergency Department: Payer: Medicaid Other

## 2022-09-30 ENCOUNTER — Other Ambulatory Visit: Payer: Self-pay

## 2022-09-30 DIAGNOSIS — F419 Anxiety disorder, unspecified: Secondary | ICD-10-CM | POA: Insufficient documentation

## 2022-09-30 DIAGNOSIS — R479 Unspecified speech disturbances: Secondary | ICD-10-CM | POA: Insufficient documentation

## 2022-09-30 DIAGNOSIS — R079 Chest pain, unspecified: Secondary | ICD-10-CM | POA: Diagnosis present

## 2022-09-30 DIAGNOSIS — R0789 Other chest pain: Secondary | ICD-10-CM | POA: Diagnosis not present

## 2022-09-30 DIAGNOSIS — R000 Tachycardia, unspecified: Secondary | ICD-10-CM | POA: Diagnosis not present

## 2022-09-30 DIAGNOSIS — Z955 Presence of coronary angioplasty implant and graft: Secondary | ICD-10-CM | POA: Insufficient documentation

## 2022-09-30 LAB — CBC
HCT: 43.9 % (ref 36.0–46.0)
Hemoglobin: 14 g/dL (ref 12.0–15.0)
MCH: 28.2 pg (ref 26.0–34.0)
MCHC: 31.9 g/dL (ref 30.0–36.0)
MCV: 88.3 fL (ref 80.0–100.0)
Platelets: 343 10*3/uL (ref 150–400)
RBC: 4.97 MIL/uL (ref 3.87–5.11)
RDW: 13.2 % (ref 11.5–15.5)
WBC: 10.4 10*3/uL (ref 4.0–10.5)
nRBC: 0 % (ref 0.0–0.2)

## 2022-09-30 LAB — BASIC METABOLIC PANEL
Anion gap: 13 (ref 5–15)
BUN: 7 mg/dL (ref 6–20)
CO2: 18 mmol/L — ABNORMAL LOW (ref 22–32)
Calcium: 9.4 mg/dL (ref 8.9–10.3)
Chloride: 103 mmol/L (ref 98–111)
Creatinine, Ser: 0.55 mg/dL (ref 0.44–1.00)
GFR, Estimated: >60 mL/min (ref >60)
Glucose, Bld: 97 mg/dL (ref 70–99)
Potassium: 3.8 mmol/L (ref 3.5–5.1)
Sodium: 134 mmol/L — ABNORMAL LOW (ref 135–145)

## 2022-09-30 LAB — TROPONIN I (HIGH SENSITIVITY): Troponin I (High Sensitivity): 11 ng/L (ref <18)

## 2022-09-30 NOTE — ED Triage Notes (Signed)
Pt c/o depression and chest pain. Pt c/o centralized chest pain worsening with exertion. Pain non-radiating, associated with SOB. Endorses smoking for past two weeks. Denies cocaine, THC, and ETOH. Recent Left Heart Cath 5/29. Also sts concern for increased depression with frequent crying spells following a breakup. Requesting anxiety medication in triage. No SI/HI

## 2022-09-30 NOTE — Telephone Encounter (Signed)
Patient contacted the office extremely upset, crying stating "I don't know what is wrong with me". She states that she is not sleeping, not eating, and she cries all the time. She states she is going through a break-up and it has made her symptoms worse. She states that she has called her PCP and has an appointment to be seen in the office a couple weeks from now, but she did not know the exact date. She tried to call and get the appointment with her PCP moved up, but she states they have not called her back and is unable to get an answer with anyone. She has asked her PCP for xanax as she was given it in the hospital. Advised we will not give medication for anxiety and she will not receive another refill from our office per Dr. Laneta Simmers. She states that she does not have thoughts of death or suicide. She has been getting up and showering, but she has not been able to do anything else. Advised patient due to the circumstances and symptoms, she should go to the nearest ER for evaluation and treatment. She acknowledged receipt.

## 2022-10-01 ENCOUNTER — Emergency Department
Admission: EM | Admit: 2022-10-01 | Discharge: 2022-10-01 | Disposition: A | Discharge status: Home / Self Care | Payer: Medicaid Other | Attending: Emergency Medicine | Admitting: Emergency Medicine

## 2022-10-01 DIAGNOSIS — F419 Anxiety disorder, unspecified: Secondary | ICD-10-CM

## 2022-10-01 DIAGNOSIS — R0789 Other chest pain: Secondary | ICD-10-CM

## 2022-10-01 LAB — TROPONIN I (HIGH SENSITIVITY): Troponin I (High Sensitivity): 10 ng/L (ref <18)

## 2022-10-01 MED ORDER — HYDROXYZINE HCL 25 MG PO TABS
25.0000 mg | ORAL_TABLET | Freq: Once | ORAL | Status: AC
  Administered 2022-10-01: 25 mg via ORAL
  Filled 2022-10-01: qty 1

## 2022-10-01 MED ORDER — HYDROXYZINE HCL 25 MG PO TABS: 25.0000 mg | ORAL_TABLET | Freq: Three times a day (TID) | ORAL | 1 refills | Status: DC | PRN

## 2022-10-01 NOTE — ED Provider Notes (Signed)
[facility] Provider Note    Event Date/Time   First MD Initiated Contact with Patient 10/01/22 [phone]     (approximate)   History   Chest Pain and Depression   HPI  Elizabeth Spencer is a 37 y.o. female who presents to the ED for evaluation of Chest Pain and Depression   I reviewed 5/31 CT surgery DC summary at [facility].  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  Started on colchicine for possible pericarditis.  Frequent sinus tachycardia.  DAPT.  EF 30%.  Patient presents to the ED for evaluation of frequent tearfulness and difficulty sleeping.  She reports that she has been going through a bad break-up with her boyfriend since she was discharged from the hospital and reports "cry myself to sleep every night."  Denies any suicidal thoughts or intent, homicidality or AVH.  She is upset that the hospital did not give her anything for her anxiety to go home with and when she tried to call the nursing line that told her to follow-up with her PCP.   She reluctantly admits to some mild centralized chest discomfort that is exertional, acknowledging it has improved since her recent surgery.  Reports her breathing feels fine.  Reports she has started smoking cigarettes again due to the stress of her break-up and tearfulness.  On my initial evaluation I discussed with her CTA chest to evaluate for PE considering her chest discomfort and tachycardia but she refuses.  She acknowledges that we may miss a diagnosis of a possible serious illness such as a PE.  She reports that she is "not getting that machine again."  And reports that she is really just here for her tearfulness and anxiety.  Physical Exam   Triage Vital Signs: ED Triage Vitals  Enc Vitals Group     BP 09/30/22 2106 (!) 127/94     Pulse Rate 09/30/22 2102 (!) 122     Resp 09/30/22 2102 (!) 22     Temp  09/30/22 2102 98.5 F (36.9 C)     Temp Source 09/30/22 2102 Oral     SpO2 09/30/22 2102 94 %     Weight 09/30/22 2106 223 lb 15.8 oz (101.6 kg)     Height 09/30/22 2106 5\' 7"  (1.702 m)     Head Circumference --      Peak Flow --      Pain Score 09/30/22 2106 6     Pain Loc --      Pain Edu? --      Excl. in GC? --     Most recent vital signs: Vitals:   10/01/22 0107 10/01/22 0107  BP:  137/89  Pulse:  (!) 114  Resp:  18  Temp: 98.3 F (36.8 C)   SpO2:  98%    General: Awake, no distress.  Linear thoughts CV:  Good peripheral perfusion.  Resp:  Normal effort.  Abd:  No distention.  MSK:  No deformity noted.  Neuro:  No focal deficits appreciated. Other:     ED Results / Procedures / Treatments   Labs (all labs ordered are listed, but only abnormal results are displayed) Labs Reviewed  BASIC METABOLIC PANEL - Abnormal; Notable for the following components:      Result Value   Sodium 134 (*)    CO2 18 (*)    All  other components within normal limits  CBC  TROPONIN I (HIGH SENSITIVITY)  TROPONIN I (HIGH SENSITIVITY)    EKG Sinus tach with a rate of 116 bpm.  Rightward axis and normal intervals.  No clear signs of acute ischemia.  RADIOLOGY CXR interpreted by me without evidence of acute cardiopulmonary pathology.  Official radiology report(s): DG Chest 2 View  Result Date: 09/30/2022 CLINICAL DATA:  Chest pain. EXAM: CHEST - 2 VIEW COMPARISON:  None Available. FINDINGS: Multiple sternal wires and vascular clips are seen. The heart size and mediastinal contours are within normal limits. Low lung volumes are noted. Mild linear atelectasis is seen within the left lung base. There is no evidence of a pleural effusion or pneumothorax. The visualized skeletal structures are unremarkable. IMPRESSION: 1. Evidence of prior median sternotomy/CABG. 2. Low lung volumes with mild left basilar linear atelectasis. Electronically Signed   By: Aram Candela M.D.   On:  09/30/2022 21:41    PROCEDURES and INTERVENTIONS:  .1-3 Lead EKG Interpretation  Performed by: Delton Prairie, MD Authorized by: Delton Prairie, MD     Interpretation: abnormal     ECG rate:  112   ECG rate assessment: tachycardic     Rhythm: sinus tachycardia     Ectopy: none     Conduction: normal     Medications  hydrOXYzine (ATARAX) tablet 25 mg (25 mg Oral Given 10/01/22 0049)     IMPRESSION / MDM / ASSESSMENT AND PLAN / ED COURSE  I reviewed the triage vital signs and the nursing notes.  Differential diagnosis includes, but is not limited to, ACS, PTX, PNA, muscle strain/spasm, PE, dissection, anxiety, pleural effusion  {Patient presents with symptoms of an acute illness or injury that is potentially life-threatening.  Young woman who recently had a CABG x 1 presents with tearfulness, acute depression and chest discomfort.  No signs of psychiatric emergency to necessitate IVC or emergent psychiatric evaluation.  I discussed starting as needed hydroxyzine and she is agreeable.  I recommended CTA chest but patient refuses, as documented separately, knowledge and risks.  Her cardiac workup is generally reassuring with a nonischemic EKG with sinus tachycardia.  Normal CBC and electrolytes.  First troponin is normal and we are awaiting a repeat.  CXR without infiltrate or pulmonary edema.  Second Trope also normal.  As indicated below, multiple conversations regarding CTA chest to evaluate for PE.  She refuses.  She is suitable for outpatient management.  Provided prescription for hydroxyzine and discussed the importance of cardiac follow-up as well as appropriate return precautions for the ED.  Clinical Course as of 10/01/22 0213  Fri Oct 01, 2022  0149 Reassessed.  Reports feeling well and requesting to go home.  Again refuses CT imaging [DS]    Clinical Course User Index [DS] Delton Prairie, MD     FINAL CLINICAL IMPRESSION(S) / ED DIAGNOSES   Final diagnoses:  Anxiety   Other chest pain     Rx / DC Orders   ED Discharge Orders          Ordered    hydrOXYzine (ATARAX) 25 MG tablet  3 times daily PRN        10/01/22 0150             Note:  This document was prepared using Dragon voice recognition software and may include unintentional dictation errors.   Delton Prairie, MD 10/01/22 [phone]

## 2022-10-06 ENCOUNTER — Ambulatory Visit: Payer: Medicaid Other | Admitting: Surgery

## 2022-10-12 ENCOUNTER — Encounter: Payer: Self-pay | Admitting: Medical

## 2022-10-12 ENCOUNTER — Ambulatory Visit: Payer: MEDICAID | Attending: Nurse Practitioner | Admitting: Medical

## 2022-10-12 VITALS — BP 108/68 | HR 114 | Ht 67.0 in | Wt 221.8 lb

## 2022-10-12 DIAGNOSIS — Z79899 Other long term (current) drug therapy: Secondary | ICD-10-CM

## 2022-10-12 DIAGNOSIS — I214 Non-ST elevation (NSTEMI) myocardial infarction: Secondary | ICD-10-CM | POA: Diagnosis not present

## 2022-10-12 DIAGNOSIS — Z951 Presence of aortocoronary bypass graft: Secondary | ICD-10-CM | POA: Diagnosis not present

## 2022-10-12 DIAGNOSIS — Z720 Tobacco use: Secondary | ICD-10-CM

## 2022-10-12 DIAGNOSIS — I255 Ischemic cardiomyopathy: Secondary | ICD-10-CM

## 2022-10-12 DIAGNOSIS — I2510 Atherosclerotic heart disease of native coronary artery without angina pectoris: Secondary | ICD-10-CM | POA: Diagnosis not present

## 2022-10-12 DIAGNOSIS — F1490 Cocaine use, unspecified, uncomplicated: Secondary | ICD-10-CM

## 2022-10-12 DIAGNOSIS — R21 Rash and other nonspecific skin eruption: Secondary | ICD-10-CM

## 2022-10-12 DIAGNOSIS — D6851 Activated protein C resistance: Secondary | ICD-10-CM

## 2022-10-12 MED ORDER — METOPROLOL SUCCINATE ER 25 MG PO TB24: 12.5000 mg | ORAL_TABLET | Freq: Every day | ORAL | 3 refills | Status: DC

## 2022-10-12 MED ORDER — DAPAGLIFLOZIN PROPANEDIOL 10 MG PO TABS: 10.0000 mg | ORAL_TABLET | Freq: Every day | ORAL | 3 refills | Status: DC

## 2022-10-12 MED ORDER — CLOPIDOGREL BISULFATE 75 MG PO TABS: 75.0000 mg | ORAL_TABLET | Freq: Every day | ORAL | 3 refills | Status: DC

## 2022-10-12 MED ORDER — ATORVASTATIN CALCIUM 40 MG PO TABS: 40.0000 mg | ORAL_TABLET | Freq: Every day | ORAL | 3 refills | Status: DC

## 2022-10-12 MED ORDER — NICOTINE 21 MG/24HR TD PT24: 21.0000 mg | MEDICATED_PATCH | Freq: Every day | TRANSDERMAL | 0 refills | Status: DC

## 2022-10-12 MED ORDER — ASPIRIN 81 MG PO CHEW: 81.0000 mg | CHEWABLE_TABLET | Freq: Every day | ORAL | 3 refills | Status: DC

## 2022-10-12 NOTE — Progress Notes (Signed)
Cardiology Office Note:    Date:  10/12/2022   ID:  Elizabeth Spencer, DOB 1985-10-06, MRN [id number]  PCP:  Miki Kins, FNP  [facility] Cardiologist:  None  [facility] Electrophysiologist:  None   Referring MD: Miki Kins, FNP   Chief Complaint: Hospital follow-up  History of Present Illness:    Elizabeth Spencer is a 37 y.o. 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  Patient was seen in May 2024 for chest pain during an admission.  She had recently been diagnosed with factor V Leiden and was going to be started on a blood thinner.  High-sensitivity troponin went up to 1163.  UDS + cocaine.  She was started on IV heparin and admitted.  Patient underwent heart cath that showed severe single-vessel CAD with hazy eccentric 90% ostial LAD stenosis concerning for acute plaque rupture, tubular 40% proximal RCA stenosis, large circumflex without significant disease.  EF noted to be 30 to 35%.  Follow-up echo confirmed LVEF of 30 to 35%, mild LVH, grade 1 diastolic dysfunction, mild MR.  Patient was transferred to [facility] for CABG consideration.  She underwent CABG x 1 utilizing LIMA to LAD.  Post-op period was uncomplicated. Patient was discharged on 09/03/2022.  Today, the patient reports she is not feeling well. She ran out of medications 2-3 days ago. She has persistent shortness of breath. She has rash around the incision line and it is itchy. She reports she needs an anxiety medication, I recommended she see PCP for this. She feels some chest irritation, no chest pain. EKG shows sinus tachycardia with a heart rate of 116bpm, which appears chronic.   Past Medical History:  Diagnosis Date   Aneurysm (HCC) 01/04/2014   Arthritis    Asthma    WELL  CONTROLLED   Brain aneurysm 2007   NEUROLOGY NOTE DOES NOT MENTION ANEURYSM BUT PT STATES SHE DID NOT HAVE TO HAVE SURGERY   Complication of anesthesia    FOR 1 C-SECTION PT WAS ITCHING AND VERY RED ON HER FACE   Cough, persistent 01/27/2016   Dermatitis due to sunburn 11/08/2013   Family history of adverse reaction to anesthesia    brother, neice and nephew got red in face with hives   Gallstones    GERD (gastroesophageal reflux disease)    Gonorrhea 06/20/2020   Headache    CHRONIC HEADACHES   History of chronic cough    DRY   Loss of memory 05/30/2017   Perforation of left tympanic membrane 05/17/2013   Pneumonia    PTSD (post-traumatic stress disorder)    PTSD (post-traumatic stress disorder)    Thyroid condition    PT WAS JUST TOLD ON 08-25-17 THAT SHE HAS A THYROID PROBLEM AND IS GOING TO F/U WITH ENDOCRINOLOGIST IN 2 WEEKS   Trichomonas vaginalis (TV) infection 06/20/2020    Past Surgical History:  Procedure Laterality Date   ABDOMINAL HYSTERECTOMY     CESAREAN SECTION     x3   CORONARY ARTERY BYPASS GRAFT N/A 09/02/2022   Procedure: CORONARY ARTERY BYPASS GRAFTING (CABG) X 1 USING LEFT INTERNAL MAMMARY ARTERY;  Surgeon: Alleen Borne, MD;  Location: [facility];  Service: Open Heart Surgery;  Laterality: N/A;   CYSTOSCOPY N/A 09/29/2017   Procedure: CYSTOSCOPY;  Surgeon: Vena Austria, MD;  Location: [facility] ORS;  Service: Gynecology;  Laterality: N/A;   CYSTOSCOPY N/A 11/05/2017   Procedure: CYSTOSCOPY;  Surgeon: Vena Austria, MD;  Location: [facility] ORS;  Service: Gynecology;  Laterality: N/A;   HYSTEROSCOPY WITH D & C N/A 09/01/2017   Procedure: DILATATION AND CURETTAGE /HYSTEROSCOPY;  Surgeon: Vena Austria, MD;  Location: [facility] ORS;  Service: Gynecology;  Laterality: N/A;   LAPAROSCOPY N/A 09/01/2017   Procedure: LAPAROSCOPY OPERATIVE with biopsy;  Surgeon: Vena Austria, MD;  Location: [facility] ORS;  Service: Gynecology;  Laterality: N/A;   LAPAROSCOPY N/A 11/05/2017    Procedure: LAPAROSCOPY DIAGNOSTIC;  Surgeon: Vena Austria, MD;  Location: [facility] ORS;  Service: Gynecology;  Laterality: N/A;   LEFT HEART CATH AND CORONARY ANGIOGRAPHY N/A 09/01/2022   Procedure: LEFT HEART CATH AND CORONARY ANGIOGRAPHY;  Surgeon: Yvonne Kendall, MD;  Location: [facility] INVASIVE CV LAB;  Service: Cardiovascular;  Laterality: N/A;   REPAIR VAGINAL CUFF N/A 11/05/2017   Procedure: REPAIR VAGINAL CUFF;  Surgeon: Vena Austria, MD;  Location: [facility] ORS;  Service: Gynecology;  Laterality: N/A;   TEE WITHOUT CARDIOVERSION N/A 09/02/2022   Procedure: TRANSESOPHAGEAL ECHOCARDIOGRAM;  Surgeon: Alleen Borne, MD;  Location: [facility] OR;  Service: Open Heart Surgery;  Laterality: N/A;   TOTAL LAPAROSCOPIC HYSTERECTOMY WITH SALPINGECTOMY Bilateral 09/29/2017   Procedure: TOTAL LAPAROSCOPIC HYSTERECTOMY WITH SALPINGECTOMY;  Surgeon: Vena Austria, MD;  Location: [facility] ORS;  Service: Gynecology;  Laterality: Bilateral;    Current Medications: Current Meds  Medication Sig   albuterol (VENTOLIN HFA) 108 (90 Base) MCG/ACT inhaler Inhale 2 puffs into the lungs every 6 (six) hours as needed for wheezing or shortness of breath.   colchicine 0.6 MG tablet Take 1 tablet (0.6 mg total) by mouth daily.   dapagliflozin propanediol (FARXIGA) 10 MG TABS tablet Take 1 tablet (10 mg total) by mouth daily before breakfast.   EPIPEN 2-PAK 0.3 MG/0.3ML SOAJ injection INJECT INTO THIGH MUSCLE THROUGH CLOTHES AS NEEDED SEVERE ALLERGIC REACTION   hydrOXYzine (ATARAX) 25 MG tablet Take 1 tablet (25 mg total) by mouth 3 (three) times daily as needed for anxiety.   methimazole (TAPAZOLE) 5 MG tablet Take by mouth.   metoprolol succinate (TOPROL XL) 25 MG 24 hr tablet Take 0.5 tablets (12.5 mg total) by mouth daily.   montelukast (SINGULAIR) 10 MG tablet Take 1 tablet (10 mg total) by mouth daily.   Multiple Vitamins-Minerals (CENTRUM ADULTS PO) Take 1 tablet by mouth at bedtime.    pantoprazole (PROTONIX) 40 MG  tablet Take 1 tablet (40 mg total) by mouth daily.   [DISCONTINUED] aspirin 81 MG chewable tablet Chew 1 tablet (81 mg total) by mouth daily.   [DISCONTINUED] atorvastatin (LIPITOR) 40 MG tablet Take 1 tablet (40 mg total) by mouth daily.   [DISCONTINUED] carvedilol (COREG) 3.125 MG tablet Take 1 tablet (3.125 mg total) by mouth 2 (two) times daily with a meal.   [DISCONTINUED] clopidogrel (PLAVIX) 75 MG tablet Take 1 tablet (75 mg total) by mouth daily.   [DISCONTINUED] furosemide (LASIX) 40 MG tablet Take 1 tablet (40 mg total) by mouth daily.   [DISCONTINUED] potassium chloride SA (KLOR-CON M) 20 MEQ tablet Take 1 tablet (20 mEq total) by mouth daily.     Allergies:   Azithromycin, Clindamycin, Codeine, Meloxicam, Neurontin [gabapentin], Penicillins, Clindamycin/lincomycin, Other, Diphenhydramine hcl, Doxycycline, and Egg-derived products   Social History   Socioeconomic History   Marital status: Single    Spouse name: Not on file   Number of children:  Not on file   Years of education: Not on file   Highest education level: Not on file  Occupational History   Not on file  Tobacco Use   Smoking status: Every Day    Packs/day: 0.50    Years: 18.00    Additional pack years: 0.00    Total pack years: 9.00    Types: Cigarettes   Smokeless tobacco: Never  Vaping Use   Vaping Use: Never used  Substance and Sexual Activity   Alcohol use: Yes    Alcohol/week: 0.0 standard drinks of alcohol    Comment: occasionally   Drug use: Not Currently    Types: Marijuana, Cocaine   Sexual activity: Yes    Partners: Male    Birth control/protection: Surgical    Comment: Hysterectomy  Other Topics Concern   Not on file  Social History Narrative   Not on file   Social Determinants of Health   Financial Resource Strain: Not on file  Food Insecurity: No Food Insecurity (09/01/2022)   Hunger Vital Sign    Worried About Running Out of Food in the Last Year: Never true    Ran Out of Food in  the Last Year: Never true  Transportation Needs: No Transportation Needs (09/01/2022)   PRAPARE - Administrator, Civil Service (Medical): No    Lack of Transportation (Non-Medical): No  Physical Activity: Not on file  Stress: Not on file  Social Connections: Not on file     Family History: The patient's family history includes Alcohol abuse in her father and mother; COPD in her father; Deep vein thrombosis in her mother; Emphysema in her father; Heart disease in her father; Hypertension in her mother.  ROS:   Please see the history of present illness.     All other systems reviewed and are negative.  EKGs/Labs/Other Studies Reviewed:    The following studies were reviewed today:  Intraoperative TEE 08/2022 Complications: No known complications during this procedure.  POST-OP IMPRESSIONS  _ Left Ventricle: The left ventricle is unchanged from pre-bypass. EF  25-30%  _ Right Ventricle: The right ventricle appears unchanged from pre-bypass.  _ Aorta: The aorta appears unchanged from pre-bypass.  _ Left Atrium: The left atrium appears unchanged from pre-bypass.  _ Left Atrial Appendage: The left atrial appendage appears unchanged from  pre-bypass.  _ Aortic Valve: The aortic valve appears unchanged from pre-bypass.  _ Mitral Valve: The mitral valve appears unchanged from pre-bypass.  _ Tricuspid Valve: The tricuspid valve appears unchanged from pre-bypass.  _ Pulmonic Valve: The pulmonic valve appears unchanged from pre-bypass.  _ Interatrial Septum: The interatrial septum appears unchanged from  pre-bypass.  _ Interventricular Septum: The interventricular septum appears unchanged  from  pre-bypass.  _ Pericardium: The pericardium appears unchanged from pre-bypass.   Echo 08/2022  1. Left ventricular ejection fraction, by estimation, is 30 to 35%. The  left ventricle has moderate to severely decreased function. The left  ventricle demonstrates global hypokinesis.  There is mild left ventricular  hypertrophy. Left ventricular  diastolic parameters are consistent with Grade I diastolic dysfunction  (impaired relaxation).   2. Right ventricular systolic function is normal. The right ventricular  size is not well visualized.   3. The mitral valve is normal in structure. Mild mitral valve  regurgitation.   4. The aortic valve was not well visualized. Aortic valve regurgitation  is not visualized.   LHC 08/2022 Conclusions: Severe single-vessel coronary artery disease, with hazy and  eccentric 90% ostial LAD stenosis concerning for acute plaque rupture.  Tubular 40% proximal RCA stenosis is also present.  Large LCx without significant disease. Moderately to severely reduced left ventricular systolic function (LVEF 30-35%) with anterior wall hypokinesis/akinesis.  LVEF 30-35%. Moderately elevated left ventricular filling pressure (LVEDP 25 mmHg).   Recommendations: Critical ostial LAD disease not well-suited for PCI given risk for compromise of LCx and D1.  Images reviewed with interventional cardiology and cardiac surgery teams at [facility]; we will arrange for transfer to [facility] with plans for CABG tomorrow. Restart heparin infusion 2 hours after TR band removal. Titrate NTG infusion for relief of chest pain.  IABP deferred due to patient's inability to lie flat and remain still. Aggressive secondary prevention of coronary artery disease. Obtain echocardiogram.   Yvonne Kendall, MD [facility]     Recommendations  Antiplatelet/Anticoag Continue aspirin 81 mg daily.  Restart heparin infusion 2 hours after TR band removal.  Defer addition of P2Y12 pending CABG.  Discharge Date Anticipated discharge date to be determined.    EKG:  EKG is ordered today.  The ekg ordered today demonstrates ST 114bpm, TWI aVL  Recent Labs: 09/01/2022: ALT 34; TSH 0.018 09/03/2022: Magnesium 1.9 09/30/2022: BUN 7; Creatinine, Ser 0.55; Hemoglobin 14.0; Platelets  343; Potassium 3.8; Sodium 134  Recent Lipid Panel    Component Value Date/Time   CHOL 135 08/31/2022 2200   CHOL 191 06/17/2022 1533   TRIG 110 08/31/2022 2200   HDL 46 08/31/2022 2200   HDL 45 06/17/2022 1533   CHOLHDL 2.9 08/31/2022 2200   VLDL 22 08/31/2022 2200   LDLCALC 67 08/31/2022 2200   LDLCALC 108 (H) 06/17/2022 1533     Physical Exam:    VS:  BP 108/68 (BP Location: Left Arm, Patient Position: Sitting, Cuff Size: Large)   Pulse (!) 114   Ht 5\' 7"  (1.702 m)   Wt 221 lb 12.8 oz (100.6 kg)   LMP 08/22/2017 Comment: says she just spots.  SpO2 98%   BMI 34.74 kg/m     Wt Readings from Last 3 Encounters:  10/12/22 221 lb 12.8 oz (100.6 kg)  09/30/22 223 lb 15.8 oz (101.6 kg)  09/07/22 223 lb 15.8 oz (101.6 kg)     GEN:  Well nourished, well developed in no acute distress HEENT: Normal NECK: No JVD; No carotid bruits LYMPHATICS: No lymphadenopathy CARDIAC: RRR, no murmurs, rubs, gallops RESPIRATORY:  Clear to auscultation without rales, wheezing or rhonchi  ABDOMEN: Soft, non-tender, non-distended MUSCULOSKELETAL:  No edema; No deformity  SKIN: Warm and dry NEUROLOGIC:  Alert and oriented x 3 PSYCHIATRIC:  Normal affect   ASSESSMENT:    1. NSTEMI (non-ST elevated myocardial infarction) (HCC)   2. Medication management   3. Coronary artery disease involving native coronary artery of native heart without angina pectoris   4. S/P CABG x 1   5. Ischemic cardiomyopathy   6. Factor 5 Leiden mutation, heterozygous (HCC)   7. Rash   8. Cocaine use   9. Tobacco use    PLAN:    In order of problems listed above:  NSTEMI CAD s/p CABG x1 Patient has been off all her medications for 2-3 days. She has a rash around the incision site. She has no over chest pain, but feels discomfort from the rash. She reports persistent DOE. She has follow-up with CT surgery tomorrow. I will refill aspirin, plavix and lipitor. Heart rate is high (sinus tachycardia), which is not  new.  I will start Toprol 12.5mg  daily. CBC and BMET today. I will refer to cardiac rehab. We will reevaluate symptoms at follow-up.  HFrEF ICM Echo showed LVEF 30-35%. She recently ran out of lasix and potassium. She does not appear significantly volume up on exam. I will restart Toprol as above. I will start Farxiga 10mg  daily. BP may limit GDMT. Plan to repeat echo in 1-2 months.   Tobacco use  Refill nicotine patch today. She is smoking again.  Cocaine use She denies recent drug use.   Rash H/o eczema. Localized rash around surgical incisions. May need ?steroid cream. Recommended f/u with PCP.  Factor 5 Leiden Continue Aspirin and Plavix.  HLD LDL 67, continue Lipitor 40mg  daily.   Disposition: Follow up in 1 month(s) with MD/APP    Signed, Cashlynn Yearwood David Stall, PA-C  10/12/2022 4:31 PM    [facility]

## 2022-10-12 NOTE — Patient Instructions (Signed)
Medication Instructions:  Your physician recommends the following medication changes.  STOP TAKING: Carvedilol Lasix Potassium  START TAKING: Metoprolol Succinate 12.5 mg by mouth daily Farxiga 10 mg by mouth daily  *If you need a refill on your cardiac medications before your next appointment, please call your pharmacy*   Lab Work: Your provider would like for you to have following labs drawn today (BMP, CBC,). Then return in 2 weeks (BMP)  Please go to the [facility] entrance and check in at the front desk.  You do not need an appointment.  They are open from 7am-6 pm.  .   Testing/Procedures: None ordered today   Follow-Up: At [facility], you and your health needs are our priority.  As part of our continuing mission to provide you with exceptional heart care, we have created designated Provider Care Teams.  These Care Teams include your primary Cardiologist (physician) and Advanced Practice Providers (APPs -  Physician Assistants and Nurse Practitioners) who all work together to provide you with the care you need, when you need it.  We recommend signing up for the patient portal called "MyChart".  Sign up information is provided on this After Visit Summary.  MyChart is used to connect with patients for Virtual Visits (Telemedicine).  Patients are able to view lab/test results, encounter notes, upcoming appointments, etc.  Non-urgent messages can be sent to your provider as well.   To learn more about what you can do with MyChart, go to [email or web address].    Your next appointment:   1 month(s)  Provider:   Terrilee Croak, PA-C

## 2022-10-13 ENCOUNTER — Encounter: Payer: Self-pay | Admitting: Surgery

## 2022-10-13 ENCOUNTER — Telehealth: Payer: Self-pay | Admitting: Nurse Practitioner

## 2022-10-13 ENCOUNTER — Ambulatory Visit
Admission: RE | Admit: 2022-10-13 | Discharge: 2022-10-13 | Disposition: A | Discharge status: Home / Self Care | Payer: MEDICAID | Source: Ambulatory Visit | Attending: Surgery | Admitting: Surgery

## 2022-10-13 ENCOUNTER — Ambulatory Visit (INDEPENDENT_AMBULATORY_CARE_PROVIDER_SITE_OTHER): Payer: Self-pay | Admitting: Surgery

## 2022-10-13 VITALS — BP 119/75 | HR 110 | Resp 20 | Ht 67.0 in | Wt 221.0 lb

## 2022-10-13 DIAGNOSIS — Z951 Presence of aortocoronary bypass graft: Secondary | ICD-10-CM

## 2022-10-13 MED ORDER — ATORVASTATIN CALCIUM 40 MG PO TABS: 40.0000 mg | ORAL_TABLET | Freq: Every day | ORAL | 3 refills | Status: DC

## 2022-10-13 MED ORDER — NICOTINE 21 MG/24HR TD PT24: 21.0000 mg | MEDICATED_PATCH | Freq: Every day | TRANSDERMAL | 0 refills | Status: DC

## 2022-10-13 NOTE — Progress Notes (Signed)
HPI: Patient returns for routine postoperative follow-up having undergone coronary bypass graft surgery x 1 using a LIMA to the LAD on 09/02/2022. The patient's early postoperative recovery while in the hospital was notable for an uncomplicated postoperative course. Since hospital discharge the patient reports that she has been feeling well.  She is walking daily without chest pain or shortness of breath.  Her stamina seems to be improving.  She has returned to smoking cigarettes.   Current Outpatient Medications  Medication Sig Dispense Refill   albuterol (VENTOLIN HFA) 108 (90 Base) MCG/ACT inhaler Inhale 2 puffs into the lungs every 6 (six) hours as needed for wheezing or shortness of breath. 1 each 5   aspirin 81 MG chewable tablet Chew 1 tablet (81 mg total) by mouth daily. 90 tablet 3   atorvastatin (LIPITOR) 40 MG tablet Take 1 tablet (40 mg total) by mouth daily. 90 tablet 3   cetirizine (ZYRTEC) 10 MG tablet Take 1 tablet (10 mg total) by mouth daily. (Patient not taking: Reported on 10/12/2022) 90 tablet 1   clopidogrel (PLAVIX) 75 MG tablet Take 1 tablet (75 mg total) by mouth daily. 90 tablet 3   colchicine 0.6 MG tablet Take 1 tablet (0.6 mg total) by mouth daily. 30 tablet 1   dapagliflozin propanediol (FARXIGA) 10 MG TABS tablet Take 1 tablet (10 mg total) by mouth daily before breakfast. 90 tablet 3   EPIPEN 2-PAK 0.3 MG/0.3ML SOAJ injection INJECT INTO THIGH MUSCLE THROUGH CLOTHES AS NEEDED SEVERE ALLERGIC REACTION 1 each 1   fluticasone (FLONASE) 50 MCG/ACT nasal spray 1 spray by Each Nare route daily. (Patient not taking: Reported on 10/12/2022) 16 g 6   hydrOXYzine (ATARAX) 25 MG tablet Take 1 tablet (25 mg total) by mouth 3 (three) times daily as needed for anxiety. 30 tablet 1   methimazole (TAPAZOLE) 5 MG tablet Take by mouth.     metoprolol succinate (TOPROL XL) 25 MG 24 hr tablet Take 0.5 tablets (12.5 mg total) by mouth daily. 45 tablet 3   montelukast (SINGULAIR) 10 MG  tablet Take 1 tablet (10 mg total) by mouth daily. 90 tablet 1   Multiple Vitamins-Minerals (CENTRUM ADULTS PO) Take 1 tablet by mouth at bedtime.      nicotine (NICODERM CQ - DOSED IN MG/24 HOURS) 21 mg/24hr patch Place 1 patch (21 mg total) onto the skin daily. 28 patch 0   pantoprazole (PROTONIX) 40 MG tablet Take 1 tablet (40 mg total) by mouth daily. 30 tablet 0   phentermine (ADIPEX-P) 37.5 MG tablet Take 1 tablet (37.5 mg total) by mouth daily before breakfast. (Patient not taking: Reported on 10/12/2022) 30 tablet 0   No current facility-administered medications for this visit.    Physical Exam: BP 119/75   Pulse (!) 110   Resp 20   Ht 5\' 7"  (1.702 m)   Wt 221 lb (100.2 kg)   LMP 08/22/2017 Comment: says she just spots.  SpO2 95% Comment: RA  BMI 34.61 kg/m  She looks well. Cardiac exam shows regular rate and rhythm with normal heart sounds.  There is no murmur. Lungs are clear. The chest incision is healing well and the sternum is stable. There is no peripheral edema.  Diagnostic Tests:  Narrative & Impression  CLINICAL DATA:  s/p cabg   EXAM: CHEST - 2 VIEW   COMPARISON:  09/30/2022   FINDINGS: Patient is status post median sternotomy/coronary bypass. Heart is enlarged. Resolving bibasilar atelectasis. No effusion or pneumothorax.  Trachea midline.   IMPRESSION: Cardiomegaly without acute process.     Electronically Signed   By: Judie Petit.  Shick M.D.   On: 10/13/2022 10:51    Impression:  She is doing well 6 weeks following her surgery.  I told her she can return to driving a car but should refrain lifting anything heavier than 10 pounds for 3 months postoperatively.  I encouraged her to continue increasing her activity to build up more stamina.  I also advised her to completely abstain from smoking.  Plan:  She will continue to follow-up with cardiology and will return to see me if she has any problems with her incision.   Alleen Borne, MD [facility] [phone]

## 2022-10-13 NOTE — Telephone Encounter (Signed)
The patient has been made aware and verbalized her understanding.   urth, Cadence H, PA-C  You44 minutes ago (3:43 PM)    She can take protonix with plavix. It is the best option for PPI and Plavix. We can send in a patch as well if she needs it

## 2022-10-13 NOTE — Telephone Encounter (Signed)
Pt states that after her appointment yesterday her medications were not called in correctly. Pt states she did not get the patch she needed either. Please advise.

## 2022-10-13 NOTE — Telephone Encounter (Signed)
The patient called in stating that all of her medications were not sent in yesterday. Those have been sent in today.  She also stated that she was told that Marcelline Deist was a diabetic medication so she was not going to take it. She has been educated on the cardiac use of this medication and has been advised to go back to the pharmacy to take the medication.  She then stated that the pharmacy informed her that protonix and plavix are contraindicated and she was not going to stop the protonix. She has been advised that is would be better to change her acid reflux medication rather than not take the plavix.

## 2022-10-14 ENCOUNTER — Ambulatory Visit: Payer: MEDICAID | Admitting: Family

## 2022-10-14 VITALS — BP 128/79 | HR 123 | Ht 67.0 in | Wt 221.4 lb

## 2022-10-14 DIAGNOSIS — G4733 Obstructive sleep apnea (adult) (pediatric): Secondary | ICD-10-CM

## 2022-10-14 DIAGNOSIS — G4700 Insomnia, unspecified: Secondary | ICD-10-CM | POA: Diagnosis not present

## 2022-10-14 DIAGNOSIS — K8012 Calculus of gallbladder with acute and chronic cholecystitis without obstruction: Secondary | ICD-10-CM

## 2022-10-14 DIAGNOSIS — F419 Anxiety disorder, unspecified: Secondary | ICD-10-CM | POA: Diagnosis not present

## 2022-10-14 MED ORDER — CLONAZEPAM 0.5 MG PO TABS: 0.5000 mg | ORAL_TABLET | Freq: Two times a day (BID) | ORAL | 0 refills | Status: DC | PRN

## 2022-10-18 ENCOUNTER — Telehealth: Payer: Self-pay

## 2022-10-18 ENCOUNTER — Other Ambulatory Visit (HOSPITAL_COMMUNITY): Payer: Self-pay

## 2022-10-18 NOTE — Telephone Encounter (Signed)
Pharmacy Patient Advocate Encounter   Received notification from CoverMyMeds that prior authorization for [facility] is required/requested.   Insurance verification completed.   The patient is insured through [facility] .   PA submitted KEY:[id number] Status is pending

## 2022-10-19 ENCOUNTER — Telehealth: Payer: Self-pay

## 2022-10-19 NOTE — Telephone Encounter (Signed)
Pharmacy Patient Advocate Encounter  Received notification from [facility] [employer] that Prior Authorization for Marcelline Deist has been APPROVED from 7.16.24 to 7.16.25.Marland Kitchen  Please see Approval letter in pts media

## 2022-10-19 NOTE — Telephone Encounter (Signed)
Patient is requesting to get a call before new medication has been sent in

## 2022-10-20 ENCOUNTER — Emergency Department
Admission: EM | Admit: 2022-10-20 | Discharge: 2022-10-21 | Disposition: A | Discharge status: Home / Self Care | Payer: MEDICAID | Attending: Emergency Medicine | Admitting: Emergency Medicine

## 2022-10-20 ENCOUNTER — Other Ambulatory Visit: Payer: Self-pay

## 2022-10-20 ENCOUNTER — Emergency Department: Payer: MEDICAID

## 2022-10-20 DIAGNOSIS — R202 Paresthesia of skin: Secondary | ICD-10-CM | POA: Diagnosis present

## 2022-10-20 DIAGNOSIS — Z951 Presence of aortocoronary bypass graft: Secondary | ICD-10-CM | POA: Insufficient documentation

## 2022-10-20 DIAGNOSIS — E876 Hypokalemia: Secondary | ICD-10-CM | POA: Diagnosis not present

## 2022-10-20 LAB — BASIC METABOLIC PANEL
Anion gap: 10 (ref 5–15)
BUN: 6 mg/dL (ref 6–20)
CO2: 18 mmol/L — ABNORMAL LOW (ref 22–32)
Calcium: 9.2 mg/dL (ref 8.9–10.3)
Chloride: 107 mmol/L (ref 98–111)
Creatinine, Ser: 0.67 mg/dL (ref 0.44–1.00)
GFR, Estimated: >60 mL/min (ref >60)
Glucose, Bld: 136 mg/dL — ABNORMAL HIGH (ref 70–99)
Potassium: 3.3 mmol/L — ABNORMAL LOW (ref 3.5–5.1)
Sodium: 135 mmol/L (ref 135–145)

## 2022-10-20 LAB — TROPONIN I (HIGH SENSITIVITY): Troponin I (High Sensitivity): 11 ng/L (ref <18)

## 2022-10-20 LAB — CBC
HCT: 46.5 % — ABNORMAL HIGH (ref 36.0–46.0)
Hemoglobin: 15 g/dL (ref 12.0–15.0)
MCH: 27.7 pg (ref 26.0–34.0)
MCHC: 32.3 g/dL (ref 30.0–36.0)
MCV: 86 fL (ref 80.0–100.0)
Platelets: 329 10*3/uL (ref 150–400)
RBC: 5.41 MIL/uL — ABNORMAL HIGH (ref 3.87–5.11)
RDW: 13.2 % (ref 11.5–15.5)
WBC: 10.8 10*3/uL — ABNORMAL HIGH (ref 4.0–10.5)
nRBC: 0 % (ref 0.0–0.2)

## 2022-10-20 MED ORDER — POTASSIUM CHLORIDE 20 MEQ PO PACK
40.0000 meq | PACK | Freq: Once | ORAL | Status: AC
  Administered 2022-10-21: 40 meq via ORAL
  Filled 2022-10-20: qty 2

## 2022-10-20 MED ORDER — LIDOCAINE 5 % EX PTCH
1.0000 | MEDICATED_PATCH | CUTANEOUS | Status: DC
  Administered 2022-10-20: 1 via TRANSDERMAL
  Filled 2022-10-20: qty 1

## 2022-10-20 MED ORDER — POTASSIUM CHLORIDE CRYS ER 20 MEQ PO TBCR
40.0000 meq | EXTENDED_RELEASE_TABLET | Freq: Once | ORAL | Status: DC
  Filled 2022-10-20: qty 2

## 2022-10-20 NOTE — ED Triage Notes (Signed)
Pt presents ambulatory to triage via POV with complaints of L arm pain with associated CP x 2 days. Pt notes a hx of MI and bypass. She is detoxing off Klonopin for the last 2 days and her sx began. A&Ox4 at this time. Denies syncope, LOC, or SOB.

## 2022-10-20 NOTE — ED Provider Notes (Signed)
[facility] Provider Note    Event Date/Time   First MD Initiated Contact with Patient 10/20/22 2324     (approximate)   History   Chest Pain   HPI  Elizabeth Spencer is a 37 y.o. female who presents to the ED for evaluation of arm paresthesias   I reviewed with CT surgery clinic visit from 1 week ago.  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  Patient presents for evaluation of left arm paresthesias.  She adamantly denies that she has any chest pain.  No dyspnea.  Reports tingling sensation and "feeling weird" to her left arm intermittently for a few days, more constantly in the past few hours tonight.  No weakness to the hand, falls or injuries, fevers   Physical Exam   Triage Vital Signs: ED Triage Vitals [10/20/22 2143]  Encounter Vitals Group     BP 128/80     Systolic BP Percentile      Diastolic BP Percentile      Pulse Rate (!) 120     Resp 20     Temp 98 F (36.7 C)     Temp Source Oral     SpO2 98 %     Weight 221 lb 12.5 oz (100.6 kg)     Height 5\' 7"  (1.702 m)     Head Circumference      Peak Flow      Pain Score 7     Pain Loc      Pain Education      Exclude from Growth Chart     Most recent vital signs: Vitals:   10/20/22 2348 10/21/22 0051  BP: 113/76 112/78  Pulse: (!) 108 (!) 102  Resp: (!) 22 18  Temp:    SpO2: 100% 98%    General: Awake, no distress.  CV:  Good peripheral perfusion.  Resp:  Normal effort.  Abd:  No distention.  MSK:  No deformity noted.  Neuro:  No focal deficits appreciated. Cranial nerves II through XII intact 5/5 strength and sensation in all 4 extremities Other:  Tenderness to palpation of her left trapezius muscle without overlying skin changes or signs of trauma. Sensation light touch intact throughout the left arm in all dermatomes, strength intact and symmetric to the right   ED Results / Procedures / Treatments   Labs (all labs ordered are listed, but only abnormal results are  displayed) Labs Reviewed  BASIC METABOLIC PANEL - Abnormal; Notable for the following components:      Result Value   Potassium 3.3 (*)    CO2 18 (*)    Glucose, Bld 136 (*)    All other components within normal limits  CBC - Abnormal; Notable for the following components:   WBC 10.8 (*)    RBC 5.41 (*)    HCT 46.5 (*)    All other components within normal limits  TROPONIN I (HIGH SENSITIVITY)  TROPONIN I (HIGH SENSITIVITY)    EKG Sinus tachycardia with a rate of 135 bpm.  Normal axis and intervals.  No clear signs of acute ischemia.  Similar to previous comparisons from 7/9 and 6/27  RADIOLOGY CXR interpreted by me without evidence of acute cardiopulmonary pathology.  Official radiology report(s): DG Chest 2 View  Result Date: 10/20/2022 CLINICAL DATA:  Chest pain EXAM: CHEST - 2 VIEW COMPARISON:  10/13/2022 FINDINGS: Lungs are clear. No pneumothorax or pleural effusion. Coronary artery bypass grafting has been performed. Cardiac  size within normal limits. Pulmonary vascularity is normal. No acute bone abnormality. IMPRESSION: No active cardiopulmonary disease. Electronically Signed   By: Helyn Numbers M.D.   On: 10/20/2022 22:13    PROCEDURES and INTERVENTIONS:  .1-3 Lead EKG Interpretation  Performed by: Delton Prairie, MD Authorized by: Delton Prairie, MD     Interpretation: abnormal     ECG rate:  110   ECG rate assessment: tachycardic     Rhythm: sinus tachycardia     Ectopy: none     Conduction: normal     Medications  lidocaine (LIDODERM) 5 % 1 patch (1 patch Transdermal Patch Applied 10/20/22 2347)  potassium chloride (KLOR-CON) packet 40 mEq (40 mEq Oral Given 10/21/22 0019)     IMPRESSION / MDM / ASSESSMENT AND PLAN / ED COURSE  I reviewed the triage vital signs and the nursing notes.  Differential diagnosis includes, but is not limited to, ACS, muscular spasm, electrolyte derangement, pneumothorax  {Patient presents with symptoms of an acute illness or  injury that is potentially life-threatening.  Patient presents with left arm paresthesias, possibly related to his left trapezius muscular spasm, with a benign workup otherwise and suitable for outpatient management.  EKG with a sinus tachycardia, consistent with all previous EKGs.  PE is less likely.  Mild hypokalemia is replaced orally.  2 negative troponins and normal hemoglobin.  CXR is clear.  While I considered observation admission for this patient, she has no pain or clear anginal equivalents, has a benign workup and I think outpatient management is reasonable.  Clinical Course as of 10/21/22 0211  Thu Oct 21, 2022  0047 Reassessed.  Feeling better.  Discussed management at home, PCP follow-up and return precautions [DS]    Clinical Course User Index [DS] Delton Prairie, MD     FINAL CLINICAL IMPRESSION(S) / ED DIAGNOSES   Final diagnoses:  Paresthesias     Rx / DC Orders   ED Discharge Orders          Ordered    lidocaine (LIDODERM) 5 %  Every 12 hours        10/21/22 0043             Note:  This document was prepared using Dragon voice recognition software and may include unintentional dictation errors.   Delton Prairie, MD 10/21/22 [phone]

## 2022-10-21 ENCOUNTER — Telehealth: Payer: Self-pay

## 2022-10-21 ENCOUNTER — Other Ambulatory Visit: Payer: Self-pay | Admitting: *Deleted

## 2022-10-21 LAB — TROPONIN I (HIGH SENSITIVITY): Troponin I (High Sensitivity): 12 ng/L (ref <18)

## 2022-10-21 MED ORDER — LIDOCAINE 5 % EX PTCH: 1.0000 | MEDICATED_PATCH | Freq: Two times a day (BID) | CUTANEOUS | 0 refills | Status: DC

## 2022-10-21 MED ORDER — ALPRAZOLAM ER 0.5 MG PO TB24: 0.5000 mg | ORAL_TABLET | Freq: Two times a day (BID) | ORAL | 1 refills | Status: DC | PRN

## 2022-10-21 MED ORDER — METHIMAZOLE 5 MG PO TABS: 5.0000 mg | ORAL_TABLET | Freq: Every day | ORAL | 1 refills | Status: DC

## 2022-10-21 NOTE — Telephone Encounter (Signed)
Spoke with pt who verbalized understanding.

## 2022-10-21 NOTE — Telephone Encounter (Signed)
Meds Sent

## 2022-10-21 NOTE — Discharge Instructions (Addendum)
Use Tylenol for pain and fevers.  Up to 1000 mg per dose, up to 4 times per day.  Do not take more than 4000 mg of Tylenol/acetaminophen within 24 hours.. ° °Please use lidocaine patches at your site of pain.  Apply 1 patch at a time, leave on for 12 hours, then remove for 12 hours.  12 hours on, 12 hours off.  Do not apply more than 1 patch at a time. ° °

## 2022-10-24 ENCOUNTER — Encounter: Payer: Self-pay | Admitting: Family

## 2022-10-24 DIAGNOSIS — G4733 Obstructive sleep apnea (adult) (pediatric): Secondary | ICD-10-CM | POA: Insufficient documentation

## 2022-10-24 DIAGNOSIS — G4700 Insomnia, unspecified: Secondary | ICD-10-CM | POA: Insufficient documentation

## 2022-10-24 DIAGNOSIS — K8012 Calculus of gallbladder with acute and chronic cholecystitis without obstruction: Secondary | ICD-10-CM | POA: Insufficient documentation

## 2022-10-24 NOTE — Assessment & Plan Note (Signed)
setting up referral to GI for pt.  Will defer to them for further treatment.

## 2022-10-24 NOTE — Assessment & Plan Note (Signed)
referring pt. for sleep study.  Will await results.

## 2022-10-24 NOTE — Progress Notes (Signed)
Established Patient Office Visit  Subjective:  Patient ID: Atticus Lemberger, female    DOB: 10/03/1985  Age: 37 y.o. MRN: [id number]  Chief Complaint  Patient presents with   Follow-up    Hospital F/U    Patient  here for follow up after her recent hospitalization.  She ended up needing to have a CABG, and was found to be in heart failure.   She did not know that her Father has had a triple bypass, Heart Disease, and Emphysema.   She says that her biggest concerns are getting set up for a sleep study, as there has been concern about possible sleep apnea She also says that she has been having a lot of difficulty since she got out of the hospital with sleeping.  She was given alprazolam when she was in the hospital to help with sleep, and she asks if we can give her something else to help her sleep.    No other concerns at this time.   Past Medical History:  Diagnosis Date   Aneurysm (HCC) 01/04/2014   Arthritis    Asthma    WELL CONTROLLED   Brain aneurysm 2007   NEUROLOGY NOTE DOES NOT MENTION ANEURYSM BUT PT STATES SHE DID NOT HAVE TO HAVE SURGERY   Complication of anesthesia    FOR 1 C-SECTION PT WAS ITCHING AND VERY RED ON HER FACE   Cough, persistent 01/27/2016   Dermatitis due to sunburn 11/08/2013   Family history of adverse reaction to anesthesia    brother, neice and nephew got red in face with hives   Gallstones    GERD (gastroesophageal reflux disease)    Gonorrhea 06/20/2020   Headache    CHRONIC HEADACHES   History of chronic cough    DRY   Loss of memory 05/30/2017   Perforation of left tympanic membrane 05/17/2013   Pneumonia    PTSD (post-traumatic stress disorder)    PTSD (post-traumatic stress disorder)    Thyroid condition    PT WAS JUST TOLD ON 08-25-17 THAT SHE HAS A THYROID PROBLEM AND IS GOING TO F/U WITH ENDOCRINOLOGIST IN 2 WEEKS   Trichomonas vaginalis (TV) infection 06/20/2020    Past Surgical History:  Procedure Laterality Date    ABDOMINAL HYSTERECTOMY     CESAREAN SECTION     x3   CORONARY ARTERY BYPASS GRAFT N/A 09/02/2022   Procedure: CORONARY ARTERY BYPASS GRAFTING (CABG) X 1 USING LEFT INTERNAL MAMMARY ARTERY;  Surgeon: Alleen Borne, MD;  Location[facility];  Service: Open Heart Surgery;  Laterality: N/A;   CYSTOSCOPY N/A 09/29/2017   Procedure: CYSTOSCOPY;  Surgeon: Vena Austria, MD;  Location: [facility] ORS;  Service: Gynecology;  Laterality: N/A;   CYSTOSCOPY N/A 11/05/2017   Procedure: CYSTOSCOPY;  Surgeon: Vena Austria, MD;  Location: [facility] ORS;  Service: Gynecology;  Laterality: N/A;   HYSTEROSCOPY WITH D & C N/A 09/01/2017   Procedure: DILATATION AND CURETTAGE /HYSTEROSCOPY;  Surgeon: Vena Austria, MD;  Location: [facility] ORS;  Service: Gynecology;  Laterality: N/A;   LAPAROSCOPY N/A 09/01/2017   Procedure: LAPAROSCOPY OPERATIVE with biopsy;  Surgeon: Vena Austria, MD;  Location: [facility] OR[facility];  Service: Gynecology;  Laterality: N/A;   LAPAROSCOPY N/A 11/05/2017   Procedure: LAPAROSCOPY DIAGNOSTIC;  Surgeon: Vena Austria, MD;  Location: [facility] ORS;  Service: Gynecology;  Laterality: N/A;   LEFT HEART CATH AND CORONARY ANGIOGRAPHY N/A 09/01/2022   Procedure: LEFT HEART CATH AND CORONARY ANGIOGRAPHY;  Surgeon: Yvonne Kendall, MD;  Location: [facility] IN[facility];  Service: Cardiovascular;  Laterality: N/A;   REPAIR VAGINAL CUFF N/A 11/05/2017   Procedure: REPAIR VAGINAL CUFF;  Surgeon: Vena Austria, MD;  Location: [facility] OR[facility];  Service: Gynecology;  Laterality: N/A;   TEE WITHOUT CARDIOVERSION N/A 09/02/2022   Procedure: TRANSESOPHAGEAL ECHOCARDIOGRAM;  Surgeon: Alleen Borne, MD;  [facility];  Service: Open Heart Surgery;  Laterality: N/A;   TOTAL LAPAROSCOPIC HYSTERECTOMY WITH SALPINGECTOMY Bilateral 09/29/2017   Procedure: TOTAL LAPAROSCOPIC HYSTERECTOMY WITH SALPINGECTOMY;  Surgeon: Vena Austria, MD;  Location: [facility] ORS;  Service: Gynecology;  Laterality: Bilateral;    Social History    Socioeconomic History   Marital status: Single    Spouse name: Not on file   Number of children: Not on file   Years of education: Not on file   Highest education level: Not on file  Occupational History   Not on file  Tobacco Use   Smoking status: Every Day    Current packs/day: 0.50    Average packs/day: 0.5 packs/day for 18.0 years (9.0 ttl pk-yrs)    Types: Cigarettes   Smokeless tobacco: Never  Vaping Use   Vaping status: Never Used  Substance and Sexual Activity   Alcohol use: Yes    Alcohol/week: 0.0 standard drinks of alcohol    Comment: occasionally   Drug use: Not Currently    Types: Marijuana, Cocaine   Sexual activity: Yes    Partners: Male    Birth control/protection: Surgical    Comment: Hysterectomy  Other Topics Concern   Not on file  Social History Narrative   Not on file   Social Determinants of Health   Financial Resource Strain: Not on file  Food Insecurity: No Food Insecurity (09/01/2022)   Hunger Vital Sign    Worried About Running Out of Food in the Last Year: Never true    Ran Out of Food in the Last Year: Never true  Transportation Needs: No Transportation Needs (09/01/2022)   PRAPARE - Administrator, Civil Service (Medical): No    Lack of Transportation (Non-Medical): No  Physical Activity: Not on file  Stress: Not on file  Social Connections: Not on file  Intimate Partner Violence: Not At Risk (09/01/2022)   Humiliation, Afraid, Rape, and Kick questionnaire    Fear of Current or Ex-Partner: No    Emotionally Abused: No    Physically Abused: No    Sexually Abused: No    Family History  Problem Relation Age of Onset   Hypertension Mother    Alcohol abuse Mother    Deep vein thrombosis Mother    Alcohol abuse Father    COPD Father    Emphysema Father    Heart disease Father     Allergies  Allergen Reactions   Azithromycin Swelling and Other (See Comments)   Clindamycin Swelling   Codeine Hives and Other (See  Comments)    Nausea/dizzy Nausea/dizzy   Meloxicam Other (See Comments)    Burns up from the inside    Neurontin [Gabapentin] Other (See Comments)    Makes her pass out and not remember what happened prior to taking med.   Penicillins Anaphylaxis    Patient allergic to all "cillins" Has patient had a PCN reaction causing immediate rash, facial/tongue/throat swelling, SOB or lightheadedness with hypotension: Yes Has patient had a PCN reaction causing severe rash involving mucus membranes or skin necrosis: Yes Has patient had a PCN reaction that required hospitalization: No Has patient had a PCN reaction  occurring within the last 10 years: Yes If all of the above answers are "NO", then may proceed with Cephalosporin use.    Clindamycin/Lincomycin Swelling   Other Hives and Other (See Comments)    Berry flavored food and drinks   Diphenhydramine Hcl Rash and Other (See Comments)   Doxycycline Rash   Egg-Derived Products Rash    Review of Systems  Cardiovascular:  Positive for chest pain.  Psychiatric/Behavioral:  Positive for depression. The patient is nervous/anxious and has insomnia.   All other systems reviewed and are negative.      Objective:   BP 128/79   Pulse (!) 123   Ht 5\' 7"  (1.702 m)   Wt 221 lb 6.4 oz (100.4 kg)   LMP 08/22/2017 Comment: says she just spots.  SpO2 97%   BMI 34.68 kg/m   Vitals:   10/14/22 1415  BP: 128/79  Pulse: (!) 123  Height: 5\' 7"  (1.702 m)  Weight: 221 lb 6.4 oz (100.4 kg)  SpO2: 97%  BMI (Calculated): 34.67    Physical Exam Vitals and nursing note reviewed.  Constitutional:      Appearance: Normal appearance. She is normal weight.  HENT:     Head: Normocephalic.  Eyes:     Extraocular Movements: Extraocular movements intact.     Conjunctiva/sclera: Conjunctivae normal.     Pupils: Pupils are equal, round, and reactive to light.  Cardiovascular:     Rate and Rhythm: Normal rate.  Pulmonary:     Effort: Pulmonary effort  is normal.  Neurological:     Mental Status: She is alert.  Psychiatric:        Attention and Perception: Attention and perception normal.        Mood and Affect: Affect normal. Mood is anxious.        Speech: Speech normal.        Behavior: Behavior normal. Behavior is cooperative.        Thought Content: Thought content normal.        Cognition and Memory: Cognition and memory normal.        Judgment: Judgment normal.      No results found for any visits on 10/14/22.  Recent Results (from the past 2160 hour(s))  Basic metabolic panel     Status: Abnormal   Collection Time: 08/31/22  4:33 PM  Result Value Ref Range   Sodium 139 135 - 145 mmol/L   Potassium 3.5 3.5 - 5.1 mmol/L   Chloride 108 98 - 111 mmol/L   CO2 24 22 - 32 mmol/L   Glucose, Bld 109 (H) 70 - 99 mg/dL    Comment: Glucose reference range applies only to samples taken after fasting for at least 8 hours.   BUN 7 6 - 20 mg/dL   Creatinine, Ser 4.78 0.44 - 1.00 mg/dL   Calcium 8.9 8.9 - 29.5 mg/dL   GFR, Estimated >62 >13 mL/min    Comment: (NOTE) Calculated using the CKD-EPI Creatinine Equation (2021)    Anion gap 7 5 - 15    Comment: Performed at [facility], [address] [place], [place] [address]  CBC     Status: None   Collection Time: 08/31/22  4:33 PM  Result Value Ref Range   WBC 8.8 4.0 - 10.5 K/uL   RBC 4.83 3.87 - 5.11 MIL/uL   Hemoglobin 13.7 12.0 - 15.0 g/dL   HCT 84.6 96.2 - 95.2 %   MCV 89.6 80.0 - 100.0 fL  MCH 28.4 26.0 - 34.0 pg   MCHC 31.6 30.0 - 36.0 g/dL   RDW 86.5 78.4 - 69.6 %   Platelets 287 150 - 400 K/uL   nRBC 0.0 0.0 - 0.2 %    Comment: Performed at [facility], [address] [place], [place] [address]  Troponin I (High Sensitivity)     Status: Abnormal   Collection Time: 08/31/22  4:33 PM  Result Value Ref Range   Troponin I (High Sensitivity) 317 (HH) <18 ng/L    Comment: CRITICAL RESULT CALLED TO, READ BACK BY AND VERIFIED WITH JACQUE  FERGUSON 08/31/22 1712 MU (NOTE) Elevated high sensitivity troponin I (hsTnI) values and significant  changes across serial measurements may suggest ACS but many other  chronic and acute conditions are known to elevate hsTnI results.  Refer to the Links section for chest pain algorithms and additional  guidance. Performed at [facility], [address] [place], [place] [address]   Hepatic function panel     Status: None   Collection Time: 08/31/22  4:33 PM  Result Value Ref Range   Total Protein 6.5 6.5 - 8.1 g/dL   Albumin 3.5 3.5 - 5.0 g/dL   AST 38 15 - 41 U/L   ALT 36 0 - 44 U/L   Alkaline Phosphatase 71 38 - 126 U/L   Total Bilirubin 0.4 0.3 - 1.2 mg/dL   Bilirubin, Direct <4.0 0.0 - 0.2 mg/dL   Indirect Bilirubin NOT CALCULATED 0.3 - 0.9 mg/dL    Comment: Performed at [facility], [address] [place], [place] [address]  Gamma GT     Status: None   Collection Time: 08/31/22  4:33 PM  Result Value Ref Range   GGT 24 7 - 50 U/L    Comment: Performed at [facility], [address], [place], [place] [address]  Lipase, blood     Status: None   Collection Time: 08/31/22  4:33 PM  Result Value Ref Range   Lipase 30 11 - 51 U/L    Comment: Performed at [facility], [address] [place], [place] [address]  Hemoglobin A1c     Status: None   Collection Time: 08/31/22  4:33 PM  Result Value Ref Range   Hgb A1c MFr Bld 5.2 4.8 - 5.6 %    Comment: (NOTE)         Prediabetes: 5.7 - 6.4         Diabetes: >6.4         Glycemic control for adults with diabetes: <7.0    Mean Plasma Glucose 103 mg/dL    Comment: (NOTE) Performed At: [facility] [address] [place], [place] [id number] Jolene Schimke MD [id number]   Troponin I (High Sensitivity)     Status: Abnormal   Collection Time: 08/31/22  6:50 PM  Result Value Ref Range   Troponin I (High Sensitivity) 576 (HH) <18 ng/L    Comment: CRITICAL VALUE NOTED. VALUE IS  CONSISTENT WITH PREVIOUSLY REPORTED/CALLED VALUE MU (NOTE) Elevated high sensitivity troponin I (hsTnI) values and significant  changes across serial measurements may suggest ACS but many other  chronic and acute conditions are known to elevate hsTnI results.  Refer to the "Links" section for chest pain algorithms and additional  guidance. Performed at [facility], [address] [place], [place] [address]   APTT     Status: None   Collection Time: 08/31/22  6:50 PM  Result  Value Ref Range   aPTT 27 24 - 36 seconds    Comment: Performed at [facility], [address] [place], [place] [address]  Protime-INR     Status: None   Collection Time: 08/31/22  6:50 PM  Result Value Ref Range   Prothrombin Time 13.2 11.4 - 15.2 seconds   INR 1.0 0.8 - 1.2    Comment: (NOTE) INR goal varies based on device and disease states. Performed at [facility], [address] [place], [place] [address]   Blood culture (routine x 2)     Status: Abnormal   Collection Time: 08/31/22  9:01 PM   Specimen: BLOOD LEFT HAND  Result Value Ref Range   Specimen Description      BLOOD LEFT HAND Performed at [facility], [address], [place], [place] [address]    Special Requests      BOTTLES DRAWN AEROBIC AND ANAEROBIC Blood Culture adequate volume Performed at [facility], [address] [place], [place] [address]    Culture  Setup Time      IN BOTH AEROBIC AND ANAEROBIC BOTTLES GRAM POSITIVE COCCI Organism ID to follow CRITICAL RESULT CALLED TO, READ BACK BY AND VERIFIED WITH: VERA MADUEMA 09/01/22 @ 1114 BY SB Performed at [facility], [address] [place], [place] [address]    Culture STREPTOCOCCUS MITIS/ORALIS (A)    Report Status 09/03/2022 FINAL    Organism ID, Bacteria STREPTOCOCCUS MITIS/ORALIS       Susceptibility   Streptococcus mitis/oralis - MIC*    TETRACYCLINE 0.5 SENSITIVE Sensitive     VANCOMYCIN 0.5 SENSITIVE  Sensitive     CLINDAMYCIN <=0.25 SENSITIVE Sensitive     * STREPTOCOCCUS MITIS/ORALIS  Blood Culture ID Panel (Reflexed)     Status: Abnormal   Collection Time: 08/31/22  9:01 PM  Result Value Ref Range   Enterococcus faecalis NOT DETECTED NOT DETECTED   Enterococcus Faecium NOT DETECTED NOT DETECTED   Listeria monocytogenes NOT DETECTED NOT DETECTED   Staphylococcus species NOT DETECTED NOT DETECTED   Staphylococcus aureus (BCID) NOT DETECTED NOT DETECTED   Staphylococcus epidermidis NOT DETECTED NOT DETECTED   Staphylococcus lugdunensis NOT DETECTED NOT DETECTED   Streptococcus species DETECTED (A) NOT DETECTED    Comment: Not Enterococcus species, Streptococcus agalactiae, Streptococcus pyogenes, or Streptococcus pneumoniae. CRITICAL RESULT CALLED TO, READ BACK BY AND VERIFIED WITH: VERA MADUEME 09/01/22 @ 1114 BY SB    Streptococcus agalactiae NOT DETECTED NOT DETECTED   Streptococcus pneumoniae NOT DETECTED NOT DETECTED   Streptococcus pyogenes NOT DETECTED NOT DETECTED   A.calcoaceticus-baumannii NOT DETECTED NOT DETECTED   Bacteroides fragilis NOT DETECTED NOT DETECTED   Enterobacterales NOT DETECTED NOT DETECTED   Enterobacter cloacae complex NOT DETECTED NOT DETECTED   Escherichia coli NOT DETECTED NOT DETECTED   Klebsiella aerogenes NOT DETECTED NOT DETECTED   Klebsiella oxytoca NOT DETECTED NOT DETECTED   Klebsiella pneumoniae NOT DETECTED NOT DETECTED   Proteus species NOT DETECTED NOT DETECTED   Salmonella species NOT DETECTED NOT DETECTED   Serratia marcescens NOT DETECTED NOT DETECTED   Haemophilus influenzae NOT DETECTED NOT DETECTED   Neisseria meningitidis NOT DETECTED NOT DETECTED   Pseudomonas aeruginosa NOT DETECTED NOT DETECTED   Stenotrophomonas maltophilia NOT DETECTED NOT DETECTED   Candida albicans NOT DETECTED NOT DETECTED   Candida auris NOT DETECTED NOT DETECTED   Candida glabrata NOT DETECTED NOT DETECTED   Candida krusei NOT DETECTED NOT DETECTED    Candida parapsilosis  NOT DETECTED NOT DETECTED   Candida tropicalis NOT DETECTED NOT DETECTED   Cryptococcus neoformans/gattii NOT DETECTED NOT DETECTED    Comment: Performed at [facility], [address] [place], [place] [address]  Urine Drug Screen, Qualitative [facility] only)     Status: Abnormal   Collection Time: 08/31/22 10:00 PM  Result Value Ref Range   Tricyclic, Ur Screen NONE DETECTED NONE DETECTED   Amphetamines, Ur Screen NONE DETECTED NONE DETECTED   MDMA (Ecstasy)Ur Screen NONE DETECTED NONE DETECTED   Cocaine Metabolite,Ur [place] POSITIVE (A) NONE DETECTED   Opiate, Ur Screen NONE DETECTED NONE DETECTED   Phencyclidine (PCP) Ur S NONE DETECTED NONE DETECTED   Cannabinoid 50 Ng, Ur [place] NONE DETECTED NONE DETECTED   Barbiturates, Ur Screen NONE DETECTED NONE DETECTED   Benzodiazepine, Ur Scrn NONE DETECTED NONE DETECTED   Methadone Scn, Ur NONE DETECTED NONE DETECTED    Comment: (NOTE) Tricyclics + metabolites, urine    Cutoff 1000 ng/mL Amphetamines + metabolites, urine  Cutoff 1000 ng/mL MDMA (Ecstasy), urine              Cutoff 500 ng/mL Cocaine Metabolite, urine          Cutoff 300 ng/mL Opiate + metabolites, urine        Cutoff 300 ng/mL Phencyclidine (PCP), urine         Cutoff 25 ng/mL Cannabinoid, urine                 Cutoff 50 ng/mL Barbiturates + metabolites, urine  Cutoff 200 ng/mL Benzodiazepine, urine              Cutoff 200 ng/mL Methadone, urine                   Cutoff 300 ng/mL  The urine drug screen provides only a preliminary, unconfirmed analytical test result and should not be used for non-medical purposes. Clinical consideration and professional judgment should be applied to any positive drug screen result due to possible interfering substances. A more specific alternate chemical method must be used in order to obtain a confirmed analytical result. Gas chromatography / mass spectrometry (GC/MS) is the preferred confirm atory  method. Performed at [facility], [address] [place], [place] [address]   Troponin I (High Sensitivity)     Status: Abnormal   Collection Time: 08/31/22 10:00 PM  Result Value Ref Range   Troponin I (High Sensitivity) 1,031 (HH) <18 ng/L    Comment: CRITICAL VALUE NOTED. VALUE IS CONSISTENT WITH PREVIOUSLY REPORTED/CALLED VALUE RH (NOTE) Elevated high sensitivity troponin I (hsTnI) values and significant  changes across serial measurements may suggest ACS but many other  chronic and acute conditions are known to elevate hsTnI results.  Refer to the "Links" section for chest pain algorithms and additional  guidance. Performed at [facility], [address] [place], [place] [address]   T4, free     Status: Abnormal   Collection Time: 08/31/22 10:00 PM  Result Value Ref Range   Free T4 1.74 (H) 0.61 - 1.12 ng/dL    Comment: (NOTE) Biotin ingestion may interfere with free T4 tests. If the results are inconsistent with the TSH level, previous test results, or the clinical presentation, then consider biotin interference. If needed, order repeat testing after stopping biotin. Performed at [facility], [address] [place], [place] [address]   TSH     Status: Abnormal   Collection Time: 08/31/22 10:00 PM  Result Value Ref Range   TSH <0.010 (L) 0.350 - 4.500 uIU/mL    Comment: Performed by a 3rd Generation assay with a functional sensitivity of <=0.01 uIU/mL. Performed at [facility], [address] [place], [place] [address]   Lipid panel     Status: None   Collection Time: 08/31/22 10:00 PM  Result Value Ref Range   Cholesterol 135 0 - 200 mg/dL   Triglycerides 478 <295 mg/dL   HDL 46 >62 mg/dL   Total CHOL/HDL Ratio 2.9 RATIO   VLDL 22 0 - 40 mg/dL   LDL Cholesterol 67 0 - 99 mg/dL    Comment:        Total Cholesterol/HDL:CHD Risk Coronary Heart Disease Risk Table                     Men   Women  1/2 Average Risk   3.4    3.3  Average Risk       5.0   4.4  2 X Average Risk   9.6   7.1  3 X Average Risk  23.4   11.0        Use the calculated Patient Ratio above and the CHD Risk Table to determine the patient's CHD Risk.        ATP III CLASSIFICATION (LDL):  <100     mg/dL   Optimal  130-865  mg/dL   Near or Above                    Optimal  130-159  mg/dL   Borderline  784-696  mg/dL   High  >295     mg/dL   Very High Performed at [facility], [address] [place], [place] [address]   Troponin I (High Sensitivity)     Status: Abnormal   Collection Time: 09/01/22 12:06 AM  Result Value Ref Range   Troponin I (High Sensitivity) 1,163 (HH) <18 ng/L    Comment: CRITICAL VALUE NOTED. VALUE IS CONSISTENT WITH PREVIOUSLY REPORTED/CALLED VALUE RH (NOTE) Elevated high sensitivity troponin I (hsTnI) values and significant  changes across serial measurements may suggest ACS but many other  chronic and acute conditions are known to elevate hsTnI results.  Refer to the "Links" section for chest pain algorithms and additional  guidance. Performed at [facility], [address] [place], [place] [address]   Heparin level (unfractionated)     Status: Abnormal   Collection Time: 09/01/22  2:02 AM  Result Value Ref Range   Heparin Unfractionated <0.10 (L) 0.30 - 0.70 IU/mL    Comment: REPEATED TO VERIFY Performed at [facility], [address] [place], [place] [address]   CBC     Status: None   Collection Time: 09/01/22  2:02 AM  Result Value Ref Range   WBC 8.4 4.0 - 10.5 K/uL   RBC 4.23 3.87 - 5.11 MIL/uL   Hemoglobin 12.4 12.0 - 15.0 g/dL   HCT 36.6 44.0 - 34.7 %   MCV 91.0 80.0 - 100.0 fL   MCH 29.3 26.0 - 34.0 pg   MCHC 32.2 30.0 - 36.0 g/dL   RDW 42.5 95.6 - 38.7 %   Platelets 237 150 - 400 K/uL   nRBC 0.0 0.0 - 0.2 %    Comment: Performed at [facility], [address] [place], [place] [address]  Comprehensive metabolic panel     Status:  Abnormal   Collection Time:  09/01/22  2:02 AM  Result Value Ref Range   Sodium 138 135 - 145 mmol/L   Potassium 3.2 (L) 3.5 - 5.1 mmol/L   Chloride 108 98 - 111 mmol/L   CO2 25 22 - 32 mmol/L   Glucose, Bld 98 70 - 99 mg/dL    Comment: Glucose reference range applies only to samples taken after fasting for at least 8 hours.   BUN 7 6 - 20 mg/dL   Creatinine, Ser 4.40 0.44 - 1.00 mg/dL   Calcium 8.3 (L) 8.9 - 10.3 mg/dL   Total Protein 6.0 (L) 6.5 - 8.1 g/dL   Albumin 3.4 (L) 3.5 - 5.0 g/dL   AST 40 15 - 41 U/L   ALT 34 0 - 44 U/L   Alkaline Phosphatase 64 38 - 126 U/L   Total Bilirubin 0.5 0.3 - 1.2 mg/dL   GFR, Estimated >34 >74 mL/min    Comment: (NOTE) Calculated using the CKD-EPI Creatinine Equation (2021)    Anion gap 5 5 - 15    Comment: Performed at [facility], [address] [place], [place] [address]  Magnesium     Status: None   Collection Time: 09/01/22  2:02 AM  Result Value Ref Range   Magnesium 1.9 1.7 - 2.4 mg/dL    Comment: Performed at [facility], [address] [place], [place] [address]  Troponin I (High Sensitivity)     Status: Abnormal   Collection Time: 09/01/22  8:55 AM  Result Value Ref Range   Troponin I (High Sensitivity) 765 (HH) <18 ng/L    Comment: CRITICAL VALUE NOTED. VALUE IS CONSISTENT WITH PREVIOUSLY REPORTED/CALLED VALUE MW (NOTE) Elevated high sensitivity troponin I (hsTnI) values and significant  changes across serial measurements may suggest ACS but many other  chronic and acute conditions are known to elevate hsTnI results.  Refer to the "Links" section for chest pain algorithms and additional  guidance. Performed at [facility], [address] [place], [place] [address]   Phosphorus     Status: None   Collection Time: 09/01/22  8:55 AM  Result Value Ref Range   Phosphorus 4.0 2.5 - 4.6 mg/dL    Comment: Performed at [facility], [address] [place], [place] [address]  Blood  culture (routine x 2)     Status: None   Collection Time: 09/01/22  8:57 AM   Specimen: BLOOD  Result Value Ref Range   Specimen Description BLOOD RIGHT HAND    Special Requests      BOTTLES DRAWN AEROBIC AND ANAEROBIC Blood Culture adequate volume   Culture      NO GROWTH 5 DAYS Performed at [facility], [address], [place], [place] [address]    Report Status 09/06/2022 FINAL   Heparin level (unfractionated)     Status: Abnormal   Collection Time: 09/01/22  8:57 AM  Result Value Ref Range   Heparin Unfractionated <0.10 (L) 0.30 - 0.70 IU/mL    Comment: (NOTE) The clinical reportable range upper limit is being lowered to >1.10 to align with the FDA approved guidance for the current laboratory assay.  If heparin results are below expected values, and patient dosage has  been confirmed, suggest follow up testing of antithrombin III levels. Performed at [facility], [address], [place], [place] [address]   Troponin I (High Sensitivity)     Status: Abnormal   Collection Time: 09/01/22 10:07 AM  Result Value Ref Range  Troponin I (High Sensitivity) 687 (HH) <18 ng/L    Comment: CRITICAL VALUE NOTED. VALUE IS CONSISTENT WITH PREVIOUSLY REPORTED/CALLED VALUE MW (NOTE) Elevated high sensitivity troponin I (hsTnI) values and significant  changes across serial measurements may suggest ACS but many other  chronic and acute conditions are known to elevate hsTnI results.  Refer to the "Links" section for chest pain algorithms and additional  guidance. Performed at [facility], [address] [place], [place] [address]   ECHOCARDIOGRAM COMPLETE     Status: None   Collection Time: 09/01/22  2:38 PM  Result Value Ref Range   Weight 3,520.31 oz   Height 67 in   BP 90/66 mmHg   Ao pk vel 1.22 m/s   AV Area VTI 3.36 cm2   AR max vel 2.45 cm2   AV Mean grad 3.0 mmHg   AV Peak grad 6.0 mmHg   Single Plane A2C EF 30.3 %   Single Plane A4C EF  24.5 %   Calc EF 26.5 %   S' Lateral 4.50 cm   AV Area mean vel 2.63 cm2   Area-P 1/2 6.83 cm2   Est EF 30 - 35%   Surgical pcr screen     Status: None   Collection Time: 09/01/22  5:04 PM   Specimen: Nasal Mucosa; Nasal Swab  Result Value Ref Range   MRSA, PCR NEGATIVE NEGATIVE   Staphylococcus aureus NEGATIVE NEGATIVE    Comment: (NOTE) The Xpert SA Assay (FDA approved for NASAL specimens in patients 91 years of age and older), is one component of a comprehensive surveillance program. It is not intended to diagnose infection nor to guide or monitor treatment. Performed at [facility], [address], [place], [place] [address]   VAS US DOPPLER PRE CABG     Status: None   Collection Time: 09/01/22  5:38 PM  Result Value Ref Range   Right ABI 1.15    Left ABI 1.1   TSH     Status: Abnormal   Collection Time: 09/01/22  5:48 PM  Result Value Ref Range   TSH 0.018 (L) 0.350 - 4.500 uIU/mL    Comment: Performed by a 3rd Generation assay with a functional sensitivity of <=0.01 uIU/mL. Performed at [facility], [address], [place], [place] [address]   T4, free     Status: Abnormal   Collection Time: 09/01/22  5:48 PM  Result Value Ref Range   Free T4 2.13 (H) 0.61 - 1.12 ng/dL    Comment: (NOTE) Biotin ingestion may interfere with free T4 tests. If the results are inconsistent with the TSH level, previous test results, or the clinical presentation, then consider biotin interference. If needed, order repeat testing after stopping biotin. Performed at [facility], [address] [place], [place] [address]   Type and screen [facility]     Status: None   Collection Time: 09/01/22  6:23 PM  Result Value Ref Range   ABO/RH(D) A NEG    Antibody Screen NEG    Sample Expiration 09/04/2022,2359    Unit Number [id number]    Blood Component Type RBC LR PHER2    Unit division 00    Status of Unit REL FROM [facility]    Transfusion Status OK  TO TRANSFUSE    Crossmatch Result Compatible    Unit Number [id number]    Blood Component Type RED CELLS,LR    Unit division 00  Status of Unit REL FROM [facility]    Transfusion Status OK TO TRANSFUSE    Crossmatch Result      Compatible Performed at [facility], [address] [place], [place] [address]   Basic metabolic panel     Status: Abnormal   Collection Time: 09/01/22  6:23 PM  Result Value Ref Range   Sodium 140 135 - 145 mmol/L   Potassium 3.7 3.5 - 5.1 mmol/L   Chloride 107 98 - 111 mmol/L   CO2 21 (L) 22 - 32 mmol/L   Glucose, Bld 108 (H) 70 - 99 mg/dL    Comment: Glucose reference range applies only to samples taken after fasting for at least 8 hours.   BUN 5 (L) 6 - 20 mg/dL   Creatinine, Ser 6.04 0.44 - 1.00 mg/dL   Calcium 8.5 (L) 8.9 - 10.3 mg/dL   GFR, Estimated >54 >09 mL/min    Comment: (NOTE) Calculated using the CKD-EPI Creatinine Equation (2021)    Anion gap 12 5 - 15    Comment: Performed at [facility], [address], [place], [place] [address]  CBC     Status: None   Collection Time: 09/01/22  6:23 PM  Result Value Ref Range   WBC 9.1 4.0 - 10.5 K/uL   RBC 4.43 3.87 - 5.11 MIL/uL   Hemoglobin 13.2 12.0 - 15.0 g/dL   HCT 47.8 29.5 - 62.1 %   MCV 91.9 80.0 - 100.0 fL   MCH 29.8 26.0 - 34.0 pg   MCHC 32.4 30.0 - 36.0 g/dL   RDW 30.8 65.7 - 84.6 %   Platelets 233 150 - 400 K/uL   nRBC 0.0 0.0 - 0.2 %    Comment: Performed at [facility], [address], [place], [place] [address]  BPAM RBC     Status: None   Collection Time: 09/01/22  6:23 PM  Result Value Ref Range   ISSUE DATE / TIME [id number]    Blood Product Unit Number [id number]    PRODUCT CODE [id number]    Unit Type and Rh 0600    Blood Product Expiration Date 564332951884    Blood Product Unit Number [id number]    PRODUCT CODE [id number]    Unit Type and Rh 0600    Blood Product Expiration Date 202542706237   Lipoprotein A (LPA)     Status: None    Collection Time: 09/02/22  5:31 AM  Result Value Ref Range   Lipoprotein (a) 15.7 <75.0 nmol/L    Comment: (NOTE) Note:  Values greater than or equal to 75.0 nmol/L may       indicate an independent risk factor for CHD,       but must be evaluated with caution when applied       to non-Caucasian populations due to the       influence of genetic factors on Lp(a) across       ethnicities. Performed At: [facility] [address] [place], [place] [id number] Jolene Schimke MD [id number]   Basic metabolic panel     Status: Abnormal   Collection Time: 09/02/22  5:31 AM  Result Value Ref Range   Sodium 139 135 - 145 mmol/L   Potassium 3.8 3.5 - 5.1 mmol/L   Chloride 107 98 - 111 mmol/L   CO2 23 22 - 32 mmol/L   Glucose, Bld 101 (H) 70 - 99 mg/dL    Comment: Glucose reference range applies only  to samples taken after fasting for at least 8 hours.   BUN 6 6 - 20 mg/dL   Creatinine, Ser 8.29 0.44 - 1.00 mg/dL   Calcium 8.4 (L) 8.9 - 10.3 mg/dL   GFR, Estimated >56 >21 mL/min    Comment: (NOTE) Calculated using the CKD-EPI Creatinine Equation (2021)    Anion gap 9 5 - 15    Comment: Performed at [facility], [address], [place], [place] [address]  CBC     Status: Abnormal   Collection Time: 09/02/22  5:31 AM  Result Value Ref Range   WBC 7.5 4.0 - 10.5 K/uL   RBC 4.04 3.87 - 5.11 MIL/uL   Hemoglobin 11.8 (L) 12.0 - 15.0 g/dL   HCT 78.4 69.6 - 29.5 %   MCV 89.1 80.0 - 100.0 fL   MCH 29.2 26.0 - 34.0 pg   MCHC 32.8 30.0 - 36.0 g/dL   RDW 28.4 13.2 - 44.0 %   Platelets 216 150 - 400 K/uL   nRBC 0.0 0.0 - 0.2 %    Comment: Performed at [facility], [address], [place], [place] [address]  Prepare RBC (crossmatch)     Status: None   Collection Time: 09/02/22  6:33 AM  Result Value Ref Range   Order Confirmation      ORDER PROCESSED BY BLOOD BANK Performed at [facility], [address] [place], [place] [address]   ECHO INTRAOPERATIVE TEE      Status: None   Collection Time: 09/02/22  7:19 AM  Result Value Ref Range   Weight 3,693.15 oz   Height 67 in   BP 109/85 mmHg  I-STAT 7, (LYTES, BLD GAS, ICA, H+H)     Status: Abnormal   Collection Time: 09/02/22  8:06 AM  Result Value Ref Range   pH, Arterial 7.334 (L) 7.35 - 7.45   pCO2 arterial 44.6 32 - 48 mmHg   pO2, Arterial 150 (H) 83 - 108 mmHg   Bicarbonate 23.7 20.0 - 28.0 mmol/L   TCO2 25 22 - 32 mmol/L   O2 Saturation 99 %   Acid-base deficit 2.0 0.0 - 2.0 mmol/L   Sodium 139 135 - 145 mmol/L   Potassium 3.8 3.5 - 5.1 mmol/L   Calcium, Ion 1.29 1.15 - 1.40 mmol/L   HCT 34.0 (L) 36.0 - 46.0 %   Hemoglobin 11.6 (L) 12.0 - 15.0 g/dL   Sample type ARTERIAL   I-STAT, chem 8     Status: Abnormal   Collection Time: 09/02/22  8:14 AM  Result Value Ref Range   Sodium 139 135 - 145 mmol/L   Potassium 3.8 3.5 - 5.1 mmol/L   Chloride 106 98 - 111 mmol/L   BUN 4 (L) 6 - 20 mg/dL   Creatinine, Ser 4.03 (L) 0.44 - 1.00 mg/dL   Glucose, Bld 474 (H) 70 - 99 mg/dL    Comment: Glucose reference range applies only to samples taken after fasting for at least 8 hours.   Calcium, Ion 1.27 1.15 - 1.40 mmol/L   TCO2 25 22 - 32 mmol/L   Hemoglobin 11.2 (L) 12.0 - 15.0 g/dL   HCT 25.9 (L) 56.3 - 87.5 %  I-STAT, chem 8     Status: Abnormal   Collection Time: 09/02/22  9:50 AM  Result Value Ref Range   Sodium 137 135 - 145 mmol/L   Potassium 5.1 3.5 - 5.1 mmol/L   Chloride 106 98 -  111 mmol/L   BUN 5 (L) 6 - 20 mg/dL   Creatinine, Ser 2.44 (L) 0.44 - 1.00 mg/dL   Glucose, Bld 96 70 - 99 mg/dL    Comment: Glucose reference range applies only to samples taken after fasting for at least 8 hours.   Calcium, Ion 1.21 1.15 - 1.40 mmol/L   TCO2 29 22 - 32 mmol/L   Hemoglobin 11.9 (L) 12.0 - 15.0 g/dL   HCT 01.0 (L) 27.2 - 53.6 %  I-STAT 7, (LYTES, BLD GAS, ICA, H+H)     Status: Abnormal   Collection Time: 09/02/22 10:15 AM  Result Value Ref Range   pH, Arterial 7.339 (L) 7.35 - 7.45    pCO2 arterial 44.0 32 - 48 mmHg   pO2, Arterial 314 (H) 83 - 108 mmHg   Bicarbonate 23.6 20.0 - 28.0 mmol/L   TCO2 25 22 - 32 mmol/L   O2 Saturation 100 %   Acid-base deficit 2.0 0.0 - 2.0 mmol/L   Sodium 139 135 - 145 mmol/L   Potassium 4.6 3.5 - 5.1 mmol/L   Calcium, Ion 1.05 (L) 1.15 - 1.40 mmol/L   HCT 28.0 (L) 36.0 - 46.0 %   Hemoglobin 9.5 (L) 12.0 - 15.0 g/dL   Sample type ARTERIAL   POCT I-Stat EG7     Status: Abnormal   Collection Time: 09/02/22 10:30 AM  Result Value Ref Range   pH, Ven 7.296 7.25 - 7.43   pCO2, Ven 49.8 44 - 60 mmHg   pO2, Ven 46 (H) 32 - 45 mmHg   Bicarbonate 24.2 20.0 - 28.0 mmol/L   TCO2 26 22 - 32 mmol/L   O2 Saturation 76 %   Acid-base deficit 2.0 0.0 - 2.0 mmol/L   Sodium 139 135 - 145 mmol/L   Potassium 5.0 3.5 - 5.1 mmol/L   Calcium, Ion 1.09 (L) 1.15 - 1.40 mmol/L   HCT 26.0 (L) 36.0 - 46.0 %   Hemoglobin 8.8 (L) 12.0 - 15.0 g/dL   Sample type VENOUS   Platelet count     Status: None   Collection Time: 09/02/22 10:41 AM  Result Value Ref Range   Platelets 187 150 - 400 K/uL    Comment: Performed at [facility], [address], [place], [place] [address]  Hemoglobin and hematocrit, blood     Status: Abnormal   Collection Time: 09/02/22 10:41 AM  Result Value Ref Range   Hemoglobin 8.5 (L) 12.0 - 15.0 g/dL    Comment: REPEATED TO VERIFY RESULTS CALLED TO AND READ BACK BY J.SIU RN [id number] MCCORMICK K    HCT 26.8 (L) 36.0 - 46.0 %    Comment: Performed at [facility], [address], [place], [place] [address]  I-STAT, [place] 8     Status: Abnormal   Collection Time: 09/02/22 10:45 AM  Result Value Ref Range   Sodium 139 135 - 145 mmol/L   Potassium 5.1 3.5 - 5.1 mmol/L   Chloride 105 98 - 111 mmol/L   BUN 4 (L) 6 - 20 mg/dL   Creatinine, Ser 5.64 (L) 0.44 - 1.00 mg/dL   Glucose, Bld 332 (H) 70 - 99 mg/dL    Comment: Glucose reference range applies only to samples taken after fasting for at least 8 hours.    Calcium, Ion 1.15 1.15 - 1.40 mmol/L   TCO2 23 22 - 32 mmol/L   Hemoglobin 8.8 (L) 12.0 - 15.0 g/dL   HCT 95.1 (  L) 36.0 - 46.0 %  I-STAT 7, (LYTES, BLD GAS, ICA, H+H)     Status: Abnormal   Collection Time: 09/02/22 11:23 AM  Result Value Ref Range   pH, Arterial 7.282 (L) 7.35 - 7.45   pCO2 arterial 45.3 32 - 48 mmHg   pO2, Arterial 154 (H) 83 - 108 mmHg   Bicarbonate 21.4 20.0 - 28.0 mmol/L   TCO2 23 22 - 32 mmol/L   O2 Saturation 99 %   Acid-base deficit 5.0 (H) 0.0 - 2.0 mmol/L   Sodium 141 135 - 145 mmol/L   Potassium 3.8 3.5 - 5.1 mmol/L   Calcium, Ion 1.16 1.15 - 1.40 mmol/L   HCT 27.0 (L) 36.0 - 46.0 %   Hemoglobin 9.2 (L) 12.0 - 15.0 g/dL   Sample type ARTERIAL   I-STAT, chem 8     Status: Abnormal   Collection Time: 09/02/22 11:26 AM  Result Value Ref Range   Sodium 141 135 - 145 mmol/L   Potassium 3.7 3.5 - 5.1 mmol/L   Chloride 105 98 - 111 mmol/L   BUN 4 (L) 6 - 20 mg/dL   Creatinine, Ser 3.01 0.44 - 1.00 mg/dL   Glucose, Bld 601 (H) 70 - 99 mg/dL    Comment: Glucose reference range applies only to samples taken after fasting for at least 8 hours.   Calcium, Ion 1.17 1.15 - 1.40 mmol/L   TCO2 25 22 - 32 mmol/L   Hemoglobin 10.2 (L) 12.0 - 15.0 g/dL   HCT 09.3 (L) 23.5 - 57.3 %  Glucose, capillary     Status: Abnormal   Collection Time: 09/02/22 12:42 PM  Result Value Ref Range   Glucose-Capillary 141 (H) 70 - 99 mg/dL    Comment: Glucose reference range applies only to samples taken after fasting for at least 8 hours.  I-STAT 7, (LYTES, BLD GAS, ICA, H+H)     Status: Abnormal   Collection Time: 09/02/22 12:45 PM  Result Value Ref Range   pH, Arterial 7.234 (L) 7.35 - 7.45   pCO2 arterial 54.7 (H) 32 - 48 mmHg   pO2, Arterial 64 (L) 83 - 108 mmHg   Bicarbonate 23.2 20.0 - 28.0 mmol/L   TCO2 25 22 - 32 mmol/L   O2 Saturation 88 %   Acid-base deficit 5.0 (H) 0.0 - 2.0 mmol/L   Sodium 142 135 - 145 mmol/L   Potassium 3.9 3.5 - 5.1 mmol/L   Calcium, Ion 1.21  1.15 - 1.40 mmol/L   HCT 38.0 36.0 - 46.0 %   Hemoglobin 12.9 12.0 - 15.0 g/dL   Patient temperature 22.0 C    Sample type ARTERIAL   Protime-INR     Status: Abnormal   Collection Time: 09/02/22 12:47 PM  Result Value Ref Range   Prothrombin Time 17.7 (H) 11.4 - 15.2 seconds   INR 1.4 (H) 0.8 - 1.2    Comment: (NOTE) INR goal varies based on device and disease states. Performed at [facility], [address], [place], [place] [address]   APTT     Status: None   Collection Time: 09/02/22 12:47 PM  Result Value Ref Range   aPTT 29 24 - 36 seconds    Comment: Performed at [facility], [address] [place], [place] [address]  Glucose, capillary     Status: Abnormal   Collection Time: 09/02/22  1:38 PM  Result Value Ref Range   Glucose-Capillary 109 (H) 70 - 99  mg/dL    Comment: Glucose reference range applies only to samples taken after fasting for at least 8 hours.  CBC     Status: Abnormal   Collection Time: 09/02/22  1:39 PM  Result Value Ref Range   WBC 23.5 (H) 4.0 - 10.5 K/uL   RBC 4.32 3.87 - 5.11 MIL/uL   Hemoglobin 12.5 12.0 - 15.0 g/dL   HCT 08.6 57.8 - 46.9 %   MCV 91.4 80.0 - 100.0 fL   MCH 28.9 26.0 - 34.0 pg   MCHC 31.6 30.0 - 36.0 g/dL   RDW 62.9 52.8 - 41.3 %   Platelets 238 150 - 400 K/uL   nRBC 0.0 0.0 - 0.2 %    Comment: Performed at [facility], [address] [place], [place] [address]  Glucose, capillary     Status: Abnormal   Collection Time: 09/02/22  2:21 PM  Result Value Ref Range   Glucose-Capillary 137 (H) 70 - 99 mg/dL    Comment: Glucose reference range applies only to samples taken after fasting for at least 8 hours.  Glucose, capillary     Status: Abnormal   Collection Time: 09/02/22  3:05 PM  Result Value Ref Range   Glucose-Capillary 120 (H) 70 - 99 mg/dL    Comment: Glucose reference range applies only to samples taken after fasting for at least 8 hours.  Glucose, capillary     Status: Abnormal   Collection  Time: 09/02/22  4:03 PM  Result Value Ref Range   Glucose-Capillary 139 (H) 70 - 99 mg/dL    Comment: Glucose reference range applies only to samples taken after fasting for at least 8 hours.  I-STAT 7, (LYTES, BLD GAS, ICA, H+H)     Status: Abnormal   Collection Time: 09/02/22  4:04 PM  Result Value Ref Range   pH, Arterial 7.361 7.35 - 7.45   pCO2 arterial 39.7 32 - 48 mmHg   pO2, Arterial 92 83 - 108 mmHg   Bicarbonate 22.4 20.0 - 28.0 mmol/L   TCO2 24 22 - 32 mmol/L   O2 Saturation 97 %   Acid-base deficit 3.0 (H) 0.0 - 2.0 mmol/L   Sodium 140 135 - 145 mmol/L   Potassium 4.5 3.5 - 5.1 mmol/L   Calcium, Ion 1.19 1.15 - 1.40 mmol/L   HCT 38.0 36.0 - 46.0 %   Hemoglobin 12.9 12.0 - 15.0 g/dL   Patient temperature 02.7 C    Sample type ARTERIAL   Glucose, capillary     Status: Abnormal   Collection Time: 09/02/22  5:01 PM  Result Value Ref Range   Glucose-Capillary 101 (H) 70 - 99 mg/dL    Comment: Glucose reference range applies only to samples taken after fasting for at least 8 hours.  I-STAT 7, (LYTES, BLD GAS, ICA, H+H)     Status: Abnormal   Collection Time: 09/02/22  5:01 PM  Result Value Ref Range   pH, Arterial 7.364 7.35 - 7.45   pCO2 arterial 34.4 32 - 48 mmHg   pO2, Arterial 82 (L) 83 - 108 mmHg   Bicarbonate 19.5 (L) 20.0 - 28.0 mmol/L   TCO2 21 (L) 22 - 32 mmol/L   O2 Saturation 96 %   Acid-base deficit 5.0 (H) 0.0 - 2.0 mmol/L   Sodium 140 135 - 145 mmol/L   Potassium 3.8 3.5 - 5.1 mmol/L   Calcium, Ion 1.16 1.15 - 1.40 mmol/L   HCT 36.0 36.0 - 46.0 %  Hemoglobin 12.2 12.0 - 15.0 g/dL   Patient temperature 16.1 C    Sample type ARTERIAL   Glucose, capillary     Status: Abnormal   Collection Time: 09/02/22  6:10 PM  Result Value Ref Range   Glucose-Capillary 142 (H) 70 - 99 mg/dL    Comment: Glucose reference range applies only to samples taken after fasting for at least 8 hours.  Basic metabolic panel     Status: Abnormal   Collection Time: 09/02/22   6:11 PM  Result Value Ref Range   Sodium 139 135 - 145 mmol/L   Potassium 4.3 3.5 - 5.1 mmol/L   Chloride 109 98 - 111 mmol/L   CO2 22 22 - 32 mmol/L   Glucose, Bld 147 (H) 70 - 99 mg/dL    Comment: Glucose reference range applies only to samples taken after fasting for at least 8 hours.   BUN 6 6 - 20 mg/dL   Creatinine, Ser 0.96 0.44 - 1.00 mg/dL   Calcium 8.1 (L) 8.9 - 10.3 mg/dL   GFR, Estimated >04 >54 mL/min    Comment: (NOTE) Calculated using the CKD-EPI Creatinine Equation (2021)    Anion gap 8 5 - 15    Comment: Performed at [facility], [address], [place], [place] [address]  CBC     Status: Abnormal   Collection Time: 09/02/22  6:11 PM  Result Value Ref Range   WBC 25.5 (H) 4.0 - 10.5 K/uL   RBC 4.33 3.87 - 5.11 MIL/uL   Hemoglobin 12.7 12.0 - 15.0 g/dL   HCT 91.4 78.2 - 95.6 %   MCV 91.2 80.0 - 100.0 fL   MCH 29.3 26.0 - 34.0 pg   MCHC 32.2 30.0 - 36.0 g/dL   RDW 21.3 08.6 - 57.8 %   Platelets 217 150 - 400 K/uL   nRBC 0.0 0.0 - 0.2 %    Comment: Performed at [facility], [address], [place], [place] [address]  Magnesium     Status: Abnormal   Collection Time: 09/02/22  6:11 PM  Result Value Ref Range   Magnesium 2.6 (H) 1.7 - 2.4 mg/dL    Comment: Performed at [facility], [address] [place], [place] [address]  Glucose, capillary     Status: Abnormal   Collection Time: 09/02/22  7:28 PM  Result Value Ref Range   Glucose-Capillary 171 (H) 70 - 99 mg/dL    Comment: Glucose reference range applies only to samples taken after fasting for at least 8 hours.  Glucose, capillary     Status: Abnormal   Collection Time: 09/02/22  8:00 PM  Result Value Ref Range   Glucose-Capillary 156 (H) 70 - 99 mg/dL    Comment: Glucose reference range applies only to samples taken after fasting for at least 8 hours.  Glucose, capillary     Status: Abnormal   Collection Time: 09/02/22  8:59 PM  Result Value Ref Range   Glucose-Capillary 159 (H)  70 - 99 mg/dL    Comment: Glucose reference range applies only to samples taken after fasting for at least 8 hours.  Glucose, capillary     Status: Abnormal   Collection Time: 09/02/22 10:00 PM  Result Value Ref Range   Glucose-Capillary 141 (H) 70 - 99 mg/dL    Comment: Glucose reference range applies only to samples taken after fasting for at least 8 hours.  Glucose, capillary     Status: Abnormal  Collection Time: 09/02/22 10:56 PM  Result Value Ref Range   Glucose-Capillary 137 (H) 70 - 99 mg/dL    Comment: Glucose reference range applies only to samples taken after fasting for at least 8 hours.  Glucose, capillary     Status: Abnormal   Collection Time: 09/02/22 11:56 PM  Result Value Ref Range   Glucose-Capillary 143 (H) 70 - 99 mg/dL    Comment: Glucose reference range applies only to samples taken after fasting for at least 8 hours.  Glucose, capillary     Status: Abnormal   Collection Time: 09/03/22  1:02 AM  Result Value Ref Range   Glucose-Capillary 147 (H) 70 - 99 mg/dL    Comment: Glucose reference range applies only to samples taken after fasting for at least 8 hours.  Glucose, capillary     Status: Abnormal   Collection Time: 09/03/22  1:58 AM  Result Value Ref Range   Glucose-Capillary 123 (H) 70 - 99 mg/dL    Comment: Glucose reference range applies only to samples taken after fasting for at least 8 hours.  Glucose, capillary     Status: Abnormal   Collection Time: 09/03/22  2:59 AM  Result Value Ref Range   Glucose-Capillary 127 (H) 70 - 99 mg/dL    Comment: Glucose reference range applies only to samples taken after fasting for at least 8 hours.  Glucose, capillary     Status: Abnormal   Collection Time: 09/03/22  3:58 AM  Result Value Ref Range   Glucose-Capillary 117 (H) 70 - 99 mg/dL    Comment: Glucose reference range applies only to samples taken after fasting for at least 8 hours.  CBC     Status: Abnormal   Collection Time: 09/03/22  5:00 AM  Result  Value Ref Range   WBC 13.4 (H) 4.0 - 10.5 K/uL   RBC 3.78 (L) 3.87 - 5.11 MIL/uL   Hemoglobin 11.1 (L) 12.0 - 15.0 g/dL   HCT 29.5 (L) 62.1 - 30.8 %   MCV 92.6 80.0 - 100.0 fL   MCH 29.4 26.0 - 34.0 pg   MCHC 31.7 30.0 - 36.0 g/dL   RDW 65.7 84.6 - 96.2 %   Platelets 135 (L) 150 - 400 K/uL    Comment: REPEATED TO VERIFY   nRBC 0.0 0.0 - 0.2 %    Comment: Performed at [facility], [address] [place], [place] [address]  Basic metabolic panel     Status: Abnormal   Collection Time: 09/03/22  5:00 AM  Result Value Ref Range   Sodium 134 (L) 135 - 145 mmol/L   Potassium 4.2 3.5 - 5.1 mmol/L   Chloride 102 98 - 111 mmol/L   CO2 23 22 - 32 mmol/L   Glucose, Bld 119 (H) 70 - 99 mg/dL    Comment: Glucose reference range applies only to samples taken after fasting for at least 8 hours.   BUN 7 6 - 20 mg/dL   Creatinine, Ser 1.32 0.44 - 1.00 mg/dL   Calcium 8.3 (L) 8.9 - 10.3 mg/dL   GFR, Estimated >44 >01 mL/min    Comment: (NOTE) Calculated using the CKD-EPI Creatinine Equation (2021)    Anion gap 9 5 - 15    Comment: Performed at [facility], [address], [place], [place] [address]  Magnesium     Status: None   Collection Time: 09/03/22  5:00 AM  Result Value Ref Range   Magnesium 2.1 1.7 -  2.4 mg/dL    Comment: Performed at [facility], [address] [place], [place] [address]  Glucose, capillary     Status: Abnormal   Collection Time: 09/03/22  5:55 AM  Result Value Ref Range   Glucose-Capillary 113 (H) 70 - 99 mg/dL    Comment: Glucose reference range applies only to samples taken after fasting for at least 8 hours.  Glucose, capillary     Status: None   Collection Time: 09/03/22  8:25 AM  Result Value Ref Range   Glucose-Capillary 98 70 - 99 mg/dL    Comment: Glucose reference range applies only to samples taken after fasting for at least 8 hours.  Glucose, capillary     Status: Abnormal   Collection Time: 09/03/22 12:06 PM  Result Value Ref  Range   Glucose-Capillary 32 (LL) 70 - 99 mg/dL    Comment: Glucose reference range applies only to samples taken after fasting for at least 8 hours.   Comment 1 Repeat Test   Glucose, capillary     Status: None   Collection Time: 09/03/22 12:09 PM  Result Value Ref Range   Glucose-Capillary 84 70 - 99 mg/dL    Comment: Glucose reference range applies only to samples taken after fasting for at least 8 hours.  Glucose, capillary     Status: None   Collection Time: 09/03/22  4:35 PM  Result Value Ref Range   Glucose-Capillary 92 70 - 99 mg/dL    Comment: Glucose reference range applies only to samples taken after fasting for at least 8 hours.  Basic metabolic panel     Status: None   Collection Time: 09/03/22  5:00 PM  Result Value Ref Range   Sodium 136 135 - 145 mmol/L   Potassium 4.3 3.5 - 5.1 mmol/L   Chloride 99 98 - 111 mmol/L   CO2 25 22 - 32 mmol/L   Glucose, Bld 95 70 - 99 mg/dL    Comment: Glucose reference range applies only to samples taken after fasting for at least 8 hours.   BUN 8 6 - 20 mg/dL   Creatinine, Ser 3.47 0.44 - 1.00 mg/dL   Calcium 8.9 8.9 - 42.5 mg/dL   GFR, Estimated >95 >63 mL/min    Comment: (NOTE) Calculated using the CKD-EPI Creatinine Equation (2021)    Anion gap 12 5 - 15    Comment: Performed at [facility] [facility], [address], [place], [place] [address]  Magnesium     Status: None   Collection Time: 09/03/22  5:00 PM  Result Value Ref Range   Magnesium 1.9 1.7 - 2.4 mg/dL    Comment: Performed at [facility], [address] [place], [place] [address]  CBC     Status: Abnormal   Collection Time: 09/03/22  5:00 PM  Result Value Ref Range   WBC 11.5 (H) 4.0 - 10.5 K/uL   RBC 3.44 (L) 3.87 - 5.11 MIL/uL   Hemoglobin 10.0 (L) 12.0 - 15.0 g/dL   HCT 18.8 (L) 41.6 - 60.6 %   MCV 94.2 80.0 - 100.0 fL   MCH 29.1 26.0 - 34.0 pg   MCHC 30.9 30.0 - 36.0 g/dL   RDW 30.1 60.1 - 09.3 %   Platelets 125 (L) 150 - 400 K/uL   nRBC 0.0 0.0 -  0.2 %    Comment: Performed at [facility], [address] [place], [place] [address]  Glucose, capillary  Status: None   Collection Time: 09/03/22  7:51 PM  Result Value Ref Range   Glucose-Capillary 88 70 - 99 mg/dL    Comment: Glucose reference range applies only to samples taken after fasting for at least 8 hours.  Glucose, capillary     Status: None   Collection Time: 09/04/22  1:05 AM  Result Value Ref Range   Glucose-Capillary 93 70 - 99 mg/dL    Comment: Glucose reference range applies only to samples taken after fasting for at least 8 hours.  Glucose, capillary     Status: None   Collection Time: 09/04/22  3:47 AM  Result Value Ref Range   Glucose-Capillary 90 70 - 99 mg/dL    Comment: Glucose reference range applies only to samples taken after fasting for at least 8 hours.  Basic metabolic panel     Status: Abnormal   Collection Time: 09/04/22  5:34 AM  Result Value Ref Range   Sodium 133 (L) 135 - 145 mmol/L   Potassium 4.5 3.5 - 5.1 mmol/L   Chloride 99 98 - 111 mmol/L   CO2 25 22 - 32 mmol/L   Glucose, Bld 98 70 - 99 mg/dL    Comment: Glucose reference range applies only to samples taken after fasting for at least 8 hours.   BUN 7 6 - 20 mg/dL   Creatinine, Ser 8.46 0.44 - 1.00 mg/dL   Calcium 8.5 (L) 8.9 - 10.3 mg/dL   GFR, Estimated >96 >29 mL/min    Comment: (NOTE) Calculated using the CKD-EPI Creatinine Equation (2021)    Anion gap 9 5 - 15    Comment: Performed at [facility], [address], [place], [place] [address]  CBC     Status: Abnormal   Collection Time: 09/04/22  5:34 AM  Result Value Ref Range   WBC 10.1 4.0 - 10.5 K/uL   RBC 3.52 (L) 3.87 - 5.11 MIL/uL   Hemoglobin 10.2 (L) 12.0 - 15.0 g/dL   HCT 32.4 (L) 40.1 - 02.7 %   MCV 93.5 80.0 - 100.0 fL   MCH 29.0 26.0 - 34.0 pg   MCHC 31.0 30.0 - 36.0 g/dL   RDW 25.3 66.4 - 40.3 %   Platelets 115 (L) 150 - 400 K/uL    Comment: REPEATED TO VERIFY   nRBC 0.0 0.0 - 0.2 %     Comment: Performed at [facility], [address], [place], [place] [address]  Glucose, capillary     Status: None   Collection Time: 09/04/22  7:36 AM  Result Value Ref Range   Glucose-Capillary 92 70 - 99 mg/dL    Comment: Glucose reference range applies only to samples taken after fasting for at least 8 hours.  Glucose, capillary     Status: None   Collection Time: 09/04/22 11:22 AM  Result Value Ref Range   Glucose-Capillary 75 70 - 99 mg/dL    Comment: Glucose reference range applies only to samples taken after fasting for at least 8 hours.  Glucose, capillary     Status: None   Collection Time: 09/04/22  4:00 PM  Result Value Ref Range   Glucose-Capillary 89 70 - 99 mg/dL    Comment: Glucose reference range applies only to samples taken after fasting for at least 8 hours.  Glucose, capillary     Status: None   Collection Time: 09/04/22  7:25 PM  Result Value Ref Range   Glucose-Capillary 88 70 - 99  mg/dL    Comment: Glucose reference range applies only to samples taken after fasting for at least 8 hours.  CBC     Status: Abnormal   Collection Time: 09/05/22  5:05 AM  Result Value Ref Range   WBC 7.7 4.0 - 10.5 K/uL   RBC 3.35 (L) 3.87 - 5.11 MIL/uL   Hemoglobin 9.8 (L) 12.0 - 15.0 g/dL   HCT 16.1 (L) 09.6 - 04.5 %   MCV 92.5 80.0 - 100.0 fL   MCH 29.3 26.0 - 34.0 pg   MCHC 31.6 30.0 - 36.0 g/dL   RDW 40.9 81.1 - 91.4 %   Platelets 131 (L) 150 - 400 K/uL    Comment: REPEATED TO VERIFY   nRBC 0.0 0.0 - 0.2 %    Comment: Performed at [facility], [address] [place], [place] [address]  Basic metabolic panel     Status: Abnormal   Collection Time: 09/05/22  5:05 AM  Result Value Ref Range   Sodium 137 135 - 145 mmol/L   Potassium 3.9 3.5 - 5.1 mmol/L   Chloride 100 98 - 111 mmol/L   CO2 27 22 - 32 mmol/L   Glucose, Bld 81 70 - 99 mg/dL    Comment: Glucose reference range applies only to samples taken after fasting for at least 8 hours.   BUN 5 (L) 6 -  20 mg/dL   Creatinine, Ser 6.21 0.44 - 1.00 mg/dL   Calcium 8.3 (L) 8.9 - 10.3 mg/dL   GFR, Estimated >30 >86 mL/min    Comment: (NOTE) Calculated using the CKD-EPI Creatinine Equation (2021)    Anion gap 10 5 - 15    Comment: Performed at [facility], [address] [place], [place] [address]  CBC     Status: Abnormal   Collection Time: 09/06/22  3:41 AM  Result Value Ref Range   WBC 7.7 4.0 - 10.5 K/uL   RBC 3.54 (L) 3.87 - 5.11 MIL/uL   Hemoglobin 10.6 (L) 12.0 - 15.0 g/dL   HCT 96.2 (L) 95.2 - 84.1 %   MCV 92.7 80.0 - 100.0 fL   MCH 29.9 26.0 - 34.0 pg   MCHC 32.3 30.0 - 36.0 g/dL   RDW 32.4 40.1 - 02.7 %   Platelets 191 150 - 400 K/uL   nRBC 0.0 0.0 - 0.2 %    Comment: Performed at [facility], [address], [place], [place] [address]  Basic metabolic panel     Status: Abnormal   Collection Time: 09/06/22  3:41 AM  Result Value Ref Range   Sodium 135 135 - 145 mmol/L   Potassium 3.4 (L) 3.5 - 5.1 mmol/L   Chloride 98 98 - 111 mmol/L   CO2 26 22 - 32 mmol/L   Glucose, Bld 93 70 - 99 mg/dL    Comment: Glucose reference range applies only to samples taken after fasting for at least 8 hours.   BUN 5 (L) 6 - 20 mg/dL   Creatinine, Ser 4.40 0.44 - 1.00 mg/dL   Calcium 8.1 (L) 8.9 - 10.3 mg/dL   GFR, Estimated >34 >74 mL/min    Comment: (NOTE) Calculated using the CKD-EPI Creatinine Equation (2021)    Anion gap 11 5 - 15    Comment: Performed at [facility], [address] [place], [place] [address]  CBC     Status: Abnormal   Collection Time: 09/07/22  2:37 AM  Result  Value Ref Range   WBC 7.7 4.0 - 10.5 K/uL   RBC 4.00 3.87 - 5.11 MIL/uL   Hemoglobin 11.7 (L) 12.0 - 15.0 g/dL   HCT 34.7 (L) 42.5 - 95.6 %   MCV 89.5 80.0 - 100.0 fL   MCH 29.3 26.0 - 34.0 pg   MCHC 32.7 30.0 - 36.0 g/dL   RDW 38.7 56.4 - 33.2 %   Platelets 216 150 - 400 K/uL   nRBC 0.0 0.0 - 0.2 %    Comment: Performed at [facility], [address]  [place], [place] [address]  Basic metabolic panel     Status: Abnormal   Collection Time: 09/07/22  2:37 AM  Result Value Ref Range   Sodium 136 135 - 145 mmol/L   Potassium 3.9 3.5 - 5.1 mmol/L   Chloride 96 (L) 98 - 111 mmol/L   CO2 28 22 - 32 mmol/L   Glucose, Bld 94 70 - 99 mg/dL    Comment: Glucose reference range applies only to samples taken after fasting for at least 8 hours.   BUN 5 (L) 6 - 20 mg/dL   Creatinine, Ser 4.16 0.44 - 1.00 mg/dL   Calcium 8.7 (L) 8.9 - 10.3 mg/dL   GFR, Estimated >60 >63 mL/min    Comment: (NOTE) Calculated using the CKD-EPI Creatinine Equation (2021)    Anion gap 12 5 - 15    Comment: Performed at [facility], [address] [place], [place] [address]  Basic metabolic panel     Status: Abnormal   Collection Time: 09/30/22  9:18 PM  Result Value Ref Range   Sodium 134 (L) 135 - 145 mmol/L   Potassium 3.8 3.5 - 5.1 mmol/L   Chloride 103 98 - 111 mmol/L   CO2 18 (L) 22 - 32 mmol/L   Glucose, Bld 97 70 - 99 mg/dL    Comment: Glucose reference range applies only to samples taken after fasting for at least 8 hours.   BUN 7 6 - 20 mg/dL   Creatinine, Ser 0.93 0.44 - 1.00 mg/dL   Calcium 9.4 8.9 - 23.5 mg/dL   GFR, Estimated >57 >32 mL/min    Comment: (NOTE) Calculated using the CKD-EPI Creatinine Equation (2021)    Anion gap 13 5 - 15    Comment: Performed at [facility], [address] [place], [place] [address]  CBC     Status: None   Collection Time: 09/30/22  9:18 PM  Result Value Ref Range   WBC 10.4 4.0 - 10.5 K/uL   RBC 4.97 3.87 - 5.11 MIL/uL   Hemoglobin 14.0 12.0 - 15.0 g/dL   HCT 27.0 62.3 - 76.2 %   MCV 88.3 80.0 - 100.0 fL   MCH 28.2 26.0 - 34.0 pg   MCHC 31.9 30.0 - 36.0 g/dL   RDW 83.1 51.7 - 61.6 %   Platelets 343 150 - 400 K/uL   nRBC 0.0 0.0 - 0.2 %    Comment: Performed at [facility], [address], [place], [place] [address]  Troponin I (High Sensitivity)     Status: None   Collection  Time: 09/30/22  9:18 PM  Result Value Ref Range   Troponin I (High Sensitivity) 11 <18 ng/L    Comment: (NOTE) Elevated high sensitivity troponin I (hsTnI) values and significant  changes across serial measurements may suggest ACS but many other  chronic and acute conditions are known to elevate hsTnI results.  Refer to the "Links" section for chest pain algorithms and additional  guidance. Performed at [facility], [address] [place], [place] [address]   Troponin I (High Sensitivity)     Status: None   Collection Time: 10/01/22 12:36 AM  Result Value Ref Range   Troponin I (High Sensitivity) 10 <18 ng/L    Comment: (NOTE) Elevated high sensitivity troponin I (hsTnI) values and significant  changes across serial measurements may suggest ACS but many other  chronic and acute conditions are known to elevate hsTnI results.  Refer to the "Links" section for chest pain algorithms and additional  guidance. Performed at [facility], [address] [place], [place] [address]   Basic metabolic panel     Status: Abnormal   Collection Time: 10/20/22  9:48 PM  Result Value Ref Range   Sodium 135 135 - 145 mmol/L   Potassium 3.3 (L) 3.5 - 5.1 mmol/L   Chloride 107 98 - 111 mmol/L   CO2 18 (L) 22 - 32 mmol/L   Glucose, Bld 136 (H) 70 - 99 mg/dL    Comment: Glucose reference range applies only to samples taken after fasting for at least 8 hours.   BUN 6 6 - 20 mg/dL   Creatinine, Ser 0.98 0.44 - 1.00 mg/dL   Calcium 9.2 8.9 - 11.9 mg/dL   GFR, Estimated >14 >78 mL/min    Comment: (NOTE) Calculated using the CKD-EPI Creatinine Equation (2021)    Anion gap 10 5 - 15    Comment: Performed at [facility], [address] [place], [place] [address]  CBC     Status: Abnormal   Collection Time: 10/20/22  9:48 PM  Result Value Ref Range   WBC 10.8 (H) 4.0 - 10.5 K/uL   RBC 5.41 (H) 3.87 - 5.11 MIL/uL   Hemoglobin 15.0 12.0 - 15.0 g/dL   HCT 13.0 (H)  86.5 - 46.0 %   MCV 86.0 80.0 - 100.0 fL   MCH 27.7 26.0 - 34.0 pg   MCHC 32.3 30.0 - 36.0 g/dL   RDW 78.4 69.6 - 29.5 %   Platelets 329 150 - 400 K/uL   nRBC 0.0 0.0 - 0.2 %    Comment: Performed at [facility], [address], [place], [place] [address]  Troponin I (High Sensitivity)     Status: None   Collection Time: 10/20/22  9:48 PM  Result Value Ref Range   Troponin I (High Sensitivity) 11 <18 ng/L    Comment: (NOTE) Elevated high sensitivity troponin I (hsTnI) values and significant  changes across serial measurements may suggest ACS but many other  chronic and acute conditions are known to elevate hsTnI results.  Refer to the "Links" section for chest pain algorithms and additional  guidance. Performed at [facility], [address] [place], [place] [address]   Troponin I (High Sensitivity)     Status: None   Collection Time: 10/20/22 11:54 PM  Result Value Ref Range   Troponin I (High Sensitivity) 12 <18 ng/L    Comment: (NOTE) Elevated high sensitivity troponin I (hsTnI) values and significant  changes across serial measurements may suggest ACS but many other  chronic and acute conditions are known to elevate hsTnI results.  Refer to the "Links" section for chest pain algorithms and additional  guidance. Performed at [facility], [address] [place], [place] [address]        Assessment & Plan:   Problem  List Items Addressed This Visit       Active Problems   Anxiety   Obstructive sleep apnea - Primary    referring pt. for sleep study.  Will await results.       Relevant Orders   Ambulatory referral to Sleep Studies   Calculus of gallbladder with acute on chronic cholecystitis without obstruction    setting up referral to GI for pt.  Will defer to them for further treatment.       Relevant Orders   Ambulatory referral to Gastroenterology   Insomnia    Sending meds for her to try taking for sleep.          Return in about 2 weeks (around 10/28/2022).   Total time spent: 20 minutes  Miki Kins, FNP  10/14/2022   This document may have been prepared by [facility] Voice Recognition software and as such may include unintentional dictation errors.

## 2022-10-24 NOTE — Assessment & Plan Note (Signed)
Sending meds for her to try taking for sleep.

## 2022-10-29 ENCOUNTER — Ambulatory Visit: Payer: MEDICAID | Admitting: Family

## 2022-10-29 VITALS — BP 130/75 | HR 126 | Ht 67.0 in | Wt 220.8 lb

## 2022-10-29 DIAGNOSIS — K219 Gastro-esophageal reflux disease without esophagitis: Secondary | ICD-10-CM

## 2022-10-29 DIAGNOSIS — F5104 Psychophysiologic insomnia: Secondary | ICD-10-CM

## 2022-10-29 DIAGNOSIS — F319 Bipolar disorder, unspecified: Secondary | ICD-10-CM

## 2022-10-29 DIAGNOSIS — K8012 Calculus of gallbladder with acute and chronic cholecystitis without obstruction: Secondary | ICD-10-CM

## 2022-11-03 ENCOUNTER — Ambulatory Visit: Payer: Medicaid Other | Admitting: Surgery

## 2022-11-15 ENCOUNTER — Other Ambulatory Visit: Payer: Self-pay | Admitting: Family

## 2022-11-15 ENCOUNTER — Telehealth: Payer: Self-pay | Admitting: Family

## 2022-11-15 MED ORDER — HYDROCODONE-ACETAMINOPHEN 5-325 MG PO TABS: 1.0000 | ORAL_TABLET | Freq: Four times a day (QID) | ORAL | 0 refills | Status: DC | PRN

## 2022-11-15 MED ORDER — METRONIDAZOLE 500 MG PO TABS: 500.0000 mg | ORAL_TABLET | Freq: Three times a day (TID) | ORAL | 0 refills | Status: AC

## 2022-11-15 NOTE — Telephone Encounter (Signed)
Patient left VM that she has an infected tooth and she can't get any dentist to see her until she gets cleared from cardiology to stop her blood thinner for 5 days. States she also has a cough and runny nose now. Can we send her something in?

## 2022-11-15 NOTE — Telephone Encounter (Signed)
Patient called again and is very upset. States she is in a lot of pain from her infected tooth and would like something for pain sent in today please. Needs this done ASAP please.  [facility] [employer]

## 2022-11-15 NOTE — Telephone Encounter (Signed)
Patient called back again and Marchelle Folks is sending somethng in for the patient

## 2022-11-15 NOTE — Progress Notes (Signed)
Pt called, has infected tooth.  Asked for antibiotics, pain control.  Is allergic to codeine, so sending low dose norco 5 d supply.

## 2022-11-16 NOTE — Progress Notes (Unsigned)
Established Patient Office Visit  Subjective:  Patient ID: Elizabeth Spencer, female    DOB: 15-Apr-1985  Age: 37 y.o. MRN: [id number]  Chief Complaint  Patient presents with  . Follow-up    2 week F/U    Patient is here for her 2 week follow up. She has not been feeling any better, says that her sleep has still been bad, though it did improve with the change in her medications.   She has been waiting to see a dentist about her infected tooth, says that it is okay right now, but is worried about how long it will take her to get in to see them.   Still needs referral to GI - has been having significant problems with her gallbladder still since the discharge from the hospital.    No other concerns at this time.   Past Medical History:  Diagnosis Date  . Aneurysm (HCC) 01/04/2014  . Arthritis   . Asthma    WELL CONTROLLED  . Brain aneurysm 2007   NEUROLOGY NOTE DOES NOT MENTION ANEURYSM BUT PT STATES SHE DID NOT HAVE TO HAVE SURGERY  . Complication of anesthesia    FOR 1 C-SECTION PT WAS ITCHING AND VERY RED ON HER FACE  . Cough, persistent 01/27/2016  . Dermatitis due to sunburn 11/08/2013  . Family history of adverse reaction to anesthesia    brother, neice and nephew got red in face with hives  . Gallstones   . GERD (gastroesophageal reflux disease)   . Gonorrhea 06/20/2020  . Headache    CHRONIC HEADACHES  . History of chronic cough    DRY  . Loss of memory 05/30/2017  . Perforation of left tympanic membrane 05/17/2013  . Pneumonia   . PTSD (post-traumatic stress disorder)   . PTSD (post-traumatic stress disorder)   . Thyroid condition    PT WAS JUST TOLD ON 08-25-17 THAT SHE HAS A THYROID PROBLEM AND IS GOING TO F/U WITH ENDOCRINOLOGIST IN 2 WEEKS  . 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 ABDOMINAL HYSTERECTOMY    . CESAREAN SECTION     x3  . CORONARY ARTERY BYPASS GRAFT N/A 09/02/2022   Procedure:  CORONARY ARTERY BYPASS GRAFTING (CABG) X 1 USING LEFT INTERNAL MAMMARY ARTERY;  Surgeon: Alleen Borne, MD;  [facility];  Service: Open Heart Surgery;  Laterality: N/A;  . CYSTOSCOPY N/A 09/29/2017   Procedure: CYSTOSCOPY;  Surgeon: Vena Austria, MD;  Location: [facility] ORS;  Service: Gynecology;  Laterality: N/A;  . CYSTOSCOPY N/A 11/05/2017   Procedure: CYSTOSCOPY;  Surgeon: Vena Austria, MD;  Location: [facility] ORS;  Service: Gynecology;  Laterality: N/A;  . HYSTEROSCOPY WITH D & C N/A 09/01/2017   Procedure: DILATATION AND CURETTAGE /HYSTEROSCOPY;  Surgeon: Vena Austria, MD;  Location: [facility] ORS;  Service: Gynecology;  Laterality: N/A;  . LAPAROSCOPY N/A 09/01/2017   Procedure: LAPAROSCOPY OPERATIVE with biopsy;  Surgeon: Vena Austria, MD;  Location: [facility] ORS;  Service: Gynecology;  Laterality: N/A;  . LAPAROSCOPY N/A 11/05/2017   Procedure: LAPAROSCOPY DIAGNOSTIC;  Surgeon: Vena Austria, MD;  Location: [facility] OR[facility];  Service: Gynecology;  Laterality: N/A;  . LEFT HEART CATH AND CORONARY ANGIOGRAPHY N/A 09/01/2022   Procedure: LEFT HEART CATH AND CORONARY ANGIOGRAPHY;  Surgeon: Yvonne Kendall, MD;  Location: [facility] INVASIVE [facility];  Service: Cardiovascular;  Laterality: N/A;  . REPAIR VAGINAL CUFF N/A 11/05/2017   Procedure: REPAIR VAGINAL CUFF;  Surgeon: Vena Austria,  MD;  Location: [facility] OR[facility];  Service: Gynecology;  Laterality: N/A;  . TEE WITHOUT CARDIOVERSION N/A 09/02/2022   Procedure: TRANSESOPHAGEAL ECHOCARDIOGRAM;  Surgeon: Alleen Borne, MD;  Location: [facility];  Service: Open Heart Surgery;  Laterality: N/A;  . TOTAL LAPAROSCOPIC HYSTERECTOMY WITH SALPINGECTOMY Bilateral 09/29/2017   Procedure: TOTAL LAPAROSCOPIC HYSTERECTOMY WITH SALPINGECTOMY;  Surgeon: Vena Austria, MD;  Location: [facility] OR[facility];  Service: Gynecology;  Laterality: Bilateral;    Social History   Socioeconomic History  . Marital status: Single    Spouse name: Not on file  . Number of children: Not on  file  . Years of education: Not on file  . Highest education level: Not on file  Occupational History  . Not on file  Tobacco Use  . Smoking status: Every Day    Current packs/day: 0.50    Average packs/day: 0.5 packs/day for 18.0 years (9.0 ttl pk-yrs)    Types: Cigarettes  . Smokeless tobacco: Never  Vaping Use  . Vaping status: Never Used  Substance and Sexual Activity  . Alcohol use: Yes    Alcohol/week: 0.0 standard drinks of alcohol    Comment: occasionally  . Drug use: Not Currently    Types: Marijuana, Cocaine  . Sexual activity: Yes    Partners: Male    Birth control/protection: Surgical    Comment: Hysterectomy  Other Topics Concern  . Not on file  Social History Narrative  . Not on file   Social Determinants of Health   Financial Resource Strain: Not on file  Food Insecurity: No Food Insecurity (09/01/2022)   Hunger Vital Sign   . Worried About Programme researcher, broadcasting/film/video in the Last Year: Never true   . Ran Out of Food in the Last Year: Never true  Transportation Needs: No Transportation Needs (09/01/2022)   PRAPARE - Transportation   . Lack of Transportation (Medical): No   . Lack of Transportation (Non-Medical): No  Physical Activity: Not on file  Stress: Not on file  Social Connections: Not on file  Intimate Partner Violence: Not At Risk (09/01/2022)   Humiliation, Afraid, Rape, and Kick questionnaire   . Fear of Current or Ex-Partner: No   . Emotionally Abused: No   . Physically Abused: No   . Sexually Abused: No    Family History  Problem Relation Age of Onset  . Hypertension Mother   . Alcohol abuse Mother   . Deep vein thrombosis Mother   . Alcohol abuse Father   . COPD Father   . Emphysema Father   . Heart disease Father     Allergies  Allergen Reactions  . Azithromycin Swelling and Other (See Comments)  . Clindamycin Swelling  . Codeine Hives and Other (See Comments)    Nausea/dizzy Nausea/dizzy  . Meloxicam Other (See Comments)    Burns  up from the inside   . Neurontin [Gabapentin] Other (See Comments)    Makes her pass out and not remember what happened prior to taking med.  Marland Kitchen Penicillins Anaphylaxis    Patient allergic to all "cillins" Has patient had a PCN reaction causing immediate rash, facial/tongue/throat swelling, SOB or lightheadedness with hypotension: Yes Has patient had a PCN reaction causing severe rash involving mucus membranes or skin necrosis: Yes Has patient had a PCN reaction that required hospitalization: No Has patient had a PCN reaction occurring within the last 10 years: Yes If all of the above answers are "NO", then may proceed with Cephalosporin use.   . Clindamycin/Lincomycin  Swelling  . Other Hives and Other (See Comments)    Berry flavored food and drinks  . Diphenhydramine Hcl Rash and Other (See Comments)  . Doxycycline Rash  . Egg-Derived Products Rash    ROS     Objective:   BP 130/75   Pulse (!) 126   Ht 5\' 7"  (1.702 m)   Wt 220 lb 12.8 oz (100.2 kg)   LMP 08/22/2017 Comment: says she just spots.  SpO2 98%   BMI 34.58 kg/m   Vitals:   10/29/22 1448  BP: 130/75  Pulse: (!) 126  Height: 5\' 7"  (1.702 m)  Weight: 220 lb 12.8 oz (100.2 kg)  SpO2: 98%  BMI (Calculated): 34.57    Physical Exam   No results found for any visits on 10/29/22.  Recent Results (from the past 2160 hour(s))  Basic metabolic panel     Status: Abnormal   Collection Time: 08/31/22  4:33 PM  Result Value Ref Range   Sodium 139 135 - 145 mmol/L   Potassium 3.5 3.5 - 5.1 mmol/L   Chloride 108 98 - 111 mmol/L   CO2 24 22 - 32 mmol/L   Glucose, Bld 109 (H) 70 - 99 mg/dL    Comment: Glucose reference range applies only to samples taken after fasting for at least 8 hours.   BUN 7 6 - 20 mg/dL   Creatinine, Ser 1.61 0.44 - 1.00 mg/dL   Calcium 8.9 8.9 - 09.6 mg/dL   GFR, Estimated >04 >54 mL/min    Comment: (NOTE) Calculated using the CKD-EPI Creatinine Equation (2021)    Anion gap 7 5 - 15     Comment: Performed at [facility], [address] [place], [place] [address]  CBC     Status: None   Collection Time: 08/31/22  4:33 PM  Result Value Ref Range   WBC 8.8 4.0 - 10.5 K/uL   RBC 4.83 3.87 - 5.11 MIL/uL   Hemoglobin 13.7 12.0 - 15.0 g/dL   HCT 91.4 78.2 - 95.6 %   MCV 89.6 80.0 - 100.0 fL   MCH 28.4 26.0 - 34.0 pg   MCHC 31.6 30.0 - 36.0 g/dL   RDW 21.3 08.6 - 57.8 %   Platelets 287 150 - 400 K/uL   nRBC 0.0 0.0 - 0.2 %    Comment: Performed at [facility], [address] [place], [place] [address]  Troponin I (High Sensitivity)     Status: Abnormal   Collection Time: 08/31/22  4:33 PM  Result Value Ref Range   Troponin I (High Sensitivity) 317 (HH) <18 ng/L    Comment: CRITICAL RESULT CALLED TO, READ BACK BY AND VERIFIED WITH JACQUE FERGUSON 08/31/22 1712 MU (NOTE) Elevated high sensitivity troponin I (hsTnI) values and significant  changes across serial measurements may suggest ACS but many other  chronic and acute conditions are known to elevate hsTnI results.  Refer to the Links section for chest pain algorithms and additional  guidance. Performed at [facility], [address] [place], [place] [address]   Hepatic function panel     Status: None   Collection Time: 08/31/22  4:33 PM  Result Value Ref Range   Total Protein 6.5 6.5 - 8.1 g/dL   Albumin 3.5 3.5 - 5.0 g/dL   AST 38 15 - 41 U/L   ALT 36 0 - 44 U/L   Alkaline Phosphatase 71 38 - 126 U/L   Total Bilirubin 0.4 0.3 - 1.2  mg/dL   Bilirubin, Direct <6.9 0.0 - 0.2 mg/dL   Indirect Bilirubin NOT CALCULATED 0.3 - 0.9 mg/dL    Comment: Performed at [facility], [address] [place], [place] [address]  Gamma GT     Status: None   Collection Time: 08/31/22  4:33 PM  Result Value Ref Range   GGT 24 7 - 50 U/L    Comment: Performed at [facility], [address] [place], [place] [address]  Lipase, blood     Status: None   Collection Time:  08/31/22  4:33 PM  Result Value Ref Range   Lipase 30 11 - 51 U/L    Comment: Performed at [facility], [address] [place], [place] [address]  Hemoglobin A1c     Status: None   Collection Time: 08/31/22  4:33 PM  Result Value Ref Range   Hgb A1c MFr Bld 5.2 4.8 - 5.6 %    Comment: (NOTE)         Prediabetes: 5.7 - 6.4         Diabetes: >6.4         Glycemic control for adults with diabetes: <7.0    Mean Plasma Glucose 103 mg/dL    Comment: (NOTE) Performed At: [facility] [address] [place], [place] [id number] Jolene Schimke MD IH:[id number]   Troponin I (High Sensitivity)     Status: Abnormal   Collection Time: 08/31/22  6:50 PM  Result Value Ref Range   Troponin I (High Sensitivity) 576 (HH) <18 ng/L    Comment: CRITICAL VALUE NOTED. VALUE IS CONSISTENT WITH PREVIOUSLY REPORTED/CALLED VALUE MU (NOTE) Elevated high sensitivity troponin I (hsTnI) values and significant  changes across serial measurements may suggest ACS but many other  chronic and acute conditions are known to elevate hsTnI results.  Refer to the "Links" section for chest pain algorithms and additional  guidance. Performed at [facility], [address] [place], [place] [address]   APTT     Status: None   Collection Time: 08/31/22  6:50 PM  Result Value Ref Range   aPTT 27 24 - 36 seconds    Comment: Performed at [facility], [address] [place], [place] [address]  Protime-INR     Status: None   Collection Time: 08/31/22  6:50 PM  Result Value Ref Range   Prothrombin Time 13.2 11.4 - 15.2 seconds   INR 1.0 0.8 - 1.2    Comment: (NOTE) INR goal varies based on device and disease states. Performed at [facility], [address] [place], [place] [address]   Blood culture (routine x 2)     Status: Abnormal   Collection Time: 08/31/22  9:01 PM   Specimen: BLOOD LEFT HAND  Result Value Ref Range   Specimen Description      BLOOD LEFT  HAND Performed at [facility], [address], [place], [place] [address]    Special Requests      BOTTLES DRAWN AEROBIC AND ANAEROBIC Blood Culture adequate volume Performed at [facility], [address] [place], [place] [address]    Culture  Setup Time      IN BOTH AEROBIC AND ANAEROBIC BOTTLES GRAM POSITIVE COCCI Organism ID to follow CRITICAL RESULT CALLED TO, READ BACK BY AND VERIFIED WITH: VERA MADUEMA 09/01/22 @ 1114 BY SB Performed at [facility], [address] [place], [place] [address]    Culture STREPTOCOCCUS MITIS/ORALIS (A)  Report Status 09/03/2022 FINAL    Organism ID, Bacteria STREPTOCOCCUS MITIS/ORALIS       Susceptibility   Streptococcus mitis/oralis - MIC*    TETRACYCLINE 0.5 SENSITIVE Sensitive     VANCOMYCIN 0.5 SENSITIVE Sensitive     CLINDAMYCIN <=0.25 SENSITIVE Sensitive     * STREPTOCOCCUS MITIS/ORALIS  Blood Culture ID Panel (Reflexed)     Status: Abnormal   Collection Time: 08/31/22  9:01 PM  Result Value Ref Range   Enterococcus faecalis NOT DETECTED NOT DETECTED   Enterococcus Faecium NOT DETECTED NOT DETECTED   Listeria monocytogenes NOT DETECTED NOT DETECTED   Staphylococcus species NOT DETECTED NOT DETECTED   Staphylococcus aureus (BCID) NOT DETECTED NOT DETECTED   Staphylococcus epidermidis NOT DETECTED NOT DETECTED   Staphylococcus lugdunensis NOT DETECTED NOT DETECTED   Streptococcus species DETECTED (A) NOT DETECTED    Comment: Not Enterococcus species, Streptococcus agalactiae, Streptococcus pyogenes, or Streptococcus pneumoniae. CRITICAL RESULT CALLED TO, READ BACK BY AND VERIFIED WITH: VERA MADUEME 09/01/22 @ 1114 BY SB    Streptococcus agalactiae NOT DETECTED NOT DETECTED   Streptococcus pneumoniae NOT DETECTED NOT DETECTED   Streptococcus pyogenes NOT DETECTED NOT DETECTED   A.calcoaceticus-baumannii NOT DETECTED NOT DETECTED   Bacteroides fragilis NOT DETECTED NOT DETECTED   Enterobacterales NOT  DETECTED NOT DETECTED   Enterobacter cloacae complex NOT DETECTED NOT DETECTED   Escherichia coli NOT DETECTED NOT DETECTED   Klebsiella aerogenes NOT DETECTED NOT DETECTED   Klebsiella oxytoca NOT DETECTED NOT DETECTED   Klebsiella pneumoniae NOT DETECTED NOT DETECTED   Proteus species NOT DETECTED NOT DETECTED   Salmonella species NOT DETECTED NOT DETECTED   Serratia marcescens NOT DETECTED NOT DETECTED   Haemophilus influenzae NOT DETECTED NOT DETECTED   Neisseria meningitidis NOT DETECTED NOT DETECTED   Pseudomonas aeruginosa NOT DETECTED NOT DETECTED   Stenotrophomonas maltophilia NOT DETECTED NOT DETECTED   Candida albicans NOT DETECTED NOT DETECTED   Candida auris NOT DETECTED NOT DETECTED   Candida glabrata NOT DETECTED NOT DETECTED   Candida krusei NOT DETECTED NOT DETECTED   Candida parapsilosis NOT DETECTED NOT DETECTED   Candida tropicalis NOT DETECTED NOT DETECTED   Cryptococcus neoformans/gattii NOT DETECTED NOT DETECTED    Comment: Performed at [facility], [address] [place], [place] [address]  Urine Drug Screen, Qualitative ([facility] only)     Status: Abnormal   Collection Time: 08/31/22 10:00 PM  Result Value Ref Range   Tricyclic, Ur Screen NONE DETECTED NONE DETECTED   Amphetamines, Ur Screen NONE DETECTED NONE DETECTED   MDMA (Ecstasy)Ur Screen NONE DETECTED NONE DETECTED   Cocaine Metabolite,Ur [place] POSITIVE (A) NONE DETECTED   Opiate, Ur Screen NONE DETECTED NONE DETECTED   Phencyclidine (PCP) Ur S NONE DETECTED NONE DETECTED   Cannabinoid 50 Ng, Ur [place] NONE DETECTED NONE DETECTED   Barbiturates, Ur Screen NONE DETECTED NONE DETECTED   Benzodiazepine, Ur Scrn NONE DETECTED NONE DETECTED   Methadone Scn, Ur NONE DETECTED NONE DETECTED    Comment: (NOTE) Tricyclics + metabolites, urine    Cutoff 1000 ng/mL Amphetamines + metabolites, urine  Cutoff 1000 ng/mL MDMA (Ecstasy), urine              Cutoff 500 ng/mL Cocaine Metabolite, urine           Cutoff 300 ng/mL Opiate + metabolites, urine        Cutoff 300 ng/mL Phencyclidine (PCP), urine         Cutoff 25 ng/mL Cannabinoid, urine  Cutoff 50 ng/mL Barbiturates + metabolites, urine  Cutoff 200 ng/mL Benzodiazepine, urine              Cutoff 200 ng/mL Methadone, urine                   Cutoff 300 ng/mL  The urine drug screen provides only a preliminary, unconfirmed analytical test result and should not be used for non-medical purposes. Clinical consideration and professional judgment should be applied to any positive drug screen result due to possible interfering substances. A more specific alternate chemical method must be used in order to obtain a confirmed analytical result. Gas chromatography / mass spectrometry (GC/MS) is the preferred confirm atory method. Performed at [facility], [address] [place], [place] [address]   Troponin I (High Sensitivity)     Status: Abnormal   Collection Time: 08/31/22 10:00 PM  Result Value Ref Range   Troponin I (High Sensitivity) 1,031 (HH) <18 ng/L    Comment: CRITICAL VALUE NOTED. VALUE IS CONSISTENT WITH PREVIOUSLY REPORTED/CALLED VALUE RH (NOTE) Elevated high sensitivity troponin I (hsTnI) values and significant  changes across serial measurements may suggest ACS but many other  chronic and acute conditions are known to elevate hsTnI results.  Refer to the "Links" section for chest pain algorithms and additional  guidance. Performed at [facility], [address] [place], [place] [address]   T4, free     Status: Abnormal   Collection Time: 08/31/22 10:00 PM  Result Value Ref Range   Free T4 1.74 (H) 0.61 - 1.12 ng/dL    Comment: (NOTE) Biotin ingestion may interfere with free T4 tests. If the results are inconsistent with the TSH level, previous test results, or the clinical presentation, then consider biotin interference. If needed, order repeat testing after stopping  biotin. Performed at [facility], [address] [place], [place] [address]   TSH     Status: Abnormal   Collection Time: 08/31/22 10:00 PM  Result Value Ref Range   TSH <0.010 (L) 0.350 - 4.500 uIU/mL    Comment: Performed by a 3rd Generation assay with a functional sensitivity of <=0.01 uIU/mL. Performed at [facility], [address] [place], [place] [address]   Lipid panel     Status: None   Collection Time: 08/31/22 10:00 PM  Result Value Ref Range   Cholesterol 135 0 - 200 mg/dL   Triglycerides 696 <295 mg/dL   HDL 46 >28 mg/dL   Total CHOL/HDL Ratio 2.9 RATIO   VLDL 22 0 - 40 mg/dL   LDL Cholesterol 67 0 - 99 mg/dL    Comment:        Total Cholesterol/HDL:CHD Risk Coronary Heart Disease Risk Table                     Men   Women  1/2 Average Risk   3.4   3.3  Average Risk       5.0   4.4  2 X Average Risk   9.6   7.1  3 X Average Risk  23.4   11.0        Use the calculated Patient Ratio above and the CHD Risk Table to determine the patient's CHD Risk.        ATP III CLASSIFICATION (LDL):  <100     mg/dL   Optimal  413-244  mg/dL   Near or Above  Optimal  130-159  mg/dL   Borderline  846-962  mg/dL   High  >952     mg/dL   Very High Performed at [facility], [address] [place], [place] [address]   Troponin I (High Sensitivity)     Status: Abnormal   Collection Time: 09/01/22 12:06 AM  Result Value Ref Range   Troponin I (High Sensitivity) 1,163 (HH) <18 ng/L    Comment: CRITICAL VALUE NOTED. VALUE IS CONSISTENT WITH PREVIOUSLY REPORTED/CALLED VALUE RH (NOTE) Elevated high sensitivity troponin I (hsTnI) values and significant  changes across serial measurements may suggest ACS but many other  chronic and acute conditions are known to elevate hsTnI results.  Refer to the "Links" section for chest pain algorithms and additional  guidance. Performed at [facility], [address]  [place], [place] [address]   Heparin level (unfractionated)     Status: Abnormal   Collection Time: 09/01/22  2:02 AM  Result Value Ref Range   Heparin Unfractionated <0.10 (L) 0.30 - 0.70 IU/mL    Comment: REPEATED TO VERIFY Performed at [facility], [address] [place], [place] [address]   CBC     Status: None   Collection Time: 09/01/22  2:02 AM  Result Value Ref Range   WBC 8.4 4.0 - 10.5 K/uL   RBC 4.23 3.87 - 5.11 MIL/uL   Hemoglobin 12.4 12.0 - 15.0 g/dL   HCT 66.4 40.3 - 47.4 %   MCV 91.0 80.0 - 100.0 fL   MCH 29.3 26.0 - 34.0 pg   MCHC 32.2 30.0 - 36.0 g/dL   RDW 25.9 56.3 - 87.5 %   Platelets 237 150 - 400 K/uL   nRBC 0.0 0.0 - 0.2 %    Comment: Performed at [facility], [address] [place], [place] [address]  Comprehensive metabolic panel     Status: Abnormal   Collection Time: 09/01/22  2:02 AM  Result Value Ref Range   Sodium 138 135 - 145 mmol/L   Potassium 3.2 (L) 3.5 - 5.1 mmol/L   Chloride 108 98 - 111 mmol/L   CO2 25 22 - 32 mmol/L   Glucose, Bld 98 70 - 99 mg/dL    Comment: Glucose reference range applies only to samples taken after fasting for at least 8 hours.   BUN 7 6 - 20 mg/dL   Creatinine, Ser 9.51 0.44 - 1.00 mg/dL   Calcium 8.3 (L) 8.9 - 10.3 mg/dL   Total Protein 6.0 (L) 6.5 - 8.1 g/dL   Albumin 3.4 (L) 3.5 - 5.0 g/dL   AST 40 15 - 41 U/L   ALT 34 0 - 44 U/L   Alkaline Phosphatase 64 38 - 126 U/L   Total Bilirubin 0.5 0.3 - 1.2 mg/dL   GFR, Estimated >88 >41 mL/min    Comment: (NOTE) Calculated using the CKD-EPI Creatinine Equation (2021)    Anion gap 5 5 - 15    Comment: Performed at [facility], [address] [place], [place] [address]  Magnesium     Status: None   Collection Time: 09/01/22  2:02 AM  Result Value Ref Range   Magnesium 1.9 1.7 - 2.4 mg/dL    Comment: Performed at [facility], [address] [place], [place] [address]  Troponin I (High Sensitivity)     Status: Abnormal    Collection Time: 09/01/22  8:55 AM  Result Value Ref Range   Troponin I (High Sensitivity)  765 (HH) <18 ng/L    Comment: CRITICAL VALUE NOTED. VALUE IS CONSISTENT WITH PREVIOUSLY REPORTED/CALLED VALUE MW (NOTE) Elevated high sensitivity troponin I (hsTnI) values and significant  changes across serial measurements may suggest ACS but many other  chronic and acute conditions are known to elevate hsTnI results.  Refer to the "Links" section for chest pain algorithms and additional  guidance. Performed at [facility], [address] [place], [place] [address]   Phosphorus     Status: None   Collection Time: 09/01/22  8:55 AM  Result Value Ref Range   Phosphorus 4.0 2.5 - 4.6 mg/dL    Comment: Performed at [facility], [address] [place], [place] [address]  Blood culture (routine x 2)     Status: None   Collection Time: 09/01/22  8:57 AM   Specimen: BLOOD  Result Value Ref Range   Specimen Description BLOOD RIGHT HAND    Special Requests      BOTTLES DRAWN AEROBIC AND ANAEROBIC Blood Culture adequate volume   Culture      NO GROWTH 5 DAYS Performed at [facility], [address], [place], [place] [address]    Report Status 09/06/2022 FINAL   Heparin level (unfractionated)     Status: Abnormal   Collection Time: 09/01/22  8:57 AM  Result Value Ref Range   Heparin Unfractionated <0.10 (L) 0.30 - 0.70 IU/mL    Comment: (NOTE) The clinical reportable range upper limit is being lowered to >1.10 to align with the FDA approved guidance for the current laboratory assay.  If heparin results are below expected values, and patient dosage has  been confirmed, suggest follow up testing of antithrombin III levels. Performed at [facility], [address] [place], [place] [address]   Troponin I (High Sensitivity)     Status: Abnormal   Collection Time: 09/01/22 10:07 AM  Result Value Ref Range   Troponin I (High Sensitivity) 687 (HH)  <18 ng/L    Comment: CRITICAL VALUE NOTED. VALUE IS CONSISTENT WITH PREVIOUSLY REPORTED/CALLED VALUE MW (NOTE) Elevated high sensitivity troponin I (hsTnI) values and significant  changes across serial measurements may suggest ACS but many other  chronic and acute conditions are known to elevate hsTnI results.  Refer to the "Links" section for chest pain algorithms and additional  guidance. Performed at [facility], [address] [place], [place] [address]   ECHOCARDIOGRAM COMPLETE     Status: None   Collection Time: 09/01/22  2:38 PM  Result Value Ref Range   Weight 3,520.31 oz   Height 67 in   BP 90/66 mmHg   Ao pk vel 1.22 m/s   AV Area VTI 3.36 cm2   AR max vel 2.45 cm2   AV Mean grad 3.0 mmHg   AV Peak grad 6.0 mmHg   Single Plane A2C EF 30.3 %   Single Plane A4C EF 24.5 %   Calc EF 26.5 %   S' Lateral 4.50 cm   AV Area mean vel 2.63 cm2   Area-P 1/2 6.83 cm2   Est EF 30 - 35%   Surgical pcr screen     Status: None   Collection Time: 09/01/22  5:04 PM   Specimen: Nasal Mucosa; Nasal Swab  Result Value Ref Range   MRSA, PCR NEGATIVE NEGATIVE   Staphylococcus aureus NEGATIVE NEGATIVE    Comment: (NOTE) The Xpert SA Assay (FDA approved for NASAL specimens in patients 81 years of age and  older), is one component of a comprehensive surveillance program. It is not intended to diagnose infection nor to guide or monitor treatment. Performed at [facility], [address], [place], [place] [address]   VAS US DOPPLER PRE CABG     Status: None   Collection Time: 09/01/22  5:38 PM  Result Value Ref Range   Right ABI 1.15    Left ABI 1.1   TSH     Status: Abnormal   Collection Time: 09/01/22  5:48 PM  Result Value Ref Range   TSH 0.018 (L) 0.350 - 4.500 uIU/mL    Comment: Performed by a 3rd Generation assay with a functional sensitivity of <=0.01 uIU/mL. Performed at [facility], [address], [place], [place] [address]   T4, free      Status: Abnormal   Collection Time: 09/01/22  5:48 PM  Result Value Ref Range   Free T4 2.13 (H) 0.61 - 1.12 ng/dL    Comment: (NOTE) Biotin ingestion may interfere with free T4 tests. If the results are inconsistent with the TSH level, previous test results, or the clinical presentation, then consider biotin interference. If needed, order repeat testing after stopping biotin. Performed at [facility], [address], [place], [place] [address]   Type and screen [facility]     Status: None   Collection Time: 09/01/22  6:23 PM  Result Value Ref Range   ABO/RH(D) A NEG    Antibody Screen NEG    Sample Expiration 09/04/2022,2359    Unit Number [id number]    Blood Component Type RBC LR PHER2    Unit division 00    Status of Unit REL FROM [facility]    Transfusion Status OK TO TRANSFUSE    Crossmatch Result Compatible    Unit Number [id number]    Blood Component Type RED CELLS,LR    Unit division 00    Status of Unit REL FROM [facility]    Transfusion Status OK TO TRANSFUSE    Crossmatch Result      Compatible Performed at [facility], [address], [place], [place] [address]   Basic metabolic panel     Status: Abnormal   Collection Time: 09/01/22  6:23 PM  Result Value Ref Range   Sodium 140 135 - 145 mmol/L   Potassium 3.7 3.5 - 5.1 mmol/L   Chloride 107 98 - 111 mmol/L   CO2 21 (L) 22 - 32 mmol/L   Glucose, Bld 108 (H) 70 - 99 mg/dL    Comment: Glucose reference range applies only to samples taken after fasting for at least 8 hours.   BUN 5 (L) 6 - 20 mg/dL   Creatinine, Ser 2.20 0.44 - 1.00 mg/dL   Calcium 8.5 (L) 8.9 - 10.3 mg/dL   GFR, Estimated >25 >42 mL/min    Comment: (NOTE) Calculated using the CKD-EPI Creatinine Equation (2021)    Anion gap 12 5 - 15    Comment: Performed at [facility], [address], [place], [place] [address]  CBC     Status: None   Collection Time: 09/01/22  6:23 PM  Result Value Ref Range   WBC  9.1 4.0 - 10.5 K/uL   RBC 4.43 3.87 - 5.11 MIL/uL   Hemoglobin 13.2 12.0 - 15.0 g/dL   HCT 76.2 83.1 - 51.7 %   MCV 91.9 80.0 - 100.0 fL   MCH 29.8 26.0 - 34.0 pg  MCHC 32.4 30.0 - 36.0 g/dL   RDW 40.9 81.1 - 91.4 %   Platelets 233 150 - 400 K/uL   nRBC 0.0 0.0 - 0.2 %    Comment: Performed at [facility], [address], [place], [place] [address]  BPAM RBC     Status: None   Collection Time: 09/01/22  6:23 PM  Result Value Ref Range   ISSUE DATE / TIME [id number]    Blood Product Unit Number [id number]    PRODUCT CODE [id number]    Unit Type and Rh 0600    Blood Product Expiration Date 664403474259    Blood Product Unit Number [id number]    PRODUCT CODE [id number]    Unit Type and Rh 0600    Blood Product Expiration Date 630160109323   Lipoprotein A (LPA)     Status: None   Collection Time: 09/02/22  5:31 AM  Result Value Ref Range   Lipoprotein (a) 15.7 <75.0 nmol/L    Comment: (NOTE) Note:  Values greater than or equal to 75.0 nmol/L may       indicate an independent risk factor for CHD,       but must be evaluated with caution when applied       to non-Caucasian populations due to the       influence of genetic factors on Lp(a) across       ethnicities. Performed At: [facility] [address] [place], [place] [id number] Jolene Schimke MD [id number]   Basic metabolic panel     Status: Abnormal   Collection Time: 09/02/22  5:31 AM  Result Value Ref Range   Sodium 139 135 - 145 mmol/L   Potassium 3.8 3.5 - 5.1 mmol/L   Chloride 107 98 - 111 mmol/L   CO2 23 22 - 32 mmol/L   Glucose, Bld 101 (H) 70 - 99 mg/dL    Comment: Glucose reference range applies only to samples taken after fasting for at least 8 hours.   BUN 6 6 - 20 mg/dL   Creatinine, Ser 1.51 0.44 - 1.00 mg/dL   Calcium 8.4 (L) 8.9 - 10.3 mg/dL   GFR, Estimated >76 >16 mL/min    Comment: (NOTE) Calculated using the CKD-EPI Creatinine Equation (2021)    Anion gap 9 5 - 15     Comment: Performed at [facility], [address], [place], [place] [address]  CBC     Status: Abnormal   Collection Time: 09/02/22  5:31 AM  Result Value Ref Range   WBC 7.5 4.0 - 10.5 K/uL   RBC 4.04 3.87 - 5.11 MIL/uL   Hemoglobin 11.8 (L) 12.0 - 15.0 g/dL   HCT 06.2 69.4 - 85.4 %   MCV 89.1 80.0 - 100.0 fL   MCH 29.2 26.0 - 34.0 pg   MCHC 32.8 30.0 - 36.0 g/dL   RDW 62.7 03.5 - 00.9 %   Platelets 216 150 - 400 K/uL   nRBC 0.0 0.0 - 0.2 %    Comment: Performed at [facility], [address] [place], [place] [address]  Prepare RBC (crossmatch)     Status: None   Collection Time: 09/02/22  6:33 AM  Result Value Ref Range   Order Confirmation      ORDER PROCESSED BY BLOOD BANK Performed at [facility], [address], [place], [place] [address]   ECHO INTRAOPERATIVE TEE     Status: None   Collection Time: 09/02/22  7:19 AM  Result Value Ref Range   Weight 3,693.15 oz   Height 67 in   BP 109/85 mmHg  I-STAT 7, (LYTES, BLD GAS, ICA, H+H)     Status: Abnormal   Collection Time: 09/02/22  8:06 AM  Result Value Ref Range   pH, Arterial 7.334 (L) 7.35 - 7.45   pCO2 arterial 44.6 32 - 48 mmHg   pO2, Arterial 150 (H) 83 - 108 mmHg   Bicarbonate 23.7 20.0 - 28.0 mmol/L   TCO2 25 22 - 32 mmol/L   O2 Saturation 99 %   Acid-base deficit 2.0 0.0 - 2.0 mmol/L   Sodium 139 135 - 145 mmol/L   Potassium 3.8 3.5 - 5.1 mmol/L   Calcium, Ion 1.29 1.15 - 1.40 mmol/L   HCT 34.0 (L) 36.0 - 46.0 %   Hemoglobin 11.6 (L) 12.0 - 15.0 g/dL   Sample type ARTERIAL   I-STAT, chem 8     Status: Abnormal   Collection Time: 09/02/22  8:14 AM  Result Value Ref Range   Sodium 139 135 - 145 mmol/L   Potassium 3.8 3.5 - 5.1 mmol/L   Chloride 106 98 - 111 mmol/L   BUN 4 (L) 6 - 20 mg/dL   Creatinine, Ser 7.82 (L) 0.44 - 1.00 mg/dL   Glucose, Bld 956 (H) 70 - 99 mg/dL    Comment: Glucose reference range applies only to samples taken after fasting for at least 8 hours.   Calcium,  Ion 1.27 1.15 - 1.40 mmol/L   TCO2 25 22 - 32 mmol/L   Hemoglobin 11.2 (L) 12.0 - 15.0 g/dL   HCT 21.3 (L) 08.6 - 57.8 %  I-STAT, chem 8     Status: Abnormal   Collection Time: 09/02/22  9:50 AM  Result Value Ref Range   Sodium 137 135 - 145 mmol/L   Potassium 5.1 3.5 - 5.1 mmol/L   Chloride 106 98 - 111 mmol/L   BUN 5 (L) 6 - 20 mg/dL   Creatinine, Ser 4.69 (L) 0.44 - 1.00 mg/dL   Glucose, Bld 96 70 - 99 mg/dL    Comment: Glucose reference range applies only to samples taken after fasting for at least 8 hours.   Calcium, Ion 1.21 1.15 - 1.40 mmol/L   TCO2 29 22 - 32 mmol/L   Hemoglobin 11.9 (L) 12.0 - 15.0 g/dL   HCT 62.9 (L) 52.8 - 41.3 %  I-STAT 7, (LYTES, BLD GAS, ICA, H+H)     Status: Abnormal   Collection Time: 09/02/22 10:15 AM  Result Value Ref Range   pH, Arterial 7.339 (L) 7.35 - 7.45   pCO2 arterial 44.0 32 - 48 mmHg   pO2, Arterial 314 (H) 83 - 108 mmHg   Bicarbonate 23.6 20.0 - 28.0 mmol/L   TCO2 25 22 - 32 mmol/L   O2 Saturation 100 %   Acid-base deficit 2.0 0.0 - 2.0 mmol/L   Sodium 139 135 - 145 mmol/L   Potassium 4.6 3.5 - 5.1 mmol/L   Calcium, Ion 1.05 (L) 1.15 - 1.40 mmol/L   HCT 28.0 (L) 36.0 - 46.0 %   Hemoglobin 9.5 (L) 12.0 - 15.0 g/dL   Sample type ARTERIAL   POCT I-Stat EG7     Status: Abnormal   Collection Time: 09/02/22 10:30 AM  Result Value Ref Range   pH, Ven 7.296 7.25 - 7.43   pCO2, Ven 49.8 44 - 60 mmHg   pO2, Ven 46 (H) 32 - 45  mmHg   Bicarbonate 24.2 20.0 - 28.0 mmol/L   TCO2 26 22 - 32 mmol/L   O2 Saturation 76 %   Acid-base deficit 2.0 0.0 - 2.0 mmol/L   Sodium 139 135 - 145 mmol/L   Potassium 5.0 3.5 - 5.1 mmol/L   Calcium, Ion 1.09 (L) 1.15 - 1.40 mmol/L   HCT 26.0 (L) 36.0 - 46.0 %   Hemoglobin 8.8 (L) 12.0 - 15.0 g/dL   Sample type VENOUS   Platelet count     Status: None   Collection Time: 09/02/22 10:41 AM  Result Value Ref Range   Platelets 187 150 - 400 K/uL    Comment: Performed at [facility], [address] [place], [place] [address]  Hemoglobin and hematocrit, blood     Status: Abnormal   Collection Time: 09/02/22 10:41 AM  Result Value Ref Range   Hemoglobin 8.5 (L) 12.0 - 15.0 g/dL    Comment: REPEATED TO VERIFY RESULTS CALLED TO AND READ BACK BY J.SIU RN [id number] MCCORMICK K    HCT 26.8 (L) 36.0 - 46.0 %    Comment: Performed at [facility], [address], [place], [place] [address]  I-STAT, [place] 8     Status: Abnormal   Collection Time: 09/02/22 10:45 AM  Result Value Ref Range   Sodium 139 135 - 145 mmol/L   Potassium 5.1 3.5 - 5.1 mmol/L   Chloride 105 98 - 111 mmol/L   BUN 4 (L) 6 - 20 mg/dL   Creatinine, Ser 1.47 (L) 0.44 - 1.00 mg/dL   Glucose, Bld 829 (H) 70 - 99 mg/dL    Comment: Glucose reference range applies only to samples taken after fasting for at least 8 hours.   Calcium, Ion 1.15 1.15 - 1.40 mmol/L   TCO2 23 22 - 32 mmol/L   Hemoglobin 8.8 (L) 12.0 - 15.0 g/dL   HCT 56.2 (L) 13.0 - 86.5 %  I-STAT 7, (LYTES, BLD GAS, ICA, H+H)     Status: Abnormal   Collection Time: 09/02/22 11:23 AM  Result Value Ref Range   pH, Arterial 7.282 (L) 7.35 - 7.45   pCO2 arterial 45.3 32 - 48 mmHg   pO2, Arterial 154 (H) 83 - 108 mmHg   Bicarbonate 21.4 20.0 - 28.0 mmol/L   TCO2 23 22 - 32 mmol/L   O2 Saturation 99 %   Acid-base deficit 5.0 (H) 0.0 - 2.0 mmol/L   Sodium 141 135 - 145 mmol/L   Potassium 3.8 3.5 - 5.1 mmol/L   Calcium, Ion 1.16 1.15 - 1.40 mmol/L   HCT 27.0 (L) 36.0 - 46.0 %   Hemoglobin 9.2 (L) 12.0 - 15.0 g/dL   Sample type ARTERIAL   I-STAT, chem 8     Status: Abnormal   Collection Time: 09/02/22 11:26 AM  Result Value Ref Range   Sodium 141 135 - 145 mmol/L   Potassium 3.7 3.5 - 5.1 mmol/L   Chloride 105 98 - 111 mmol/L   BUN 4 (L) 6 - 20 mg/dL   Creatinine, Ser 7.84 0.44 - 1.00 mg/dL   Glucose, Bld 696 (H) 70 - 99 mg/dL    Comment: Glucose reference range applies only to samples taken after fasting for at least 8 hours.   Calcium, Ion 1.17  1.15 - 1.40 mmol/L   TCO2 25 22 - 32 mmol/L   Hemoglobin 10.2 (L) 12.0 - 15.0 g/dL   HCT 29.5 (L) 28.4 - 13.2 %  Glucose, capillary     Status: Abnormal   Collection Time: 09/02/22 12:42 PM  Result Value Ref Range   Glucose-Capillary 141 (H) 70 - 99 mg/dL    Comment: Glucose reference range applies only to samples taken after fasting for at least 8 hours.  I-STAT 7, (LYTES, BLD GAS, ICA, H+H)     Status: Abnormal   Collection Time: 09/02/22 12:45 PM  Result Value Ref Range   pH, Arterial 7.234 (L) 7.35 - 7.45   pCO2 arterial 54.7 (H) 32 - 48 mmHg   pO2, Arterial 64 (L) 83 - 108 mmHg   Bicarbonate 23.2 20.0 - 28.0 mmol/L   TCO2 25 22 - 32 mmol/L   O2 Saturation 88 %   Acid-base deficit 5.0 (H) 0.0 - 2.0 mmol/L   Sodium 142 135 - 145 mmol/L   Potassium 3.9 3.5 - 5.1 mmol/L   Calcium, Ion 1.21 1.15 - 1.40 mmol/L   HCT 38.0 36.0 - 46.0 %   Hemoglobin 12.9 12.0 - 15.0 g/dL   Patient temperature 16.1 C    Sample type ARTERIAL   Protime-INR     Status: Abnormal   Collection Time: 09/02/22 12:47 PM  Result Value Ref Range   Prothrombin Time 17.7 (H) 11.4 - 15.2 seconds   INR 1.4 (H) 0.8 - 1.2    Comment: (NOTE) INR goal varies based on device and disease states. Performed at [facility], [address] [place], [place] [address]   APTT     Status: None   Collection Time: 09/02/22 12:47 PM  Result Value Ref Range   aPTT 29 24 - 36 seconds    Comment: Performed at [facility], [address] [place], [place] [address]  Glucose, capillary     Status: Abnormal   Collection Time: 09/02/22  1:38 PM  Result Value Ref Range   Glucose-Capillary 109 (H) 70 - 99 mg/dL    Comment: Glucose reference range applies only to samples taken after fasting for at least 8 hours.  CBC     Status: Abnormal   Collection Time: 09/02/22  1:39 PM  Result Value Ref Range   WBC 23.5 (H) 4.0 - 10.5 K/uL   RBC 4.32 3.87 - 5.11 MIL/uL   Hemoglobin 12.5 12.0 - 15.0 g/dL   HCT 11.9 14.7 -  82.9 %   MCV 91.4 80.0 - 100.0 fL   MCH 28.9 26.0 - 34.0 pg   MCHC 31.6 30.0 - 36.0 g/dL   RDW 56.2 13.0 - 86.5 %   Platelets 238 150 - 400 K/uL   nRBC 0.0 0.0 - 0.2 %    Comment: Performed at [facility], [address] [place], [place] [address]  Glucose, capillary     Status: Abnormal   Collection Time: 09/02/22  2:21 PM  Result Value Ref Range   Glucose-Capillary 137 (H) 70 - 99 mg/dL    Comment: Glucose reference range applies only to samples taken after fasting for at least 8 hours.  Glucose, capillary     Status: Abnormal   Collection Time: 09/02/22  3:05 PM  Result Value Ref Range   Glucose-Capillary 120 (H) 70 - 99 mg/dL    Comment: Glucose reference range applies only to samples taken after fasting for at least 8 hours.  Glucose, capillary     Status: Abnormal   Collection Time: 09/02/22  4:03 PM  Result Value Ref Range   Glucose-Capillary 139 (H) 70 - 99  mg/dL    Comment: Glucose reference range applies only to samples taken after fasting for at least 8 hours.  I-STAT 7, (LYTES, BLD GAS, ICA, H+H)     Status: Abnormal   Collection Time: 09/02/22  4:04 PM  Result Value Ref Range   pH, Arterial 7.361 7.35 - 7.45   pCO2 arterial 39.7 32 - 48 mmHg   pO2, Arterial 92 83 - 108 mmHg   Bicarbonate 22.4 20.0 - 28.0 mmol/L   TCO2 24 22 - 32 mmol/L   O2 Saturation 97 %   Acid-base deficit 3.0 (H) 0.0 - 2.0 mmol/L   Sodium 140 135 - 145 mmol/L   Potassium 4.5 3.5 - 5.1 mmol/L   Calcium, Ion 1.19 1.15 - 1.40 mmol/L   HCT 38.0 36.0 - 46.0 %   Hemoglobin 12.9 12.0 - 15.0 g/dL   Patient temperature 27.0 C    Sample type ARTERIAL   Glucose, capillary     Status: Abnormal   Collection Time: 09/02/22  5:01 PM  Result Value Ref Range   Glucose-Capillary 101 (H) 70 - 99 mg/dL    Comment: Glucose reference range applies only to samples taken after fasting for at least 8 hours.  I-STAT 7, (LYTES, BLD GAS, ICA, H+H)     Status: Abnormal   Collection Time: 09/02/22  5:01 PM   Result Value Ref Range   pH, Arterial 7.364 7.35 - 7.45   pCO2 arterial 34.4 32 - 48 mmHg   pO2, Arterial 82 (L) 83 - 108 mmHg   Bicarbonate 19.5 (L) 20.0 - 28.0 mmol/L   TCO2 21 (L) 22 - 32 mmol/L   O2 Saturation 96 %   Acid-base deficit 5.0 (H) 0.0 - 2.0 mmol/L   Sodium 140 135 - 145 mmol/L   Potassium 3.8 3.5 - 5.1 mmol/L   Calcium, Ion 1.16 1.15 - 1.40 mmol/L   HCT 36.0 36.0 - 46.0 %   Hemoglobin 12.2 12.0 - 15.0 g/dL   Patient temperature 35.0 C    Sample type ARTERIAL   Glucose, capillary     Status: Abnormal   Collection Time: 09/02/22  6:10 PM  Result Value Ref Range   Glucose-Capillary 142 (H) 70 - 99 mg/dL    Comment: Glucose reference range applies only to samples taken after fasting for at least 8 hours.  Basic metabolic panel     Status: Abnormal   Collection Time: 09/02/22  6:11 PM  Result Value Ref Range   Sodium 139 135 - 145 mmol/L   Potassium 4.3 3.5 - 5.1 mmol/L   Chloride 109 98 - 111 mmol/L   CO2 22 22 - 32 mmol/L   Glucose, Bld 147 (H) 70 - 99 mg/dL    Comment: Glucose reference range applies only to samples taken after fasting for at least 8 hours.   BUN 6 6 - 20 mg/dL   Creatinine, Ser 0.93 0.44 - 1.00 mg/dL   Calcium 8.1 (L) 8.9 - 10.3 mg/dL   GFR, Estimated >81 >82 mL/min    Comment: (NOTE) Calculated using the CKD-EPI Creatinine Equation (2021)    Anion gap 8 5 - 15    Comment: Performed at [facility], [address] [place], [place] [address]  CBC     Status: Abnormal   Collection Time: 09/02/22  6:11 PM  Result Value Ref Range   WBC 25.5 (H) 4.0 - 10.5 K/uL   RBC 4.33 3.87 - 5.11 MIL/uL   Hemoglobin 12.7 12.0 -  15.0 g/dL   HCT 08.6 57.8 - 46.9 %   MCV 91.2 80.0 - 100.0 fL   MCH 29.3 26.0 - 34.0 pg   MCHC 32.2 30.0 - 36.0 g/dL   RDW 62.9 52.8 - 41.3 %   Platelets 217 150 - 400 K/uL   nRBC 0.0 0.0 - 0.2 %    Comment: Performed at [facility], [address] [place], [place] [address]  Magnesium     Status: Abnormal    Collection Time: 09/02/22  6:11 PM  Result Value Ref Range   Magnesium 2.6 (H) 1.7 - 2.4 mg/dL    Comment: Performed at [facility], [address], [place], [place] [address]  Glucose, capillary     Status: Abnormal   Collection Time: 09/02/22  7:28 PM  Result Value Ref Range   Glucose-Capillary 171 (H) 70 - 99 mg/dL    Comment: Glucose reference range applies only to samples taken after fasting for at least 8 hours.  Glucose, capillary     Status: Abnormal   Collection Time: 09/02/22  8:00 PM  Result Value Ref Range   Glucose-Capillary 156 (H) 70 - 99 mg/dL    Comment: Glucose reference range applies only to samples taken after fasting for at least 8 hours.  Glucose, capillary     Status: Abnormal   Collection Time: 09/02/22  8:59 PM  Result Value Ref Range   Glucose-Capillary 159 (H) 70 - 99 mg/dL    Comment: Glucose reference range applies only to samples taken after fasting for at least 8 hours.  Glucose, capillary     Status: Abnormal   Collection Time: 09/02/22 10:00 PM  Result Value Ref Range   Glucose-Capillary 141 (H) 70 - 99 mg/dL    Comment: Glucose reference range applies only to samples taken after fasting for at least 8 hours.  Glucose, capillary     Status: Abnormal   Collection Time: 09/02/22 10:56 PM  Result Value Ref Range   Glucose-Capillary 137 (H) 70 - 99 mg/dL    Comment: Glucose reference range applies only to samples taken after fasting for at least 8 hours.  Glucose, capillary     Status: Abnormal   Collection Time: 09/02/22 11:56 PM  Result Value Ref Range   Glucose-Capillary 143 (H) 70 - 99 mg/dL    Comment: Glucose reference range applies only to samples taken after fasting for at least 8 hours.  Glucose, capillary     Status: Abnormal   Collection Time: 09/03/22  1:02 AM  Result Value Ref Range   Glucose-Capillary 147 (H) 70 - 99 mg/dL    Comment: Glucose reference range applies only to samples taken after fasting for at least 8 hours.   Glucose, capillary     Status: Abnormal   Collection Time: 09/03/22  1:58 AM  Result Value Ref Range   Glucose-Capillary 123 (H) 70 - 99 mg/dL    Comment: Glucose reference range applies only to samples taken after fasting for at least 8 hours.  Glucose, capillary     Status: Abnormal   Collection Time: 09/03/22  2:59 AM  Result Value Ref Range   Glucose-Capillary 127 (H) 70 - 99 mg/dL    Comment: Glucose reference range applies only to samples taken after fasting for at least 8 hours.  Glucose, capillary     Status: Abnormal   Collection Time: 09/03/22  3:58 AM  Result Value Ref Range   Glucose-Capillary 117 (H) 70 -  99 mg/dL    Comment: Glucose reference range applies only to samples taken after fasting for at least 8 hours.  CBC     Status: Abnormal   Collection Time: 09/03/22  5:00 AM  Result Value Ref Range   WBC 13.4 (H) 4.0 - 10.5 K/uL   RBC 3.78 (L) 3.87 - 5.11 MIL/uL   Hemoglobin 11.1 (L) 12.0 - 15.0 g/dL   HCT 16.1 (L) 09.6 - 04.5 %   MCV 92.6 80.0 - 100.0 fL   MCH 29.4 26.0 - 34.0 pg   MCHC 31.7 30.0 - 36.0 g/dL   RDW 40.9 81.1 - 91.4 %   Platelets 135 (L) 150 - 400 K/uL    Comment: REPEATED TO VERIFY   nRBC 0.0 0.0 - 0.2 %    Comment: Performed at [facility], [address], [place], [place] [address]  Basic metabolic panel     Status: Abnormal   Collection Time: 09/03/22  5:00 AM  Result Value Ref Range   Sodium 134 (L) 135 - 145 mmol/L   Potassium 4.2 3.5 - 5.1 mmol/L   Chloride 102 98 - 111 mmol/L   CO2 23 22 - 32 mmol/L   Glucose, Bld 119 (H) 70 - 99 mg/dL    Comment: Glucose reference range applies only to samples taken after fasting for at least 8 hours.   BUN 7 6 - 20 mg/dL   Creatinine, Ser 6.21 0.44 - 1.00 mg/dL   Calcium 8.3 (L) 8.9 - 10.3 mg/dL   GFR, Estimated >30 >86 mL/min    Comment: (NOTE) Calculated using the CKD-EPI Creatinine Equation (2021)    Anion gap 9 5 - 15    Comment: Performed at [facility], [address],  [place], [place] [address]  Magnesium     Status: None   Collection Time: 09/03/22  5:00 AM  Result Value Ref Range   Magnesium 2.1 1.7 - 2.4 mg/dL    Comment: Performed at [facility], [address] [place], [place] [address]  Glucose, capillary     Status: Abnormal   Collection Time: 09/03/22  5:55 AM  Result Value Ref Range   Glucose-Capillary 113 (H) 70 - 99 mg/dL    Comment: Glucose reference range applies only to samples taken after fasting for at least 8 hours.  Glucose, capillary     Status: None   Collection Time: 09/03/22  8:25 AM  Result Value Ref Range   Glucose-Capillary 98 70 - 99 mg/dL    Comment: Glucose reference range applies only to samples taken after fasting for at least 8 hours.  Glucose, capillary     Status: Abnormal   Collection Time: 09/03/22 12:06 PM  Result Value Ref Range   Glucose-Capillary 32 (LL) 70 - 99 mg/dL    Comment: Glucose reference range applies only to samples taken after fasting for at least 8 hours.   Comment 1 Repeat Test   Glucose, capillary     Status: None   Collection Time: 09/03/22 12:09 PM  Result Value Ref Range   Glucose-Capillary 84 70 - 99 mg/dL    Comment: Glucose reference range applies only to samples taken after fasting for at least 8 hours.  Glucose, capillary     Status: None   Collection Time: 09/03/22  4:35 PM  Result Value Ref Range   Glucose-Capillary 92 70 - 99 mg/dL    Comment: Glucose reference range applies only to samples taken after  fasting for at least 8 hours.  Basic metabolic panel     Status: None   Collection Time: 09/03/22  5:00 PM  Result Value Ref Range   Sodium 136 135 - 145 mmol/L   Potassium 4.3 3.5 - 5.1 mmol/L   Chloride 99 98 - 111 mmol/L   CO2 25 22 - 32 mmol/L   Glucose, Bld 95 70 - 99 mg/dL    Comment: Glucose reference range applies only to samples taken after fasting for at least 8 hours.   BUN 8 6 - 20 mg/dL   Creatinine, Ser 1.61 0.44 - 1.00 mg/dL   Calcium 8.9 8.9 - 09.6 mg/dL    GFR, Estimated >04 >54 mL/min    Comment: (NOTE) Calculated using the CKD-EPI Creatinine Equation (2021)    Anion gap 12 5 - 15    Comment: Performed at [facility], [address] [place], [place] [address]  Magnesium     Status: None   Collection Time: 09/03/22  5:00 PM  Result Value Ref Range   Magnesium 1.9 1.7 - 2.4 mg/dL    Comment: Performed at [facility], [address] [place], [place] [address]  CBC     Status: Abnormal   Collection Time: 09/03/22  5:00 PM  Result Value Ref Range   WBC 11.5 (H) 4.0 - 10.5 K/uL   RBC 3.44 (L) 3.87 - 5.11 MIL/uL   Hemoglobin 10.0 (L) 12.0 - 15.0 g/dL   HCT 29.5 (L) 62.1 - 30.8 %   MCV 94.2 80.0 - 100.0 fL   MCH 29.1 26.0 - 34.0 pg   MCHC 30.9 30.0 - 36.0 g/dL   RDW 65.7 84.6 - 96.2 %   Platelets 125 (L) 150 - 400 K/uL   nRBC 0.0 0.0 - 0.2 %    Comment: Performed at [facility], [address] [place], [place] [address]  Glucose, capillary     Status: None   Collection Time: 09/03/22  7:51 PM  Result Value Ref Range   Glucose-Capillary 88 70 - 99 mg/dL    Comment: Glucose reference range applies only to samples taken after fasting for at least 8 hours.  Glucose, capillary     Status: None   Collection Time: 09/04/22  1:05 AM  Result Value Ref Range   Glucose-Capillary 93 70 - 99 mg/dL    Comment: Glucose reference range applies only to samples taken after fasting for at least 8 hours.  Glucose, capillary     Status: None   Collection Time: 09/04/22  3:47 AM  Result Value Ref Range   Glucose-Capillary 90 70 - 99 mg/dL    Comment: Glucose reference range applies only to samples taken after fasting for at least 8 hours.  Basic metabolic panel     Status: Abnormal   Collection Time: 09/04/22  5:34 AM  Result Value Ref Range   Sodium 133 (L) 135 - 145 mmol/L   Potassium 4.5 3.5 - 5.1 mmol/L   Chloride 99 98 - 111 mmol/L   CO2 25 22 - 32 mmol/L   Glucose, Bld 98 70 - 99 mg/dL    Comment: Glucose reference  range applies only to samples taken after fasting for at least 8 hours.   BUN 7 6 - 20 mg/dL   Creatinine, Ser 1.32 0.44 - 1.00 mg/dL   Calcium 8.5 (L) 8.9 - 10.3 mg/dL   GFR, Estimated >44 >01 mL/min    Comment: (NOTE) Calculated  using the CKD-EPI Creatinine Equation (2021)    Anion gap 9 5 - 15    Comment: Performed at [facility], [address], [place], [place] [address]  CBC     Status: Abnormal   Collection Time: 09/04/22  5:34 AM  Result Value Ref Range   WBC 10.1 4.0 - 10.5 K/uL   RBC 3.52 (L) 3.87 - 5.11 MIL/uL   Hemoglobin 10.2 (L) 12.0 - 15.0 g/dL   HCT 60.4 (L) 54.0 - 98.1 %   MCV 93.5 80.0 - 100.0 fL   MCH 29.0 26.0 - 34.0 pg   MCHC 31.0 30.0 - 36.0 g/dL   RDW 19.1 47.8 - 29.5 %   Platelets 115 (L) 150 - 400 K/uL    Comment: REPEATED TO VERIFY   nRBC 0.0 0.0 - 0.2 %    Comment: Performed at [facility], [address], [place], [place] [address]  Glucose, capillary     Status: None   Collection Time: 09/04/22  7:36 AM  Result Value Ref Range   Glucose-Capillary 92 70 - 99 mg/dL    Comment: Glucose reference range applies only to samples taken after fasting for at least 8 hours.  Glucose, capillary     Status: None   Collection Time: 09/04/22 11:22 AM  Result Value Ref Range   Glucose-Capillary 75 70 - 99 mg/dL    Comment: Glucose reference range applies only to samples taken after fasting for at least 8 hours.  Glucose, capillary     Status: None   Collection Time: 09/04/22  4:00 PM  Result Value Ref Range   Glucose-Capillary 89 70 - 99 mg/dL    Comment: Glucose reference range applies only to samples taken after fasting for at least 8 hours.  Glucose, capillary     Status: None   Collection Time: 09/04/22  7:25 PM  Result Value Ref Range   Glucose-Capillary 88 70 - 99 mg/dL    Comment: Glucose reference range applies only to samples taken after fasting for at least 8 hours.  CBC     Status: Abnormal   Collection Time: 09/05/22  5:05 AM   Result Value Ref Range   WBC 7.7 4.0 - 10.5 K/uL   RBC 3.35 (L) 3.87 - 5.11 MIL/uL   Hemoglobin 9.8 (L) 12.0 - 15.0 g/dL   HCT 86.5 (L) 78.4 - 69.6 %   MCV 92.5 80.0 - 100.0 fL   MCH 29.3 26.0 - 34.0 pg   MCHC 31.6 30.0 - 36.0 g/dL   RDW 29.5 28.4 - 13.2 %   Platelets 131 (L) 150 - 400 K/uL    Comment: REPEATED TO VERIFY   nRBC 0.0 0.0 - 0.2 %    Comment: Performed at [facility], [address] [place], [place] [address]  Basic metabolic panel     Status: Abnormal   Collection Time: 09/05/22  5:05 AM  Result Value Ref Range   Sodium 137 135 - 145 mmol/L   Potassium 3.9 3.5 - 5.1 mmol/L   Chloride 100 98 - 111 mmol/L   CO2 27 22 - 32 mmol/L   Glucose, Bld 81 70 - 99 mg/dL    Comment: Glucose reference range applies only to samples taken after fasting for at least 8 hours.   BUN 5 (L) 6 - 20 mg/dL   Creatinine, Ser 2.72 0.44 - 1.00 mg/dL   Calcium 8.3 (L) 8.9 - 10.3 mg/dL   GFR, Estimated >  60 >60 mL/min    Comment: (NOTE) Calculated using the CKD-EPI Creatinine Equation (2021)    Anion gap 10 5 - 15    Comment: Performed at [facility], [address], [place], [place] [address]  CBC     Status: Abnormal   Collection Time: 09/06/22  3:41 AM  Result Value Ref Range   WBC 7.7 4.0 - 10.5 K/uL   RBC 3.54 (L) 3.87 - 5.11 MIL/uL   Hemoglobin 10.6 (L) 12.0 - 15.0 g/dL   HCT 62.1 (L) 30.8 - 65.7 %   MCV 92.7 80.0 - 100.0 fL   MCH 29.9 26.0 - 34.0 pg   MCHC 32.3 30.0 - 36.0 g/dL   RDW 84.6 96.2 - 95.2 %   Platelets 191 150 - 400 K/uL   nRBC 0.0 0.0 - 0.2 %    Comment: Performed at [facility], [address], [place], [place] [address]  Basic metabolic panel     Status: Abnormal   Collection Time: 09/06/22  3:41 AM  Result Value Ref Range   Sodium 135 135 - 145 mmol/L   Potassium 3.4 (L) 3.5 - 5.1 mmol/L   Chloride 98 98 - 111 mmol/L   CO2 26 22 - 32 mmol/L   Glucose, Bld 93 70 - 99 mg/dL    Comment: Glucose reference range applies only to samples  taken after fasting for at least 8 hours.   BUN 5 (L) 6 - 20 mg/dL   Creatinine, Ser 4.40 0.44 - 1.00 mg/dL   Calcium 8.1 (L) 8.9 - 10.3 mg/dL   GFR, Estimated >10 >27 mL/min    Comment: (NOTE) Calculated using the CKD-EPI Creatinine Equation (2021)    Anion gap 11 5 - 15    Comment: Performed at [facility], [address], [place], [place] [address]  CBC     Status: Abnormal   Collection Time: 09/07/22  2:37 AM  Result Value Ref Range   WBC 7.7 4.0 - 10.5 K/uL   RBC 4.00 3.87 - 5.11 MIL/uL   Hemoglobin 11.7 (L) 12.0 - 15.0 g/dL   HCT 44.0 (L) 34.7 - 42.5 %   MCV 89.5 80.0 - 100.0 fL   MCH 29.3 26.0 - 34.0 pg   MCHC 32.7 30.0 - 36.0 g/dL   RDW 95.6 38.7 - 56.4 %   Platelets 216 150 - 400 K/uL   nRBC 0.0 0.0 - 0.2 %    Comment: Performed at [facility], [address] [place], [place] [address]  Basic metabolic panel     Status: Abnormal   Collection Time: 09/07/22  2:37 AM  Result Value Ref Range   Sodium 136 135 - 145 mmol/L   Potassium 3.9 3.5 - 5.1 mmol/L   Chloride 96 (L) 98 - 111 mmol/L   CO2 28 22 - 32 mmol/L   Glucose, Bld 94 70 - 99 mg/dL    Comment: Glucose reference range applies only to samples taken after fasting for at least 8 hours.   BUN 5 (L) 6 - 20 mg/dL   Creatinine, Ser 1.88 0.44 - 1.00 mg/dL   Calcium 8.7 (L) 8.9 - 10.3 mg/dL   GFR, Estimated >41 >66 mL/min    Comment: (NOTE) Calculated using the CKD-EPI Creatinine Equation (2021)    Anion gap 12 5 - 15    Comment: Performed at [facility], [address] [place], [place] [address]  Basic metabolic panel  Status: Abnormal   Collection Time: 09/30/22  9:18 PM  Result Value Ref Range   Sodium 134 (L) 135 - 145 mmol/L   Potassium 3.8 3.5 - 5.1 mmol/L   Chloride 103 98 - 111 mmol/L   CO2 18 (L) 22 - 32 mmol/L   Glucose, Bld 97 70 - 99 mg/dL    Comment: Glucose reference range applies only to samples taken after fasting for at least 8 hours.   BUN 7 6 - 20 mg/dL    Creatinine, Ser 6.29 0.44 - 1.00 mg/dL   Calcium 9.4 8.9 - 52.8 mg/dL   GFR, Estimated >41 >32 mL/min    Comment: (NOTE) Calculated using the CKD-EPI Creatinine Equation (2021)    Anion gap 13 5 - 15    Comment: Performed at [facility], [address] [place], [place] [address]  CBC     Status: None   Collection Time: 09/30/22  9:18 PM  Result Value Ref Range   WBC 10.4 4.0 - 10.5 K/uL   RBC 4.97 3.87 - 5.11 MIL/uL   Hemoglobin 14.0 12.0 - 15.0 g/dL   HCT 27.2 53.6 - 64.4 %   MCV 88.3 80.0 - 100.0 fL   MCH 28.2 26.0 - 34.0 pg   MCHC 31.9 30.0 - 36.0 g/dL   RDW 03.4 74.2 - 59.5 %   Platelets 343 150 - 400 K/uL   nRBC 0.0 0.0 - 0.2 %    Comment: Performed at [facility], [address], [place], [place] [address]  Troponin I (High Sensitivity)     Status: None   Collection Time: 09/30/22  9:18 PM  Result Value Ref Range   Troponin I (High Sensitivity) 11 <18 ng/L    Comment: (NOTE) Elevated high sensitivity troponin I (hsTnI) values and significant  changes across serial measurements may suggest ACS but many other  chronic and acute conditions are known to elevate hsTnI results.  Refer to the "Links" section for chest pain algorithms and additional  guidance. Performed at [facility], [address] [place], [place] [address]   Troponin I (High Sensitivity)     Status: None   Collection Time: 10/01/22 12:36 AM  Result Value Ref Range   Troponin I (High Sensitivity) 10 <18 ng/L    Comment: (NOTE) Elevated high sensitivity troponin I (hsTnI) values and significant  changes across serial measurements may suggest ACS but many other  chronic and acute conditions are known to elevate hsTnI results.  Refer to the "Links" section for chest pain algorithms and additional  guidance. Performed at [facility], [address] [place], [place] [address]   Basic metabolic panel     Status: Abnormal   Collection Time: 10/20/22  9:48 PM   Result Value Ref Range   Sodium 135 135 - 145 mmol/L   Potassium 3.3 (L) 3.5 - 5.1 mmol/L   Chloride 107 98 - 111 mmol/L   CO2 18 (L) 22 - 32 mmol/L   Glucose, Bld 136 (H) 70 - 99 mg/dL    Comment: Glucose reference range applies only to samples taken after fasting for at least 8 hours.   BUN 6 6 - 20 mg/dL   Creatinine, Ser 4.16 0.44 - 1.00 mg/dL   Calcium 9.2 8.9 - 60.6 mg/dL   GFR, Estimated >30 >16 mL/min    Comment: (NOTE) Calculated using the CKD-EPI Creatinine Equation (2021)    Anion gap 10 5 - 15    Comment: Performed  at [facility], [address] [place], [place] [address]  CBC     Status: Abnormal   Collection Time: 10/20/22  9:48 PM  Result Value Ref Range   WBC 10.8 (H) 4.0 - 10.5 K/uL   RBC 5.41 (H) 3.87 - 5.11 MIL/uL   Hemoglobin 15.0 12.0 - 15.0 g/dL   HCT 46.9 (H) 62.9 - 52.8 %   MCV 86.0 80.0 - 100.0 fL   MCH 27.7 26.0 - 34.0 pg   MCHC 32.3 30.0 - 36.0 g/dL   RDW 41.3 24.4 - 01.0 %   Platelets 329 150 - 400 K/uL   nRBC 0.0 0.0 - 0.2 %    Comment: Performed at [facility], [address] [place], [place] [address]  Troponin I (High Sensitivity)     Status: None   Collection Time: 10/20/22  9:48 PM  Result Value Ref Range   Troponin I (High Sensitivity) 11 <18 ng/L    Comment: (NOTE) Elevated high sensitivity troponin I (hsTnI) values and significant  changes across serial measurements may suggest ACS but many other  chronic and acute conditions are known to elevate hsTnI results.  Refer to the "Links" section for chest pain algorithms and additional  guidance. Performed at [facility], [address] [place], [place] [address]   Troponin I (High Sensitivity)     Status: None   Collection Time: 10/20/22 11:54 PM  Result Value Ref Range   Troponin I (High Sensitivity) 12 <18 ng/L    Comment: (NOTE) Elevated high sensitivity troponin I (hsTnI) values and significant  changes across serial measurements may suggest  ACS but many other  chronic and acute conditions are known to elevate hsTnI results.  Refer to the "Links" section for chest pain algorithms and additional  guidance. Performed at [facility], [address] [place], [place] [address]        Assessment & Plan:   Problem List Items Addressed This Visit       Active Problems   Gastroesophageal reflux disease   Relevant Orders   Ambulatory referral to Gastroenterology   Calculus of gallbladder with acute on chronic cholecystitis without obstruction - Primary   Relevant Orders   Ambulatory referral to Gastroenterology    Return in about 1 month (around 11/29/2022).   Total time spent: {AMA time spent:29001} minutes  Miki Kins, FNP  10/29/2022   This document may have been prepared by [facility] Voice Recognition software and as such may include unintentional dictation errors.

## 2022-11-17 ENCOUNTER — Encounter: Payer: Self-pay | Admitting: Family

## 2022-11-17 NOTE — Assessment & Plan Note (Signed)
Patient doing better after medication change.  Will continue current regimen.   Reassess at follow up.

## 2022-11-17 NOTE — Assessment & Plan Note (Signed)
Continue current medications.  Patient's GERD has been well controlled.   Recheck at follow up.

## 2022-11-17 NOTE — Assessment & Plan Note (Signed)
Sending referral to GI for her.  Will defer to them for further treatment of this condition.

## 2022-11-17 NOTE — Assessment & Plan Note (Signed)
 Patient is seen by Psychiatry, who manage this condition.  She is well controlled with current therapy.   Will defer to them for further changes to plan of care.

## 2022-11-26 ENCOUNTER — Other Ambulatory Visit: Payer: Self-pay | Admitting: Family

## 2022-11-26 ENCOUNTER — Telehealth: Payer: Self-pay | Admitting: Family

## 2022-11-26 NOTE — Telephone Encounter (Signed)
Patient left VM wanting [place] to contact her cardiologist and find out what labs they need drawn so that we can do them here. She also wants [place] to check her thyroid labs.

## 2022-11-29 ENCOUNTER — Ambulatory Visit: Payer: MEDICAID | Attending: Medical | Admitting: Medical

## 2022-11-29 ENCOUNTER — Encounter: Payer: Self-pay | Admitting: Medical

## 2022-11-29 VITALS — BP 109/80 | HR 117 | Ht 67.0 in | Wt 227.6 lb

## 2022-11-29 DIAGNOSIS — R000 Tachycardia, unspecified: Secondary | ICD-10-CM

## 2022-11-29 DIAGNOSIS — Z951 Presence of aortocoronary bypass graft: Secondary | ICD-10-CM | POA: Diagnosis not present

## 2022-11-29 DIAGNOSIS — I2510 Atherosclerotic heart disease of native coronary artery without angina pectoris: Secondary | ICD-10-CM

## 2022-11-29 DIAGNOSIS — I5022 Chronic systolic (congestive) heart failure: Secondary | ICD-10-CM

## 2022-11-29 DIAGNOSIS — I214 Non-ST elevation (NSTEMI) myocardial infarction: Secondary | ICD-10-CM | POA: Diagnosis not present

## 2022-11-29 DIAGNOSIS — Z79899 Other long term (current) drug therapy: Secondary | ICD-10-CM

## 2022-11-29 DIAGNOSIS — I255 Ischemic cardiomyopathy: Secondary | ICD-10-CM

## 2022-11-29 MED ORDER — METOPROLOL SUCCINATE ER 25 MG PO TB24: 25.0000 mg | ORAL_TABLET | Freq: Every day | ORAL | 3 refills | Status: DC

## 2022-11-29 MED ORDER — METHIMAZOLE 10 MG PO TABS: 10.0000 mg | ORAL_TABLET | Freq: Every day | ORAL | 1 refills | Status: DC

## 2022-11-29 MED ORDER — SPIRONOLACTONE 25 MG PO TABS: 12.5000 mg | ORAL_TABLET | Freq: Every day | ORAL | 3 refills | Status: DC

## 2022-11-29 NOTE — Patient Instructions (Addendum)
Medication Instructions:  Your physician recommends the following medication changes.  START TAKING: Spironolactone 12.5 mg by mouth daily  INCREASE: Metoprolol to 25 mg by mouth daily  *If you need a refill on your cardiac medications before your next appointment, please call your pharmacy*   Lab Work: Your provider would like for you to return in 2 weeks to have the following labs drawn: (BMP).   Please go to [facility] [address] [address], [place] [address] You do not need an appointment.  They are open from 7:30 am-4 pm.  Lunch from 1:00 pm- 2:00 pm You will not need to be fasting.   Testing/Procedures: No test ordered today    Follow-Up: At [facility], you and your health needs are our priority.  As part of our continuing mission to provide you with exceptional heart care, we have created designated Provider Care Teams.  These Care Teams include your primary Cardiologist (physician) and Advanced Practice Providers (APPs -  Physician Assistants and Nurse Practitioners) who all work together to provide you with the care you need, when you need it.  We recommend signing up for the patient portal called "MyChart".  Sign up information is provided on this After Visit Summary.  MyChart is used to connect with patients for Virtual Visits (Telemedicine).  Patients are able to view lab/test results, encounter notes, upcoming appointments, etc.  Non-urgent messages can be sent to your provider as well.   To learn more about what you can do with MyChart, go to [email or web address].    Your next appointment:   2 month(s)  Provider:   Cadence Furth, PA-C {If Card or EP not listed click to update   DO NOT delete brackets or

## 2022-11-29 NOTE — Progress Notes (Unsigned)
Cardiology Office Note:    Date:  11/30/2022   ID:  Elizabeth Spencer, DOB [birth year], MRN [id number]  PCP:  Miki Kins, FNP  [facility] Cardiologist:  None  [facility] Electrophysiologist:  None   Referring MD: Miki Kins, FNP   Chief Complaint: 1 month follow-up  History of Present Illness:    Elizabeth Spencer is a 37 y.o. 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   Patient was seen in May 2024 for chest pain during an admission.  She had recently been diagnosed with factor V Leiden and was going to be started on a blood thinner.  High-sensitivity troponin went up to 1163.  UDS + cocaine.  She was started on IV heparin and admitted.  Patient underwent heart cath that showed severe single-vessel CAD with hazy eccentric 90% ostial LAD stenosis concerning for acute plaque rupture, tubular 40% proximal RCA stenosis, large circumflex without significant disease.  EF noted to be 30 to 35%.  Follow-up echo confirmed LVEF of 30 to 35%, mild LVH, grade 1 diastolic dysfunction, mild MR.  Patient was transferred to [facility] for CABG consideration.  She underwent CABG x 1 utilizing LIMA to LAD.  Post-op period was uncomplicated. Patient was discharged on 09/03/2022.  Patient was last seen 10/12/2022 and had been off of her medications for 2 to 3 days.  She reported a rash around the insertion site.  She denied any chest pain.  Lasix, potassium, aspirin, Plavix and Lipitor were refilled.  Heart rate was high and she was started on Toprol.  She had appointment with CT surgery the next day.  She went to the ER 10/20/22 reporting left arm pain. The patient closed the trunk of a car and had severe chest, shoulder and arm pain. Work-up int he ER was normal.   Today,  the patient reports heart rate is still high and causing her to not sleep. She also reports anxiety which she feels is not being treated right. She is still having MSK pain in her chest and shoulder.  Patient has many noncardiac issues that were discussed in detail today.  In review, it appears patient has hypothyroidism with abnormal TSH and free T4 in May 2024. She is on methimazole, but says she has been taking a lower dose.  Reports she was discharged with a 5 instead of 10mg . She recently missed her met with endocrinology.  She was encouraged to follow-up with them.  She denies recent cocaine use, but drinks alcohol occasionally.   Past Medical History:  Diagnosis Date   Aneurysm (HCC) 01/04/2014   Arthritis    Asthma    WELL CONTROLLED   Brain aneurysm 2007   NEUROLOGY NOTE DOES NOT MENTION ANEURYSM BUT PT STATES SHE DID NOT HAVE TO HAVE SURGERY   Complication of anesthesia    FOR 1 C-SECTION PT WAS ITCHING AND VERY RED ON HER FACE   Cough, persistent 01/27/2016   Dermatitis due to sunburn 11/08/2013   Family history of adverse reaction to anesthesia    brother, neice and nephew got red in face with hives   Gallstones    GERD (gastroesophageal reflux disease)    Gonorrhea 06/20/2020   Headache    CHRONIC HEADACHES   History of chronic cough  DRY   Loss of memory 05/30/2017   Perforation of left tympanic membrane 05/17/2013   Pneumonia    PTSD (post-traumatic stress disorder)    PTSD (post-traumatic stress disorder)    Thyroid condition    PT WAS JUST TOLD ON 08-25-17 THAT SHE HAS A THYROID PROBLEM AND IS GOING TO F/U WITH ENDOCRINOLOGIST IN 2 WEEKS   Trichomonas vaginalis (TV) infection 06/20/2020    Past Surgical History:  Procedure Laterality Date   ABDOMINAL HYSTERECTOMY     CESAREAN SECTION     x3   CORONARY ARTERY BYPASS GRAFT N/A 09/02/2022   Procedure: CORONARY ARTERY BYPASS GRAFTING (CABG) X 1 USING LEFT INTERNAL MAMMARY ARTERY;  Surgeon: Alleen Borne, MD;   Location: [facility] OR;  Service: Open Heart Surgery;  Laterality: N/A;   CYSTOSCOPY N/A 09/29/2017   Procedure: CYSTOSCOPY;  Surgeon: Vena Austria, MD;  Location: [facility] ORS;  Service: Gynecology;  Laterality: N/A;   CYSTOSCOPY N/A 11/05/2017   Procedure: CYSTOSCOPY;  Surgeon: Vena Austria, MD;  Location: [facility] ORS;  Service: Gynecology;  Laterality: N/A;   HYSTEROSCOPY WITH D & C N/A 09/01/2017   Procedure: DILATATION AND CURETTAGE /HYSTEROSCOPY;  Surgeon: Vena Austria, MD;  Location: [facility] ORS;  Service: Gynecology;  Laterality: N/A;   LAPAROSCOPY N/A 09/01/2017   Procedure: LAPAROSCOPY OPERATIVE with biopsy;  Surgeon: Vena Austria, MD;  Location: [facility] ORS;  Service: Gynecology;  Laterality: N/A;   LAPAROSCOPY N/A 11/05/2017   Procedure: LAPAROSCOPY DIAGNOSTIC;  Surgeon: Vena Austria, MD;  Location: [facility] ORS;  Service: Gynecology;  Laterality: N/A;   LEFT HEART CATH AND CORONARY ANGIOGRAPHY N/A 09/01/2022   Procedure: LEFT HEART CATH AND CORONARY ANGIOGRAPHY;  Surgeon: Yvonne Kendall, MD;  Location: [facility] INVASIVE CV LAB;  Service: Cardiovascular;  Laterality: N/A;   REPAIR VAGINAL CUFF N/A 11/05/2017   Procedure: REPAIR VAGINAL CUFF;  Surgeon: Vena Austria, MD;  Location: [facility] ORS;  Service: Gynecology;  Laterality: N/A;   TEE WITHOUT CARDIOVERSION N/A 09/02/2022   Procedure: TRANSESOPHAGEAL ECHOCARDIOGRAM;  Surgeon: Alleen Borne, MD;  Location[facility] OR;  Service: Open Heart Surgery;  Laterality: N/A;   TOTAL LAPAROSCOPIC HYSTERECTOMY WITH SALPINGECTOMY Bilateral 09/29/2017   Procedure: TOTAL LAPAROSCOPIC HYSTERECTOMY WITH SALPINGECTOMY;  Surgeon: Vena Austria, MD;  Location: [facility] ORS;  Service: Gynecology;  Laterality: Bilateral;    Current Medications: Current Meds  Medication Sig   albuterol (VENTOLIN HFA) 108 (90 Base) MCG/ACT inhaler Inhale 2 puffs into the lungs every 6 (six) hours as needed for wheezing or shortness of breath.   ALPRAZolam (XANAX XR) 0.5 MG 24 hr  tablet Take 1 tablet (0.5 mg total) by mouth 2 (two) times daily as needed for anxiety or sleep.   aspirin 81 MG chewable tablet Chew 1 tablet (81 mg total) by mouth daily.   atorvastatin (LIPITOR) 40 MG tablet Take 1 tablet (40 mg total) by mouth daily.   cetirizine (ZYRTEC) 10 MG tablet Take 1 tablet (10 mg total) by mouth daily.   clopidogrel (PLAVIX) 75 MG tablet Take 1 tablet (75 mg total) by mouth daily.   colchicine 0.6 MG tablet Take 1 tablet (0.6 mg total) by mouth daily.   dapagliflozin propanediol (FARXIGA) 10 MG TABS tablet Take 1 tablet (10 mg total) by mouth daily before breakfast.   EPIPEN 2-PAK 0.3 MG/0.3ML SOAJ injection INJECT INTO THIGH MUSCLE THROUGH CLOTHES AS NEEDED SEVERE ALLERGIC REACTION   fluticasone (FLONASE) 50 MCG/ACT nasal spray 1 spray by Each Nare route daily.   HYDROcodone-acetaminophen (NORCO/VICODIN) 5-325 MG tablet  Take 1 tablet by mouth every 6 (six) hours as needed for moderate pain.   hydrOXYzine (ATARAX) 25 MG tablet Take 1 tablet (25 mg total) by mouth 3 (three) times daily as needed for anxiety.   lidocaine (LIDODERM) 5 % Place 1 patch onto the skin every 12 (twelve) hours. Remove & Discard patch within 12 hours or as directed by MD   [EXPIRED] metroNIDAZOLE (FLAGYL) 500 MG tablet Take 1 tablet (500 mg total) by mouth 3 (three) times daily for 14 days.   montelukast (SINGULAIR) 10 MG tablet Take 1 tablet (10 mg total) by mouth daily.   Multiple Vitamins-Minerals (CENTRUM ADULTS PO) Take 1 tablet by mouth at bedtime.    pantoprazole (PROTONIX) 40 MG tablet Take 1 tablet (40 mg total) by mouth daily.   spironolactone (ALDACTONE) 25 MG tablet Take 0.5 tablets (12.5 mg total) by mouth daily.   [DISCONTINUED] methimazole (TAPAZOLE) 10 MG tablet Take 10 mg by mouth daily.   [DISCONTINUED] metoprolol succinate (TOPROL XL) 25 MG 24 hr tablet Take 0.5 tablets (12.5 mg total) by mouth daily.     Allergies:   Azithromycin, Clindamycin, Codeine, Meloxicam, Neurontin  [gabapentin], Penicillins, Clindamycin/lincomycin, Other, Diphenhydramine hcl, Doxycycline, and Egg-derived products   Social History   Socioeconomic History   Marital status: Single    Spouse name: Not on file   Number of children: Not on file   Years of education: Not on file   Highest education level: Not on file  Occupational History   Not on file  Tobacco Use   Smoking status: Every Day    Current packs/day: 0.50    Average packs/day: 0.5 packs/day for 18.0 years (9.0 ttl pk-yrs)    Types: Cigarettes   Smokeless tobacco: Never  Vaping Use   Vaping status: Never Used  Substance and Sexual Activity   Alcohol use: Yes    Alcohol/week: 0.0 standard drinks of alcohol    Comment: occasionally   Drug use: Not Currently    Types: Marijuana, Cocaine   Sexual activity: Yes    Partners: Male    Birth control/protection: Surgical    Comment: Hysterectomy  Other Topics Concern   Not on file  Social History Narrative   Not on file   Social Determinants of Health   Financial Resource Strain: Not on file  Food Insecurity: No Food Insecurity (09/01/2022)   Hunger Vital Sign    Worried About Running Out of Food in the Last Year: Never true    Ran Out of Food in the Last Year: Never true  Transportation Needs: No Transportation Needs (09/01/2022)   PRAPARE - Administrator, Civil Service (Medical): No    Lack of Transportation (Non-Medical): No  Physical Activity: Not on file  Stress: Not on file  Social Connections: Not on file     Family History: The patient's family history includes Alcohol abuse in her father and mother; COPD in her father; Deep vein thrombosis in her mother; Emphysema in her father; Heart disease in her father; Hypertension in her mother.  ROS:   Please see the history of present illness.     All other systems reviewed and are negative.  EKGs/Labs/Other Studies Reviewed:    The following studies were reviewed today:  Intraoperative TEE  08/2022 Complications: No known complications during this procedure.  POST-OP IMPRESSIONS  _ Left Ventricle: The left ventricle is unchanged from pre-bypass. EF  25-30%  _ Right Ventricle: The right ventricle appears unchanged from pre-bypass.  _ Aorta: The aorta appears unchanged from pre-bypass.  _ Left Atrium: The left atrium appears unchanged from pre-bypass.  _ Left Atrial Appendage: The left atrial appendage appears unchanged from  pre-bypass.  _ Aortic Valve: The aortic valve appears unchanged from pre-bypass.  _ Mitral Valve: The mitral valve appears unchanged from pre-bypass.  _ Tricuspid Valve: The tricuspid valve appears unchanged from pre-bypass.  _ Pulmonic Valve: The pulmonic valve appears unchanged from pre-bypass.  _ Interatrial Septum: The interatrial septum appears unchanged from  pre-bypass.  _ Interventricular Septum: The interventricular septum appears unchanged  from  pre-bypass.  _ Pericardium: The pericardium appears unchanged from pre-bypass.   Echo 08/2022  1. Left ventricular ejection fraction, by estimation, is 30 to 35%. The  left ventricle has moderate to severely decreased function. The left  ventricle demonstrates global hypokinesis. There is mild left ventricular  hypertrophy. Left ventricular  diastolic parameters are consistent with Grade I diastolic dysfunction  (impaired relaxation).   2. Right ventricular systolic function is normal. The right ventricular  size is not well visualized.   3. The mitral valve is normal in structure. Mild mitral valve  regurgitation.   4. The aortic valve was not well visualized. Aortic valve regurgitation  is not visualized.   LHC 08/2022 Conclusions: Severe single-vessel coronary artery disease, with hazy and eccentric 90% ostial LAD stenosis concerning for acute plaque rupture.  Tubular 40% proximal RCA stenosis is also present.  Large LCx without significant disease. Moderately to severely reduced left  ventricular systolic function (LVEF 30-35%) with anterior wall hypokinesis/akinesis.  LVEF 30-35%. Moderately elevated left ventricular filling pressure (LVEDP 25 mmHg).   Recommendations: Critical ostial LAD disease not well-suited for PCI given risk for compromise of LCx and D1.  Images reviewed with interventional cardiology and cardiac surgery teams at [facility]; we will arrange for transfer to [facility] with plans for CABG tomorrow. Restart heparin infusion 2 hours after TR band removal. Titrate NTG infusion for relief of chest pain.  IABP deferred due to patient's inability to lie flat and remain still. Aggressive secondary prevention of coronary artery disease. Obtain echocardiogram.   Yvonne Kendall, MD [facility]    EKG:  EKG is ordered today.  The ekg ordered today demonstrates NSR, 99bpm, aVL, PVCs  Recent Labs: 09/01/2022: ALT 34; TSH 0.018 09/03/2022: Magnesium 1.9 10/20/2022: BUN 6; Creatinine, Ser 0.67; Hemoglobin 15.0; Platelets 329; Potassium 3.3; Sodium 135  Recent Lipid Panel    Component Value Date/Time   CHOL 135 08/31/2022 2200   CHOL 191 06/17/2022 1533   TRIG 110 08/31/2022 2200   HDL 46 08/31/2022 2200   HDL 45 06/17/2022 1533   CHOLHDL 2.9 08/31/2022 2200   VLDL 22 08/31/2022 2200   LDLCALC 67 08/31/2022 2200   LDLCALC 108 (H) 06/17/2022 1533    Physical Exam:    VS:  BP 109/80 (BP Location: Left Arm, Patient Position: Sitting, Cuff Size: Large)   Pulse (!) 117   Ht 5\' 7"  (1.702 m)   Wt 227 lb 9.6 oz (103.2 kg)   LMP 08/22/2017 Comment: says she just spots.  SpO2 98%   BMI 35.65 kg/m     Wt Readings from Last 3 Encounters:  11/29/22 227 lb 9.6 oz (103.2 kg)  10/29/22 220 lb 12.8 oz (100.2 kg)  10/20/22 221 lb 12.5 oz (100.6 kg)     GEN:  Well nourished, well developed in no acute distress HEENT: Normal NECK: No JVD; No carotid bruits LYMPHATICS: No lymphadenopathy  CARDIAC: RRR, no murmurs, rubs, gallops RESPIRATORY:  Clear to  auscultation without rales, wheezing or rhonchi  ABDOMEN: Soft, non-tender, non-distended MUSCULOSKELETAL:  No edema; No deformity  SKIN: Warm and dry NEUROLOGIC:  Alert and oriented x 3 PSYCHIATRIC:  Normal affect   ASSESSMENT:    1. Coronary artery disease involving native coronary artery of native heart without angina pectoris   2. S/P CABG x 1   3. NSTEMI (non-ST elevated myocardial infarction) (HCC)   4. Medication management   5. Chronic systolic heart failure (HCC)   6. Ischemic cardiomyopathy   7. Sinus tachycardia    PLAN:    In order of problems listed above:  CAD s/p CABG x1 NSTEMI Patient reports she used her left hand to close the trunk of a car and has subsequent severe left shoulder chest pain and arm pain.  Apparently, patient has been using her arm since her surgery, although she was instructed not to.  She went to the ER for pain and was ruled out for ACS.  Since then, she has had persistent musculoskeletal pain. I recommended patient restrain from using her arms or lifting heavy weights.  No further ischemic workup at this time.  Continue aspirin, Plavix, Lipitor, and Toprol.  HFrEF Ischemic cardiomyopathy Echo 08/2027 showed LVEF 30 to 35%. Patient is euvolemic on exam.  Continue Lasix, potassium, Toprol, Farxiga.  I will start spironolactone 12.5 mg daily, BMET in 2 weeks.  We can order repeat echo at follow-up.  Sinus tachycardia Heart rate is persistently high despite increase in Toprol.  Review of EKGs show sinus tachycardia.  Upon further chart review, it seems she has uncontrolled hyperthyroidism with abnormal FT4 and TSH in May 2024.  She is on methimazole, however she reports she has been taking a lower dose than before. She had an appointment with endocrinology that she missed.  I encouraged patient to be seen by endocrinology as abnormal thyroid function is likely contributing to heart rate.  Continue Toprol 25 mg daily, I will refill this.  Cocaine  use Patient denies recent cocaine use.  Factor V Leiden Continue aspirin and Plavix  Disposition: Follow up in 2 month(s) with MD1APP   Signed, Elowen Debruyn David Stall, PA-C  11/30/2022 9:07 AM    [facility]

## 2022-11-30 ENCOUNTER — Ambulatory Visit: Payer: MEDICAID | Admitting: Family

## 2022-12-10 ENCOUNTER — Emergency Department: Payer: MEDICAID

## 2022-12-10 ENCOUNTER — Inpatient Hospital Stay
Admission: EM | Admit: 2022-12-10 | Discharge: 2022-12-13 | DRG: 418 | Disposition: A | Discharge status: Home / Self Care | Payer: MEDICAID | Attending: Internal Medicine | Admitting: Internal Medicine

## 2022-12-10 ENCOUNTER — Other Ambulatory Visit: Payer: Self-pay

## 2022-12-10 DIAGNOSIS — F319 Bipolar disorder, unspecified: Secondary | ICD-10-CM | POA: Diagnosis present

## 2022-12-10 DIAGNOSIS — J45909 Unspecified asthma, uncomplicated: Secondary | ICD-10-CM | POA: Diagnosis present

## 2022-12-10 DIAGNOSIS — F4310 Post-traumatic stress disorder, unspecified: Secondary | ICD-10-CM | POA: Diagnosis present

## 2022-12-10 DIAGNOSIS — K8050 Calculus of bile duct without cholangitis or cholecystitis without obstruction: Secondary | ICD-10-CM

## 2022-12-10 DIAGNOSIS — K810 Acute cholecystitis: Secondary | ICD-10-CM

## 2022-12-10 DIAGNOSIS — F32A Depression, unspecified: Secondary | ICD-10-CM | POA: Insufficient documentation

## 2022-12-10 DIAGNOSIS — R1010 Upper abdominal pain, unspecified: Principal | ICD-10-CM | POA: Diagnosis present

## 2022-12-10 DIAGNOSIS — Z9102 Food additives allergy status: Secondary | ICD-10-CM

## 2022-12-10 DIAGNOSIS — Z79899 Other long term (current) drug therapy: Secondary | ICD-10-CM

## 2022-12-10 DIAGNOSIS — E0590 Thyrotoxicosis, unspecified without thyrotoxic crisis or storm: Secondary | ICD-10-CM | POA: Diagnosis present

## 2022-12-10 DIAGNOSIS — I255 Ischemic cardiomyopathy: Secondary | ICD-10-CM | POA: Diagnosis present

## 2022-12-10 DIAGNOSIS — K219 Gastro-esophageal reflux disease without esophagitis: Secondary | ICD-10-CM | POA: Diagnosis present

## 2022-12-10 DIAGNOSIS — R52 Pain, unspecified: Secondary | ICD-10-CM

## 2022-12-10 DIAGNOSIS — Z8249 Family history of ischemic heart disease and other diseases of the circulatory system: Secondary | ICD-10-CM

## 2022-12-10 DIAGNOSIS — E782 Mixed hyperlipidemia: Secondary | ICD-10-CM | POA: Diagnosis present

## 2022-12-10 DIAGNOSIS — K8020 Calculus of gallbladder without cholecystitis without obstruction: Secondary | ICD-10-CM | POA: Diagnosis present

## 2022-12-10 DIAGNOSIS — K8012 Calculus of gallbladder with acute and chronic cholecystitis without obstruction: Principal | ICD-10-CM | POA: Diagnosis present

## 2022-12-10 DIAGNOSIS — Z880 Allergy status to penicillin: Secondary | ICD-10-CM

## 2022-12-10 DIAGNOSIS — D6851 Activated protein C resistance: Secondary | ICD-10-CM | POA: Diagnosis present

## 2022-12-10 DIAGNOSIS — G4733 Obstructive sleep apnea (adult) (pediatric): Secondary | ICD-10-CM | POA: Diagnosis present

## 2022-12-10 DIAGNOSIS — F419 Anxiety disorder, unspecified: Secondary | ICD-10-CM | POA: Diagnosis present

## 2022-12-10 DIAGNOSIS — Z90710 Acquired absence of both cervix and uterus: Secondary | ICD-10-CM

## 2022-12-10 DIAGNOSIS — I5022 Chronic systolic (congestive) heart failure: Secondary | ICD-10-CM | POA: Diagnosis present

## 2022-12-10 DIAGNOSIS — I252 Old myocardial infarction: Secondary | ICD-10-CM

## 2022-12-10 DIAGNOSIS — Z7902 Long term (current) use of antithrombotics/antiplatelets: Secondary | ICD-10-CM

## 2022-12-10 DIAGNOSIS — Z7982 Long term (current) use of aspirin: Secondary | ICD-10-CM

## 2022-12-10 DIAGNOSIS — I2510 Atherosclerotic heart disease of native coronary artery without angina pectoris: Secondary | ICD-10-CM | POA: Diagnosis present

## 2022-12-10 DIAGNOSIS — Z91012 Allergy to eggs: Secondary | ICD-10-CM

## 2022-12-10 DIAGNOSIS — E785 Hyperlipidemia, unspecified: Secondary | ICD-10-CM | POA: Insufficient documentation

## 2022-12-10 DIAGNOSIS — F17210 Nicotine dependence, cigarettes, uncomplicated: Secondary | ICD-10-CM | POA: Diagnosis present

## 2022-12-10 DIAGNOSIS — Z881 Allergy status to other antibiotic agents status: Secondary | ICD-10-CM

## 2022-12-10 DIAGNOSIS — Z885 Allergy status to narcotic agent status: Secondary | ICD-10-CM

## 2022-12-10 DIAGNOSIS — Z951 Presence of aortocoronary bypass graft: Secondary | ICD-10-CM

## 2022-12-10 LAB — COMPREHENSIVE METABOLIC PANEL
ALT: 16 U/L (ref 0–44)
AST: 29 U/L (ref 15–41)
Albumin: 3.7 g/dL (ref 3.5–5.0)
Alkaline Phosphatase: 114 U/L (ref 38–126)
Anion gap: 14 (ref 5–15)
BUN: 10 mg/dL (ref 6–20)
CO2: 20 mmol/L — ABNORMAL LOW (ref 22–32)
Calcium: 9.5 mg/dL (ref 8.9–10.3)
Chloride: 101 mmol/L (ref 98–111)
Creatinine, Ser: 0.55 mg/dL (ref 0.44–1.00)
GFR, Estimated: >60 mL/min (ref >60)
Glucose, Bld: 142 mg/dL — ABNORMAL HIGH (ref 70–99)
Potassium: 3.7 mmol/L (ref 3.5–5.1)
Sodium: 135 mmol/L (ref 135–145)
Total Bilirubin: 0.6 mg/dL (ref 0.3–1.2)
Total Protein: 7.5 g/dL (ref 6.5–8.1)

## 2022-12-10 LAB — LIPASE, BLOOD: Lipase: 23 U/L (ref 11–51)

## 2022-12-10 MED ORDER — ONDANSETRON HCL 4 MG/2ML IJ SOLN
4.0000 mg | Freq: Once | INTRAMUSCULAR | Status: AC
  Administered 2022-12-11: 4 mg via INTRAVENOUS
  Filled 2022-12-10: qty 2

## 2022-12-10 MED ORDER — SODIUM CHLORIDE 0.9 % IV BOLUS
1000.0000 mL | Freq: Once | INTRAVENOUS | Status: AC
  Administered 2022-12-11: 1000 mL via INTRAVENOUS

## 2022-12-10 MED ORDER — HYDROMORPHONE HCL 1 MG/ML IJ SOLN
1.0000 mg | Freq: Once | INTRAMUSCULAR | Status: AC
  Administered 2022-12-11: 1 mg via INTRAVENOUS
  Filled 2022-12-10: qty 1

## 2022-12-10 NOTE — ED Notes (Signed)
Pt unwilling to sit still during blood draw, and was rocking back and forth during blood draw. While obtaining lavander, and after obtaining the light green tube, pt pulled back her hand removing the butterfly needle from her hand. Pt refused to allow EDT to straight stick for another lavander tube, so current lavander tube sent for analysis.

## 2022-12-10 NOTE — ED Provider Notes (Signed)
[facility] Provider Note    Event Date/Time   First MD Initiated Contact with Patient 12/10/22 2334     (approximate)   History   Abdominal Pain   HPI {Remember to add pertinent medical, surgical, social, and/or OB history to HPI:1} Elizabeth Spencer is a 37 y.o. female  ***       Past Medical History   Past Medical History:  Diagnosis Date   Aneurysm (HCC) 01/04/2014   Arthritis    Asthma    WELL CONTROLLED   Brain aneurysm 2007   NEUROLOGY NOTE DOES NOT MENTION ANEURYSM BUT PT STATES SHE DID NOT HAVE TO HAVE SURGERY   Complication of anesthesia    FOR 1 C-SECTION PT WAS ITCHING AND VERY RED ON HER FACE   Cough, persistent 01/27/2016   Dermatitis due to sunburn 11/08/2013   Family history of adverse reaction to anesthesia    brother, neice and nephew got red in face with hives   Gallstones    GERD (gastroesophageal reflux disease)    Gonorrhea 06/20/2020   Headache    CHRONIC HEADACHES   History of chronic cough    DRY   Loss of memory 05/30/2017   Perforation of left tympanic membrane 05/17/2013   Pneumonia    PTSD (post-traumatic stress disorder)    PTSD (post-traumatic stress disorder)    Thyroid condition    PT WAS JUST TOLD ON 08-25-17 THAT SHE HAS A THYROID PROBLEM AND IS GOING TO F/U WITH ENDOCRINOLOGIST IN 2 WEEKS   Trichomonas vaginalis (TV) infection 06/20/2020     Active Problem List   Patient Active Problem List   Diagnosis Date Noted   Obstructive sleep apnea 10/24/2022   Calculus of gallbladder with acute on chronic cholecystitis without obstruction 10/24/2022   Insomnia 10/24/2022   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   NSTEMI (non-ST elevated myocardial infarction) (HCC) 09/01/2022   Chest pain 08/31/2022   Gastroesophageal reflux disease 12/27/2019   Multinodular goiter 08/08/2018   Vaginal cuff dehiscence 11/05/2017   S/P laparoscopic hysterectomy 09/29/2017   Chronic tension-type headache, not intractable 05/30/2017    Migraine without aura and without status migrainosus, not intractable 05/30/2017   Arthropathy of lumbar facet joint 04/08/2017   Anxiety 11/08/2013   Obesity 11/08/2013   Bipolar affective disorder (HCC) 05/17/2013   Depression 08/18/2012     Past Surgical History   Past Surgical History:  Procedure Laterality Date   ABDOMINAL HYSTERECTOMY     CESAREAN SECTION     x3   CORONARY ARTERY BYPASS GRAFT N/A 09/02/2022   Procedure: CORONARY ARTERY BYPASS GRAFTING (CABG) X 1 USING LEFT INTERNAL MAMMARY ARTERY;  Surgeon: Alleen Borne, MD;  Location: [facility] OR;  Service: Open Heart Surgery;  Laterality: N/A;   CYSTOSCOPY N/A 09/29/2017   Procedure: CYSTOSCOPY;  Surgeon: Vena Austria, MD;  Location: [facility];  Service: Gynecology;  Laterality: N/A;   CYSTOSCOPY N/A 11/05/2017   Procedure: CYSTOSCOPY;  Surgeon: Vena Austria, MD;  Location: [facility];  Service: Gynecology;  Laterality: N/A;   HYSTEROSCOPY WITH D & C N/A 09/01/2017   Procedure: DILATATION AND CURETTAGE /HYSTEROSCOPY;  Surgeon: Vena Austria, MD;  Location: [facility];  Service: Gynecology;  Laterality: N/A;   LAPAROSCOPY N/A 09/01/2017   Procedure: LAPAROSCOPY OPERATIVE with biopsy;  Surgeon: Vena Austria, MD;  Location: [facility];  Service: Gynecology;  Laterality: N/A;   LAPAROSCOPY N/A 11/05/2017   Procedure: LAPAROSCOPY DIAGNOSTIC;  Surgeon: Vena Austria, MD;  Location: [facility];  Service: Gynecology;  Laterality: N/A;   LEFT HEART CATH AND CORONARY ANGIOGRAPHY N/A 09/01/2022   Procedure: LEFT HEART CATH AND CORONARY ANGIOGRAPHY;  Surgeon: Yvonne Kendall, MD;  Location: [facility];  Service: Cardiovascular;  Laterality: N/A;   REPAIR VAGINAL CUFF N/A 11/05/2017   Procedure: REPAIR VAGINAL CUFF;  Surgeon: Vena Austria, MD;  Location: [facility];  Service: Gynecology;  Laterality: N/A;   TEE WITHOUT CARDIOVERSION N/A 09/02/2022   Procedure: TRANSESOPHAGEAL ECHOCARDIOGRAM;  Surgeon: Alleen Borne, MD;   Location: [facility] OR;  Service: Open Heart Surgery;  Laterality: N/A;   TOTAL LAPAROSCOPIC HYSTERECTOMY WITH SALPINGECTOMY Bilateral 09/29/2017   Procedure: TOTAL LAPAROSCOPIC HYSTERECTOMY WITH SALPINGECTOMY;  Surgeon: Vena Austria, MD;  Location: [facility];  Service: Gynecology;  Laterality: Bilateral;     Home Medications   Prior to Admission medications   Medication Sig Start Date End Date Taking? Authorizing Provider  albuterol (VENTOLIN HFA) 108 (90 Base) MCG/ACT inhaler Inhale 2 puffs into the lungs every 6 (six) hours as needed for wheezing or shortness of breath. 06/17/22   Miki Kins, FNP  ALPRAZolam (XANAX XR) 0.5 MG 24 hr tablet Take 1 tablet (0.5 mg total) by mouth 2 (two) times daily as needed for anxiety or sleep. 10/21/22   Miki Kins, FNP  aspirin 81 MG chewable tablet Chew 1 tablet (81 mg total) by mouth daily. 10/12/22   Furth, Cadence H, PA-C  atorvastatin (LIPITOR) 40 MG tablet Take 1 tablet (40 mg total) by mouth daily. 10/13/22   Furth, Cadence H, PA-C  cetirizine (ZYRTEC) 10 MG tablet Take 1 tablet (10 mg total) by mouth daily. 06/17/22   Miki Kins, FNP  clopidogrel (PLAVIX) 75 MG tablet Take 1 tablet (75 mg total) by mouth daily. 10/12/22   Furth, Cadence H, PA-C  colchicine 0.6 MG tablet Take 1 tablet (0.6 mg total) by mouth daily. 09/08/22   Stehler, Oren Bracket, PA-C  dapagliflozin propanediol (FARXIGA) 10 MG TABS tablet Take 1 tablet (10 mg total) by mouth daily before breakfast. 10/12/22   Furth, Cadence H, PA-C  EPIPEN 2-PAK 0.3 MG/0.3ML SOAJ injection INJECT INTO THIGH MUSCLE THROUGH CLOTHES AS NEEDED SEVERE ALLERGIC REACTION 06/17/22   Miki Kins, FNP  fluticasone (FLONASE) 50 MCG/ACT nasal spray 1 spray by Each Nare route daily. 06/17/22   Miki Kins, FNP  HYDROcodone-acetaminophen (NORCO/VICODIN) 5-325 MG tablet Take 1 tablet by mouth every 6 (six) hours as needed for moderate pain. 11/15/22   Miki Kins, FNP  hydrOXYzine (ATARAX) 25 MG  tablet Take 1 tablet (25 mg total) by mouth 3 (three) times daily as needed for anxiety. 10/01/22   Delton Prairie, MD  lidocaine (LIDODERM) 5 % Place 1 patch onto the skin every 12 (twelve) hours. Remove & Discard patch within 12 hours or as directed by MD 10/21/22 10/21/23  Delton Prairie, MD  methimazole (TAPAZOLE) 10 MG tablet Take 1 tablet (10 mg total) by mouth daily. 11/29/22   Miki Kins, FNP  metoprolol succinate (TOPROL XL) 25 MG 24 hr tablet Take 1 tablet (25 mg total) by mouth daily. 11/29/22   Furth, Cadence H, PA-C  montelukast (SINGULAIR) 10 MG tablet Take 1 tablet (10 mg total) by mouth daily. 06/17/22   Miki Kins, FNP  Multiple Vitamins-Minerals (CENTRUM ADULTS PO) Take 1 tablet by mouth at bedtime.     [provider]  nicotine (NICODERM CQ - DOSED IN MG/24 HOURS) 21 mg/24hr patch Place 1 patch (21 mg  total) onto the skin daily. Patient not taking: Reported on 11/29/2022 10/13/22   Furth, Cadence H, PA-C  pantoprazole (PROTONIX) 40 MG tablet Take 1 tablet (40 mg total) by mouth daily. 09/08/22   Alleen Borne, MD  phentermine (ADIPEX-P) 37.5 MG tablet Take 1 tablet (37.5 mg total) by mouth daily before breakfast. Patient not taking: Reported on 10/12/2022 06/17/22   Miki Kins, FNP  spironolactone (ALDACTONE) 25 MG tablet Take 0.5 tablets (12.5 mg total) by mouth daily. 11/29/22 02/27/23  Furth, Cadence H, PA-C     Allergies  Azithromycin, Clindamycin, Codeine, Meloxicam, Neurontin [gabapentin], Penicillins, Clindamycin/lincomycin, Other, Diphenhydramine hcl, Doxycycline, and Egg-derived products   Family History   Family History  Problem Relation Age of Onset   Hypertension Mother    Alcohol abuse Mother    Deep vein thrombosis Mother    Alcohol abuse Father    COPD Father    Emphysema Father    Heart disease Father      Physical Exam  Triage Vital Signs: ED Triage Vitals  Encounter Vitals Group     BP 12/10/22 2238 (!) 151/108     Systolic BP  Percentile --      Diastolic BP Percentile --      Pulse Rate 12/10/22 2238 (!) 128     Resp 12/10/22 2238 20     Temp 12/10/22 2238 99 F (37.2 C)     Temp src --      SpO2 12/10/22 2238 99 %     Weight --      Height --      Head Circumference --      Peak Flow --      Pain Score 12/10/22 2237 10     Pain Loc --      Pain Education --      Exclude from Growth Chart --     Updated Vital Signs: BP (!) 151/108   Pulse (!) 128   Temp 99 F (37.2 C)   Resp 20   LMP 08/22/2017 Comment: says she just spots.  SpO2 99%   {Only need to document appropriate and relevant physical exam:1} General: Awake, no distress. *** CV:  Good peripheral perfusion. *** Resp:  Normal effort. *** Abd:  No distention. *** Other:  ***   ED Results / Procedures / Treatments  Labs (all labs ordered are listed, but only abnormal results are displayed) Labs Reviewed  LIPASE, BLOOD  COMPREHENSIVE METABOLIC PANEL  URINALYSIS, ROUTINE W REFLEX MICROSCOPIC  CBC     EKG  ***   RADIOLOGY *** {You MUST document your own interpretation of imaging, as well as the fact that you reviewed the radiologist's report!:1}  Official radiology report(s): No results found.   PROCEDURES:  Critical Care performed: {CriticalCareYesNo:19197::"Yes, see critical care procedure note(s)","No"}  Procedures   MEDICATIONS ORDERED IN ED: Medications - No data to display   IMPRESSION / MDM / ASSESSMENT AND PLAN / ED COURSE  I reviewed the triage vital signs and the nursing notes.                              Differential diagnosis includes, but is not limited to, ***  Patient's presentation is most consistent with {EM COPA:27473}  {If the patient is on the monitor, remove the brackets and asterisks on the sentence below and remember to document it as a Procedure as well. Otherwise delete the sentence below:1} {**The  patient is on the cardiac monitor to evaluate for evidence of arrhythmia and/or  significant heart rate changes.**}  {Remember to include, when applicable, any/all of the following data: independent review of imaging independent review of labs (comment specifically on pertinent positives and negatives) review of specific prior hospitalizations, PCP/specialist notes, etc. discuss meds given and prescribed document any discussion with consultants (including hospitalists) any clinical decision tools you used and why (PECARN, NEXUS, etc.) did you consider admitting the patient? document social determinants of health affecting patient's care (homelessness, inability to follow up in a timely fashion, etc) document any pre-existing conditions increasing risk on current visit (e.g. diabetes and HTN increasing danger of high-risk chest pain/ACS) describes what meds you gave (especially parenteral) and why any other interventions?:1}      FINAL CLINICAL IMPRESSION(S) / ED DIAGNOSES   Final diagnoses:  None     Rx / DC Orders   ED Discharge Orders     None        Note:  This document was prepared using Dragon voice recognition software and may include unintentional dictation errors.

## 2022-12-10 NOTE — ED Notes (Signed)
Pt denied being able to produce a urine sample at this time. Pt provided with a labeled specimen cup and instructions to return cup to triage nurse desk once it has a clean catch urine sample.

## 2022-12-10 NOTE — ED Triage Notes (Addendum)
Pt reporting two days of right upper abdominal pain and vomiting. Diarrhea yesterday. Hx of gallstones, pain feels similar. Has appt with GI scheduled

## 2022-12-11 ENCOUNTER — Inpatient Hospital Stay: Payer: MEDICAID

## 2022-12-11 DIAGNOSIS — I5022 Chronic systolic (congestive) heart failure: Secondary | ICD-10-CM | POA: Diagnosis present

## 2022-12-11 DIAGNOSIS — K8050 Calculus of bile duct without cholangitis or cholecystitis without obstruction: Secondary | ICD-10-CM | POA: Diagnosis not present

## 2022-12-11 DIAGNOSIS — R1010 Upper abdominal pain, unspecified: Secondary | ICD-10-CM | POA: Diagnosis not present

## 2022-12-11 DIAGNOSIS — J4520 Mild intermittent asthma, uncomplicated: Secondary | ICD-10-CM | POA: Diagnosis not present

## 2022-12-11 DIAGNOSIS — Z01810 Encounter for preprocedural cardiovascular examination: Secondary | ICD-10-CM | POA: Diagnosis not present

## 2022-12-11 DIAGNOSIS — F4310 Post-traumatic stress disorder, unspecified: Secondary | ICD-10-CM | POA: Diagnosis present

## 2022-12-11 DIAGNOSIS — K8020 Calculus of gallbladder without cholecystitis without obstruction: Secondary | ICD-10-CM | POA: Diagnosis not present

## 2022-12-11 DIAGNOSIS — F17210 Nicotine dependence, cigarettes, uncomplicated: Secondary | ICD-10-CM | POA: Diagnosis present

## 2022-12-11 DIAGNOSIS — Z7902 Long term (current) use of antithrombotics/antiplatelets: Secondary | ICD-10-CM | POA: Diagnosis not present

## 2022-12-11 DIAGNOSIS — E0590 Thyrotoxicosis, unspecified without thyrotoxic crisis or storm: Secondary | ICD-10-CM | POA: Insufficient documentation

## 2022-12-11 DIAGNOSIS — Z881 Allergy status to other antibiotic agents status: Secondary | ICD-10-CM | POA: Diagnosis not present

## 2022-12-11 DIAGNOSIS — K8012 Calculus of gallbladder with acute and chronic cholecystitis without obstruction: Secondary | ICD-10-CM | POA: Diagnosis present

## 2022-12-11 DIAGNOSIS — D6851 Activated protein C resistance: Secondary | ICD-10-CM | POA: Diagnosis present

## 2022-12-11 DIAGNOSIS — Z79899 Other long term (current) drug therapy: Secondary | ICD-10-CM | POA: Diagnosis not present

## 2022-12-11 DIAGNOSIS — I25118 Atherosclerotic heart disease of native coronary artery with other forms of angina pectoris: Secondary | ICD-10-CM

## 2022-12-11 DIAGNOSIS — I2510 Atherosclerotic heart disease of native coronary artery without angina pectoris: Secondary | ICD-10-CM | POA: Insufficient documentation

## 2022-12-11 DIAGNOSIS — R0789 Other chest pain: Secondary | ICD-10-CM | POA: Diagnosis not present

## 2022-12-11 DIAGNOSIS — K8018 Calculus of gallbladder with other cholecystitis without obstruction: Secondary | ICD-10-CM | POA: Diagnosis not present

## 2022-12-11 DIAGNOSIS — F419 Anxiety disorder, unspecified: Secondary | ICD-10-CM | POA: Diagnosis present

## 2022-12-11 DIAGNOSIS — E782 Mixed hyperlipidemia: Secondary | ICD-10-CM | POA: Diagnosis present

## 2022-12-11 DIAGNOSIS — F319 Bipolar disorder, unspecified: Secondary | ICD-10-CM | POA: Diagnosis present

## 2022-12-11 DIAGNOSIS — Z90710 Acquired absence of both cervix and uterus: Secondary | ICD-10-CM | POA: Diagnosis not present

## 2022-12-11 DIAGNOSIS — E785 Hyperlipidemia, unspecified: Secondary | ICD-10-CM | POA: Insufficient documentation

## 2022-12-11 DIAGNOSIS — F32A Depression, unspecified: Secondary | ICD-10-CM | POA: Insufficient documentation

## 2022-12-11 DIAGNOSIS — Z880 Allergy status to penicillin: Secondary | ICD-10-CM | POA: Diagnosis not present

## 2022-12-11 DIAGNOSIS — I252 Old myocardial infarction: Secondary | ICD-10-CM | POA: Diagnosis not present

## 2022-12-11 DIAGNOSIS — I255 Ischemic cardiomyopathy: Secondary | ICD-10-CM | POA: Diagnosis present

## 2022-12-11 DIAGNOSIS — R1011 Right upper quadrant pain: Secondary | ICD-10-CM

## 2022-12-11 DIAGNOSIS — J45909 Unspecified asthma, uncomplicated: Secondary | ICD-10-CM | POA: Diagnosis present

## 2022-12-11 DIAGNOSIS — Z7982 Long term (current) use of aspirin: Secondary | ICD-10-CM | POA: Diagnosis not present

## 2022-12-11 DIAGNOSIS — K812 Acute cholecystitis with chronic cholecystitis: Secondary | ICD-10-CM | POA: Diagnosis not present

## 2022-12-11 DIAGNOSIS — Z951 Presence of aortocoronary bypass graft: Secondary | ICD-10-CM | POA: Diagnosis not present

## 2022-12-11 DIAGNOSIS — G4733 Obstructive sleep apnea (adult) (pediatric): Secondary | ICD-10-CM | POA: Diagnosis present

## 2022-12-11 DIAGNOSIS — K219 Gastro-esophageal reflux disease without esophagitis: Secondary | ICD-10-CM | POA: Diagnosis present

## 2022-12-11 DIAGNOSIS — Z885 Allergy status to narcotic agent status: Secondary | ICD-10-CM | POA: Diagnosis not present

## 2022-12-11 DIAGNOSIS — Z8249 Family history of ischemic heart disease and other diseases of the circulatory system: Secondary | ICD-10-CM | POA: Diagnosis not present

## 2022-12-11 LAB — CBC WITH DIFFERENTIAL/PLATELET
Abs Immature Granulocytes: 0.07 10*3/uL (ref 0.00–0.07)
Basophils Absolute: 0.1 10*3/uL (ref 0.0–0.1)
Basophils Relative: 0 %
Eosinophils Absolute: 0 10*3/uL (ref 0.0–0.5)
Eosinophils Relative: 0 %
HCT: 43.5 % (ref 36.0–46.0)
Hemoglobin: 14.2 g/dL (ref 12.0–15.0)
Immature Granulocytes: 0 %
Lymphocytes Relative: 12 %
Lymphs Abs: 1.9 10*3/uL (ref 0.7–4.0)
MCH: 27.7 pg (ref 26.0–34.0)
MCHC: 32.6 g/dL (ref 30.0–36.0)
MCV: 85 fL (ref 80.0–100.0)
Monocytes Absolute: 1.3 10*3/uL — ABNORMAL HIGH (ref 0.1–1.0)
Monocytes Relative: 8 %
Neutro Abs: 13.2 10*3/uL — ABNORMAL HIGH (ref 1.7–7.7)
Neutrophils Relative %: 80 %
Platelets: 295 10*3/uL (ref 150–400)
RBC: 5.12 MIL/uL — ABNORMAL HIGH (ref 3.87–5.11)
RDW: 14.6 % (ref 11.5–15.5)
WBC: 16.6 10*3/uL — ABNORMAL HIGH (ref 4.0–10.5)
nRBC: 0 % (ref 0.0–0.2)

## 2022-12-11 LAB — COMPREHENSIVE METABOLIC PANEL
ALT: 14 U/L (ref 0–44)
AST: 14 U/L — ABNORMAL LOW (ref 15–41)
Albumin: 3.2 g/dL — ABNORMAL LOW (ref 3.5–5.0)
Alkaline Phosphatase: 95 U/L (ref 38–126)
Anion gap: 11 (ref 5–15)
BUN: 9 mg/dL (ref 6–20)
CO2: 24 mmol/L (ref 22–32)
Calcium: 8.6 mg/dL — ABNORMAL LOW (ref 8.9–10.3)
Chloride: 104 mmol/L (ref 98–111)
Creatinine, Ser: 0.54 mg/dL (ref 0.44–1.00)
GFR, Estimated: >60 mL/min (ref >60)
Glucose, Bld: 99 mg/dL (ref 70–99)
Potassium: 3.3 mmol/L — ABNORMAL LOW (ref 3.5–5.1)
Sodium: 139 mmol/L (ref 135–145)
Total Bilirubin: 0.8 mg/dL (ref 0.3–1.2)
Total Protein: 6.5 g/dL (ref 6.5–8.1)

## 2022-12-11 LAB — CBC
HCT: 42.4 % (ref 36.0–46.0)
Hemoglobin: 13.6 g/dL (ref 12.0–15.0)
MCH: 27.5 pg (ref 26.0–34.0)
MCHC: 32.1 g/dL (ref 30.0–36.0)
MCV: 85.8 fL (ref 80.0–100.0)
Platelets: 282 10*3/uL (ref 150–400)
RBC: 4.94 MIL/uL (ref 3.87–5.11)
RDW: 14.7 % (ref 11.5–15.5)
WBC: 16.8 10*3/uL — ABNORMAL HIGH (ref 4.0–10.5)
nRBC: 0 % (ref 0.0–0.2)

## 2022-12-11 LAB — TROPONIN I (HIGH SENSITIVITY): Troponin I (High Sensitivity): 6 ng/L (ref <18)

## 2022-12-11 MED ORDER — ENOXAPARIN SODIUM 60 MG/0.6ML IJ SOSY: 50.0000 mg | PREFILLED_SYRINGE | INTRAMUSCULAR | Status: DC

## 2022-12-11 MED ORDER — IOHEXOL 300 MG/ML  SOLN
100.0000 mL | Freq: Once | INTRAMUSCULAR | Status: AC | PRN
  Administered 2022-12-11: 100 mL via INTRAVENOUS

## 2022-12-11 MED ORDER — ONDANSETRON HCL 4 MG/2ML IJ SOLN
4.0000 mg | Freq: Four times a day (QID) | INTRAMUSCULAR | Status: DC | PRN
  Administered 2022-12-11: 4 mg via INTRAVENOUS
  Filled 2022-12-11: qty 2

## 2022-12-11 MED ORDER — MAGNESIUM HYDROXIDE 400 MG/5ML PO SUSP: 30.0000 mL | Freq: Every day | ORAL | Status: DC | PRN

## 2022-12-11 MED ORDER — SPIRONOLACTONE 12.5 MG HALF TABLET
12.5000 mg | ORAL_TABLET | Freq: Every day | ORAL | Status: DC
  Administered 2022-12-11 – 2022-12-13 (×3): 12.5 mg via ORAL
  Filled 2022-12-11 (×3): qty 1

## 2022-12-11 MED ORDER — ACETAMINOPHEN 650 MG RE SUPP: 650.0000 mg | Freq: Four times a day (QID) | RECTAL | Status: DC | PRN

## 2022-12-11 MED ORDER — ALPRAZOLAM ER 0.5 MG PO TB24
0.5000 mg | ORAL_TABLET | Freq: Two times a day (BID) | ORAL | Status: DC | PRN
  Administered 2022-12-11 (×2): 0.5 mg via ORAL
  Filled 2022-12-11 (×5): qty 1

## 2022-12-11 MED ORDER — HYDROMORPHONE HCL 1 MG/ML IJ SOLN
1.0000 mg | Freq: Once | INTRAMUSCULAR | Status: AC
  Administered 2022-12-11: 1 mg via INTRAVENOUS
  Filled 2022-12-11: qty 1

## 2022-12-11 MED ORDER — ATORVASTATIN CALCIUM 20 MG PO TABS
40.0000 mg | ORAL_TABLET | Freq: Every day | ORAL | Status: DC
  Administered 2022-12-11 – 2022-12-13 (×3): 40 mg via ORAL
  Filled 2022-12-11 (×3): qty 2

## 2022-12-11 MED ORDER — LORAZEPAM 2 MG/ML IJ SOLN
0.5000 mg | Freq: Once | INTRAMUSCULAR | Status: AC
  Administered 2022-12-11: 0.5 mg via INTRAVENOUS
  Filled 2022-12-11: qty 1

## 2022-12-11 MED ORDER — LORAZEPAM 2 MG/ML IJ SOLN
1.0000 mg | Freq: Once | INTRAMUSCULAR | Status: AC
  Administered 2022-12-11: 1 mg via INTRAVENOUS
  Filled 2022-12-11: qty 1

## 2022-12-11 MED ORDER — ONDANSETRON HCL 4 MG PO TABS: 4.0000 mg | ORAL_TABLET | Freq: Four times a day (QID) | ORAL | Status: DC | PRN

## 2022-12-11 MED ORDER — SODIUM CHLORIDE 0.9 % IV SOLN: INTRAVENOUS | Status: DC

## 2022-12-11 MED ORDER — ASPIRIN 81 MG PO TBEC
81.0000 mg | DELAYED_RELEASE_TABLET | Freq: Every day | ORAL | Status: DC
  Administered 2022-12-11 – 2022-12-13 (×3): 81 mg via ORAL
  Filled 2022-12-11 (×3): qty 1

## 2022-12-11 MED ORDER — EPINEPHRINE 0.3 MG/0.3ML IJ SOAJ: 0.3000 mg | Freq: Once | INTRAMUSCULAR | Status: DC | PRN

## 2022-12-11 MED ORDER — NICOTINE 21 MG/24HR TD PT24
21.0000 mg | MEDICATED_PATCH | Freq: Every day | TRANSDERMAL | Status: DC
  Administered 2022-12-12: 21 mg via TRANSDERMAL
  Filled 2022-12-11 (×2): qty 1

## 2022-12-11 MED ORDER — METOPROLOL SUCCINATE ER 25 MG PO TB24
25.0000 mg | ORAL_TABLET | Freq: Every day | ORAL | Status: DC
  Administered 2022-12-11 – 2022-12-13 (×3): 25 mg via ORAL
  Filled 2022-12-11 (×3): qty 1

## 2022-12-11 MED ORDER — HALOPERIDOL LACTATE 5 MG/ML IJ SOLN
2.0000 mg | Freq: Four times a day (QID) | INTRAMUSCULAR | Status: DC | PRN
  Administered 2022-12-11 – 2022-12-12 (×2): 2 mg via INTRAVENOUS
  Filled 2022-12-11 (×2): qty 1

## 2022-12-11 MED ORDER — TRAZODONE HCL 50 MG PO TABS: 25.0000 mg | ORAL_TABLET | Freq: Every evening | ORAL | Status: DC | PRN

## 2022-12-11 MED ORDER — COLCHICINE 0.6 MG PO TABS
0.6000 mg | ORAL_TABLET | Freq: Every day | ORAL | Status: DC
  Administered 2022-12-11 – 2022-12-13 (×3): 0.6 mg via ORAL
  Filled 2022-12-11 (×3): qty 1

## 2022-12-11 MED ORDER — METRONIDAZOLE 500 MG/100ML IV SOLN
500.0000 mg | Freq: Once | INTRAVENOUS | Status: AC
  Administered 2022-12-11: 500 mg via INTRAVENOUS
  Filled 2022-12-11: qty 100

## 2022-12-11 MED ORDER — MORPHINE SULFATE (PF) 2 MG/ML IV SOLN
2.0000 mg | INTRAVENOUS | Status: DC | PRN
  Administered 2022-12-11 – 2022-12-13 (×6): 2 mg via INTRAVENOUS
  Filled 2022-12-11 (×6): qty 1

## 2022-12-11 MED ORDER — HYDROXYZINE HCL 25 MG PO TABS
25.0000 mg | ORAL_TABLET | Freq: Three times a day (TID) | ORAL | Status: DC | PRN
  Administered 2022-12-12: 25 mg via ORAL
  Filled 2022-12-11: qty 1

## 2022-12-11 MED ORDER — HYDROCODONE-ACETAMINOPHEN 5-325 MG PO TABS
1.0000 | ORAL_TABLET | Freq: Four times a day (QID) | ORAL | Status: DC | PRN
  Administered 2022-12-11 – 2022-12-12 (×4): 1 via ORAL
  Filled 2022-12-11 (×4): qty 1

## 2022-12-11 MED ORDER — ACETAMINOPHEN 325 MG PO TABS
650.0000 mg | ORAL_TABLET | Freq: Four times a day (QID) | ORAL | Status: DC | PRN
  Administered 2022-12-11: 650 mg via ORAL
  Filled 2022-12-11: qty 2

## 2022-12-11 MED ORDER — HYDROMORPHONE HCL 1 MG/ML IJ SOLN: 1.0000 mg | INTRAMUSCULAR | Status: DC | PRN

## 2022-12-11 MED ORDER — TECHNETIUM TC 99M MEBROFENIN IV KIT
5.3800 | PACK | Freq: Once | INTRAVENOUS | Status: AC | PRN
  Administered 2022-12-11: 5.38 via INTRAVENOUS

## 2022-12-11 NOTE — Assessment & Plan Note (Signed)
-   We will can use Xanax and hydroxyzine.

## 2022-12-11 NOTE — Assessment & Plan Note (Signed)
-   We will continue statin therapy. 

## 2022-12-11 NOTE — H&P (Addendum)
[place]   PATIENT NAME: Elizabeth Spencer    MR#:  [id number]  DATE OF BIRTH:  12/10/1985  DATE OF ADMISSION:  12/10/2022  PRIMARY CARE PHYSICIAN: Miki Kins, FNP   Patient is coming from: Home  REQUESTING/REFERRING PHYSICIAN: Chiquita Loth, MD  CHIEF COMPLAINT:   Chief Complaint  Patient presents with   Abdominal Pain    HISTORY OF PRESENT ILLNESS:  Elizabeth Spencer is a 37 y.o. Caucasian female with medical history significant for GERD, asthma, osteoarthritis, coronary artery disease status post CABG, PTSD, who presented to the emergency room with a concern of left that since yesterday and recurrent nausea and vomiting over the last 3 days.  No fever or chills.  No diarrhea or melena or bright red bleeding per rectum.  She had not any bilious vomitus or hematemesis.  No dysuria, oliguria or hematuria or flank pain.  ED Course: When she her ER, heart rate was 128 with temperature of 99 and a BP of 151/108 and later on respiratory rate was 22.  Vital signs were otherwise normal.  Labs revealed a blood glucose of 142 with a CO2 of 20 and CBC showed leukocytosis 16.6 with neutrophilia.  EKG as reviewed by me :  EKG showed sinus tachycardia with rate 125 with right axis deviation. Imaging: Right upper quadrant ultrasound revealed cholelithiasis without cholecystitis and fatty liver.  The patient was given 1 mg of IV Dilaudid twice, IV Flagyl, IV Zofran and 1 L bolus of IV normal saline.  She will be admitted to a cardiac telemetry bed for further evaluation and management.  l PAST MEDICAL HISTORY:   Past Medical History:  Diagnosis Date   Aneurysm (HCC) 01/04/2014   Arthritis    Asthma    WELL CONTROLLED   Brain aneurysm 2007   NEUROLOGY NOTE DOES NOT MENTION ANEURYSM BUT PT STATES SHE DID NOT HAVE TO HAVE SURGERY   Complication of anesthesia    FOR 1 C-SECTION PT WAS ITCHING AND VERY RED ON HER FACE   Cough, persistent 01/27/2016   Dermatitis due to sunburn 11/08/2013    Family history of adverse reaction to anesthesia    brother, neice and nephew got red in face with hives   Gallstones    GERD (gastroesophageal reflux disease)    Gonorrhea 06/20/2020   Headache    CHRONIC HEADACHES   History of chronic cough    DRY   Loss of memory 05/30/2017   Perforation of left tympanic membrane 05/17/2013   Pneumonia    PTSD (post-traumatic stress disorder)    PTSD (post-traumatic stress disorder)    Thyroid condition    PT WAS JUST TOLD ON 08-25-17 THAT SHE HAS A THYROID PROBLEM AND IS GOING TO F/U WITH ENDOCRINOLOGIST IN 2 WEEKS   Trichomonas vaginalis (TV) infection 06/20/2020  -Coronary artery disease  PAST SURGICAL HISTORY:   Past Surgical History:  Procedure Laterality Date   ABDOMINAL HYSTERECTOMY     CESAREAN SECTION     x3   CORONARY ARTERY BYPASS GRAFT N/A 09/02/2022   Procedure: CORONARY ARTERY BYPASS GRAFTING (CABG) X 1 USING LEFT INTERNAL MAMMARY ARTERY;  Surgeon: Alleen Borne, MD;  Location: [facility] OR;  Service: Open Heart Surgery;  Laterality: N/A;   CYSTOSCOPY N/A 09/29/2017   Procedure: CYSTOSCOPY;  Surgeon: Vena Austria, MD;  Location: [facility] ORS;  Service: Gynecology;  Laterality: N/A;   CYSTOSCOPY N/A 11/05/2017   Procedure: CYSTOSCOPY;  Surgeon: Vena Austria, MD;  Location: [facility]  ORS;  Service: Gynecology;  Laterality: N/A;   HYSTEROSCOPY WITH D & C N/A 09/01/2017   Procedure: DILATATION AND CURETTAGE /HYSTEROSCOPY;  Surgeon: Vena Austria, MD;  Location: [facility] ORS;  Service: Gynecology;  Laterality: N/A;   LAPAROSCOPY N/A 09/01/2017   Procedure: LAPAROSCOPY OPERATIVE with biopsy;  Surgeon: Vena Austria, MD;  Location: [facility] ORS;  Service: Gynecology;  Laterality: N/A;   LAPAROSCOPY N/A 11/05/2017   Procedure: LAPAROSCOPY DIAGNOSTIC;  Surgeon: Vena Austria, MD;  Location: [facility] ORS;  Service: Gynecology;  Laterality: N/A;   LEFT HEART CATH AND CORONARY ANGIOGRAPHY N/A 09/01/2022   Procedure: LEFT HEART CATH AND CORONARY  ANGIOGRAPHY;  Surgeon: Yvonne Kendall, MD;  Location: [facility] INVASIVE CV LAB;  Service: Cardiovascular;  Laterality: N/A;   REPAIR VAGINAL CUFF N/A 11/05/2017   Procedure: REPAIR VAGINAL CUFF;  Surgeon: Vena Austria, MD;  Location: [facility] ORS;  Service: Gynecology;  Laterality: N/A;   TEE WITHOUT CARDIOVERSION N/A 09/02/2022   Procedure: TRANSESOPHAGEAL ECHOCARDIOGRAM;  Surgeon: Alleen Borne, MD;  Location: [facility] OR;  Service: Open Heart Surgery;  Laterality: N/A;   TOTAL LAPAROSCOPIC HYSTERECTOMY WITH SALPINGECTOMY Bilateral 09/29/2017   Procedure: TOTAL LAPAROSCOPIC HYSTERECTOMY WITH SALPINGECTOMY;  Surgeon: Vena Austria, MD;  Location: [facility] ORS;  Service: Gynecology;  Laterality: Bilateral;    SOCIAL HISTORY:   Social History   Tobacco Use   Smoking status: Every Day    Current packs/day: 0.50    Average packs/day: 0.5 packs/day for 18.0 years (9.0 ttl pk-yrs)    Types: Cigarettes   Smokeless tobacco: Never  Substance Use Topics   Alcohol use: Yes    Alcohol/week: 0.0 standard drinks of alcohol    Comment: occasionally    FAMILY HISTORY:   Family History  Problem Relation Age of Onset   Hypertension Mother    Alcohol abuse Mother    Deep vein thrombosis Mother    Alcohol abuse Father    COPD Father    Emphysema Father    Heart disease Father     DRUG ALLERGIES:   Allergies  Allergen Reactions   Azithromycin Swelling and Other (See Comments)   Clindamycin Swelling   Codeine Hives and Other (See Comments)    Nausea/dizzy Nausea/dizzy   Meloxicam Other (See Comments)    Burns up from the inside    Neurontin [Gabapentin] Other (See Comments)    Makes her pass out and not remember what happened prior to taking med.   Penicillins Anaphylaxis    Patient allergic to all "cillins" Has patient had a PCN reaction causing immediate rash, facial/tongue/throat swelling, SOB or lightheadedness with hypotension: Yes Has patient had a PCN reaction causing severe rash  involving mucus membranes or skin necrosis: Yes Has patient had a PCN reaction that required hospitalization: No Has patient had a PCN reaction occurring within the last 10 years: Yes If all of the above answers are "NO", then may proceed with Cephalosporin use.    Clindamycin/Lincomycin Swelling   Other Hives and Other (See Comments)    Berry flavored food and drinks   Diphenhydramine Hcl Rash and Other (See Comments)   Doxycycline Rash   Egg-Derived Products Rash    REVIEW OF SYSTEMS:   ROS As per history of present illness. All pertinent systems were reviewed above. Constitutional, HEENT, cardiovascular, respiratory, GI, GU, musculoskeletal, neuro, psychiatric, endocrine, integumentary and hematologic systems were reviewed and are otherwise negative/unremarkable except for positive findings mentioned above in the HPI.   MEDICATIONS AT HOME:   Prior to Admission medications  Medication Sig Start Date End Date Taking? Authorizing Provider  albuterol (VENTOLIN HFA) 108 (90 Base) MCG/ACT inhaler Inhale 2 puffs into the lungs every 6 (six) hours as needed for wheezing or shortness of breath. 06/17/22   Miki Kins, FNP  ALPRAZolam (XANAX XR) 0.5 MG 24 hr tablet Take 1 tablet (0.5 mg total) by mouth 2 (two) times daily as needed for anxiety or sleep. 10/21/22   Miki Kins, FNP  aspirin 81 MG chewable tablet Spencer 1 tablet (81 mg total) by mouth daily. 10/12/22   Furth, Cadence H, PA-C  atorvastatin (LIPITOR) 40 MG tablet Take 1 tablet (40 mg total) by mouth daily. 10/13/22   Furth, Cadence H, PA-C  cetirizine (ZYRTEC) 10 MG tablet Take 1 tablet (10 mg total) by mouth daily. 06/17/22   Miki Kins, FNP  clopidogrel (PLAVIX) 75 MG tablet Take 1 tablet (75 mg total) by mouth daily. 10/12/22   Furth, Cadence H, PA-C  colchicine 0.6 MG tablet Take 1 tablet (0.6 mg total) by mouth daily. 09/08/22   Stehler, Oren Bracket, PA-C  dapagliflozin propanediol (FARXIGA) 10 MG TABS tablet Take 1  tablet (10 mg total) by mouth daily before breakfast. 10/12/22   Furth, Cadence H, PA-C  EPIPEN 2-PAK 0.3 MG/0.3ML SOAJ injection INJECT INTO THIGH MUSCLE THROUGH CLOTHES AS NEEDED SEVERE ALLERGIC REACTION 06/17/22   Miki Kins, FNP  fluticasone (FLONASE) 50 MCG/ACT nasal spray 1 spray by Each Nare route daily. 06/17/22   Miki Kins, FNP  HYDROcodone-acetaminophen (NORCO/VICODIN) 5-325 MG tablet Take 1 tablet by mouth every 6 (six) hours as needed for moderate pain. 11/15/22   Miki Kins, FNP  hydrOXYzine (ATARAX) 25 MG tablet Take 1 tablet (25 mg total) by mouth 3 (three) times daily as needed for anxiety. 10/01/22   Delton Prairie, MD  lidocaine (LIDODERM) 5 % Place 1 patch onto the skin every 12 (twelve) hours. Remove & Discard patch within 12 hours or as directed by MD 10/21/22 10/21/23  Delton Prairie, MD  methimazole (TAPAZOLE) 10 MG tablet Take 1 tablet (10 mg total) by mouth daily. 11/29/22   Miki Kins, FNP  metoprolol succinate (TOPROL XL) 25 MG 24 hr tablet Take 1 tablet (25 mg total) by mouth daily. 11/29/22   Furth, Cadence H, PA-C  montelukast (SINGULAIR) 10 MG tablet Take 1 tablet (10 mg total) by mouth daily. 06/17/22   Miki Kins, FNP  Multiple Vitamins-Minerals (CENTRUM ADULTS PO) Take 1 tablet by mouth at bedtime.     [provider]  nicotine (NICODERM CQ - DOSED IN MG/24 HOURS) 21 mg/24hr patch Place 1 patch (21 mg total) onto the skin daily. Patient not taking: Reported on 11/29/2022 10/13/22   Furth, Cadence H, PA-C  pantoprazole (PROTONIX) 40 MG tablet Take 1 tablet (40 mg total) by mouth daily. 09/08/22   Alleen Borne, MD  phentermine (ADIPEX-P) 37.5 MG tablet Take 1 tablet (37.5 mg total) by mouth daily before breakfast. Patient not taking: Reported on 10/12/2022 06/17/22   Miki Kins, FNP  spironolactone (ALDACTONE) 25 MG tablet Take 0.5 tablets (12.5 mg total) by mouth daily. 11/29/22 02/27/23  Furth, Cadence H, PA-C      VITAL SIGNS:   Blood pressure (!) 132/91, pulse (!) 109, temperature 98.8 F (37.1 C), temperature source Oral, resp. rate 18, last menstrual period 08/22/2017, SpO2 100%.  PHYSICAL EXAMINATION:  Physical Exam  GENERAL:  37 y.o.-year-old Caucasian female patient lying in the bed with no acute  distress.  EYES: Pupils equal, round, reactive to light and accommodation. No scleral icterus. Extraocular muscles intact.  HEENT: Head atraumatic, normocephalic. Oropharynx and nasopharynx clear.  NECK:  Supple, no jugular venous distention. No thyroid enlargement, no tenderness.  LUNGS: Normal breath sounds bilaterally, no wheezing, rales,rhonchi or crepitation. No use of accessory muscles of respiration.  CARDIOVASCULAR: Regular rate and rhythm, S1, S2 normal. No murmurs, rubs, or gallops.  ABDOMEN: Soft, nondistended, with right upper quadrant tenderness without rebound tenderness guarding or rigidity.. Bowel sounds present. No organomegaly or mass.  EXTREMITIES: No pedal edema, cyanosis, or clubbing.  NEUROLOGIC: Cranial nerves II through XII are intact. Muscle strength 5/5 in all extremities. Sensation intact. Gait not checked.  PSYCHIATRIC: The patient is alert and oriented x 3.  Normal affect and good eye contact. SKIN: No obvious rash, lesion, or ulcer.   LABORATORY PANEL:   CBC Recent Labs  Lab 12/11/22 0100  WBC 16.6*  HGB 14.2  HCT 43.5  PLT 295   ------------------------------------------------------------------------------------------------------------------  Chemistries  Recent Labs  Lab 12/10/22 2240  NA 135  K 3.7  CL 101  CO2 20*  GLUCOSE 142*  BUN 10  CREATININE 0.55  CALCIUM 9.5  AST 29  ALT 16  ALKPHOS 114  BILITOT 0.6   ------------------------------------------------------------------------------------------------------------------  Cardiac Enzymes No results for input(s): "TROPONINI" in the last 168  hours. ------------------------------------------------------------------------------------------------------------------  RADIOLOGY:  US ABDOMEN LIMITED RUQ (LIVER/GB)  Result Date: 12/10/2022 CLINICAL DATA:  Upper abdominal pain. EXAM: ULTRASOUND ABDOMEN LIMITED RIGHT UPPER QUADRANT COMPARISON:  None Available. FINDINGS: Evaluation is limited due to body habitus and overlying bowel gas. Gallbladder: Multiple stones within the gallbladder. No gallbladder wall thickening or pericholecystic fluid. Positive sonographic Murphy's sign reported. Common bile duct: No biliary ductal dilatation. The common bile duct is not visualized. Liver: There is diffuse increased liver echogenicity most commonly seen in the setting of fatty infiltration. Superimposed inflammation or fibrosis is not excluded. Clinical correlation is recommended. Portal vein is patent on color Doppler imaging with normal direction of blood flow towards the liver. Other: None. IMPRESSION: 1. Cholelithiasis without definite sonographic evidence of acute cholecystitis. A hepatobiliary scintigraphy may provide better evaluation of the gallbladder if there is a high clinical concern for acute cholecystitis . 2. Fatty liver. Electronically Signed   By: Elgie Collard M.D.   On: 12/10/2022 23:55      IMPRESSION AND PLAN:  Assessment and Plan: * Cholelithiasis - This is clearly symptomatic.  It is likely the culprit for her chest pain that is atypical. - The patient will be admitted to a cardiac telemetry bed given her chest pain. - Pain management will be provided. - Will be hydrated with IV normal saline. - We will follow serial troponins. - Will be kept NPO. - HIDA scan will be obtained. - General Surgery consult will be obtained. - Dr. Everlene Farrier was notified and is aware about the patient.  Coronary artery disease - We will continue statin therapy and beta-blocker therapy. - Aspirin and Plavix are being held off for potential surgical  intervention.  Asthma, chronic - We will continue albuterol and Singulair.  Anxiety and depression - We will can use Xanax and hydroxyzine.  Hyperthyroidism - We will continue methimazole.  Dyslipidemia - We will continue statin therapy.       DVT prophylaxis: Lovenox.  Advanced Care Planning:  Code Status: full code.  Family Communication:  The plan of care was discussed in details with the patient (and family). I answered all  questions. The patient agreed to proceed with the above mentioned plan. Further management will depend upon hospital course. Disposition Plan: Back to previous home environment Consults called: none.  All the records are reviewed and case discussed with ED provider.  Status is: Inpatient  At the time of the admission, it appears that the appropriate admission status for this patient is inpatient.  This is judged to be reasonable and necessary in order to provide the required intensity of service to ensure the patient's safety given the presenting symptoms, physical exam findings and initial radiographic and laboratory data in the context of comorbid conditions.  The patient requires inpatient status due to high intensity of service, high risk of further deterioration and high frequency of surveillance required.  I certify that at the time of admission, it is my clinical judgment that the patient will require inpatient hospital care extending more than 2 midnights.                            Dispo: The patient is from: Home              Anticipated d/c is to: Home              Patient currently is not medically stable to d/c.              Difficult to place patient: No  Hannah Beat M.D on 12/11/2022 at 6:53 AM  [facility]   From 7 PM-7 AM, contact night-coverage [email or web address]  CC: Primary care physician; Miki Kins, FNP

## 2022-12-11 NOTE — Progress Notes (Signed)
PHARMACIST - PHYSICIAN COMMUNICATION  CONCERNING:  Enoxaparin (Lovenox) for DVT Prophylaxis    RECOMMENDATION: Patient was prescribed enoxaprin 40mg  q24 hours for VTE prophylaxis.   There were no vitals filed for this visit.  There is no height or weight on file to calculate BMI.  Estimated Creatinine Clearance: 116.6 mL/min (by C-G formula based on SCr of 0.55 mg/dL).   Based on [facility] policy patient is candidate for enoxaparin 0.5mg /kg TBW SQ every 24 hours based on BMI being >30.  DESCRIPTION: Pharmacy has adjusted enoxaparin dose per [facility] policy.  Patient is now receiving enoxaparin 0.5 mg/kg every 24 hours   Otelia Sergeant, PharmD, [facility] 12/11/2022 2:42 AM

## 2022-12-11 NOTE — ED Notes (Signed)
Pt is resting at this time. Will try to get a full set of VS once pt wakes up.

## 2022-12-11 NOTE — ED Notes (Signed)
This RN talked with pt at nuclear medicine to encourage her to continue the scan and educated pt as to why it needs to be done, pt agreeable at this time

## 2022-12-11 NOTE — ED Notes (Addendum)
Pt going to CT

## 2022-12-11 NOTE — Assessment & Plan Note (Signed)
-   We will continue albuterol and Singulair.

## 2022-12-11 NOTE — ED Notes (Signed)
Pt family arrived at bedside

## 2022-12-11 NOTE — ED Notes (Signed)
Pt gone to Nuclear medicine

## 2022-12-11 NOTE — Plan of Care (Signed)

## 2022-12-11 NOTE — ED Notes (Signed)
Hospitalist messaged to switch pain medications per patient request, see orders.

## 2022-12-11 NOTE — Assessment & Plan Note (Addendum)
-   This is clearly symptomatic.  It is likely the culprit for her chest pain that is atypical. - The patient will be admitted to a cardiac telemetry bed given her chest pain. - Pain management will be provided. - Will be hydrated with IV normal saline. - We will follow serial troponins. - Will be kept NPO. - HIDA scan will be obtained. - General Surgery consult will be obtained. - Dr. Everlene Farrier was notified and is aware about the patient.

## 2022-12-11 NOTE — ED Notes (Signed)
Pt ambulated to bathroom without assistance. Pt back into bed

## 2022-12-11 NOTE — Assessment & Plan Note (Signed)
-   We will continue statin therapy and beta-blocker therapy. - Aspirin and Plavix are being held off for potential surgical intervention.

## 2022-12-11 NOTE — ED Notes (Signed)
Pt given CT contrast drink and verbalized understanding of instructions on drinking it.

## 2022-12-11 NOTE — ED Notes (Signed)
Pt refused CT scan, pt states that she is not doing it. Pt educated on nuclear medicine study and hospitalist messaged to ask for higher anxiety medicine prior to study. Pt informed of plan. No narcotics are allowed prior to the study and pt informed that for the surgeons to consider doing surgery this test will need to be completed. Pt agreeable at this time.

## 2022-12-11 NOTE — Consult Note (Signed)
Cardiology Consultation   Patient ID: Elizabeth Spencer MRN: [id number]; DOB: 08/14/1985  Admit date: 12/10/2022 Date of Consult: 12/11/2022  PCP:  Miki Kins, FNP   [facility] Providers Cardiologist:  None   {  Patient Profile:   Elizabeth Spencer is a 37 y.o. 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  History of Present Illness:   Elizabeth Spencer was seen in May 2024 for chest pain during an admission.  She had recently been diagnosed with factor V Leiden and was going to be started on a blood thinner.  High-sensitivity troponin went up to 1163.  UDS was positive for cocaine.  She was started on IV heparin and admitted.  Patient underwent heart cath that showed severe single-vessel CAD with hazy eccentric 90% ostial LAD stenosis concerning for acute plaque rupture, tubular 40% proximal RCA stenosis large circumflex without significant disease.  EF noted to be 30 to 35%.  Follow-up echo confirmed LVEF of 30 to 35%, mild LVH, grade 1 diastolic dysfunction, mild MR.  Patient was transferred to [facility] for CABG consideration.  She underwent CABG x 1 utilizing LIMA to LAD.  Postop period was uncomplicated.  Patient was discharged on 09/03/2022.  Patient was seen in the office November 29, 2022 for ER follow-up for left arm pain.  She also has anxiety and musculoskeletal pain in her chest and shoulder.  Noted she had hyperthyroidism and abnormal TSH and free T4 in May 2024.  She denied any recent cocaine use.  Was recommended patient restrain from using her arms or lifting heavy weights.  Patient was started on spironolactone for HFrEF.  Heart rate was high and Toprol was refilled.  It was recommended she see  endocrinology for hyper thyroidism.\  The patient presented to the ER on 12/11/2022 for abdominal pain.  She reported nausea and vomiting for the last few days.  She denies fever or chills, no diarrhea, melena, BRBPR.  She denies any chest pain.  In the ER blood pressure 151/108, temperature 99, heart rate 120 bpm.  Labs showed leukocytosis 16.6, blood glucose 142, CO2 20.  EKG shows sinus tachycardia, 125bpm with RAD.  Right upper quadrant ultrasound revealed cholelithiasis without cholecystitis and fatty liver.  Patient was admitted for further workup.  Patient was unable to complete HIDA scan.  Patient is wanting to undergo cholecystectomy.  General surgery asked cardiology to consult for preop evaluation.  Past Medical History:  Diagnosis Date   Aneurysm (HCC) 01/04/2014   Arthritis    Asthma    WELL CONTROLLED   Brain aneurysm 2007   NEUROLOGY NOTE DOES NOT MENTION ANEURYSM BUT PT STATES SHE DID NOT HAVE TO HAVE SURGERY   Complication of anesthesia    FOR 1 C-SECTION PT WAS ITCHING AND VERY RED ON HER FACE   Cough, persistent 01/27/2016   Dermatitis due to sunburn 11/08/2013   Family history of adverse reaction to anesthesia    brother, neice and nephew got red in face with hives   Gallstones    GERD (gastroesophageal reflux disease)    Gonorrhea 06/20/2020   Headache    CHRONIC HEADACHES   History of chronic cough    DRY   Loss of memory 05/30/2017  Perforation of left tympanic membrane 05/17/2013   Pneumonia    PTSD (post-traumatic stress disorder)    PTSD (post-traumatic stress disorder)    Thyroid condition    PT WAS JUST TOLD ON 08-25-17 THAT SHE HAS A THYROID PROBLEM AND IS GOING TO F/U WITH ENDOCRINOLOGIST IN 2 WEEKS   Trichomonas vaginalis (TV) infection 06/20/2020    Past Surgical History:  Procedure Laterality Date   ABDOMINAL HYSTERECTOMY     CESAREAN SECTION     x3   CORONARY ARTERY BYPASS GRAFT N/A 09/02/2022   Procedure: CORONARY ARTERY BYPASS GRAFTING  (CABG) X 1 USING LEFT INTERNAL MAMMARY ARTERY;  Surgeon: Alleen Borne, MD;  Location: [facility] OR;  Service: Open Heart Surgery;  Laterality: N/A;   CYSTOSCOPY N/A 09/29/2017   Procedure: CYSTOSCOPY;  Surgeon: Vena Austria, MD;  Location: [facility] ORS;  Service: Gynecology;  Laterality: N/A;   CYSTOSCOPY N/A 11/05/2017   Procedure: CYSTOSCOPY;  Surgeon: Vena Austria, MD;  Location: [facility] ORS;  Service: Gynecology;  Laterality: N/A;   HYSTEROSCOPY WITH D & C N/A 09/01/2017   Procedure: DILATATION AND CURETTAGE /HYSTEROSCOPY;  Surgeon: Vena Austria, MD;  Location: [facility] ORS;  Service: Gynecology;  Laterality: N/A;   LAPAROSCOPY N/A 09/01/2017   Procedure: LAPAROSCOPY OPERATIVE with biopsy;  Surgeon: Vena Austria, MD;  Location: [facility] ORS;  Service: Gynecology;  Laterality: N/A;   LAPAROSCOPY N/A 11/05/2017   Procedure: LAPAROSCOPY DIAGNOSTIC;  Surgeon: Vena Austria, MD;  Location: [facility] ORS;  Service: Gynecology;  Laterality: N/A;   LEFT HEART CATH AND CORONARY ANGIOGRAPHY N/A 09/01/2022   Procedure: LEFT HEART CATH AND CORONARY ANGIOGRAPHY;  Surgeon: Yvonne Kendall, MD;  Location: [facility] INVASIVE CV LAB;  Service: Cardiovascular;  Laterality: N/A;   REPAIR VAGINAL CUFF N/A 11/05/2017   Procedure: REPAIR VAGINAL CUFF;  Surgeon: Vena Austria, MD;  Location: [facility] ORS;  Service: Gynecology;  Laterality: N/A;   TEE WITHOUT CARDIOVERSION N/A 09/02/2022   Procedure: TRANSESOPHAGEAL ECHOCARDIOGRAM;  Surgeon: Alleen Borne, MD;  [facility];  Service: Open Heart Surgery;  Laterality: N/A;   TOTAL LAPAROSCOPIC HYSTERECTOMY WITH SALPINGECTOMY Bilateral 09/29/2017   Procedure: TOTAL LAPAROSCOPIC HYSTERECTOMY WITH SALPINGECTOMY;  Surgeon: Vena Austria, MD;  Location: [facility] ORS;  Service: Gynecology;  Laterality: Bilateral;     Home Medications:  Prior to Admission medications   Medication Sig Start Date End Date Taking? Authorizing Provider  albuterol (VENTOLIN HFA) 108 (90 Base) MCG/ACT  inhaler Inhale 2 puffs into the lungs every 6 (six) hours as needed for wheezing or shortness of breath. 06/17/22  Yes Miki Kins, FNP  ALPRAZolam (XANAX XR) 0.5 MG 24 hr tablet Take 1 tablet (0.5 mg total) by mouth 2 (two) times daily as needed for anxiety or sleep. 10/21/22  Yes Miki Kins, FNP  aspirin 81 MG chewable tablet Chew 1 tablet (81 mg total) by mouth daily. 10/12/22  Yes Malayasia Mirkin H, PA-C  atorvastatin (LIPITOR) 40 MG tablet Take 1 tablet (40 mg total) by mouth daily. 10/13/22  Yes Vetra Shinall H, PA-C  cetirizine (ZYRTEC) 10 MG tablet Take 1 tablet (10 mg total) by mouth daily. 06/17/22  Yes Miki Kins, FNP  clopidogrel (PLAVIX) 75 MG tablet Take 1 tablet (75 mg total) by mouth daily. 10/12/22  Yes Evianna Chandran H, PA-C  colchicine 0.6 MG tablet Take 1 tablet (0.6 mg total) by mouth daily. 09/08/22  Yes Stehler, Oren Bracket, PA-C  dapagliflozin propanediol (FARXIGA) 10 MG TABS tablet Take 1 tablet (10 mg total) by mouth  daily before breakfast. 10/12/22  Yes Julita Ozbun H, PA-C  EPIPEN 2-PAK 0.3 MG/0.3ML SOAJ injection INJECT INTO THIGH MUSCLE THROUGH CLOTHES AS NEEDED SEVERE ALLERGIC REACTION 06/17/22  Yes Miki Kins, FNP  fluticasone [facility]) 50 MCG/ACT nasal spray 1 spray by Each Nare route daily. 06/17/22  Yes Miki Kins, FNP  HYDROcodone-acetaminophen (NORCO/VICODIN) 5-325 MG tablet Take 1 tablet by mouth every 6 (six) hours as needed for moderate pain. 11/15/22  Yes Miki Kins, FNP  hydrOXYzine (ATARAX) 25 MG tablet Take 1 tablet (25 mg total) by mouth 3 (three) times daily as needed for anxiety. 10/01/22  Yes Delton Prairie, MD  lidocaine (LIDODERM) 5 % Place 1 patch onto the skin every 12 (twelve) hours. Remove & Discard patch within 12 hours or as directed by MD 10/21/22 10/21/23 Yes Delton Prairie, MD  methimazole (TAPAZOLE) 10 MG tablet Take 1 tablet (10 mg total) by mouth daily. 11/29/22  Yes Miki Kins, FNP  metoprolol succinate (TOPROL XL)  25 MG 24 hr tablet Take 1 tablet (25 mg total) by mouth daily. 11/29/22  Yes Kaid Seeberger H, PA-C  montelukast (SINGULAIR) 10 MG tablet Take 1 tablet (10 mg total) by mouth daily. 06/17/22  Yes Miki Kins, FNP  Multiple Vitamins-Minerals (CENTRUM ADULTS PO) Take 1 tablet by mouth at bedtime.    Yes [provider]  nicotine (NICODERM CQ - DOSED IN MG/24 HOURS) 21 mg/24hr patch Place 1 patch (21 mg total) onto the skin daily. 10/13/22  Yes Lelynd Poer H, PA-C  pantoprazole (PROTONIX) 40 MG tablet Take 1 tablet (40 mg total) by mouth daily. 09/08/22  Yes Alleen Borne, MD  phentermine (ADIPEX-P) 37.5 MG tablet Take 1 tablet (37.5 mg total) by mouth daily before breakfast. Patient not taking: Reported on 10/12/2022 06/17/22   Miki Kins, FNP  spironolactone (ALDACTONE) 25 MG tablet Take 0.5 tablets (12.5 mg total) by mouth daily. Patient not taking: Reported on 12/11/2022 11/29/22 02/27/23  Marianne Sofia, PA-C    Inpatient Medications: Scheduled Meds:  atorvastatin  40 mg Oral Daily   colchicine  0.6 mg Oral Daily   metoprolol succinate  25 mg Oral Daily   nicotine  21 mg Transdermal Daily   spironolactone  12.5 mg Oral Daily   Continuous Infusions:  sodium chloride 125 mL/hr at 12/11/22 0336   PRN Meds: acetaminophen **OR** acetaminophen, ALPRAZolam, EPINEPHrine, haloperidol lactate, HYDROcodone-acetaminophen, hydrOXYzine, iohexol, magnesium hydroxide, morphine injection, ondansetron **OR** ondansetron (ZOFRAN) IV, traZODone  Allergies:    Allergies  Allergen Reactions   Azithromycin Swelling and Other (See Comments)   Clindamycin Swelling   Codeine Hives and Other (See Comments)    Nausea/dizzy Nausea/dizzy   Meloxicam Other (See Comments)    Burns up from the inside    Neurontin [Gabapentin] Other (See Comments)    Makes her pass out and not remember what happened prior to taking med.   Penicillins Anaphylaxis    Patient allergic to all "cillins" Has  patient had a PCN reaction causing immediate rash, facial/tongue/throat swelling, SOB or lightheadedness with hypotension: Yes Has patient had a PCN reaction causing severe rash involving mucus membranes or skin necrosis: Yes Has patient had a PCN reaction that required hospitalization: No Has patient had a PCN reaction occurring within the last 10 years: Yes If all of the above answers are "NO", then may proceed with Cephalosporin use.    Clindamycin/Lincomycin Swelling   Other Hives and Other (See Comments)    Berry flavored  food and drinks   Diphenhydramine Hcl Rash and Other (See Comments)   Doxycycline Rash   Egg-Derived Products Rash    Social History:   Social History   Socioeconomic History   Marital status: Single    Spouse name: Not on file   Number of children: Not on file   Years of education: Not on file   Highest education level: Not on file  Occupational History   Not on file  Tobacco Use   Smoking status: Every Day    Current packs/day: 0.50    Average packs/day: 0.5 packs/day for 18.0 years (9.0 ttl pk-yrs)    Types: Cigarettes   Smokeless tobacco: Never  Vaping Use   Vaping status: Never Used  Substance and Sexual Activity   Alcohol use: Yes    Alcohol/week: 0.0 standard drinks of alcohol    Comment: occasionally   Drug use: Not Currently    Types: Marijuana, Cocaine   Sexual activity: Yes    Partners: Male    Birth control/protection: Surgical    Comment: Hysterectomy  Other Topics Concern   Not on file  Social History Narrative   Not on file   Social Determinants of Health   Financial Resource Strain: Not on file  Food Insecurity: No Food Insecurity (09/01/2022)   Hunger Vital Sign    Worried About Running Out of Food in the Last Year: Never true    Ran Out of Food in the Last Year: Never true  Transportation Needs: No Transportation Needs (09/01/2022)   PRAPARE - Administrator, Civil Service (Medical): No    Lack of  Transportation (Non-Medical): No  Physical Activity: Not on file  Stress: Not on file  Social Connections: Not on file  Intimate Partner Violence: Not At Risk (09/01/2022)   Humiliation, Afraid, Rape, and Kick questionnaire    Fear of Current or Ex-Partner: No    Emotionally Abused: No    Physically Abused: No    Sexually Abused: No    Family History:    Family History  Problem Relation Age of Onset   Hypertension Mother    Alcohol abuse Mother    Deep vein thrombosis Mother    Alcohol abuse Father    COPD Father    Emphysema Father    Heart disease Father      ROS:  Please see the history of present illness.   All other ROS reviewed and negative.     Physical Exam/Data:   Vitals:   12/11/22 0353 12/11/22 0600 12/11/22 0630 12/11/22 1148  BP:   (!) 132/91 103/76  Pulse: (!) 112 (!) 103 (!) 109 (!) 120  Resp:  16 18   Temp:      TempSrc:      SpO2: 93% 94% 100% 95%   No intake or output data in the 24 hours ending 12/11/22 1154    11/29/2022    1:55 PM 10/29/2022    2:48 PM 10/20/2022    9:43 PM  Last 3 Weights  Weight (lbs) 227 lb 9.6 oz 220 lb 12.8 oz 221 lb 12.5 oz  Weight (kg) 103.239 kg 100.154 kg 100.6 kg     There is no height or weight on file to calculate BMI.  General:  Well nourished, well developed, in no acute distress HEENT: normal Neck: no JVD Vascular: No carotid bruits; Distal pulses 2+ bilaterally Cardiac:  normal S1, S2; RRR; no murmur  Lungs:  clear to auscultation bilaterally, no wheezing,  rhonchi or rales  Abd: soft, nontender, no hepatomegaly  Ext: no edema Musculoskeletal:  No deformities, BUE and BLE strength normal and equal Skin: warm and dry  Neuro:  CNs 2-12 intact, no focal abnormalities noted Psych:  Normal affect   EKG:  The EKG was personally reviewed and demonstrates:  ST 125bpm, RAD, nonspecific T wave changes Telemetry:  Telemetry was personally reviewed and demonstrates:  ST HR 110-120  Relevant CV Studies:  Intra-op  TEE 08/2022  Complications: No known complications during this procedure.  POST-OP IMPRESSIONS  _ Left Ventricle: The left ventricle is unchanged from pre-bypass. EF  25-30%  _ Right Ventricle: The right ventricle appears unchanged from pre-bypass.  _ Aorta: The aorta appears unchanged from pre-bypass.  _ Left Atrium: The left atrium appears unchanged from pre-bypass.  _ Left Atrial Appendage: The left atrial appendage appears unchanged from  pre-bypass.  _ Aortic Valve: The aortic valve appears unchanged from pre-bypass.  _ Mitral Valve: The mitral valve appears unchanged from pre-bypass.  _ Tricuspid Valve: The tricuspid valve appears unchanged from pre-bypass.  _ Pulmonic Valve: The pulmonic valve appears unchanged from pre-bypass.  _ Interatrial Septum: The interatrial septum appears unchanged from  pre-bypass.  _ Interventricular Septum: The interventricular septum appears unchanged  from  pre-bypass.  _ Pericardium: The pericardium appears unchanged from pre-bypass.   Echo 08/2022 1. Left ventricular ejection fraction, by estimation, is 30 to 35%. The  left ventricle has moderate to severely decreased function. The left  ventricle demonstrates global hypokinesis. There is mild left ventricular  hypertrophy. Left ventricular  diastolic parameters are consistent with Grade I diastolic dysfunction  (impaired relaxation).   2. Right ventricular systolic function is normal. The right ventricular  size is not well visualized.   3. The mitral valve is normal in structure. Mild mitral valve  regurgitation.   4. The aortic valve was not well visualized. Aortic valve regurgitation  is not visualized.   LHC 08/2022 Conclusions: Severe single-vessel coronary artery disease, with hazy and eccentric 90% ostial LAD stenosis concerning for acute plaque rupture.  Tubular 40% proximal RCA stenosis is also present.  Large LCx without significant disease. Moderately to severely reduced left  ventricular systolic function (LVEF 30-35%) with anterior wall hypokinesis/akinesis.  LVEF 30-35%. Moderately elevated left ventricular filling pressure (LVEDP 25 mmHg).   Recommendations: Critical ostial LAD disease not well-suited for PCI given risk for compromise of LCx and D1.  Images reviewed with interventional cardiology and cardiac surgery teams at [facility]; we will arrange for transfer to [facility] with plans for CABG tomorrow. Restart heparin infusion 2 hours after TR band removal. Titrate NTG infusion for relief of chest pain.  IABP deferred due to patient's inability to lie flat and remain still. Aggressive secondary prevention of coronary artery disease. Obtain echocardiogram.   Yvonne Kendall, MD [facility]  Laboratory Data:  High Sensitivity Troponin:   Recent Labs  Lab 12/10/22 2240  TROPONINIHS 6     Chemistry Recent Labs  Lab 12/10/22 2240 12/11/22 0828  NA 135 139  K 3.7 3.3*  CL 101 104  CO2 20* 24  GLUCOSE 142* 99  BUN 10 9  CREATININE 0.55 0.54  CALCIUM 9.5 8.6*  GFRNONAA >60 >60  ANIONGAP 14 11    Recent Labs  Lab 12/10/22 2240 12/11/22 0828  PROT 7.5 6.5  ALBUMIN 3.7 3.2*  AST 29 14*  ALT 16 14  ALKPHOS 114 95  BILITOT 0.6 0.8   Lipids No  results for input(s): "CHOL", "TRIG", "HDL", "LABVLDL", "LDLCALC", "CHOLHDL" in the last 168 hours.  Hematology Recent Labs  Lab 12/11/22 0100 12/11/22 0828  WBC 16.6* 16.8*  RBC 5.12* 4.94  HGB 14.2 13.6  HCT 43.5 42.4  MCV 85.0 85.8  MCH 27.7 27.5  MCHC 32.6 32.1  RDW 14.6 14.7  PLT 295 282   Thyroid No results for input(s): "TSH", "FREET4" in the last 168 hours.  BNPNo results for input(s): "BNP", "PROBNP" in the last 168 hours.  DDimer No results for input(s): "DDIMER" in the last 168 hours.   Radiology/Studies:  US ABDOMEN LIMITED RUQ (LIVER/GB)  Result Date: 12/10/2022 CLINICAL DATA:  Upper abdominal pain. EXAM: ULTRASOUND ABDOMEN LIMITED RIGHT UPPER QUADRANT COMPARISON:   None Available. FINDINGS: Evaluation is limited due to body habitus and overlying bowel gas. Gallbladder: Multiple stones within the gallbladder. No gallbladder wall thickening or pericholecystic fluid. Positive sonographic Murphy's sign reported. Common bile duct: No biliary ductal dilatation. The common bile duct is not visualized. Liver: There is diffuse increased liver echogenicity most commonly seen in the setting of fatty infiltration. Superimposed inflammation or fibrosis is not excluded. Clinical correlation is recommended. Portal vein is patent on color Doppler imaging with normal direction of blood flow towards the liver. Other: None. IMPRESSION: 1. Cholelithiasis without definite sonographic evidence of acute cholecystitis. A hepatobiliary scintigraphy may provide better evaluation of the gallbladder if there is a high clinical concern for acute cholecystitis . 2. Fatty liver. Electronically Signed   By: Elgie Collard M.D.   On: 12/10/2022 23:55     Assessment and Plan:   Pre-op cardiac evaluation CAD s/p CABG x 1 HFrEF/ICM H/o cocaine use Factor 5 Leiden The patient presented with RUQ abdominal pain, nausea, and vomiting. Plan for cholecystectomy tomorrow. The patient denies chest pain, shortness of breath, LLE. The patient underwent CABG x1 in May, presented as NSTEMI. No stenting peformed at that time. Patient is euvolemic on exam. She is tachycardic, continue BB therapy.  Patient last dose of Plavix 9/6 in the am. METS>4.  According to RCRI she is Class IV risk, 15% 30 day risk of death, MI or cardiac arrest. Ok to hold Plavix prior to surgery.   Regarding ASA therapy, we recommend continuation of ASA throughout the perioperative period.  However, if the surgeon feels that cessation of ASA is required in the perioperative period, it may be stopped 5-7 days prior to surgery with a plan to resume it as soon as felt to be feasible from a surgical standpoint in the post-operative period.    For questions or updates, please contact [facility] Please consult [email or web address] for contact info under    Signed, Zayana Salvador David Stall, PA-C  12/11/2022 11:54 AM

## 2022-12-11 NOTE — Assessment & Plan Note (Signed)
-   We will continue methimazole.

## 2022-12-11 NOTE — ED Notes (Signed)
Light turned off, pt denies other needs at this time.

## 2022-12-11 NOTE — ED Notes (Signed)
Previous RN called lab for blood collect.

## 2022-12-11 NOTE — Progress Notes (Signed)
Patient is seen and examined today morning.   She is admitted for nausea, vomiting and abdominal discomfort. She does have significant history of CAD, polysubstance abuse.  Patient had multiple medications including Haldol, Ativan, IV pain medications before she did get a HIDA scan. HIDA did not show any evidence of acute cholecystitis.  Patient refused to get CT abdomen pelvis even after explaining the need for it. General surgery team on board did discuss in detail with her regarding the current management. I explained her the need for imaging for diagnosis and management accordingly.

## 2022-12-11 NOTE — ED Notes (Signed)
Pt is refusing to wear BP cuff, will take BP later when it is due.

## 2022-12-11 NOTE — ED Notes (Signed)
Delay in giving norco due to waiting on dose of xanax so they can be given at the same time.

## 2022-12-11 NOTE — ED Notes (Signed)
This RN talked with pt at nuclear medicine about continuing scan after telling tech she wanted to stop, pt agreeable to continue at this time.

## 2022-12-11 NOTE — ED Notes (Signed)
Per cardiology give metoprolol.

## 2022-12-11 NOTE — ED Notes (Signed)
Pt agreeable to complete CT at this time. CT called so pt can go before going upstairs.

## 2022-12-11 NOTE — ED Notes (Signed)
Pt back from nuclear medicine pt did not finish testing

## 2022-12-11 NOTE — Consult Note (Signed)
Patient ID: Elizabeth Spencer, female   DOB: 04-26-1985, 37 y.o.   MRN: [id number]  HPI Elizabeth Spencer is a 37 y.o. female seen in consultation at the request of Dr Dolores Frame, d/w her in detail.  She Does have a history of bipolar disorder, coronary artery disease, PTSD, smoker, history of substance abuse, + for cocaine three and 7 months ago. She presents last night with severe upper abdominal pain and right-sided chest wall pain.  Reports the pain is severe constant.  Nonspecific evaluating aggravating factors.  The patient is very irritable.  She states that "she has gallbladder issues for a while and she just needs to have her gallbladder taken out". SHe did have a recent MI non-STEMI that required CABG revascularization LIMA to LAD few months ago.  She is actively on aspirin and Plavix.  He also has CHF with a decreased ejection fraction. History of laparoscopic hysterectomy complicated by vaginal cuff leak. Her C-section. Did have an ultrasound that I personally reviewed showing evidence of gallstones without cholecystitis.  Normal common bile duct.  Normal LFTs normal BMP.  CBC shows a white count that is elevated to 16,000.  HPI  Past Medical History:  Diagnosis Date   Aneurysm (HCC) 01/04/2014   Arthritis    Asthma    WELL CONTROLLED   Brain aneurysm 2007   NEUROLOGY NOTE DOES NOT MENTION ANEURYSM BUT PT STATES SHE DID NOT HAVE TO HAVE SURGERY   Complication of anesthesia    FOR 1 C-SECTION PT WAS ITCHING AND VERY RED ON HER FACE   Cough, persistent 01/27/2016   Dermatitis due to sunburn 11/08/2013   Family history of adverse reaction to anesthesia    brother, neice and nephew got red in face with hives   Gallstones    GERD (gastroesophageal reflux disease)    Gonorrhea 06/20/2020   Headache    CHRONIC HEADACHES   History of chronic cough    DRY   Loss of memory 05/30/2017   Perforation of left tympanic membrane 05/17/2013   Pneumonia    PTSD (post-traumatic stress disorder)    PTSD  (post-traumatic stress disorder)    Thyroid condition    PT WAS JUST TOLD ON 08-25-17 THAT SHE HAS A THYROID PROBLEM AND IS GOING TO F/U WITH ENDOCRINOLOGIST IN 2 WEEKS   Trichomonas vaginalis (TV) infection 06/20/2020    Past Surgical History:  Procedure Laterality Date   ABDOMINAL HYSTERECTOMY     CESAREAN SECTION     x3   CORONARY ARTERY BYPASS GRAFT N/A 09/02/2022   Procedure: CORONARY ARTERY BYPASS GRAFTING (CABG) X 1 USING LEFT INTERNAL MAMMARY ARTERY;  Surgeon: Alleen Borne, MD;  Location: [facility];  Service: Open Heart Surgery;  Laterality: N/A;   CYSTOSCOPY N/A 09/29/2017   Procedure: CYSTOSCOPY;  Surgeon: Vena Austria, MD;  Location: [facility];  Service: Gynecology;  Laterality: N/A;   CYSTOSCOPY N/A 11/05/2017   Procedure: CYSTOSCOPY;  Surgeon: Vena Austria, MD;  Location: [facility];  Service: Gynecology;  Laterality: N/A;   HYSTEROSCOPY WITH D & C N/A 09/01/2017   Procedure: DILATATION AND CURETTAGE /HYSTEROSCOPY;  Surgeon: Vena Austria, MD;  Location: [facility];  Service: Gynecology;  Laterality: N/A;   LAPAROSCOPY N/A 09/01/2017   Procedure: LAPAROSCOPY OPERATIVE with biopsy;  Surgeon: Vena Austria, MD;  Location: [facility];  Service: Gynecology;  Laterality: N/A;   LAPAROSCOPY N/A 11/05/2017   Procedure: LAPAROSCOPY DIAGNOSTIC;  Surgeon: Vena Austria, MD;  Location: [facility];  Service: Gynecology;  Laterality: N/A;  LEFT HEART CATH AND CORONARY ANGIOGRAPHY N/A 09/01/2022   Procedure: LEFT HEART CATH AND CORONARY ANGIOGRAPHY;  Surgeon: Yvonne Kendall, MD;  Location: [facility];  Service: Cardiovascular;  Laterality: N/A;   REPAIR VAGINAL CUFF N/A 11/05/2017   Procedure: REPAIR VAGINAL CUFF;  Surgeon: Vena Austria, MD;  Location: [facility];  Service: Gynecology;  Laterality: N/A;   TEE WITHOUT CARDIOVERSION N/A 09/02/2022   Procedure: TRANSESOPHAGEAL ECHOCARDIOGRAM;  Surgeon: Alleen Borne, MD;  Location: [facility] OR;  Service: Open Heart Surgery;   Laterality: N/A;   TOTAL LAPAROSCOPIC HYSTERECTOMY WITH SALPINGECTOMY Bilateral 09/29/2017   Procedure: TOTAL LAPAROSCOPIC HYSTERECTOMY WITH SALPINGECTOMY;  Surgeon: Vena Austria, MD;  Location: [facility];  Service: Gynecology;  Laterality: Bilateral;    Family History  Problem Relation Age of Onset   Hypertension Mother    Alcohol abuse Mother    Deep vein thrombosis Mother    Alcohol abuse Father    COPD Father    Emphysema Father    Heart disease Father     Social History Social History   Tobacco Use   Smoking status: Every Day    Current packs/day: 0.50    Average packs/day: 0.5 packs/day for 18.0 years (9.0 ttl pk-yrs)    Types: Cigarettes   Smokeless tobacco: Never  Vaping Use   Vaping status: Never Used  Substance Use Topics   Alcohol use: Yes    Alcohol/week: 0.0 standard drinks of alcohol    Comment: occasionally   Drug use: Not Currently    Types: Marijuana, Cocaine    Allergies  Allergen Reactions   Azithromycin Swelling and Other (See Comments)   Clindamycin Swelling   Codeine Hives and Other (See Comments)    Nausea/dizzy Nausea/dizzy   Meloxicam Other (See Comments)    Burns up from the inside    Neurontin [Gabapentin] Other (See Comments)    Makes her pass out and not remember what happened prior to taking med.   Penicillins Anaphylaxis    Patient allergic to all "cillins" Has patient had a PCN reaction causing immediate rash, facial/tongue/throat swelling, SOB or lightheadedness with hypotension: Yes Has patient had a PCN reaction causing severe rash involving mucus membranes or skin necrosis: Yes Has patient had a PCN reaction that required hospitalization: No Has patient had a PCN reaction occurring within the last 10 years: Yes If all of the above answers are "NO", then may proceed with Cephalosporin use.    Clindamycin/Lincomycin Swelling   Other Hives and Other (See Comments)    Berry flavored food and drinks   Diphenhydramine Hcl Rash  and Other (See Comments)   Doxycycline Rash   Egg-Derived Products Rash    Current Facility-Administered Medications  Medication Dose Route Frequency Provider Last Rate Last Admin   0.9 %  sodium chloride infusion   Intravenous Continuous Mansy, Vernetta Honey, MD 125 mL/hr at 12/11/22 0336 New Bag at 12/11/22 0336   acetaminophen (TYLENOL) tablet 650 mg  650 mg Oral Q6H PRN Mansy, Jan A, MD   650 mg at 12/11/22 0272   Or   acetaminophen (TYLENOL) suppository 650 mg  650 mg Rectal Q6H PRN Mansy, Jan A, MD       ALPRAZolam (XANAX XR) 24 hr tablet 0.5 mg  0.5 mg Oral BID PRN Mansy, Jan A, MD   0.5 mg at 12/11/22 0336   atorvastatin (LIPITOR) tablet 40 mg  40 mg Oral Daily Mansy, Jan A, MD       colchicine tablet 0.6  mg  0.6 mg Oral Daily Mansy, Jan A, MD       EPINEPHrine (EPI-PEN) injection 0.3 mg  0.3 mg Subcutaneous Once PRN Mansy, Vernetta Honey, MD       HYDROcodone-acetaminophen (NORCO/VICODIN) 5-325 MG per tablet 1 tablet  1 tablet Oral Q6H PRN Mansy, Vernetta Honey, MD   1 tablet at 12/11/22 2956   HYDROmorphone (DILAUDID) injection 1 mg  1 mg Intravenous Q4H PRN Marcelino Duster, MD       hydrOXYzine (ATARAX) tablet 25 mg  25 mg Oral TID PRN Mansy, Jan A, MD       iohexol (OMNIPAQUE) 300 MG/ML solution 100 mL  100 mL Intravenous Once PRN Shakera Ebrahimi F, MD       magnesium hydroxide (MILK OF MAGNESIA) suspension 30 mL  30 mL Oral Daily PRN Mansy, Jan A, MD       metoprolol succinate (TOPROL-XL) 24 hr tablet 25 mg  25 mg Oral Daily Mansy, Jan A, MD       nicotine (NICODERM CQ - dosed in mg/24 hours) patch 21 mg  21 mg Transdermal Daily Mansy, Jan A, MD       ondansetron [facility]) tablet 4 mg  4 mg Oral Q6H PRN Mansy, Jan A, MD       Or   ondansetron [facility]) injection 4 mg  4 mg Intravenous Q6H PRN Mansy, Jan A, MD   4 mg at 12/11/22 2130   spironolactone (ALDACTONE) tablet 12.5 mg  12.5 mg Oral Daily Mansy, Jan A, MD       traZODone (DESYREL) tablet 25 mg  25 mg Oral QHS PRN Mansy, Vernetta Honey, MD       Current  Outpatient Medications  Medication Sig Dispense Refill   albuterol (VENTOLIN HFA) 108 (90 Base) MCG/ACT inhaler Inhale 2 puffs into the lungs every 6 (six) hours as needed for wheezing or shortness of breath. 1 each 5   ALPRAZolam (XANAX XR) 0.5 MG 24 hr tablet Take 1 tablet (0.5 mg total) by mouth 2 (two) times daily as needed for anxiety or sleep. 60 tablet 1   aspirin 81 MG chewable tablet Chew 1 tablet (81 mg total) by mouth daily. 90 tablet 3   atorvastatin (LIPITOR) 40 MG tablet Take 1 tablet (40 mg total) by mouth daily. 90 tablet 3   cetirizine (ZYRTEC) 10 MG tablet Take 1 tablet (10 mg total) by mouth daily. 90 tablet 1   clopidogrel (PLAVIX) 75 MG tablet Take 1 tablet (75 mg total) by mouth daily. 90 tablet 3   colchicine 0.6 MG tablet Take 1 tablet (0.6 mg total) by mouth daily. 30 tablet 1   dapagliflozin propanediol (FARXIGA) 10 MG TABS tablet Take 1 tablet (10 mg total) by mouth daily before breakfast. 90 tablet 3   EPIPEN 2-PAK 0.3 MG/0.3ML SOAJ injection INJECT INTO THIGH MUSCLE THROUGH CLOTHES AS NEEDED SEVERE ALLERGIC REACTION 1 each 1   fluticasone (FLONASE) 50 MCG/ACT nasal spray 1 spray by Each Nare route daily. 16 g 6   HYDROcodone-acetaminophen (NORCO/VICODIN) 5-325 MG tablet Take 1 tablet by mouth every 6 (six) hours as needed for moderate pain. 20 tablet 0   hydrOXYzine (ATARAX) 25 MG tablet Take 1 tablet (25 mg total) by mouth 3 (three) times daily as needed for anxiety. 30 tablet 1   lidocaine (LIDODERM) 5 % Place 1 patch onto the skin every 12 (twelve) hours. Remove & Discard patch within 12 hours or as directed by MD 10 patch 0  methimazole (TAPAZOLE) 10 MG tablet Take 1 tablet (10 mg total) by mouth daily. 90 tablet 1   metoprolol succinate (TOPROL XL) 25 MG 24 hr tablet Take 1 tablet (25 mg total) by mouth daily. 90 tablet 3   montelukast (SINGULAIR) 10 MG tablet Take 1 tablet (10 mg total) by mouth daily. 90 tablet 1   Multiple Vitamins-Minerals (CENTRUM ADULTS PO)  Take 1 tablet by mouth at bedtime.      nicotine (NICODERM CQ - DOSED IN MG/24 HOURS) 21 mg/24hr patch Place 1 patch (21 mg total) onto the skin daily. (Patient not taking: Reported on 11/29/2022) 28 patch 0   pantoprazole (PROTONIX) 40 MG tablet Take 1 tablet (40 mg total) by mouth daily. 30 tablet 0   phentermine (ADIPEX-P) 37.5 MG tablet Take 1 tablet (37.5 mg total) by mouth daily before breakfast. (Patient not taking: Reported on 10/12/2022) 30 tablet 0   spironolactone (ALDACTONE) 25 MG tablet Take 0.5 tablets (12.5 mg total) by mouth daily. 45 tablet 3     Review of Systems Full ROS  was asked and was negative except for the information on the HPI  Physical Exam Blood pressure (!) 132/91, pulse (!) 109, temperature 98.8 F (37.1 C), temperature source Oral, resp. rate 18, last menstrual period 08/22/2017, SpO2 100%. CONSTITUTIONAL: irritable . EYES: Pupils are equal, round, and reactive to light, Sclera are non-icteric. EARS, NOSE, MOUTH AND THROAT: The oropharynx is clear. The oral mucosa is pink and moist. Hearing is intact to voice. LYMPH NODES:  Lymph nodes in the neck are normal. RESPIRATORY:  Lungs are clear. There is normal respiratory effort, with equal breath sounds bilaterally, and without pathologic use of accessory muscles. CARDIOVASCULAR: Heart is regular without murmurs, gallops, or rubs. Sternotomy scar and sternum is stable , right sided chest wall tenderness GI: The abdomen is  soft, TTP RUQ, equivocal Murphy sign. There are no palpable masses. There is no hepatosplenomegaly. There are normal bowel sounds GU: Rectal deferred.   MUSCULOSKELETAL: Normal muscle strength and tone. No cyanosis or edema.   SKIN: Turgor is good and there are no pathologic skin lesions or ulcers. NEUROLOGIC: Motor and sensation is grossly normal. Cranial nerves are grossly intact. PSYCH:  Oriented to person, place and time. She is anxious and w some flight of ideas  Data Reviewed  I have  personally reviewed the patient's imaging, laboratory findings and medical records.    Assessment/Plan 37 year old female with multiple medical issues most concerning for significant polysubstance abuse and coronary artery disease on dual antiplatelet therapy.  Now presents with nonspecific atypical right-sided chest wall pain and also atypical right upper quadrant pain. I am  Not convinced that she has cholecystitis.  We have ordered a HIDA scan but I anticipate there may be significant challenges due to her agitation and behavior.  I do suspect that she may have either behavior issues related to polysubstance abuse or underlying mental health disease.  She seems to be competent at this time. Also like to get a CT scan of the abdomen pelvis to evaluate for more urgent intra-abdominal pathology.  Again her symptoms are not clear-cut and given everything that she has got we have to obtain more data that is objective. Also discussed with cardiology in detail for potential evaluation in case we need to do a cholecystectomy.  At a good discussion with the patient I am not sure to what extent she was able to actually comprehend.  She understands that I will not be  doing any immediate surgeries until we have a more definitive diagnosis. Greater than 80 minutes in this encounter including personally reviewing medical record extensively, coordinating her care, placing orders, counseling the patient and performing documentation  Sterling Big, MD FACS General Surgeon 12/11/2022, 9:13 AM

## 2022-12-12 ENCOUNTER — Encounter
Admission: EM | Disposition: A | Discharge status: Home / Self Care | Payer: Self-pay | Source: Home / Self Care | Attending: Internal Medicine

## 2022-12-12 ENCOUNTER — Inpatient Hospital Stay: Payer: MEDICAID | Admitting: Anesthesiology

## 2022-12-12 ENCOUNTER — Other Ambulatory Visit: Payer: Self-pay

## 2022-12-12 ENCOUNTER — Inpatient Hospital Stay: Payer: MEDICAID

## 2022-12-12 DIAGNOSIS — K810 Acute cholecystitis: Secondary | ICD-10-CM

## 2022-12-12 DIAGNOSIS — E785 Hyperlipidemia, unspecified: Secondary | ICD-10-CM

## 2022-12-12 DIAGNOSIS — K812 Acute cholecystitis with chronic cholecystitis: Secondary | ICD-10-CM | POA: Diagnosis not present

## 2022-12-12 DIAGNOSIS — I5020 Unspecified systolic (congestive) heart failure: Secondary | ICD-10-CM

## 2022-12-12 DIAGNOSIS — K8050 Calculus of bile duct without cholangitis or cholecystitis without obstruction: Secondary | ICD-10-CM | POA: Diagnosis not present

## 2022-12-12 DIAGNOSIS — F32A Depression, unspecified: Secondary | ICD-10-CM

## 2022-12-12 DIAGNOSIS — E0590 Thyrotoxicosis, unspecified without thyrotoxic crisis or storm: Secondary | ICD-10-CM

## 2022-12-12 DIAGNOSIS — R1010 Upper abdominal pain, unspecified: Secondary | ICD-10-CM

## 2022-12-12 DIAGNOSIS — F419 Anxiety disorder, unspecified: Secondary | ICD-10-CM | POA: Diagnosis not present

## 2022-12-12 DIAGNOSIS — I2510 Atherosclerotic heart disease of native coronary artery without angina pectoris: Secondary | ICD-10-CM

## 2022-12-12 DIAGNOSIS — K8018 Calculus of gallbladder with other cholecystitis without obstruction: Secondary | ICD-10-CM

## 2022-12-12 DIAGNOSIS — I255 Ischemic cardiomyopathy: Secondary | ICD-10-CM

## 2022-12-12 DIAGNOSIS — E669 Obesity, unspecified: Secondary | ICD-10-CM

## 2022-12-12 LAB — URINE DRUG SCREEN, QUALITATIVE (ARMC ONLY)
Amphetamines, Ur Screen: NOT DETECTED
Barbiturates, Ur Screen: NOT DETECTED
Benzodiazepine, Ur Scrn: NOT DETECTED
Cannabinoid 50 Ng, Ur ~~LOC~~: NOT DETECTED
Cocaine Metabolite,Ur ~~LOC~~: NOT DETECTED
MDMA (Ecstasy)Ur Screen: NOT DETECTED
Methadone Scn, Ur: NOT DETECTED
Opiate, Ur Screen: POSITIVE — AB
Phencyclidine (PCP) Ur S: NOT DETECTED
Tricyclic, Ur Screen: NOT DETECTED

## 2022-12-12 LAB — COMPREHENSIVE METABOLIC PANEL
ALT: 11 U/L (ref 0–44)
AST: 14 U/L — ABNORMAL LOW (ref 15–41)
Albumin: 3.1 g/dL — ABNORMAL LOW (ref 3.5–5.0)
Alkaline Phosphatase: 81 U/L (ref 38–126)
Anion gap: 7 (ref 5–15)
BUN: <5 mg/dL — ABNORMAL LOW (ref 6–20)
CO2: 28 mmol/L (ref 22–32)
Calcium: 8.5 mg/dL — ABNORMAL LOW (ref 8.9–10.3)
Chloride: 101 mmol/L (ref 98–111)
Creatinine, Ser: 0.52 mg/dL (ref 0.44–1.00)
GFR, Estimated: >60 mL/min (ref >60)
Glucose, Bld: 105 mg/dL — ABNORMAL HIGH (ref 70–99)
Potassium: 3.9 mmol/L (ref 3.5–5.1)
Sodium: 136 mmol/L (ref 135–145)
Total Bilirubin: 0.7 mg/dL (ref 0.3–1.2)
Total Protein: 6.3 g/dL — ABNORMAL LOW (ref 6.5–8.1)

## 2022-12-12 LAB — URINALYSIS, ROUTINE W REFLEX MICROSCOPIC
Bilirubin Urine: NEGATIVE
Glucose, UA: 150 mg/dL — AB
Hgb urine dipstick: NEGATIVE
Ketones, ur: NEGATIVE mg/dL
Leukocytes,Ua: NEGATIVE
Nitrite: NEGATIVE
Protein, ur: NEGATIVE mg/dL
Specific Gravity, Urine: 1.003 — ABNORMAL LOW (ref 1.005–1.030)
pH: 7 (ref 5.0–8.0)

## 2022-12-12 LAB — CBC
HCT: 39.9 % (ref 36.0–46.0)
Hemoglobin: 12.9 g/dL (ref 12.0–15.0)
MCH: 28.1 pg (ref 26.0–34.0)
MCHC: 32.3 g/dL (ref 30.0–36.0)
MCV: 86.9 fL (ref 80.0–100.0)
Platelets: 236 10*3/uL (ref 150–400)
RBC: 4.59 MIL/uL (ref 3.87–5.11)
RDW: 14.6 % (ref 11.5–15.5)
WBC: 14.3 10*3/uL — ABNORMAL HIGH (ref 4.0–10.5)
nRBC: 0 % (ref 0.0–0.2)

## 2022-12-12 SURGERY — CHOLECYSTECTOMY, ROBOT-ASSISTED, LAPAROSCOPIC
Anesthesia: General | Site: Abdomen

## 2022-12-12 MED ORDER — LACTATED RINGERS IV SOLN
INTRAVENOUS | Status: DC | PRN
Start: 2022-12-12 — End: 2022-12-12

## 2022-12-12 MED ORDER — ONDANSETRON HCL 4 MG/2ML IJ SOLN
INTRAMUSCULAR | Status: DC | PRN
  Administered 2022-12-12: 4 mg via INTRAVENOUS

## 2022-12-12 MED ORDER — DEXAMETHASONE SODIUM PHOSPHATE 10 MG/ML IJ SOLN
INTRAMUSCULAR | Status: AC
  Filled 2022-12-12: qty 1

## 2022-12-12 MED ORDER — BUPIVACAINE LIPOSOME 1.3 % IJ SUSP
INTRAMUSCULAR | Status: AC
  Filled 2022-12-12: qty 20

## 2022-12-12 MED ORDER — PROPOFOL 10 MG/ML IV BOLUS
INTRAVENOUS | Status: DC | PRN
  Administered 2022-12-12: 200 mg via INTRAVENOUS

## 2022-12-12 MED ORDER — SUGAMMADEX SODIUM 500 MG/5ML IV SOLN
INTRAVENOUS | Status: DC | PRN
  Administered 2022-12-12: 400 mg via INTRAVENOUS

## 2022-12-12 MED ORDER — BUPIVACAINE-EPINEPHRINE (PF) 0.25% -1:200000 IJ SOLN
INTRAMUSCULAR | Status: DC | PRN
  Administered 2022-12-12: 50 mL via INTRAMUSCULAR

## 2022-12-12 MED ORDER — DEXAMETHASONE SODIUM PHOSPHATE 10 MG/ML IJ SOLN
INTRAMUSCULAR | Status: DC | PRN
  Administered 2022-12-12: 10 mg via INTRAVENOUS

## 2022-12-12 MED ORDER — FENTANYL CITRATE (PF) 100 MCG/2ML IJ SOLN
INTRAMUSCULAR | Status: AC
  Filled 2022-12-12: qty 2

## 2022-12-12 MED ORDER — ONDANSETRON HCL 4 MG/2ML IJ SOLN
INTRAMUSCULAR | Status: AC
  Filled 2022-12-12: qty 2

## 2022-12-12 MED ORDER — OXYCODONE HCL 5 MG/5ML PO SOLN: 5.0000 mg | Freq: Once | ORAL | Status: DC | PRN

## 2022-12-12 MED ORDER — METRONIDAZOLE 500 MG/100ML IV SOLN
500.0000 mg | Freq: Two times a day (BID) | INTRAVENOUS | Status: DC
  Administered 2022-12-12 – 2022-12-13 (×3): 500 mg via INTRAVENOUS
  Filled 2022-12-12 (×3): qty 100

## 2022-12-12 MED ORDER — KETAMINE HCL 50 MG/5ML IJ SOSY
PREFILLED_SYRINGE | INTRAMUSCULAR | Status: AC
  Filled 2022-12-12: qty 5

## 2022-12-12 MED ORDER — LIDOCAINE HCL (PF) 2 % IJ SOLN
INTRAMUSCULAR | Status: AC
  Filled 2022-12-12: qty 10

## 2022-12-12 MED ORDER — MIDAZOLAM HCL 2 MG/2ML IJ SOLN
INTRAMUSCULAR | Status: AC
  Filled 2022-12-12: qty 2

## 2022-12-12 MED ORDER — DEXMEDETOMIDINE HCL IN NACL 80 MCG/20ML IV SOLN
INTRAVENOUS | Status: DC | PRN
  Administered 2022-12-12 (×5): 8 ug via INTRAVENOUS

## 2022-12-12 MED ORDER — CIPROFLOXACIN IN D5W 400 MG/200ML IV SOLN
400.0000 mg | Freq: Two times a day (BID) | INTRAVENOUS | Status: DC
  Administered 2022-12-12 – 2022-12-13 (×3): 400 mg via INTRAVENOUS
  Filled 2022-12-12 (×3): qty 200

## 2022-12-12 MED ORDER — PROPOFOL 10 MG/ML IV BOLUS
INTRAVENOUS | Status: AC
  Filled 2022-12-12: qty 40

## 2022-12-12 MED ORDER — 0.9 % SODIUM CHLORIDE (POUR BTL) OPTIME
TOPICAL | Status: DC | PRN
  Administered 2022-12-12: 500 mL

## 2022-12-12 MED ORDER — METHIMAZOLE 10 MG PO TABS
10.0000 mg | ORAL_TABLET | Freq: Every day | ORAL | Status: DC
  Administered 2022-12-12 – 2022-12-13 (×2): 10 mg via ORAL
  Filled 2022-12-12 (×2): qty 1

## 2022-12-12 MED ORDER — OXYCODONE HCL 5 MG PO TABS
5.0000 mg | ORAL_TABLET | ORAL | Status: DC | PRN
  Administered 2022-12-12 – 2022-12-13 (×3): 5 mg via ORAL
  Filled 2022-12-12 (×4): qty 1

## 2022-12-12 MED ORDER — FENTANYL CITRATE (PF) 100 MCG/2ML IJ SOLN: 25.0000 ug | INTRAMUSCULAR | Status: DC | PRN

## 2022-12-12 MED ORDER — KETAMINE HCL 10 MG/ML IJ SOLN
INTRAMUSCULAR | Status: DC | PRN
Start: 2022-12-12 — End: 2022-12-12
  Administered 2022-12-12: 50 mg via INTRAVENOUS

## 2022-12-12 MED ORDER — BUPIVACAINE-EPINEPHRINE (PF) 0.25% -1:200000 IJ SOLN
INTRAMUSCULAR | Status: AC
  Filled 2022-12-12: qty 30

## 2022-12-12 MED ORDER — SODIUM CHLORIDE 0.9 % IR SOLN
Status: DC | PRN
  Administered 2022-12-12: 1000 mL

## 2022-12-12 MED ORDER — PHENYLEPHRINE 80 MCG/ML (10ML) SYRINGE FOR IV PUSH (FOR BLOOD PRESSURE SUPPORT)
PREFILLED_SYRINGE | INTRAVENOUS | Status: DC | PRN
  Administered 2022-12-12: 80 ug via INTRAVENOUS
  Administered 2022-12-12: 160 ug via INTRAVENOUS

## 2022-12-12 MED ORDER — INDOCYANINE GREEN 25 MG IV SOLR
2.5000 mg | Freq: Once | INTRAVENOUS | Status: AC
  Administered 2022-12-12: 2.5 mg via INTRAVENOUS
  Filled 2022-12-12: qty 10

## 2022-12-12 MED ORDER — ACETAMINOPHEN 500 MG PO TABS
1000.0000 mg | ORAL_TABLET | Freq: Four times a day (QID) | ORAL | Status: DC
  Administered 2022-12-13 (×2): 1000 mg via ORAL
  Filled 2022-12-12 (×2): qty 2

## 2022-12-12 MED ORDER — FENTANYL CITRATE (PF) 100 MCG/2ML IJ SOLN
INTRAMUSCULAR | Status: DC | PRN
  Administered 2022-12-12: 100 ug via INTRAVENOUS

## 2022-12-12 MED ORDER — MIDAZOLAM HCL 2 MG/2ML IJ SOLN
INTRAMUSCULAR | Status: DC | PRN
  Administered 2022-12-12: 2 mg via INTRAVENOUS

## 2022-12-12 MED ORDER — ROCURONIUM BROMIDE 100 MG/10ML IV SOLN
INTRAVENOUS | Status: DC | PRN
  Administered 2022-12-12: 40 mg via INTRAVENOUS
  Administered 2022-12-12: 60 mg via INTRAVENOUS

## 2022-12-12 MED ORDER — LIDOCAINE HCL (CARDIAC) PF 100 MG/5ML IV SOSY
PREFILLED_SYRINGE | INTRAVENOUS | Status: DC | PRN
  Administered 2022-12-12: 100 mg via INTRAVENOUS

## 2022-12-12 MED ORDER — OXYCODONE HCL 5 MG PO TABS: 5.0000 mg | ORAL_TABLET | Freq: Once | ORAL | Status: DC | PRN

## 2022-12-12 SURGICAL SUPPLY — 54 items
ADH SKN CLS APL DERMABOND .7 (GAUZE/BANDAGES/DRESSINGS) ×2
BULB RESERV EVAC DRAIN JP 100C (MISCELLANEOUS) IMPLANT
CANNULA REDUCER 12-8 DVNC XI (CANNULA) ×2 IMPLANT
CATH REDDICK CHOLANGI 4FR 50CM (CATHETERS) IMPLANT
CAUTERY HOOK MNPLR 1.6 DVNC XI (INSTRUMENTS) ×2 IMPLANT
CLIP LIGATING HEMO O LOK GREEN (MISCELLANEOUS) ×2 IMPLANT
DERMABOND ADVANCED .7 DNX12 (GAUZE/BANDAGES/DRESSINGS) ×2 IMPLANT
DRAIN CHANNEL JP 19F (MISCELLANEOUS) IMPLANT
DRAPE ARM DVNC X/XI (DISPOSABLE) ×8 IMPLANT
DRAPE COLUMN DVNC XI (DISPOSABLE) ×2 IMPLANT
DRSG TEGADERM 4X4.75 (GAUZE/BANDAGES/DRESSINGS) IMPLANT
ELECT CAUTERY BLADE 6.4 (BLADE) ×2 IMPLANT
ELECT REM PT RETURN 9FT ADLT (ELECTROSURGICAL) ×2
ELECTRODE REM PT RTRN 9FT ADLT (ELECTROSURGICAL) ×2 IMPLANT
FORCEPS BPLR R/ABLATION 8 DVNC (INSTRUMENTS) ×2 IMPLANT
FORCEPS PROGRASP DVNC XI (FORCEP) ×2 IMPLANT
GLOVE BIO SURGEON STRL SZ7 (GLOVE) ×4 IMPLANT
GOWN STRL REUS W/ TWL LRG LVL3 (GOWN DISPOSABLE) ×8 IMPLANT
GOWN STRL REUS W/TWL LRG LVL3 (GOWN DISPOSABLE) ×8
IRRIGATION STRYKERFLOW (MISCELLANEOUS) IMPLANT
IRRIGATOR STRYKERFLOW (MISCELLANEOUS) ×2
IV CATH ANGIO 12GX3 LT BLUE (NEEDLE) IMPLANT
IV NS 1000ML (IV SOLUTION) ×2
IV NS 1000ML BAXH (IV SOLUTION) IMPLANT
KIT PINK PAD W/HEAD ARE REST (MISCELLANEOUS) ×2 IMPLANT
KIT PINK PAD W/HEAD ARM REST (MISCELLANEOUS) ×2 IMPLANT
LABEL OR SOLS (LABEL) ×2 IMPLANT
MANIFOLD NEPTUNE II (INSTRUMENTS) ×2 IMPLANT
NDL HYPO 22X1.5 SAFETY MO (MISCELLANEOUS) ×2 IMPLANT
NEEDLE HYPO 22X1.5 SAFETY MO (MISCELLANEOUS) ×2 IMPLANT
NS IRRIG 500ML POUR BTL (IV SOLUTION) ×2 IMPLANT
OBTURATOR OPTICAL STND 8 DVNC (TROCAR) ×2
OBTURATOR OPTICALSTD 8 DVNC (TROCAR) ×2 IMPLANT
PACK LAP CHOLECYSTECTOMY (MISCELLANEOUS) ×2 IMPLANT
PENCIL SMOKE EVACUATOR (MISCELLANEOUS) ×2 IMPLANT
SEAL UNIV 5-12 XI (MISCELLANEOUS) ×8 IMPLANT
SET TUBE SMOKE EVAC HIGH FLOW (TUBING) ×2 IMPLANT
SOL ELECTROSURG ANTI STICK (MISCELLANEOUS) ×2
SOLUTION ELECTROSURG ANTI STCK (MISCELLANEOUS) ×2 IMPLANT
SPIKE FLUID TRANSFER (MISCELLANEOUS) ×2 IMPLANT
SPONGE DRAIN TRACH 4X4 STRL 2S (GAUZE/BANDAGES/DRESSINGS) IMPLANT
SPONGE T-LAP 18X18 ~~LOC~~+RFID (SPONGE) ×2 IMPLANT
SPONGE T-LAP 4X18 ~~LOC~~+RFID (SPONGE) IMPLANT
STOPCOCK 3WAY MALE LL (IV SETS) IMPLANT
SUT ETHILON 3-0 FS-10 30 BLK (SUTURE) ×2
SUT MNCRL AB 4-0 PS2 18 (SUTURE) ×2 IMPLANT
SUT VICRYL 0 UR6 27IN ABS (SUTURE) ×4 IMPLANT
SUTURE EHLN 3-0 FS-10 30 BLK (SUTURE) IMPLANT
SYR 20ML LL LF (SYRINGE) IMPLANT
SYS BAG RETRIEVAL 10MM (BASKET) ×2
SYSTEM BAG RETRIEVAL 10MM (BASKET) ×2 IMPLANT
TRAP FLUID SMOKE EVACUATOR (MISCELLANEOUS) ×2 IMPLANT
WATER STERILE IRR 3000ML UROMA (IV SOLUTION) IMPLANT
WATER STERILE IRR 500ML POUR (IV SOLUTION) ×2 IMPLANT

## 2022-12-12 NOTE — Anesthesia Preprocedure Evaluation (Signed)
Anesthesia Evaluation  Patient identified by MRN, date of birth, ID band Patient awake    Reviewed: Allergy & Precautions, NPO status , Patient's Chart, lab work & pertinent test results  History of Anesthesia Complications Negative for: history of anesthetic complications  Airway Mallampati: III  TM Distance: >3 FB Neck ROM: full    Dental  (+) Poor Dentition   Pulmonary asthma , sleep apnea , Current Smoker   Pulmonary exam normal        Cardiovascular + CAD, + Past MI and + CABG  Normal cardiovascular exam  Per Cardiology  Preop cardiovascular evaluation for gallbladder surgery Acceptable risk for surgery, no further cardiac testing needed Would continue aspirin 81 mg daily, Plavix on hold (she reports last dose was yesterday morning September 6) She has been revascularized with LIMA to the LAD Would moderate IV fluids in the setting of cardiomyopathy ejection fraction 25 to 30%   TEE 5/30 POST-OP IMPRESSIONS  _ Left Ventricle: The left ventricle is unchanged from pre-bypass. EF  25-30%  _ Right Ventricle: The right ventricle appears unchanged from pre-bypass.  _ Aorta: The aorta appears unchanged from pre-bypass.  _ Left Atrium: The left atrium appears unchanged from pre-bypass.  _ Left Atrial Appendage: The left atrial appendage appears unchanged from  pre-bypass.  _ Aortic Valve: The aortic valve appears unchanged from pre-bypass.  _ Mitral Valve: The mitral valve appears unchanged from pre-bypass.  _ Tricuspid Valve: The tricuspid valve appears unchanged from pre-bypass.  _ Pulmonic Valve: The pulmonic valve appears unchanged from pre-bypass.  _ Interatrial Septum: The interatrial septum appears unchanged from  pre-bypass.  _ Interventricular Septum: The interventricular septum appears unchanged  from  pre-bypass.  _ Pericardium: The pericardium appears unchanged from pre-bypass.   PRE-OP FINDINGS   Left  Ventricle: The left ventricle has severely reduced systolic  function, with an ejection fraction of 20-30%. The cavity size was mildly  dilated. Left ventricular diffuse hypokinesis. Severe hypokinesis of the  left ventricular, mid anterior wall.  Severe hypokinesis of the left ventricular, apical apical segment. There  is no left ventricular hypertrophy.    Right Ventricle: The right ventricle has normal systolic function. The  cavity was normal. There is no increase in right ventricular wall  thickness.   Left Atrium: Left atrial size was normal in size. No left atrial/left  atrial appendage thrombus was detected.   Right Atrium: Right atrial size was normal in size.   Interatrial Septum: No atrial level shunt detected by color flow Doppler.   Pericardium: There is no evidence of pericardial effusion.   Mitral Valve: The mitral valve is normal in structure. Mitral valve  regurgitation is mild by color flow Doppler. There is No evidence of  mitral stenosis.   Tricuspid Valve: The tricuspid valve was normal in structure. Tricuspid  valve regurgitation is trivial by color flow Doppler.   Aortic Valve: The aortic valve is tricuspid Aortic valve regurgitation was  not visualized by color flow Doppler. There is no stenosis of the aortic  valve.    Pulmonic Valve: The pulmonic valve was normal in structure.  Pulmonic valve regurgitation is not visualized by color flow Doppler.    Aorta: The aortic root, ascending aorta and aortic arch are normal in size  and structure.         Neuro/Psych  Headaches PSYCHIATRIC DISORDERS Anxiety Depression Bipolar Disorder      GI/Hepatic Neg liver ROS,GERD  Controlled,,  Endo/Other   Hyperthyroidism  Renal/GU      Musculoskeletal   Abdominal   Peds  Hematology negative hematology ROS (+)   Anesthesia Other Findings Past Medical History: 01/04/2014: Aneurysm (HCC) No date: Arthritis No date: Asthma     Comment:  WELL  CONTROLLED 2007: Brain aneurysm     Comment:  NEUROLOGY NOTE DOES NOT MENTION ANEURYSM BUT PT STATES               SHE DID NOT HAVE TO HAVE SURGERY No date: Complication of anesthesia     Comment:  FOR 1 C-SECTION PT WAS ITCHING AND VERY RED ON HER FACE 01/27/2016: Cough, persistent 11/08/2013: Dermatitis due to sunburn No date: Family history of adverse reaction to anesthesia     Comment:  brother, neice and nephew got red in face with hives No date: Gallstones No date: GERD (gastroesophageal reflux disease) 06/20/2020: Gonorrhea No date: Headache     Comment:  CHRONIC HEADACHES No date: History of chronic cough     Comment:  DRY 05/30/2017: Loss of memory 05/17/2013: Perforation of left tympanic membrane No date: Pneumonia No date: PTSD (post-traumatic stress disorder) No date: PTSD (post-traumatic stress disorder) No date: Thyroid condition     Comment:  PT WAS JUST TOLD ON 08-25-17 THAT SHE HAS A THYROID               PROBLEM AND IS GOING TO F/U WITH ENDOCRINOLOGIST IN 2               WEEKS 06/20/2020: Trichomonas vaginalis (TV) infection  Past Surgical History: No date: ABDOMINAL HYSTERECTOMY No date: CESAREAN SECTION     Comment:  x3 09/02/2022: CORONARY ARTERY BYPASS GRAFT; N/A     Comment:  Procedure: CORONARY ARTERY BYPASS GRAFTING (CABG) X 1               USING LEFT INTERNAL MAMMARY ARTERY;  Surgeon: Alleen Borne, MD;  Location: [facility] OR;  Service: Open Heart               Surgery;  Laterality: N/A; 09/29/2017: CYSTOSCOPY; N/A     Comment:  Procedure: CYSTOSCOPY;  Surgeon: Vena Austria, MD;               Location: [facility] ORS;  Service: Gynecology;  Laterality:               N/A; 11/05/2017: CYSTOSCOPY; N/A     Comment:  Procedure: CYSTOSCOPY;  Surgeon: Vena Austria, MD;               Location: [facility];  Service: Gynecology;  Laterality:               N/A; 09/01/2017: HYSTEROSCOPY WITH D & C; N/A     Comment:  Procedure: DILATATION AND CURETTAGE  /HYSTEROSCOPY;                Surgeon: Vena Austria, MD;  Location: [facility];                Service: Gynecology;  Laterality: N/A; 09/01/2017: LAPAROSCOPY; N/A     Comment:  Procedure: LAPAROSCOPY OPERATIVE with biopsy;  Surgeon:               Vena Austria, MD;  Location: [facility];  Service:               Gynecology;  Laterality: N/A; 11/05/2017: LAPAROSCOPY; N/A  Comment:  Procedure: LAPAROSCOPY DIAGNOSTIC;  Surgeon: Vena Austria, MD;  Location: [facility];  Service: Gynecology;                Laterality: N/A; 09/01/2022: LEFT HEART CATH AND CORONARY ANGIOGRAPHY; N/A     Comment:  Procedure: LEFT HEART CATH AND CORONARY ANGIOGRAPHY;                Surgeon: Yvonne Kendall, MD;  Location: [facility]               [facility];  Service: Cardiovascular;  Laterality: N/A; 11/05/2017: REPAIR VAGINAL CUFF; N/A     Comment:  Procedure: REPAIR VAGINAL CUFF;  Surgeon: Vena Austria, MD;  Location: [facility] ORS;  Service: Gynecology;                Laterality: N/A; 09/02/2022: TEE WITHOUT CARDIOVERSION; N/A     Comment:  Procedure: TRANSESOPHAGEAL ECHOCARDIOGRAM;  Surgeon:               Alleen Borne, MD;  Location: [facility] OR;  Service: Open               Heart Surgery;  Laterality: N/A; 09/29/2017: TOTAL LAPAROSCOPIC HYSTERECTOMY WITH SALPINGECTOMY;  Bilateral     Comment:  Procedure: TOTAL LAPAROSCOPIC HYSTERECTOMY WITH               SALPINGECTOMY;  Surgeon: Vena Austria, MD;                Location: [facility] OR[facility];  Service: Gynecology;  Laterality:               Bilateral;     Reproductive/Obstetrics negative OB ROS                             Anesthesia Physical Anesthesia Plan  ASA: 3  Anesthesia Plan: General ETT   Post-op Pain Management: Ofirmev IV (intra-op)* and Toradol IV (intra-op)*   Induction: Intravenous  PONV Risk Score and Plan: 4 or greater and Ondansetron, Dexamethasone, Midazolam and Treatment may vary  due to age or medical condition  Airway Management Planned: Oral ETT  Additional Equipment:   Intra-op Plan:   Post-operative Plan: Extubation in OR  Informed Consent: I have reviewed the patients History and Physical, chart, labs and discussed the procedure including the risks, benefits and alternatives for the proposed anesthesia with the patient or authorized representative who has indicated his/her understanding and acceptance.     Dental Advisory Given  Plan Discussed with: Anesthesiologist, CRNA and Surgeon  Anesthesia Plan Comments: (Patient consented for risks of anesthesia including but not limited to:  - adverse reactions to medications - damage to eyes, teeth, lips or other oral mucosa - nerve damage due to positioning  - sore throat or hoarseness - Damage to heart, brain, nerves, lungs, other parts of body or loss of life  Patient voiced understanding.)        Anesthesia Quick Evaluation

## 2022-12-12 NOTE — Transfer of Care (Signed)
Immediate Anesthesia Transfer of Care Note  Patient: Elizabeth Spencer  Procedure(s) Performed: XI ROBOTIC ASSISTED LAPAROSCOPIC CHOLECYSTECTOMY (Abdomen) INDOCYANINE Fareeda Downard FLUORESCENCE IMAGING (ICG)  Patient Location: PACU  Anesthesia Type:General  Level of Consciousness: drowsy  Airway & Oxygen Therapy: Patient Spontanous Breathing and Patient connected to face mask oxygen  Post-op Assessment: Report given to RN, Post -op Vital signs reviewed and stable, and Patient moving all extremities  Post vital signs: Reviewed and stable  Last Vitals:  Vitals Value Taken Time  BP 103/73 12/12/22 1753  Temp 36.5 C 12/12/22 1753  Pulse 91 12/12/22 1757  Resp 30 12/12/22 1757  SpO2 100 % 12/12/22 1757  Vitals shown include unfiled device data.  Last Pain:  Vitals:   12/12/22 1753  TempSrc: Temporal  PainSc:          Complications: No notable events documented.

## 2022-12-12 NOTE — Anesthesia Postprocedure Evaluation (Signed)
Anesthesia Post Note  Patient: Elizabeth Spencer  Procedure(s) Performed: XI ROBOTIC ASSISTED LAPAROSCOPIC CHOLECYSTECTOMY (Abdomen) INDOCYANINE GREEN FLUORESCENCE IMAGING (ICG)  Patient location during evaluation: PACU Anesthesia Type: General Level of consciousness: awake and alert Pain management: pain level controlled Vital Signs Assessment: post-procedure vital signs reviewed and stable Respiratory status: spontaneous breathing, nonlabored ventilation, respiratory function stable and patient connected to nasal cannula oxygen Cardiovascular status: blood pressure returned to baseline and stable Postop Assessment: no apparent nausea or vomiting Anesthetic complications: no   No notable events documented.   Last Vitals:  Vitals:   12/12/22 1830 12/12/22 1927  BP: 108/70 121/78  Pulse: 93 92  Resp: 20 18  Temp: 36.4 C 36.9 C  SpO2: 92% 92%    Last Pain:  Vitals:   12/12/22 1830  TempSrc:   PainSc: 0-No pain                 Louie Boston

## 2022-12-12 NOTE — Progress Notes (Signed)
Progress Note   Patient: Elizabeth Spencer OJJ:[id number] DOB: 1985/11/23 DOA: 12/10/2022     1 DOS: the patient was seen and examined on 12/12/2022   Brief hospital course: Elizabeth Spencer is a 37 y.o. Caucasian female with medical history significant for GERD, asthma, osteoarthritis, coronary artery disease status post CABG, PTSD, presented for recurrent nausea and vomiting over the last 3 days.  No fever or chills.  No diarrhea or melena or bright red bleeding per rectum.  She had not any bilious vomitus or hematemesis.  No dysuria, oliguria or hematuria or flank pain.   Assessment and Plan: * Cholelithiasis Possible acute cholecystitis CT abdomen/ pelvis reviewed. General surgery team to follow up. Continue pain control. Will give Cipro, flagyl, given multiple antibiotic allergies. Caution with IV fluids given her heart failure.  Coronary artery disease CAD s/p CABG x 1  Continue statin therapy and beta-blocker therapy. Plavix are being held off for potential surgical intervention. Patient is seen by cardiology service who cleared her for surgical intervention if needed.  Heart failure with reduced ejection fraction Ischemic cardiomyopathy Last EF 25 to 30%. Caution with IV fluids given risk of fluid overload. Continue Farxiga, metoprolol.  Asthma, chronic No shortness of breath, no exacerbation. Continue albuterol and Singulair therapy.  Anxiety and depression Continue Xanax and hydroxyzine.  Hyperthyroidism Continue methimazole.  Dyslipidemia Will continue statin therapy.     Subjective: Patient is seen and examined today morning.  She is sitting comfortably.  She is drinking her oral contrast for CT.  Asks for her thyroid medication.  States that she is going for CT scan and would like to go for surgical procedure.  Physical Exam: Vitals:   12/12/22 0033 12/12/22 0333 12/12/22 0830 12/12/22 1119  BP: 117/70 115/78 127/78 (!) 132/92  Pulse: (!) 104 99 (!) 109 (!) 106   Resp:  18 20 18   Temp:  98.2 F (36.8 C) 98.2 F (36.8 C) 97.8 F (36.6 C)  TempSrc:  Oral Oral Oral  SpO2:  98% 97% 100%   General -in good this Caucasian female, no apparent distress HEENT - PERRLA, EOMI, atraumatic head, non tender sinuses. Lung - Clear, rales, rhonchi, wheezes. Heart - S1, S2 heard, no murmurs, rubs, trace pedal edema. Abdomen: Soft, epigastric, RUQ tenderness, bowel sounds good Neuro - Alert, awake and oriented x 3, non focal exam. Skin - Warm and dry. Data Reviewed:     Latest Ref Rng & Units 12/12/2022    4:16 AM 12/11/2022    8:28 AM 12/11/2022    1:00 AM  CBC  WBC 4.0 - 10.5 K/uL 14.3  16.8  16.6   Hemoglobin 12.0 - 15.0 g/dL 93.7  16.9  67.8   Hematocrit 36.0 - 46.0 % 39.9  42.4  43.5   Platelets 150 - 400 K/uL 236  282  295       Latest Ref Rng & Units 12/12/2022    4:16 AM 12/11/2022    8:28 AM 12/10/2022   10:40 PM  BMP  Glucose 70 - 99 mg/dL 938  99  101   BUN 6 - 20 mg/dL 5  9  10    Creatinine 0.44 - 1.00 mg/dL 7.51  0.25  8.52   Sodium 135 - 145 mmol/L 136  139  135   Potassium 3.5 - 5.1 mmol/L 3.9  3.3  3.7   Chloride 98 - 111 mmol/L 101  104  101   CO2 22 - 32 mmol/L 28  24  20  Calcium 8.9 - 10.3 mg/dL 8.5  8.6  9.5    CT ABDOMEN PELVIS W CONTRAST  Result Date: 12/12/2022 CLINICAL DATA:  Generalized abdominal pain. EXAM: CT ABDOMEN AND PELVIS WITH CONTRAST TECHNIQUE: Multidetector CT imaging of the abdomen and pelvis was performed using the standard protocol following bolus administration of intravenous contrast. RADIATION DOSE REDUCTION: This exam was performed according to the departmental dose-optimization program which includes automated exposure control, adjustment of the mA and/or kV according to patient size and/or use of iterative reconstruction technique. CONTRAST:  OMNIPAQUE IOHEXOL 300 MG/ML  SOLN COMPARISON:  CT 11/05/2017, right upper quadrant ultrasound 12/10/2022 and hepatobiliary scan 12/11/2022 FINDINGS: Lower chest:  Sternotomy wires are present. Lung bases are otherwise clear. Hepatobiliary: Moderate cholelithiasis and sludge with findings suggesting gallbladder wall thickening. Liver and biliary tree are normal. Pancreas: Normal. Spleen: Normal. Adrenals/Urinary Tract: Adrenal glands are normal. Kidneys are normal size without hydronephrosis or nephrolithiasis. Stomach/Bowel: Stomach and small bowel are normal. Appendix is normal colon is unremarkable. Vascular/Lymphatic: Abdominal aorta is normal in caliber. No evidence of adenopathy. Reproductive: Previous hysterectomy. Right adnexa is unremarkable. Simple appearing 7.4 cm left ovarian cyst. Other: No significant free fluid. Musculoskeletal: No focal abnormality. IMPRESSION: 1. Moderate cholelithiasis and sludge with findings suggesting gallbladder wall thickening. Findings are concerning for acute cholecystitis, although recent right upper quadrant ultrasound and HIDA scan suggest no evidence of acute cholecystitis. Recommend clinical correlation as repeat right upper quadrant ultrasound may be helpful if symptoms persist/worsen. 2. 7.4 cm simple appearing left ovarian cyst. Recommend follow-up pelvic US in 3-6 months. Electronically Signed   By: Elberta Fortis M.D.   On: 12/12/2022 12:28   NM Hepato W/EF  Result Date: 12/11/2022 CLINICAL DATA:  RUQ abdominal pain, biliary disease suspected, [place] nondiagnostic EXAM: NUCLEAR MEDICINE HEPATOBILIARY IMAGING WITH GALLBLADDER EF TECHNIQUE: Sequential images of the abdomen were obtained out to 60 minutes following intravenous administration of radiopharmaceutical. After oral ingestion of 8 ounces of ensure, gallbladder ejection fraction was determined. RADIOPHARMACEUTICALS:  5.38 mCi Tc-58m Choletec IV COMPARISON:  Abdominal Ultrasound 12/10/22 FINDINGS: Prompt uptake and biliary excretion of activity by the liver is seen. Gallbladder activity is visualized, consistent with patency of cystic duct. Biliary activity passes into small  bowel, consistent with patent common bile duct. Calculated gallbladder ejection fraction is 75%. (Normal gallbladder ejection fraction with half-and-half is greater than 33%.) IMPRESSION: No evidence of acute cholecystitis.  Normal ejection fraction. Electronically Signed   By: Lorenza Cambridge M.D.   On: 12/11/2022 13:20   US ABDOMEN LIMITED RUQ (LIVER/GB)  Result Date: 12/10/2022 CLINICAL DATA:  Upper abdominal pain. EXAM: ULTRASOUND ABDOMEN LIMITED RIGHT UPPER QUADRANT COMPARISON:  None Available. FINDINGS: Evaluation is limited due to body habitus and overlying bowel gas. Gallbladder: Multiple stones within the gallbladder. No gallbladder wall thickening or pericholecystic fluid. Positive sonographic Murphy's sign reported. Common bile duct: No biliary ductal dilatation. The common bile duct is not visualized. Liver: There is diffuse increased liver echogenicity most commonly seen in the setting of fatty infiltration. Superimposed inflammation or fibrosis is not excluded. Clinical correlation is recommended. Portal vein is patent on color Doppler imaging with normal direction of blood flow towards the liver. Other: None. IMPRESSION: 1. Cholelithiasis without definite sonographic evidence of acute cholecystitis. A hepatobiliary scintigraphy may provide better evaluation of the gallbladder if there is a high clinical concern for acute cholecystitis . 2. Fatty liver. Electronically Signed   By: Elgie Collard M.D.   On: 12/10/2022 23:55  Family Communication: Patient understands and agree with above care plan  Disposition: Status is: Inpatient Remains inpatient appropriate because: possible cholecystitis, surgery team follow up.  Planned Discharge Destination: Home    Time spent: 43 minutes  Author: Marcelino Duster, MD 12/12/2022 12:47 PM  For on call review [email or web address].

## 2022-12-12 NOTE — Progress Notes (Addendum)
CC: cholelithiasis  Subjective:  Still having significant right upper quadrant pain.  She completed her CT after agreeing.  CT scan personally reviewed showing evidence of some chronic cholecystitis with pericholecystic fluid and distended gallbladder.  Objective: Vital signs in last 24 hours: Temp:  [97.6 F (36.4 C)-98.2 F (36.8 C)] 97.8 F (36.6 C) (09/08 1119) Pulse Rate:  [99-114] 106 (09/08 1119) Resp:  [18-20] 18 (09/08 1119) BP: (115-132)/(70-94) 132/92 (09/08 1119) SpO2:  [95 %-100 %] 100 % (09/08 1119) Last BM Date : 12/11/22  Intake/Output from previous day: 09/07 0701 - 09/08 0700 In: 1480 [P.O.:480; I.V.:1000] Out: 250 [Urine:250] Intake/Output this shift: No intake/output data recorded.  Physical exam: NAD  Abd: TTP RUQ w equivocal Murphy sign, no peritonitis.  Lab Results: CBC  Recent Labs    12/11/22 0828 12/12/22 0416  WBC 16.8* 14.3*  HGB 13.6 12.9  HCT 42.4 39.9  PLT 282 236   BMET Recent Labs    12/11/22 0828 12/12/22 0416  NA 139 136  K 3.3* 3.9  CL 104 101  CO2 24 28  GLUCOSE 99 105*  BUN 9 <5*  CREATININE 0.54 0.52  CALCIUM 8.6* 8.5*   PT/INR No results for input(s): "LABPROT", "INR" in the last 72 hours. ABG No results for input(s): "PHART", "HCO3" in the last 72 hours.  Invalid input(s): "PCO2", "PO2"  Studies/Results: CT ABDOMEN PELVIS W CONTRAST  Result Date: 12/12/2022 CLINICAL DATA:  Generalized abdominal pain. EXAM: CT ABDOMEN AND PELVIS WITH CONTRAST TECHNIQUE: Multidetector CT imaging of the abdomen and pelvis was performed using the standard protocol following bolus administration of intravenous contrast. RADIATION DOSE REDUCTION: This exam was performed according to the departmental dose-optimization program which includes automated exposure control, adjustment of the mA and/or kV according to patient size and/or use of iterative reconstruction technique. CONTRAST:  OMNIPAQUE IOHEXOL 300 MG/ML  SOLN COMPARISON:   CT 11/05/2017, right upper quadrant ultrasound 12/10/2022 and hepatobiliary scan 12/11/2022 FINDINGS: Lower chest: Sternotomy wires are present. Lung bases are otherwise clear. Hepatobiliary: Moderate cholelithiasis and sludge with findings suggesting gallbladder wall thickening. Liver and biliary tree are normal. Pancreas: Normal. Spleen: Normal. Adrenals/Urinary Tract: Adrenal glands are normal. Kidneys are normal size without hydronephrosis or nephrolithiasis. Stomach/Bowel: Stomach and small bowel are normal. Appendix is normal colon is unremarkable. Vascular/Lymphatic: Abdominal aorta is normal in caliber. No evidence of adenopathy. Reproductive: Previous hysterectomy. Right adnexa is unremarkable. Simple appearing 7.4 cm left ovarian cyst. Other: No significant free fluid. Musculoskeletal: No focal abnormality. IMPRESSION: 1. Moderate cholelithiasis and sludge with findings suggesting gallbladder wall thickening. Findings are concerning for acute cholecystitis, although recent right upper quadrant ultrasound and HIDA scan suggest no evidence of acute cholecystitis. Recommend clinical correlation as repeat right upper quadrant ultrasound may be helpful if symptoms persist/worsen. 2. 7.4 cm simple appearing left ovarian cyst. Recommend follow-up pelvic US in 3-6 months. Electronically Signed   By: Elberta Fortis M.D.   On: 12/12/2022 12:28   NM Hepato W/EF  Result Date: 12/11/2022 CLINICAL DATA:  RUQ abdominal pain, biliary disease suspected, [place] nondiagnostic EXAM: NUCLEAR MEDICINE HEPATOBILIARY IMAGING WITH GALLBLADDER EF TECHNIQUE: Sequential images of the abdomen were obtained out to 60 minutes following intravenous administration of radiopharmaceutical. After oral ingestion of 8 ounces of ensure, gallbladder ejection fraction was determined. RADIOPHARMACEUTICALS:  5.38 mCi Tc-78m Choletec IV COMPARISON:  Abdominal Ultrasound 12/10/22 FINDINGS: Prompt uptake and biliary excretion of activity by the liver is  seen. Gallbladder activity is visualized, consistent with patency of cystic  duct. Biliary activity passes into small bowel, consistent with patent common bile duct. Calculated gallbladder ejection fraction is 75%. (Normal gallbladder ejection fraction with half-and-half is greater than 33%.) IMPRESSION: No evidence of acute cholecystitis.  Normal ejection fraction. Electronically Signed   By: Lorenza Cambridge M.D.   On: 12/11/2022 13:20   US ABDOMEN LIMITED RUQ (LIVER/GB)  Result Date: 12/10/2022 CLINICAL DATA:  Upper abdominal pain. EXAM: ULTRASOUND ABDOMEN LIMITED RIGHT UPPER QUADRANT COMPARISON:  None Available. FINDINGS: Evaluation is limited due to body habitus and overlying bowel gas. Gallbladder: Multiple stones within the gallbladder. No gallbladder wall thickening or pericholecystic fluid. Positive sonographic Murphy's sign reported. Common bile duct: No biliary ductal dilatation. The common bile duct is not visualized. Liver: There is diffuse increased liver echogenicity most commonly seen in the setting of fatty infiltration. Superimposed inflammation or fibrosis is not excluded. Clinical correlation is recommended. Portal vein is patent on color Doppler imaging with normal direction of blood flow towards the liver. Other: None. IMPRESSION: 1. Cholelithiasis without definite sonographic evidence of acute cholecystitis. A hepatobiliary scintigraphy may provide better evaluation of the gallbladder if there is a high clinical concern for acute cholecystitis . 2. Fatty liver. Electronically Signed   By: Elgie Collard M.D.   On: 12/10/2022 23:55    Anti-infectives: Anti-infectives (From admission, onward)    Start     Dose/Rate Route Frequency Ordered Stop   12/12/22 1300  metroNIDAZOLE (FLAGYL) IVPB 500 mg        500 mg 100 mL/hr over 60 Minutes Intravenous 2 times daily 12/12/22 1251     12/12/22 1300  ciprofloxacin (CIPRO) IVPB 400 mg        400 mg 200 mL/hr over 60 Minutes Intravenous Every  12 hours 12/12/22 1251     12/11/22 0200  metroNIDAZOLE (FLAGYL) IVPB 500 mg        500 mg 100 mL/hr over 60 Minutes Intravenous  Once 12/11/22 0148 12/11/22 0400       Assessment/Plan: Right Upper quadrant pain consistent with cholecystitis although HIDA that shows cystic duct patency I do suspect this is more of a chronic process.  The CT-not show any other intra-abdominal pathology.  I had extensive discussion with the patient regarding her disease process and the results.  Options to watch it versus proceed to the operating room for cholecystectomy were discussed with her in detail.  I do think that at some point in time cholecystectomy is indicated and given a cubicle findings I do think it is probably best for her to proceed with cholecystectomy. The risks, benefits, complications, treatment options, and expected outcomes were discussed with the patient. The possibilities of bleeding, recurrent infection, finding a normal gallbladder, perforation of viscus organs, damage to surrounding structures, bile leak, abscess formation, needing a drain placed, the need for additional procedures, reaction to medication, pulmonary aspiration,  failure to diagnose a condition, the possible need to convert to an open procedure, and creating a complication requiring transfusion or operation were discussed with the patient. The patient and/or family concurred with the proposed plan, giving informed consent.  I spent 50 min in this encounter in person reviewing imaging studies, coordinating her care, counseling the patient and performing documentation.   Sterling Big, MD, [facility]  12/12/2022

## 2022-12-12 NOTE — Anesthesia Procedure Notes (Signed)
Procedure Name: Intubation Date/Time: 12/12/2022 4:21 PM  Performed by: Katherine Basset, CRNAPre-anesthesia Checklist: Patient identified, Emergency Drugs available, Suction available and Patient being monitored Patient Re-evaluated:Patient Re-evaluated prior to induction Oxygen Delivery Method: Circle system utilized Preoxygenation: Pre-oxygenation with 100% oxygen Induction Type: IV induction Ventilation: Mask ventilation without difficulty Laryngoscope Size: McGraph and 3 Grade View: Grade I Tube type: Oral Tube size: 7.0 mm Number of attempts: 1 Airway Equipment and Method: Stylet, Oral airway and Bite block Placement Confirmation: ETT inserted through vocal cords under direct vision, positive ETCO2 and breath sounds checked- equal and bilateral Secured at: 22 cm Tube secured with: Tape Dental Injury: Teeth and Oropharynx as per pre-operative assessment

## 2022-12-12 NOTE — Op Note (Signed)
Robotic assisted laparoscopic Cholecystectomy  Pre-operative Diagnosis: Acute on chronic cholecystitis  Post-operative Diagnosis: same  Procedure:  Robotic assisted laparoscopic Cholecystectomy  Surgeon: Sterling Big, MD FACS  Anesthesia: Gen. with endotracheal tube  Findings: Severe acute Cholecystitis   Estimated Blood Loss:10 cc       Specimens: Gallbladder           Complications: none   Procedure Details  The patient was seen again in the Holding Room. The benefits, complications, treatment options, and expected outcomes were discussed with the patient. The risks of bleeding, infection, recurrence of symptoms, failure to resolve symptoms, bile duct damage, bile duct leak, retained common bile duct stone, bowel injury, any of which could require further surgery and/or ERCP, stent, or papillotomy were reviewed with the patient. The likelihood of improving the patient's symptoms with return to their baseline status is good.  The patient and/or family concurred with the proposed plan, giving informed consent.  The patient was taken to Operating Room, identified  and the procedure verified as Laparoscopic Cholecystectomy.  A Time Out was held and the above information confirmed.  Prior to the induction of general anesthesia, antibiotic prophylaxis was administered. VTE prophylaxis was in place. General endotracheal anesthesia was then administered and tolerated well. After the induction, the abdomen was prepped with Chloraprep and draped in the sterile fashion. The patient was positioned in the supine position.  Cut down technique was used to enter the abdominal cavity and a Hasson trochar was placed after two vicryl stitches were anchored to the fascia. Pneumoperitoneum was then created with CO2 and tolerated well without any adverse changes in the patient's vital signs.  Three 8-mm ports were placed under direct vision. All skin incisions  were infiltrated with a local anesthetic agent  before making the incision and placing the trocars.   The patient was positioned  in reverse Trendelenburg, robot was brought to the surgical field and docked in the standard fashion.  We made sure all the instrumentation was kept indirect view at all times and that there were no collision between the arms. I scrubbed out and went to the console.  The gallbladder was identified, It was severely inflamed and distended the fundus grasped and retracted cephalad. Adhesions were lysed bluntly. The infundibulum was grasped and retracted laterally, there was significant inflammatory response around the area with friable GB wall. I exposed the peritoneum overlying the triangle of Calot. I stayed very close to the GB wall during the whole time to prevent CBD injuries. An extended critical view of the cystic duct and cystic artery was obtained.  The cystic duct was clearly identified and bluntly dissected.   Artery and duct were double clipped and divided. Using ICG cholangiography we visualize the cystic duct and CBD w/o evidence of bile injuries. The gallbladder was taken from the gallbladder fossa in a retrograde fashion with the electrocautery.  Hemostasis was achieved with the electrocautery. nspection of the right upper quadrant was performed. No bleeding, bile duct injury or leak, or bowel injury was noted. Robotic instruments and robotic arms were undocked in the standard fashion.  I scrubbed back in. Due to severity and active infection I a 19 Fr blake drain was placed GB bed and secured to the skin The gallbladder was removed and placed in an Endocatch bag.   Pneumoperitoneum was released.  The periumbilical port site was closed with interrumpted 0 Vicryl sutures. 4-0 subcuticular Monocryl was used to close the skin. Dermabond was  applied.  The  patient was then extubated and brought to the recovery room in stable condition. Sponge, lap, and needle counts were correct at closure and at the conclusion of  the case.               Sterling Big, MD, FACS

## 2022-12-13 DIAGNOSIS — E0590 Thyrotoxicosis, unspecified without thyrotoxic crisis or storm: Secondary | ICD-10-CM | POA: Diagnosis not present

## 2022-12-13 DIAGNOSIS — K8050 Calculus of bile duct without cholangitis or cholecystitis without obstruction: Secondary | ICD-10-CM | POA: Diagnosis not present

## 2022-12-13 DIAGNOSIS — K8018 Calculus of gallbladder with other cholecystitis without obstruction: Secondary | ICD-10-CM | POA: Diagnosis not present

## 2022-12-13 DIAGNOSIS — Z9049 Acquired absence of other specified parts of digestive tract: Secondary | ICD-10-CM

## 2022-12-13 DIAGNOSIS — F419 Anxiety disorder, unspecified: Secondary | ICD-10-CM | POA: Diagnosis not present

## 2022-12-13 DIAGNOSIS — Z951 Presence of aortocoronary bypass graft: Secondary | ICD-10-CM

## 2022-12-13 LAB — COMPREHENSIVE METABOLIC PANEL
ALT: 24 U/L (ref 0–44)
AST: 39 U/L (ref 15–41)
Albumin: 3.1 g/dL — ABNORMAL LOW (ref 3.5–5.0)
Alkaline Phosphatase: 80 U/L (ref 38–126)
Anion gap: 7 (ref 5–15)
BUN: <5 mg/dL — ABNORMAL LOW (ref 6–20)
CO2: 28 mmol/L (ref 22–32)
Calcium: 8.9 mg/dL (ref 8.9–10.3)
Chloride: 103 mmol/L (ref 98–111)
Creatinine, Ser: 0.45 mg/dL (ref 0.44–1.00)
GFR, Estimated: >60 mL/min (ref >60)
Glucose, Bld: 120 mg/dL — ABNORMAL HIGH (ref 70–99)
Potassium: 3.7 mmol/L (ref 3.5–5.1)
Sodium: 138 mmol/L (ref 135–145)
Total Bilirubin: 0.6 mg/dL (ref 0.3–1.2)
Total Protein: 6.6 g/dL (ref 6.5–8.1)

## 2022-12-13 LAB — CBC
HCT: 37.8 % (ref 36.0–46.0)
Hemoglobin: 12.5 g/dL (ref 12.0–15.0)
MCH: 28.5 pg (ref 26.0–34.0)
MCHC: 33.1 g/dL (ref 30.0–36.0)
MCV: 86.1 fL (ref 80.0–100.0)
Platelets: 253 10*3/uL (ref 150–400)
RBC: 4.39 MIL/uL (ref 3.87–5.11)
RDW: 14.3 % (ref 11.5–15.5)
WBC: 15.5 10*3/uL — ABNORMAL HIGH (ref 4.0–10.5)
nRBC: 0 % (ref 0.0–0.2)

## 2022-12-13 MED ORDER — CIPROFLOXACIN HCL 500 MG PO TABS: 500.0000 mg | ORAL_TABLET | Freq: Two times a day (BID) | ORAL | 0 refills | Status: AC

## 2022-12-13 MED ORDER — HYDROCODONE-ACETAMINOPHEN 5-325 MG PO TABS: 1.0000 | ORAL_TABLET | Freq: Four times a day (QID) | ORAL | 0 refills | Status: DC | PRN

## 2022-12-13 MED ORDER — METRONIDAZOLE 500 MG PO TABS: 500.0000 mg | ORAL_TABLET | Freq: Three times a day (TID) | ORAL | 0 refills | Status: AC

## 2022-12-13 NOTE — Progress Notes (Signed)
[facility] SURGICAL PROGRESS NOTE  Hospital Day(s): 2.   Post op day(s): 1 Day Post-Op.   Interval History:  Patient seen and examined No acute events or new complaints overnight.  Patient reports she is doing okay; wants to go home Incisional soreness, no fever, chill, nausea, emesis  Leukocytosis to 15.5K; likely reactive  Renal function normal; sCr - 0.45; UO - unmeasured No electrolyte derangements Surgical drain with 95 ccs; serous CLD; tolerating; hungry   Vital signs in last 24 hours: [min-max] current  Temp:  [97.6 F (36.4 C)-98.6 F (37 C)] 97.9 F (36.6 C) (09/09 0441) Pulse Rate:  [89-109] 105 (09/09 0441) Resp:  [18-30] 18 (09/09 0441) BP: (101-132)/(63-92) 126/85 (09/09 0441) SpO2:  [92 %-100 %] 96 % (09/09 0441)             Intake/Output last 2 shifts:  09/08 0701 - 09/09 0700 In: 1520 [P.O.:720; I.V.:500; IV Piggyback:300] Out: 105 [Drains:95; Blood:10]   Physical Exam:  Constitutional: alert, cooperative and no distress  Respiratory: breathing non-labored at rest  Cardiovascular: regular rate and sinus rhythm  Gastrointestinal: soft, incisional soreness, non-distended, no rebound/guarding. Surgical drain in right abdomen; serous Integumentary: laparoscopic incisions are CDI with dermabond, no erythema   Labs:     Latest Ref Rng & Units 12/12/2022    4:16 AM 12/11/2022    8:28 AM 12/11/2022    1:00 AM  CBC  WBC 4.0 - 10.5 K/uL 14.3  16.8  16.6   Hemoglobin 12.0 - 15.0 g/dL 78.2  95.6  21.3   Hematocrit 36.0 - 46.0 % 39.9  42.4  43.5   Platelets 150 - 400 K/uL 236  282  295       Latest Ref Rng & Units 12/13/2022    4:20 AM 12/12/2022    4:16 AM 12/11/2022    8:28 AM  CMP  Glucose 70 - 99 mg/dL 086  578  99   BUN 6 - 20 mg/dL <5  <5  9   Creatinine 0.44 - 1.00 mg/dL 4.69  6.29  5.28   Sodium 135 - 145 mmol/L 138  136  139   Potassium 3.5 - 5.1 mmol/L 3.7  3.9  3.3   Chloride 98 - 111 mmol/L 103  101  104   CO2 22 - 32 mmol/L 28  28   24    Calcium 8.9 - 10.3 mg/dL 8.9  8.5  8.6   Total Protein 6.5 - 8.1 g/dL 6.6  6.3  6.5   Total Bilirubin 0.3 - 1.2 mg/dL 0.6  0.7  0.8   Alkaline Phos 38 - 126 U/L 80  81  95   AST 15 - 41 U/L 39  14  14   ALT 0 - 44 U/L 24  11  14       Imaging studies: No new pertinent imaging studies   Assessment/Plan:  37 y.o. female 1 Day Post-Op s/p robotic assisted laparoscopic cholecystectomy for acute on chronic cholecystitis.   - Okay to advance diet as tolerated; Reviewed recommendations s/p cholecystectomy  - Continue IV Abx (Cipro/Flagyl); limit by allergies but can go similar regimen PO for home x7 days   - Continue surgical drain; she will go home with this. I can see her in ~1 week for removal  - Monitor abdominal examination; on-going bowel function   - Pain control prn; antiemetics prn   - Mobilize - Further management per primary service  - Discharge Planning: Okay for home from  surgical perspective; Abx as above. She will go home with drain. I will see her on 09/12 for removal. Instructions updated   All of the above findings and recommendations were discussed with the patient, and the medical team, and all of patient's questions were answered to her expressed satisfaction.  -- Lynden Oxford, PA-C [facility] 12/13/2022, 7:32 AM M-F: 7am - 4pm

## 2022-12-13 NOTE — Discharge Summary (Signed)
Physician Discharge Summary   Patient: Elizabeth Spencer MRN: [id number] DOB: 04/04/1986  Admit date:     12/10/2022  Discharge date: 12/13/22  Discharge Physician: Marcelino Duster   PCP: Miki Kins, FNP   Recommendations at discharge:    PCP follow up in 1 week. Surgery follow up as suggested. Cardiology follow up as scheduled.  Discharge Diagnoses: Principal Problem:   Cholelithiasis Active Problems:   Pain of upper abdomen   Dyslipidemia   Hyperthyroidism   Anxiety and depression   Asthma, chronic   Coronary artery disease   Biliary colic   Cholecystitis, acute  Resolved Problems:   * No resolved hospital problems. *  Hospital Course: Elizabeth Spencer is a 37 year old woman with history of bipolar affective disorder, chronic pain, hyperlipidemia, coronary artery disease CABG with single-vessel to the LAD, ischemic cardiomyopathy, systolic CHF EF 30 to 35% presenting with right upper quadrant pain, nausea vomiting for the past 3 days.  In the emergency department she was found to have elevated white count, tachycardia.  RUQ sono showed cholelithiasis without cholecystitis.  Patient initially declined HIDA scan and later after multiple IV pain medications, and anxiolytics she got HIDA scan which did not show evidence of acute cholecystitis.  General surgery team evaluated the patient recommended CT abdomen pelvis, cardiology evaluation. Patient is seen by cardiologist for preop in anticipation of possible cholecystectomy and did not recommend any further cardiac workup.  Patient is continued on aspirin, beta-blocker and statin therapy.  Plavix held for surgery. Patient did get CT scan abdomen pelvis after multiple discussions and reassurances.  CT scan showed evidence of cholecystitis with pericholecystic fluid and distended gallbladder.  She is started on Cipro and Flagyl therapy given multiple antibiotic allergies.  General surgery team advised laparoscopic cholecystectomy which  was performed yesterday with drain placement.  She did tolerate the procedure well, pain better controlled, able to tolerate diet and is hemodynamically stable to be discharged home.  New prescription for Cipro, Flagyl and hydrocodone sent to pharmacy.  Patient is advised to continue her cardiac medications, advised to follow-up with PCP, general surgery and cardiology as scheduled.  Patient understands and agrees with the discharge plan.      Consultants: Gen surgery Procedures performed: Robotic-assisted laparoscopic cholecystectomy. Disposition: Home Diet recommendation:  Discharge Diet Orders (From admission, onward)     Start     Ordered   12/13/22 0000  Diet - low sodium heart healthy        12/13/22 1218           Cardiac diet DISCHARGE MEDICATION: Allergies as of 12/13/2022       Reactions   Azithromycin Swelling, Other (See Comments)   Clindamycin Swelling   Codeine Hives, Other (See Comments)   Nausea/dizzy Nausea/dizzy   Meloxicam Other (See Comments)   Burns up from the inside   Neurontin [gabapentin] Other (See Comments)   Makes her pass out and not remember what happened prior to taking med.   Penicillins Anaphylaxis   Patient allergic to all "cillins" Has patient had a PCN reaction causing immediate rash, facial/tongue/throat swelling, SOB or lightheadedness with hypotension: Yes Has patient had a PCN reaction causing severe rash involving mucus membranes or skin necrosis: Yes Has patient had a PCN reaction that required hospitalization: No Has patient had a PCN reaction occurring within the last 10 years: Yes If all of the above answers are "NO", then may proceed with Cephalosporin use.   Clindamycin/lincomycin Swelling   Other Hives, Other (  See Comments)   Allyson Sabal flavored food and drinks   Diphenhydramine Hcl Rash, Other (See Comments)   Doxycycline Rash   Egg-derived Products Rash        Medication List     STOP taking these medications     colchicine 0.6 MG tablet   pantoprazole 40 MG tablet Commonly known as: PROTONIX   phentermine 37.5 MG tablet Commonly known as: ADIPEX-P       TAKE these medications    albuterol 108 (90 Base) MCG/ACT inhaler Commonly known as: VENTOLIN HFA Inhale 2 puffs into the lungs every 6 (six) hours as needed for wheezing or shortness of breath.   ALPRAZolam 0.5 MG 24 hr tablet Commonly known as: XANAX XR Take 1 tablet (0.5 mg total) by mouth 2 (two) times daily as needed for anxiety or sleep.   aspirin 81 MG chewable tablet Chew 1 tablet (81 mg total) by mouth daily.   atorvastatin 40 MG tablet Commonly known as: LIPITOR Take 1 tablet (40 mg total) by mouth daily.   CENTRUM ADULTS PO Take 1 tablet by mouth at bedtime.   cetirizine 10 MG tablet Commonly known as: ZYRTEC Take 1 tablet (10 mg total) by mouth daily.   ciprofloxacin 500 MG tablet Commonly known as: Cipro Take 1 tablet (500 mg total) by mouth 2 (two) times daily for 7 days.   clopidogrel 75 MG tablet Commonly known as: PLAVIX Take 1 tablet (75 mg total) by mouth daily.   dapagliflozin propanediol 10 MG Tabs tablet Commonly known as: Farxiga Take 1 tablet (10 mg total) by mouth daily before breakfast.   EpiPen 2-Pak 0.3 MG/0.3ML Soaj injection Generic drug: EPINEPHrine INJECT INTO THIGH MUSCLE THROUGH CLOTHES AS NEEDED SEVERE ALLERGIC REACTION   fluticasone 50 MCG/ACT nasal spray Commonly known as: FLONASE 1 spray by Each Nare route daily.   HAIR SKIN & NAILS GUMMIES PO Take 1 Units by mouth daily.   HYDROcodone-acetaminophen 5-325 MG tablet Commonly known as: NORCO/VICODIN Take 1 tablet by mouth every 6 (six) hours as needed for moderate pain.   hydrOXYzine 25 MG tablet Commonly known as: ATARAX Take 1 tablet (25 mg total) by mouth 3 (three) times daily as needed for anxiety.   lidocaine 5 % Commonly known as: Lidoderm Place 1 patch onto the skin every 12 (twelve) hours. Remove & Discard  patch within 12 hours or as directed by MD   methimazole 10 MG tablet Commonly known as: TAPAZOLE Take 1 tablet (10 mg total) by mouth daily.   metoprolol succinate 25 MG 24 hr tablet Commonly known as: Toprol XL Take 1 tablet (25 mg total) by mouth daily.   metroNIDAZOLE 500 MG tablet Commonly known as: Flagyl Take 1 tablet (500 mg total) by mouth 3 (three) times daily for 7 days.   montelukast 10 MG tablet Commonly known as: SINGULAIR Take 1 tablet (10 mg total) by mouth daily.   nicotine 21 mg/24hr patch Commonly known as: NICODERM CQ - dosed in mg/24 hours Place 1 patch (21 mg total) onto the skin daily.   omeprazole 20 MG capsule Commonly known as: PRILOSEC Take 20 mg by mouth 2 (two) times daily.   spironolactone 25 MG tablet Commonly known as: ALDACTONE Take 0.5 tablets (12.5 mg total) by mouth daily.        Follow-up Information     Donovan Kail, PA-C. Go on 12/16/2022.   Specialty: Physician Assistant Why: Go to appointment on 09/12 at 130 PM Contact information: [address]  [address] [place] [address] [phone]                Discharge Exam: Today's Vitals   12/13/22 0441 12/13/22 0456 12/13/22 0541 12/13/22 0819  BP: 126/85   111/80  Pulse: (!) 105   89  Resp: 18   (!) 22  Temp: 97.9 F (36.6 C)   97.9 F (36.6 C)  TempSrc:    Oral  SpO2: 96%   95%  PainSc:  6  Asleep    There is no height or weight on file to calculate BMI.  General -Young obese Caucasian female, no apparent distress HEENT - PERRLA, EOMI, atraumatic head, non tender sinuses. Lung - Clear, no rales, rhonchi, wheezes. Heart - S1, S2 heard, no murmurs, rubs, trace pedal edema. Abdomen: Soft, epigastric, laparoscopic incisions clean. RUQ drain noted. Neuro - Alert, awake and oriented x 3, non focal exam. Skin - Warm and dry.  Condition at discharge: stable  The results of significant diagnostics from this hospitalization (including imaging,  microbiology, ancillary and laboratory) are listed below for reference.   Imaging Studies: CT ABDOMEN PELVIS W CONTRAST  Result Date: 12/12/2022 CLINICAL DATA:  Generalized abdominal pain. EXAM: CT ABDOMEN AND PELVIS WITH CONTRAST TECHNIQUE: Multidetector CT imaging of the abdomen and pelvis was performed using the standard protocol following bolus administration of intravenous contrast. RADIATION DOSE REDUCTION: This exam was performed according to the departmental dose-optimization program which includes automated exposure control, adjustment of the mA and/or kV according to patient size and/or use of iterative reconstruction technique. CONTRAST:  OMNIPAQUE IOHEXOL 300 MG/ML  SOLN COMPARISON:  CT 11/05/2017, right upper quadrant ultrasound 12/10/2022 and hepatobiliary scan 12/11/2022 FINDINGS: Lower chest: Sternotomy wires are present. Lung bases are otherwise clear. Hepatobiliary: Moderate cholelithiasis and sludge with findings suggesting gallbladder wall thickening. Liver and biliary tree are normal. Pancreas: Normal. Spleen: Normal. Adrenals/Urinary Tract: Adrenal glands are normal. Kidneys are normal size without hydronephrosis or nephrolithiasis. Stomach/Bowel: Stomach and small bowel are normal. Appendix is normal colon is unremarkable. Vascular/Lymphatic: Abdominal aorta is normal in caliber. No evidence of adenopathy. Reproductive: Previous hysterectomy. Right adnexa is unremarkable. Simple appearing 7.4 cm left ovarian cyst. Other: No significant free fluid. Musculoskeletal: No focal abnormality. IMPRESSION: 1. Moderate cholelithiasis and sludge with findings suggesting gallbladder wall thickening. Findings are concerning for acute cholecystitis, although recent right upper quadrant ultrasound and HIDA scan suggest no evidence of acute cholecystitis. Recommend clinical correlation as repeat right upper quadrant ultrasound may be helpful if symptoms persist/worsen. 2. 7.4 cm simple appearing  left ovarian cyst. Recommend follow-up pelvic US in 3-6 months. Electronically Signed   By: Elberta Fortis M.D.   On: 12/12/2022 12:28   NM Hepato W/EF  Result Date: 12/11/2022 CLINICAL DATA:  RUQ abdominal pain, biliary disease suspected, [place] nondiagnostic EXAM: NUCLEAR MEDICINE HEPATOBILIARY IMAGING WITH GALLBLADDER EF TECHNIQUE: Sequential images of the abdomen were obtained out to 60 minutes following intravenous administration of radiopharmaceutical. After oral ingestion of 8 ounces of ensure, gallbladder ejection fraction was determined. RADIOPHARMACEUTICALS:  5.38 mCi Tc-79m Choletec IV COMPARISON:  Abdominal Ultrasound 12/10/22 FINDINGS: Prompt uptake and biliary excretion of activity by the liver is seen. Gallbladder activity is visualized, consistent with patency of cystic duct. Biliary activity passes into small bowel, consistent with patent common bile duct. Calculated gallbladder ejection fraction is 75%. (Normal gallbladder ejection fraction with half-and-half is greater than 33%.) IMPRESSION: No evidence of acute cholecystitis.  Normal ejection fraction. Electronically Signed   By: Lorenza Cambridge  M.D.   On: 12/11/2022 13:20   US ABDOMEN LIMITED RUQ (LIVER/GB)  Result Date: 12/10/2022 CLINICAL DATA:  Upper abdominal pain. EXAM: ULTRASOUND ABDOMEN LIMITED RIGHT UPPER QUADRANT COMPARISON:  None Available. FINDINGS: Evaluation is limited due to body habitus and overlying bowel gas. Gallbladder: Multiple stones within the gallbladder. No gallbladder wall thickening or pericholecystic fluid. Positive sonographic Murphy's sign reported. Common bile duct: No biliary ductal dilatation. The common bile duct is not visualized. Liver: There is diffuse increased liver echogenicity most commonly seen in the setting of fatty infiltration. Superimposed inflammation or fibrosis is not excluded. Clinical correlation is recommended. Portal vein is patent on color Doppler imaging with normal direction of blood flow  towards the liver. Other: None. IMPRESSION: 1. Cholelithiasis without definite sonographic evidence of acute cholecystitis. A hepatobiliary scintigraphy may provide better evaluation of the gallbladder if there is a high clinical concern for acute cholecystitis . 2. Fatty liver. Electronically Signed   By: Elgie Collard M.D.   On: 12/10/2022 23:55    Microbiology: Results for orders placed or performed during the hospital encounter of 09/01/22  Surgical pcr screen     Status: None   Collection Time: 09/01/22  5:04 PM   Specimen: Nasal Mucosa; Nasal Swab  Result Value Ref Range Status   MRSA, PCR NEGATIVE NEGATIVE Final   Staphylococcus aureus NEGATIVE NEGATIVE Final    Comment: (NOTE) The Xpert SA Assay (FDA approved for NASAL specimens in patients 55 years of age and older), is one component of a comprehensive surveillance program. It is not intended to diagnose infection nor to guide or monitor treatment. Performed at [facility], [address], [place], [place] [address]     Labs: CBC: Recent Labs  Lab 12/11/22 0100 12/11/22 0828 12/12/22 0416 12/13/22 0750  WBC 16.6* 16.8* 14.3* 15.5*  NEUTROABS 13.2*  --   --   --   HGB 14.2 13.6 12.9 12.5  HCT 43.5 42.4 39.9 37.8  MCV 85.0 85.8 86.9 86.1  PLT 295 282 236 253   Basic Metabolic Panel: Recent Labs  Lab 12/10/22 2240 12/11/22 0828 12/12/22 0416 12/13/22 0420  NA 135 139 136 138  K 3.7 3.3* 3.9 3.7  CL 101 104 101 103  CO2 20* 24 28 28   GLUCOSE 142* 99 105* 120*  BUN 10 9 <5* <5*  CREATININE 0.55 0.54 0.52 0.45  CALCIUM 9.5 8.6* 8.5* 8.9   Liver Function Tests: Recent Labs  Lab 12/10/22 2240 12/11/22 0828 12/12/22 0416 12/13/22 0420  AST 29 14* 14* 39  ALT 16 14 11 24   ALKPHOS 114 95 81 80  BILITOT 0.6 0.8 0.7 0.6  PROT 7.5 6.5 6.3* 6.6  ALBUMIN 3.7 3.2* 3.1* 3.1*   CBG: No results for input(s): "GLUCAP" in the last 168 hours.  Discharge time spent: 36 minutes.  Signed: Marcelino Duster, MD [facility] 12/13/2022

## 2022-12-13 NOTE — Plan of Care (Signed)
Pt ambulating to the bathroom independently after assistance with SCD unhooking. Pt has been able to tolerate clear liquid diet without increase pain post eating, no N/V. Pt asking "when can I have real food". Pain managed with PRN meds, see MAR. Encouraged patient to utilize oral pain medication and resort to IV for breakthrough pain.   JP drain OP this far into the shift has been 85ml serosanguinous drainage. Abdominal lap site clean and dry without drainage.    Problem: Education: Goal: Knowledge of General Education information will improve Description: Including pain rating scale, medication(s)/side effects and non-pharmacologic comfort measures Outcome: Progressing   Problem: Health Behavior/Discharge Planning: Goal: Ability to manage health-related needs will improve Outcome: Progressing   Problem: Clinical Measurements: Goal: Ability to maintain clinical measurements within normal limits will improve Outcome: Progressing Goal: Will remain free from infection Outcome: Progressing Goal: Respiratory complications will improve Outcome: Progressing   Problem: Activity: Goal: Risk for activity intolerance will decrease Outcome: Progressing   Problem: Nutrition: Goal: Adequate nutrition will be maintained Outcome: Progressing   Problem: Elimination: Goal: Will not experience complications related to bowel motility Outcome: Progressing

## 2022-12-13 NOTE — Discharge Instructions (Signed)
In addition to included general post-operative instructions,  Diet: Resume home diet. Recommend avoiding or limiting fatty/greasy foods over the next few days/week. If you do eat these, you may (or may not) notice diarrhea. This is expected while your body adjusts to not having a gallbladder, and it typically resolves with time.    Activity: No heavy lifting >20 pounds (children, pets, laundry, garbage) or strenuous activity for 4 weeks, but light activity and walking are encouraged. Do not drive or drink alcohol if taking narcotic pain medications or having pain that might distract from driving.  Wound care: If you can keep drain site covered, you may shower/get incision wet with soapy water and pat dry (do not rub incisions), but no baths or submerging incision underwater until follow-up.   Medications: Resume all home medications. For mild to moderate pain: acetaminophen (Tylenol) or ibuprofen/naproxen (if no kidney disease). Combining Tylenol with alcohol can substantially increase your risk of causing liver disease. Narcotic pain medications, if prescribed, can be used for severe pain, though may cause nausea, constipation, and drowsiness. Do not combine Tylenol and Percocet (or similar) within a 6 hour period as Percocet (and similar) contain(s) Tylenol. If you do not need the narcotic pain medication, you do not need to fill the prescription.  Call office [phone] / [phone]) at any time if any questions, worsening pain, fevers/chills, bleeding, drainage from incision site, or other concerns.

## 2022-12-13 NOTE — Group Note (Deleted)

## 2022-12-15 LAB — SURGICAL PATHOLOGY

## 2022-12-16 ENCOUNTER — Ambulatory Visit (INDEPENDENT_AMBULATORY_CARE_PROVIDER_SITE_OTHER): Payer: MEDICAID | Admitting: Physician Assistant

## 2022-12-16 ENCOUNTER — Encounter: Payer: Self-pay | Admitting: Physician Assistant

## 2022-12-16 VITALS — BP 109/69 | HR 102 | Temp 98.2°F | Ht 67.0 in | Wt 220.0 lb

## 2022-12-16 DIAGNOSIS — Z09 Encounter for follow-up examination after completed treatment for conditions other than malignant neoplasm: Secondary | ICD-10-CM

## 2022-12-16 DIAGNOSIS — K812 Acute cholecystitis with chronic cholecystitis: Secondary | ICD-10-CM

## 2022-12-16 DIAGNOSIS — K810 Acute cholecystitis: Secondary | ICD-10-CM

## 2022-12-16 MED ORDER — ONDANSETRON HCL 4 MG PO TABS: 4.0000 mg | ORAL_TABLET | Freq: Three times a day (TID) | ORAL | 0 refills | Status: DC | PRN

## 2022-12-16 MED ORDER — OXYCODONE HCL 5 MG PO TABS: 5.0000 mg | ORAL_TABLET | Freq: Four times a day (QID) | ORAL | 0 refills | Status: DC | PRN

## 2022-12-16 NOTE — Patient Instructions (Signed)
We have removed your drain today. You may remove the dressing the day after tomorrow. Keep a Band-Aid over the area until it closes up fully. This should only take a few day.  GENERAL POST-OPERATIVE PATIENT INSTRUCTIONS   WOUND CARE INSTRUCTIONS:Try to keep the wound dry and avoid ointments on the wound unless directed to do so.  If the wound becomes bright red and painful or starts to drain infected material that is not clear, please contact your physician immediately.  If the wound is mildly pink and has a thick firm ridge underneath it, this is normal, and is referred to as a healing ridge.  This will resolve over the next 4-6 weeks.  BATHING: You may shower if you have been informed of this by your surgeon. However, Please do not submerge in a tub, hot tub, or pool until incisions are completely sealed or have been told by your surgeon that you may do so.  DIET:  You may eat any foods that you can tolerate.  It is a good idea to eat a high fiber diet and take in plenty of fluids to prevent constipation.  If you do become constipated you may want to take a mild laxative or take ducolax tablets on a daily basis until your bowel habits are regular.  Constipation can be very uncomfortable, along with straining, after recent surgery.  ACTIVITY:  You are encouraged to cough and deep breath or use your incentive spirometer if you were given one, every 15-30 minutes when awake.  This will help prevent respiratory complications and low grade fevers post-operatively if you had a general anesthetic.  You may want to hug a pillow when coughing and sneezing to add additional support to the surgical area, if you had abdominal or chest surgery, which will decrease pain during these times.  You are encouraged to walk and engage in light activity for the next two weeks.  You should not lift more than 20 pounds for 6 weeks total after surgery as it could put you at increased risk for complications.  Twenty pounds is  roughly equivalent to a plastic bag of groceries. At that time- Listen to your body when lifting, if you have pain when lifting, stop and then try again in a few days. Soreness after doing exercises or activities of daily living is normal as you get back in to your normal routine.  MEDICATIONS:  Try to take narcotic medications and anti-inflammatory medications, such as tylenol, ibuprofen, naprosyn, etc., with food.  This will minimize stomach upset from the medication.  Should you develop nausea and vomiting from the pain medication, or develop a rash, please discontinue the medication and contact your physician.  You should not drive, make important decisions, or operate machinery when taking narcotic pain medication.  SUNBLOCK Use sun block to incision area over the next year if this area will be exposed to sun. This helps decrease scarring and will allow you avoid a permanent darkened area over your incision.  QUESTIONS:  Please feel free to call our office if you have any questions, and we will be glad to assist you. [phone]

## 2022-12-16 NOTE — Progress Notes (Signed)
[facility] POST-OP OFFICE VISIT  12/16/2022  HPI: Elizabeth Spencer is a 37 y.o. female 4 days s/p robotic assisted laparoscopic cholecystectomy for acute on chronic cholecystitis with Dr Everlene Farrier.   She reports that she is doing okay Continues to have incisional soreness; expectedly  She reports the hydrocodone is giving her a headache about 45 minutes after she takes this Also reporting nausea; not eating as much No fever, chills, SOB Drain is serosanguinous; <20 ccs Incisions are healing well  She also reports a new onset tremor since surgery; seems mostly in left hand. This is distractible. She denied any recent substance abuse but was "drinking beer because it eased the gallbladder pain."   Vital signs: Ht 5\' 7"  (1.702 m)   Wt 220 lb (99.8 kg)   LMP 08/22/2017 Comment: says she just spots.  BMI 34.46 kg/m    Physical Exam: Constitutional: Well appearing female, NAD Abdomen: Soft, incisional soreness, non-distended, no rebound/guarding. Drain in right lateral port site; serosanguinous (Removed) Skin: Laparoscopic incisions are healing well, no erythema or drainage. Healing ecchymosis to umbilical incision MSK: LUE is tremulous; distractible   Assessment/Plan: This is a 37 y.o. female 4 days s/p robotic assisted laparoscopic cholecystectomy for acute on chronic cholecystitis with Dr Everlene Farrier.    - Stop hydrocodone; will give very short course of 5 mg oxycodone. She will not get another refill from [place]. She was encouraged to trial OTC modalities as well although limited with her comorbidities   - Will send antiemetics; Zofran   - Encouraged her to work up tremor with PCP; may be essential tremor vs withdrawal vs sequela of hyperthyroidism  - Drain removed without issue   - Reviewed wound care recommendation  - Reviewed lifting restrictions; 4 weeks total  - Reviewed surgical pathology; Acute cholecystitis  - She can follow up on as needed basis; She understands to  call with questions/concerns  -- Lynden Oxford, PA-C  [facility] 12/16/2022, 2:00 PM M-F: 7am - 4pm

## 2022-12-17 ENCOUNTER — Telehealth: Payer: Self-pay | Admitting: Medical

## 2022-12-17 NOTE — Telephone Encounter (Signed)
*  STAT* If patient is at the pharmacy, call can be transferred to refill team.   1. Which medications need to be refilled? (please list name of each medication and dose if known) metoprolol tartrate (LOPRESSOR) 25 MG tablet  2. Which pharmacy/location (including street and city if local pharmacy) is medication to be sent to? [address] -  [address] [place], [place], [place] [address]  3. Do they need a 30 day or 90 day supply?  90 day supply

## 2022-12-17 NOTE — Telephone Encounter (Signed)
Hi,  The patient called today requesting refills of metoprolol tartrate 25 mg twice a day. The patient was last seen by Cadence on 11-29-22. During that visit, the patient was taking metoprolol succinate 12.5 mg daily. Cadence increased that dosage to 25 mg once daily. The patient had a visit yesterday with [facility] and metoprolol succinate was taken off her med list and metoprolol tartrate was added. I think I saw the reasoning for removing the medication was that the patient was not taking the medication. I seen in the past around 09/01/2022 that metoprolol tartrate 25 mg BID was discontinued during discharge during an ED visit. Please advise as to which medication the patient should be taking. Thank you so much.

## 2022-12-20 MED ORDER — METOPROLOL SUCCINATE ER 25 MG PO TB24: 25.0000 mg | ORAL_TABLET | Freq: Every day | ORAL | 3 refills | Status: DC

## 2022-12-20 NOTE — Telephone Encounter (Signed)
Pt stated she has not been taking metoprolol because she hasn't had the medication. Nurse sent updated prescription.  Pt also reported she recently had gallbladder surgery and has been experiencing off and on chest pain since. She stated she had a big fight with her boyfriend and developed chest pain. She reported symptoms occurs with any stress. Pt denies symptoms currently.  Appointment scheduled for 12/23/22 for further evaluations. Nurse advised pt to report to ER if symptoms reoccur. Pt verbalized understanding.     Furth, Cadence H, PA-C  You1 hour ago (7:46 AM)    Toprol 25mg  daily.

## 2022-12-22 ENCOUNTER — Telehealth: Payer: Self-pay | Admitting: Family

## 2022-12-22 NOTE — Telephone Encounter (Signed)
Patient called in stating that she believes that she has a blood clot in her arm. She states she was just in the ED for 5 days and doesn't want to go back. Left arm is swollen, hot to the touch, and painful. Has been taking oxycodone but still has pain. She says her mom has Factor 5 and she believes that its a blood clot. Recently had gallbladder removal surgery. Can we order a STAT U/S of the left arm? She can come have the U/S done as latest tomorrow morning.

## 2022-12-22 NOTE — Progress Notes (Deleted)
Cardiology Office Note:  .   Date:  12/22/2022  ID:  Thayer Dallas, DOB December 21, 1985, MRN [id number] PCP: Miki Kins, FNP  [facility] Providers Cardiologist:  None { Click to update primary MD,subspecialty MD or APP then REFRESH:1}   History of Present Illness: .   Tiare Deegan is a 37 y.o. 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  Previously evaluated in May 2024 for chest pain during admission.  She was diagnosed with factor V Leiden and was going to be started on blood thinner.  High-sensitivity troponin peaked at 1163.  Urine drug screen was positive for cocaine.  She was started on IV heparin infusion and was admitted.  She underwent LHC which showed severe single-vessel CAD with hazy eccentric 90% ostial LAD stenosis concerning for acute plaque rupture, tubular 40% proximal RCA stenosis, large circumflex without significant disease.  EF noted to be 30-35%.  Echocardiogram confirmed LVEF of 30-35%, mild LVH, G1 DD, mild MR.  Patient was transferred to [facility] for CABG consideration.  She underwent CABG x 1 utilizing LIMA to LAD.  Postop period was uncomplicated and she was discharged 09/03/2022.  She was last seen in clinic 11/29/2022 still reporting high heart rates causing her not to sleep.  She reported anxiety and musculoskeletal pain in her chest and shoulder.  It also appeared that she has hypothyroidism with an abnormal TSH and free T4 in May 2024.  She was started on spironolactone 12.5 mg daily with a be met in 2 weeks as well is over discussion about repeating her echocardiogram at return.  She presented to [facility] on 12/10/2022 and was admitted with right  upper quadrant pain and nausea vomiting for the past 3 days.  In the emergency department she was found to have elevated white count and was tachycardic.  Right upper quadrant sonogram revealed cholelithiasis without cholecystitis.  Patient initially declined HIDA scan and later after multiple IV pain medications she got her HIDA scan which did not show evidence of acute cholecystitis.  General surgery team evaluated the patient and recommended CT of abdomen and pelvis.  Cardiology was consulted for cardiac clearance if surgery was recommended but there was no further cardiac workup that was needed.  CT scan showed evidence of cholecystitis with Marina Goodell cholecystic fluid and a distended gallbladder.  She was started on Cipro and Flagyl given multiple antibiotic allergies.  General surgery team advised laparoscopic cholecystectomy would need to be performed with drain placement.  She tolerated the procedure well with adequate pain control.  The rest of her hospitalization was uncomplicated and she was considered stable for discharge on 12/13/2022.  She returns to clinic today  ROS: 10 point review of system has been reviewed and considered negative with exception was been listed in HPI.  Studies Reviewed: Marland Kitchen       Intra-op TEE 08/2022 Complications: No known complications during this procedure.  POST-OP IMPRESSIONS  _ Left Ventricle: The left ventricle is unchanged from pre-bypass. EF  25-30%  _ Right Ventricle: The right ventricle appears unchanged from pre-bypass.  _ Aorta: The aorta appears unchanged from pre-bypass.  _ Left Atrium: The left atrium appears unchanged from pre-bypass.  _ Left Atrial Appendage: The left atrial  appendage appears unchanged from  pre-bypass.  _ Aortic Valve: The aortic valve appears unchanged from pre-bypass.  _ Mitral Valve: The mitral valve appears unchanged from pre-bypass.  _ Tricuspid Valve: The tricuspid valve appears unchanged from pre-bypass.  _ Pulmonic Valve:  The pulmonic valve appears unchanged from pre-bypass.  _ Interatrial Septum: The interatrial septum appears unchanged from  pre-bypass.  _ Interventricular Septum: The interventricular septum appears unchanged  from  pre-bypass.  _ Pericardium: The pericardium appears unchanged from pre-bypass.    TTE 09/01/22 1. Left ventricular ejection fraction, by estimation, is 30 to 35%. The  left ventricle has moderate to severely decreased function. The left  ventricle demonstrates global hypokinesis. There is mild left ventricular  hypertrophy. Left ventricular  diastolic parameters are consistent with Grade I diastolic dysfunction  (impaired relaxation).   2. Right ventricular systolic function is normal. The right ventricular  size is not well visualized.   3. The mitral valve is normal in structure. Mild mitral valve  regurgitation.   4. The aortic valve was not well visualized. Aortic valve regurgitation  is not visualized.   LHC 09/01/22 Conclusions: Severe single-vessel coronary artery disease, with hazy and eccentric 90% ostial LAD stenosis concerning for acute plaque rupture.  Tubular 40% proximal RCA stenosis is also present.  Large LCx without significant disease. Moderately to severely reduced left ventricular systolic function (LVEF 30-35%) with anterior wall hypokinesis/akinesis.  LVEF 30-35%. Moderately elevated left ventricular filling pressure (LVEDP 25 mmHg).   Recommendations: Critical ostial LAD disease not well-suited for PCI given risk for compromise of LCx and D1.  Images reviewed with interventional cardiology and cardiac surgery teams at [facility]; we will arrange for transfer to [facility] with plans for CABG tomorrow. Restart heparin infusion 2 hours after TR band removal. Titrate NTG infusion for relief of chest pain.  IABP deferred due to patient's inability to lie flat and remain still. Aggressive secondary prevention of coronary artery disease. Obtain  echocardiogram. Risk Assessment/Calculations:     No BP recorded.  {Refresh Note OR Click here to enter BP  :1}***       Physical Exam:   VS:  LMP 08/22/2017 Comment: says she just spots.   Wt Readings from Last 3 Encounters:  12/16/22 220 lb (99.8 kg)  11/29/22 227 lb 9.6 oz (103.2 kg)  10/29/22 220 lb 12.8 oz (100.2 kg)    GEN: Well nourished, well developed in no acute distress NECK: No JVD; No carotid bruits CARDIAC: ***RRR, no murmurs, rubs, gallops RESPIRATORY:  Clear to auscultation without rales, wheezing or rhonchi  ABDOMEN: Soft, non-tender, non-distended EXTREMITIES:  No edema; No deformity   ASSESSMENT AND PLAN: .   *** {The patient has an active order for outpatient cardiac rehabilitation.   Please indicate if the patient is ready to start. Do NOT delete this.  It will auto delete.  Refresh note, then sign.              Click here to document readiness and see contraindications.  :1}  Cardiac Rehabilitation Eligibility Assessment      {Are you ordering a CV Procedure (e.g. stress test, cath, DCCV, TEE, etc)?   Press F2        :[id number]}  Dispo: ***  Signed, Ersa Delaney, NP

## 2022-12-23 ENCOUNTER — Ambulatory Visit: Payer: MEDICAID | Attending: Cardiology | Admitting: Cardiology

## 2022-12-23 MED ORDER — ALPRAZOLAM ER 0.5 MG PO TB24: 0.5000 mg | ORAL_TABLET | Freq: Two times a day (BID) | ORAL | 1 refills | Status: DC | PRN

## 2022-12-24 ENCOUNTER — Emergency Department: Payer: MEDICAID

## 2022-12-24 ENCOUNTER — Emergency Department
Admission: EM | Admit: 2022-12-24 | Discharge: 2022-12-24 | Disposition: A | Discharge status: Home / Self Care | Payer: MEDICAID | Attending: Emergency Medicine | Admitting: Emergency Medicine

## 2022-12-24 ENCOUNTER — Encounter: Payer: Self-pay | Admitting: Emergency Medicine

## 2022-12-24 ENCOUNTER — Other Ambulatory Visit: Payer: Self-pay

## 2022-12-24 DIAGNOSIS — Z951 Presence of aortocoronary bypass graft: Secondary | ICD-10-CM | POA: Diagnosis not present

## 2022-12-24 DIAGNOSIS — I2510 Atherosclerotic heart disease of native coronary artery without angina pectoris: Secondary | ICD-10-CM | POA: Insufficient documentation

## 2022-12-24 DIAGNOSIS — Z7982 Long term (current) use of aspirin: Secondary | ICD-10-CM | POA: Insufficient documentation

## 2022-12-24 DIAGNOSIS — I82622 Acute embolism and thrombosis of deep veins of left upper extremity: Secondary | ICD-10-CM | POA: Insufficient documentation

## 2022-12-24 DIAGNOSIS — Z7902 Long term (current) use of antithrombotics/antiplatelets: Secondary | ICD-10-CM | POA: Insufficient documentation

## 2022-12-24 DIAGNOSIS — M7989 Other specified soft tissue disorders: Secondary | ICD-10-CM | POA: Diagnosis present

## 2022-12-24 DIAGNOSIS — M79602 Pain in left arm: Secondary | ICD-10-CM

## 2022-12-24 MED ORDER — APIXABAN (ELIQUIS) VTE STARTER PACK (10MG AND 5MG): ORAL_TABLET | ORAL | 0 refills | Status: DC

## 2022-12-24 MED ORDER — APIXABAN 5 MG PO TABS
10.0000 mg | ORAL_TABLET | Freq: Once | ORAL | Status: AC
  Administered 2022-12-24: 10 mg via ORAL
  Filled 2022-12-24: qty 2

## 2022-12-24 MED ORDER — APIXABAN (ELIQUIS) EDUCATION KIT FOR DVT/PE PATIENTS
PACK | Freq: Once | Status: AC
  Filled 2022-12-24: qty 1

## 2022-12-24 NOTE — ED Notes (Signed)
Patient discharged at this time. Ambulated to lobby with independent and steady gait. Breathing unlabored speaking in full sentences. Verbalized understanding of all discharge, follow up, and medication teaching. Discharged homed with all belongings.

## 2022-12-24 NOTE — ED Provider Notes (Signed)
[facility] Provider Note    Event Date/Time   First MD Initiated Contact with Patient 12/24/22 [phone]     (approximate)   History   Arm Pain   HPI  Elizabeth Spencer is a 37 y.o. female who presents to the ED for evaluation of Arm Pain   I reviewed cardiology clinic visit from 1 month ago as well as surgical op note from 2 weeks ago.  Obese patient with history of CAD who had a one-vessel CABG in May, LIMA to LAD.  Subsequent EF 30%, chronic sinus tachycardia frequently with rates in the 110s.  Anxiety.  DAPT with aspirin and Plavix.  Cholecystectomy performed on 9/8 due to cholecystitis, no apparent complications.  Patient presents with left arm swelling and concern for a blood clot.  Denies any acute worsening chest pain or dyspnea.   Physical Exam   Triage Vital Signs: ED Triage Vitals  Encounter Vitals Group     BP 12/24/22 0244 (!) 146/123     Systolic BP Percentile --      Diastolic BP Percentile --      Pulse Rate 12/24/22 0244 (!) 113     Resp 12/24/22 0244 18     Temp 12/24/22 0244 98.5 F (36.9 C)     Temp src --      SpO2 12/24/22 0244 100 %     Weight 12/24/22 0237 220 lb (99.8 kg)     Height 12/24/22 0237 5\' 7"  (1.702 m)     Head Circumference --      Peak Flow --      Pain Score 12/24/22 0237 0     Pain Loc --      Pain Education --      Exclude from Growth Chart --     Most recent vital signs: Vitals:   12/24/22 0244  BP: (!) 146/123  Pulse: (!) 113  Resp: 18  Temp: 98.5 F (36.9 C)  SpO2: 100%    General: Awake, no distress.  CV:  Good peripheral perfusion.  Resp:  Normal effort.  Abd:  No distention.  MSK:  No deformity noted.  Neuro:  No focal deficits appreciated. Other:  Erythema to the left bicep with few inches proximal to previous IV site at the [facility]   ED Results / Procedures / Treatments   Labs (all labs ordered are listed, but only abnormal results are displayed) Labs Reviewed - No data to  display  EKG   RADIOLOGY Venous ultrasound interpreted by me with DVT  Official radiology report(s): US Venous Img Upper Uni Left  Result Date: 12/24/2022 CLINICAL DATA:  Swelling in the left arm EXAM: LEFT UPPER EXTREMITY VENOUS DOPPLER ULTRASOUND TECHNIQUE: Gray-scale sonography with graded compression, as well as color Doppler and duplex ultrasound were performed to evaluate the upper extremity deep venous system from the level of the subclavian vein and including the jugular, axillary, basilic, radial, ulnar and upper cephalic vein. Spectral Doppler was utilized to evaluate flow at rest and with distal augmentation maneuvers. COMPARISON:  None Available. FINDINGS: Contralateral Subclavian Vein: Respiratory phasicity is normal and symmetric with the symptomatic side. No evidence of thrombus. Normal compressibility. Internal Jugular Vein: No evidence of thrombus. Normal compressibility, respiratory phasicity and response to augmentation. Subclavian Vein: No evidence of thrombus. Normal compressibility, respiratory phasicity and response to augmentation. Axillary Vein: No evidence of thrombus. Normal compressibility, respiratory phasicity and response to augmentation. Cephalic Vein: Thrombus is noted with decreased compressibility. This extends  from the antecubital region in an area of previous IV site. Basilic Vein: No evidence of thrombus. Normal compressibility, respiratory phasicity and response to augmentation. Brachial Veins: No evidence of thrombus. Normal compressibility, respiratory phasicity and response to augmentation. Radial Veins: No evidence of thrombus. Normal compressibility, respiratory phasicity and response to augmentation. Ulnar Veins: No evidence of thrombus. Normal compressibility, respiratory phasicity and response to augmentation. Venous Reflux:  None visualized. Other Findings:  None visualized. IMPRESSION: Acute occlusive thrombus in the cephalic vein extending from the  antecubital region to the upper arm. Electronically Signed   By: Alcide Clever M.D.   On: 12/24/2022 03:52    PROCEDURES and INTERVENTIONS:  .Critical Care  Performed by: Delton Prairie, MD Authorized by: Delton Prairie, MD   Critical care provider statement:    Critical care time (minutes):  30   Critical care time was exclusive of:  Separately billable procedures and treating other patients   Critical care was necessary to treat or prevent imminent or life-threatening deterioration of the following conditions:  Circulatory failure   Critical care was time spent personally by me on the following activities:  Development of treatment plan with patient or surrogate, discussions with consultants, evaluation of patient's response to treatment, examination of patient, ordering and review of laboratory studies, ordering and review of radiographic studies, ordering and performing treatments and interventions, pulse oximetry, re-evaluation of patient's condition and review of old charts   Medications  apixaban (ELIQUIS) tablet 10 mg (has no administration in time range)  apixaban (ELIQUIS) Education Kit for DVT/PE patients (has no administration in time range)     IMPRESSION / MDM / ASSESSMENT AND PLAN / ED COURSE  I reviewed the triage vital signs and the nursing notes.  Differential diagnosis includes, but is not limited to, cellulitis, abscess, DVT, SVT  {Patient presents with symptoms of an acute illness or injury that is potentially life-threatening.  Patient on DAPT for known coronary disease presents with left arm pain and evidence of an acute DVT requiring initiation of anticoagulation for triple therapy and subsequent outpatient management.  She is noted to be tachycardic, but this is seemingly chronic for her.  No hypoxia, dyspnea or signs of respiratory failure.  No symptoms of PE.  DVT confirmed on ultrasound and I believe she is suitable for outpatient management with initiation of  anticoagulation.  Long discussion with the patient regarding triple therapy, anticoagulant precautions, hematology follow-up and return precautions for the ED.      FINAL CLINICAL IMPRESSION(S) / ED DIAGNOSES   Final diagnoses:  Left arm pain  Acute deep vein thrombosis (DVT) of other vein of left upper extremity (HCC)     Rx / DC Orders   ED Discharge Orders          Ordered    APIXABAN (ELIQUIS) VTE STARTER PACK (10MG  AND 5MG )       Note to Pharmacy: If starter pack unavailable, substitute with seventy-four 5 mg apixaban tabs following the above SIG directions.   12/24/22 0509    Ambulatory referral to Hematology / Oncology       Comments: Your emergency department provider has referred you to see a hematology/oncology specialist. These are physicians who specialize in blood disorders and cancers, or findings concerning for cancer. You will receive a phone call from the [facility] Office to set up your appointment within 2 business days: [employer] operate Mon - Fri, 8:00 a.m. to 5:00 p.m.; closed for federally recognized holidays. Please be sure  your phone is not set to block numbers during this time.   12/24/22 0514             Note:  This document was prepared using Dragon voice recognition software and may include unintentional dictation errors.   Delton Prairie, MD 12/24/22 [phone]

## 2022-12-24 NOTE — ED Triage Notes (Signed)
Patient ambulatory to triage with steady gait, without difficulty or distress noted; pt reports d/c last Tuesday after having cholecystectomy and has been having left upper arm pain/swelling since; st was concerned that she has a blood clot;

## 2022-12-24 NOTE — Discharge Instructions (Addendum)
The Hematologist (blood doctor) will call you to schedule an appointment to see you in the clinic.   Eliquis blood thinner , twice daily, in addition to all your heart medicines.

## 2022-12-24 NOTE — Telephone Encounter (Signed)
Furth, Cadence H, PA-C  You40 minutes ago (9:07 AM)    Ok, it looks like she went to the ER today.

## 2022-12-24 NOTE — ED Notes (Signed)
See triage note. Pt presented to ED concerned for DVT in L upper arm above prior IV site. Redness noted. Radial pulse palpable. Sensation intact.

## 2022-12-27 ENCOUNTER — Telehealth: Payer: Self-pay | Admitting: Family

## 2022-12-27 NOTE — Telephone Encounter (Signed)
Patient left VM that she went to the ED and they confirmed that she does have a blood clot in her arm. Saturday her arm started hurting even worse after taking her blood thinners. She is wanting to know if this is normal for the pain to be worsening? Please advise. They also referred her to a "blood doctor" she said.

## 2022-12-28 ENCOUNTER — Ambulatory Visit: Payer: MEDICAID | Admitting: Physician Assistant

## 2022-12-28 NOTE — Progress Notes (Deleted)
Celso Amy, PA-C [address]  [address]  [place], [place] [address]  Main: [phone]  Fax: [phone]   Gastroenterology Consultation  Referring Provider:     Miki Kins, FNP Primary Care Physician:  Miki Kins, FNP Primary Gastroenterologist:  *** Reason for Consultation:     Gallstones        HPI:   Elizabeth Spencer is a 37 y.o. y/o female referred for consultation & management  by Miki Kins, FNP.    RUQ ultrasound 12/10/2022, to evaluate upper abdominal pain, showed multiple gallstones.  No evidence of Coley cystitis.  Diffuse hepatic steatosis.  Normal common bile duct.  HIDA scan 12/11/2022 showed normal gallbladder ejection fraction 75%.  No evidence of acute cholecystitis.  CT abdomen pelvis with contrast 12/12/2022: Moderate cholelithiasis and sludge with findings suggesting gallbladder wall thickening.  Concern for acute cholecystitis.  Labs 12/13/2022: Normal LFTs, glucose 120, elevated white count 15.5, normal hemoglobin 12.5.  Past Medical History:  Diagnosis Date   Aneurysm (HCC) 01/04/2014   Arthritis    Asthma    WELL CONTROLLED   Brain aneurysm 2007   NEUROLOGY NOTE DOES NOT MENTION ANEURYSM BUT PT STATES SHE DID NOT HAVE TO HAVE SURGERY   Complication of anesthesia    FOR 1 C-SECTION PT WAS ITCHING AND VERY RED ON HER FACE   Cough, persistent 01/27/2016   Dermatitis due to sunburn 11/08/2013   Family history of adverse reaction to anesthesia    brother, neice and nephew got red in face with hives   Gallstones    GERD (gastroesophageal reflux disease)    Gonorrhea 06/20/2020   Headache    CHRONIC HEADACHES   History of chronic cough    DRY   Loss of memory 05/30/2017   Perforation of left tympanic membrane 05/17/2013   Pneumonia    PTSD (post-traumatic stress disorder)    PTSD (post-traumatic stress disorder)    Thyroid condition    PT WAS JUST TOLD ON 08-25-17 THAT SHE HAS A THYROID PROBLEM AND IS GOING TO F/U WITH  ENDOCRINOLOGIST IN 2 WEEKS   Trichomonas vaginalis (TV) infection 06/20/2020    Past Surgical History:  Procedure Laterality Date   ABDOMINAL HYSTERECTOMY     CESAREAN SECTION     x3   CORONARY ARTERY BYPASS GRAFT N/A 09/02/2022   Procedure: CORONARY ARTERY BYPASS GRAFTING (CABG) X 1 USING LEFT INTERNAL MAMMARY ARTERY;  Surgeon: Alleen Borne, MD;  Location: [facility];  Service: Open Heart Surgery;  Laterality: N/A;   CYSTOSCOPY N/A 09/29/2017   Procedure: CYSTOSCOPY;  Surgeon: Vena Austria, MD;  Location: [facility];  Service: Gynecology;  Laterality: N/A;   CYSTOSCOPY N/A 11/05/2017   Procedure: CYSTOSCOPY;  Surgeon: Vena Austria, MD;  Location: [facility];  Service: Gynecology;  Laterality: N/A;   HYSTEROSCOPY WITH D & C N/A 09/01/2017   Procedure: DILATATION AND CURETTAGE /HYSTEROSCOPY;  Surgeon: Vena Austria, MD;  Location: [facility];  Service: Gynecology;  Laterality: N/A;   LAPAROSCOPY N/A 09/01/2017   Procedure: LAPAROSCOPY OPERATIVE with biopsy;  Surgeon: Vena Austria, MD;  Location: [facility];  Service: Gynecology;  Laterality: N/A;   LAPAROSCOPY N/A 11/05/2017   Procedure: LAPAROSCOPY DIAGNOSTIC;  Surgeon: Vena Austria, MD;  Location: [facility];  Service: Gynecology;  Laterality: N/A;   LEFT HEART CATH AND CORONARY ANGIOGRAPHY N/A 09/01/2022   Procedure: LEFT HEART CATH AND CORONARY ANGIOGRAPHY;  Surgeon: Yvonne Kendall, MD;  Location: [facility];  Service: Cardiovascular;  Laterality: N/A;   REPAIR VAGINAL CUFF N/A 11/05/2017   Procedure: REPAIR VAGINAL CUFF;  Surgeon: Vena Austria, MD;  Location: [facility];  Service: Gynecology;  Laterality: N/A;   TEE WITHOUT CARDIOVERSION N/A 09/02/2022   Procedure: TRANSESOPHAGEAL ECHOCARDIOGRAM;  Surgeon: Alleen Borne, MD;  Location: [facility];  Service: Open Heart Surgery;  Laterality: N/A;   TOTAL LAPAROSCOPIC HYSTERECTOMY WITH SALPINGECTOMY Bilateral 09/29/2017   Procedure: TOTAL LAPAROSCOPIC HYSTERECTOMY WITH  SALPINGECTOMY;  Surgeon: Vena Austria, MD;  Location: [facility];  Service: Gynecology;  Laterality: Bilateral;    Prior to Admission medications   Medication Sig Start Date End Date Taking? Authorizing Provider  albuterol (VENTOLIN HFA) 108 (90 Base) MCG/ACT inhaler Inhale 2 puffs into the lungs every 6 (six) hours as needed for wheezing or shortness of breath. 06/17/22   Miki Kins, FNP  ALPRAZolam (XANAX XR) 0.5 MG 24 hr tablet Take 1 tablet (0.5 mg total) by mouth 2 (two) times daily as needed for anxiety or sleep. 12/23/22   Miki Kins, FNP  APIXABAN Everlene Balls) VTE STARTER PACK (10MG  AND 5MG ) Take as directed on package: start with two-5mg  tablets twice daily for 7 days. On day 8, switch to one-5mg  tablet twice daily. 12/24/22   Delton Prairie, MD  aspirin 81 MG chewable tablet Chew 1 tablet (81 mg total) by mouth daily. 10/12/22   Furth, Cadence H, PA-C  atorvastatin (LIPITOR) 40 MG tablet Take 1 tablet (40 mg total) by mouth daily. 10/13/22   Furth, Cadence H, PA-C  Biotin w/ Vitamins C & E (HAIR SKIN & NAILS GUMMIES PO) Take 1 Units by mouth daily.    [provider]  cetirizine (ZYRTEC) 10 MG tablet Take 1 tablet (10 mg total) by mouth daily. 06/17/22   Miki Kins, FNP  clopidogrel (PLAVIX) 75 MG tablet Take 1 tablet (75 mg total) by mouth daily. 10/12/22   Furth, Cadence H, PA-C  dapagliflozin propanediol (FARXIGA) 10 MG TABS tablet Take 1 tablet (10 mg total) by mouth daily before breakfast. 10/12/22   Furth, Cadence H, PA-C  EPIPEN 2-PAK 0.3 MG/0.3ML SOAJ injection INJECT INTO THIGH MUSCLE THROUGH CLOTHES AS NEEDED SEVERE ALLERGIC REACTION 06/17/22   Miki Kins, FNP  fluticasone (FLONASE) 50 MCG/ACT nasal spray 1 spray by Each Nare route daily. 06/17/22   Miki Kins, FNP  lidocaine (LIDODERM) 5 % Place 1 patch onto the skin every 12 (twelve) hours. Remove & Discard patch within 12 hours or as directed by MD 10/21/22 10/21/23  Delton Prairie, MD  methimazole  (TAPAZOLE) 10 MG tablet Take 10 mg by mouth 3 (three) times daily.    [provider]  metoprolol succinate (TOPROL-XL) 25 MG 24 hr tablet Take 1 tablet (25 mg total) by mouth daily. Take with or immediately following a meal. 12/20/22 03/20/23  Furth, Cadence H, PA-C  montelukast (SINGULAIR) 10 MG tablet Take 1 tablet (10 mg total) by mouth daily. 06/17/22   Miki Kins, FNP  nicotine (NICODERM CQ - DOSED IN MG/24 HOURS) 21 mg/24hr patch Place 1 patch (21 mg total) onto the skin daily. 10/13/22   Furth, Cadence H, PA-C  omeprazole (PRILOSEC) 20 MG capsule Take 20 mg by mouth 2 (two) times daily. 12/08/22   [provider]  ondansetron (ZOFRAN) 4 MG tablet Take 1 tablet (4 mg total) by mouth every 8 (eight) hours as needed for nausea or vomiting. 12/16/22   Donovan Kail, PA-C  oxyCODONE (OXY IR/ROXICODONE) 5 MG immediate release  tablet Take 1 tablet (5 mg total) by mouth every 6 (six) hours as needed for breakthrough pain. 12/16/22   Donovan Kail, PA-C  spironolactone (ALDACTONE) 25 MG tablet Take 0.5 tablets (12.5 mg total) by mouth daily. 11/29/22 02/27/23  Furth, Cadence H, PA-C    Family History  Problem Relation Age of Onset   Hypertension Mother    Alcohol abuse Mother    Deep vein thrombosis Mother    Alcohol abuse Father    COPD Father    Emphysema Father    Heart disease Father      Social History   Tobacco Use   Smoking status: Every Day    Current packs/day: 0.50    Average packs/day: 0.5 packs/day for 18.0 years (9.0 ttl pk-yrs)    Types: Cigarettes    Passive exposure: Past   Smokeless tobacco: Never  Vaping Use   Vaping status: Never Used  Substance Use Topics   Alcohol use: Yes    Alcohol/week: 0.0 standard drinks of alcohol    Comment: occasionally   Drug use: Not Currently    Types: Marijuana, Cocaine    Allergies as of 12/28/2022 - Unable to Assess 12/24/2022  Allergen Reaction Noted   Azithromycin Swelling and Other (See Comments)  11/24/2012   Clindamycin Swelling 09/29/2017   Codeine Hives and Other (See Comments) 07/28/2012   Meloxicam Other (See Comments) 10/13/2015   Neurontin [gabapentin] Other (See Comments) 07/10/2015   Penicillins Anaphylaxis 10/18/2014   Clindamycin/lincomycin Swelling 09/29/2017   Other Hives and Other (See Comments) 05/01/2015   Diphenhydramine hcl Rash and Other (See Comments) 07/28/2012   Doxycycline Rash 03/12/2015   Egg-derived products Rash 09/01/2022    Review of Systems:    All systems reviewed and negative except where noted in HPI.   Physical Exam:  LMP 08/22/2017 Comment: says she just spots. Patient's last menstrual period was 08/22/2017.  General:   Alert,  Well-developed, well-nourished, pleasant and cooperative in NAD Lungs:  Respirations even and unlabored.  Clear throughout to auscultation.   No wheezes, crackles, or rhonchi. No acute distress. Heart:  Regular rate and rhythm; no murmurs, clicks, rubs, or gallops. Abdomen:  Normal bowel sounds.  No bruits.  Soft, and non-distended without masses, hepatosplenomegaly or hernias noted.  No Tenderness.  No guarding or rebound tenderness.    Neurologic:  Alert and oriented x3;  grossly normal neurologically. Psych:  Alert and cooperative. Normal mood and affect.  Imaging Studies: US Venous Img Upper Uni Left  Result Date: 12/24/2022 CLINICAL DATA:  Swelling in the left arm EXAM: LEFT UPPER EXTREMITY VENOUS DOPPLER ULTRASOUND TECHNIQUE: Gray-scale sonography with graded compression, as well as color Doppler and duplex ultrasound were performed to evaluate the upper extremity deep venous system from the level of the subclavian vein and including the jugular, axillary, basilic, radial, ulnar and upper cephalic vein. Spectral Doppler was utilized to evaluate flow at rest and with distal augmentation maneuvers. COMPARISON:  None Available. FINDINGS: Contralateral Subclavian Vein: Respiratory phasicity is normal and symmetric with  the symptomatic side. No evidence of thrombus. Normal compressibility. Internal Jugular Vein: No evidence of thrombus. Normal compressibility, respiratory phasicity and response to augmentation. Subclavian Vein: No evidence of thrombus. Normal compressibility, respiratory phasicity and response to augmentation. Axillary Vein: No evidence of thrombus. Normal compressibility, respiratory phasicity and response to augmentation. Cephalic Vein: Thrombus is noted with decreased compressibility. This extends from the antecubital region in an area of previous IV site. Basilic Vein: No evidence of thrombus.  Normal compressibility, respiratory phasicity and response to augmentation. Brachial Veins: No evidence of thrombus. Normal compressibility, respiratory phasicity and response to augmentation. Radial Veins: No evidence of thrombus. Normal compressibility, respiratory phasicity and response to augmentation. Ulnar Veins: No evidence of thrombus. Normal compressibility, respiratory phasicity and response to augmentation. Venous Reflux:  None visualized. Other Findings:  None visualized. IMPRESSION: Acute occlusive thrombus in the cephalic vein extending from the antecubital region to the upper arm. Electronically Signed   By: Alcide Clever M.D.   On: 12/24/2022 03:52   CT ABDOMEN PELVIS W CONTRAST  Result Date: 12/12/2022 CLINICAL DATA:  Generalized abdominal pain. EXAM: CT ABDOMEN AND PELVIS WITH CONTRAST TECHNIQUE: Multidetector CT imaging of the abdomen and pelvis was performed using the standard protocol following bolus administration of intravenous contrast. RADIATION DOSE REDUCTION: This exam was performed according to the departmental dose-optimization program which includes automated exposure control, adjustment of the mA and/or kV according to patient size and/or use of iterative reconstruction technique. CONTRAST:  OMNIPAQUE IOHEXOL 300 MG/ML  SOLN COMPARISON:  CT 11/05/2017, right upper quadrant ultrasound  12/10/2022 and hepatobiliary scan 12/11/2022 FINDINGS: Lower chest: Sternotomy wires are present. Lung bases are otherwise clear. Hepatobiliary: Moderate cholelithiasis and sludge with findings suggesting gallbladder wall thickening. Liver and biliary tree are normal. Pancreas: Normal. Spleen: Normal. Adrenals/Urinary Tract: Adrenal glands are normal. Kidneys are normal size without hydronephrosis or nephrolithiasis. Stomach/Bowel: Stomach and small bowel are normal. Appendix is normal colon is unremarkable. Vascular/Lymphatic: Abdominal aorta is normal in caliber. No evidence of adenopathy. Reproductive: Previous hysterectomy. Right adnexa is unremarkable. Simple appearing 7.4 cm left ovarian cyst. Other: No significant free fluid. Musculoskeletal: No focal abnormality. IMPRESSION: 1. Moderate cholelithiasis and sludge with findings suggesting gallbladder wall thickening. Findings are concerning for acute cholecystitis, although recent right upper quadrant ultrasound and HIDA scan suggest no evidence of acute cholecystitis. Recommend clinical correlation as repeat right upper quadrant ultrasound may be helpful if symptoms persist/worsen. 2. 7.4 cm simple appearing left ovarian cyst. Recommend follow-up pelvic US in 3-6 months. Electronically Signed   By: Elberta Fortis M.D.   On: 12/12/2022 12:28   NM Hepato W/EF  Result Date: 12/11/2022 CLINICAL DATA:  RUQ abdominal pain, biliary disease suspected, [place] nondiagnostic EXAM: NUCLEAR MEDICINE HEPATOBILIARY IMAGING WITH GALLBLADDER EF TECHNIQUE: Sequential images of the abdomen were obtained out to 60 minutes following intravenous administration of radiopharmaceutical. After oral ingestion of 8 ounces of ensure, gallbladder ejection fraction was determined. RADIOPHARMACEUTICALS:  5.38 mCi Tc-55m Choletec IV COMPARISON:  Abdominal Ultrasound 12/10/22 FINDINGS: Prompt uptake and biliary excretion of activity by the liver is seen. Gallbladder activity is visualized,  consistent with patency of cystic duct. Biliary activity passes into small bowel, consistent with patent common bile duct. Calculated gallbladder ejection fraction is 75%. (Normal gallbladder ejection fraction with half-and-half is greater than 33%.) IMPRESSION: No evidence of acute cholecystitis.  Normal ejection fraction. Electronically Signed   By: Lorenza Cambridge M.D.   On: 12/11/2022 13:20   US ABDOMEN LIMITED RUQ (LIVER/GB)  Result Date: 12/10/2022 CLINICAL DATA:  Upper abdominal pain. EXAM: ULTRASOUND ABDOMEN LIMITED RIGHT UPPER QUADRANT COMPARISON:  None Available. FINDINGS: Evaluation is limited due to body habitus and overlying bowel gas. Gallbladder: Multiple stones within the gallbladder. No gallbladder wall thickening or pericholecystic fluid. Positive sonographic Murphy's sign reported. Common bile duct: No biliary ductal dilatation. The common bile duct is not visualized. Liver: There is diffuse increased liver echogenicity most commonly seen in the setting of fatty infiltration. Superimposed inflammation  or fibrosis is not excluded. Clinical correlation is recommended. Portal vein is patent on color Doppler imaging with normal direction of blood flow towards the liver. Other: None. IMPRESSION: 1. Cholelithiasis without definite sonographic evidence of acute cholecystitis. A hepatobiliary scintigraphy may provide better evaluation of the gallbladder if there is a high clinical concern for acute cholecystitis . 2. Fatty liver. Electronically Signed   By: Elgie Collard M.D.   On: 12/10/2022 23:55    Assessment and Plan:   Elizabeth Spencer is a 37 y.o. y/o female has been referred for   1.  Cholelithiasis with cholecystitis without obstruction.  Refer to general surgeon to discuss cholecystectomy.  Low-fat diet  Follow up ***  Celso Amy, PA-C    BP check ***

## 2023-01-03 ENCOUNTER — Telehealth: Payer: Self-pay | Admitting: Medical

## 2023-01-03 NOTE — Telephone Encounter (Signed)
*  STAT* If patient is at the pharmacy, call can be transferred to refill team.   1. Which medications need to be refilled? (please list name of each medication and dose if known)  Refill all cardiac meds   2. Would you like to learn more about the convenience, safety, & potential cost savings by using the [facility]? no     3. Are you open to using the [facility] (Type [facility].  no  ).   4. Which pharmacy/location (including street and city if local pharmacy) is medication to be sent to? [address]    5. Do they need a 30 day or 90 day supply? 30 day supply

## 2023-01-03 NOTE — Telephone Encounter (Signed)
Left voicemail to reschedule appt on 10/3 due to provider being out of office.

## 2023-01-04 ENCOUNTER — Encounter: Payer: Self-pay | Admitting: Internal Medicine

## 2023-01-04 ENCOUNTER — Inpatient Hospital Stay: Payer: MEDICAID | Attending: Internal Medicine | Admitting: Internal Medicine

## 2023-01-04 ENCOUNTER — Other Ambulatory Visit: Payer: MEDICAID

## 2023-01-04 DIAGNOSIS — I2510 Atherosclerotic heart disease of native coronary artery without angina pectoris: Secondary | ICD-10-CM | POA: Insufficient documentation

## 2023-01-04 DIAGNOSIS — Z951 Presence of aortocoronary bypass graft: Secondary | ICD-10-CM | POA: Diagnosis not present

## 2023-01-04 DIAGNOSIS — Z7902 Long term (current) use of antithrombotics/antiplatelets: Secondary | ICD-10-CM | POA: Insufficient documentation

## 2023-01-04 DIAGNOSIS — Z7901 Long term (current) use of anticoagulants: Secondary | ICD-10-CM | POA: Diagnosis not present

## 2023-01-04 DIAGNOSIS — I82622 Acute embolism and thrombosis of deep veins of left upper extremity: Secondary | ICD-10-CM | POA: Insufficient documentation

## 2023-01-04 MED ORDER — METOPROLOL SUCCINATE ER 25 MG PO TB24: 25.0000 mg | ORAL_TABLET | Freq: Every day | ORAL | 0 refills | Status: DC

## 2023-01-04 MED ORDER — ELIQUIS 5 MG PO TABS: 5.0000 mg | ORAL_TABLET | Freq: Two times a day (BID) | ORAL | 0 refills | Status: DC

## 2023-01-04 MED ORDER — CLOPIDOGREL BISULFATE 75 MG PO TABS: 75.0000 mg | ORAL_TABLET | Freq: Every day | ORAL | 0 refills | Status: DC

## 2023-01-04 MED ORDER — ATORVASTATIN CALCIUM 40 MG PO TABS: 40.0000 mg | ORAL_TABLET | Freq: Every day | ORAL | 0 refills | Status: DC

## 2023-01-04 MED ORDER — SPIRONOLACTONE 25 MG PO TABS: 12.5000 mg | ORAL_TABLET | Freq: Every day | ORAL | 0 refills | Status: DC

## 2023-01-04 NOTE — Progress Notes (Signed)
Pt has been on plavix for heart bypass surgery done in June of this year. Pt had gallbladder surgery 2 weeks ago. States an IV needle caused her blood clot in right upper arm. Still tender, slight swelling. Still has dyspnea with exertion. Started on eliquis for DVT's. 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

## 2023-01-04 NOTE — Assessment & Plan Note (Addendum)
#   12/24/2022- Factor V leiden [Dx on Screening- April 2024] UE [place]- Acute occlusive thrombus in the cephalic vein extending from the antecubital region to the upper arm.  Currently on Eliquis [started 12/23/2012].  Provoked-see below.  Recommend total of anticoagulation for 2 months.  1 month more prescription given.  # Etiology of the upper extremity DVT-provoked likely from peripheral IV line [admitted for acute cholecystitis] in the context of heterozygous factor V Leiden.  I reviewed with the patient regarding the fact that factor V Leiden heterozygosity by itself is not an indication for anticoagulation.  # Easy bruising-likely multifactorial given antiplatelet therapy and also Eliquis.  Patient currently taking Eliquis 10 mg twice daily; however starting today recommend taking Eliquis 5 mg twice daily.   # ICMP- CAD s/p CABG- on asprin plus plavix [[facility]]  # dental extraction: Recommend dental procedure after patient is off Eliquis.  Patient should be able to fill his Eliquis by third week of November.  Patient can get her extractions done in [place] first week.  However she will need to follow-up with cardiology regarding antiplatelet therapy clearance.  # Discussed with the patient regarding the ill effects of smoking- including but not limited to cardiac lung and vascular diseases and malignancies. Counseled against smoking; patient said that she is cutting down her smoking.  Recommend talking to her PCP regarding smoking cessation.   # Also discussed the importance of factor V Leiden for the rest of the family.  Patient states that she has inform her 3 children; and other family members.   Thank you  for allowing me to participate in the care of your pleasant patient. Please do not hesitate to contact me with questions or concerns in the interim.   # DISPOSITION: # no labs-  # follow up as needed- Dr.B

## 2023-01-04 NOTE — Progress Notes (Signed)
[facility] CONSULT NOTE  Patient Care Team: Miki Kins, FNP as PCP - General (Family Medicine) Earna Coder, MD as Consulting Physician (Oncology)  CHIEF COMPLAINTS/PURPOSE OF CONSULTATION: DVT/PE  #  Oncology History   No history exists.   IMPRESSION: Acute occlusive thrombus in the cephalic vein extending from the antecubital region to the upper arm.     Electronically Signed   By: Alcide Clever M.D.   On: 12/24/2022 03:52  HISTORY OF PRESENTING ILLNESS: Patient ambulating-independently. Accompanied by family.   Elizabeth Spencer 37 y.o.  female with history  of bipolar affective disorder, chronic pain, hyperlipidemia, coronary artery disease CABG with single-vessel to the LAD, ischemic cardiomyopathy, systolic CHF EF 30 to 35%, and History of factor V leiden heterozygous [APRIl 2024-Dx on screening: index mother] but no prior history of thrombo-embolism has been referred to [place] regarding recent DVT of Left Upper extremity.  Of note patient was recently admitted to hospital for acute cholecystitis.  Patient underwent laparoscopic cholecystectomy.  Approximately 1-2 weeks later patient diagnosed with left upper extremity VTE.  Patient was started on Eliquis in the emergency room.   Review of Systems  Constitutional:  Negative for chills, diaphoresis, fever, malaise/fatigue and weight loss.  HENT:  Negative for nosebleeds and sore throat.   Eyes:  Negative for double vision.  Respiratory:  Positive for shortness of breath. Negative for cough, hemoptysis, sputum production and wheezing.   Cardiovascular:  Negative for chest pain, palpitations, orthopnea and leg swelling.  Gastrointestinal:  Negative for abdominal pain, blood in stool, constipation, diarrhea, heartburn, melena, nausea and vomiting.  Genitourinary:  Negative for dysuria, frequency and urgency.  Musculoskeletal:  Positive for back pain and joint pain.  Skin: Negative.  Negative for itching  and rash.  Neurological:  Negative for dizziness, tingling, focal weakness, weakness and headaches.  Endo/Heme/Allergies:  Does not bruise/bleed easily.  Psychiatric/Behavioral:  Negative for depression. The patient is nervous/anxious. The patient does not have insomnia.      MEDICAL HISTORY:  Past Medical History:  Diagnosis Date   Aneurysm (HCC) 01/04/2014   Arthritis    Asthma    WELL CONTROLLED   Brain aneurysm 2007   NEUROLOGY NOTE DOES NOT MENTION ANEURYSM BUT PT STATES SHE DID NOT HAVE TO HAVE SURGERY   Complication of anesthesia    FOR 1 C-SECTION PT WAS ITCHING AND VERY RED ON HER FACE   Cough, persistent 01/27/2016   Dermatitis due to sunburn 11/08/2013   Family history of adverse reaction to anesthesia    brother, neice and nephew got red in face with hives   Gallstones    GERD (gastroesophageal reflux disease)    Gonorrhea 06/20/2020   Headache    CHRONIC HEADACHES   History of chronic cough    DRY   Loss of memory 05/30/2017   Perforation of left tympanic membrane 05/17/2013   Pneumonia    PTSD (post-traumatic stress disorder)    PTSD (post-traumatic stress disorder)    Thyroid condition    PT WAS JUST TOLD ON 08-25-17 THAT SHE HAS A THYROID PROBLEM AND IS GOING TO F/U WITH ENDOCRINOLOGIST IN 2 WEEKS   Trichomonas vaginalis (TV) infection 06/20/2020    SURGICAL HISTORY: Past Surgical History:  Procedure Laterality Date   ABDOMINAL HYSTERECTOMY     CESAREAN SECTION     x3   CORONARY ARTERY BYPASS GRAFT N/A 09/02/2022   Procedure: CORONARY ARTERY BYPASS GRAFTING (CABG) X 1 USING LEFT INTERNAL MAMMARY ARTERY;  Surgeon: Alleen Borne, MD;  Location: [facility] OR;  Service: Open Heart Surgery;  Laterality: N/A;   CYSTOSCOPY N/A 09/29/2017   Procedure: CYSTOSCOPY;  Surgeon: Vena Austria, MD;  Location: [facility] ORS;  Service: Gynecology;  Laterality: N/A;   CYSTOSCOPY N/A 11/05/2017   Procedure: CYSTOSCOPY;  Surgeon: Vena Austria, MD;  Location: [facility] ORS;  Service:  Gynecology;  Laterality: N/A;   HYSTEROSCOPY WITH D & C N/A 09/01/2017   Procedure: DILATATION AND CURETTAGE /HYSTEROSCOPY;  Surgeon: Vena Austria, MD;  Location: [facility] ORS;  Service: Gynecology;  Laterality: N/A;   LAPAROSCOPY N/A 09/01/2017   Procedure: LAPAROSCOPY OPERATIVE with biopsy;  Surgeon: Vena Austria, MD;  Location: [facility] ORS;  Service: Gynecology;  Laterality: N/A;   LAPAROSCOPY N/A 11/05/2017   Procedure: LAPAROSCOPY DIAGNOSTIC;  Surgeon: Vena Austria, MD;  Location: [facility] ORS;  Service: Gynecology;  Laterality: N/A;   LEFT HEART CATH AND CORONARY ANGIOGRAPHY N/A 09/01/2022   Procedure: LEFT HEART CATH AND CORONARY ANGIOGRAPHY;  Surgeon: Yvonne Kendall, MD;  Location: [facility] INVASIVE CV LAB;  Service: Cardiovascular;  Laterality: N/A;   REPAIR VAGINAL CUFF N/A 11/05/2017   Procedure: REPAIR VAGINAL CUFF;  Surgeon: Vena Austria, MD;  Location: [facility] ORS;  Service: Gynecology;  Laterality: N/A;   TEE WITHOUT CARDIOVERSION N/A 09/02/2022   Procedure: TRANSESOPHAGEAL ECHOCARDIOGRAM;  Surgeon: Alleen Borne, MD;  Location[facility] OR;  Service: Open Heart Surgery;  Laterality: N/A;   TOTAL LAPAROSCOPIC HYSTERECTOMY WITH SALPINGECTOMY Bilateral 09/29/2017   Procedure: TOTAL LAPAROSCOPIC HYSTERECTOMY WITH SALPINGECTOMY;  Surgeon: Vena Austria, MD;  Location: [facility] ORS;  Service: Gynecology;  Laterality: Bilateral;    SOCIAL HISTORY: Social History   Socioeconomic History   Marital status: Single    Spouse name: Not on file   Number of children: Not on file   Years of education: Not on file   Highest education level: Not on file  Occupational History   Not on file  Tobacco Use   Smoking status: Every Day    Current packs/day: 0.50    Average packs/day: 0.5 packs/day for 18.0 years (9.0 ttl pk-yrs)    Types: Cigarettes    Passive exposure: Past   Smokeless tobacco: Never  Vaping Use   Vaping status: Never Used  Substance and Sexual Activity   Alcohol use: Yes     Alcohol/week: 0.0 standard drinks of alcohol    Comment: occasionally   Drug use: Not Currently    Types: Marijuana, Cocaine   Sexual activity: Yes    Partners: Male    Birth control/protection: Surgical    Comment: Hysterectomy  Other Topics Concern   Not on file  Social History Narrative   Not on file   Social Determinants of Health   Financial Resource Strain: Not on file  Food Insecurity: No Food Insecurity (12/11/2022)   Hunger Vital Sign    Worried About Running Out of Food in the Last Year: Never true    Ran Out of Food in the Last Year: Never true  Transportation Needs: No Transportation Needs (12/11/2022)   PRAPARE - Administrator, Civil Service (Medical): No    Lack of Transportation (Non-Medical): No  Physical Activity: Not on file  Stress: Not on file  Social Connections: Not on file  Intimate Partner Violence: Not At Risk (12/11/2022)   Humiliation, Afraid, Rape, and Kick questionnaire    Fear of Current or Ex-Partner: No    Emotionally Abused: No    Physically Abused: No    Sexually  Abused: No    FAMILY HISTORY: Family History  Problem Relation Age of Onset   Hypertension Mother    Alcohol abuse Mother    Deep vein thrombosis Mother    Alcohol abuse Father    COPD Father    Emphysema Father    Heart disease Father     ALLERGIES:  is allergic to azithromycin, clindamycin, codeine, meloxicam, neurontin [gabapentin], penicillins, clindamycin/lincomycin, other, diphenhydramine hcl, doxycycline, and egg-derived products.  MEDICATIONS:  Current Outpatient Medications  Medication Sig Dispense Refill   albuterol (VENTOLIN HFA) 108 (90 Base) MCG/ACT inhaler Inhale 2 puffs into the lungs every 6 (six) hours as needed for wheezing or shortness of breath. 1 each 5   ALPRAZolam (XANAX XR) 0.5 MG 24 hr tablet Take 1 tablet (0.5 mg total) by mouth 2 (two) times daily as needed for anxiety or sleep. 60 tablet 1   APIXABAN (ELIQUIS) VTE STARTER PACK (10MG  AND  5MG ) Take as directed on package: start with two-5mg  tablets twice daily for 7 days. On day 8, switch to one-5mg  tablet twice daily. 74 each 0   aspirin 81 MG chewable tablet Chew 1 tablet (81 mg total) by mouth daily. 90 tablet 3   atorvastatin (LIPITOR) 40 MG tablet Take 1 tablet (40 mg total) by mouth daily. 90 tablet 0   Biotin w/ Vitamins C & E (HAIR SKIN & NAILS GUMMIES PO) Take 1 Units by mouth daily.     cetirizine (ZYRTEC) 10 MG tablet Take 1 tablet (10 mg total) by mouth daily. 90 tablet 1   clopidogrel (PLAVIX) 75 MG tablet Take 1 tablet (75 mg total) by mouth daily. 90 tablet 0   dapagliflozin propanediol (FARXIGA) 10 MG TABS tablet Take 1 tablet (10 mg total) by mouth daily before breakfast. 90 tablet 3   fluticasone (FLONASE) 50 MCG/ACT nasal spray 1 spray by Each Nare route daily. 16 g 6   lidocaine (LIDODERM) 5 % Place 1 patch onto the skin every 12 (twelve) hours. Remove & Discard patch within 12 hours or as directed by MD 10 patch 0   methimazole (TAPAZOLE) 10 MG tablet Take 10 mg by mouth 3 (three) times daily.     metoprolol succinate (TOPROL-XL) 25 MG 24 hr tablet Take 1 tablet (25 mg total) by mouth daily. Take with or immediately following a meal. 90 tablet 0   montelukast (SINGULAIR) 10 MG tablet Take 1 tablet (10 mg total) by mouth daily. 90 tablet 1   nicotine (NICODERM CQ - DOSED IN MG/24 HOURS) 21 mg/24hr patch Place 1 patch (21 mg total) onto the skin daily. 28 patch 0   omeprazole (PRILOSEC) 20 MG capsule Take 20 mg by mouth 2 (two) times daily.     ondansetron (ZOFRAN) 4 MG tablet Take 1 tablet (4 mg total) by mouth every 8 (eight) hours as needed for nausea or vomiting. 20 tablet 0   spironolactone (ALDACTONE) 25 MG tablet Take 0.5 tablets (12.5 mg total) by mouth daily. 45 tablet 0   ELIQUIS 5 MG TABS tablet Take 1 tablet (5 mg total) by mouth 2 (two) times daily. 60 tablet 0   EPIPEN 2-PAK 0.3 MG/0.3ML SOAJ injection INJECT INTO THIGH MUSCLE THROUGH CLOTHES AS  NEEDED SEVERE ALLERGIC REACTION (Patient not taking: Reported on 01/04/2023) 1 each 1   No current facility-administered medications for this visit.       PHYSICAL EXAMINATION:  There were no vitals filed for this visit. There were no vitals filed for this visit.  Physical Exam Vitals and nursing note reviewed.  HENT:     Head: Normocephalic and atraumatic.     Mouth/Throat:     Pharynx: Oropharynx is clear.  Eyes:     Extraocular Movements: Extraocular movements intact.     Pupils: Pupils are equal, round, and reactive to light.  Cardiovascular:     Rate and Rhythm: Normal rate and regular rhythm.  Pulmonary:     Comments: Decreased breath sounds bilaterally.  Abdominal:     Palpations: Abdomen is soft.  Musculoskeletal:        General: Normal range of motion.     Cervical back: Normal range of motion.  Skin:    General: Skin is warm.  Neurological:     General: No focal deficit present.     Mental Status: She is alert and oriented to person, place, and time.  Psychiatric:        Behavior: Behavior normal.        Judgment: Judgment normal.      LABORATORY DATA:  I have reviewed the data as listed Lab Results  Component Value Date   WBC 15.5 (H) 12/13/2022   HGB 12.5 12/13/2022   HCT 37.8 12/13/2022   MCV 86.1 12/13/2022   PLT 253 12/13/2022   Recent Labs    08/31/22 1633 09/01/22 0202 12/11/22 0828 12/12/22 0416 12/13/22 0420  NA 139   < > 139 136 138  K 3.5   < > 3.3* 3.9 3.7  CL 108   < > 104 101 103  CO2 24   < > 24 28 28   GLUCOSE 109*   < > 99 105* 120*  BUN 7   < > 9 <5* <5*  CREATININE 0.62   < > 0.54 0.52 0.45  CALCIUM 8.9   < > 8.6* 8.5* 8.9  GFRNONAA >60   < > >60 >60 >60  PROT 6.5   < > 6.5 6.3* 6.6  ALBUMIN 3.5   < > 3.2* 3.1* 3.1*  AST 38   < > 14* 14* 39  ALT 36   < > 14 11 24   ALKPHOS 71   < > 95 81 80  BILITOT 0.4   < > 0.8 0.7 0.6  BILIDIR <0.1  --   --   --   --   IBILI NOT CALCULATED  --   --   --   --    < > = values in  this interval not displayed.    RADIOGRAPHIC STUDIES: I have personally reviewed the radiological images as listed and agreed with the findings in the report. US Venous Img Upper Uni Left  Result Date: 12/24/2022 CLINICAL DATA:  Swelling in the left arm EXAM: LEFT UPPER EXTREMITY VENOUS DOPPLER ULTRASOUND TECHNIQUE: Gray-scale sonography with graded compression, as well as color Doppler and duplex ultrasound were performed to evaluate the upper extremity deep venous system from the level of the subclavian vein and including the jugular, axillary, basilic, radial, ulnar and upper cephalic vein. Spectral Doppler was utilized to evaluate flow at rest and with distal augmentation maneuvers. COMPARISON:  None Available. FINDINGS: Contralateral Subclavian Vein: Respiratory phasicity is normal and symmetric with the symptomatic side. No evidence of thrombus. Normal compressibility. Internal Jugular Vein: No evidence of thrombus. Normal compressibility, respiratory phasicity and response to augmentation. Subclavian Vein: No evidence of thrombus. Normal compressibility, respiratory phasicity and response to augmentation. Axillary Vein: No evidence of thrombus. Normal compressibility, respiratory phasicity and response to augmentation. Cephalic  Vein: Thrombus is noted with decreased compressibility. This extends from the antecubital region in an area of previous IV site. Basilic Vein: No evidence of thrombus. Normal compressibility, respiratory phasicity and response to augmentation. Brachial Veins: No evidence of thrombus. Normal compressibility, respiratory phasicity and response to augmentation. Radial Veins: No evidence of thrombus. Normal compressibility, respiratory phasicity and response to augmentation. Ulnar Veins: No evidence of thrombus. Normal compressibility, respiratory phasicity and response to augmentation. Venous Reflux:  None visualized. Other Findings:  None visualized. IMPRESSION: Acute occlusive  thrombus in the cephalic vein extending from the antecubital region to the upper arm. Electronically Signed   By: Alcide Clever M.D.   On: 12/24/2022 03:52   CT ABDOMEN PELVIS W CONTRAST  Result Date: 12/12/2022 CLINICAL DATA:  Generalized abdominal pain. EXAM: CT ABDOMEN AND PELVIS WITH CONTRAST TECHNIQUE: Multidetector CT imaging of the abdomen and pelvis was performed using the standard protocol following bolus administration of intravenous contrast. RADIATION DOSE REDUCTION: This exam was performed according to the departmental dose-optimization program which includes automated exposure control, adjustment of the mA and/or kV according to patient size and/or use of iterative reconstruction technique. CONTRAST:  OMNIPAQUE IOHEXOL 300 MG/ML  SOLN COMPARISON:  CT 11/05/2017, right upper quadrant ultrasound 12/10/2022 and hepatobiliary scan 12/11/2022 FINDINGS: Lower chest: Sternotomy wires are present. Lung bases are otherwise clear. Hepatobiliary: Moderate cholelithiasis and sludge with findings suggesting gallbladder wall thickening. Liver and biliary tree are normal. Pancreas: Normal. Spleen: Normal. Adrenals/Urinary Tract: Adrenal glands are normal. Kidneys are normal size without hydronephrosis or nephrolithiasis. Stomach/Bowel: Stomach and small bowel are normal. Appendix is normal colon is unremarkable. Vascular/Lymphatic: Abdominal aorta is normal in caliber. No evidence of adenopathy. Reproductive: Previous hysterectomy. Right adnexa is unremarkable. Simple appearing 7.4 cm left ovarian cyst. Other: No significant free fluid. Musculoskeletal: No focal abnormality. IMPRESSION: 1. Moderate cholelithiasis and sludge with findings suggesting gallbladder wall thickening. Findings are concerning for acute cholecystitis, although recent right upper quadrant ultrasound and HIDA scan suggest no evidence of acute cholecystitis. Recommend clinical correlation as repeat right upper quadrant ultrasound may be  helpful if symptoms persist/worsen. 2. 7.4 cm simple appearing left ovarian cyst. Recommend follow-up pelvic US in 3-6 months. Electronically Signed   By: Elberta Fortis M.D.   On: 12/12/2022 12:28   NM Hepato W/EF  Result Date: 12/11/2022 CLINICAL DATA:  RUQ abdominal pain, biliary disease suspected, [place] nondiagnostic EXAM: NUCLEAR MEDICINE HEPATOBILIARY IMAGING WITH GALLBLADDER EF TECHNIQUE: Sequential images of the abdomen were obtained out to 60 minutes following intravenous administration of radiopharmaceutical. After oral ingestion of 8 ounces of ensure, gallbladder ejection fraction was determined. RADIOPHARMACEUTICALS:  5.38 mCi Tc-17m Choletec IV COMPARISON:  Abdominal Ultrasound 12/10/22 FINDINGS: Prompt uptake and biliary excretion of activity by the liver is seen. Gallbladder activity is visualized, consistent with patency of cystic duct. Biliary activity passes into small bowel, consistent with patent common bile duct. Calculated gallbladder ejection fraction is 75%. (Normal gallbladder ejection fraction with half-and-half is greater than 33%.) IMPRESSION: No evidence of acute cholecystitis.  Normal ejection fraction. Electronically Signed   By: Lorenza Cambridge M.D.   On: 12/11/2022 13:20   US ABDOMEN LIMITED RUQ (LIVER/GB)  Result Date: 12/10/2022 CLINICAL DATA:  Upper abdominal pain. EXAM: ULTRASOUND ABDOMEN LIMITED RIGHT UPPER QUADRANT COMPARISON:  None Available. FINDINGS: Evaluation is limited due to body habitus and overlying bowel gas. Gallbladder: Multiple stones within the gallbladder. No gallbladder wall thickening or pericholecystic fluid. Positive sonographic Murphy's sign reported. Common bile duct: No biliary ductal  dilatation. The common bile duct is not visualized. Liver: There is diffuse increased liver echogenicity most commonly seen in the setting of fatty infiltration. Superimposed inflammation or fibrosis is not excluded. Clinical correlation is recommended. Portal vein is patent on  color Doppler imaging with normal direction of blood flow towards the liver. Other: None. IMPRESSION: 1. Cholelithiasis without definite sonographic evidence of acute cholecystitis. A hepatobiliary scintigraphy may provide better evaluation of the gallbladder if there is a high clinical concern for acute cholecystitis . 2. Fatty liver. Electronically Signed   By: Elgie Collard M.D.   On: 12/10/2022 23:55    ASSESSMENT & PLAN:   Acute deep vein thrombosis (DVT) of brachial vein of left upper extremity (HCC) # 12/24/2022- Factor V leiden [Dx on Screening- April 2024] UE [place]- Acute occlusive thrombus in the cephalic vein extending from the antecubital region to the upper arm.  Currently on Eliquis [started 12/23/2012].  Provoked-see below.  Recommend total of anticoagulation for 2 months.  1 month more prescription given.  # Etiology of the upper extremity DVT-provoked likely from peripheral IV line [admitted for acute cholecystitis] in the context of heterozygous factor V Leiden.  I reviewed with the patient regarding the fact that factor V Leiden heterozygosity by itself is not an indication for anticoagulation.  # Easy bruising-likely multifactorial given antiplatelet therapy and also Eliquis.  Patient currently taking Eliquis 10 mg twice daily; however starting today recommend taking Eliquis 5 mg twice daily.   # ICMP- CAD s/p CABG- on asprin plus plavix [[facility]]  # dental extraction: Recommend dental procedure after patient is off Eliquis.  Patient should be able to fill his Eliquis by third week of November.  Patient can get her extractions done in [place] first week.  However she will need to follow-up with cardiology regarding antiplatelet therapy clearance.  # Discussed with the patient regarding the ill effects of smoking- including but not limited to cardiac lung and vascular diseases and malignancies. Counseled against smoking; patient said that she is cutting down her smoking.  Recommend  talking to her PCP regarding smoking cessation.   # Also discussed the importance of factor V Leiden for the rest of the family.  Patient states that she has inform her 3 children; and other family members.   Thank you  for allowing me to participate in the care of your pleasant patient. Please do not hesitate to contact me with questions or concerns in the interim.   # DISPOSITION: # no labs-  # follow up as needed- Dr.B     Earna Coder, MD 01/04/2023 3:38 PM

## 2023-01-05 ENCOUNTER — Encounter: Payer: MEDICAID | Admitting: Physician Assistant

## 2023-01-06 ENCOUNTER — Ambulatory Visit: Payer: MEDICAID | Admitting: Medical

## 2023-01-07 NOTE — Telephone Encounter (Signed)
Patient called the on call service, I asked her to go back to ED because she had additional clotting

## 2023-01-12 ENCOUNTER — Encounter: Payer: Self-pay | Admitting: Physician Assistant

## 2023-01-12 ENCOUNTER — Ambulatory Visit: Payer: MEDICAID | Admitting: Physician Assistant

## 2023-01-12 VITALS — BP 112/75 | HR 112 | Ht 67.0 in | Wt 220.0 lb

## 2023-01-12 DIAGNOSIS — K812 Acute cholecystitis with chronic cholecystitis: Secondary | ICD-10-CM

## 2023-01-12 DIAGNOSIS — K810 Acute cholecystitis: Secondary | ICD-10-CM

## 2023-01-12 DIAGNOSIS — Z09 Encounter for follow-up examination after completed treatment for conditions other than malignant neoplasm: Secondary | ICD-10-CM

## 2023-01-12 NOTE — Patient Instructions (Signed)
   Follow-up with our office as needed.  Please call and ask to speak with a nurse if you develop questions or concerns.  

## 2023-01-12 NOTE — Progress Notes (Signed)
[facility] POST-OP OFFICE VISIT  01/12/2023  HPI: Elizabeth Spencer is a 37 y.o. female ~5 weeks s/p robotic assisted laparoscopic cholecystectomy for acute on chronic cholecystitis with Dr Everlene Farrier.   She had been doing well at home She reports a few days prior feeling a "pop" sensation at her umbilicus There has been a small, superficial, opening to the superior portion of the wound No fever, chills, emesis She has GI upset particularly with noodles Other incisions healing well No other complaints   Vital signs: BP 112/75 (BP Location: Right Arm, Patient Position: Sitting, Cuff Size: Large)   Pulse (!) 112   Ht 5\' 7"  (1.702 m)   Wt 220 lb (99.8 kg)   LMP 08/22/2017 Comment: says she just spots.  SpO2 98%   BMI 34.46 kg/m    Physical Exam: Constitutional: Well appearing female, NAD Abdomen: Soft, non-tender, non-distended, no rebound/guarding Skin: To the superior 2-3 mm of the umbilical incision there has been very superficial dehiscence of the skin edges, no depth, no erythema, no drainage. All other laparoscopic incisions are healing well, no erythema or drainage   Assessment/Plan: This is a 37 y.o. female ~5 weeks s/p robotic assisted laparoscopic cholecystectomy for acute on chronic cholecystitis with Dr Everlene Farrier.    - Pain control prn  - Reviewed wound care recommendation; okay to utilize superficial dressings (ie: band-aids) for umbilical wound. Suspect this will close in 12 weeks without incident   - She has completed lifting restrictions  - She can follow up on as needed basis; She understands to call with questions/concerns  -- Lynden Oxford, PA-C [facility] 01/12/2023, 3:28 PM M-F: 7am - 4pm

## 2023-01-21 ENCOUNTER — Ambulatory Visit: Payer: MEDICAID | Admitting: Medical

## 2023-02-02 ENCOUNTER — Ambulatory Visit: Payer: MEDICAID | Attending: Medical | Admitting: Medical

## 2023-02-02 ENCOUNTER — Encounter: Payer: Self-pay | Admitting: Medical

## 2023-02-02 VITALS — BP 120/84 | HR 113 | Ht 67.0 in | Wt 227.6 lb

## 2023-02-02 DIAGNOSIS — I82622 Acute embolism and thrombosis of deep veins of left upper extremity: Secondary | ICD-10-CM | POA: Diagnosis not present

## 2023-02-02 DIAGNOSIS — D6851 Activated protein C resistance: Secondary | ICD-10-CM

## 2023-02-02 DIAGNOSIS — I429 Cardiomyopathy, unspecified: Secondary | ICD-10-CM | POA: Diagnosis not present

## 2023-02-02 DIAGNOSIS — R000 Tachycardia, unspecified: Secondary | ICD-10-CM

## 2023-02-02 DIAGNOSIS — I2510 Atherosclerotic heart disease of native coronary artery without angina pectoris: Secondary | ICD-10-CM

## 2023-02-02 DIAGNOSIS — I255 Ischemic cardiomyopathy: Secondary | ICD-10-CM

## 2023-02-02 MED ORDER — METOPROLOL SUCCINATE ER 50 MG PO TB24: 50.0000 mg | ORAL_TABLET | Freq: Every day | ORAL | 3 refills | Status: DC

## 2023-02-02 MED ORDER — SPIRONOLACTONE 25 MG PO TABS: 25.0000 mg | ORAL_TABLET | Freq: Every day | ORAL | 3 refills | Status: DC

## 2023-02-02 NOTE — Progress Notes (Unsigned)
Cardiology Office Note:    Date:  02/03/2023   ID:  Elizabeth Spencer, DOB 1986-01-21, MRN [id number]  PCP:  Miki Kins, FNP  [facility] Cardiologist:  None  [facility] Electrophysiologist:  None   Referring MD: Miki Kins, FNP   Chief Complaint: Hospital follow-up  History of Present Illness:    Elizabeth Spencer is a 37 y.o. 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   Elizabeth Spencer was seen in May 2024 for chest pain during an admission.  She had recently been diagnosed with factor V Leiden and was going to be started on a blood thinner.  High-sensitivity troponin went up to 1163.  UDS was positive for cocaine.  She was started on IV heparin and admitted.  Patient underwent heart cath that showed severe single-vessel CAD with hazy eccentric 90% ostial LAD stenosis concerning for acute plaque rupture, tubular 40% proximal RCA stenosis large circumflex without significant disease.  EF noted to be 30 to 35%.  Follow-up echo confirmed LVEF of 30 to 35%, mild LVH, grade 1 diastolic dysfunction, mild MR.  Patient was transferred to [facility] for CABG consideration.  She underwent CABG x 1 utilizing LIMA to LAD.  Postop period was uncomplicated.  Patient was discharged on 09/03/2022.   Patient was seen in the office November 29, 2022 for ER follow-up for left arm pain and musculoskeletal pain in her chest and shoulder.  It was recommended patient restrain from using her arms or lifting heavy weights.  Patient was started on spironolactone for HFrEF.  Heart rate was high and Toprol was refilled.  It was recommended she see endocrinology for hyperthyroidism.   She was seen in the hospital by cardiology on 12/11/22 for pre-op  evaluation prior to gallbladder surgery. She was stable from a cardiac perspective and no further cardiac work-up was pursued.   The patient went to the ER 12/24/22 with arm pain diagnosed with blood clot in the left arm and started on Eliquis. She is following with heme/onc.  Today, the patient reports she got her gallbladder out and is doing well. She is adjusting her diet daily, and does not tolerate many foods. She denies chest pain. She reports unchanged SOB. HR is still elevated, which is chronic. Long discussion about diet. She is still smoking. Patient denies recent cocaine use. She reports compliance with Eliquis, has noticed easy bruising.   Past Medical History:  Diagnosis Date   Aneurysm (HCC) 01/04/2014   Arthritis    Asthma    WELL CONTROLLED   Brain aneurysm 2007   NEUROLOGY NOTE DOES NOT MENTION ANEURYSM BUT PT STATES SHE DID NOT HAVE TO HAVE SURGERY   Complication of anesthesia    FOR 1 C-SECTION PT WAS ITCHING AND VERY RED ON HER FACE   Cough, persistent 01/27/2016   Dermatitis due to sunburn 11/08/2013   Family history of adverse reaction to anesthesia    brother, neice and nephew got red in face with hives   Gallstones    GERD (gastroesophageal reflux disease)    Gonorrhea 06/20/2020   Headache    CHRONIC HEADACHES   History of chronic cough    DRY   Loss of memory 05/30/2017   Perforation of left tympanic membrane 05/17/2013  Pneumonia    PTSD (post-traumatic stress disorder)    PTSD (post-traumatic stress disorder)    Thyroid condition    PT WAS JUST TOLD ON 08-25-17 THAT SHE HAS A THYROID PROBLEM AND IS GOING TO F/U WITH ENDOCRINOLOGIST IN 2 WEEKS   Trichomonas vaginalis (TV) infection 06/20/2020    Past Surgical History:  Procedure Laterality Date   ABDOMINAL HYSTERECTOMY     CESAREAN SECTION     x3   CORONARY ARTERY BYPASS GRAFT N/A 09/02/2022   Procedure: CORONARY ARTERY BYPASS GRAFTING (CABG) X 1 USING LEFT INTERNAL MAMMARY ARTERY;  Surgeon: Alleen Borne, MD;  Location: [facility] OR;  Service: Open Heart Surgery;  Laterality: N/A;   CYSTOSCOPY N/A 09/29/2017   Procedure: CYSTOSCOPY;  Surgeon: Vena Austria, MD;  Location: [facility] ORS;  Service: Gynecology;  Laterality: N/A;   CYSTOSCOPY N/A 11/05/2017   Procedure: CYSTOSCOPY;  Surgeon: Vena Austria, MD;  Location: [facility] ORS;  Service: Gynecology;  Laterality: N/A;   HYSTEROSCOPY WITH D & C N/A 09/01/2017   Procedure: DILATATION AND CURETTAGE /HYSTEROSCOPY;  Surgeon: Vena Austria, MD;  Location: [facility] ORS;  Service: Gynecology;  Laterality: N/A;   LAPAROSCOPY N/A 09/01/2017   Procedure: LAPAROSCOPY OPERATIVE with biopsy;  Surgeon: Vena Austria, MD;  Location: [facility] ORS;  Service: Gynecology;  Laterality: N/A;   LAPAROSCOPY N/A 11/05/2017   Procedure: LAPAROSCOPY DIAGNOSTIC;  Surgeon: Vena Austria, MD;  Location: [facility] ORS;  Service: Gynecology;  Laterality: N/A;   LEFT HEART CATH AND CORONARY ANGIOGRAPHY N/A 09/01/2022   Procedure: LEFT HEART CATH AND CORONARY ANGIOGRAPHY;  Surgeon: Yvonne Kendall, MD;  Location: [facility] INVASIVE CV LAB;  Service: Cardiovascular;  Laterality: N/A;   REPAIR VAGINAL CUFF N/A 11/05/2017   Procedure: REPAIR VAGINAL CUFF;  Surgeon: Vena Austria, MD;  Location: [facility] ORS;  Service: Gynecology;  Laterality: N/A;   TEE WITHOUT CARDIOVERSION N/A 09/02/2022   Procedure: TRANSESOPHAGEAL ECHOCARDIOGRAM;  Surgeon: Alleen Borne, MD;  Location: [facility] OR;  Service: Open Heart Surgery;  Laterality: N/A;   TOTAL LAPAROSCOPIC HYSTERECTOMY WITH SALPINGECTOMY Bilateral 09/29/2017   Procedure: TOTAL LAPAROSCOPIC HYSTERECTOMY WITH SALPINGECTOMY;  Surgeon: Vena Austria, MD;  Location: [facility] ORS;  Service: Gynecology;  Laterality: Bilateral;    Current Medications: Current Meds  Medication Sig   albuterol (VENTOLIN HFA) 108 (90 Base) MCG/ACT inhaler Inhale 2 puffs into the lungs every 6 (six) hours as needed for wheezing or shortness of breath.   ALPRAZolam (XANAX XR)  0.5 MG 24 hr tablet Take 1 tablet (0.5 mg total) by mouth 2 (two) times daily as needed for anxiety or sleep.   aspirin 81 MG chewable tablet Chew 1 tablet (81 mg total) by mouth daily.   atorvastatin (LIPITOR) 40 MG tablet Take 1 tablet (40 mg total) by mouth daily.   Biotin w/ Vitamins C & E (HAIR SKIN & NAILS GUMMIES PO) Take 1 Units by mouth daily.   cetirizine (ZYRTEC) 10 MG tablet Take 1 tablet (10 mg total) by mouth daily.   clopidogrel (PLAVIX) 75 MG tablet Take 1 tablet (75 mg total) by mouth daily.   dapagliflozin propanediol (FARXIGA) 10 MG TABS tablet Take 1 tablet (10 mg total) by mouth daily before breakfast.   ELIQUIS 5 MG TABS tablet Take 1 tablet (5 mg total) by mouth 2 (two) times daily.   EPIPEN 2-PAK 0.3 MG/0.3ML SOAJ injection INJECT INTO THIGH MUSCLE THROUGH CLOTHES AS NEEDED SEVERE ALLERGIC REACTION   fluticasone (FLONASE) 50 MCG/ACT nasal spray 1 spray by Each Nare route daily.  methimazole (TAPAZOLE) 10 MG tablet Take 10 mg by mouth 3 (three) times daily.   montelukast (SINGULAIR) 10 MG tablet Take 1 tablet (10 mg total) by mouth daily.   omeprazole (PRILOSEC) 20 MG capsule Take 20 mg by mouth 2 (two) times daily.   ondansetron (ZOFRAN) 4 MG tablet Take 1 tablet (4 mg total) by mouth every 8 (eight) hours as needed for nausea or vomiting.   [DISCONTINUED] apixaban (ELIQUIS) 5 MG TABS tablet Take 5 mg by mouth 2 (two) times daily.   [DISCONTINUED] metoprolol succinate (TOPROL-XL) 25 MG 24 hr tablet Take 1 tablet (25 mg total) by mouth daily. Take with or immediately following a meal.   [DISCONTINUED] spironolactone (ALDACTONE) 25 MG tablet Take 0.5 tablets (12.5 mg total) by mouth daily.     Allergies:   Azithromycin, Clindamycin, Codeine, Meloxicam, Neurontin [gabapentin], Penicillins, Clindamycin/lincomycin, Other, Diphenhydramine hcl, Doxycycline, and Egg-derived products   Social History   Socioeconomic History   Marital status: Single    Spouse name: Not on file    Number of children: Not on file   Years of education: Not on file   Highest education level: Not on file  Occupational History   Not on file  Tobacco Use   Smoking status: Every Day    Current packs/day: 0.50    Average packs/day: 0.5 packs/day for 18.0 years (9.0 ttl pk-yrs)    Types: Cigarettes    Passive exposure: Past   Smokeless tobacco: Never  Vaping Use   Vaping status: Never Used  Substance and Sexual Activity   Alcohol use: Yes    Alcohol/week: 0.0 standard drinks of alcohol    Comment: occasionally   Drug use: Not Currently    Types: Marijuana, Cocaine   Sexual activity: Yes    Partners: Male    Birth control/protection: Surgical    Comment: Hysterectomy  Other Topics Concern   Not on file  Social History Narrative   Not on file   Social Determinants of Health   Financial Resource Strain: Not on file  Food Insecurity: No Food Insecurity (12/11/2022)   Hunger Vital Sign    Worried About Running Out of Food in the Last Year: Never true    Ran Out of Food in the Last Year: Never true  Transportation Needs: No Transportation Needs (12/11/2022)   PRAPARE - Administrator, Civil Service (Medical): No    Lack of Transportation (Non-Medical): No  Physical Activity: Not on file  Stress: Not on file  Social Connections: Not on file     Family History: The patient's family history includes Alcohol abuse in her father and mother; COPD in her father; Deep vein thrombosis in her mother; Emphysema in her father; Heart disease in her father; Hypertension in her mother.  ROS:   Please see the history of present illness.     All other systems reviewed and are negative.  EKGs/Labs/Other Studies Reviewed:    The following studies were reviewed today:    Intraoperative TEE 08/2022 Complications: No known complications during this procedure.  POST-OP IMPRESSIONS  _ Left Ventricle: The left ventricle is unchanged from pre-bypass. EF  25-30%  _ Right  Ventricle: The right ventricle appears unchanged from pre-bypass.  _ Aorta: The aorta appears unchanged from pre-bypass.  _ Left Atrium: The left atrium appears unchanged from pre-bypass.  _ Left Atrial Appendage: The left atrial appendage appears unchanged from  pre-bypass.  _ Aortic Valve: The aortic valve appears unchanged from pre-bypass.  _ Mitral Valve: The mitral valve appears unchanged from pre-bypass.  _ Tricuspid Valve: The tricuspid valve appears unchanged from pre-bypass.  _ Pulmonic Valve: The pulmonic valve appears unchanged from pre-bypass.  _ Interatrial Septum: The interatrial septum appears unchanged from  pre-bypass.  _ Interventricular Septum: The interventricular septum appears unchanged  from  pre-bypass.  _ Pericardium: The pericardium appears unchanged from pre-bypass.    Echo 08/2022  1. Left ventricular ejection fraction, by estimation, is 30 to 35%. The  left ventricle has moderate to severely decreased function. The left  ventricle demonstrates global hypokinesis. There is mild left ventricular  hypertrophy. Left ventricular  diastolic parameters are consistent with Grade I diastolic dysfunction  (impaired relaxation).   2. Right ventricular systolic function is normal. The right ventricular  size is not well visualized.   3. The mitral valve is normal in structure. Mild mitral valve  regurgitation.   4. The aortic valve was not well visualized. Aortic valve regurgitation  is not visualized.    LHC 08/2022 Conclusions: Severe single-vessel coronary artery disease, with hazy and eccentric 90% ostial LAD stenosis concerning for acute plaque rupture.  Tubular 40% proximal RCA stenosis is also present.  Large LCx without significant disease. Moderately to severely reduced left ventricular systolic function (LVEF 30-35%) with anterior wall hypokinesis/akinesis.  LVEF 30-35%. Moderately elevated left ventricular filling pressure (LVEDP 25 mmHg).    Recommendations: Critical ostial LAD disease not well-suited for PCI given risk for compromise of LCx and D1.  Images reviewed with interventional cardiology and cardiac surgery teams at [facility]; we will arrange for transfer to [facility] with plans for CABG tomorrow. Restart heparin infusion 2 hours after TR band removal. Titrate NTG infusion for relief of chest pain.  IABP deferred due to patient's inability to lie flat and remain still. Aggressive secondary prevention of coronary artery disease. Obtain echocardiogram.   Yvonne Kendall, MD [facility]    EKG:  EKG is ordered today.  The ekg ordered today demonstrates ST 107bpm, no St/T wave changes  Recent Labs: 09/01/2022: TSH 0.018 09/03/2022: Magnesium 1.9 12/13/2022: ALT 24; BUN <5; Creatinine, Ser 0.45; Hemoglobin 12.5; Platelets 253; Potassium 3.7; Sodium 138  Recent Lipid Panel    Component Value Date/Time   CHOL 135 08/31/2022 2200   CHOL 191 06/17/2022 1533   TRIG 110 08/31/2022 2200   HDL 46 08/31/2022 2200   HDL 45 06/17/2022 1533   CHOLHDL 2.9 08/31/2022 2200   VLDL 22 08/31/2022 2200   LDLCALC 67 08/31/2022 2200   LDLCALC 108 (H) 06/17/2022 1533   Physical Exam:    VS:  BP 120/84 (BP Location: Left Arm, Patient Position: Sitting, Cuff Size: Large)   Pulse (!) 113   Ht 5\' 7"  (1.702 m)   Wt 227 lb 9.6 oz (103.2 kg)   LMP 08/22/2017 Comment: says she just spots.  SpO2 98%   BMI 35.65 kg/m     Wt Readings from Last 3 Encounters:  02/02/23 227 lb 9.6 oz (103.2 kg)  01/12/23 220 lb (99.8 kg)  12/24/22 220 lb (99.8 kg)     GEN:  Well nourished, well developed in no acute distress HEENT: Normal NECK: No JVD; No carotid bruits LYMPHATICS: No lymphadenopathy CARDIAC: RRR, no murmurs, rubs, gallops RESPIRATORY:  Clear to auscultation without rales, wheezing or rhonchi  ABDOMEN: Soft, non-tender, non-distended MUSCULOSKELETAL:  No edema; No deformity  SKIN: Warm and dry NEUROLOGIC:  Alert and oriented x  3 PSYCHIATRIC:  Normal affect  ASSESSMENT:    1. Coronary artery disease involving native coronary artery of native heart without angina pectoris   2. Acute deep vein thrombosis (DVT) of brachial vein of left upper extremity (HCC)   3. Factor 5 Leiden mutation, heterozygous (HCC)   4. Cardiomyopathy, unspecified type (HCC)   5. Ischemic cardiomyopathy   6. Sinus tachycardia    PLAN:    In order of problems listed above:  CAD s/p CABG x 1 NSTEMI The patient denies chest pain. Continue ASA, Plavix, Lipitor and toprol. No further ischemic work-up at this time.   Acute left upper extremity DVT Factor V Leiden She had provoked upper arm DVT diagnosed in the ER 12/24/22 (2 weeks after discharge) and was started on Eliquis. She is followed by heme/onc for factor V leiden heterozygous, recommended anticoagulation with Eliquis for 2 months.  She is also on ASA and plavix.   HFrEF ICM Echo 08/2027 showed LVEF 30-35%. The patient reports increased volume. She is off lasix, which she thinks was stopped in the hospital. On exam she does not appear significantly volume up. I will increase spironolactone to 25mg  daily. Continue toprol and [place]. I will order a repeat echo to evaluate the pump function.   Sinus tachycardia HR chronically elevated. I will increase toprol to 50mg  daily.   Disposition: Follow up in 3 month(s) with MD/APP    Signed, Clemente Dewey David Stall, PA-C  02/03/2023 11:42 AM    [facility]

## 2023-02-02 NOTE — Patient Instructions (Signed)
Medication Instructions:  Your physician has recommended you make the following change in your medication:   INCREASE: metoprolol succinate (TOPROL-XL) 50 MG 24 hr tablet - Take 1 tablet (50 mg total) by mouth daily.   spironolactone (ALDACTONE) 25 MG tablet - Take 1 tablet (25 mg total) by mouth daily  *If you need a refill on your cardiac medications before your next appointment, please call your pharmacy*  Lab Work: - None ordered  Testing/Procedures: Your physician has requested that you have an echocardiogram. Echocardiography is a painless test that uses sound waves to create images of your heart. It provides your doctor with information about the size and shape of your heart and how well your heart's chambers and valves are working. This procedure takes approximately one hour. There are no restrictions for this procedure. Please do NOT wear cologne, perfume, aftershave, or lotions (deodorant is allowed). Please arrive 15 minutes prior to your appointment time.   Follow-Up: At [facility], you and your health needs are our priority.  As part of our continuing mission to provide you with exceptional heart care, we have created designated Provider Care Teams.  These Care Teams include your primary Cardiologist (physician) and Advanced Practice Providers (APPs -  Physician Assistants and Nurse Practitioners) who all work together to provide you with the care you need, when you need it.  Your next appointment:   3 month(s)  Provider:   Terrilee Croak, PA-C    Other Instructions - None

## 2023-02-07 ENCOUNTER — Telehealth: Payer: Self-pay | Admitting: Internal Medicine

## 2023-02-07 MED ORDER — SPIRONOLACTONE 25 MG PO TABS: 12.5000 mg | ORAL_TABLET | Freq: Every day | ORAL | 3 refills | Status: DC

## 2023-02-07 MED ORDER — METOPROLOL SUCCINATE ER 25 MG PO TB24: 25.0000 mg | ORAL_TABLET | Freq: Every day | ORAL | 3 refills | Status: DC

## 2023-02-07 NOTE — Telephone Encounter (Signed)
Spoke with patient and she was very upset and demanding. She wanted something for her sleep stating that she took 4 xanax yesterday and today which did not help. Reviewed that we do not prescribe sleep medications but that we can address the metoprolol. She wants something done stating that we messed things up and she wants [place] to fix it. Advised that I would have provider give her call to discuss this information. She states that she wants [place] to take care of this before 2 pm so her pharmacy can deliver her medication. Advised I would make provider aware.

## 2023-02-07 NOTE — Telephone Encounter (Signed)
Pt c/o medication issue:  1. Name of Medication:   metoprolol succinate (TOPROL-XL) 50 MG 24 hr tablet    2. How are you currently taking this medication (dosage and times per day)? As prescribed   3. Are you having a reaction (difficulty breathing--STAT)? Yes  4. What is your medication issue? States she is not able to sleep and has a headache since increase. She reports trying to take it in the morning instead of night and is still unable to sleep and feels she is not able to continue like this. Patient is requesting a callback today to be taken off of this medication. Please advise.

## 2023-02-07 NOTE — Telephone Encounter (Signed)
    Fransico Michael, Cadence H, PA-C  Bryna Colander, RN Caller: Unspecified (Today, 11:09 AM) Spoke to the patient on the phone. She said she doubled her Toprol and spiro on Friday and was unable to sleep all weekend. She took Xanax x4 and still unable to sleep. I let her known those are not normal side effects of the medication. We can go back down to the lower doses. I recommended she see PCP.

## 2023-02-07 NOTE — Telephone Encounter (Signed)
Medications and doses reviewed with provider. Changed back to Toprol XL 25 mg once daily and Spironolactone 12.5 mg once daily. Medication list updated and provider reviewed with patient.

## 2023-02-18 NOTE — Progress Notes (Unsigned)
Sleep Medicine   Office Visit  Patient Name: Elizabeth Spencer DOB: 07/06/1985 MRN [id number]    Chief Complaint: sleep evaluation  Brief History:  [name] presents for an initial consult for sleep evaluation and to establish care. Patient has at least a 5 year  history of witnessed apnea and EDS. She reports her sleep quality is poor. This is noted most nights. The patient's bed partner reports  some snoring and witnessed apnea at night. The patient relates the following symptoms: waking tired, gasping and choking, EDS, morning headaches, brain fog and memory issues are also present. The patient goes to sleep at 2am and wakes up at 8am-9am.  Patient wakes multiple times at night and reports choking in her sleep. Sleep quality is worse when outside home environment.  Patient has noted restlessness of her legs at night that would disrupt her sleep.  The patient  relates dream  during the night.  The patient relates  a history of psychiatric problems (depression, anxiety, ptsd) . The Epworth Sleepiness Score is 8 out of 24 .  The patient relates  Cardiovascular risk factors include: coronary artery disease, cardiomyopathy.     ROS  General: (-) fever, (-) chills, (-) night sweat Nose and Sinuses: (-) nasal stuffiness or itchiness, (-) postnasal drip, (-) nosebleeds, (-) sinus trouble. Mouth and Throat: (-) sore throat, (-) hoarseness. Neck: (-) swollen glands, (-) enlarged thyroid, (-) neck pain. Respiratory: + cough, + shortness of breath, - wheezing. Neurologic: - numbness, - tingling. Psychiatric: + anxiety, + depression Sleep behavior: -sleep paralysis -hypnogogic hallucinations -dream enactment      -vivid dreams -cataplexy -night terrors -sleep walking   Current Medication: Outpatient Encounter Medications as of 02/21/2023  Medication Sig Note   albuterol (VENTOLIN HFA) 108 (90 Base) MCG/ACT inhaler Inhale 2 puffs into the lungs every 6 (six) hours as needed for wheezing or shortness of  breath.    ALPRAZolam (XANAX XR) 0.5 MG 24 hr tablet Take 1 tablet (0.5 mg total) by mouth 2 (two) times daily as needed for anxiety or sleep.    aspirin 81 MG chewable tablet Chew 1 tablet (81 mg total) by mouth daily.    atorvastatin (LIPITOR) 40 MG tablet Take 1 tablet (40 mg total) by mouth daily.    Biotin w/ Vitamins C & E (HAIR SKIN & NAILS GUMMIES PO) Take 1 Units by mouth daily.    cetirizine (ZYRTEC) 10 MG tablet Take 1 tablet (10 mg total) by mouth daily. 12/12/2022: Takes at bedtime   clopidogrel (PLAVIX) 75 MG tablet Take 1 tablet (75 mg total) by mouth daily.    dapagliflozin propanediol (FARXIGA) 10 MG TABS tablet Take 1 tablet (10 mg total) by mouth daily before breakfast.    ELIQUIS 5 MG TABS tablet Take 1 tablet (5 mg total) by mouth 2 (two) times daily.    EPIPEN 2-PAK 0.3 MG/0.3ML SOAJ injection INJECT INTO THIGH MUSCLE THROUGH CLOTHES AS NEEDED SEVERE ALLERGIC REACTION 10/29/2022: As needed   fluticasone (FLONASE) 50 MCG/ACT nasal spray 1 spray by Each Nare route daily.    methimazole (TAPAZOLE) 10 MG tablet Take 10 mg by mouth 3 (three) times daily.    metoprolol succinate (TOPROL XL) 25 MG 24 hr tablet Take 1 tablet (25 mg total) by mouth daily.    montelukast (SINGULAIR) 10 MG tablet Take 1 tablet (10 mg total) by mouth daily.    nicotine (NICODERM CQ - DOSED IN MG/24 HOURS) 21 mg/24hr patch Place 1 patch (21 mg total)  onto the skin daily. (Patient not taking: Reported on 02/02/2023)    omeprazole (PRILOSEC) 20 MG capsule Take 20 mg by mouth 2 (two) times daily.    ondansetron (ZOFRAN) 4 MG tablet Take 1 tablet (4 mg total) by mouth every 8 (eight) hours as needed for nausea or vomiting.    spironolactone (ALDACTONE) 25 MG tablet Take 0.5 tablets (12.5 mg total) by mouth daily.    No facility-administered encounter medications on file as of 02/21/2023.    Surgical History: Past Surgical History:  Procedure Laterality Date   ABDOMINAL HYSTERECTOMY     CESAREAN SECTION      x3   CORONARY ARTERY BYPASS GRAFT N/A 09/02/2022   Procedure: CORONARY ARTERY BYPASS GRAFTING (CABG) X 1 USING LEFT INTERNAL MAMMARY ARTERY;  Surgeon: Alleen Borne, MD;  Location: [facility];  Service: Open Heart Surgery;  Laterality: N/A;   CYSTOSCOPY N/A 09/29/2017   Procedure: CYSTOSCOPY;  Surgeon: Vena Austria, MD;  Location: [facility] ORS;  Service: Gynecology;  Laterality: N/A;   CYSTOSCOPY N/A 11/05/2017   Procedure: CYSTOSCOPY;  Surgeon: Vena Austria, MD;  Location: [facility] ORS;  Service: Gynecology;  Laterality: N/A;   HYSTEROSCOPY WITH D & C N/A 09/01/2017   Procedure: DILATATION AND CURETTAGE /HYSTEROSCOPY;  Surgeon: Vena Austria, MD;  Location: [facility] ORS;  Service: Gynecology;  Laterality: N/A;   LAPAROSCOPY N/A 09/01/2017   Procedure: LAPAROSCOPY OPERATIVE with biopsy;  Surgeon: Vena Austria, MD;  Location: [facility] ORS;  Service: Gynecology;  Laterality: N/A;   LAPAROSCOPY N/A 11/05/2017   Procedure: LAPAROSCOPY DIAGNOSTIC;  Surgeon: Vena Austria, MD;  Location: [facility] ORS;  Service: Gynecology;  Laterality: N/A;   LEFT HEART CATH AND CORONARY ANGIOGRAPHY N/A 09/01/2022   Procedure: LEFT HEART CATH AND CORONARY ANGIOGRAPHY;  Surgeon: Yvonne Kendall, MD;  Location: [facility] INVASIVE CV LAB;  Service: Cardiovascular;  Laterality: N/A;   REPAIR VAGINAL CUFF N/A 11/05/2017   Procedure: REPAIR VAGINAL CUFF;  Surgeon: Vena Austria, MD;  Location: [facility] ORS;  Service: Gynecology;  Laterality: N/A;   TEE WITHOUT CARDIOVERSION N/A 09/02/2022   Procedure: TRANSESOPHAGEAL ECHOCARDIOGRAM;  Surgeon: Alleen Borne, MD;  Location: [facility] OR;  Service: Open Heart Surgery;  Laterality: N/A;   TOTAL LAPAROSCOPIC HYSTERECTOMY WITH SALPINGECTOMY Bilateral 09/29/2017   Procedure: TOTAL LAPAROSCOPIC HYSTERECTOMY WITH SALPINGECTOMY;  Surgeon: Vena Austria, MD;  Location: [facility] ORS;  Service: Gynecology;  Laterality: Bilateral;    Medical History: Past Medical History:  Diagnosis Date   Aneurysm  (HCC) 01/04/2014   Arthritis    Asthma    WELL CONTROLLED   Brain aneurysm 2007   NEUROLOGY NOTE DOES NOT MENTION ANEURYSM BUT PT STATES SHE DID NOT HAVE TO HAVE SURGERY   Complication of anesthesia    FOR 1 C-SECTION PT WAS ITCHING AND VERY RED ON HER FACE   Cough, persistent 01/27/2016   Dermatitis due to sunburn 11/08/2013   Family history of adverse reaction to anesthesia    brother, neice and nephew got red in face with hives   Gallstones    GERD (gastroesophageal reflux disease)    Gonorrhea 06/20/2020   Headache    CHRONIC HEADACHES   History of chronic cough    DRY   Loss of memory 05/30/2017   Perforation of left tympanic membrane 05/17/2013   Pneumonia    PTSD (post-traumatic stress disorder)    PTSD (post-traumatic stress disorder)    Thyroid condition    PT WAS JUST TOLD ON 08-25-17 THAT SHE HAS A THYROID PROBLEM AND IS  GOING TO F/U WITH ENDOCRINOLOGIST IN 2 WEEKS   Trichomonas vaginalis (TV) infection 06/20/2020    Family History: Non contributory to the present illness  Social History: Social History   Socioeconomic History   Marital status: Single    Spouse name: Not on file   Number of children: Not on file   Years of education: Not on file   Highest education level: Not on file  Occupational History   Not on file  Tobacco Use   Smoking status: Every Day    Current packs/day: 0.50    Average packs/day: 0.5 packs/day for 18.0 years (9.0 ttl pk-yrs)    Types: Cigarettes    Passive exposure: Past   Smokeless tobacco: Never  Vaping Use   Vaping status: Never Used  Substance and Sexual Activity   Alcohol use: Yes    Alcohol/week: 0.0 standard drinks of alcohol    Comment: occasionally   Drug use: Not Currently    Types: Marijuana, Cocaine   Sexual activity: Yes    Partners: Male    Birth control/protection: Surgical    Comment: Hysterectomy  Other Topics Concern   Not on file  Social History Narrative   Not on file   Social Determinants of  Health   Financial Resource Strain: Not on file  Food Insecurity: No Food Insecurity (12/11/2022)   Hunger Vital Sign    Worried About Running Out of Food in the Last Year: Never true    Ran Out of Food in the Last Year: Never true  Transportation Needs: No Transportation Needs (12/11/2022)   PRAPARE - Administrator, Civil Service (Medical): No    Lack of Transportation (Non-Medical): No  Physical Activity: Not on file  Stress: Not on file  Social Connections: Not on file  Intimate Partner Violence: Not At Risk (12/11/2022)   Humiliation, Afraid, Rape, and Kick questionnaire    Fear of Current or Ex-Partner: No    Emotionally Abused: No    Physically Abused: No    Sexually Abused: No    Vital Signs: Blood pressure 107/83, pulse 100, resp. rate 16, height 5\' 7"  (1.702 m), weight 233 lb 12.8 oz (106.1 kg), last menstrual period 08/22/2017, SpO2 98%. Body mass index is 36.62 kg/m.   Examination: General Appearance: The patient is well-developed, well-nourished, and in no distress. Neck Circumference: 39.5 cm Skin: Gross inspection of skin unremarkable. Head: normocephalic, no gross deformities. Eyes: no gross deformities noted. ENT: ears appear grossly normal Neurologic: Alert and oriented. No involuntary movements.    STOP BANG RISK ASSESSMENT S (snore) Have you been told that you snore?     YES   T (tired) Are you often tired, fatigued, or sleepy during the day?   YES  O (obstruction) Do you stop breathing, choke, or gasp during sleep? YES   P (pressure) Do you have or are you being treated for high blood pressure? YES   B (BMI) Is your body index greater than 35 kg/m? YES   A (age) Are you 6 years old or older? NO   N (neck) Do you have a neck circumference greater than 16 inches?   NO   G (gender) Are you a female? NO   TOTAL STOP/BANG "YES" ANSWERS 5  A STOP-Bang score of 2 or less is  considered low risk, and a score of 5 or more is high risk for having either moderate or severe OSA. For people who score 3 or 4, doctors may need to perform further assessment to determine how likely they are to have OSA.         EPWORTH SLEEPINESS SCALE:  Scale:  (0)= no chance of dozing; (1)= slight chance of dozing; (2)= moderate chance of dozing; (3)= high chance of dozing  Chance  Situtation    Sitting and reading: 0    Watching TV: 0    Sitting Inactive in public: 0    As a passenger in car: 1      Lying down to rest: 3    Sitting and talking: 1    Sitting quielty after lunch: 3    In a car, stopped in traffic: 0   TOTAL SCORE:   8 out of 24    SLEEP STUDIES:  None   LABS: Recent Results (from the past 2160 hour(s))  Lipase, blood     Status: None   Collection Time: 12/10/22 10:40 PM  Result Value Ref Range   Lipase 23 11 - 51 U/L    Comment: Performed at [facility], [address] [place], [place] [address]  Comprehensive metabolic panel     Status: Abnormal   Collection Time: 12/10/22 10:40 PM  Result Value Ref Range   Sodium 135 135 - 145 mmol/L   Potassium 3.7 3.5 - 5.1 mmol/L   Chloride 101 98 - 111 mmol/L   CO2 20 (L) 22 - 32 mmol/L   Glucose, Bld 142 (H) 70 - 99 mg/dL    Comment: Glucose reference range applies only to samples taken after fasting for at least 8 hours.   BUN 10 6 - 20 mg/dL   Creatinine, Ser 6.04 0.44 - 1.00 mg/dL   Calcium 9.5 8.9 - 54.0 mg/dL   Total Protein 7.5 6.5 - 8.1 g/dL   Albumin 3.7 3.5 - 5.0 g/dL   AST 29 15 - 41 U/L   ALT 16 0 - 44 U/L   Alkaline Phosphatase 114 38 - 126 U/L   Total Bilirubin 0.6 0.3 - 1.2 mg/dL   GFR, Estimated >98 >11 mL/min    Comment: (NOTE) Calculated using the CKD-EPI Creatinine Equation (2021)    Anion gap 14 5 - 15    Comment: Performed at [facility], [address] [place], [place] [address]  Troponin I (High Sensitivity)     Status: None   Collection Time:  12/10/22 10:40 PM  Result Value Ref Range   Troponin I (High Sensitivity) 6 <18 ng/L    Comment: (NOTE) Elevated high sensitivity troponin I (hsTnI) values and significant  changes across serial measurements may suggest ACS but many other  chronic and acute conditions are known to elevate hsTnI results.  Refer to the "Links" section for chest pain algorithms and additional  guidance. Performed at [facility], [address] [place], [place] [address]   CBC with Differential/Platelet     Status: Abnormal   Collection Time: 12/11/22  1:00 AM  Result Value Ref Range   WBC 16.6 (H) 4.0 - 10.5 K/uL   RBC 5.12 (H) 3.87 - 5.11 MIL/uL   Hemoglobin 14.2 12.0 - 15.0 g/dL   HCT 13.0 86.5 - 78.4 %   MCV 85.0 80.0 - 100.0 fL   MCH 27.7 26.0 - 34.0 pg  MCHC 32.6 30.0 - 36.0 g/dL   RDW 82.9 56.2 - 13.0 %   Platelets 295 150 - 400 K/uL   nRBC 0.0 0.0 - 0.2 %   Neutrophils Relative % 80 %   Neutro Abs 13.2 (H) 1.7 - 7.7 K/uL   Lymphocytes Relative 12 %   Lymphs Abs 1.9 0.7 - 4.0 K/uL   Monocytes Relative 8 %   Monocytes Absolute 1.3 (H) 0.1 - 1.0 K/uL   Eosinophils Relative 0 %   Eosinophils Absolute 0.0 0.0 - 0.5 K/uL   Basophils Relative 0 %   Basophils Absolute 0.1 0.0 - 0.1 K/uL   Immature Granulocytes 0 %   Abs Immature Granulocytes 0.07 0.00 - 0.07 K/uL    Comment: Performed at [facility], [address] [place], [place] [address]  Comprehensive metabolic panel     Status: Abnormal   Collection Time: 12/11/22  8:28 AM  Result Value Ref Range   Sodium 139 135 - 145 mmol/L   Potassium 3.3 (L) 3.5 - 5.1 mmol/L   Chloride 104 98 - 111 mmol/L   CO2 24 22 - 32 mmol/L   Glucose, Bld 99 70 - 99 mg/dL    Comment: Glucose reference range applies only to samples taken after fasting for at least 8 hours.   BUN 9 6 - 20 mg/dL   Creatinine, Ser 4.69 0.44 - 1.00 mg/dL   Calcium 8.6 (L) 8.9 - 10.3 mg/dL   Total Protein 6.5 6.5 - 8.1 g/dL   Albumin 3.2 (L) 3.5 - 5.0  g/dL   AST 14 (L) 15 - 41 U/L   ALT 14 0 - 44 U/L   Alkaline Phosphatase 95 38 - 126 U/L   Total Bilirubin 0.8 0.3 - 1.2 mg/dL   GFR, Estimated >62 >95 mL/min    Comment: (NOTE) Calculated using the CKD-EPI Creatinine Equation (2021)    Anion gap 11 5 - 15    Comment: Performed at [facility], [address] [place], [place] [address]  CBC     Status: Abnormal   Collection Time: 12/11/22  8:28 AM  Result Value Ref Range   WBC 16.8 (H) 4.0 - 10.5 K/uL   RBC 4.94 3.87 - 5.11 MIL/uL   Hemoglobin 13.6 12.0 - 15.0 g/dL   HCT 24.4 01.0 - 27.2 %   MCV 85.8 80.0 - 100.0 fL   MCH 27.5 26.0 - 34.0 pg   MCHC 32.1 30.0 - 36.0 g/dL   RDW 53.6 64.4 - 03.4 %   Platelets 282 150 - 400 K/uL   nRBC 0.0 0.0 - 0.2 %    Comment: Performed at [facility], [address], [place], [place] [address]  Surgical pathology     Status: None   Collection Time: 12/12/22 12:00 AM  Result Value Ref Range   SURGICAL PATHOLOGY      SURGICAL PATHOLOGY [facility] [address], [address] [place], [place] [address] Telephone [phone] or [phone] Fax [phone]  REPORT OF SURGICAL PATHOLOGY   Accession #: [id number] Patient Name: NILI, WARMKESSEL Visit # : [id number]  MRN: [id number] Physician: Sterling Big DOB/Age March 10, 1986 (Age: 28) Gender: F Collected Date: 12/12/2022 Received Date: 12/13/2022  FINAL DIAGNOSIS       1. Gallbladder,  :       - CHRONIC, FOCALLY ACUTE CHOLECYSTITIS WITH CHOLELITHIASIS       DATE SIGNED OUT: 12/15/2022 ELECTRONIC SIGNATURE : Corey Harold M.D., Dossie Arbour., Pathologist, Electronic  Signature  MICROSCOPIC DESCRIPTION  CASE COMMENTS STAINS USED IN DIAGNOSIS: H&E    CLINICAL HISTORY  SPECIMEN(S) OBTAINED 1. Gallbladder,  SPECIMEN COMMENTS: SPECIMEN CLINICAL INFORMATION: 1. Cholelithiasis    Gross Description 1. Size/?Intact: Received fresh is an intact gallbladder measuring 10.0 x 4.4 x 4.3 cm.       Serosal surface:  Pink tan and smooth to roughened      Mucosa/Wall: Green to pink-red and minimally flattened with a wall thickness of      0.2 cm.      Contents: Include blood tinged, yellow green, viscous bile and three      green-black, round, roughened calculi ranging from 1.5 to 2.1 cm.      Cystic duct: Probed and found to be obstructed by one of the previously      mentioned calculi.      Block Summary: Representative wall and the cystic duct margin are submitted in      one block. (KW:gt, 12/13/22)        Report signed out from the following location(s) [place] [address], [place] [address] CLIA #: [id number]  [facility] [address] [place], [place] [address] CLIA #: [id number]   Urinalysis, Routine w reflex microscopic -Urine, Clean Catch     Status: Abnormal   Collection Time: 12/12/22  3:11 AM  Result Value Ref Range   Color, Urine STRAW (A) YELLOW   APPearance CLEAR (A) CLEAR   Specific Gravity, Urine 1.003 (L) 1.005 - 1.030   pH 7.0 5.0 - 8.0   Glucose, UA 150 (A) NEGATIVE mg/dL   Hgb urine dipstick NEGATIVE NEGATIVE   Bilirubin Urine NEGATIVE NEGATIVE   Ketones, ur NEGATIVE NEGATIVE mg/dL   Protein, ur NEGATIVE NEGATIVE mg/dL   Nitrite NEGATIVE NEGATIVE   Leukocytes,Ua NEGATIVE NEGATIVE    Comment: Performed at [facility], [address] [place], [place] [address]  Urine Drug Screen, Qualitative ([facility] only)     Status: Abnormal   Collection Time: 12/12/22  3:11 AM  Result Value Ref Range   Tricyclic, Ur Screen NONE DETECTED NONE DETECTED   Amphetamines, Ur Screen NONE DETECTED NONE DETECTED   MDMA (Ecstasy)Ur Screen NONE DETECTED NONE DETECTED   Cocaine Metabolite,Ur [place] NONE DETECTED NONE DETECTED   Opiate, Ur Screen POSITIVE (A) NONE DETECTED   Phencyclidine (PCP) Ur S NONE DETECTED NONE DETECTED   Cannabinoid 50 Ng, Ur [place] NONE DETECTED NONE DETECTED   Barbiturates, Ur Screen NONE  DETECTED NONE DETECTED   Benzodiazepine, Ur Scrn NONE DETECTED NONE DETECTED   Methadone Scn, Ur NONE DETECTED NONE DETECTED    Comment: (NOTE) Tricyclics + metabolites, urine    Cutoff 1000 ng/mL Amphetamines + metabolites, urine  Cutoff 1000 ng/mL MDMA (Ecstasy), urine              Cutoff 500 ng/mL Cocaine Metabolite, urine          Cutoff 300 ng/mL Opiate + metabolites, urine        Cutoff 300 ng/mL Phencyclidine (PCP), urine         Cutoff 25 ng/mL Cannabinoid, urine                 Cutoff 50 ng/mL Barbiturates + metabolites, urine  Cutoff 200 ng/mL Benzodiazepine, urine              Cutoff 200 ng/mL Methadone, urine  Cutoff 300 ng/mL  The urine drug screen provides only a preliminary, unconfirmed analytical test result and should not be used for non-medical purposes. Clinical consideration and professional judgment should be applied to any positive drug screen result due to possible interfering substances. A more specific alternate chemical method must be used in order to obtain a confirmed analytical result. Gas chromatography / mass spectrometry (GC/MS) is the preferred confirm atory method. Performed at [facility], [address] [place], [place] [address]   CBC     Status: Abnormal   Collection Time: 12/12/22  4:16 AM  Result Value Ref Range   WBC 14.3 (H) 4.0 - 10.5 K/uL   RBC 4.59 3.87 - 5.11 MIL/uL   Hemoglobin 12.9 12.0 - 15.0 g/dL   HCT 19.1 47.8 - 29.5 %   MCV 86.9 80.0 - 100.0 fL   MCH 28.1 26.0 - 34.0 pg   MCHC 32.3 30.0 - 36.0 g/dL   RDW 62.1 30.8 - 65.7 %   Platelets 236 150 - 400 K/uL   nRBC 0.0 0.0 - 0.2 %    Comment: Performed at [facility], [address], [place], [place] [address]  Comprehensive metabolic panel     Status: Abnormal   Collection Time: 12/12/22  4:16 AM  Result Value Ref Range   Sodium 136 135 - 145 mmol/L   Potassium 3.9 3.5 - 5.1 mmol/L   Chloride 101 98 - 111 mmol/L   CO2 28 22 - 32  mmol/L   Glucose, Bld 105 (H) 70 - 99 mg/dL    Comment: Glucose reference range applies only to samples taken after fasting for at least 8 hours.   BUN <5 (L) 6 - 20 mg/dL   Creatinine, Ser 2.95 0.44 - 1.00 mg/dL   Calcium 8.5 (L) 8.9 - 10.3 mg/dL   Total Protein 6.3 (L) 6.5 - 8.1 g/dL   Albumin 3.1 (L) 3.5 - 5.0 g/dL   AST 14 (L) 15 - 41 U/L   ALT 11 0 - 44 U/L   Alkaline Phosphatase 81 38 - 126 U/L   Total Bilirubin 0.7 0.3 - 1.2 mg/dL   GFR, Estimated >28 >41 mL/min    Comment: (NOTE) Calculated using the CKD-EPI Creatinine Equation (2021)    Anion gap 7 5 - 15    Comment: Performed at [facility], [address] [place], [place] [address]  Comprehensive metabolic panel     Status: Abnormal   Collection Time: 12/13/22  4:20 AM  Result Value Ref Range   Sodium 138 135 - 145 mmol/L   Potassium 3.7 3.5 - 5.1 mmol/L   Chloride 103 98 - 111 mmol/L   CO2 28 22 - 32 mmol/L   Glucose, Bld 120 (H) 70 - 99 mg/dL    Comment: Glucose reference range applies only to samples taken after fasting for at least 8 hours.   BUN <5 (L) 6 - 20 mg/dL   Creatinine, Ser 1.02 0.44 - 1.00 mg/dL   Calcium 8.9 8.9 - 72.5 mg/dL   Total Protein 6.6 6.5 - 8.1 g/dL   Albumin 3.1 (L) 3.5 - 5.0 g/dL   AST 39 15 - 41 U/L   ALT 24 0 - 44 U/L   Alkaline Phosphatase 80 38 - 126 U/L   Total Bilirubin 0.6 0.3 - 1.2 mg/dL   GFR, Estimated >36 >64 mL/min    Comment: (NOTE) Calculated using the CKD-EPI Creatinine Equation (2021)    Anion gap 7  5 - 15    Comment: Performed at [facility], [address] [place], [place] [address]  CBC     Status: Abnormal   Collection Time: 12/13/22  7:50 AM  Result Value Ref Range   WBC 15.5 (H) 4.0 - 10.5 K/uL   RBC 4.39 3.87 - 5.11 MIL/uL   Hemoglobin 12.5 12.0 - 15.0 g/dL   HCT 08.6 57.8 - 46.9 %   MCV 86.1 80.0 - 100.0 fL   MCH 28.5 26.0 - 34.0 pg   MCHC 33.1 30.0 - 36.0 g/dL   RDW 62.9 52.8 - 41.3 %   Platelets 253 150 - 400 K/uL   nRBC 0.0  0.0 - 0.2 %    Comment: Performed at [facility], [address] [place], [place] [address]    Radiology: US Venous Img Upper Uni Left  Result Date: 12/24/2022 CLINICAL DATA:  Swelling in the left arm EXAM: LEFT UPPER EXTREMITY VENOUS DOPPLER ULTRASOUND TECHNIQUE: Gray-scale sonography with graded compression, as well as color Doppler and duplex ultrasound were performed to evaluate the upper extremity deep venous system from the level of the subclavian vein and including the jugular, axillary, basilic, radial, ulnar and upper cephalic vein. Spectral Doppler was utilized to evaluate flow at rest and with distal augmentation maneuvers. COMPARISON:  None Available. FINDINGS: Contralateral Subclavian Vein: Respiratory phasicity is normal and symmetric with the symptomatic side. No evidence of thrombus. Normal compressibility. Internal Jugular Vein: No evidence of thrombus. Normal compressibility, respiratory phasicity and response to augmentation. Subclavian Vein: No evidence of thrombus. Normal compressibility, respiratory phasicity and response to augmentation. Axillary Vein: No evidence of thrombus. Normal compressibility, respiratory phasicity and response to augmentation. Cephalic Vein: Thrombus is noted with decreased compressibility. This extends from the antecubital region in an area of previous IV site. Basilic Vein: No evidence of thrombus. Normal compressibility, respiratory phasicity and response to augmentation. Brachial Veins: No evidence of thrombus. Normal compressibility, respiratory phasicity and response to augmentation. Radial Veins: No evidence of thrombus. Normal compressibility, respiratory phasicity and response to augmentation. Ulnar Veins: No evidence of thrombus. Normal compressibility, respiratory phasicity and response to augmentation. Venous Reflux:  None visualized. Other Findings:  None visualized. IMPRESSION: Acute occlusive thrombus in the cephalic vein extending  from the antecubital region to the upper arm. Electronically Signed   By: Alcide Clever M.D.   On: 12/24/2022 03:52    No results found.  No results found.    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 Witnessed episode of apnea Patient evaluation suggests high risk of sleep disordered breathing due to witnessed apnea, headaches, brain fog, daytime sleepiness, frequent awakenings, gasping/choking.  Patient has comorbid cardiovascular risk factors including: coronary artery disease, cardiomyopathy (ejection fraction 25%)  which could be exacerbated by pathologic sleep-disordered breathing.  Suggest: PSG to assess/treat the patient's sleep disordered breathing. The patient was also counselled on weight loss to optimize sleep health.  2. Cardiomyopathy,  unspecified type (HCC) Severe, with ejection fraction of  30% May 2024. Continue medications and follow up with cardiology.     General Counseling: I have discussed the findings of the evaluation and examination with Robi.  I have also discussed any further diagnostic evaluation thatmay be needed or ordered today. Consuello verbalizes understanding of the findings of todays visit. We also reviewed her medications today and discussed drug interactions and side effects including but not limited excessive drowsiness and altered mental states. We also discussed that there is always a risk not just to her but also people around her. she has been encouraged to call the office with any questions or concerns that should arise related to todays visit.  No orders of the defined types were placed in this encounter.       I have personally obtained a history, evaluated the patient, evaluated pertinent data, formulated the assessment and plan and placed orders.   This patient was seen today by Emmaline Kluver, PA-C in collaboration with Dr. Freda Munro.   Yevonne Pax, MD [facility] Diplomate ABMS Pulmonary and Critical Care Medicine Sleep medicine

## 2023-02-21 ENCOUNTER — Other Ambulatory Visit: Payer: Self-pay | Admitting: Family

## 2023-02-21 ENCOUNTER — Ambulatory Visit (INDEPENDENT_AMBULATORY_CARE_PROVIDER_SITE_OTHER): Payer: MEDICAID | Admitting: Internal Medicine

## 2023-02-21 VITALS — BP 107/83 | HR 100 | Resp 16 | Ht 67.0 in | Wt 233.8 lb

## 2023-02-21 DIAGNOSIS — R0681 Apnea, not elsewhere classified: Secondary | ICD-10-CM

## 2023-02-21 DIAGNOSIS — I429 Cardiomyopathy, unspecified: Secondary | ICD-10-CM

## 2023-02-24 ENCOUNTER — Ambulatory Visit: Payer: MEDICAID | Attending: Medical

## 2023-02-24 ENCOUNTER — Other Ambulatory Visit: Payer: Self-pay | Admitting: Family

## 2023-02-24 DIAGNOSIS — I429 Cardiomyopathy, unspecified: Secondary | ICD-10-CM | POA: Diagnosis not present

## 2023-02-24 LAB — ECHOCARDIOGRAM COMPLETE
Area-P 1/2: 4.15 cm2
S' Lateral: 4 cm

## 2023-02-24 NOTE — Telephone Encounter (Signed)
Patient has a question about her metoprolol succinate. Pt said that she is unsure what dosage she is supposed to be on despite her list showing 25mg  1x daily. Patient came into office asking for someone to please call and speak with her. Thank you.

## 2023-02-28 ENCOUNTER — Ambulatory Visit: Payer: MEDICAID | Admitting: Family

## 2023-02-28 ENCOUNTER — Other Ambulatory Visit: Payer: Self-pay | Admitting: Family

## 2023-02-28 ENCOUNTER — Encounter: Payer: Self-pay | Admitting: Family

## 2023-02-28 VITALS — BP 120/80 | HR 88 | Ht 67.0 in | Wt 238.6 lb

## 2023-02-28 DIAGNOSIS — E0590 Thyrotoxicosis, unspecified without thyrotoxic crisis or storm: Secondary | ICD-10-CM

## 2023-02-28 DIAGNOSIS — N760 Acute vaginitis: Secondary | ICD-10-CM

## 2023-02-28 DIAGNOSIS — R7303 Prediabetes: Secondary | ICD-10-CM

## 2023-02-28 DIAGNOSIS — E785 Hyperlipidemia, unspecified: Secondary | ICD-10-CM

## 2023-02-28 DIAGNOSIS — Z0130 Encounter for examination of blood pressure without abnormal findings: Secondary | ICD-10-CM

## 2023-02-28 DIAGNOSIS — E538 Deficiency of other specified B group vitamins: Secondary | ICD-10-CM

## 2023-02-28 DIAGNOSIS — L659 Nonscarring hair loss, unspecified: Secondary | ICD-10-CM

## 2023-02-28 DIAGNOSIS — E559 Vitamin D deficiency, unspecified: Secondary | ICD-10-CM

## 2023-02-28 DIAGNOSIS — E782 Mixed hyperlipidemia: Secondary | ICD-10-CM

## 2023-02-28 DIAGNOSIS — R5383 Other fatigue: Secondary | ICD-10-CM

## 2023-02-28 MED ORDER — MONTELUKAST SODIUM 10 MG PO TABS: 10.0000 mg | ORAL_TABLET | Freq: Every day | ORAL | 1 refills | Status: DC

## 2023-02-28 MED ORDER — ELIQUIS 5 MG PO TABS: 5.0000 mg | ORAL_TABLET | Freq: Two times a day (BID) | ORAL | 1 refills | Status: DC

## 2023-02-28 MED ORDER — SPIRONOLACTONE 25 MG PO TABS: 25.0000 mg | ORAL_TABLET | Freq: Every day | ORAL | 1 refills | Status: DC

## 2023-02-28 MED ORDER — FLUCONAZOLE 150 MG PO TABS: 150.0000 mg | ORAL_TABLET | Freq: Every day | ORAL | 0 refills | Status: DC

## 2023-02-28 MED ORDER — DAPAGLIFLOZIN PROPANEDIOL 10 MG PO TABS: 10.0000 mg | ORAL_TABLET | Freq: Every day | ORAL | 3 refills | Status: DC

## 2023-02-28 MED ORDER — CETIRIZINE HCL 10 MG PO TABS: 10.0000 mg | ORAL_TABLET | Freq: Every day | ORAL | 1 refills | Status: DC

## 2023-02-28 MED ORDER — ATORVASTATIN CALCIUM 40 MG PO TABS: 40.0000 mg | ORAL_TABLET | Freq: Every day | ORAL | 0 refills | Status: DC

## 2023-03-01 ENCOUNTER — Telehealth: Payer: Self-pay

## 2023-03-01 ENCOUNTER — Other Ambulatory Visit: Payer: Self-pay

## 2023-03-01 DIAGNOSIS — I5022 Chronic systolic (congestive) heart failure: Secondary | ICD-10-CM

## 2023-03-01 NOTE — Telephone Encounter (Signed)
I've not heard of pharmacies having problems getting Elizabeth Spencer in stock.  I called [facility] at [facility] and they have it in stock.  See if the patient is willing to fill there.   (Is she needing the "generic" - that is made by the brand manufacturer?) - most insurances still require brand as the patent hasn't expired

## 2023-03-01 NOTE — Telephone Encounter (Signed)
Pt stating her insurance will only approve the generic form

## 2023-03-01 NOTE — Telephone Encounter (Signed)
I spoke with the patient, who mentioned that she is having difficulty obtaining her [place] prescription. She stated that her mail-order pharmacy indicated they don't have a manufacturer for [place], and [facility] won't have it in stock until Thursday. The patient mentioned that she has already missed a dose today and is concerned that it may be Thursday before she can get her medication. She is asking if it would be safe to miss three doses. Additionally, she is inquiring if the Pharm D knows of a delivery pharmacy that typically keeps this medication in stock.

## 2023-03-02 ENCOUNTER — Other Ambulatory Visit: Payer: Self-pay

## 2023-03-02 MED ORDER — DAPAGLIFLOZIN PROPANEDIOL 10 MG PO TABS
10.0000 mg | ORAL_TABLET | Freq: Every day | ORAL | 3 refills | Status: DC
  Filled 2023-03-02: qty 90, 90d supply, fill #0

## 2023-03-02 NOTE — Telephone Encounter (Signed)
Sent to [facility] who has medication in stock. Put on notes to try to run as generic.

## 2023-03-03 LAB — NUSWAB VAGINITIS PLUS (VG+)
Candida albicans, NAA: NEGATIVE
Candida glabrata, NAA: NEGATIVE
Chlamydia trachomatis, NAA: NEGATIVE
Neisseria gonorrhoeae, NAA: NEGATIVE
Trich vag by NAA: NEGATIVE

## 2023-03-08 NOTE — Telephone Encounter (Signed)
Attempted to contact pt. Unable to leave message as mailbox is full.  °

## 2023-03-08 NOTE — Telephone Encounter (Signed)
Returned the call to the patient. She stated that she was able to pick up the medication and received three bottles, each with a 90-day supply. The patient stated she should be good for the next nine months.

## 2023-03-08 NOTE — Telephone Encounter (Signed)
  The patient is returning the call. She said she was able to get her medication at [facility], and her friend can pick it up for her. She also mentioned that if obtaining the medication becomes a hassle, she would prefer to switch to a different medication. She stated that all of her medications should be sent to [facility] because they deliver her medications, as she doesn't drive. She added that she can't afford to spend $15 on [employer] every time she needs a refill. She would like to receive a callback today.

## 2023-04-04 ENCOUNTER — Other Ambulatory Visit: Payer: Self-pay | Admitting: Family

## 2023-04-04 MED ORDER — METHIMAZOLE 10 MG PO TABS: 10.0000 mg | ORAL_TABLET | Freq: Three times a day (TID) | ORAL | 1 refills | Status: DC

## 2023-04-15 ENCOUNTER — Telehealth: Payer: Self-pay | Admitting: Medical

## 2023-04-15 ENCOUNTER — Telehealth: Payer: Self-pay | Admitting: Family

## 2023-04-15 ENCOUNTER — Encounter: Payer: Self-pay | Admitting: *Deleted

## 2023-04-15 NOTE — Telephone Encounter (Signed)
 The patient was calling, upset, that she felt like she was not educated on what sodium was and what foods she should be avoiding. She stated that she did not know that sodium was salt and has been eating food high in sodium. She stated that she drinks 3-4 cans of soda a day and 3-4 cans of juice a daily. She has been advised to cut back on this and add more water.  She has been educated on what a low salt diet consists of and that we will mail her information on this for her.

## 2023-04-15 NOTE — Telephone Encounter (Signed)
 Pt called in asking to speak with pt about her sodium intake

## 2023-04-15 NOTE — Telephone Encounter (Signed)
 Patient left VM wanting to get her endo labs done here when she has labs drawn for [facility]. We cannot do that. She also states that she has other questions and requests a call back.

## 2023-04-18 NOTE — Telephone Encounter (Signed)
 Sent mychart message

## 2023-04-19 ENCOUNTER — Institutional Professional Consult (permissible substitution): Payer: MEDICAID | Admitting: Cardiology

## 2023-04-19 ENCOUNTER — Other Ambulatory Visit: Payer: Self-pay | Admitting: Family

## 2023-04-19 ENCOUNTER — Other Ambulatory Visit: Payer: Self-pay

## 2023-04-19 DIAGNOSIS — E785 Hyperlipidemia, unspecified: Secondary | ICD-10-CM

## 2023-04-19 DIAGNOSIS — E538 Deficiency of other specified B group vitamins: Secondary | ICD-10-CM

## 2023-04-19 DIAGNOSIS — L659 Nonscarring hair loss, unspecified: Secondary | ICD-10-CM

## 2023-04-19 DIAGNOSIS — R5383 Other fatigue: Secondary | ICD-10-CM

## 2023-04-19 DIAGNOSIS — E0590 Thyrotoxicosis, unspecified without thyrotoxic crisis or storm: Secondary | ICD-10-CM

## 2023-04-19 DIAGNOSIS — E559 Vitamin D deficiency, unspecified: Secondary | ICD-10-CM

## 2023-04-19 DIAGNOSIS — E782 Mixed hyperlipidemia: Secondary | ICD-10-CM

## 2023-04-19 DIAGNOSIS — R7303 Prediabetes: Secondary | ICD-10-CM

## 2023-04-21 ENCOUNTER — Encounter: Payer: Self-pay | Admitting: Family

## 2023-04-22 ENCOUNTER — Other Ambulatory Visit: Payer: MEDICAID

## 2023-04-23 LAB — CBC WITH DIFFERENTIAL/PLATELET
Basophils Absolute: 0.1 10*3/uL (ref 0.0–0.2)
Basos: 1 %
EOS (ABSOLUTE): 0.3 10*3/uL (ref 0.0–0.4)
Eos: 4 %
Hematocrit: 47.6 % — ABNORMAL HIGH (ref 34.0–46.6)
Hemoglobin: 15.1 g/dL (ref 11.1–15.9)
Immature Grans (Abs): 0 10*3/uL (ref 0.0–0.1)
Immature Granulocytes: 0 %
Lymphocytes Absolute: 3.2 10*3/uL — ABNORMAL HIGH (ref 0.7–3.1)
Lymphs: 34 %
MCH: 28.9 pg (ref 26.6–33.0)
MCHC: 31.7 g/dL (ref 31.5–35.7)
MCV: 91 fL (ref 79–97)
Monocytes Absolute: 0.8 10*3/uL (ref 0.1–0.9)
Monocytes: 8 %
Neutrophils Absolute: 5 10*3/uL (ref 1.4–7.0)
Neutrophils: 53 %
Platelets: 285 10*3/uL (ref 150–450)
RBC: 5.22 x10E6/uL (ref 3.77–5.28)
RDW: 12.3 % (ref 11.7–15.4)
WBC: 9.4 10*3/uL (ref 3.4–10.8)

## 2023-04-23 LAB — LIPID PANEL
Chol/HDL Ratio: 2.6 {ratio} (ref 0.0–4.4)
Cholesterol, Total: 121 mg/dL (ref 100–199)
HDL: 47 mg/dL (ref >39)
LDL Chol Calc (NIH): 55 mg/dL (ref 0–99)
Triglycerides: 103 mg/dL (ref 0–149)
VLDL Cholesterol Cal: 19 mg/dL (ref 5–40)

## 2023-04-23 LAB — CMP14+EGFR
ALT: 18 [IU]/L (ref 0–32)
AST: 18 [IU]/L (ref 0–40)
Albumin: 3.9 g/dL (ref 3.9–4.9)
Alkaline Phosphatase: 102 [IU]/L (ref 44–121)
BUN/Creatinine Ratio: 8 — ABNORMAL LOW (ref 9–23)
BUN: 5 mg/dL — ABNORMAL LOW (ref 6–20)
Bilirubin Total: 0.2 mg/dL (ref 0.0–1.2)
CO2: 18 mmol/L — ABNORMAL LOW (ref 20–29)
Calcium: 9.1 mg/dL (ref 8.7–10.2)
Chloride: 104 mmol/L (ref 96–106)
Creatinine, Ser: 0.66 mg/dL (ref 0.57–1.00)
Globulin, Total: 2.4 g/dL (ref 1.5–4.5)
Glucose: 89 mg/dL (ref 70–99)
Potassium: 4.2 mmol/L (ref 3.5–5.2)
Sodium: 139 mmol/L (ref 134–144)
Total Protein: 6.3 g/dL (ref 6.0–8.5)
eGFR: 114 mL/min/{1.73_m2} (ref >59)

## 2023-04-23 LAB — VITAMIN D 25 HYDROXY (VIT D DEFICIENCY, FRACTURES): Vit D, 25-Hydroxy: 11.4 ng/mL — ABNORMAL LOW (ref 30.0–100.0)

## 2023-04-23 LAB — IRON,TIBC AND FERRITIN PANEL
Ferritin: 44 ng/mL (ref 15–150)
Iron Saturation: 15 % (ref 15–55)
Iron: 55 ug/dL (ref 27–159)
Total Iron Binding Capacity: 372 ug/dL (ref 250–450)
UIBC: 317 ug/dL (ref 131–425)

## 2023-04-23 LAB — HEMOGLOBIN A1C
Est. average glucose Bld gHb Est-mCnc: 105 mg/dL
Hgb A1c MFr Bld: 5.3 % (ref 4.8–5.6)

## 2023-04-23 LAB — TSH+T4F+T3FREE
Free T4: 1.47 ng/dL (ref 0.82–1.77)
T3, Free: 5.4 pg/mL — ABNORMAL HIGH (ref 2.0–4.4)
TSH: <0.005 u[IU]/mL — ABNORMAL LOW (ref 0.450–4.500)

## 2023-04-23 LAB — VITAMIN B12: Vitamin B-12: 403 pg/mL (ref 232–1245)

## 2023-04-27 ENCOUNTER — Ambulatory Visit: Payer: MEDICAID | Admitting: Family

## 2023-04-27 ENCOUNTER — Telehealth: Payer: Self-pay | Admitting: Family

## 2023-04-27 MED ORDER — UBRELVY 100 MG PO TABS: 100.0000 mg | ORAL_TABLET | ORAL | 1 refills | Status: DC | PRN

## 2023-04-27 NOTE — Telephone Encounter (Signed)
Patient called in c/o massive headaches from too much salt intake. Apparently she didn't know sodium was salt and said she is having massive headache from too much salt intake. She's asking if we can send in something for her headaches. Per Marchelle Folks the patient cannot take any rescue meds in the -triptan family because she has heart issues. Marchelle Folks sent [place] as that is the only safe option for her. Patient notified.  [facility]

## 2023-05-03 ENCOUNTER — Ambulatory Visit: Payer: MEDICAID | Admitting: Cardiology

## 2023-05-03 ENCOUNTER — Telehealth: Payer: Self-pay | Admitting: Medical

## 2023-05-03 MED ORDER — ASPIRIN 81 MG PO CHEW: 81.0000 mg | CHEWABLE_TABLET | Freq: Every day | ORAL | 0 refills | Status: DC

## 2023-05-03 NOTE — Telephone Encounter (Signed)
Requested Prescriptions   Signed Prescriptions Disp Refills   aspirin 81 MG chewable tablet 90 tablet 0    Sig: Chew 1 tablet (81 mg total) by mouth daily.    Authorizing Provider: Marianne Sofia    Ordering User: Guerry Minors

## 2023-05-03 NOTE — Telephone Encounter (Signed)
*  STAT* If patient is at the pharmacy, call can be transferred to refill team.   1. Which medications need to be refilled? (please list name of each medication and dose if known) aspirin 81 MG chewable tablet   2. Which pharmacy/location (including street and city if local pharmacy) is medication to be sent to? [facility] - [place], [place] - [address]  3. Do they need a 30 day or 90 day supply? 90 day

## 2023-05-03 NOTE — Telephone Encounter (Signed)
Last office visit: 02/02/23 with plan to f/u 3 months.  next office visit: none/NO active recall pt cx 05/10/23 appt with reason: Patient (does not want to further see her)  Has EP appt on 05/31/23

## 2023-05-08 ENCOUNTER — Encounter: Payer: Self-pay | Admitting: Family

## 2023-05-08 DIAGNOSIS — E559 Vitamin D deficiency, unspecified: Secondary | ICD-10-CM | POA: Insufficient documentation

## 2023-05-08 NOTE — Assessment & Plan Note (Signed)
 Checking labs today.  Will continue supplements as needed.

## 2023-05-08 NOTE — Assessment & Plan Note (Signed)
 Checking labs today.  Continue current therapy for lipid control. Will modify as needed based on labwork results.

## 2023-05-08 NOTE — Progress Notes (Signed)
Established Patient Office Visit  Subjective:  Patient ID: Elizabeth Spencer, female    DOB: 1986/03/30  Age: 38 y.o. MRN: [id number]  Chief Complaint  Patient presents with   Follow-up    Post yeast infection    Patient is here today for her 3 months follow up.  She has been feeling poorly since last appointment.   She does have additional concerns to discuss today.  She thinks she might have a yeast infection or something similar. She has noticed an odor and itching.  Needs refills for her medications.   Labs are due today. She needs refills.   I have reviewed her active problem list, medication list, allergies, notes from last encounter, lab results for her appointment today.     No other concerns at this time.   Past Medical History:  Diagnosis Date   Aneurysm (HCC) 01/04/2014   Arthritis    Asthma    WELL CONTROLLED   Brain aneurysm 2007   NEUROLOGY NOTE DOES NOT MENTION ANEURYSM BUT PT STATES SHE DID NOT HAVE TO HAVE SURGERY   Complication of anesthesia    FOR 1 C-SECTION PT WAS ITCHING AND VERY RED ON HER FACE   Cough, persistent 01/27/2016   Dermatitis due to sunburn 11/08/2013   Family history of adverse reaction to anesthesia    brother, neice and nephew got red in face with hives   Gallstones    GERD (gastroesophageal reflux disease)    Gonorrhea 06/20/2020   Headache    CHRONIC HEADACHES   History of chronic cough    DRY   Loss of memory 05/30/2017   Perforation of left tympanic membrane 05/17/2013   Pneumonia    PTSD (post-traumatic stress disorder)    PTSD (post-traumatic stress disorder)    Thyroid condition    PT WAS JUST TOLD ON 08-25-17 THAT SHE HAS A THYROID PROBLEM AND IS GOING TO F/U WITH ENDOCRINOLOGIST IN 2 WEEKS   Trichomonas vaginalis (TV) infection 06/20/2020    Past Surgical History:  Procedure Laterality Date   ABDOMINAL HYSTERECTOMY     CESAREAN SECTION     x3   CORONARY ARTERY BYPASS GRAFT N/A 09/02/2022   Procedure: CORONARY  ARTERY BYPASS GRAFTING (CABG) X 1 USING LEFT INTERNAL MAMMARY ARTERY;  Surgeon: Alleen Borne, MD;  Location: [facility] OR;  Service: Open Heart Surgery;  Laterality: N/A;   CYSTOSCOPY N/A 09/29/2017   Procedure: CYSTOSCOPY;  Surgeon: Vena Austria, MD;  Location: [facility] ORS;  Service: Gynecology;  Laterality: N/A;   CYSTOSCOPY N/A 11/05/2017   Procedure: CYSTOSCOPY;  Surgeon: Vena Austria, MD;  Location: [facility] ORS;  Service: Gynecology;  Laterality: N/A;   HYSTEROSCOPY WITH D & C N/A 09/01/2017   Procedure: DILATATION AND CURETTAGE /HYSTEROSCOPY;  Surgeon: Vena Austria, MD;  Location: [facility] ORS;  Service: Gynecology;  Laterality: N/A;   LAPAROSCOPY N/A 09/01/2017   Procedure: LAPAROSCOPY OPERATIVE with biopsy;  Surgeon: Vena Austria, MD;  Location: [facility] ORS;  Service: Gynecology;  Laterality: N/A;   LAPAROSCOPY N/A 11/05/2017   Procedure: LAPAROSCOPY DIAGNOSTIC;  Surgeon: Vena Austria, MD;  Location: [facility] ORS;  Service: Gynecology;  Laterality: N/A;   LEFT HEART CATH AND CORONARY ANGIOGRAPHY N/A 09/01/2022   Procedure: LEFT HEART CATH AND CORONARY ANGIOGRAPHY;  Surgeon: Yvonne Kendall, MD;  Location: [facility] INVASIVE CV LAB;  Service: Cardiovascular;  Laterality: N/A;   REPAIR VAGINAL CUFF N/A 11/05/2017   Procedure: REPAIR VAGINAL CUFF;  Surgeon: Vena Austria, MD;  Location: [facility] ORS;  Service: Gynecology;  Laterality: N/A;   TEE WITHOUT CARDIOVERSION N/A 09/02/2022   Procedure: TRANSESOPHAGEAL ECHOCARDIOGRAM;  Surgeon: Alleen Borne, MD;  Location: [facility] OR;  Service: Open Heart Surgery;  Laterality: N/A;   TOTAL LAPAROSCOPIC HYSTERECTOMY WITH SALPINGECTOMY Bilateral 09/29/2017   Procedure: TOTAL LAPAROSCOPIC HYSTERECTOMY WITH SALPINGECTOMY;  Surgeon: Vena Austria, MD;  Location: [facility] ORS;  Service: Gynecology;  Laterality: Bilateral;    Social History   Socioeconomic History   Marital status: Single    Spouse name: Not on file   Number of children: Not on file   Years of  education: Not on file   Highest education level: Not on file  Occupational History   Not on file  Tobacco Use   Smoking status: Every Day    Current packs/day: 0.50    Average packs/day: 0.5 packs/day for 18.0 years (9.0 ttl pk-yrs)    Types: Cigarettes    Passive exposure: Past   Smokeless tobacco: Never  Vaping Use   Vaping status: Never Used  Substance and Sexual Activity   Alcohol use: Yes    Alcohol/week: 0.0 standard drinks of alcohol    Comment: occasionally   Drug use: Not Currently    Types: Marijuana, Cocaine   Sexual activity: Yes    Partners: Male    Birth control/protection: Surgical    Comment: Hysterectomy  Other Topics Concern   Not on file  Social History Narrative   Not on file   Social Drivers of Health   Financial Resource Strain: Not on file  Food Insecurity: No Food Insecurity (12/11/2022)   Hunger Vital Sign    Worried About Running Out of Food in the Last Year: Never true    Ran Out of Food in the Last Year: Never true  Transportation Needs: No Transportation Needs (12/11/2022)   PRAPARE - Administrator, Civil Service (Medical): No    Lack of Transportation (Non-Medical): No  Physical Activity: Not on file  Stress: Not on file  Social Connections: Not on file  Intimate Partner Violence: Not At Risk (12/11/2022)   Humiliation, Afraid, Rape, and Kick questionnaire    Fear of Current or Ex-Partner: No    Emotionally Abused: No    Physically Abused: No    Sexually Abused: No    Family History  Problem Relation Age of Onset   Hypertension Mother    Alcohol abuse Mother    Deep vein thrombosis Mother    Alcohol abuse Father    COPD Father    Emphysema Father    Heart disease Father     Allergies  Allergen Reactions   Azithromycin Swelling and Other (See Comments)   Clindamycin Swelling   Codeine Hives and Other (See Comments)    Nausea/dizzy Nausea/dizzy   Meloxicam Other (See Comments)    Burns up from the inside     Neurontin [Gabapentin] Other (See Comments)    Makes her pass out and not remember what happened prior to taking med.   Penicillins Anaphylaxis    Patient allergic to all "cillins" Has patient had a PCN reaction causing immediate rash, facial/tongue/throat swelling, SOB or lightheadedness with hypotension: Yes Has patient had a PCN reaction causing severe rash involving mucus membranes or skin necrosis: Yes Has patient had a PCN reaction that required hospitalization: No Has patient had a PCN reaction occurring within the last 10 years: Yes If all of the above answers are "NO", then may proceed with Cephalosporin use.    Clindamycin/Lincomycin Swelling  Other Hives and Other (See Comments)    Berry flavored food and drinks   Diphenhydramine Hcl Rash and Other (See Comments)   Doxycycline Rash   Egg-Derived Products Rash    Review of Systems  All other systems reviewed and are negative.      Objective:   BP 120/80   Pulse 88   Ht 5\' 7"  (1.702 m)   Wt 238 lb 9.6 oz (108.2 kg)   LMP 08/22/2017 Comment: says she just spots.  SpO2 97%   BMI 37.37 kg/m   Vitals:   02/28/23 0917  BP: 120/80  Pulse: 88  Height: 5\' 7"  (1.702 m)  Weight: 238 lb 9.6 oz (108.2 kg)  SpO2: 97%  BMI (Calculated): 37.36    Physical Exam Vitals and nursing note reviewed.  Constitutional:      Appearance: Normal appearance. She is obese.  HENT:     Head: Normocephalic.  Eyes:     Extraocular Movements: Extraocular movements intact.     Conjunctiva/sclera: Conjunctivae normal.     Pupils: Pupils are equal, round, and reactive to light.  Cardiovascular:     Rate and Rhythm: Normal rate.  Pulmonary:     Effort: Pulmonary effort is normal.  Neurological:     General: No focal deficit present.     Mental Status: She is alert and oriented to person, place, and time. Mental status is at baseline.  Psychiatric:        Mood and Affect: Mood normal.        Behavior: Behavior normal.         Thought Content: Thought content normal.        Judgment: Judgment normal.      Results for orders placed or performed in visit on 02/28/23  NuSwab Vaginitis Plus (VG+)  Result Value Ref Range   Atopobium vaginae Low - 0 Score   BVAB 2 Low - 0 Score   Megasphaera 1 Low - 0 Score   Candida albicans, NAA Negative Negative   Candida glabrata, NAA Negative Negative   Trich vag by NAA Negative Negative   Chlamydia trachomatis, NAA Negative Negative   Neisseria gonorrhoeae, NAA Negative Negative    Recent Results (from the past 2160 hours)  ECHOCARDIOGRAM COMPLETE     Status: None   Collection Time: 02/24/23  2:44 PM  Result Value Ref Range   S' Lateral 4.00 cm   Area-P 1/2 4.15 cm2   Est EF 30 - 35%   NuSwab Vaginitis Plus (VG+)     Status: None   Collection Time: 03/01/23  9:09 AM  Result Value Ref Range   Atopobium vaginae Low - 0 Score   BVAB 2 Low - 0 Score   Megasphaera 1 Low - 0 Score    Comment: Calculate total score by adding the 3 individual bacterial vaginosis (BV) marker scores together.  Total score is interpreted as follows: Total score 0-1: Indicates the absence of BV. Total score   2: Indeterminate for BV. Additional clinical                  data should be evaluated to establish a                  diagnosis. Total score 3-6: Indicates the presence of BV.    Candida albicans, NAA Negative Negative   Candida glabrata, NAA Negative Negative   Trich vag by NAA Negative Negative   Chlamydia trachomatis, NAA Negative Negative  Neisseria gonorrhoeae, NAA Negative Negative  Iron, TIBC and Ferritin Panel     Status: None   Collection Time: 04/22/23 10:19 AM  Result Value Ref Range   Total Iron Binding Capacity 372 250 - 450 ug/dL   UIBC 191 478 - 295 ug/dL   Iron 55 27 - 621 ug/dL   Iron Saturation 15 15 - 55 %   Ferritin 44 15 - 150 ng/mL  Lipid panel     Status: None   Collection Time: 04/22/23 10:19 AM  Result Value Ref Range   Cholesterol, Total 121 100  - 199 mg/dL   Triglycerides 308 0 - 149 mg/dL   HDL 47 >65 mg/dL   VLDL Cholesterol Cal 19 5 - 40 mg/dL   LDL Chol Calc (NIH) 55 0 - 99 mg/dL   Chol/HDL Ratio 2.6 0.0 - 4.4 ratio    Comment:                                   T. Chol/HDL Ratio                                             Men  Women                               1/2 Avg.Risk  3.4    3.3                                   Avg.Risk  5.0    4.4                                2X Avg.Risk  9.6    7.1                                3X Avg.Risk 23.4   11.0   VITAMIN D 25 Hydroxy (Vit-D Deficiency, Fractures)     Status: Abnormal   Collection Time: 04/22/23 10:19 AM  Result Value Ref Range   Vit D, 25-Hydroxy 11.4 (L) 30.0 - 100.0 ng/mL    Comment: Vitamin D deficiency has been defined by the Institute of Medicine and an Endocrine Society practice guideline as a level of serum 25-OH vitamin D less than 20 ng/mL (1,2). The Endocrine Society went on to further define vitamin D insufficiency as a level between 21 and 29 ng/mL (2). 1. IOM (Institute of Medicine). 2010. Dietary reference    intakes for calcium and D. [place]: The    [employer]. 2. Holick MF, [place], Bischoff-Ferrari HA, et al.    Evaluation, treatment, and prevention of vitamin D    deficiency: an Endocrine Society clinical practice    guideline. JCEM. 2011 Jul; 96(7):1911-30.   CMP14+EGFR     Status: Abnormal   Collection Time: 04/22/23 10:19 AM  Result Value Ref Range   Glucose 89 70 - 99 mg/dL   BUN 5 (L) 6 - 20 mg/dL   Creatinine, Ser 7.84 0.57 - 1.00 mg/dL   eGFR 696 >29 BM/WUX/3.24   BUN/Creatinine  Ratio 8 (L) 9 - 23   Sodium 139 134 - 144 mmol/L   Potassium 4.2 3.5 - 5.2 mmol/L   Chloride 104 96 - 106 mmol/L   CO2 18 (L) 20 - 29 mmol/L   Calcium 9.1 8.7 - 10.2 mg/dL   Total Protein 6.3 6.0 - 8.5 g/dL   Albumin 3.9 3.9 - 4.9 g/dL   Globulin, Total 2.4 1.5 - 4.5 g/dL   Bilirubin Total 0.2 0.0 - 1.2 mg/dL   Alkaline  Phosphatase 102 44 - 121 IU/L   AST 18 0 - 40 IU/L   ALT 18 0 - 32 IU/L  Hemoglobin A1c     Status: None   Collection Time: 04/22/23 10:19 AM  Result Value Ref Range   Hgb A1c MFr Bld 5.3 4.8 - 5.6 %    Comment:          Prediabetes: 5.7 - 6.4          Diabetes: >6.4          Glycemic control for adults with diabetes: <7.0    Est. average glucose Bld gHb Est-mCnc 105 mg/dL  Vitamin N82     Status: None   Collection Time: 04/22/23 10:19 AM  Result Value Ref Range   Vitamin B-12 403 232 - 1,245 pg/mL  CBC with Diff     Status: Abnormal   Collection Time: 04/22/23 10:19 AM  Result Value Ref Range   WBC 9.4 3.4 - 10.8 x10E3/uL   RBC 5.22 3.77 - 5.28 x10E6/uL   Hemoglobin 15.1 11.1 - 15.9 g/dL   Hematocrit 95.6 (H) 21.3 - 46.6 %   MCV 91 79 - 97 fL   MCH 28.9 26.6 - 33.0 pg   MCHC 31.7 31.5 - 35.7 g/dL   RDW 08.6 57.8 - 46.9 %   Platelets 285 150 - 450 x10E3/uL   Neutrophils 53 Not Estab. %   Lymphs 34 Not Estab. %   Monocytes 8 Not Estab. %   Eos 4 Not Estab. %   Basos 1 Not Estab. %   Neutrophils Absolute 5.0 1.4 - 7.0 x10E3/uL   Lymphocytes Absolute 3.2 (H) 0.7 - 3.1 x10E3/uL   Monocytes Absolute 0.8 0.1 - 0.9 x10E3/uL   EOS (ABSOLUTE) 0.3 0.0 - 0.4 x10E3/uL   Basophils Absolute 0.1 0.0 - 0.2 x10E3/uL   Immature Granulocytes 0 Not Estab. %   Immature Grans (Abs) 0.0 0.0 - 0.1 x10E3/uL  TSH+T4F+T3Free     Status: Abnormal   Collection Time: 04/22/23 10:19 AM  Result Value Ref Range   TSH <0.005 (L) 0.450 - 4.500 uIU/mL   T3, Free 5.4 (H) 2.0 - 4.4 pg/mL   Free T4 1.47 0.82 - 1.77 ng/dL       Assessment & Plan:   Problem List Items Addressed This Visit       Endocrine   Hyperthyroidism   Patient is seen by Endocrinology , who manage this condition.  She is well controlled with current therapy.   Will defer to them for further changes to plan of care.       Relevant Orders   Lipid panel (Completed)   CMP14+EGFR (Completed)   TSH   CBC with Diff  (Completed)     Other   Dyslipidemia - Primary   Checking labs today.  Continue current therapy for lipid control. Will modify as needed based on labwork results.        Relevant Medications   atorvastatin (LIPITOR) 40 MG tablet  Other Relevant Orders   CMP14+EGFR (Completed)   TSH   CBC with Diff (Completed)   Vitamin D deficiency, unspecified   Checking labs today.  Will continue supplements as needed.        Relevant Orders   VITAMIN D 25 Hydroxy (Vit-D Deficiency, Fractures) (Completed)   CMP14+EGFR (Completed)   TSH   CBC with Diff (Completed)   Other Visit Diagnoses       Prediabetes       A1C is in prediabetic ranges. Patient counseled on dietary choices and verbalized understanding. Will reassess at follow up after next lab check.   Relevant Orders   CMP14+EGFR (Completed)   TSH   Hemoglobin A1c (Completed)   CBC with Diff (Completed)     Other fatigue       Relevant Orders   Iron, TIBC and Ferritin Panel (Completed)   CMP14+EGFR (Completed)   TSH   CBC with Diff (Completed)     Mixed hyperlipidemia       Relevant Medications   spironolactone (ALDACTONE) 25 MG tablet   ELIQUIS 5 MG TABS tablet   atorvastatin (LIPITOR) 40 MG tablet   Other Relevant Orders   CMP14+EGFR (Completed)   TSH   CBC with Diff (Completed)     B12 deficiency due to diet       Checking labs today.  Will continue supplements as needed.   Relevant Orders   CMP14+EGFR (Completed)   TSH   Vitamin B12 (Completed)   CBC with Diff (Completed)     Hair loss       Checking labs today.  Will continue supplements as needed.   Relevant Orders   Iron, TIBC and Ferritin Panel (Completed)     Acute vaginitis       Sending Nuswab for patient today. Will call with results when available.   Relevant Orders   NuSwab Vaginitis Plus (VG+) (Completed)       Return in about 1 month (around 03/30/2023).   Total time spent: 30 minutes  Miki Kins, FNP  02/28/2023   This  document may have been prepared by [facility] Voice Recognition software and as such may include unintentional dictation errors.

## 2023-05-08 NOTE — Assessment & Plan Note (Signed)
Patient is seen by Endocrinology, who manage this condition.  She is well controlled with current therapy.   Will defer to them for further changes to plan of care.

## 2023-05-10 ENCOUNTER — Ambulatory Visit: Payer: MEDICAID | Admitting: Medical

## 2023-05-11 ENCOUNTER — Telehealth: Payer: Self-pay | Admitting: Internal Medicine

## 2023-05-11 ENCOUNTER — Other Ambulatory Visit: Payer: Self-pay | Admitting: Family

## 2023-05-11 DIAGNOSIS — I5022 Chronic systolic (congestive) heart failure: Secondary | ICD-10-CM

## 2023-05-11 MED ORDER — DAPAGLIFLOZIN PROPANEDIOL 10 MG PO TABS: 10.0000 mg | ORAL_TABLET | Freq: Every day | ORAL | 0 refills | Status: DC

## 2023-05-11 MED ORDER — METOPROLOL SUCCINATE ER 25 MG PO TB24: 25.0000 mg | ORAL_TABLET | Freq: Every day | ORAL | 1 refills | Status: DC

## 2023-05-11 MED ORDER — CLOPIDOGREL BISULFATE 75 MG PO TABS: 75.0000 mg | ORAL_TABLET | Freq: Every day | ORAL | 0 refills | Status: DC

## 2023-05-11 NOTE — Telephone Encounter (Signed)
 Per chart review, metoprolol  was decreased to 25 mg daily on 02/07/23. Refill sent as requested

## 2023-05-11 NOTE — Addendum Note (Signed)
 Addended by: Chapman Commodore on: 05/11/2023 05:17 PM   Modules accepted: Orders

## 2023-05-11 NOTE — Telephone Encounter (Signed)
*  STAT* If patient is at the pharmacy, call can be transferred to refill team.   1. Which medications need to be refilled? (please list name of each medication and dose if known)   All medications  2. Would you like to learn more about the convenience, safety, & potential cost savings by using the [facility]?   3. Are you open to using the [facility] (Type [facility]. ).  4. Which pharmacy/location (including street and city if local pharmacy) is medication to be sent to?  [facility] [place], [place] - [address]   5. Do they need a 30 day or 90 day supply?  30 day   Patient stated she is changing pharmacies and wants all of her prescriptions transferred to [facility] [place], [place] - [address]   Patient stated she is completely out of some of her medications and the pharmacy will need to get the prescriptions by 1:00 pm today for delivery today.

## 2023-05-14 MED ORDER — SPIRONOLACTONE 25 MG PO TABS: 25.0000 mg | ORAL_TABLET | Freq: Every day | ORAL | 0 refills | Status: DC

## 2023-05-14 MED ORDER — ATORVASTATIN CALCIUM 40 MG PO TABS: 40.0000 mg | ORAL_TABLET | Freq: Every day | ORAL | 0 refills | Status: DC

## 2023-05-16 ENCOUNTER — Other Ambulatory Visit: Payer: Self-pay

## 2023-05-25 ENCOUNTER — Emergency Department: Payer: MEDICAID

## 2023-05-25 ENCOUNTER — Emergency Department
Admission: EM | Admit: 2023-05-25 | Discharge: 2023-05-25 | Disposition: A | Discharge status: Home / Self Care | Payer: MEDICAID | Attending: Emergency Medicine | Admitting: Emergency Medicine

## 2023-05-25 DIAGNOSIS — K219 Gastro-esophageal reflux disease without esophagitis: Secondary | ICD-10-CM | POA: Insufficient documentation

## 2023-05-25 DIAGNOSIS — R0789 Other chest pain: Secondary | ICD-10-CM | POA: Diagnosis present

## 2023-05-25 LAB — BASIC METABOLIC PANEL
Anion gap: 10 (ref 5–15)
BUN: 11 mg/dL (ref 6–20)
CO2: 22 mmol/L (ref 22–32)
Calcium: 8.8 mg/dL — ABNORMAL LOW (ref 8.9–10.3)
Chloride: 105 mmol/L (ref 98–111)
Creatinine, Ser: 0.87 mg/dL (ref 0.44–1.00)
GFR, Estimated: >60 mL/min (ref >60)
Glucose, Bld: 92 mg/dL (ref 70–99)
Potassium: 4.2 mmol/L (ref 3.5–5.1)
Sodium: 137 mmol/L (ref 135–145)

## 2023-05-25 LAB — RESP PANEL BY RT-PCR (RSV, FLU A&B, COVID)  RVPGX2
Influenza A by PCR: NEGATIVE
Influenza B by PCR: NEGATIVE
Resp Syncytial Virus by PCR: NEGATIVE
SARS Coronavirus 2 by RT PCR: NEGATIVE

## 2023-05-25 LAB — CBC
HCT: 48.8 % — ABNORMAL HIGH (ref 36.0–46.0)
Hemoglobin: 16.2 g/dL — ABNORMAL HIGH (ref 12.0–15.0)
MCH: 29.7 pg (ref 26.0–34.0)
MCHC: 33.2 g/dL (ref 30.0–36.0)
MCV: 89.5 fL (ref 80.0–100.0)
Platelets: 283 10*3/uL (ref 150–400)
RBC: 5.45 MIL/uL — ABNORMAL HIGH (ref 3.87–5.11)
RDW: 12.9 % (ref 11.5–15.5)
WBC: 13.3 10*3/uL — ABNORMAL HIGH (ref 4.0–10.5)
nRBC: 0 % (ref 0.0–0.2)

## 2023-05-25 LAB — TROPONIN I (HIGH SENSITIVITY)
Troponin I (High Sensitivity): 5 ng/L (ref <18)
Troponin I (High Sensitivity): 5 ng/L (ref <18)

## 2023-05-25 LAB — PROTIME-INR
INR: 1.1 (ref 0.8–1.2)
Prothrombin Time: 14.2 s (ref 11.4–15.2)

## 2023-05-25 LAB — D-DIMER, QUANTITATIVE: D-Dimer, Quant: 0.3 ug{FEU}/mL (ref 0.00–0.50)

## 2023-05-25 MED ORDER — SUCRALFATE 1 G PO TABS: 1.0000 g | ORAL_TABLET | Freq: Four times a day (QID) | ORAL | 0 refills | Status: DC

## 2023-05-25 MED ORDER — ALUM & MAG HYDROXIDE-SIMETH 200-200-20 MG/5ML PO SUSP
30.0000 mL | Freq: Once | ORAL | Status: AC
  Administered 2023-05-25: 30 mL via ORAL
  Filled 2023-05-25: qty 30

## 2023-05-25 MED ORDER — FAMOTIDINE 20 MG PO TABS
20.0000 mg | ORAL_TABLET | Freq: Every day | ORAL | 1 refills | Status: DC
Start: 2023-05-25 — End: 2023-06-24

## 2023-05-25 MED ORDER — SODIUM CHLORIDE 0.9 % IV BOLUS
1000.0000 mL | Freq: Once | INTRAVENOUS | Status: AC
  Administered 2023-05-25: 1000 mL via INTRAVENOUS

## 2023-05-25 MED ORDER — LIDOCAINE VISCOUS HCL 2 % MT SOLN
15.0000 mL | Freq: Once | OROMUCOSAL | Status: AC
  Administered 2023-05-25: 15 mL via ORAL
  Filled 2023-05-25: qty 15

## 2023-05-25 NOTE — ED Triage Notes (Signed)
Pt c/o L sided chest pain, SOB, cough, and chest congestion x6 days.  Pain score 6/10.  Pt reports having a cardiac bypass x 9 months ago.

## 2023-05-25 NOTE — ED Provider Notes (Signed)
[facility] Provider Note    Event Date/Time   First MD Initiated Contact with Patient 05/25/23 1629     (approximate)   History   Shortness of Breath, Chest Pain, and Nasal Congestion   HPI  Elizabeth Spencer is a 38 y.o. female who presents to the emergency department today with concerns for chest pain, shortness of breath, nasal congestion.  The patient states the symptoms have been ongoing for about 1 week.  She describes the chest pressure is being primarily located on the left side. It is worse after eating. She does have some concerns because she says she might have been exposed to cocaine.  Apparently her boyfriend had been storing cocaine and a box fan that she uses.  The patient denies any fevers. Patient does have significant cardiac history.     Physical Exam   Triage Vital Signs: ED Triage Vitals [05/25/23 1603]  Encounter Vitals Group     BP (!) 147/91     Systolic BP Percentile      Diastolic BP Percentile      Pulse Rate (!) 128     Resp (!) 22     Temp 98.2 F (36.8 C)     Temp Source Oral     SpO2 95 %     Weight 238 lb (108 kg)     Height 5\' 7"  (1.702 m)     Head Circumference      Peak Flow      Pain Score 6     Pain Loc      Pain Education      Exclude from Growth Chart     Most recent vital signs: Vitals:   05/25/23 1603 05/25/23 1700  BP: (!) 147/91 (!) 110/93  Pulse: (!) 128 (!) 111  Resp: (!) 22 15  Temp: 98.2 F (36.8 C)   SpO2: 95% 96%   General: Awake, alert, oriented. CV:  Good peripheral perfusion. Tachycardia. Resp:  Normal effort. Lungs clear. Abd:  No distention.    ED Results / Procedures / Treatments   Labs (all labs ordered are listed, but only abnormal results are displayed) Labs Reviewed  CBC - Abnormal; Notable for the following components:      Result Value   WBC 13.3 (*)    RBC 5.45 (*)    Hemoglobin 16.2 (*)    HCT 48.8 (*)    All other components within normal limits  BASIC  METABOLIC PANEL - Abnormal; Notable for the following components:   Calcium 8.8 (*)    All other components within normal limits  RESP PANEL BY RT-PCR (RSV, FLU A&B, COVID)  RVPGX2  PROTIME-INR  D-DIMER, QUANTITATIVE  TROPONIN I (HIGH SENSITIVITY)  TROPONIN I (HIGH SENSITIVITY)     EKG  I, Phineas Semen, attending physician, personally viewed and interpreted this EKG  EKG Time: 1606 Rate: 119 Rhythm: sinus tachycardia with PVC Axis: normal Intervals: qtc 413 QRS: narrow ST changes: no st elevation Impression: abnormal ekg   RADIOLOGY I independently interpreted and visualized the CXR. My interpretation: No pneumonia. Radiology interpretation:  IMPRESSION:  No acute cardiopulmonary findings.      PROCEDURES:  Critical Care performed: No    MEDICATIONS ORDERED IN ED: Medications - No data to display   IMPRESSION / MDM / ASSESSMENT AND PLAN / ED COURSE  I reviewed the triage vital signs and the nursing notes.  Differential diagnosis includes, but is not limited to, ACS, pneumonia, viral illness, PE  Patient's presentation is most consistent with acute presentation with potential threat to life or bodily function.   The patient is on the cardiac monitor to evaluate for evidence of arrhythmia and/or significant heart rate changes.  Patient presented to the emergency department today with concerns for chest pain, shortness of breath and nasal congestion.  On exam patient awake alert no acute distress.  Was slightly tachycardic.  Lungs were clear.  Chest x-ray without pneumonia.  Blood work with negative troponin and negative D-dimer.  Patient was given GI cocktail and did feel significant relief.  At this time I do think GERD and esophagitis likely.  I discussed this with the patient.  Will plan on changing patient's antiacid and adding sucralfate.  Will give dietary guidelines.      FINAL CLINICAL IMPRESSION(S) / ED DIAGNOSES    Final diagnoses:  Gastroesophageal reflux disease, unspecified whether esophagitis present     Note:  This document was prepared using Dragon voice recognition software and may include unintentional dictation errors.    Phineas Semen, MD 05/25/23 2040

## 2023-05-31 ENCOUNTER — Ambulatory Visit: Payer: MEDICAID | Admitting: Cardiology

## 2023-05-31 ENCOUNTER — Ambulatory Visit: Payer: MEDICAID | Admitting: Family

## 2023-05-31 NOTE — Progress Notes (Deleted)
  Electrophysiology Office Note:   Date:  05/31/2023  ID:  Elizabeth Spencer, DOB 07-20-1985, MRN [id number]  Primary Cardiologist: Yvonne Kendall, MD Electrophysiologist: Nobie Putnam, MD  {Click to update primary MD,subspecialty MD or APP then REFRESH:1}    History of Present Illness:   Elizabeth Spencer is a 38 y.o. 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  Patient presented to ED in May of 2024 with chest pain. Had LHC which showed hazy eccentric 90% ostial LAD stenosis concerning for acute plaque rupture, tubular 40% proximal RCA stenosis large circumflex without significant disease. EF noted to be 30-35%. She underwent single vessel CABG, LIMA-LAD on 09/02/22.  Discussed the use of AI scribe software for clinical note transcription with the patient, who gave verbal consent to proceed.  History of Present Illness            Review of systems complete and found to be negative unless listed in HPI.   EP Information / Studies Reviewed:    EKG is not ordered today. EKG from 05/25/23 reviewed which showed sinus tachycardia with PR and QRS 74ms.      Echo 02/24/23:  1. Left ventricular ejection fraction, by estimation, is 30 to 35%. The  left ventricle has moderately decreased function. The left ventricle  demonstrates global hypokinesis. Left ventricular diastolic parameters are  consistent with Grade I diastolic  dysfunction (impaired relaxation). The average left ventricular global  longitudinal strain is -9.4 %.   2. Right ventricular systolic function is normal. The right ventricular  size is normal. Tricuspid regurgitation signal is inadequate for assessing  PA pressure.   3. The mitral  valve is normal in structure. Moderate mitral valve  regurgitation. No evidence of mitral stenosis.   4. The aortic valve is normal in structure. Aortic valve regurgitation is  not visualized. No aortic stenosis is present.   5. The inferior vena cava is normal in size with greater than 50%  respiratory variability, suggesting right atrial pressure of 3 mmHg.        Physical Exam:   VS:  LMP 08/22/2017 Comment: says she just spots.   Wt Readings from Last 3 Encounters:  05/25/23 238 lb (108 kg)  02/28/23 238 lb 9.6 oz (108.2 kg)  02/21/23 233 lb 12.8 oz (106.1 kg)     GEN: Well nourished, well developed in no acute distress NECK: No JVD CARDIAC: {EPRHYTHM:28826}, no murmurs, rubs, gallops RESPIRATORY:  Clear to auscultation without rales, wheezing or rhonchi  ABDOMEN: Soft, non-distended EXTREMITIES:  No edema; No deformity   ASSESSMENT AND PLAN:   Elizabeth Spencer is a 38 y.o. 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               Follow up with {WGNFA:21308} {EPFOLLOW MV:78469}  Signed, Nobie Putnam, MD

## 2023-06-03 ENCOUNTER — Encounter: Payer: Self-pay | Admitting: Family

## 2023-06-03 ENCOUNTER — Ambulatory Visit: Payer: MEDICAID | Admitting: Family

## 2023-06-03 VITALS — BP 94/62 | HR 91 | Ht 67.0 in | Wt 248.6 lb

## 2023-06-03 DIAGNOSIS — R300 Dysuria: Secondary | ICD-10-CM | POA: Diagnosis not present

## 2023-06-03 DIAGNOSIS — D729 Disorder of white blood cells, unspecified: Secondary | ICD-10-CM

## 2023-06-03 DIAGNOSIS — J069 Acute upper respiratory infection, unspecified: Secondary | ICD-10-CM

## 2023-06-03 DIAGNOSIS — F1411 Cocaine abuse, in remission: Secondary | ICD-10-CM | POA: Diagnosis not present

## 2023-06-03 DIAGNOSIS — Z0130 Encounter for examination of blood pressure without abnormal findings: Secondary | ICD-10-CM

## 2023-06-03 LAB — POCT URINALYSIS DIPSTICK
Bilirubin, UA: NEGATIVE
Blood, UA: NEGATIVE
Glucose, UA: POSITIVE — AB
Ketones, UA: NEGATIVE
Leukocytes, UA: NEGATIVE
Nitrite, UA: NEGATIVE
Protein, UA: NEGATIVE
Spec Grav, UA: 1.02 (ref 1.010–1.025)
Urobilinogen, UA: 0.2 U/dL
pH, UA: 6 (ref 5.0–8.0)

## 2023-06-03 LAB — POC INFLUENZA A&B (BINAX/QUICKVUE)
Influenza A, POC: NEGATIVE
Influenza B, POC: NEGATIVE

## 2023-06-04 LAB — TSH+T4F+T3FREE
Free T4: 1.8 ng/dL — ABNORMAL HIGH (ref 0.82–1.77)
T3, Free: 7.1 pg/mL — ABNORMAL HIGH (ref 2.0–4.4)
TSH: <0.005 u[IU]/mL — ABNORMAL LOW (ref 0.450–4.500)

## 2023-06-07 ENCOUNTER — Telehealth: Payer: Self-pay | Admitting: Family

## 2023-06-07 NOTE — Telephone Encounter (Signed)
 Mandi from [facility] left VM that the patient told them that she is on two different water pills but they only have her down for one. What fluid pill(s) should the patient be on?

## 2023-06-08 LAB — CBC WITH DIFFERENTIAL/PLATELET
Basophils Absolute: 0.1 10*3/uL (ref 0.0–0.2)
Basos: 1 %
EOS (ABSOLUTE): 0.3 10*3/uL (ref 0.0–0.4)
Eos: 3 %
Hematocrit: 48.2 % — ABNORMAL HIGH (ref 34.0–46.6)
Hemoglobin: 15.9 g/dL (ref 11.1–15.9)
Immature Grans (Abs): 0 10*3/uL (ref 0.0–0.1)
Immature Granulocytes: 0 %
Lymphocytes Absolute: 3.2 10*3/uL — ABNORMAL HIGH (ref 0.7–3.1)
Lymphs: 33 %
MCH: 29.9 pg (ref 26.6–33.0)
MCHC: 33 g/dL (ref 31.5–35.7)
MCV: 91 fL (ref 79–97)
Monocytes Absolute: 0.7 10*3/uL (ref 0.1–0.9)
Monocytes: 7 %
Neutrophils Absolute: 5.3 10*3/uL (ref 1.4–7.0)
Neutrophils: 56 %
Platelets: 300 10*3/uL (ref 150–450)
RBC: 5.32 x10E6/uL — ABNORMAL HIGH (ref 3.77–5.28)
RDW: 12 % (ref 11.7–15.4)
WBC: 9.6 10*3/uL (ref 3.4–10.8)

## 2023-06-08 LAB — DRUG SCREEN 10 W/CONF, SERUM
Amphetamines, IA: NEGATIVE ng/mL
Barbiturates, IA: NEGATIVE ug/mL
Benzodiazepines, IA: NEGATIVE ng/mL
Cocaine & Metabolite, IA: NEGATIVE ng/mL
Methadone, IA: NEGATIVE ng/mL
Opiates, IA: NEGATIVE ng/mL
Oxycodones, IA: NEGATIVE ng/mL
Phencyclidine, IA: NEGATIVE ng/mL
Propoxyphene, IA: NEGATIVE ng/mL
THC(Marijuana) Metabolite, IA: NEGATIVE ng/mL

## 2023-06-08 LAB — RETICULOCYTES: Retic Ct Pct: 1.3 % (ref 0.6–2.6)

## 2023-06-14 ENCOUNTER — Ambulatory Visit: Payer: MEDICAID | Attending: Cardiology | Admitting: Cardiology

## 2023-06-14 VITALS — BP 115/79 | HR 101 | Ht 67.0 in | Wt 250.2 lb

## 2023-06-14 DIAGNOSIS — D6851 Activated protein C resistance: Secondary | ICD-10-CM

## 2023-06-14 DIAGNOSIS — I82622 Acute embolism and thrombosis of deep veins of left upper extremity: Secondary | ICD-10-CM

## 2023-06-14 DIAGNOSIS — I255 Ischemic cardiomyopathy: Secondary | ICD-10-CM | POA: Diagnosis not present

## 2023-06-14 DIAGNOSIS — I5022 Chronic systolic (congestive) heart failure: Secondary | ICD-10-CM | POA: Diagnosis not present

## 2023-06-14 DIAGNOSIS — I2510 Atherosclerotic heart disease of native coronary artery without angina pectoris: Secondary | ICD-10-CM

## 2023-06-14 MED ORDER — FUROSEMIDE 20 MG PO TABS: 20.0000 mg | ORAL_TABLET | Freq: Every day | ORAL | 2 refills | Status: DC | PRN

## 2023-06-14 NOTE — Progress Notes (Signed)
 Electrophysiology Office Note:   Date:  06/14/2023  ID:  Elizabeth Spencer, DOB 1985/06/23, MRN [id number]  Primary Cardiologist: Yvonne Kendall, MD Electrophysiologist: Nobie Putnam, MD      History of Present Illness:   Elizabeth Spencer is a 38 y.o. 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  Patient presented to ED in May of 2024 with chest pain. Had LHC which showed hazy eccentric 90% ostial LAD stenosis concerning for acute plaque rupture, tubular 40% proximal RCA stenosis large circumflex without significant disease. EF noted to be 30-35%. She underwent single vessel CABG, LIMA-LAD on 09/02/22.  Discussed the use of AI scribe software for clinical note transcription with the patient, who gave verbal consent to proceed.  History of Present Illness   The patient, with a history of heart disease, including a heart catheterization and bypass surgery, presents with concerns about her heart health. The patient also mentions experiencing shortness of breath, chest discomfort, and fluid retention at times. She expresses concern about her quality of life and the potential for future heart-related complications. The patient also mentions a history of left upper extremity blood clot and had previously been on Eliquis. She is currently on aspirin for blood thinning. She has recently stopped taking Eliquis. The patient also reports a recent weight gain, which she attributes to fluid retention. She denies any episodes of passing unless during heavy drinking.   Review of systems complete and found to be negative unless listed in HPI.   EP Information / Studies Reviewed:    EKG is not ordered today. EKG from 05/25/23 reviewed  which showed sinus tachycardia with PR and QRS 74ms.      Echo 02/24/23:  1. Left ventricular ejection fraction, by estimation, is 30 to 35%. The  left ventricle has moderately decreased function. The left ventricle  demonstrates global hypokinesis. Left ventricular diastolic parameters are  consistent with Grade I diastolic  dysfunction (impaired relaxation). The average left ventricular global  longitudinal strain is -9.4 %.   2. Right ventricular systolic function is normal. The right ventricular  size is normal. Tricuspid regurgitation signal is inadequate for assessing  PA pressure.   3. The mitral valve is normal in structure. Moderate mitral valve  regurgitation. No evidence of mitral stenosis.   4. The aortic valve is normal in structure. Aortic valve regurgitation is  not visualized. No aortic stenosis is present.   5. The inferior vena cava is normal in size with greater than 50%  respiratory variability, suggesting right atrial pressure of 3 mmHg.        Physical Exam:   VS:  BP 115/79   Pulse (!) 101   Ht 5\' 7"  (1.702 m)   Wt 250 lb 3.2 oz (113.5 kg)   LMP 08/22/2017 Comment: says she just spots.  SpO2 98%   BMI 39.19 kg/m    Wt Readings from Last 3 Encounters:  06/14/23 250 lb 3.2 oz (113.5 kg)  06/03/23 248 lb 9.6 oz (112.8 kg)  05/25/23 238 lb (108 kg)     GEN: Well nourished, well developed in no acute distress NECK: No JVD CARDIAC: Tachycardic, regular RESPIRATORY:  Clear to auscultation without rales, wheezing or rhonchi  ABDOMEN: Soft, non-distended EXTREMITIES:  No edema;  No deformity   ASSESSMENT AND PLAN:   Elizabeth Spencer is a 38 y.o. 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  #.  Chronic systolic heart failure: EF remains less than 35% greater than 90 days after revascularization despite optimal medical therapy. NYHA II symptoms. #.  Ischemic cardiomyopathy: -Patient meets criteria for primary prevention ICD. Explained risks, benefits, and alternatives to ICD implantation, including but not limited to bleeding, infection, damage to heart or lungs, heart attack, stroke, or death. We discussed S-ICD to minimize infectious risk and venous thrombosis. Patient voiced understanding and would like to think about her options. -Continue GDMT-Farxiga 10 mg once daily, metoprolol XL 25 mg once daily, spironolactone 25 mg once daily-and follow-up with primary cardiologist. -Start lasix as needed.  - Refer to advanced heart failure team.   #.  CAD status post 1vCABG (LIMA-LAD): -Continue aspirin 81mg  once daily, clopidogrel 75mg  once daily, atorvastatin 40mg  once daily.  #. Factor V Leiden: #.  Left upper extremity DVT -Patient was referred to hematology but it appears she never had this visit.  She reports taking Eliquis for 2 months. She states her PCP has since discontinued this.    Signed, Nobie Putnam, MD

## 2023-06-14 NOTE — Patient Instructions (Signed)
 Medication Instructions:  Take Lasix 20 mg as needed for swelling   *If you need a refill on your cardiac medications before your next appointment, please call your pharmacy*   Testing/Procedures: Please call us if you would like to proceed with ICD implant.   Follow-Up: At [facility], you and your health needs are our priority.  As part of our continuing mission to provide you with exceptional heart care, we have created designated Provider Care Teams.  These Care Teams include your primary Cardiologist (physician) and Advanced Practice Providers (APPs -  Physician Assistants and Nurse Practitioners) who all work together to provide you with the care you need, when you need it.  We recommend signing up for the patient portal called "MyChart".  Sign up information is provided on this After Visit Summary.  MyChart is used to connect with patients for Virtual Visits (Telemedicine).  Patients are able to view lab/test results, encounter notes, upcoming appointments, etc.  Non-urgent messages can be sent to your provider as well.   To learn more about what you can do with MyChart, go to [email or web address].    Your next appointment:   Call if you would like to scheduled ICD implant.  Provider:   Nobie Putnam, MD    Other Instructions Referral to Heart Failure- they will reach out to get you scheduled.

## 2023-06-15 ENCOUNTER — Telehealth: Payer: Self-pay | Admitting: Family

## 2023-06-15 NOTE — Telephone Encounter (Signed)
Called pt to schedule referral.

## 2023-06-16 ENCOUNTER — Telehealth: Payer: Self-pay

## 2023-06-16 NOTE — Telephone Encounter (Signed)
 Please let pt know that per amanda verbal her labs all look fine

## 2023-06-20 ENCOUNTER — Telehealth: Payer: Self-pay | Admitting: Family

## 2023-06-20 NOTE — Progress Notes (Deleted)
 [facility] Note   Referring Physician: Nobie Putnam, MD PCP: Miki Kins, FNP (last seen 02/25) Cardiologist: Yvonne Kendall, MD / Terrilee Croak, PA (last seen 10/24) EP: Nobie Putnam, MD (last seen 03/25)  Chief Complaint:   HPI:  Ms Budney is a 38 y/o female kindly referred by Dr. Jimmey Ralph. 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  Patient presented to ED in May of 2024 with chest pain. Had LHC which showed hazy eccentric 90% ostial LAD stenosis concerning for acute plaque rupture, tubular 40% proximal RCA stenosis large circumflex without significant disease. EF noted to be 30-35%. She underwent single vessel CABG, LIMA-LAD on 09/02/22.   Echo 02/24/23: EF 30-35%, G1DD, normal RV, moderate MR  Was in the ED 05/25/23 due to GERD.   He presents today for her initial HF visit with a chief complaint of    Review of Systems: [y] = yes, [ ]  = no   General: Weight gain [ ] ; Weight loss [ ] ; Anorexia [ ] ; Fatigue [ ] ; Fever [ ] ; Chills [ ] ; Weakness [ ]   Cardiac: Chest pain/pressure [ ] ; Resting SOB [ ] ; Exertional SOB [ ] ; Orthopnea [ ] ; Pedal Edema [ ] ; Palpitations [ ] ; Syncope [ ] ; Presyncope [ ] ; Paroxysmal nocturnal dyspnea[ ]   Pulmonary: Cough [ ] ; Wheezing[ ] ; Hemoptysis[ ] ; Sputum [ ] ; Snoring [ ]   GI: Vomiting[ ] ; Dysphagia[ ] ; Melena[ ] ; Hematochezia [ ] ; Heartburn[ ] ; Abdominal pain [ ] ; Constipation [ ] ; Diarrhea [ ] ; BRBPR [ ]   GU: Hematuria[ ] ; Dysuria [ ] ; Nocturia[ ]   Vascular: Pain in legs with walking [ ] ; Pain in feet with lying flat [ ] ; Non-healing sores [ ] ; Stroke [ ] ; TIA [ ] ; Slurred speech [ ] ;  Neuro: Headaches[ ] ; Vertigo[ ] ; Seizures[ ] ; Paresthesias[  ];Blurred vision [ ] ; Diplopia [ ] ; Vision changes [ ]   Ortho/Skin: Arthritis [ ] ; Joint pain [ ] ; Muscle pain [ ] ; Joint swelling [ ] ; Back Pain [ ] ; Rash [ ]   Psych: Depression[ ] ; Anxiety[ ]   Heme: Bleeding problems [ ] ; Clotting disorders [ ] ; Anemia [ ]   Endocrine: Diabetes [ ] ; Thyroid dysfunction[ ]    Past Medical History:  Diagnosis Date   Aneurysm (HCC) 01/04/2014   Arthritis    Asthma    WELL CONTROLLED   Brain aneurysm 2007   NEUROLOGY NOTE DOES NOT MENTION ANEURYSM BUT PT STATES SHE DID NOT HAVE TO HAVE SURGERY   Complication of anesthesia    FOR 1 C-SECTION PT WAS ITCHING AND VERY RED ON HER FACE   Cough, persistent 01/27/2016   Dermatitis due to sunburn 11/08/2013   Family history of adverse reaction to anesthesia    brother, neice and nephew got red in face with hives   Gallstones    GERD (gastroesophageal reflux disease)    Gonorrhea 06/20/2020   Headache    CHRONIC HEADACHES   History of chronic cough    DRY   Loss of memory 05/30/2017   Perforation of left tympanic membrane 05/17/2013   Pneumonia    PTSD (post-traumatic stress disorder)    PTSD (post-traumatic stress disorder)    Thyroid condition    PT WAS JUST TOLD ON 08-25-17 THAT SHE HAS  A THYROID PROBLEM AND IS GOING TO F/U WITH ENDOCRINOLOGIST IN 2 WEEKS   Trichomonas vaginalis (TV) infection 06/20/2020    Current Outpatient Medications  Medication Sig Dispense Refill   albuterol (VENTOLIN HFA) 108 (90 Base) MCG/ACT inhaler Inhale 2 puffs into the lungs every 6 (six) hours as needed for wheezing or shortness of breath. 1 each 5   ALPRAZolam (ALPRAZOLAM XR) 0.5 MG 24 hr tablet TAKE 1 TABLET BY MOUTH TWICE A DAY AS NEEDED FOR ANXIETY OR SLEEP 60 tablet 3   aspirin 81 MG chewable tablet Chew 1 tablet (81 mg total) by mouth daily. 90 tablet 0   atorvastatin (LIPITOR) 40 MG tablet Take 1 tablet (40 mg total) by mouth daily. 30 tablet 0   Biotin w/ Vitamins C & E (HAIR SKIN & NAILS GUMMIES PO) Take 1 Units  by mouth daily. (Patient not taking: Reported on 06/14/2023)     cetirizine (ZYRTEC) 10 MG tablet Take 1 tablet (10 mg total) by mouth daily. 90 tablet 1   clopidogrel (PLAVIX) 75 MG tablet Take 1 tablet (75 mg total) by mouth daily. 30 tablet 0   dapagliflozin propanediol (FARXIGA) 10 MG TABS tablet Take 1 tablet (10 mg total) by mouth daily before breakfast. 30 tablet 0   ELIQUIS 5 MG TABS tablet Take 1 tablet (5 mg total) by mouth 2 (two) times daily. (Patient not taking: Reported on 06/14/2023) 180 tablet 1   EPIPEN 2-PAK 0.3 MG/0.3ML SOAJ injection INJECT INTO THIGH MUSCLE THROUGH CLOTHES AS NEEDED SEVERE ALLERGIC REACTION 1 each 1   famotidine (PEPCID) 20 MG tablet Take 1 tablet (20 mg total) by mouth daily. (Patient not taking: Reported on 06/14/2023) 30 tablet 1   fluconazole (DIFLUCAN) 150 MG tablet Take 1 tablet (150 mg total) by mouth daily. (Patient not taking: Reported on 06/14/2023) 5 tablet 0   fluticasone (FLONASE) 50 MCG/ACT nasal spray 1 spray by Each Nare route daily. 16 g 6   furosemide (LASIX) 20 MG tablet Take 1 tablet (20 mg total) by mouth daily as needed. 30 tablet 2   methimazole (TAPAZOLE) 10 MG tablet Take 1 tablet (10 mg total) by mouth 3 (three) times daily. (Patient taking differently: Take 10 mg by mouth daily.) 90 tablet 1   metoprolol succinate (TOPROL XL) 25 MG 24 hr tablet Take 1 tablet (25 mg total) by mouth daily. 30 tablet 1   montelukast (SINGULAIR) 10 MG tablet Take 1 tablet (10 mg total) by mouth daily. (Patient not taking: Reported on 06/14/2023) 90 tablet 1   omeprazole (PRILOSEC) 20 MG capsule Take 20 mg by mouth 2 (two) times daily.     ondansetron (ZOFRAN) 4 MG tablet Take 1 tablet (4 mg total) by mouth every 8 (eight) hours as needed for nausea or vomiting. (Patient not taking: Reported on 06/14/2023) 20 tablet 0   spironolactone (ALDACTONE) 25 MG tablet Take 1 tablet (25 mg total) by mouth daily. 30 tablet 0   sucralfate (CARAFATE) 1 g tablet Take 1 tablet  (1 g total) by mouth 4 (four) times daily. (Patient not taking: Reported on 06/14/2023) 60 tablet 0   Ubrogepant (UBRELVY) 100 MG TABS Take 1 tablet (100 mg total) by mouth as needed. (Patient not taking: Reported on 06/14/2023) 16 tablet 1   No current facility-administered medications for this visit.    Allergies  Allergen Reactions   Azithromycin Swelling and Other (See Comments)   Clindamycin Swelling   Codeine Hives and Other (See Comments)  Nausea/dizzy Nausea/dizzy   Meloxicam Other (See Comments)    Burns up from the inside    Neurontin [Gabapentin] Other (See Comments)    Makes her pass out and not remember what happened prior to taking med.   Penicillins Anaphylaxis    Patient allergic to all "cillins" Has patient had a PCN reaction causing immediate rash, facial/tongue/throat swelling, SOB or lightheadedness with hypotension: Yes Has patient had a PCN reaction causing severe rash involving mucus membranes or skin necrosis: Yes Has patient had a PCN reaction that required hospitalization: No Has patient had a PCN reaction occurring within the last 10 years: Yes If all of the above answers are "NO", then may proceed with Cephalosporin use.    Clindamycin/Lincomycin Swelling   Other Hives and Other (See Comments)    Berry flavored food and drinks   Diphenhydramine Hcl Rash and Other (See Comments)   Doxycycline Rash   Egg-Derived Products Rash      Social History   Socioeconomic History   Marital status: Single    Spouse name: Not on file   Number of children: Not on file   Years of education: Not on file   Highest education level: Not on file  Occupational History   Not on file  Tobacco Use   Smoking status: Every Day    Current packs/day: 0.50    Average packs/day: 0.5 packs/day for 18.0 years (9.0 ttl pk-yrs)    Types: Cigarettes    Passive exposure: Past   Smokeless tobacco: Never  Vaping Use   Vaping status: Never Used  Substance and Sexual Activity    Alcohol use: Yes    Alcohol/week: 0.0 standard drinks of alcohol    Comment: occasionally   Drug use: Not Currently    Types: Marijuana, Cocaine   Sexual activity: Yes    Partners: Male    Birth control/protection: Surgical    Comment: Hysterectomy  Other Topics Concern   Not on file  Social History Narrative   Not on file   Social Drivers of Health   Financial Resource Strain: Not on file  Food Insecurity: No Food Insecurity (12/11/2022)   Hunger Vital Sign    Worried About Running Out of Food in the Last Year: Never true    Ran Out of Food in the Last Year: Never true  Transportation Needs: No Transportation Needs (12/11/2022)   PRAPARE - Administrator, Civil Service (Medical): No    Lack of Transportation (Non-Medical): No  Physical Activity: Not on file  Stress: Not on file  Social Connections: Not on file  Intimate Partner Violence: Not At Risk (12/11/2022)   Humiliation, Afraid, Rape, and Kick questionnaire    Fear of Current or Ex-Partner: No    Emotionally Abused: No    Physically Abused: No    Sexually Abused: No      Family History  Problem Relation Age of Onset   Hypertension Mother    Alcohol abuse Mother    Deep vein thrombosis Mother    Alcohol abuse Father    COPD Father    Emphysema Father    Heart disease Father        PHYSICAL EXAM: General:  Well appearing. No respiratory difficulty HEENT: normal Neck: supple. no JVD. Carotids 2+ bilat; no bruits. No lymphadenopathy or thyromegaly appreciated. Cor: PMI nondisplaced. Regular rate & rhythm. No rubs, gallops or murmurs. Lungs: clear Abdomen: soft, nontender, nondistended. No hepatosplenomegaly. No bruits or masses. Good bowel sounds. Extremities:  no cyanosis, clubbing, rash, edema Neuro: alert & oriented x 3, cranial nerves grossly intact. moves all 4 extremities w/o difficulty. Affect pleasant.  ECG:   ASSESSMENT & PLAN:  1: Ischemic cardiomyopathy with reduced ejection  fraction- - CABG with LIMA-LAD on 09/02/22.  - NYHA class - euvolemic - weighing daily - Echo 02/24/23: EF 30-35%, G1DD, normal RV, moderate MR - saw EP Jimmey Ralph) 03/25 for evaluation of AICD - continue  - BMET 05/25/23 reviewed and showed sodium 137, potassium 4.2, creatinine 0.87 & GFR >60  2: CAD- - saw cardiology Fransico Michael) 10/24 - CABG with LIMA-LAD on 09/02/22.   3: Dyslipidemia- - continue - LDL 04/22/23 was 55  4: Pre-Diabetes- - saw PCP Talbert Forest) 02/25 - A1c 04/22/23 was 5.3%   Delma Freeze, FNP 06/20/23

## 2023-06-20 NOTE — Telephone Encounter (Signed)
 Unable to confirm appt pt vm box full

## 2023-06-21 ENCOUNTER — Telehealth (HOSPITAL_COMMUNITY): Payer: Self-pay | Admitting: Licensed Clinical Social Worker

## 2023-06-21 ENCOUNTER — Encounter: Payer: MEDICAID | Admitting: Family

## 2023-06-21 NOTE — Telephone Encounter (Signed)
 Pt returned call to confirm appt today at 2 pm

## 2023-06-21 NOTE — Telephone Encounter (Signed)
 H&V Care Navigation CSW Progress Note  Clinical Social Worker consulted to help pt cancel transportation for today's appt as she isn't able to come due to being ill (vomiting and diarrhea).  Rescheduled for 4/1 at 2pm- pt informed.  CSW called pt who had set up transportation through [facility]- she confirmed she had their number and that she would call them to cancel the ride.  Patient concerned about her medications almost being out- CSW confirmed that according to chart refills had been sent in early February to [facility] which patient states she has not picked up yet so should at least have 30 day supply remaining on most her medications.  No further needs at this time.   SDOH Screenings   Food Insecurity: No Food Insecurity (12/11/2022)  Housing: Patient Unable To Answer (12/11/2022)  Transportation Needs: No Transportation Needs (12/11/2022)  Utilities: Not At Risk (12/11/2022)  Tobacco Use: High Risk (06/03/2023)   Burna Sis, LCSW Clinical Social Worker [facility] Desk#: [phone] Cell#: [phone]

## 2023-06-23 ENCOUNTER — Telehealth: Payer: Self-pay | Admitting: Family

## 2023-06-23 ENCOUNTER — Other Ambulatory Visit: Payer: Self-pay

## 2023-06-23 MED ORDER — OMEPRAZOLE 20 MG PO CPDR: 20.0000 mg | DELAYED_RELEASE_CAPSULE | Freq: Two times a day (BID) | ORAL | 1 refills | Status: DC

## 2023-06-23 NOTE — Telephone Encounter (Signed)
 Patient left VM wanting [place] to complete an FL2 for her and someone will pick it up tomorrow. Stated she needs this for food stamps.

## 2023-06-24 ENCOUNTER — Other Ambulatory Visit: Payer: Self-pay

## 2023-06-24 NOTE — Telephone Encounter (Signed)
Filled out awaiting signature

## 2023-07-01 ENCOUNTER — Telehealth: Payer: Self-pay | Admitting: Family

## 2023-07-01 ENCOUNTER — Telehealth: Payer: Self-pay | Admitting: Physician Assistant

## 2023-07-01 NOTE — Telephone Encounter (Signed)
 Returned call to patient to follow up- patient reports allergic reaction this morning that led to throat swelling, and shortness of breath. Patient reports that she called EMS and received benadryl. Patient wanted to know if this was okay to take- advised patient that this is an approved OTC HF medication. Patient unsure of what caused reaction. Patient would like to inquire about other heart medications. Advised patient to reach out to primary cardiologist and PCP regarding this and follow up. Also made patient aware of ER precautions should new or worsening symptoms develop. Patient verbalized understanding.

## 2023-07-01 NOTE — Telephone Encounter (Incomplete)
 Called to confirm/remind patient of their appointment at the [facility] on 07/05/23***.   Appointment:   [x] Confirmed  [] Left mess   [] No answer/No voice mail  [] Phone not in service  Patient reminded to bring all medications and/or complete list.  Confirmed patient has transportation. Gave directions, instructed to utilize valet parking.

## 2023-07-01 NOTE — Telephone Encounter (Signed)
   The patient called the answering service after-hours today. She did not feel well all day yesterday and today. She has been having diarrhea and poor oral intake. She felt like she had the flu. She recalls similar symptoms 2 weeks ago when she had the actual flu. She also reports she had recently been out of acid reflux medicine for 5 days. She was wondering if perhaps the warmer weather was contributing. She has been trying to curb her Diet Pepsi intake. She noticed earlier today her voice was crackly/hoarse. She then later had issues with shortness of breath. She then began having issues with feeling her throat closing up. She is unsure what caused this. Also relates a lot of extraneous information to the history including calling [employer], getting her boyfriend a burrito, preference to avoid the hospital. She called EMS and BP was elevated in the 180s. They gave her two gluten free Benadryl. EMS recommended she go to the ER and she refused. She then used her albuterol inhaler. She then felt better. However, this evening she feels as though her throat is crackly and burning. She is already taking 2 other allergy medicines. She has multiple questions including asking why this happened to her today and further plan of care. I told her since we did not evaluate her for this condition we cannot fully provide an explanation to her -. I recommended she get evaluated for this in person as this could represent a life threatening reaction. She adamantly refused to go to the ER but is willing to go to urgent care. I gave her information of the urgent care in [place]. The patient verbalized understanding and gratitude.  She asked that I let Clarisa Kindred know since she is seeing her next week.  Laurann Montana, PA-C

## 2023-07-04 NOTE — Progress Notes (Unsigned)
 [facility] Note   Referring Physician: Nobie Putnam, MD PCP: Miki Kins, FNP (last seen 02/25) Cardiologist: Yvonne Kendall, MD / Terrilee Croak, PA (last seen 10/24) EP: Nobie Putnam, MD (last seen 03/25)  Chief Complaint: shortness of breath  HPI:  Elizabeth Spencer is a 38 y/o female kindly referred by Dr. Jimmey Ralph. 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  Patient presented to ED in May of 2024 with chest pain. Had LHC which showed hazy eccentric 90% ostial LAD stenosis concerning for acute plaque rupture, tubular 40% proximal RCA stenosis large circumflex without significant disease. EF noted to be 30-35%. She underwent single vessel CABG, LIMA-LAD on 09/02/22.   Echo 02/24/23: EF 30-35%, G1DD, normal RV, moderate MR  Was in the ED 05/25/23 due to GERD.   She presents today, with her boyfriend, for her initial HF visit with a chief complaint of moderate shortness of breath with little exertion. Has associated fatigue, chest pain, occasional palpitations, occasional dizziness with sudden weather extremes, pedal edema and anxiety along with this. Denies cough or abdominal distention. Says that last week, she had an episode of where her throat felt like it was closing up, EMS came and gave her benadryl and she's concerned about what caused this. She expresses frustration and concern regarding her anxiolytic and is wanting it to be changed.   Due to continued low EF, she says that there has been discussion regarding AICD implant. This has not been scheduled yet.   Does not weigh daily as she feels like it would make her "extremely anxious" if she started  doing this. She smokes 3 cigarettes daily, 1-2 twisted teas every couple of days, denies drug use.   Review of Systems: [y] = yes, [ ]  = no   General: Weight gain [ ] ; Weight loss [ ] ; Anorexia [ ] ; Fatigue Cove.Etienne ]; Fever [ ] ; Chills [ ] ; Weakness [ ]   Cardiac: Chest pain/pressure Cove.Etienne ]; Resting SOB [ ] ; Exertional SOB Cove.Etienne ]; Orthopnea [ ] ; Pedal Edema Cove.Etienne ]; Palpitations [ y]; Syncope [ ] ; Presyncope [ ] ; Paroxysmal nocturnal dyspnea[ ]   Pulmonary: Cough [ ] ; Wheezing[ ] ; Hemoptysis[ ] ; Sputum [ ] ; Snoring [ ]   GI: Vomiting[ ] ; Dysphagia[ ] ; Melena[ ] ; Hematochezia [ ] ; Heartburn[ ] ; Abdominal pain [ ] ; Constipation [ ] ; Diarrhea [ ] ; BRBPR [ ]   GU: Hematuria[ ] ; Dysuria [ ] ; Nocturia[ ]   Vascular: Pain in legs with walking [ ] ; Pain in feet with lying flat [ ] ; Non-healing sores [ ] ; Stroke [ ] ; TIA [ ] ; Slurred speech [ ] ;  Neuro: Dizziness Cove.Etienne ]; Vertigo[ ] ; Seizures[ ] ; Paresthesias[ ] ;Blurred vision [ ] ; Diplopia [ ] ; Vision changes [ ]   Ortho/Skin: Arthritis [ ] ; Joint pain [ ] ; Muscle pain [ ] ; Joint swelling [ ] ; Back Pain [ ] ; Rash [ ]   Psych: Depression[ ] ; Anxiety[y ]  Heme: Bleeding problems [ ] ; Clotting disorders [ ] ; Anemia [ ]   Endocrine: Diabetes [ ] ; Thyroid dysfunction[y ]   Past Medical History:  Diagnosis Date   Aneurysm (HCC) 01/04/2014   Arthritis    Asthma    WELL CONTROLLED   Brain aneurysm 2007   NEUROLOGY NOTE DOES NOT  MENTION ANEURYSM BUT PT STATES SHE DID NOT HAVE TO HAVE SURGERY   Complication of anesthesia    FOR 1 C-SECTION PT WAS ITCHING AND VERY RED ON HER FACE   Cough, persistent 01/27/2016   Dermatitis due to sunburn 11/08/2013   Family history of adverse reaction to anesthesia    brother, neice and nephew got red in face with hives   Gallstones    GERD (gastroesophageal reflux disease)    Gonorrhea 06/20/2020   Headache    CHRONIC HEADACHES   History of chronic cough    DRY   Loss of memory 05/30/2017   Perforation of left tympanic membrane  05/17/2013   Pneumonia    PTSD (post-traumatic stress disorder)    PTSD (post-traumatic stress disorder)    Thyroid condition    PT WAS JUST TOLD ON 08-25-17 THAT SHE HAS A THYROID PROBLEM AND IS GOING TO F/U WITH ENDOCRINOLOGIST IN 2 WEEKS   Trichomonas vaginalis (TV) infection 06/20/2020    Current Outpatient Medications  Medication Sig Dispense Refill   albuterol (VENTOLIN HFA) 108 (90 Base) MCG/ACT inhaler Inhale 2 puffs into the lungs every 6 (six) hours as needed for wheezing or shortness of breath. 1 each 5   ALPRAZolam (ALPRAZOLAM XR) 0.5 MG 24 hr tablet TAKE 1 TABLET BY MOUTH TWICE A DAY AS NEEDED FOR ANXIETY OR SLEEP 60 tablet 3   aspirin 81 MG chewable tablet Chew 1 tablet (81 mg total) by mouth daily. 90 tablet 0   atorvastatin (LIPITOR) 40 MG tablet Take 1 tablet (40 mg total) by mouth daily. 30 tablet 0   Biotin w/ Vitamins C & E (HAIR SKIN & NAILS GUMMIES PO) Take 1 Units by mouth daily. (Patient not taking: Reported on 06/14/2023)     cetirizine (ZYRTEC) 10 MG tablet Take 1 tablet (10 mg total) by mouth daily. 90 tablet 1   clopidogrel (PLAVIX) 75 MG tablet Take 1 tablet (75 mg total) by mouth daily. 30 tablet 0   dapagliflozin propanediol (FARXIGA) 10 MG TABS tablet Take 1 tablet (10 mg total) by mouth daily before breakfast. 30 tablet 0   EPIPEN 2-PAK 0.3 MG/0.3ML SOAJ injection INJECT INTO THIGH MUSCLE THROUGH CLOTHES AS NEEDED SEVERE ALLERGIC REACTION 1 each 1   fluticasone (FLONASE) 50 MCG/ACT nasal spray 1 spray by Each Nare route daily. 16 g 6   furosemide (LASIX) 20 MG tablet Take 1 tablet (20 mg total) by mouth daily as needed. 30 tablet 2   methimazole (TAPAZOLE) 10 MG tablet Take 1 tablet (10 mg total) by mouth 3 (three) times daily. (Patient taking differently: Take 10 mg by mouth daily.) 90 tablet 1   metoprolol succinate (TOPROL XL) 25 MG 24 hr tablet Take 1 tablet (25 mg total) by mouth daily. 30 tablet 1   omeprazole (PRILOSEC) 20 MG capsule Take 1 capsule (20 mg  total) by mouth 2 (two) times daily. 180 capsule 1   spironolactone (ALDACTONE) 25 MG tablet Take 1 tablet (25 mg total) by mouth daily. 30 tablet 0   No current facility-administered medications for this visit.    Allergies  Allergen Reactions   Azithromycin Swelling and Other (See Comments)   Clindamycin Swelling   Codeine Hives and Other (See Comments)    Nausea/dizzy Nausea/dizzy   Meloxicam Other (See Comments)    Burns up from the inside    Neurontin [Gabapentin] Other (See Comments)    Makes her pass out and not remember what happened prior to taking med.  Penicillins Anaphylaxis    Patient allergic to all "cillins" Has patient had a PCN reaction causing immediate rash, facial/tongue/throat swelling, SOB or lightheadedness with hypotension: Yes Has patient had a PCN reaction causing severe rash involving mucus membranes or skin necrosis: Yes Has patient had a PCN reaction that required hospitalization: No Has patient had a PCN reaction occurring within the last 10 years: Yes If all of the above answers are "NO", then may proceed with Cephalosporin use.    Clindamycin/Lincomycin Swelling   Other Hives and Other (See Comments)    Berry flavored food and drinks   Diphenhydramine Hcl Rash and Other (See Comments)   Doxycycline Rash   Egg-Derived Products Rash      Social History   Socioeconomic History   Marital status: Single    Spouse name: Not on file   Number of children: Not on file   Years of education: Not on file   Highest education level: Not on file  Occupational History   Not on file  Tobacco Use   Smoking status: Every Day    Current packs/day: 0.50    Average packs/day: 0.5 packs/day for 18.0 years (9.0 ttl pk-yrs)    Types: Cigarettes    Passive exposure: Past   Smokeless tobacco: Never  Vaping Use   Vaping status: Never Used  Substance and Sexual Activity   Alcohol use: Yes    Alcohol/week: 0.0 standard drinks of alcohol    Comment:  occasionally   Drug use: Not Currently    Types: Marijuana, Cocaine   Sexual activity: Yes    Partners: Male    Birth control/protection: Surgical    Comment: Hysterectomy  Other Topics Concern   Not on file  Social History Narrative   Not on file   Social Drivers of Health   Financial Resource Strain: Not on file  Food Insecurity: No Food Insecurity (12/11/2022)   Hunger Vital Sign    Worried About Running Out of Food in the Last Year: Never true    Ran Out of Food in the Last Year: Never true  Transportation Needs: No Transportation Needs (12/11/2022)   PRAPARE - Administrator, Civil Service (Medical): No    Lack of Transportation (Non-Medical): No  Physical Activity: Not on file  Stress: Not on file  Social Connections: Not on file  Intimate Partner Violence: Not At Risk (12/11/2022)   Humiliation, Afraid, Rape, and Kick questionnaire    Fear of Current or Ex-Partner: No    Emotionally Abused: No    Physically Abused: No    Sexually Abused: No      Family History  Problem Relation Age of Onset   Hypertension Mother    Alcohol abuse Mother    Deep vein thrombosis Mother    Alcohol abuse Father    COPD Father    Emphysema Father    Heart disease Father    Vitals:   07/05/23 1440  BP: (!) 129/97  Pulse: 99  SpO2: 97%  Weight: 253 lb 6.4 oz (114.9 kg)   Wt Readings from Last 3 Encounters:  07/05/23 253 lb 6.4 oz (114.9 kg)  06/14/23 250 lb 3.2 oz (113.5 kg)  06/03/23 248 lb 9.6 oz (112.8 kg)   Lab Results  Component Value Date   CREATININE 0.87 05/25/2023   CREATININE 0.66 04/22/2023   CREATININE 0.45 12/13/2022    PHYSICAL EXAM:  General: Well appearing. No resp difficulty HEENT: normal Neck: supple, no JVD Cor: Regular rhythm,  rate. No rubs, gallops or murmurs Lungs: clear Abdomen: soft, nontender, nondistended. Extremities: no cyanosis, clubbing, rash, trace pitting edema bilateral lower legs Neuro: alert & oriented X 3. Moves all 4  extremities w/o difficulty. Affect pleasant, anxious   ECG: not done   ASSESSMENT & PLAN:  1: Ischemic cardiomyopathy with reduced ejection fraction- - CABG with LIMA-LAD on 09/02/22.  - NYHA class III - euvolemic - not weighing daily and says that if she starts, it will make her anxiety worse - Echo 02/24/23: EF 30-35%, G1DD, normal RV, moderate MR - saw EP Jimmey Ralph) 03/25 for evaluation of AICD; implant has not been scheduled yet - continue farxiga 10mg  daily - continue furosemide 20mg  daily PRN; reviewed parameters of when to take this (increased swelling/ SOB) - continue metoprolol succinate 25mg  daily - begin entresto 24/26mg  BID - BMET later this week (confirmed with PCP) - continue spironolactone 25mg  daily - needs sleep study but patient says that she will only do a in home sleep study and not at the sleep lab; P. Branch will check about her insurance to do [place] sleep study - BMET 05/25/23 reviewed and showed sodium 137, potassium 4.2, creatinine 0.87 & GFR >60  2: CAD- - saw cardiology Fransico Michael) 10/24 - CABG with LIMA-LAD on 09/02/22.  - continue clopidogrel 75mg  daily - continue ASA 81mg  daily  3: Dyslipidemia- - continue atorvastatin 40mg  daily - LDL 04/22/23 was 55  4: Pre-Diabetes- - saw PCP Talbert Forest) 02/25 - A1c 04/22/23 was 5.3%  5: Anxiety- - currently taking alprazolam XR 0.5mg  BID PRN - continue taking hydroxyzine 25mg  TID PRN - emphasized that she f/u with her PCP regarding this as we will not manage this   Return in 2 weeks, sooner if needed.   Delma Freeze, FNP 07/04/23

## 2023-07-05 ENCOUNTER — Ambulatory Visit: Payer: MEDICAID | Attending: Family | Admitting: Family

## 2023-07-05 ENCOUNTER — Encounter: Payer: Self-pay | Admitting: Family

## 2023-07-05 ENCOUNTER — Telehealth: Payer: Self-pay

## 2023-07-05 ENCOUNTER — Other Ambulatory Visit (HOSPITAL_COMMUNITY): Payer: Self-pay

## 2023-07-05 VITALS — BP 129/97 | HR 99 | Wt 253.4 lb

## 2023-07-05 DIAGNOSIS — R0602 Shortness of breath: Secondary | ICD-10-CM | POA: Diagnosis present

## 2023-07-05 DIAGNOSIS — K219 Gastro-esophageal reflux disease without esophagitis: Secondary | ICD-10-CM | POA: Insufficient documentation

## 2023-07-05 DIAGNOSIS — E785 Hyperlipidemia, unspecified: Secondary | ICD-10-CM | POA: Diagnosis not present

## 2023-07-05 DIAGNOSIS — Z7984 Long term (current) use of oral hypoglycemic drugs: Secondary | ICD-10-CM | POA: Diagnosis not present

## 2023-07-05 DIAGNOSIS — G8929 Other chronic pain: Secondary | ICD-10-CM | POA: Insufficient documentation

## 2023-07-05 DIAGNOSIS — F17210 Nicotine dependence, cigarettes, uncomplicated: Secondary | ICD-10-CM | POA: Insufficient documentation

## 2023-07-05 DIAGNOSIS — R002 Palpitations: Secondary | ICD-10-CM | POA: Insufficient documentation

## 2023-07-05 DIAGNOSIS — Z7902 Long term (current) use of antithrombotics/antiplatelets: Secondary | ICD-10-CM | POA: Diagnosis not present

## 2023-07-05 DIAGNOSIS — R42 Dizziness and giddiness: Secondary | ICD-10-CM | POA: Diagnosis not present

## 2023-07-05 DIAGNOSIS — I5022 Chronic systolic (congestive) heart failure: Secondary | ICD-10-CM

## 2023-07-05 DIAGNOSIS — R5383 Other fatigue: Secondary | ICD-10-CM | POA: Insufficient documentation

## 2023-07-05 DIAGNOSIS — R600 Localized edema: Secondary | ICD-10-CM | POA: Insufficient documentation

## 2023-07-05 DIAGNOSIS — E782 Mixed hyperlipidemia: Secondary | ICD-10-CM | POA: Insufficient documentation

## 2023-07-05 DIAGNOSIS — I255 Ischemic cardiomyopathy: Secondary | ICD-10-CM | POA: Diagnosis not present

## 2023-07-05 DIAGNOSIS — Z951 Presence of aortocoronary bypass graft: Secondary | ICD-10-CM | POA: Diagnosis not present

## 2023-07-05 DIAGNOSIS — F419 Anxiety disorder, unspecified: Secondary | ICD-10-CM

## 2023-07-05 DIAGNOSIS — Z7982 Long term (current) use of aspirin: Secondary | ICD-10-CM | POA: Diagnosis not present

## 2023-07-05 DIAGNOSIS — R079 Chest pain, unspecified: Secondary | ICD-10-CM | POA: Insufficient documentation

## 2023-07-05 DIAGNOSIS — Z95810 Presence of automatic (implantable) cardiac defibrillator: Secondary | ICD-10-CM | POA: Insufficient documentation

## 2023-07-05 DIAGNOSIS — Z79899 Other long term (current) drug therapy: Secondary | ICD-10-CM | POA: Insufficient documentation

## 2023-07-05 DIAGNOSIS — I2510 Atherosclerotic heart disease of native coronary artery without angina pectoris: Secondary | ICD-10-CM | POA: Diagnosis not present

## 2023-07-05 DIAGNOSIS — R7303 Prediabetes: Secondary | ICD-10-CM

## 2023-07-05 MED ORDER — ENTRESTO 24-26 MG PO TABS: 1.0000 | ORAL_TABLET | Freq: Two times a day (BID) | ORAL | 11 refills | Status: DC

## 2023-07-05 NOTE — Patient Instructions (Signed)
 No Labs done today.   START Entresto 24-26mg  (1 tablet) by mouth 2 times daily.   No other medication changes were made. Please continue all current medications as prescribed.  Your physician recommends that you schedule a follow-up appointment in: 2 weeks  If you have any questions or concerns before your next appointment please send [place] a message through [place] or call our office at [phone].    TO LEAVE A MESSAGE FOR THE NURSE SELECT OPTION 2, PLEASE LEAVE A MESSAGE INCLUDING: YOUR NAME DATE OF BIRTH CALL BACK NUMBER REASON FOR CALL**this is important as we prioritize the call backs  YOU WILL RECEIVE A CALL BACK THE SAME DAY AS LONG AS YOU CALL BEFORE 4:00 PM   Do the following things EVERYDAY: Weigh yourself in the morning before breakfast. Write it down and keep it in a log. Take your medicines as prescribed Eat low salt foods--Limit salt (sodium) to 2000 mg per day.  Stay as active as you can everyday Limit all fluids for the day to less than 2 liters   At the [facility], you and your health needs are our priority. As part of our continuing mission to provide you with exceptional heart care, we have created designated Provider Care Teams. These Care Teams include your primary Cardiologist (physician) and Advanced Practice Providers (APPs- Physician Assistants and Nurse Practitioners) who all work together to provide you with the care you need, when you need it.   You may see any of the following providers on your designated Care Team at your next follow up: Dr Arvilla Meres Dr Marca Ancona Dr. Marcos Eke, NP Robbie Lis, [place] [facility] [place], [place] Brynda Peon, NP Karle Plumber, PharmD   Please be sure to bring in all your medications bottles to every appointment.    Thank you for choosing [facility]

## 2023-07-05 NOTE — Telephone Encounter (Signed)
 Patient Advocate Encounter  Test billing for [facility] requires DUR overrides, with a copay of $4 for 90 days.   Burnell Blanks, CPhT Rx Patient Advocate Phone: [phone]

## 2023-07-08 ENCOUNTER — Other Ambulatory Visit: Payer: Self-pay

## 2023-07-08 ENCOUNTER — Other Ambulatory Visit: Payer: Self-pay | Admitting: Family

## 2023-07-08 ENCOUNTER — Telehealth: Payer: Self-pay | Admitting: Internal Medicine

## 2023-07-08 DIAGNOSIS — I5022 Chronic systolic (congestive) heart failure: Secondary | ICD-10-CM

## 2023-07-08 NOTE — Telephone Encounter (Signed)
 Patient is calling to speak to the nurse about getting some medication. She states that her face is swollen from a tooth that needs to be taking out but unable to because they dont want to put her to sleep. Please advise

## 2023-07-08 NOTE — Telephone Encounter (Signed)
 Spoke with the patient and advised that she will need lab work done prior to her procedure. She will also need to come by the office to pick up scrub. I have reviewed instructions for her procedure with her and also mailed her a copy.

## 2023-07-08 NOTE — Telephone Encounter (Signed)
 Patient called to report she been experiencing a tooth infection and swelling. She stated that she currently does not have a dentist and inquired about the possibility of being prescribed an antibiotic. The nurse recommended that the patient contact her primary care physician's office.  The patient also mentioned receiving a phone call today from our office. However, no documentation was found regarding the call. Pre-procedure labs were ordered. The nurse will forward the message to Dr. Lavone Neri nurse.

## 2023-07-08 NOTE — Telephone Encounter (Signed)
 Patient called in c/o mouth swollen from infected tooth. Wants antibiotics and pain meds. Please advise.

## 2023-07-13 ENCOUNTER — Other Ambulatory Visit: Payer: Self-pay | Admitting: Family

## 2023-07-13 ENCOUNTER — Telehealth (HOSPITAL_COMMUNITY): Payer: Self-pay

## 2023-07-13 NOTE — Telephone Encounter (Signed)
 Error. Duplicate encounter.

## 2023-07-13 NOTE — Telephone Encounter (Addendum)
 Spoke with patient to complete pre-procedure call.     New medical conditions? Pt reports she is to start an oral Abx for an infected tooth and was previously recommended for an extraction from dentist from prior infection but needed cardiac clearance prior due to cardiac history. Will make Dr. Jimmey Ralph aware.  Pre-procedure testing scheduled: lab work ordered  Confirmed patient is scheduled for  Subcutaneous ICD  on Monday, May 5 with Dr. Michele Rockers. Instructed patient to arrive at the Main Entrance A at [facility]: [address] [place], [place] [address] and check in at Admitting at 10:30 AM  Advised of plan to go home the same day and will only stay overnight if medically necessary. You MUST have a responsible adult to drive you home and MUST be with you the first 24 hours after you arrive home or your procedure could be cancelled.  Patient verbalized understanding to information provided and is agreeable to proceed with procedure.

## 2023-07-13 NOTE — Telephone Encounter (Signed)
 Patient called in again about her tooth being infected. Her cardiologist told her to contact her PCP. Has taken aspirin and Tylenol with no relief. Took a leftover antibiotic she had (metronidazole) and it helped some. She only had 3 days worth and has taken all of it. Can we send an antibiotic in please?

## 2023-07-14 MED ORDER — METRONIDAZOLE 500 MG PO TABS: 500.0000 mg | ORAL_TABLET | Freq: Two times a day (BID) | ORAL | 0 refills | Status: DC

## 2023-07-18 ENCOUNTER — Ambulatory Visit: Payer: MEDICAID | Admitting: Family

## 2023-07-18 ENCOUNTER — Telehealth: Payer: Self-pay | Admitting: Family

## 2023-07-18 ENCOUNTER — Encounter: Payer: Self-pay | Admitting: Family

## 2023-07-18 VITALS — BP 118/68 | HR 105 | Ht 67.0 in | Wt 250.0 lb

## 2023-07-18 DIAGNOSIS — G4733 Obstructive sleep apnea (adult) (pediatric): Secondary | ICD-10-CM | POA: Diagnosis not present

## 2023-07-18 DIAGNOSIS — G43009 Migraine without aura, not intractable, without status migrainosus: Secondary | ICD-10-CM

## 2023-07-18 DIAGNOSIS — F3173 Bipolar disorder, in partial remission, most recent episode manic: Secondary | ICD-10-CM

## 2023-07-18 DIAGNOSIS — Z0130 Encounter for examination of blood pressure without abnormal findings: Secondary | ICD-10-CM

## 2023-07-18 DIAGNOSIS — E0590 Thyrotoxicosis, unspecified without thyrotoxic crisis or storm: Secondary | ICD-10-CM

## 2023-07-18 MED ORDER — ONDANSETRON 4 MG PO TBDP: 4.0000 mg | ORAL_TABLET | Freq: Three times a day (TID) | ORAL | 0 refills | Status: DC | PRN

## 2023-07-18 MED ORDER — HYDROCODONE-ACETAMINOPHEN 5-325 MG PO TABS: 1.0000 | ORAL_TABLET | Freq: Four times a day (QID) | ORAL | 0 refills | Status: AC | PRN

## 2023-07-18 MED ORDER — BENZONATATE 100 MG PO CAPS: 100.0000 mg | ORAL_CAPSULE | Freq: Three times a day (TID) | ORAL | 1 refills | Status: DC | PRN

## 2023-07-18 MED ORDER — CIPROFLOXACIN HCL 500 MG PO TABS: 500.0000 mg | ORAL_TABLET | Freq: Two times a day (BID) | ORAL | 0 refills | Status: AC

## 2023-07-18 NOTE — Telephone Encounter (Incomplete)
 Called to confirm/remind patient of their appointment at the [facility] on 07/19/23.   Appointment:   [x] Confirmed  [] Left mess   [] No answer/No voice mail  [] Phone not in service  Patient reminded to bring all medications and/or complete list.  Confirmed patient has transportation. Gave directions, instructed to utilize valet parking.

## 2023-07-18 NOTE — Progress Notes (Unsigned)
 [facility] Note   Referring Physician: [name], MD PCP: Trenda Frisk, FNP (last seen 02/25) Cardiologist: Sammy Crisp, MD / Toribio Frees, PA (last seen 10/24) EP: [name], MD (last seen 03/25)  Chief Complaint: shortness of breath  HPI:  Ms Deihl is a 38 y/o female kindly referred by Dr. Daneil Dunker. 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  Patient presented to ED in May of 2024 with chest pain. Had LHC which showed hazy eccentric 90% ostial LAD stenosis concerning for acute plaque rupture, tubular 40% proximal RCA stenosis large circumflex without significant disease. EF noted to be 30-35%. She underwent single vessel CABG, LIMA-LAD on 09/02/22.   Echo 02/24/23: EF 30-35%, G1DD, normal RV, moderate MR  Was in the ED 05/25/23 due to GERD.   She presents today, with her boyfriend, for her initial HF visit with a chief complaint of moderate shortness of breath with little exertion. Has associated fatigue, chest pain, occasional palpitations, occasional dizziness with sudden weather extremes, pedal edema and anxiety along with this. Denies cough or abdominal distention. Says that last week, she had an episode of where her throat felt like it was closing up, EMS came and gave her benadryl and she's concerned about what caused this. She expresses frustration and concern regarding her anxiolytic and is wanting it to be changed.   Due to continued low EF, she says that there has been discussion regarding AICD implant. This has not been scheduled yet.   Does not weigh daily as she feels like it would make her "extremely anxious" if she started  doing this. She smokes 3 cigarettes daily, 1-2 twisted teas every couple of days, denies drug use.   Review of Systems: [y] = yes, [ ]  = no   General: Weight gain [ ] ; Weight loss [ ] ; Anorexia [ ] ; Fatigue Davis.Dad ]; Fever [ ] ; Chills [ ] ; Weakness [ ]   Cardiac: Chest pain/pressure Davis.Dad ]; Resting SOB [ ] ; Exertional SOB Davis.Dad ]; Orthopnea [ ] ; Pedal Edema Davis.Dad ]; Palpitations [ y]; Syncope [ ] ; Presyncope [ ] ; Paroxysmal nocturnal dyspnea[ ]   Pulmonary: Cough [ ] ; Wheezing[ ] ; Hemoptysis[ ] ; Sputum [ ] ; Snoring [ ]   GI: Vomiting[ ] ; Dysphagia[ ] ; Melena[ ] ; Hematochezia [ ] ; Heartburn[ ] ; Abdominal pain [ ] ; Constipation [ ] ; Diarrhea [ ] ; BRBPR [ ]   GU: Hematuria[ ] ; Dysuria [ ] ; Nocturia[ ]   Vascular: Pain in legs with walking [ ] ; Pain in feet with lying flat [ ] ; Non-healing sores [ ] ; Stroke [ ] ; TIA [ ] ; Slurred speech [ ] ;  Neuro: Dizziness Davis.Dad ]; Vertigo[ ] ; Seizures[ ] ; Paresthesias[ ] ;Blurred vision [ ] ; Diplopia [ ] ; Vision changes [ ]   Ortho/Skin: Arthritis [ ] ; Joint pain [ ] ; Muscle pain [ ] ; Joint swelling [ ] ; Back Pain [ ] ; Rash [ ]   Psych: Depression[ ] ; Anxiety[y ]  Heme: Bleeding problems [ ] ; Clotting disorders [ ] ; Anemia [ ]   Endocrine: Diabetes [ ] ; Thyroid dysfunction[y ]   Past Medical History:  Diagnosis Date   Aneurysm (HCC) 01/04/2014   Arthritis    Asthma    WELL CONTROLLED   Brain aneurysm 2007   NEUROLOGY NOTE DOES NOT  MENTION ANEURYSM BUT PT STATES SHE DID NOT HAVE TO HAVE SURGERY   Complication of anesthesia    FOR 1 C-SECTION PT WAS ITCHING AND VERY RED ON HER FACE   Cough, persistent 01/27/2016   Dermatitis due to sunburn 11/08/2013   Family history of adverse reaction to anesthesia    brother, neice and nephew got red in face with hives   Gallstones    GERD (gastroesophageal reflux disease)    Gonorrhea 06/20/2020   Headache    CHRONIC HEADACHES   History of chronic cough    DRY   Loss of memory 05/30/2017   Perforation of left tympanic membrane  05/17/2013   Pneumonia    PTSD (post-traumatic stress disorder)    PTSD (post-traumatic stress disorder)    Thyroid condition    PT WAS JUST TOLD ON 08-25-17 THAT SHE HAS A THYROID PROBLEM AND IS GOING TO F/U WITH ENDOCRINOLOGIST IN 2 WEEKS   Trichomonas vaginalis (TV) infection 06/20/2020    Current Outpatient Medications  Medication Sig Dispense Refill   ALPRAZolam (ALPRAZOLAM XR) 0.5 MG 24 hr tablet TAKE 1 TABLET BY MOUTH TWICE DAILY AS NEEDED FOR ANXIETY OR SLEEP 60 tablet 2   aspirin 81 MG chewable tablet Chew 1 tablet (81 mg total) by mouth daily. 90 tablet 0   atorvastatin (LIPITOR) 40 MG tablet Take 1 tablet (40 mg total) by mouth daily. 30 tablet 0   benzonatate (TESSALON PERLES) 100 MG capsule Take 1 capsule (100 mg total) by mouth 3 (three) times daily as needed for cough. 30 capsule 1   Biotin w/ Vitamins C & E (HAIR SKIN & NAILS GUMMIES PO) Take 1 Units by mouth daily.     cetirizine (ZYRTEC) 10 MG tablet Take 1 tablet (10 mg total) by mouth daily. (Patient taking differently: Take 10 mg by mouth 2 (two) times daily.) 90 tablet 1   ciprofloxacin (CIPRO) 500 MG tablet Take 1 tablet (500 mg total) by mouth 2 (two) times daily for 7 days. 14 tablet 0   clopidogrel (PLAVIX) 75 MG tablet Take 1 tablet (75 mg total) by mouth daily. 30 tablet 0   dapagliflozin propanediol (FARXIGA) 10 MG TABS tablet Take 1 tablet (10 mg total) by mouth daily before breakfast. 30 tablet 0   EPIPEN 2-PAK 0.3 MG/0.3ML SOAJ injection INJECT INTO THIGH MUSCLE THROUGH CLOTHES AS NEEDED SEVERE ALLERGIC REACTION 1 each 1   fluticasone (FLONASE) 50 MCG/ACT nasal spray 1 spray by Each Nare route daily. 16 g 6   furosemide (LASIX) 20 MG tablet Take 1 tablet (20 mg total) by mouth daily as needed. 30 tablet 2   HYDROcodone-acetaminophen (NORCO/VICODIN) 5-325 MG tablet Take 1 tablet by mouth every 6 (six) hours as needed for up to 5 days for moderate pain (pain score 4-6). 20 tablet 0   hydrOXYzine (ATARAX) 25 MG  tablet Take 25 mg by mouth 3 (three) times daily as needed.     methimazole (TAPAZOLE) 10 MG tablet Take 1 tablet (10 mg total) by mouth daily. 90 tablet 1   metoprolol succinate (TOPROL XL) 25 MG 24 hr tablet Take 1 tablet (25 mg total) by mouth daily. 30 tablet 1   metroNIDAZOLE (FLAGYL) 500 MG tablet Take 1 tablet (500 mg total) by mouth 2 (two) times daily for 10 days. 20 tablet 0   montelukast (SINGULAIR) 10 MG tablet Take 10 mg by mouth at bedtime. (Patient not taking: Reported on 07/18/2023)     omeprazole (PRILOSEC) 20 MG capsule  Take 1 capsule (20 mg total) by mouth 2 (two) times daily. 180 capsule 1   ondansetron (ZOFRAN-ODT) 4 MG disintegrating tablet Take 1 tablet (4 mg total) by mouth every 8 (eight) hours as needed for nausea or vomiting. 20 tablet 0   sacubitril-valsartan (ENTRESTO) 24-26 MG Take 1 tablet by mouth 2 (two) times daily. 60 tablet 11   spironolactone (ALDACTONE) 25 MG tablet Take 1 tablet (25 mg total) by mouth daily. 30 tablet 0   No current facility-administered medications for this visit.    Allergies  Allergen Reactions   Azithromycin Swelling and Other (See Comments)   Clindamycin Swelling   Codeine Hives and Other (See Comments)    Nausea/dizzy Nausea/dizzy   Meloxicam Other (See Comments)    Burns up from the inside    Neurontin [Gabapentin] Other (See Comments)    Makes her pass out and not remember what happened prior to taking med.   Penicillins Anaphylaxis    Patient allergic to all "cillins" Has patient had a PCN reaction causing immediate rash, facial/tongue/throat swelling, SOB or lightheadedness with hypotension: Yes Has patient had a PCN reaction causing severe rash involving mucus membranes or skin necrosis: Yes Has patient had a PCN reaction that required hospitalization: No Has patient had a PCN reaction occurring within the last 10 years: Yes If all of the above answers are "NO", then may proceed with Cephalosporin use.     Clindamycin/Lincomycin Swelling   Other Hives and Other (See Comments)    Berry flavored food and drinks   Diphenhydramine Hcl Rash and Other (See Comments)   Doxycycline Rash   Egg-Derived Products Rash      Social History   Socioeconomic History   Marital status: Single    Spouse name: Not on file   Number of children: Not on file   Years of education: Not on file   Highest education level: Not on file  Occupational History   Not on file  Tobacco Use   Smoking status: Every Day    Current packs/day: 0.50    Average packs/day: 0.5 packs/day for 18.0 years (9.0 ttl pk-yrs)    Types: Cigarettes    Passive exposure: Past   Smokeless tobacco: Never  Vaping Use   Vaping status: Never Used  Substance and Sexual Activity   Alcohol use: Yes    Alcohol/week: 0.0 standard drinks of alcohol    Comment: occasionally   Drug use: Not Currently    Types: Marijuana, Cocaine   Sexual activity: Yes    Partners: Male    Birth control/protection: Surgical    Comment: Hysterectomy  Other Topics Concern   Not on file  Social History Narrative   Not on file   Social Drivers of Health   Financial Resource Strain: Not on file  Food Insecurity: No Food Insecurity (12/11/2022)   Hunger Vital Sign    Worried About Running Out of Food in the Last Year: Never true    Ran Out of Food in the Last Year: Never true  Transportation Needs: No Transportation Needs (12/11/2022)   PRAPARE - Administrator, Civil Service (Medical): No    Lack of Transportation (Non-Medical): No  Physical Activity: Not on file  Stress: Not on file  Social Connections: Not on file  Intimate Partner Violence: Not At Risk (12/11/2022)   Humiliation, Afraid, Rape, and Kick questionnaire    Fear of Current or Ex-Partner: No    Emotionally Abused: No    Physically  Abused: No    Sexually Abused: No      Family History  Problem Relation Age of Onset   Hypertension Mother    Alcohol abuse Mother    Deep  vein thrombosis Mother    Alcohol abuse Father    COPD Father    Emphysema Father    Heart disease Father    There were no vitals filed for this visit.  Wt Readings from Last 3 Encounters:  07/18/23 250 lb (113.4 kg)  07/05/23 253 lb 6.4 oz (114.9 kg)  06/14/23 250 lb 3.2 oz (113.5 kg)   Lab Results  Component Value Date   CREATININE 0.87 05/25/2023   CREATININE 0.66 04/22/2023   CREATININE 0.45 12/13/2022    PHYSICAL EXAM:  General: Well appearing. No resp difficulty HEENT: normal Neck: supple, no JVD Cor: Regular rhythm, rate. No rubs, gallops or murmurs Lungs: clear Abdomen: soft, nontender, nondistended. Extremities: no cyanosis, clubbing, rash, trace pitting edema bilateral lower legs Neuro: alert & oriented X 3. Moves all 4 extremities w/o difficulty. Affect pleasant, anxious   ECG: not done   ASSESSMENT & PLAN:  1: Ischemic cardiomyopathy with reduced ejection fraction- - CABG with LIMA-LAD on 09/02/22.  - NYHA class III - euvolemic - not weighing daily and says that if she starts, it will make her anxiety worse - Echo 02/24/23: EF 30-35%, G1DD, normal RV, moderate MR - saw EP Daneil Dunker) 03/25 for evaluation of AICD; implant has not been scheduled yet - continue farxiga 10mg  daily - continue furosemide 20mg  daily PRN; reviewed parameters of when to take this (increased swelling/ SOB) - continue metoprolol succinate 25mg  daily - begin entresto 24/26mg  BID - BMET later this week (confirmed with PCP) - continue spironolactone 25mg  daily - needs sleep study but patient says that she will only do a in home sleep study and not at the sleep lab; P. Branch will check about her insurance to do Itamar sleep study - BMET 05/25/23 reviewed and showed sodium 137, potassium 4.2, creatinine 0.87 & GFR >60  2: CAD- - saw cardiology Gennaro Khat) 10/24 - CABG with LIMA-LAD on 09/02/22.  - continue clopidogrel 75mg  daily - continue ASA 81mg  daily  3: Dyslipidemia- - continue  atorvastatin 40mg  daily - LDL 04/22/23 was 55  4: Pre-Diabetes- - saw PCP Monroe Antigua) 02/25 - A1c 04/22/23 was 5.3%  5: Anxiety- - currently taking alprazolam XR 0.5mg  BID PRN - continue taking hydroxyzine 25mg  TID PRN - emphasized that she f/u with her PCP regarding this as we will not manage this   Return in 2 weeks, sooner if needed.   Charlette Console, FNP 07/18/23

## 2023-07-19 ENCOUNTER — Ambulatory Visit: Payer: MEDICAID | Attending: Family | Admitting: Family

## 2023-07-19 ENCOUNTER — Encounter: Payer: Self-pay | Admitting: Family

## 2023-07-19 VITALS — BP 96/50 | HR 109 | Wt 251.0 lb

## 2023-07-19 DIAGNOSIS — Z79899 Other long term (current) drug therapy: Secondary | ICD-10-CM | POA: Diagnosis not present

## 2023-07-19 DIAGNOSIS — F17210 Nicotine dependence, cigarettes, uncomplicated: Secondary | ICD-10-CM | POA: Diagnosis not present

## 2023-07-19 DIAGNOSIS — Z951 Presence of aortocoronary bypass graft: Secondary | ICD-10-CM | POA: Insufficient documentation

## 2023-07-19 DIAGNOSIS — I25118 Atherosclerotic heart disease of native coronary artery with other forms of angina pectoris: Secondary | ICD-10-CM | POA: Diagnosis not present

## 2023-07-19 DIAGNOSIS — M545 Low back pain, unspecified: Secondary | ICD-10-CM | POA: Diagnosis not present

## 2023-07-19 DIAGNOSIS — I5022 Chronic systolic (congestive) heart failure: Secondary | ICD-10-CM | POA: Diagnosis not present

## 2023-07-19 DIAGNOSIS — K219 Gastro-esophageal reflux disease without esophagitis: Secondary | ICD-10-CM | POA: Insufficient documentation

## 2023-07-19 DIAGNOSIS — G8929 Other chronic pain: Secondary | ICD-10-CM | POA: Diagnosis not present

## 2023-07-19 DIAGNOSIS — E079 Disorder of thyroid, unspecified: Secondary | ICD-10-CM | POA: Insufficient documentation

## 2023-07-19 DIAGNOSIS — I255 Ischemic cardiomyopathy: Secondary | ICD-10-CM | POA: Diagnosis not present

## 2023-07-19 DIAGNOSIS — F419 Anxiety disorder, unspecified: Secondary | ICD-10-CM | POA: Diagnosis not present

## 2023-07-19 DIAGNOSIS — E559 Vitamin D deficiency, unspecified: Secondary | ICD-10-CM | POA: Insufficient documentation

## 2023-07-19 DIAGNOSIS — G4730 Sleep apnea, unspecified: Secondary | ICD-10-CM | POA: Insufficient documentation

## 2023-07-19 DIAGNOSIS — G4700 Insomnia, unspecified: Secondary | ICD-10-CM | POA: Diagnosis not present

## 2023-07-19 DIAGNOSIS — E519 Thiamine deficiency, unspecified: Secondary | ICD-10-CM | POA: Diagnosis not present

## 2023-07-19 DIAGNOSIS — Z7982 Long term (current) use of aspirin: Secondary | ICD-10-CM | POA: Insufficient documentation

## 2023-07-19 DIAGNOSIS — F3189 Other bipolar disorder: Secondary | ICD-10-CM | POA: Insufficient documentation

## 2023-07-19 DIAGNOSIS — Z7902 Long term (current) use of antithrombotics/antiplatelets: Secondary | ICD-10-CM | POA: Diagnosis not present

## 2023-07-19 DIAGNOSIS — I2510 Atherosclerotic heart disease of native coronary artery without angina pectoris: Secondary | ICD-10-CM | POA: Insufficient documentation

## 2023-07-19 DIAGNOSIS — R7303 Prediabetes: Secondary | ICD-10-CM | POA: Insufficient documentation

## 2023-07-19 DIAGNOSIS — Q860 Fetal alcohol syndrome (dysmorphic): Secondary | ICD-10-CM | POA: Diagnosis not present

## 2023-07-19 DIAGNOSIS — E782 Mixed hyperlipidemia: Secondary | ICD-10-CM | POA: Insufficient documentation

## 2023-07-19 DIAGNOSIS — E785 Hyperlipidemia, unspecified: Secondary | ICD-10-CM

## 2023-07-19 DIAGNOSIS — R0602 Shortness of breath: Secondary | ICD-10-CM | POA: Diagnosis present

## 2023-07-19 MED ORDER — LOSARTAN POTASSIUM 25 MG PO TABS: 12.5000 mg | ORAL_TABLET | Freq: Every day | ORAL | 3 refills | Status: DC

## 2023-07-19 NOTE — Progress Notes (Signed)
 ITAMAR home sleep study given to patient, all instructions explained, waiver signed, and CLOUDPAT registration complete.

## 2023-07-19 NOTE — Progress Notes (Signed)
 [facility] - HEART FAILURE CLINIC - PHARMACIST COUNSELING NOTE  Adherence Assessment  Do you ever forget to take your medication? [] Yes [] No  Do you ever skip doses due to side effects? [] Yes [] No  Do you have trouble affording your medicines? [] Yes [] No  Are you ever unable to pick up your medication due to transportation difficulties? [] Yes [] No  Do you ever stop taking your medications because you don't believe they are helping? [] Yes [] No  Do you check your weight daily? [] Yes [] No  Do you check your blood pressure daily? [] Yes [] No  Adherence strategy:   Barriers to obtaining medications:     Vital signs: HR ***, BP ***, weight (pounds) *** ECHO: Date ***, EF ***, notes *** Cath: Date ***, EF ***, notes *** Renal function: Date ***, GFR ***  Current Guideline-Directed Medical Therapy/Evidence Based Medicine  ACE/ARB/ARNI: {AR_ARNI/ACEi/ARB:25599} Target dose:  Beta Blocker: {AR_Beta blocker:25598} Target dose:  Aldosterone Antagonist: {AR_Mineralocorticoid receptor antagonists:25602} Target dose:  SGLT2i: {AR_SGLT2i:25603} Target dose:   Diuretic: {AR_Diuretics:25604}  Others:   ASSESSMENT *** year old {Gender Description:210950033} who presents to the HF clinic ***. PMH is significant for ***.   Recent ED visit or hospitalization (past 6 months): Date - ***, CC - *** , Admission Dx (if applicable) - ***    COUNSELING POINTS/CLINICAL PEARLS  Can use the HFCMEDCOUNSELING dot phrase here   PLAN    Time spent: *** minutes

## 2023-07-19 NOTE — Progress Notes (Signed)
 Height:  5'7"    Weight: 251 lbs BMI: 39.31  Today's Date: 07/19/23  STOP BANG RISK ASSESSMENT S (snore) Have you been told that you snore?     YES   T (tired) Are you often tired, fatigued, or sleepy during the day?   YES  O (obstruction) Do you stop breathing, choke, or gasp during sleep? NO   P (pressure) Do you have or are you being treated for high blood pressure? NO   B (BMI) Is your body index greater than 35 kg/m? YES   A (age) Are you 40 years old or older? NO   N (neck) Do you have a neck circumference greater than 16 inches?      G (gender) Are you a female? NO   TOTAL STOP/BANG "YES" ANSWERS 3                                                                       For Office Use Only              Procedure Order Form    YES to 3+ Stop Bang questions OR two clinical symptoms - patient qualifies for WatchPAT (CPT 95800)      Clinical Notes: Will consult Sleep Specialist and refer for management of therapy due to patient increased risk of Sleep Apnea. Ordering a sleep study due to the following two clinical symptoms: Excessive daytime sleepiness G47.10 /  Insomnia G47.00

## 2023-07-19 NOTE — Patient Instructions (Signed)
 Great to see you today!!!  Medication Changes:  STOP Entresto  START Losartan 12.5 mg (1/2 tab) Daily   Testing/Procedures:  Your provider has recommended that you have a home sleep study (Itamar Test).  We have provided you with the equipment in our office today. Please go ahead and download the app. Your PIN number is [id number] please proceed with testing in next few nights. Once you have completed the test you just dispose of the equipment, the information is automatically uploaded to us  via blue-tooth technology. If your test is positive for sleep apnea and you need a home CPAP machine you will be contacted by Dr Charl Concha office [facility]) to set this up.  Special Instructions // Education:  Do the following things EVERYDAY: Weigh yourself in the morning before breakfast. Write it down and keep it in a log. Take your medicines as prescribed Eat low salt foods--Limit salt (sodium) to 2000 mg per day.  Stay as active as you can everyday Limit all fluids for the day to less than 2 liters  Follow-Up in: 1 month   If you have any questions or concerns before your next appointment please send us  a message through [place] or call our office at [phone], If it is after office hours your call will be answered by our answering service and directed appropriately.     At the [facility], you and your health needs are our priority. We have a designated team specialized in the treatment of Heart Failure. This Care Team includes your primary Heart Failure Specialized Cardiologist (physician), Advanced Practice Providers (APPs- Physician Assistants and Nurse Practitioners), and Pharmacist who all work together to provide you with the care you need, when you need it.   You may see any of the following providers on your designated Care Team at your next follow up:  Dr. Jules Oar Dr. Peder Bourdon Dr. Alwin Baars Dr. Judyth Nunnery Nieves Bars, NP Ruddy Corral,  [place] [address] [place], [place] Dennise Fitz, NP [place] Lee, NP [place] Dellen, NP Bevely Brush, PharmD

## 2023-07-23 ENCOUNTER — Telehealth: Payer: Self-pay | Admitting: Internal Medicine

## 2023-07-23 NOTE — Telephone Encounter (Signed)
 Patient contacted the cardiology after hours line to discuss a concern for a possible blood clot in her left arm.  Patient states that she has a hard nodule at her left arm similar to what she felt with previous blood clot.  I informed her that she will need to have a ultrasound done of her arm confirm.  Patient plan to go to an urgent care facility later today to see if an ultrasound can be completed there because she prefer not being seen at our emergency department.

## 2023-07-25 ENCOUNTER — Encounter: Payer: Self-pay | Admitting: Family

## 2023-07-25 ENCOUNTER — Ambulatory Visit: Payer: MEDICAID | Admitting: Family

## 2023-07-25 VITALS — BP 80/60 | HR 84 | Ht 67.0 in | Wt 248.8 lb

## 2023-07-25 DIAGNOSIS — E538 Deficiency of other specified B group vitamins: Secondary | ICD-10-CM | POA: Diagnosis not present

## 2023-07-25 DIAGNOSIS — E042 Nontoxic multinodular goiter: Secondary | ICD-10-CM

## 2023-07-25 DIAGNOSIS — I10 Essential (primary) hypertension: Secondary | ICD-10-CM | POA: Diagnosis not present

## 2023-07-25 DIAGNOSIS — R5383 Other fatigue: Secondary | ICD-10-CM

## 2023-07-25 DIAGNOSIS — E559 Vitamin D deficiency, unspecified: Secondary | ICD-10-CM

## 2023-07-25 DIAGNOSIS — R7303 Prediabetes: Secondary | ICD-10-CM

## 2023-07-25 DIAGNOSIS — E785 Hyperlipidemia, unspecified: Secondary | ICD-10-CM

## 2023-07-25 DIAGNOSIS — E782 Mixed hyperlipidemia: Secondary | ICD-10-CM

## 2023-07-25 NOTE — Progress Notes (Signed)
 Established Patient Office Visit  Subjective:  Patient ID: Elizabeth Spencer, female    DOB: 08-01-1985  Age: 38 y.o. MRN: [id number]  Chief Complaint  Patient presents with   Follow-up    1 week follow up    Patient is here today for her 1 week follow up.  She has been feeling fairly well since last appointment.   She does not have additional concerns to discuss today.  Labs are due today.  She needs refills.   I have reviewed her active problem list, medication list, allergies, notes from last encounter, lab results for her appointment today.      No other concerns at this time.   Past Medical History:  Diagnosis Date   Aneurysm (HCC) 01/04/2014   Arthritis    Asthma    WELL CONTROLLED   Brain aneurysm 2007   NEUROLOGY NOTE DOES NOT MENTION ANEURYSM BUT PT STATES SHE DID NOT HAVE TO HAVE SURGERY   Complication of anesthesia    FOR 1 C-SECTION PT WAS ITCHING AND VERY RED ON HER FACE   Cough, persistent 01/27/2016   Dermatitis due to sunburn 11/08/2013   Family history of adverse reaction to anesthesia    brother, neice and nephew got red in face with hives   Gallstones    GERD (gastroesophageal reflux disease)    Gonorrhea 06/20/2020   Headache    CHRONIC HEADACHES   History of chronic cough    DRY   Loss of memory 05/30/2017   Perforation of left tympanic membrane 05/17/2013   Pneumonia    PTSD (post-traumatic stress disorder)    PTSD (post-traumatic stress disorder)    Thyroid  condition    PT WAS JUST TOLD ON 08-25-17 THAT SHE HAS A THYROID  PROBLEM AND IS GOING TO F/U WITH ENDOCRINOLOGIST IN 2 WEEKS   Trichomonas vaginalis (TV) infection 06/20/2020    Past Surgical History:  Procedure Laterality Date   ABDOMINAL HYSTERECTOMY     CESAREAN SECTION     x3   CORONARY ARTERY BYPASS GRAFT N/A 09/02/2022   Procedure: CORONARY ARTERY BYPASS GRAFTING (CABG) X 1 USING LEFT INTERNAL MAMMARY ARTERY;  Surgeon: Lucas Dorise POUR, MD;  [facility];  Service: Open  Heart Surgery;  Laterality: N/A;   CYSTOSCOPY N/A 09/29/2017   Procedure: CYSTOSCOPY;  Surgeon: Lake Read, MD;  Location: [facility];  Service: Gynecology;  Laterality: N/A;   CYSTOSCOPY N/A 11/05/2017   Procedure: CYSTOSCOPY;  Surgeon: Lake Read, MD;  Location: [facility];  Service: Gynecology;  Laterality: N/A;   HYSTEROSCOPY WITH D & C N/A 09/01/2017   Procedure: DILATATION AND CURETTAGE /HYSTEROSCOPY;  Surgeon: Lake Read, MD;  Location: [facility];  Service: Gynecology;  Laterality: N/A;   LAPAROSCOPY N/A 09/01/2017   Procedure: LAPAROSCOPY OPERATIVE with biopsy;  Surgeon: Lake Read, MD;  Location: [facility];  Service: Gynecology;  Laterality: N/A;   LAPAROSCOPY N/A 11/05/2017   Procedure: LAPAROSCOPY DIAGNOSTIC;  Surgeon: Lake Read, MD;  Location: [facility];  Service: Gynecology;  Laterality: N/A;   LEFT HEART CATH AND CORONARY ANGIOGRAPHY N/A 09/01/2022   Procedure: LEFT HEART CATH AND CORONARY ANGIOGRAPHY;  Surgeon: Mady Bruckner, MD;  Location: [facility];  Service: Cardiovascular;  Laterality: N/A;   REPAIR VAGINAL CUFF N/A 11/05/2017   Procedure: REPAIR VAGINAL CUFF;  Surgeon: Lake Read, MD;  Location: [facility];  Service: Gynecology;  Laterality: N/A;   TEE WITHOUT CARDIOVERSION N/A 09/02/2022   Procedure: TRANSESOPHAGEAL ECHOCARDIOGRAM;  Surgeon: Lucas Dorise POUR,  MD;  Location: [facility];  Service: Open Heart Surgery;  Laterality: N/A;   TOTAL LAPAROSCOPIC HYSTERECTOMY WITH SALPINGECTOMY Bilateral 09/29/2017   Procedure: TOTAL LAPAROSCOPIC HYSTERECTOMY WITH SALPINGECTOMY;  Surgeon: Lake Read, MD;  Location: [facility];  Service: Gynecology;  Laterality: Bilateral;    Social History   Socioeconomic History   Marital status: Single    Spouse name: Not on file   Number of children: Not on file   Years of education: Not on file   Highest education level: Not on file  Occupational History   Not on file  Tobacco Use   Smoking status:  Every Day    Current packs/day: 0.50    Average packs/day: 0.5 packs/day for 18.0 years (9.0 ttl pk-yrs)    Types: Cigarettes    Passive exposure: Past   Smokeless tobacco: Never  Vaping Use   Vaping status: Never Used  Substance and Sexual Activity   Alcohol use: Yes    Alcohol/week: 0.0 standard drinks of alcohol    Comment: occasionally   Drug use: Not Currently    Types: Marijuana, Cocaine   Sexual activity: Yes    Partners: Male    Birth control/protection: Surgical    Comment: Hysterectomy  Other Topics Concern   Not on file  Social History Narrative   Not on file   Social Drivers of Health   Financial Resource Strain: Not on file  Food Insecurity: No Food Insecurity (12/11/2022)   Hunger Vital Sign    Worried About Running Out of Food in the Last Year: Never true    Ran Out of Food in the Last Year: Never true  Transportation Needs: No Transportation Needs (12/11/2022)   PRAPARE - Administrator, Civil Service (Medical): No    Lack of Transportation (Non-Medical): No  Physical Activity: Not on file  Stress: Not on file  Social Connections: Not on file  Intimate Partner Violence: Not At Risk (12/11/2022)   Humiliation, Afraid, Rape, and Kick questionnaire    Fear of Current or Ex-Partner: No    Emotionally Abused: No    Physically Abused: No    Sexually Abused: No    Family History  Problem Relation Age of Onset   Hypertension Mother    Alcohol abuse Mother    Deep vein thrombosis Mother    Alcohol abuse Father    COPD Father    Emphysema Father    Heart disease Father     Allergies  Allergen Reactions   Azithromycin Swelling and Other (See Comments)   Clindamycin  Swelling   Codeine Hives and Other (See Comments)    Nausea/dizzy Nausea/dizzy   Meloxicam  Other (See Comments)    Burns up from the inside    Neurontin [Gabapentin] Other (See Comments)    Makes her pass out and not remember what happened prior to taking med.   Penicillins  Anaphylaxis    Patient allergic to all cillins Has patient had a PCN reaction causing immediate rash, facial/tongue/throat swelling, SOB or lightheadedness with hypotension: Yes Has patient had a PCN reaction causing severe rash involving mucus membranes or skin necrosis: Yes Has patient had a PCN reaction that required hospitalization: No Has patient had a PCN reaction occurring within the last 10 years: Yes If all of the above answers are NO, then may proceed with Cephalosporin use.    Clindamycin /Lincomycin Swelling   Other Hives and Other (See Comments)    Berry flavored food and drinks   Diphenhydramine  Hcl Rash  and Other (See Comments)   Doxycycline Rash   Egg-Derived Products Rash    Review of Systems  Constitutional:  Positive for malaise/fatigue.  All other systems reviewed and are negative.      Objective:   BP (!) 80/60   Pulse 84   Ht 5' 7 (1.702 m)   Wt 248 lb 12.8 oz (112.9 kg)   LMP 08/22/2017 Comment: says she just spots.  SpO2 98%   BMI 38.97 kg/m   Vitals:   07/25/23 1354  BP: (!) 80/60  Pulse: 84  Height: 5' 7 (1.702 m)  Weight: 248 lb 12.8 oz (112.9 kg)  SpO2: 98%  BMI (Calculated): 38.96    Physical Exam Vitals and nursing note reviewed.  Constitutional:      Appearance: Normal appearance. She is normal weight.  HENT:     Head: Normocephalic.  Eyes:     Extraocular Movements: Extraocular movements intact.     Conjunctiva/sclera: Conjunctivae normal.     Pupils: Pupils are equal, round, and reactive to light.  Cardiovascular:     Rate and Rhythm: Normal rate.  Pulmonary:     Effort: Pulmonary effort is normal.  Neurological:     General: No focal deficit present.     Mental Status: She is alert and oriented to person, place, and time. Mental status is at baseline.  Psychiatric:        Attention and Perception: She is inattentive.        Mood and Affect: Mood is anxious. Affect is labile.        Behavior: Behavior is agitated.  Behavior is cooperative.        Thought Content: Thought content normal.        Judgment: Judgment is impulsive.      No results found for any visits on 07/25/23.  Recent Results (from the past 2160 hours)  CBC     Status: Abnormal   Collection Time: 05/25/23  4:20 PM  Result Value Ref Range   WBC 13.3 (H) 4.0 - 10.5 K/uL   RBC 5.45 (H) 3.87 - 5.11 MIL/uL   Hemoglobin 16.2 (H) 12.0 - 15.0 g/dL   HCT 51.1 (H) 63.9 - 53.9 %   MCV 89.5 80.0 - 100.0 fL   MCH 29.7 26.0 - 34.0 pg   MCHC 33.2 30.0 - 36.0 g/dL   RDW 87.0 88.4 - 84.4 %   Platelets 283 150 - 400 K/uL   nRBC 0.0 0.0 - 0.2 %    Comment: Performed at [facility], [address] [place], [place] [address]  Troponin I (High Sensitivity)     Status: None   Collection Time: 05/25/23  4:20 PM  Result Value Ref Range   Troponin I (High Sensitivity) 5 <18 ng/L    Comment: (NOTE) Elevated high sensitivity troponin I (hsTnI) values and significant  changes across serial measurements may suggest ACS but many other  chronic and acute conditions are known to elevate hsTnI results.  Refer to the Links section for chest pain algorithms and additional  guidance. Performed at [facility], [address] [place], [place] [address]   Protime-INR (order if Patient is taking Coumadin / Warfarin)     Status: None   Collection Time: 05/25/23  4:20 PM  Result Value Ref Range   Prothrombin Time 14.2 11.4 - 15.2 seconds   INR 1.1 0.8 - 1.2    Comment: (NOTE) INR goal varies based on device and disease states. Performed  at [facility], [address] [place], [place] [address]   Resp panel by RT-PCR (RSV, Flu A&B, Covid) Anterior Nasal Swab     Status: None   Collection Time: 05/25/23  4:20 PM   Specimen: Anterior Nasal Swab  Result Value Ref Range   SARS Coronavirus 2 by RT PCR NEGATIVE NEGATIVE    Comment: (NOTE) SARS-CoV-2 target nucleic acids are NOT DETECTED.  The SARS-CoV-2 RNA is generally  detectable in upper respiratory specimens during the acute phase of infection. The lowest concentration of SARS-CoV-2 viral copies this assay can detect is 138 copies/mL. A negative result does not preclude SARS-Cov-2 infection and should not be used as the sole basis for treatment or other patient management decisions. A negative result may occur with  improper specimen collection/handling, submission of specimen other than nasopharyngeal swab, presence of viral mutation(s) within the areas targeted by this assay, and inadequate number of viral copies(<138 copies/mL). A negative result must be combined with clinical observations, patient history, and epidemiological information. The expected result is Negative.  Fact Sheet for Patients:  BloggerCourse.com  Fact Sheet for Healthcare Providers:  SeriousBroker.it  This test is no t yet approved or cleared by the [place]  FDA and  has been authorized for detection and/or diagnosis of SARS-CoV-2 by FDA under an Emergency Use Authorization (EUA). This EUA will remain  in effect (meaning this test can be used) for the duration of the COVID-19 declaration under Section 564(b)(1) of the Act, 21 U.S.C.section 360bbb-3(b)(1), unless the authorization is terminated  or revoked sooner.       Influenza A by PCR NEGATIVE NEGATIVE   Influenza B by PCR NEGATIVE NEGATIVE    Comment: (NOTE) The Xpert Xpress SARS-CoV-2/FLU/RSV plus assay is intended as an aid in the diagnosis of influenza from Nasopharyngeal swab specimens and should not be used as a sole basis for treatment. Nasal washings and aspirates are unacceptable for Xpert Xpress SARS-CoV-2/FLU/RSV testing.  Fact Sheet for Patients: BloggerCourse.com  Fact Sheet for Healthcare Providers: SeriousBroker.it  This test is not yet approved or cleared by the [place]  FDA and has  been authorized for detection and/or diagnosis of SARS-CoV-2 by FDA under an Emergency Use Authorization (EUA). This EUA will remain in effect (meaning this test can be used) for the duration of the COVID-19 declaration under Section 564(b)(1) of the Act, 21 U.S.C. section 360bbb-3(b)(1), unless the authorization is terminated or revoked.     Resp Syncytial Virus by PCR NEGATIVE NEGATIVE    Comment: (NOTE) Fact Sheet for Patients: BloggerCourse.com  Fact Sheet for Healthcare Providers: SeriousBroker.it  This test is not yet approved or cleared by the [place]  FDA and has been authorized for detection and/or diagnosis of SARS-CoV-2 by FDA under an Emergency Use Authorization (EUA). This EUA will remain in effect (meaning this test can be used) for the duration of the COVID-19 declaration under Section 564(b)(1) of the Act, 21 U.S.C. section 360bbb-3(b)(1), unless the authorization is terminated or revoked.  Performed at [facility], [address] [place], [place] [address]   Basic metabolic panel     Status: Abnormal   Collection Time: 05/25/23  5:07 PM  Result Value Ref Range   Sodium 137 135 - 145 mmol/L   Potassium 4.2 3.5 - 5.1 mmol/L    Comment: HEMOLYSIS AT THIS LEVEL MAY AFFECT RESULT   Chloride 105 98 - 111 mmol/L   CO2 22 22 - 32 mmol/L   Glucose, Bld 92 70 -  99 mg/dL    Comment: Glucose reference range applies only to samples taken after fasting for at least 8 hours.   BUN 11 6 - 20 mg/dL   Creatinine, Ser 9.12 0.44 - 1.00 mg/dL   Calcium  8.8 (L) 8.9 - 10.3 mg/dL   GFR, Estimated >39 >39 mL/min    Comment: (NOTE) Calculated using the CKD-EPI Creatinine Equation (2021)    Anion gap 10 5 - 15    Comment: Performed at [facility], [address] [place], [place] [address]  Troponin I (High Sensitivity)     Status: None   Collection Time: 05/25/23  6:45 PM  Result Value Ref Range    Troponin I (High Sensitivity) 5 <18 ng/L    Comment: (NOTE) Elevated high sensitivity troponin I (hsTnI) values and significant  changes across serial measurements may suggest ACS but many other  chronic and acute conditions are known to elevate hsTnI results.  Refer to the Links section for chest pain algorithms and additional  guidance. Performed at [facility], [address] [place], [place] [address]   D-dimer, quantitative     Status: None   Collection Time: 05/25/23  6:45 PM  Result Value Ref Range   D-Dimer, Quant 0.30 0.00 - 0.50 ug/mL-FEU    Comment: (NOTE) At the manufacturer cut-off value of 0.5 g/mL FEU, this assay has a negative predictive value of 95-100%.This assay is intended for use in conjunction with a clinical pretest probability (PTP) assessment model to exclude pulmonary embolism (PE) and deep venous thrombosis (DVT) in outpatients suspected of PE or DVT. Results should be correlated with clinical presentation. Performed at [facility], [address] [place], [place] [address]   POCT Urinalysis Dipstick [phone])     Status: Abnormal   Collection Time: 06/03/23  3:12 PM  Result Value Ref Range   Color, UA yellow    Clarity, UA clear    Glucose, UA Positive (A) Negative   Bilirubin, UA negative    Ketones, UA negative    Spec Grav, UA 1.020 1.010 - 1.025   Blood, UA negative    pH, UA 6.0 5.0 - 8.0   Protein, UA Negative Negative   Urobilinogen, UA 0.2 0.2 or 1.0 E.U./dL   Nitrite, UA negative    Leukocytes, UA Negative Negative   Appearance clear    Odor no   CBC with Diff     Status: Abnormal   Collection Time: 06/03/23  3:24 PM  Result Value Ref Range   WBC 9.6 3.4 - 10.8 x10E3/uL   RBC 5.32 (H) 3.77 - 5.28 x10E6/uL   Hemoglobin 15.9 11.1 - 15.9 g/dL   Hematocrit 51.7 (H) 65.9 - 46.6 %   MCV 91 79 - 97 fL   MCH 29.9 26.6 - 33.0 pg   MCHC 33.0 31.5 - 35.7 g/dL   RDW 87.9 88.2 - 84.5 %   Platelets 300 150 - 450  x10E3/uL   Neutrophils 56 Not Estab. %   Lymphs 33 Not Estab. %   Monocytes 7 Not Estab. %   Eos 3 Not Estab. %   Basos 1 Not Estab. %   Neutrophils Absolute 5.3 1.4 - 7.0 x10E3/uL   Lymphocytes Absolute 3.2 (H) 0.7 - 3.1 x10E3/uL   Monocytes Absolute 0.7 0.1 - 0.9 x10E3/uL   EOS (ABSOLUTE) 0.3 0.0 - 0.4 x10E3/uL   Basophils Absolute 0.1 0.0 - 0.2 x10E3/uL   Immature Granulocytes 0 Not Estab. %   Immature Grans (  Abs) 0.0 0.0 - 0.1 x10E3/uL  Reticulocytes Count     Status: None   Collection Time: 06/03/23  3:24 PM  Result Value Ref Range   Retic Ct Pct 1.3 0.6 - 2.6 %  Drug Screen 10 W/Conf, Se     Status: None   Collection Time: 06/03/23  3:24 PM  Result Value Ref Range   Amphetamines, IA Negative Cutoff:50 ng/mL   Barbiturates, IA Negative Cutoff:0.1 ug/mL   Benzodiazepines, IA Negative Cutoff:20 ng/mL   Cocaine & Metabolite, IA Negative Cutoff:25 ng/mL   Phencyclidine, IA Negative Cutoff:8 ng/mL   THC(Marijuana) Metabolite, IA Negative Cutoff:5 ng/mL   Opiates, IA Negative Cutoff:5 ng/mL   Oxycodones, IA Negative Cutoff:5 ng/mL   Methadone, IA Negative Cutoff:25 ng/mL   Propoxyphene, IA Negative Cutoff:50 ng/mL    Comment: This test was developed and its performance characteristics determined by [facility].  It has not been cleared or approved by the Food and Drug Administration.   [id number]     Status: Abnormal   Collection Time: 06/03/23  3:25 PM  Result Value Ref Range   TSH <0.005 (L) 0.450 - 4.500 uIU/mL   T3, Free 7.1 (H) 2.0 - 4.4 pg/mL   Free T4 1.80 (H) 0.82 - 1.77 ng/dL  POC Influenza A&B (Binax test)     Status: None   Collection Time: 06/03/23  3:29 PM  Result Value Ref Range   Influenza A, POC Negative Negative   Influenza B, POC Negative Negative       Assessment & Plan Multinodular goiter I have referred pt to endocrine, but she does not want to continue seeing Dr Evangelina, She has been having continued issues with her thyroid . No other concerns  today.   Vitamin D  deficiency, unspecified B12 deficiency due to diet Other fatigue Checking labs today.  Will continue supplements as needed.   - Vitamin D  - Vitamin B12 - TSH  Essential hypertension, benign Blood pressure well controlled with current medications.  Continue current therapy.  Will reassess at follow up.   - CBC w/Diff - CMP w/eGFR  Mixed hyperlipidemia Checking labs today.  Continue current therapy for lipid control. Will modify as needed based on labwork results.   -CMP w/eGFR -Lipid Panel  Prediabetes A1C Continues to be in prediabetic ranges.  Will reassess at follow up after next lab check.  Patient counseled on dietary choices and verbalized understanding.   -CBC w/Diff -CMP w/eGFR -Hemoglobin A1C     Return in about 2 months (around 09/24/2023) for F/U.   Total time spent: 20 minutes  ALAN CHRISTELLA ARRANT, FNP  07/25/2023   This document may have been prepared by [facility] Voice Recognition software and as such may include unintentional dictation errors.

## 2023-07-26 LAB — CMP14+EGFR
ALT: 21 IU/L (ref 0–32)
AST: 20 IU/L (ref 0–40)
Albumin: 4.2 g/dL (ref 3.9–4.9)
Alkaline Phosphatase: 93 IU/L (ref 44–121)
BUN/Creatinine Ratio: 11 (ref 9–23)
BUN: 8 mg/dL (ref 6–24)
Bilirubin Total: 0.2 mg/dL (ref 0.0–1.2)
CO2: 16 mmol/L — ABNORMAL LOW (ref 20–29)
Calcium: 9.6 mg/dL (ref 8.7–10.2)
Chloride: 108 mmol/L — ABNORMAL HIGH (ref 96–106)
Creatinine, Ser: 0.75 mg/dL (ref 0.57–1.00)
Globulin, Total: 2.3 g/dL (ref 1.5–4.5)
Glucose: 85 mg/dL (ref 70–99)
Potassium: 3.8 mmol/L (ref 3.5–5.2)
Sodium: 141 mmol/L (ref 134–144)
Total Protein: 6.5 g/dL (ref 6.0–8.5)
eGFR: 103 mL/min/1.73

## 2023-07-26 LAB — CBC WITH DIFFERENTIAL/PLATELET
Basophils Absolute: 0.1 x10E3/uL (ref 0.0–0.2)
Basos: 1 %
EOS (ABSOLUTE): 0.2 x10E3/uL (ref 0.0–0.4)
Eos: 2 %
Hematocrit: 48.5 % — ABNORMAL HIGH (ref 34.0–46.6)
Hemoglobin: 15.5 g/dL (ref 11.1–15.9)
Immature Grans (Abs): 0 x10E3/uL (ref 0.0–0.1)
Immature Granulocytes: 0 %
Lymphocytes Absolute: 2.7 x10E3/uL (ref 0.7–3.1)
Lymphs: 36 %
MCH: 28.6 pg (ref 26.6–33.0)
MCHC: 32 g/dL (ref 31.5–35.7)
MCV: 90 fL (ref 79–97)
Monocytes Absolute: 0.6 x10E3/uL (ref 0.1–0.9)
Monocytes: 8 %
Neutrophils Absolute: 4 x10E3/uL (ref 1.4–7.0)
Neutrophils: 53 %
Platelets: 242 x10E3/uL (ref 150–450)
RBC: 5.42 x10E6/uL — ABNORMAL HIGH (ref 3.77–5.28)
RDW: 12.6 % (ref 11.7–15.4)
WBC: 7.5 x10E3/uL (ref 3.4–10.8)

## 2023-07-26 LAB — LIPID PANEL
Chol/HDL Ratio: 3.4 ratio (ref 0.0–4.4)
Cholesterol, Total: 134 mg/dL (ref 100–199)
HDL: 40 mg/dL (ref >39)
LDL Chol Calc (NIH): 73 mg/dL (ref 0–99)
Triglycerides: 112 mg/dL (ref 0–149)
VLDL Cholesterol Cal: 21 mg/dL (ref 5–40)

## 2023-07-26 LAB — HEMOGLOBIN A1C
Est. average glucose Bld gHb Est-mCnc: 108 mg/dL
Hgb A1c MFr Bld: 5.4 % (ref 4.8–5.6)

## 2023-07-26 LAB — TSH+T4F+T3FREE
Free T4: 1.76 ng/dL (ref 0.82–1.77)
T3, Free: 4.4 pg/mL (ref 2.0–4.4)
TSH: <0.005 u[IU]/mL — ABNORMAL LOW (ref 0.450–4.500)

## 2023-07-26 LAB — VITAMIN D 25 HYDROXY (VIT D DEFICIENCY, FRACTURES): Vit D, 25-Hydroxy: 15.3 ng/mL — ABNORMAL LOW (ref 30.0–100.0)

## 2023-07-26 LAB — VITAMIN B12: Vitamin B-12: 435 pg/mL (ref 232–1245)

## 2023-07-26 NOTE — Telephone Encounter (Signed)
 Patient had office visit yesterday and discussed

## 2023-07-26 NOTE — Telephone Encounter (Signed)
Patient had office visit yesterday.

## 2023-08-01 ENCOUNTER — Telehealth: Payer: Self-pay

## 2023-08-01 ENCOUNTER — Other Ambulatory Visit: Payer: Self-pay

## 2023-08-01 ENCOUNTER — Other Ambulatory Visit: Payer: Self-pay | Admitting: Family

## 2023-08-01 ENCOUNTER — Telehealth (HOSPITAL_COMMUNITY): Payer: Self-pay

## 2023-08-01 DIAGNOSIS — I5022 Chronic systolic (congestive) heart failure: Secondary | ICD-10-CM

## 2023-08-01 MED ORDER — EPIPEN 2-PAK 0.3 MG/0.3ML IJ SOAJ: INTRAMUSCULAR | 1 refills | Status: DC

## 2023-08-01 MED ORDER — SPIRONOLACTONE 25 MG PO TABS: 25.0000 mg | ORAL_TABLET | Freq: Every day | ORAL | 3 refills | Status: DC

## 2023-08-01 MED ORDER — METOPROLOL SUCCINATE ER 25 MG PO TB24: 25.0000 mg | ORAL_TABLET | Freq: Every day | ORAL | 3 refills | Status: DC

## 2023-08-01 MED ORDER — ATORVASTATIN CALCIUM 40 MG PO TABS: 40.0000 mg | ORAL_TABLET | Freq: Every day | ORAL | 3 refills | Status: DC

## 2023-08-01 MED ORDER — ONDANSETRON 4 MG PO TBDP: 4.0000 mg | ORAL_TABLET | Freq: Three times a day (TID) | ORAL | 0 refills | Status: DC | PRN

## 2023-08-01 MED ORDER — EPIPEN 2-PAK 0.3 MG/0.3ML IJ SOAJ: INTRAMUSCULAR | 1 refills | Status: AC

## 2023-08-01 MED ORDER — CLOPIDOGREL BISULFATE 75 MG PO TABS: 75.0000 mg | ORAL_TABLET | Freq: Every day | ORAL | 3 refills | Status: AC

## 2023-08-01 MED ORDER — OMEPRAZOLE 20 MG PO CPDR: 20.0000 mg | DELAYED_RELEASE_CAPSULE | Freq: Two times a day (BID) | ORAL | 1 refills | Status: AC

## 2023-08-01 MED ORDER — CETIRIZINE HCL 10 MG PO TABS: 10.0000 mg | ORAL_TABLET | Freq: Every day | ORAL | 1 refills | Status: DC

## 2023-08-01 MED ORDER — MONTELUKAST SODIUM 10 MG PO TABS: 10.0000 mg | ORAL_TABLET | Freq: Every day | ORAL | 1 refills | Status: DC

## 2023-08-01 MED ORDER — DAPAGLIFLOZIN PROPANEDIOL 10 MG PO TABS: 10.0000 mg | ORAL_TABLET | Freq: Every day | ORAL | 1 refills | Status: DC

## 2023-08-01 NOTE — Telephone Encounter (Signed)
 Received call from pt, very frustrated. Pt requesting refills at 90 supply. Refilled requested meds to [facility] per pt. Pt states that her pcp took her off of losartan  due to low bps 80's systolic. Updated med list. No further questions at this time.

## 2023-08-01 NOTE — Telephone Encounter (Signed)
 Attempted to reach patient to discuss upcoming procedure, no answer. Unable to leave VM due to mailbox full.

## 2023-08-01 NOTE — Telephone Encounter (Signed)
 Received a return call from patient to discuss upcoming procedure.   Confirmed patient is scheduled for a Subcutaneous ICD  on Monday, May 5 with Dr. Clinton Danas. Instructed patient to arrive at the Main Entrance A at [facility]: [address] [place], [place] [address] and check in at Admitting at 10:30 AM.   Labs completed  Any recent signs of acute illness or been started on antibiotics?  Reports she completed a second course of antibiotics on last week for URI and feels recovered other than an intermittent cough than worsens with activity or when lying down. States, "goes into coughing spells." Will make Dr. Daneil Dunker aware. Any new medications started? No Any medications to hold? Hold Plavix  5 days and Farxiga  3 days Medication instructions:  On the morning of your procedure DO NOT take any medication. No eating or drinking after midnight prior to procedure.   The night before your procedure and the morning of your procedure, wash thoroughly with the CHG surgical soap from the neck down, paying special attention to the area where your procedure will be performed.  Advised of plan to go home the same day and will only stay overnight if medically necessary. You MUST have a responsible adult to drive you home and MUST be with you the first 24 hours after you arrive home.  Patient verbalized understanding to all instructions provided and agreed to proceed with procedure.

## 2023-08-02 NOTE — Telephone Encounter (Signed)
 Patient called to find out if she was OK to proceed with procedure because she did not want to stop Plavix  if she did not have to.   Reached back out to Dr. Daneil Dunker and he advised, If she has completed antibiotics prior to implant then okay to proceed.   Informed patient of provider feedback and she voiced understanding.

## 2023-08-05 NOTE — Pre-Procedure Instructions (Signed)
 Instructed patient on the following items: Arrival time 1030 Nothing to eat or drink after midnight No meds AM of procedure Responsible person to drive you home and stay with you for 24 hrs Wash with special soap night before and morning of procedure If on anti-coagulant drug instructions Plavix - stopped yesterday

## 2023-08-08 ENCOUNTER — Ambulatory Visit (HOSPITAL_BASED_OUTPATIENT_CLINIC_OR_DEPARTMENT_OTHER): Payer: MEDICAID

## 2023-08-08 ENCOUNTER — Encounter (HOSPITAL_COMMUNITY): Payer: Self-pay | Admitting: Cardiology

## 2023-08-08 ENCOUNTER — Encounter (HOSPITAL_COMMUNITY)
Admission: RE | Disposition: A | Discharge status: Home / Self Care | Payer: Self-pay | Source: Home / Self Care | Attending: Cardiology

## 2023-08-08 ENCOUNTER — Ambulatory Visit (HOSPITAL_COMMUNITY)
Admission: RE | Admit: 2023-08-08 | Discharge: 2023-08-09 | Disposition: A | Discharge status: Home / Self Care | Payer: MEDICAID | Attending: Cardiology | Admitting: Cardiology

## 2023-08-08 ENCOUNTER — Other Ambulatory Visit: Payer: Self-pay

## 2023-08-08 ENCOUNTER — Ambulatory Visit (HOSPITAL_COMMUNITY): Payer: MEDICAID

## 2023-08-08 DIAGNOSIS — I255 Ischemic cardiomyopathy: Secondary | ICD-10-CM | POA: Diagnosis not present

## 2023-08-08 DIAGNOSIS — Z79899 Other long term (current) drug therapy: Secondary | ICD-10-CM | POA: Insufficient documentation

## 2023-08-08 DIAGNOSIS — R7303 Prediabetes: Secondary | ICD-10-CM | POA: Insufficient documentation

## 2023-08-08 DIAGNOSIS — E782 Mixed hyperlipidemia: Secondary | ICD-10-CM | POA: Insufficient documentation

## 2023-08-08 DIAGNOSIS — G8929 Other chronic pain: Secondary | ICD-10-CM | POA: Diagnosis not present

## 2023-08-08 DIAGNOSIS — F4310 Post-traumatic stress disorder, unspecified: Secondary | ICD-10-CM | POA: Diagnosis not present

## 2023-08-08 DIAGNOSIS — Z7982 Long term (current) use of aspirin: Secondary | ICD-10-CM | POA: Diagnosis not present

## 2023-08-08 DIAGNOSIS — J45909 Unspecified asthma, uncomplicated: Secondary | ICD-10-CM | POA: Insufficient documentation

## 2023-08-08 DIAGNOSIS — I2510 Atherosclerotic heart disease of native coronary artery without angina pectoris: Secondary | ICD-10-CM | POA: Diagnosis not present

## 2023-08-08 DIAGNOSIS — I110 Hypertensive heart disease with heart failure: Secondary | ICD-10-CM | POA: Insufficient documentation

## 2023-08-08 DIAGNOSIS — Z951 Presence of aortocoronary bypass graft: Secondary | ICD-10-CM | POA: Diagnosis not present

## 2023-08-08 DIAGNOSIS — F319 Bipolar disorder, unspecified: Secondary | ICD-10-CM | POA: Diagnosis not present

## 2023-08-08 DIAGNOSIS — F1911 Other psychoactive substance abuse, in remission: Secondary | ICD-10-CM | POA: Insufficient documentation

## 2023-08-08 DIAGNOSIS — K219 Gastro-esophageal reflux disease without esophagitis: Secondary | ICD-10-CM | POA: Insufficient documentation

## 2023-08-08 DIAGNOSIS — J449 Chronic obstructive pulmonary disease, unspecified: Secondary | ICD-10-CM | POA: Diagnosis not present

## 2023-08-08 DIAGNOSIS — I5022 Chronic systolic (congestive) heart failure: Secondary | ICD-10-CM | POA: Insufficient documentation

## 2023-08-08 DIAGNOSIS — Z95810 Presence of automatic (implantable) cardiac defibrillator: Secondary | ICD-10-CM

## 2023-08-08 HISTORY — PX: SUBQ ICD IMPLANT: EP1223

## 2023-08-08 SURGERY — SUBQ ICD IMPLANT
Anesthesia: General

## 2023-08-08 MED ORDER — OXYCODONE HCL 5 MG PO TABS
5.0000 mg | ORAL_TABLET | Freq: Once | ORAL | Status: AC | PRN
  Administered 2023-08-08: 5 mg via ORAL

## 2023-08-08 MED ORDER — FENTANYL CITRATE (PF) 100 MCG/2ML IJ SOLN
25.0000 ug | INTRAMUSCULAR | Status: DC | PRN
  Administered 2023-08-08: 50 ug via INTRAVENOUS

## 2023-08-08 MED ORDER — MIDAZOLAM HCL 2 MG/2ML IJ SOLN: 0.5000 mg | Freq: Once | INTRAMUSCULAR | Status: DC | PRN

## 2023-08-08 MED ORDER — FENTANYL CITRATE (PF) 100 MCG/2ML IJ SOLN
INTRAMUSCULAR | Status: AC
  Filled 2023-08-08: qty 2

## 2023-08-08 MED ORDER — ACETAMINOPHEN 325 MG PO TABS
ORAL_TABLET | ORAL | Status: AC
  Filled 2023-08-08: qty 2

## 2023-08-08 MED ORDER — BUPIVACAINE HCL (PF) 0.25 % IJ SOLN
INTRAMUSCULAR | Status: AC
  Filled 2023-08-08: qty 90

## 2023-08-08 MED ORDER — HYDROMORPHONE HCL 1 MG/ML IJ SOLN
INTRAMUSCULAR | Status: AC
Start: 2023-08-08 — End: ?
  Filled 2023-08-08: qty 0.5

## 2023-08-08 MED ORDER — POVIDONE-IODINE 10 % EX SWAB
2.0000 | Freq: Once | CUTANEOUS | Status: AC
  Administered 2023-08-08: 2 via TOPICAL

## 2023-08-08 MED ORDER — ACETAMINOPHEN 325 MG PO TABS
325.0000 mg | ORAL_TABLET | ORAL | Status: DC | PRN
  Administered 2023-08-08: 325 mg via ORAL
  Administered 2023-08-08: 650 mg via ORAL
  Filled 2023-08-08: qty 2

## 2023-08-08 MED ORDER — OXYCODONE HCL 5 MG PO TABS
ORAL_TABLET | ORAL | Status: AC
  Filled 2023-08-08: qty 1

## 2023-08-08 MED ORDER — BUPIVACAINE HCL (PF) 0.25 % IJ SOLN
INTRAMUSCULAR | Status: DC | PRN
  Administered 2023-08-08: 60 mL

## 2023-08-08 MED ORDER — OXYCODONE HCL 5 MG/5ML PO SOLN: 5.0000 mg | Freq: Once | ORAL | Status: AC | PRN

## 2023-08-08 MED ORDER — MIDAZOLAM HCL 2 MG/2ML IJ SOLN
INTRAMUSCULAR | Status: DC | PRN
  Administered 2023-08-08: 2 mg via INTRAVENOUS

## 2023-08-08 MED ORDER — CHLORHEXIDINE GLUCONATE 4 % EX SOLN
4.0000 | Freq: Once | CUTANEOUS | Status: DC
Start: 2023-08-08 — End: 2023-08-08
  Filled 2023-08-08: qty 60

## 2023-08-08 MED ORDER — MEPERIDINE HCL 25 MG/ML IJ SOLN: 6.2500 mg | INTRAMUSCULAR | Status: DC | PRN

## 2023-08-08 MED ORDER — SODIUM CHLORIDE 0.9 % IV SOLN
INTRAVENOUS | Status: AC
  Administered 2023-08-08: 80 mg
  Filled 2023-08-08: qty 2

## 2023-08-08 MED ORDER — DEXMEDETOMIDINE HCL IN NACL 80 MCG/20ML IV SOLN
INTRAVENOUS | Status: DC | PRN
  Administered 2023-08-08 (×5): 8 ug via INTRAVENOUS

## 2023-08-08 MED ORDER — ONDANSETRON HCL 4 MG/2ML IJ SOLN
INTRAMUSCULAR | Status: DC | PRN
  Administered 2023-08-08: 4 mg via INTRAVENOUS

## 2023-08-08 MED ORDER — SUGAMMADEX SODIUM 200 MG/2ML IV SOLN
INTRAVENOUS | Status: DC | PRN
  Administered 2023-08-08: 200 mg via INTRAVENOUS

## 2023-08-08 MED ORDER — LIDOCAINE 2% (20 MG/ML) 5 ML SYRINGE
INTRAMUSCULAR | Status: DC | PRN
  Administered 2023-08-08: 40 mg via INTRAVENOUS

## 2023-08-08 MED ORDER — MIDAZOLAM HCL 2 MG/2ML IJ SOLN
INTRAMUSCULAR | Status: AC
  Filled 2023-08-08: qty 2

## 2023-08-08 MED ORDER — VANCOMYCIN HCL 1500 MG/300ML IV SOLN
1500.0000 mg | INTRAVENOUS | Status: AC
  Administered 2023-08-08: 1500 mg via INTRAVENOUS
  Filled 2023-08-08: qty 300

## 2023-08-08 MED ORDER — FENTANYL CITRATE (PF) 100 MCG/2ML IJ SOLN
INTRAMUSCULAR | Status: AC
  Administered 2023-08-08: 50 ug via INTRAVENOUS
  Filled 2023-08-08: qty 2

## 2023-08-08 MED ORDER — DEXMEDETOMIDINE HCL IN NACL 80 MCG/20ML IV SOLN
INTRAVENOUS | Status: AC
  Filled 2023-08-08: qty 20

## 2023-08-08 MED ORDER — LIDOCAINE 5 % EX PTCH
1.0000 | MEDICATED_PATCH | CUTANEOUS | Status: DC
  Administered 2023-08-09: 1 via TRANSDERMAL
  Filled 2023-08-08: qty 1

## 2023-08-08 MED ORDER — PROPOFOL 10 MG/ML IV BOLUS
INTRAVENOUS | Status: DC | PRN
  Administered 2023-08-08: 50 mg via INTRAVENOUS
  Administered 2023-08-08: 130 mg via INTRAVENOUS

## 2023-08-08 MED ORDER — HYDROMORPHONE HCL 1 MG/ML IJ SOLN
INTRAMUSCULAR | Status: DC | PRN
  Administered 2023-08-08: .5 mg via INTRAVENOUS

## 2023-08-08 MED ORDER — HEPARIN (PORCINE) IN NACL 2000-0.9 UNIT/L-% IV SOLN
INTRAVENOUS | Status: DC | PRN
  Administered 2023-08-08: 1000 mL

## 2023-08-08 MED ORDER — PHENYLEPHRINE HCL-NACL 20-0.9 MG/250ML-% IV SOLN
INTRAVENOUS | Status: DC | PRN
  Administered 2023-08-08: 20 ug/min via INTRAVENOUS

## 2023-08-08 MED ORDER — SODIUM CHLORIDE 0.9 % IV SOLN: 80.0000 mg | INTRAVENOUS | Status: AC

## 2023-08-08 MED ORDER — FENTANYL CITRATE (PF) 250 MCG/5ML IJ SOLN
INTRAMUSCULAR | Status: DC | PRN
  Administered 2023-08-08: 100 ug via INTRAVENOUS
  Administered 2023-08-08 (×2): 50 ug via INTRAVENOUS

## 2023-08-08 MED ORDER — ONDANSETRON HCL 4 MG/2ML IJ SOLN: 4.0000 mg | Freq: Four times a day (QID) | INTRAMUSCULAR | Status: DC | PRN

## 2023-08-08 MED ORDER — OXYCODONE HCL 5 MG PO TABS
5.0000 mg | ORAL_TABLET | Freq: Once | ORAL | Status: AC
  Administered 2023-08-09: 5 mg via ORAL
  Filled 2023-08-08: qty 1

## 2023-08-08 MED ORDER — ACETAMINOPHEN 500 MG PO TABS
1000.0000 mg | ORAL_TABLET | Freq: Once | ORAL | Status: AC
  Administered 2023-08-08: 1000 mg via ORAL
  Filled 2023-08-08: qty 2

## 2023-08-08 MED ORDER — DEXAMETHASONE SODIUM PHOSPHATE 10 MG/ML IJ SOLN
INTRAMUSCULAR | Status: DC | PRN
  Administered 2023-08-08: 5 mg via INTRAVENOUS

## 2023-08-08 MED ORDER — SODIUM CHLORIDE 0.9 % IV SOLN: INTRAVENOUS | Status: DC

## 2023-08-08 MED ORDER — ROCURONIUM BROMIDE 10 MG/ML (PF) SYRINGE
PREFILLED_SYRINGE | INTRAVENOUS | Status: DC | PRN
  Administered 2023-08-08: 20 mg via INTRAVENOUS
  Administered 2023-08-08 (×2): 7.5 mg via INTRAVENOUS
  Administered 2023-08-08: 60 mg via INTRAVENOUS

## 2023-08-08 SURGICAL SUPPLY — 6 items
CABLE SURGICAL S-101-97-12 (CABLE) ×1 IMPLANT
ICD SUBCU MRI EMBLEM A219 (ICD Generator) IMPLANT
LEAD SUBQU EMBLEM 3501 (Pacemaker) IMPLANT
MAT PREVALON FULL STRYKER (MISCELLANEOUS) IMPLANT
PAD DEFIB RADIO PHYSIO CONN (PAD) ×1 IMPLANT
TRAY PACEMAKER INSERTION (PACKS) ×1 IMPLANT

## 2023-08-08 NOTE — Anesthesia Preprocedure Evaluation (Addendum)
 Anesthesia Evaluation  Patient identified by MRN, date of birth, ID band Patient awake    Reviewed: Allergy & Precautions, NPO status , Patient's Chart, lab work & pertinent test results, reviewed documented beta blocker date and time   History of Anesthesia Complications Negative for: history of anesthetic complications  Airway Mallampati: II  TM Distance: >3 FB Neck ROM: Full    Dental  (+) Poor Dentition, Chipped, Missing, Dental Advisory Given   Pulmonary asthma , COPD,  COPD inhaler, Recent URI , Residual Cough, Current Smoker and Patient abstained from smoking.   breath sounds clear to auscultation       Cardiovascular hypertension, Pt. on medications and Pt. on home beta blockers (-) angina + CAD, + Past MI and + CABG   Rhythm:Regular Rate:Normal  02/2023 ECHO: EF 30 to 35%. 1. The LV has moderately decreased function, global hypokinesis.  Grade I diastolic  dysfunction (impaired relaxation). The average left ventricular global longitudinal strain is -9.4 %.   2. RVF is normal. The right ventricular size is normal.   3. The mitral valve is normal in structure. Moderate mitral valve regurgitation. No evidence of mitral stenosis.   4. The aortic valve is normal in structure. Aortic valve regurgitation is not visualized. No aortic stenosis is present.     Neuro/Psych  Headaches PSYCHIATRIC DISORDERS (PTSD) Anxiety Depression Bipolar Disorder      GI/Hepatic ,GERD  Medicated and Controlled,,(+)     substance abuse  alcohol use, cocaine use and marijuana use  Endo/Other    Class 3 obesityBMI 39  Renal/GU negative Renal ROS     Musculoskeletal   Abdominal   Peds  Hematology Plavix  Hb 15.5, plt 242k   Anesthesia Other Findings   Reproductive/Obstetrics                             Anesthesia Physical Anesthesia Plan  ASA: 4  Anesthesia Plan: General   Post-op Pain Management: Tylenol   PO (pre-op)* and Minimal or no pain anticipated   Induction:   PONV Risk Score and Plan: 2 and Ondansetron  and Dexamethasone   Airway Management Planned: Oral ETT  Additional Equipment: None  Intra-op Plan:   Post-operative Plan: Extubation in OR  Informed Consent: I have reviewed the patients History and Physical, chart, labs and discussed the procedure including the risks, benefits and alternatives for the proposed anesthesia with the patient or authorized representative who has indicated his/her understanding and acceptance.     Dental advisory given  Plan Discussed with: CRNA and Surgeon  Anesthesia Plan Comments: (Pt upset that her wait for surgery has been lengthy, challenging to perform preop eval. )        Anesthesia Quick Evaluation

## 2023-08-08 NOTE — Progress Notes (Signed)
 Site with scant oozing Bps mildly soft pt anxious when staff in room

## 2023-08-08 NOTE — Anesthesia Postprocedure Evaluation (Signed)
 Anesthesia Post Note  Patient: Elizabeth Spencer  Procedure(s) Performed: SUBQ ICD IMPLANT     Patient location during evaluation: Cath Lab Anesthesia Type: General Level of consciousness: awake and alert, patient cooperative and oriented Pain management: pain level controlled (pain improving) Vital Signs Assessment: post-procedure vital signs reviewed and stable Respiratory status: nonlabored ventilation, spontaneous breathing and respiratory function stable Cardiovascular status: blood pressure returned to baseline and stable Postop Assessment: no apparent nausea or vomiting and adequate PO intake Anesthetic complications: no   There were no known notable events for this encounter.  Last Vitals:  Vitals:   08/08/23 1705 08/08/23 1710  BP: 109/77 108/80  Pulse: 84 78  Resp: 20 (!) 23  Temp:    SpO2: 95% 92%    Last Pain:  Vitals:   08/08/23 1717  TempSrc:   PainSc: 8                  Pernell Dikes,E. Angeleen Horney

## 2023-08-08 NOTE — H&P (Signed)
 Electrophysiology Note:   Date:  08/08/23 ID:  Elizabeth Spencer, DOB [birth year], MRN [id number]   Primary Cardiologist: Sammy Crisp, MD Electrophysiologist: [name], MD       History of Present Illness:   Elizabeth Spencer is a 38 y.o. 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   Patient presented to ED in May of 2024 with chest pain. Had LHC which showed hazy eccentric 90% ostial LAD stenosis concerning for acute plaque rupture, tubular 40% proximal RCA stenosis large circumflex without significant disease. EF noted to be 30-35%. She underwent single vessel CABG, LIMA-LAD on 09/02/22.   Discussed the use of AI scribe software for clinical note transcription with the patient, who gave verbal consent to proceed.   History of Present Illness   The patient, with a history of heart disease, including a heart catheterization and bypass surgery, presents with concerns about her heart health. The patient also mentions experiencing shortness of breath, chest discomfort, and fluid retention at times. She expresses concern about her quality of life and the potential for future heart-related complications. The patient also mentions a history of left upper extremity blood clot and had previously been on Eliquis . She is currently on aspirin  for blood thinning. She has recently stopped taking Eliquis . The patient also reports a recent weight gain, which she attributes to fluid retention. She denies any episodes of passing unless during heavy drinking.    Interval: Patient reports today for scheduled S-ICD implant. She had URI and cough a few weeks ago but completed antibiotic course. Otherwise no new or acute  complaints.  Review of systems complete and found to be negative unless listed in HPI.    EP Information / Studies Reviewed:     EKG is not ordered today. EKG from 05/25/23 reviewed which showed sinus tachycardia with PR and QRS 74ms.   Echo 02/24/23:  1. Left ventricular ejection fraction, by estimation, is 30 to 35%. The  left ventricle has moderately decreased function. The left ventricle  demonstrates global hypokinesis. Left ventricular diastolic parameters are  consistent with Grade I diastolic  dysfunction (impaired relaxation). The average left ventricular global  longitudinal strain is -9.4 %.   2. Right ventricular systolic function is normal. The right ventricular  size is normal. Tricuspid regurgitation signal is inadequate for assessing  PA pressure.   3. The mitral valve is normal in structure. Moderate mitral valve  regurgitation. No evidence of mitral stenosis.   4. The aortic valve is normal in structure. Aortic valve regurgitation is  not visualized. No aortic stenosis is present.   5. The inferior vena cava is normal in size with greater than 50%  respiratory variability, suggesting right atrial pressure of 3 mmHg.    Physical Exam:    Today's Vitals   08/08/23 1029 08/08/23 1042  BP: 105/63   Pulse: 97   Resp: (!) 22   Temp: 97.8 F (36.6 C)   TempSrc: Oral   SpO2: 96%   Weight: 112.5 kg   Height: 5\' 7"  (1.702 m)   PainSc:  0-No pain   Body mass index is 38.84 kg/m.  GEN: Well nourished, well developed in no acute distress NECK: No JVD CARDIAC: Normal rate, regular RESPIRATORY:  Clear to  auscultation without rales, wheezing or rhonchi  ABDOMEN: Soft, non-distended EXTREMITIES:  No edema; No deformity    ASSESSMENT AND PLAN:   Elizabeth Spencer is a 38 y.o. 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   #.  Chronic systolic heart failure: EF remains less than 35% greater than 90 days after revascularization despite optimal medical therapy. NYHA II symptoms. #.  Ischemic cardiomyopathy: -Patient meets criteria for primary prevention ICD. Explained risks, benefits, and alternatives to ICD implantation, including but not limited to bleeding, infection, damage to heart or lungs, heart attack, stroke, or death.  Pt verbalized understanding and agrees to proceed. We have opted for a subQ ICD due to her young age and history of drug use, in order to minimize long term risk of infection.  Signed, [name], MD

## 2023-08-08 NOTE — Transfer of Care (Signed)
 Immediate Anesthesia Transfer of Care Note  Patient: Elizabeth Spencer  Procedure(s) Performed: Leida Puna ICD IMPLANT  Patient Location: PACU and Cath Lab  Anesthesia Type:General  Level of Consciousness: awake, alert , and oriented  Airway & Oxygen Therapy: Patient Spontanous Breathing and Patient connected to face mask oxygen  Post-op Assessment: Report given to RN and Post -op Vital signs reviewed and stable  Post vital signs: Reviewed and stable  Last Vitals:  Vitals Value Taken Time  BP 116/93 08/08/23 1645  Temp    Pulse 138 08/08/23 1648  Resp 19 08/08/23 1648  SpO2 95 % 08/08/23 1648  Vitals shown include unfiled device data.  Last Pain:  Vitals:   08/08/23 1042  TempSrc:   PainSc: 0-No pain         Complications: There were no known notable events for this encounter.

## 2023-08-08 NOTE — Anesthesia Procedure Notes (Signed)
 Procedure Name: Intubation Date/Time: 08/08/2023 2:00 PM  Performed by: Candance Certain, CRNAPre-anesthesia Checklist: Patient identified, Emergency Drugs available, Suction available and Patient being monitored Patient Re-evaluated:Patient Re-evaluated prior to induction Oxygen Delivery Method: Circle System Utilized Preoxygenation: Pre-oxygenation with 100% oxygen Induction Type: IV induction Ventilation: Mask ventilation without difficulty Laryngoscope Size: Mac and 3 Grade View: Grade I Tube type: Oral Tube size: 7.0 mm Number of attempts: 1 Airway Equipment and Method: Stylet and Oral airway Placement Confirmation: ETT inserted through vocal cords under direct vision, positive ETCO2 and breath sounds checked- equal and bilateral Secured at: 21 cm Tube secured with: Tape Dental Injury: Teeth and Oropharynx as per pre-operative assessment  Comments: Atraumatic induction/intubation. Patient verbalized loose R lower molar. CRNA avoided this area with scissoring during intubation. Dentition and oral mucosa as per preop.

## 2023-08-09 ENCOUNTER — Ambulatory Visit (HOSPITAL_COMMUNITY): Payer: MEDICAID

## 2023-08-09 ENCOUNTER — Encounter (HOSPITAL_COMMUNITY)
Admission: RE | Disposition: A | Discharge status: Home / Self Care | Payer: MEDICAID | Source: Home / Self Care | Attending: Cardiology

## 2023-08-09 ENCOUNTER — Other Ambulatory Visit (HOSPITAL_COMMUNITY): Payer: Self-pay

## 2023-08-09 ENCOUNTER — Ambulatory Visit (HOSPITAL_BASED_OUTPATIENT_CLINIC_OR_DEPARTMENT_OTHER): Payer: MEDICAID | Admitting: Certified Registered Nurse Anesthetist

## 2023-08-09 ENCOUNTER — Ambulatory Visit (HOSPITAL_COMMUNITY): Payer: MEDICAID | Admitting: Certified Registered Nurse Anesthetist

## 2023-08-09 DIAGNOSIS — J449 Chronic obstructive pulmonary disease, unspecified: Secondary | ICD-10-CM

## 2023-08-09 DIAGNOSIS — I5022 Chronic systolic (congestive) heart failure: Secondary | ICD-10-CM | POA: Diagnosis not present

## 2023-08-09 DIAGNOSIS — I110 Hypertensive heart disease with heart failure: Secondary | ICD-10-CM | POA: Diagnosis not present

## 2023-08-09 DIAGNOSIS — G8929 Other chronic pain: Secondary | ICD-10-CM | POA: Diagnosis not present

## 2023-08-09 DIAGNOSIS — T82120A Displacement of cardiac electrode, initial encounter: Secondary | ICD-10-CM

## 2023-08-09 DIAGNOSIS — I2510 Atherosclerotic heart disease of native coronary artery without angina pectoris: Secondary | ICD-10-CM

## 2023-08-09 DIAGNOSIS — I5020 Unspecified systolic (congestive) heart failure: Secondary | ICD-10-CM

## 2023-08-09 DIAGNOSIS — I255 Ischemic cardiomyopathy: Secondary | ICD-10-CM | POA: Diagnosis not present

## 2023-08-09 HISTORY — PX: SUBQ ICD REVISION: EP1224

## 2023-08-09 SURGERY — SUBQ ICD REVISION
Anesthesia: General

## 2023-08-09 MED ORDER — SODIUM CHLORIDE 0.9% FLUSH: 3.0000 mL | INTRAVENOUS | Status: DC | PRN

## 2023-08-09 MED ORDER — HYDROMORPHONE HCL 1 MG/ML IJ SOLN
INTRAMUSCULAR | Status: AC
  Filled 2023-08-09: qty 0.5

## 2023-08-09 MED ORDER — OXYCODONE HCL 5 MG PO TABS: 5.0000 mg | ORAL_TABLET | Freq: Once | ORAL | Status: DC | PRN

## 2023-08-09 MED ORDER — SODIUM CHLORIDE 0.9 % IV SOLN: INTRAVENOUS | Status: DC | PRN

## 2023-08-09 MED ORDER — OXYCODONE HCL 5 MG/5ML PO SOLN: 5.0000 mg | Freq: Once | ORAL | Status: DC | PRN

## 2023-08-09 MED ORDER — LIDOCAINE 2% (20 MG/ML) 5 ML SYRINGE
INTRAMUSCULAR | Status: DC | PRN
Start: 2023-08-09 — End: 2023-08-09
  Administered 2023-08-09: 40 mg via INTRAVENOUS

## 2023-08-09 MED ORDER — ONDANSETRON HCL 4 MG/2ML IJ SOLN: 4.0000 mg | Freq: Once | INTRAMUSCULAR | Status: DC | PRN

## 2023-08-09 MED ORDER — ALPRAZOLAM 0.5 MG PO TABS
0.5000 mg | ORAL_TABLET | Freq: Two times a day (BID) | ORAL | Status: DC
  Administered 2023-08-09: 0.5 mg via ORAL
  Filled 2023-08-09: qty 1

## 2023-08-09 MED ORDER — FENTANYL CITRATE (PF) 250 MCG/5ML IJ SOLN
INTRAMUSCULAR | Status: DC | PRN
  Administered 2023-08-09: 100 ug via INTRAVENOUS

## 2023-08-09 MED ORDER — OXYCODONE HCL 5 MG PO TABS
5.0000 mg | ORAL_TABLET | Freq: Four times a day (QID) | ORAL | 0 refills | Status: DC | PRN
Start: 2023-08-09 — End: 2023-09-01
  Filled 2023-08-09: qty 15, 4d supply, fill #0

## 2023-08-09 MED ORDER — SODIUM CHLORIDE 0.9 % IV SOLN
INTRAVENOUS | Status: AC
  Filled 2023-08-09: qty 2

## 2023-08-09 MED ORDER — ALPRAZOLAM 0.5 MG PO TABS
0.5000 mg | ORAL_TABLET | Freq: Once | ORAL | Status: AC | PRN
  Administered 2023-08-09: 0.5 mg via ORAL
  Filled 2023-08-09: qty 1

## 2023-08-09 MED ORDER — CHLORHEXIDINE GLUCONATE 4 % EX SOLN: 60.0000 mL | Freq: Once | CUTANEOUS | Status: DC

## 2023-08-09 MED ORDER — BUPIVACAINE HCL (PF) 0.25 % IJ SOLN
INTRAMUSCULAR | Status: DC | PRN
  Administered 2023-08-09: 50 mL

## 2023-08-09 MED ORDER — MIDAZOLAM HCL 2 MG/2ML IJ SOLN
INTRAMUSCULAR | Status: AC
  Filled 2023-08-09: qty 2

## 2023-08-09 MED ORDER — SODIUM CHLORIDE 0.9% FLUSH: 3.0000 mL | Freq: Two times a day (BID) | INTRAVENOUS | Status: DC

## 2023-08-09 MED ORDER — ACETAMINOPHEN 10 MG/ML IV SOLN: 1000.0000 mg | Freq: Once | INTRAVENOUS | Status: DC | PRN

## 2023-08-09 MED ORDER — FENTANYL CITRATE (PF) 100 MCG/2ML IJ SOLN
INTRAMUSCULAR | Status: AC
  Filled 2023-08-09: qty 2

## 2023-08-09 MED ORDER — BUPIVACAINE HCL (PF) 0.25 % IJ SOLN
INTRAMUSCULAR | Status: AC
  Filled 2023-08-09: qty 60

## 2023-08-09 MED ORDER — SODIUM CHLORIDE 0.9% FLUSH
3.0000 mL | Freq: Two times a day (BID) | INTRAVENOUS | Status: DC
Start: 2023-08-09 — End: 2023-08-09

## 2023-08-09 MED ORDER — HYDROMORPHONE HCL 1 MG/ML IJ SOLN
INTRAMUSCULAR | Status: DC | PRN
  Administered 2023-08-09: .5 mg via INTRAVENOUS

## 2023-08-09 MED ORDER — ROCURONIUM BROMIDE 10 MG/ML (PF) SYRINGE
PREFILLED_SYRINGE | INTRAVENOUS | Status: DC | PRN
Start: 2023-08-09 — End: 2023-08-09
  Administered 2023-08-09: 40 mg via INTRAVENOUS
  Administered 2023-08-09 (×2): 10 mg via INTRAVENOUS

## 2023-08-09 MED ORDER — SUGAMMADEX SODIUM 200 MG/2ML IV SOLN
INTRAVENOUS | Status: DC | PRN
  Administered 2023-08-09: 200 mg via INTRAVENOUS

## 2023-08-09 MED ORDER — FENTANYL CITRATE (PF) 100 MCG/2ML IJ SOLN: 25.0000 ug | INTRAMUSCULAR | Status: DC | PRN

## 2023-08-09 MED ORDER — MIDAZOLAM HCL 2 MG/2ML IJ SOLN
INTRAMUSCULAR | Status: DC | PRN
  Administered 2023-08-09: 2 mg via INTRAVENOUS

## 2023-08-09 MED ORDER — PROPOFOL 10 MG/ML IV BOLUS
INTRAVENOUS | Status: DC | PRN
  Administered 2023-08-09: 110 mg via INTRAVENOUS

## 2023-08-09 MED ORDER — VANCOMYCIN HCL 1500 MG/300ML IV SOLN
1500.0000 mg | INTRAVENOUS | Status: DC
  Administered 2023-08-09: 1500 mg via INTRAVENOUS
  Filled 2023-08-09: qty 300

## 2023-08-09 MED ORDER — OXYCODONE HCL 5 MG PO TABS
5.0000 mg | ORAL_TABLET | Freq: Four times a day (QID) | ORAL | Status: DC | PRN
  Administered 2023-08-09: 5 mg via ORAL
  Filled 2023-08-09: qty 1

## 2023-08-09 MED ORDER — DEXAMETHASONE SODIUM PHOSPHATE 10 MG/ML IJ SOLN
INTRAMUSCULAR | Status: DC | PRN
Start: 2023-08-09 — End: 2023-08-09
  Administered 2023-08-09: 5 mg via INTRAVENOUS

## 2023-08-09 MED ORDER — PHENYLEPHRINE 80 MCG/ML (10ML) SYRINGE FOR IV PUSH (FOR BLOOD PRESSURE SUPPORT)
PREFILLED_SYRINGE | INTRAVENOUS | Status: DC | PRN
  Administered 2023-08-09: 160 ug via INTRAVENOUS
  Administered 2023-08-09: 80 ug via INTRAVENOUS
  Administered 2023-08-09: 10 ug via INTRAVENOUS
  Administered 2023-08-09: 80 ug via INTRAVENOUS
  Administered 2023-08-09 (×2): 160 ug via INTRAVENOUS
  Administered 2023-08-09 (×2): 10 ug via INTRAVENOUS
  Administered 2023-08-09: 160 ug via INTRAVENOUS
  Administered 2023-08-09: 10 ug via INTRAVENOUS

## 2023-08-09 MED ORDER — OXYCODONE HCL 5 MG PO TABS
5.0000 mg | ORAL_TABLET | Freq: Once | ORAL | Status: AC | PRN
  Administered 2023-08-09: 5 mg via ORAL
  Filled 2023-08-09: qty 1

## 2023-08-09 MED ORDER — SODIUM CHLORIDE 0.9 % IV SOLN
80.0000 mg | INTRAVENOUS | Status: AC
  Administered 2023-08-09: 80 mg
  Filled 2023-08-09: qty 2

## 2023-08-09 MED ORDER — ONDANSETRON HCL 4 MG/2ML IJ SOLN
INTRAMUSCULAR | Status: DC | PRN
  Administered 2023-08-09: 4 mg via INTRAVENOUS

## 2023-08-09 SURGICAL SUPPLY — 4 items
CABLE SURGICAL S-101-97-12 (CABLE) ×1 IMPLANT
PAD DEFIB RADIO PHYSIO CONN (PAD) ×1 IMPLANT
SYR CONTROL 10ML ANGIOGRAPHIC (SYRINGE) IMPLANT
TRAY PACEMAKER INSERTION (PACKS) ×1 IMPLANT

## 2023-08-09 NOTE — Interval H&P Note (Signed)
 History and Physical Interval Note:  08/09/2023 11:48 AM  Rance Burrows  has presented today for surgery, with the diagnosis of ICD lead dislodgement.  The various methods of treatment have been discussed with the patient and family. After consideration of risks, benefits and other options for treatment, the patient has consented to  Procedure(s): SUBQ ICD REVISION (N/A) as a surgical intervention.  The patient's history has been reviewed, patient examined, no change in status, stable for surgery.  I have reviewed the patient's chart and labs.  Questions were answered to the patient's satisfaction.     [name]

## 2023-08-09 NOTE — Plan of Care (Signed)

## 2023-08-09 NOTE — Anesthesia Preprocedure Evaluation (Addendum)
 Anesthesia Evaluation  Patient identified by MRN, date of birth, ID band Patient awake    Reviewed: Allergy & Precautions, NPO status , Patient's Chart, lab work & pertinent test results, reviewed documented beta blocker date and time   History of Anesthesia Complications (+) history of anesthetic complications  Airway Mallampati: II  TM Distance: >3 FB Neck ROM: Full    Dental  (+) Poor Dentition, Chipped, Missing, Dental Advisory Given   Pulmonary asthma , sleep apnea , COPD,  COPD inhaler, Recent URI , Residual Cough, Current Smoker and Patient abstained from smoking.   breath sounds clear to auscultation       Cardiovascular hypertension, Pt. on medications and Pt. on home beta blockers (-) angina + CAD, + Past MI and + CABG   Rhythm:Regular Rate:Normal  2024 Echo  1. Left ventricular ejection fraction, by estimation, is 30 to 35%. The  left ventricle has moderately decreased function. The left ventricle  demonstrates global hypokinesis. Left ventricular diastolic parameters are  consistent with Grade I diastolic  dysfunction (impaired relaxation). The average left ventricular global  longitudinal strain is -9.4 %.   2. Right ventricular systolic function is normal. The right ventricular  size is normal. Tricuspid regurgitation signal is inadequate for assessing  PA pressure.   3. The mitral valve is normal in structure. Moderate mitral valve  regurgitation. No evidence of mitral stenosis.   4. The aortic valve is normal in structure. Aortic valve regurgitation is  not visualized. No aortic stenosis is present.   5. The inferior vena cava is normal in size with greater than 50%  respiratory variability, suggesting right atrial pressure of 3 mmHg.      Neuro/Psych  Headaches PSYCHIATRIC DISORDERS (PTSD) Anxiety Depression Bipolar Disorder      GI/Hepatic ,GERD  Medicated and Controlled,,(+)     substance abuse   alcohol use, cocaine use and marijuana use  Endo/Other    Class 3 obesityBMI 39  Renal/GU negative Renal ROSLab Results      Component                Value               Date                     K                        3.8                 07/25/2023                CO2                      16 (L)              07/25/2023                BUN                      8                   07/25/2023                CREATININE               0.75                07/25/2023  Musculoskeletal  (+) Arthritis ,    Abdominal   Peds  Hematology Lab Results      Component                Value               Date                      WBC                      7.5                 07/25/2023                HGB                      15.5                07/25/2023                HCT                      48.5 (H)            07/25/2023                MCV                      90                  07/25/2023                PLT                      242                 07/25/2023                    Anesthesia Other Findings   Reproductive/Obstetrics                             Anesthesia Physical Anesthesia Plan  ASA: 4  Anesthesia Plan: General   Post-op Pain Management: Tylenol  PO (pre-op)* and Minimal or no pain anticipated   Induction:   PONV Risk Score and Plan: 2 and Ondansetron  and Dexamethasone   Airway Management Planned: Oral ETT  Additional Equipment: None  Intra-op Plan:   Post-operative Plan: Extubation in OR  Informed Consent: I have reviewed the patients History and Physical, chart, labs and discussed the procedure including the risks, benefits and alternatives for the proposed anesthesia with the patient or authorized representative who has indicated his/her understanding and acceptance.     Dental advisory given  Plan Discussed with: CRNA and Surgeon  Anesthesia Plan Comments:         Anesthesia Quick Evaluation

## 2023-08-09 NOTE — Discharge Summary (Addendum)
 ELECTROPHYSIOLOGY PROCEDURE DISCHARGE SUMMARY    Patient ID: Elizabeth Spencer,  MRN: [id number], DOB/AGE: [birth year] 38 y.o.  Admit date: 08/08/2023 Discharge date: 08/09/2023  Primary Care Physician: Trenda Frisk, FNP  Primary Cardiologist: Sammy Crisp, MD  Electrophysiologist: Dr. Daneil Dunker    Primary Diagnosis:  Chronic systolic CHF s/p S-ICD placement with Lead Revision   Secondary Diagnosis: ICM Chronic Pain  Bipolar Disorder  PTSD Hx of Polysubstance Abuse  GERD Pre-Diabetes  GERD Asthma    Allergies  Allergen Reactions   Azithromycin Swelling and Other (See Comments)   Clindamycin  Swelling   Codeine Hives and Other (See Comments)    Nausea/dizzy    Meloxicam  Other (See Comments)    Burns up from the inside    Neurontin [Gabapentin] Other (See Comments)    Makes her pass out and not remember what happened prior to taking med.   Penicillins Anaphylaxis    Patient allergic to all "cillins" Has patient had a PCN reaction causing immediate rash, facial/tongue/throat swelling, SOB or lightheadedness with hypotension: Yes Has patient had a PCN reaction causing severe rash involving mucus membranes or skin necrosis: Yes Has patient had a PCN reaction that required hospitalization: No Has patient had a PCN reaction occurring within the last 10 years: Yes If all of the above answers are "NO", then may proceed with Cephalosporin use.    Clindamycin /Lincomycin Swelling   Other Hives and Other (See Comments)    Berry flavored food and drinks   Diphenhydramine  Hcl Rash and Other (See Comments)   Doxycycline Rash   Egg-Derived Products Rash     Procedures This Admission:  1.  Implantation of a Architect on 08/08/23 by Dr. Daneil Dunker with lead revision on 08/09/23.  The patient received a Interior and spatial designer S-ICD A219 with [employer] Emblem 3501 (Subcutaneous) lead. DFTs were deferred at time of implant (successful with single 65j shock on  5/5//25)   CXR on 08/09/23 am showed initial lead had migrated down and was malpositioned. 2.  CXR on 08/09/23 demonstrated  CXR reviewed by Dr. Daneil Dunker post lead revision and in good position.       Brief HPI: Elizabeth Spencer is a 38 y.o. female  with PMH of bipolar disorder, former polysubstance / ETOH abuse, CAD s/p CABG x1, ICM, HFrEF, HTN, HLD, chronic low back pain, GERD who was referred to electrophysiology in the outpatient setting for consideration of ICD implantation.  Past medical history includes above.  The patient has persistent LV dysfunction despite guideline directed therapy.  Risks, benefits, and alternatives to ICD implantation were reviewed with the patient who wished to proceed.   Hospital Course:  The patient was admitted and underwent implantation of a [employer]  subcutaneous ICD  with details as outlined above. They were monitored on telemetry overnight which demonstrated NSR .  Left chest was without hematoma or ecchymosis.  The device was interrogated and found to be functioning normally.  CXR was obtained and demonstrated  appropriate placement of S-ICD lead without complication .  BP was soft on home Toprol  and patient was instructed to half medication 12.5 mg daily until seen by heart failure clinic. Wound care, arm mobility, and restrictions were reviewed with the patient. The patient was examined and considered stable for discharge to home.   The patient's discharge medications include metoprolol  XL, spironolactone , farxiga , lasix .  BP limits further GDMT.   Anticoagulation resumption This patient is not on anticoagulation.  Physical Exam: Vitals:  08/09/23 1420 08/09/23 1425 08/09/23 1430 08/09/23 1435  BP: 105/76 98/66 105/60   Pulse: (!) 105 (!) 105 100 96  Resp: (!) 25 15 (!) 23 18  Temp:      TempSrc:      SpO2: 94% 95% 94% 94%  Weight:      Height:        GEN- NAD. A&O x 3.  HEENT: Normocephalic, atraumatic, fair dentition Lungs- CTAB, normal  effort.  Heart- RRR. No M/G/R.  GI- Soft, NT, ND.  Extremities- No clubbing, cyanosis, or edema Skin- Warm and dry, no rash or lesion. ICD sites clean / dry, dressing intact  Discharge Medications:  Allergies as of 08/09/2023       Reactions   Azithromycin Swelling, Other (See Comments)   Clindamycin  Swelling   Codeine Hives, Other (See Comments)   Nausea/dizzy   Meloxicam  Other (See Comments)   Burns up from the inside   Neurontin [gabapentin] Other (See Comments)   Makes her pass out and not remember what happened prior to taking med.   Penicillins Anaphylaxis   Patient allergic to all "cillins" Has patient had a PCN reaction causing immediate rash, facial/tongue/throat swelling, SOB or lightheadedness with hypotension: Yes Has patient had a PCN reaction causing severe rash involving mucus membranes or skin necrosis: Yes Has patient had a PCN reaction that required hospitalization: No Has patient had a PCN reaction occurring within the last 10 years: Yes If all of the above answers are "NO", then may proceed with Cephalosporin use.   Clindamycin /lincomycin Swelling   Other Hives, Other (See Comments)   Berry flavored food and drinks   Diphenhydramine  Hcl Rash, Other (See Comments)   Doxycycline Rash   Egg-derived Products Rash        Medication List     STOP taking these medications    HYDROcodone -acetaminophen  5-325 MG tablet Commonly known as: NORCO/VICODIN       TAKE these medications    ALPRAZolam  XR 0.5 MG 24 hr tablet Generic drug: ALPRAZolam  TAKE 1 TABLET BY MOUTH TWICE DAILY AS NEEDED FOR ANXIETY OR SLEEP What changed: See the new instructions.   aspirin  81 MG chewable tablet Chew 1 tablet (81 mg total) by mouth daily.   atorvastatin  40 MG tablet Commonly known as: LIPITOR Take 1 tablet (40 mg total) by mouth daily.   benzonatate  100 MG capsule Commonly known as: Tessalon  Perles Take 1 capsule (100 mg total) by mouth 3 (three) times daily as  needed for cough.   cetirizine  10 MG tablet Commonly known as: ZYRTEC  Take 1 tablet (10 mg total) by mouth daily. What changed: when to take this   clopidogrel  75 MG tablet Commonly known as: PLAVIX  Take 1 tablet (75 mg total) by mouth daily.   dapagliflozin  propanediol 10 MG Tabs tablet Commonly known as: Farxiga  Take 1 tablet (10 mg total) by mouth daily before breakfast.   diphenhydrAMINE  25 MG tablet Commonly known as: BENADRYL  Take 50 mg by mouth every 6 (six) hours as needed (allergic reaction). Dye and glutin free   EpiPen  2-Pak 0.3 MG/0.3ML Soaj injection Generic drug: EPINEPHrine  INJECT INTO THIGH MUSCLE THROUGH CLOTHES AS NEEDED SEVERE ALLERGIC REACTION   fluticasone  50 MCG/ACT nasal spray Commonly known as: FLONASE  1 spray by Each Nare route daily.   furosemide  20 MG tablet Commonly known as: LASIX  Take 1 tablet (20 mg total) by mouth daily as needed. What changed: reasons to take this   HAIR SKIN & NAILS GUMMIES PO Take  1 tablet by mouth daily. Biotin   hydrOXYzine  25 MG tablet Commonly known as: ATARAX  Take 25 mg by mouth 3 (three) times daily as needed.   methimazole  10 MG tablet Commonly known as: TAPAZOLE  Take 1 tablet (10 mg total) by mouth daily. What changed: how much to take   metoprolol  succinate 25 MG 24 hr tablet Commonly known as: Toprol  XL Take 1 tablet (25 mg total) by mouth daily. Notes to patient: Reduce to 1/2 tablet until seen in Heart Failure Clinic.  If systolic BP (top number) less than 90, hold dose   montelukast  10 MG tablet Commonly known as: SINGULAIR  Take 1 tablet (10 mg total) by mouth at bedtime. What changed: when to take this   omeprazole  20 MG capsule Commonly known as: PRILOSEC Take 1 capsule (20 mg total) by mouth 2 (two) times daily.   ondansetron  4 MG disintegrating tablet Commonly known as: ZOFRAN -ODT Take 1 tablet (4 mg total) by mouth every 8 (eight) hours as needed for nausea or vomiting.   oxyCODONE  5 MG  immediate release tablet Commonly known as: Oxy IR/ROXICODONE  Take 1 tablet (5 mg total) by mouth every 6 (six) hours as needed for moderate pain (pain score 4-6) or severe pain (pain score 7-10).   spironolactone  25 MG tablet Commonly known as: ALDACTONE  Take 1 tablet (25 mg total) by mouth daily.               Discharge Care Instructions  (From admission, onward)           Start     Ordered   08/09/23 0000  Discharge wound care:       Comments: Keep outer dressings in place for 24 hours.  Then remove outer dressing only.  Follow instructions attached. Keeps sites clean and dry.   08/09/23 1611            Disposition: Home with usual follow up as in AVS  Signed, Creighton Doffing, NP-C, AGACNP-BC [facility] - Electrophysiology  08/09/2023, 5:19 PM   I have seen, examined the patient, and reviewed the above assessment and plan.    Hospital Course: Patient presented on 5/5 for S-ICD implant.  Device was implanted using a 2 incision technique and patient had successful DFTs with single 65 J shock.  She was kept overnight for monitoring.  Unfortunately, on her two-view chest x-ray the next morning the distal end of the lead had migrated, likely due to being too superficial in the soft tissue and displacement from breast tissue when upright.  The configuration of the lead was likely also causing some discomfort.  The decision was made to revise the subcutaneous lead.  She was taken back to the lab on 08/09/2023 and the subcutaneous lead was repositioned over the ribs in the left parasternal area and secured to the fascia on both ends.  The patient tolerated the procedure well.  Post implant impedance was 60 ohms.  Patient had two-view chest x-ray which showed the generator against the rib in the midaxillary position and the lead resting along the parasternal area.  Pain was adequately controlled and the patient was discharged home after 4 hours of monitoring.  Plan:  -  Patient will have wound check in approximately 2 weeks.  She will continue follow-up with heart failure.  We will enroll her in remote monitoring.  Duration of Discharge Encounter: 60 minutes  [name], MD 08/09/2023 8:06 PM

## 2023-08-09 NOTE — Anesthesia Postprocedure Evaluation (Signed)
 Anesthesia Post Note  Patient: Elizabeth Spencer  Procedure(s) Performed: SUBQ ICD REVISION     Patient location during evaluation: PACU Anesthesia Type: General Level of consciousness: awake and alert Pain management: pain level controlled Vital Signs Assessment: post-procedure vital signs reviewed and stable Respiratory status: spontaneous breathing, nonlabored ventilation, respiratory function stable and patient connected to nasal cannula oxygen Cardiovascular status: blood pressure returned to baseline and stable Postop Assessment: no apparent nausea or vomiting Anesthetic complications: no  There were no known notable events for this encounter.  Last Vitals:  Vitals:   08/09/23 1430 08/09/23 1435  BP: 105/60   Pulse: 100 96  Resp: (!) 23 18  Temp:    SpO2: 94% 94%    Last Pain:  Vitals:   08/09/23 0830  TempSrc:   PainSc: 10-Worst pain ever   Pain Goal: Patients Stated Pain Goal: 0 (08/09/23 0745)                 Willian Harrow

## 2023-08-09 NOTE — Progress Notes (Signed)
 Patient A/Ox4 and stable at time of transport to cath lab. Cath lab report called to receiving RN. Vital signs not obtained this morning d/t patient refusal. Cath lab aware. CHG wipes done lightly to chest (pt complaining of pain); cath lab aware CHG wipes will need to be done again after patient is put to sleep d/t MD request.

## 2023-08-09 NOTE — Discharge Instructions (Signed)
Subcutaneous Cardioverter Defibrillator Implantation, Care After  After Your ICD (Implantable Cardiac Defibrillator)   You have a Environmental manager Subcutaneous ICD  ACTIVITY  Do not lift, push, pull, or carry anything over 10 pounds with the until you are instructed it is safe to do so by your provider.   You may drive AFTER your wound check, unless you have been told otherwise by your provider.   Ask your healthcare provider when you can go back to work and resume heavy lifting or exertion.    INCISION/Dressing  If large square, outer bandage is left in place, this can be removed after 24 hours from your procedure. Do not remove steri-strips or glue as below.   Monitor your defibrillator sites for redness, swelling, and drainage. Call the device clinic at [phone] if you experience these symptoms or fever/chills.  If your incision is sealed with Steri-strips or staples, you may shower 7 days after your procedure or when told by your provider. Do not remove the steri-strips or let the shower hit directly on your site. You may wash around your site with soap and water.    If you were discharged in a sling, please do not wear this during the day more than 48 hours after your surgery unless otherwise instructed. This may increase the risk of stiffness and soreness in your shoulder.   Avoid lotions, ointments, or perfumes over your incision until it is well-healed.  You may use a hot tub or a pool AFTER your wound check appointment if the incision is completely closed.  Your ICD is designed to protect you from life threatening heart rhythms. Because of this, you may receive a shock.   1 shock with no symptoms:  Call the office during business hours. 1 shock with symptoms (chest pain, chest pressure, dizziness, lightheadedness, shortness of breath, overall feeling unwell):  Call 911. If you experience 2 or more shocks in 24 hours:  Call 911. If you receive a shock, you should  not drive for 6 months per the [place] DMV IF you receive appropriate therapy from your ICD.   ICD Alerts:  Some alerts are vibratory and others beep. These are NOT emergencies. Please call our office to let us know. If this occurs at night or on weekends, it can wait until the next business day. Send a remote transmission.  DEVICE MANAGEMENT Remote monitoring is used to monitor your ICD from home. This monitoring is scheduled every 91 days by our office. It allows [place] to keep an eye on the functioning of your device to ensure it is working properly. You will routinely see your Electrophysiologist annually (more often if necessary).   You should receive your ID card for your new device in 4-8 weeks. Keep this card with you at all times once received. Consider wearing a medical alert bracelet or necklace.  Your ICD may be MRI compatible. This will be discussed at your next office visit/wound check.  You should avoid contact with strong electric or magnetic fields.   Do not use amateur (ham) radio equipment or electric (arc) welding torches. MP3 player headphones with magnets should not be used. Some devices are safe to use if held at least 12 inches (30 cm) from your defibrillator. These include power tools, lawn mowers, and speakers. If you are unsure if something is safe to use, ask your health care provider.  When using your cell phone, hold it to the ear that is on the opposite side from the defibrillator.  Do not leave your cell phone in a pocket over the defibrillator.  You may safely use electric blankets, heating pads, computers, and microwave ovens.  Call the office right away if: You have chest pain. You feel more than one shock. You feel more short of breath than you have felt before. You feel more light-headed than you have felt before. Your incision starts to open up.  This information is not intended to replace advice given to you by your health care provider. Make sure you discuss any  questions you have with your health care provider.

## 2023-08-09 NOTE — Progress Notes (Addendum)
 Patient Name: Elizabeth Spencer Date of Encounter: 08/09/2023  Primary Cardiologist: Sammy Crisp, MD Electrophysiologist: [name], MD  Interval Summary   The patient is frustrated about pain control, anxiety.  She reports she is stressed out, upset with staff.  States "she should slap the f&%&%$^ nurse". She is upset with the overnight covering MD for not emailing Dr. Daneil Dunker.  Frustrated that we have her on blood pressure medication and this is preventing her from getting pain medication. Asking for more pain medication and anxiety meds.  States she wants to go home.   Vital Signs    Vitals:   08/08/23 2300 08/08/23 2315 08/09/23 0100 08/09/23 0523  BP: (!) 88/63  114/63 (!) 100/57  Pulse: 98 93 83 92  Resp: 19 16 15 20   Temp:    97.7 F (36.5 C)  TempSrc:    Oral  SpO2: 96% 97% 97% 97%  Weight:    118.2 kg  Height:        Intake/Output Summary (Last 24 hours) at 08/09/2023 0950 Last data filed at 08/09/2023 0800 Gross per 24 hour  Intake 1110 ml  Output 15 ml  Net 1095 ml   Filed Weights   08/08/23 1029 08/09/23 0523  Weight: 112.5 kg 118.2 kg    Physical Exam    GEN- agitated / upset, in no physical acute distress   Lungs- Clear to ausculation bilaterally, normal work of breathing Cardiac- Regular rate and rhythm, no murmurs, rubs or gallops, surgical sites clean/dry, no significant edema GI- soft, NT, ND, + BS Extremities- no clubbing or cyanosis. No edema  Telemetry    SR 70-90's (personally reviewed)  Hospital Course    Elizabeth Spencer is a 38 y.o. female with PMH of bipolar disorder, former polysubstance / ETOH abuse, CAD s/p CABG x1, ICM, HFrEF, HTN, HLD, chronic low back pain, GERD who was seen in the outpatient setting and scheduled for S-ICD placement per Dr. Daneil Dunker.  The patient underwent S-ICD placement on 08/08/23.  Subsequent CXR on 08/09/23 showed lead had dislodged / migrated.  She was planned for lead revision.  The patient refused to have another  physician perform the revision.  Hospital course complicated by pain control issues. She is planned for lead revision on 08/09/23 per Dr. Daneil Dunker.   Assessment & Plan    Chronic Systolic HF due to ICM  LVEF <35% post revascularization, s/p S-ICD 08/08/23 with migrated / dislodged lead post procedure.  -NPO  -plan for lead revision per Dr. Daneil Dunker  -reassurance offered to patient, she refused another physician to perform lead revision  -pain control as below   Chronic Pain  Hx Polysubstance Abuse  Bipolar Disorder  -continue PRN oxycodone  for pain, BP has been limiting factor  -continue home Xanax  but on scheduled basis given situational anxiety      For questions or updates, please contact [facility] Please consult [email or web address] for contact info under Cardiology/STEMI.  Signed, Creighton Doffing, NP-C, AGACNP-BC [facility] - Electrophysiology  08/09/2023, 9:58 AM  I have seen, examined the patient, and reviewed the above assessment and plan.    Interval:  Some pain overnight otherwise no acute events. Upset and frustrated with pain management.   General: Well developed, in no acute distress.  Neck: No JVD.  Cardiac: Normal rate, regular rhythm. Left midaxillary incision and subxiphoid incisions without bleeding or hematoma.  Resp: Normal work of breathing.  Ext: No edema.  Neuro: No gross focal deficits.  Psych: Normal affect.  Assessment:  Elizabeth Spencer is a 38 y.o. 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   #.  Chronic systolic heart failure: EF remains less than 35% greater than 90 days after revascularization despite optimal medical therapy. NYHA II symptoms. #.  Ischemic  cardiomyopathy:  Plan:  - CXR this morning with dislodgement of the distal tip of the lead. Likely positioned in superficial fat and displaced by breast tissue with being upright. Impedance decreased to 85 ohms today. Can in good position. Will bring back to lab today to revise S-ICD lead. Will tie distal end of lead down to prevent future recurrences. Explained risks, benefits, and alternatives to ICD implantation, including but not limited to bleeding, infection, damage to heart or lungs, heart attack, stroke, or death.  Pt verbalized understanding and agrees to proceed if indicated.    [name], MD 08/09/2023 11:43 AM

## 2023-08-09 NOTE — Anesthesia Procedure Notes (Signed)
 Procedure Name: Intubation Date/Time: 08/09/2023 12:38 PM  Performed by: Jamas Maywood, CRNAPre-anesthesia Checklist: Patient identified, Emergency Drugs available, Suction available and Patient being monitored Patient Re-evaluated:Patient Re-evaluated prior to induction Oxygen Delivery Method: Circle system utilized Preoxygenation: Pre-oxygenation with 100% oxygen Induction Type: IV induction Ventilation: Mask ventilation without difficulty Laryngoscope Size: Mac and 4 Grade View: Grade I Tube type: Oral Tube size: 7.0 mm Number of attempts: 1 Airway Equipment and Method: Stylet and Oral airway Placement Confirmation: ETT inserted through vocal cords under direct vision, positive ETCO2 and breath sounds checked- equal and bilateral Secured at: 23 cm Tube secured with: Tape Dental Injury: Teeth and Oropharynx as per pre-operative assessment

## 2023-08-09 NOTE — Transfer of Care (Signed)
 Immediate Anesthesia Transfer of Care Note  Patient: Elizabeth Spencer  Procedure(s) Performed: Leida Puna ICD REVISION  Patient Location: PACU and Cath Lab  Anesthesia Type:General  Level of Consciousness: awake, alert , and oriented  Airway & Oxygen Therapy: Patient Spontanous Breathing and Patient connected to nasal cannula oxygen  Post-op Assessment: Report given to RN, Post -op Vital signs reviewed and stable, and Patient moving all extremities X 4  Post vital signs: Reviewed and stable  Last Vitals:  Vitals Value Taken Time  BP 105/76 08/09/23 1420  Temp    Pulse 107 08/09/23 1421  Resp 21 08/09/23 1421  SpO2 94 % 08/09/23 1421  Vitals shown include unfiled device data.  Last Pain:  Vitals:   08/09/23 0830  TempSrc:   PainSc: 10-Worst pain ever      Patients Stated Pain Goal: 0 (08/09/23 0745)  Complications: There were no known notable events for this encounter.

## 2023-08-09 NOTE — TOC Transition Note (Signed)
 Transition of Care [facility]) - Discharge Note   Patient Details  Name: Elizabeth Spencer MRN: [id number] Date of Birth: April 12, 1985  Transition of Care [facility]) CM/SW Contact:  Jennett Model, RN Phone Number: 08/09/2023, 4:35 PM   Clinical Narrative:    For dc today, she will need cab voucher when she gets to [employer].  TOC to fill meds.          Patient Goals and CMS Choice            Discharge Placement                       Discharge Plan and Services Additional resources added to the After Visit Summary for                                       Social Drivers of Health (SDOH) Interventions SDOH Screenings   Food Insecurity: No Food Insecurity (08/08/2023)  Housing: Unknown (08/08/2023)  Transportation Needs: No Transportation Needs (08/08/2023)  Utilities: Not At Risk (08/08/2023)  Social Connections: Unknown (08/08/2023)  Tobacco Use: High Risk (08/08/2023)     Readmission Risk Interventions     No data to display

## 2023-08-09 NOTE — TOC CM/SW Note (Signed)
 Transition of Care [facility]) - Inpatient Brief Assessment   Patient Details  Name: ADAMINA LAVELLE MRN: [id number] Date of Birth: 09-10-1985  Transition [facility]) CM/SW Contact:    Jennett Model, RN Phone Number: 08/09/2023, 4:32 PM   Clinical Narrative: From home with boyfriend, has PCP and insurance on file, states has no HH services in place at this time , has a walker at home.  States will need cab voucher to get home at dc and boyfriend is support system, states gets medications from [facility] in [place] and [facility] pharmacy will fill meds. Gilmore Lager self ambulatory .   Transition of Care Asessment: Insurance and Status: Insurance coverage has been reviewed Patient has primary care physician: Yes [facility] [place]) Home environment has been reviewed: home with boyfriend Prior level of function:: indep Prior/Current Home Services: Current home services (walker) Social Drivers of Health Review: SDOH reviewed no interventions necessary Readmission risk has been reviewed: Yes Transition of care needs: no transition of care needs at this time

## 2023-08-09 NOTE — H&P (View-Only) (Signed)
 Patient Name: Elizabeth Spencer Date of Encounter: 08/09/2023  Primary Cardiologist: Sammy Crisp, MD Electrophysiologist: [name], MD  Interval Summary   The patient is frustrated about pain control, anxiety.  She reports she is stressed out, upset with staff.  States "she should slap the f&%&%$^ nurse". She is upset with the overnight covering MD for not emailing Dr. Daneil Dunker.  Frustrated that we have her on blood pressure medication and this is preventing her from getting pain medication. Asking for more pain medication and anxiety meds.  States she wants to go home.   Vital Signs    Vitals:   08/08/23 2300 08/08/23 2315 08/09/23 0100 08/09/23 0523  BP: (!) 88/63  114/63 (!) 100/57  Pulse: 98 93 83 92  Resp: 19 16 15 20   Temp:    97.7 F (36.5 C)  TempSrc:    Oral  SpO2: 96% 97% 97% 97%  Weight:    118.2 kg  Height:        Intake/Output Summary (Last 24 hours) at 08/09/2023 0950 Last data filed at 08/09/2023 0800 Gross per 24 hour  Intake 1110 ml  Output 15 ml  Net 1095 ml   Filed Weights   08/08/23 1029 08/09/23 0523  Weight: 112.5 kg 118.2 kg    Physical Exam    GEN- agitated / upset, in no physical acute distress   Lungs- Clear to ausculation bilaterally, normal work of breathing Cardiac- Regular rate and rhythm, no murmurs, rubs or gallops, surgical sites clean/dry, no significant edema GI- soft, NT, ND, + BS Extremities- no clubbing or cyanosis. No edema  Telemetry    SR 70-90's (personally reviewed)  Hospital Course    Elizabeth Spencer is a 38 y.o. female with PMH of bipolar disorder, former polysubstance / ETOH abuse, CAD s/p CABG x1, ICM, HFrEF, HTN, HLD, chronic low back pain, GERD who was seen in the outpatient setting and scheduled for S-ICD placement per Dr. Daneil Dunker.  The patient underwent S-ICD placement on 08/08/23.  Subsequent CXR on 08/09/23 showed lead had dislodged / migrated.  She was planned for lead revision.  The patient refused to have another  physician perform the revision.  Hospital course complicated by pain control issues. She is planned for lead revision on 08/09/23 per Dr. Daneil Dunker.   Assessment & Plan    Chronic Systolic HF due to ICM  LVEF <35% post revascularization, s/p S-ICD 08/08/23 with migrated / dislodged lead post procedure.  -NPO  -plan for lead revision per Dr. Daneil Dunker  -reassurance offered to patient, she refused another physician to perform lead revision  -pain control as below   Chronic Pain  Hx Polysubstance Abuse  Bipolar Disorder  -continue PRN oxycodone  for pain, BP has been limiting factor  -continue home Xanax  but on scheduled basis given situational anxiety      For questions or updates, please contact [facility] Please consult [email or web address] for contact info under Cardiology/STEMI.  Signed, Creighton Doffing, NP-C, AGACNP-BC [facility] - Electrophysiology  08/09/2023, 9:58 AM  I have seen, examined the patient, and reviewed the above assessment and plan.    Interval:  Some pain overnight otherwise no acute events. Upset and frustrated with pain management.   General: Well developed, in no acute distress.  Neck: No JVD.  Cardiac: Normal rate, regular rhythm. Left midaxillary incision and subxiphoid incisions without bleeding or hematoma.  Resp: Normal work of breathing.  Ext: No edema.  Neuro: No gross focal deficits.  Psych: Normal affect.  Assessment:  Elizabeth Spencer is a 38 y.o. 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   #.  Chronic systolic heart failure: EF remains less than 35% greater than 90 days after revascularization despite optimal medical therapy. NYHA II symptoms. #.  Ischemic  cardiomyopathy:  Plan:  - CXR this morning with dislodgement of the distal tip of the lead. Likely positioned in superficial fat and displaced by breast tissue with being upright. Impedance decreased to 85 ohms today. Can in good position. Will bring back to lab today to revise S-ICD lead. Will tie distal end of lead down to prevent future recurrences. Explained risks, benefits, and alternatives to ICD implantation, including but not limited to bleeding, infection, damage to heart or lungs, heart attack, stroke, or death.  Pt verbalized understanding and agrees to proceed if indicated.    [name], MD 08/09/2023 11:43 AM

## 2023-08-10 ENCOUNTER — Encounter (HOSPITAL_COMMUNITY): Payer: Self-pay | Admitting: Cardiology

## 2023-08-10 ENCOUNTER — Telehealth: Payer: Self-pay | Admitting: Family

## 2023-08-10 NOTE — Telephone Encounter (Signed)
 Patient left VM that she needs an in-home nurse to help her right now because she just had heart surgery. Should we order home health or PCS services? Says she needs help getting around the house and doing things for now. Please advise which will be appropriate.

## 2023-08-11 ENCOUNTER — Other Ambulatory Visit: Payer: Self-pay

## 2023-08-12 ENCOUNTER — Encounter: Payer: Self-pay | Admitting: Emergency Medicine

## 2023-08-16 NOTE — Progress Notes (Unsigned)
 Advanced Heart Failure Clinic Note    PCP: Trenda Frisk, FNP (last seen 04/25) Cardiologist: Sammy Crisp, MD / Toribio Frees, PA (last seen 10/24) EP: [name], MD (last seen 03/25)  Chief Complaint: left arm pain  HPI:  Elizabeth Spencer is a 38 y/o female kindly referred by Dr. Daneil Dunker. 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  Patient presented to ED in May of 2024 with chest pain. Had LHC which showed hazy eccentric 90% ostial LAD stenosis concerning for acute plaque rupture, tubular 40% proximal RCA stenosis large circumflex without significant disease. EF noted to be 30-35%. She underwent single vessel CABG, LIMA-LAD on 09/02/22.   Echo 02/24/23: EF 30-35%, G1DD, normal RV, moderate MR  Was in the ED 05/25/23 due to GERD.   Seen in [facility] 04/25 where entresto  was changed to losartan  due to hypotension. SBP was in the 80's so PCP subsequently stopped losartan .   Boston S-ICD implanted 08/08/23.   She presents today, with her boyfriend, for a HF follow-up visit with a chief complaint of left arm pain. She says that this has been present "ever since this device" and now "wants it out". She talked w/ her psychiatrist who told her to not take xanax  w/ oxycodone  so she hasn't been using that and the pain is terrible. Reports tenderness over laprascopic areas as well. Has tried heat and lidocaine  patches without relief. She has associated shortness of breath, fatigue, dizziness and increased anxiety along with this. Denies chest pain, palpitations, abdominal distention or pedal edema.   Was unable to tolerate losartan  due to hypotension. She  feels like her anxiety is "so much worse since the ICD was placed". She also mentions that she may need surgical clearance for thyroid  surgery in the future. She says that her ICD has shocked her once after she used a massage gun on her upper back.   She smokes 3 cigarettes daily, 1-2 twisted teas every couple of days, denies drug use. Very strong smell of marijuana from her boyfriend.    Past Medical History:  Diagnosis Date   Aneurysm (HCC) 01/04/2014   Arthritis    Asthma    WELL CONTROLLED   Brain aneurysm 2007   NEUROLOGY NOTE DOES NOT MENTION ANEURYSM BUT PT STATES SHE DID NOT HAVE TO HAVE SURGERY   Complication of anesthesia    FOR 1 C-SECTION PT WAS ITCHING AND VERY RED ON HER FACE   Cough, persistent 01/27/2016   Dermatitis due to sunburn 11/08/2013   Family history of adverse reaction to anesthesia    brother, neice and nephew got red in face with hives   Gallstones    GERD (gastroesophageal reflux disease)    Gonorrhea 06/20/2020   Headache    CHRONIC HEADACHES   History of chronic cough    DRY   Loss of memory 05/30/2017   Perforation of left tympanic membrane 05/17/2013   Pneumonia    PTSD (post-traumatic stress disorder)    PTSD (post-traumatic stress disorder)    Thyroid  condition    PT WAS JUST TOLD ON 08-25-17 THAT SHE HAS A THYROID  PROBLEM AND IS GOING TO F/U WITH ENDOCRINOLOGIST IN 2 WEEKS   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 90 tablet 0   atorvastatin  (LIPITOR) 40 MG tablet Take 1 tablet (40 mg total) by mouth daily. 90 tablet 3   benzonatate  (TESSALON  PERLES) 100 MG capsule Take 1  capsule (100 mg total) by mouth 3 (three) times daily as needed for cough. (Patient not taking: Reported on 07/25/2023) 30 capsule 1   Biotin w/ Vitamins C & E (HAIR SKIN & NAILS GUMMIES PO) Take 1 tablet by mouth daily. Biotin     cetirizine  (ZYRTEC ) 10 MG tablet Take 1 tablet (10 mg total) by mouth daily. (Patient taking differently: Take 10 mg by mouth at bedtime.) 90 tablet 1   clopidogrel  (PLAVIX ) 75 MG tablet Take 1 tablet (75 mg total) by mouth daily. 90 tablet 3   dapagliflozin  propanediol (FARXIGA ) 10 MG TABS tablet Take 1 tablet (10 mg total) by mouth daily before breakfast. 90 tablet 1   diphenhydrAMINE  (BENADRYL ) 25 MG tablet Take 50 mg by mouth every 6 (six) hours as needed (allergic reaction). Dye and glutin free     EPIPEN  2-PAK 0.3 MG/0.3ML SOAJ injection INJECT INTO THIGH MUSCLE THROUGH CLOTHES AS NEEDED SEVERE ALLERGIC REACTION 2 each 1   fluticasone  (FLONASE ) 50 MCG/ACT nasal spray 1 spray by Each Nare route daily. (Patient taking differently: Place 1 spray into both nostrils daily as needed for allergies.) 16 g 6   furosemide  (LASIX ) 20 MG tablet Take 1 tablet (20 mg total) by mouth daily as needed. (Patient taking differently: Take 20 mg by mouth daily as needed for edema or fluid (Gain 3 lbs as needed).) 30 tablet 2   hydrOXYzine  (ATARAX ) 25 MG tablet Take 25 mg by mouth 3 (three) times daily as needed. (Patient not taking: Reported on 08/03/2023)     methimazole  (TAPAZOLE ) 10 MG tablet Take 1 tablet (10 mg total) by mouth daily. (Patient taking differently: Take 15 mg by mouth daily.) 90 tablet 1   metoprolol  succinate (TOPROL  XL) 25 MG 24 hr tablet Take 1 tablet (25 mg total) by mouth daily. 90 tablet 3   montelukast  (SINGULAIR ) 10 MG tablet Take 1 tablet (10 mg total) by mouth at bedtime. (Patient taking differently: Take 10 mg by mouth in the morning.) 90 tablet 1   omeprazole  (PRILOSEC) 20 MG capsule Take 1 capsule (20 mg total) by mouth 2 (two) times daily. 180 capsule 1    ondansetron  (ZOFRAN -ODT) 4 MG disintegrating tablet Take 1 tablet (4 mg total) by mouth every 8 (eight) hours as needed for nausea or vomiting. 20 tablet 0   oxyCODONE  (OXY IR/ROXICODONE ) 5 MG immediate release tablet Take 1 tablet (5 mg total) by mouth every 6 (six) hours as needed for moderate pain (pain score 4-6) or severe pain (pain score 7-10). 15 tablet 0   spironolactone  (ALDACTONE ) 25 MG tablet Take 1 tablet (25 mg total) by mouth daily. 90 tablet 3   No current facility-administered medications for this visit.    Allergies  Allergen Reactions   Azithromycin Swelling and Other (See Comments)   Clindamycin  Swelling   Codeine Hives and Other (See Comments)    Nausea/dizzy  Meloxicam  Other (See Comments)    Burns up from the inside    Neurontin [Gabapentin] Other (See Comments)    Makes her pass out and not remember what happened prior to taking med.   Penicillins Anaphylaxis    Patient allergic to all "cillins" Has patient had a PCN reaction causing immediate rash, facial/tongue/throat swelling, SOB or lightheadedness with hypotension: Yes Has patient had a PCN reaction causing severe rash involving mucus membranes or skin necrosis: Yes Has patient had a PCN reaction that required hospitalization: No Has patient had a PCN reaction occurring within the last 10 years: Yes If all of the above answers are "NO", then may proceed with Cephalosporin use.    Clindamycin /Lincomycin Swelling   Other Hives and Other (See Comments)    Berry flavored food and drinks   Diphenhydramine  Hcl Rash and Other (See Comments)   Doxycycline Rash   Egg-Derived Products Rash      Social History   Socioeconomic History   Marital status: Single    Spouse name: Not on file   Number of children: Not on file   Years of education: Not on file   Highest education level: Not on file  Occupational History   Not on file  Tobacco Use   Smoking status: Every Day    Current packs/day: 0.50     Average packs/day: 0.5 packs/day for 18.0 years (9.0 ttl pk-yrs)    Types: Cigarettes    Passive exposure: Past   Smokeless tobacco: Never  Vaping Use   Vaping status: Never Used  Substance and Sexual Activity   Alcohol use: Yes    Alcohol/week: 0.0 standard drinks of alcohol    Comment: occasionally   Drug use: Not Currently    Types: Marijuana, Cocaine   Sexual activity: Yes    Partners: Male    Birth control/protection: Surgical    Comment: Hysterectomy  Other Topics Concern   Not on file  Social History Narrative   Not on file   Social Drivers of Health   Financial Resource Strain: Not on file  Food Insecurity: No Food Insecurity (08/08/2023)   Hunger Vital Sign    Worried About Running Out of Food in the Last Year: Never true    Ran Out of Food in the Last Year: Never true  Transportation Needs: No Transportation Needs (08/08/2023)   PRAPARE - Administrator, Civil Service (Medical): No    Lack of Transportation (Non-Medical): No  Physical Activity: Not on file  Stress: Not on file  Social Connections: Unknown (08/08/2023)   Social Connection and Isolation Panel [NHANES]    Frequency of Communication with Friends and Family: More than three times a week    Frequency of Social Gatherings with Friends and Family: More than three times a week    Attends Religious Services: Not on file    Active Member of Clubs or Organizations: Not on file    Attends Banker Meetings: Not on file    Marital Status: Living with partner  Intimate Partner Violence: Not At Risk (08/08/2023)   Humiliation, Afraid, Rape, and Kick questionnaire    Fear of Current or Ex-Partner: No    Emotionally Abused: No    Physically Abused: No    Sexually Abused: No      Family History  Problem Relation Age of Onset   Hypertension Mother    Alcohol abuse Mother    Deep vein thrombosis Mother    Alcohol abuse Father  COPD Father    Emphysema Father    Heart disease Father     Vitals:   08/17/23 1325  BP: (!) 107/94  Pulse: 99  SpO2: 100%  Weight: 247 lb 6.4 oz (112.2 kg)   Wt Readings from Last 3 Encounters:  08/17/23 247 lb 6.4 oz (112.2 kg)  08/09/23 260 lb 9.3 oz (118.2 kg)  07/25/23 248 lb 12.8 oz (112.9 kg)   Lab Results  Component Value Date   CREATININE 0.75 07/25/2023   CREATININE 0.87 05/25/2023   CREATININE 0.66 04/22/2023    PHYSICAL EXAM:  General: Well appearing. No resp difficulty HEENT: normal Neck: supple, no JVD Cor: Regular rhythm, rate. No rubs, gallops or murmurs Lungs: clear Abdomen: soft, nontender, nondistended. Extremities: no cyanosis, clubbing, rash, edema Neuro: alert & oriented X 3. Moves all 4 extremities w/o difficulty. Affect pleasant although very anxious   ECG: not done   ASSESSMENT & PLAN:  1: Ischemic cardiomyopathy with reduced ejection fraction- - CABG with LIMA-LAD on 09/02/22.  - NYHA class III - euvolemic - weight down 4 pounds from last visit here 1 month ago - Echo 02/24/23: EF 30-35%, G1DD, normal RV, moderate MR - saw EP Daneil Dunker) 03/25 for evaluation of AICD; Boston S-ICD implanted 08/08/23 - continue farxiga  10mg  daily - continue furosemide  20mg  daily PRN; reviewed parameters of when to take this - continue metoprolol  succinate 25mg  daily; BP will not allow for titration - continue spironolactone  25mg  daily - unable to tolerate losartan  due to hypotension - BMET 07/25/23 reviewed: sodium 141, potassium 3.8, creatinine 0.75 & GFR 103 - has upcoming lab work at PCP office; will reach out and ask her to check BMET  2: CAD- - saw cardiology Gennaro Khat) 10/24 - CABG with LIMA-LAD on 09/02/22.  - continue clopidogrel  75mg  daily - continue ASA 81mg  daily  3: Dyslipidemia- - continue atorvastatin  40mg  daily - LDL 04/22/23 was 55 - saw PCP Monroe [place]ua) 04/25  4: Insomnia- - [place] home sleep study given to her at last visit but she's concerned about using it now that she has a ICD - will reach out  to EP to confirm that it's ok to use  5: Anxiety- - currently taking alprazolam  XR 0.5mg  BID PRN - acknowledges an increase in her anxiety since ICD implantation & now wants the ICD out; she didn't realize it would be this painful - encouraged her to reach out to EP office to discuss concerns but that taking it out is extremely unlikely   Return in 4-6 weeks to see the MD for to determine if she needs any advanced therapies.  Elizabeth Console, FNP 08/16/23

## 2023-08-17 ENCOUNTER — Telehealth: Payer: Self-pay | Admitting: Cardiology

## 2023-08-17 ENCOUNTER — Ambulatory Visit: Payer: MEDICAID | Attending: Family | Admitting: Family

## 2023-08-17 ENCOUNTER — Encounter: Payer: Self-pay | Admitting: Family

## 2023-08-17 VITALS — BP 107/94 | HR 99 | Wt 247.4 lb

## 2023-08-17 DIAGNOSIS — M545 Low back pain, unspecified: Secondary | ICD-10-CM | POA: Insufficient documentation

## 2023-08-17 DIAGNOSIS — E782 Mixed hyperlipidemia: Secondary | ICD-10-CM | POA: Diagnosis not present

## 2023-08-17 DIAGNOSIS — I255 Ischemic cardiomyopathy: Secondary | ICD-10-CM | POA: Diagnosis not present

## 2023-08-17 DIAGNOSIS — Z7982 Long term (current) use of aspirin: Secondary | ICD-10-CM | POA: Diagnosis not present

## 2023-08-17 DIAGNOSIS — I2510 Atherosclerotic heart disease of native coronary artery without angina pectoris: Secondary | ICD-10-CM | POA: Insufficient documentation

## 2023-08-17 DIAGNOSIS — G4700 Insomnia, unspecified: Secondary | ICD-10-CM | POA: Insufficient documentation

## 2023-08-17 DIAGNOSIS — Z7984 Long term (current) use of oral hypoglycemic drugs: Secondary | ICD-10-CM | POA: Insufficient documentation

## 2023-08-17 DIAGNOSIS — E785 Hyperlipidemia, unspecified: Secondary | ICD-10-CM | POA: Diagnosis not present

## 2023-08-17 DIAGNOSIS — I25118 Atherosclerotic heart disease of native coronary artery with other forms of angina pectoris: Secondary | ICD-10-CM | POA: Diagnosis not present

## 2023-08-17 DIAGNOSIS — G4730 Sleep apnea, unspecified: Secondary | ICD-10-CM | POA: Insufficient documentation

## 2023-08-17 DIAGNOSIS — Z79899 Other long term (current) drug therapy: Secondary | ICD-10-CM | POA: Insufficient documentation

## 2023-08-17 DIAGNOSIS — F17210 Nicotine dependence, cigarettes, uncomplicated: Secondary | ICD-10-CM | POA: Insufficient documentation

## 2023-08-17 DIAGNOSIS — F419 Anxiety disorder, unspecified: Secondary | ICD-10-CM | POA: Diagnosis not present

## 2023-08-17 DIAGNOSIS — K219 Gastro-esophageal reflux disease without esophagitis: Secondary | ICD-10-CM | POA: Insufficient documentation

## 2023-08-17 DIAGNOSIS — Z951 Presence of aortocoronary bypass graft: Secondary | ICD-10-CM | POA: Diagnosis not present

## 2023-08-17 DIAGNOSIS — G8929 Other chronic pain: Secondary | ICD-10-CM | POA: Insufficient documentation

## 2023-08-17 DIAGNOSIS — M79602 Pain in left arm: Secondary | ICD-10-CM | POA: Diagnosis present

## 2023-08-17 DIAGNOSIS — R0602 Shortness of breath: Secondary | ICD-10-CM | POA: Insufficient documentation

## 2023-08-17 DIAGNOSIS — E559 Vitamin D deficiency, unspecified: Secondary | ICD-10-CM | POA: Insufficient documentation

## 2023-08-17 DIAGNOSIS — I5022 Chronic systolic (congestive) heart failure: Secondary | ICD-10-CM

## 2023-08-17 DIAGNOSIS — R7303 Prediabetes: Secondary | ICD-10-CM | POA: Insufficient documentation

## 2023-08-17 DIAGNOSIS — Z7902 Long term (current) use of antithrombotics/antiplatelets: Secondary | ICD-10-CM | POA: Insufficient documentation

## 2023-08-17 DIAGNOSIS — Z95810 Presence of automatic (implantable) cardiac defibrillator: Secondary | ICD-10-CM | POA: Insufficient documentation

## 2023-08-17 DIAGNOSIS — F319 Bipolar disorder, unspecified: Secondary | ICD-10-CM | POA: Diagnosis not present

## 2023-08-17 DIAGNOSIS — R5383 Other fatigue: Secondary | ICD-10-CM | POA: Insufficient documentation

## 2023-08-17 NOTE — Telephone Encounter (Signed)
 Patient came by office  States she is having some left arm pain from the defibrillator implant At her Psyche evaluation she was told she cannot mix her pain pill and anxiety medication Would like to know if there are other options Please call to discuss

## 2023-08-17 NOTE — Patient Instructions (Addendum)
 It was good to see you today!  Medication Changes:  No medication changes today!  Follow-Up in: Please follow up with the Advance [facility] Failure Clinic in 6 weeks.  At the [facility] Clinic, you and your health needs are our priority. We have a designated team specialized in the treatment of Heart Failure. This Care Team includes your primary Heart Failure Specialized Cardiologist (physician), Advanced Practice Providers (APPs- Physician Assistants and Nurse Practitioners), and Pharmacist who all work together to provide you with the care you need, when you need it.   You may see any of the following providers on your designated Care Team at your next follow up:  Dr. Jules Oar Dr. Peder Bourdon Dr. Alwin Baars Dr. Judyth Nunnery Shawnee Dellen, FNP Bevely Brush, RPH-CPP  Please be sure to bring in all your medications bottles to every appointment.   Need to Contact Us :  If you have any questions or concerns before your next appointment please send us  a message through [place] or call our office at [phone].    TO LEAVE A MESSAGE FOR THE NURSE SELECT OPTION 2, PLEASE LEAVE A MESSAGE INCLUDING: YOUR NAME DATE OF BIRTH CALL BACK NUMBER REASON FOR CALL**this is important as we prioritize the call backs  YOU WILL RECEIVE A CALL BACK THE SAME DAY AS LONG AS YOU CALL BEFORE 4:00 PM

## 2023-08-17 NOTE — Telephone Encounter (Signed)
 Spoke with the patient who states that she went to a psych appointment yesterday and they told her that she could not take her xanax  with oxycodone . She states that it is causing her to bite her lip (?). She reports extreme anxiety so she has to take her xanax . She refuses to take any other psych medications. She states that she is still in a lot of pain from her left shoulder down to her wrist. It is worse at night. She has tried using a heating pad which only relieves her pain temporarily. Pain has been ongoing since she left the hospital. She did try taking a tylenol  today with no relief. She has also tried lidocaine  patches with no relief. She would like to know what else she can take. Advised that notes from her discharge summary indicate she could continue her xanax  with as needed oxycodone . She states that oxycodone  has not been touching her pain. She also reports a torn shoulder and issues with discs 6/7 but her story is hard to follow.  She was seen in CHF clinic with no changes to treatment plan, but was give a home sleep study.  Advised that I would make Dr. Daneil Dunker aware of her concerns.

## 2023-08-18 ENCOUNTER — Telehealth: Payer: Self-pay

## 2023-08-18 NOTE — Progress Notes (Signed)
 A user error has taken place: encounter opened in error, closed for administrative reasons.

## 2023-08-22 ENCOUNTER — Telehealth: Payer: Self-pay | Admitting: Family

## 2023-08-22 NOTE — Telephone Encounter (Signed)
 Pt c/o medication issue:  1. Name of Medication:   oxyCODONE  (OXY IR/ROXICODONE ) 5 MG immediate release tablet   2. How are you currently taking this medication (dosage and times per day)?   3. Are you having a reaction (difficulty breathing--STAT)?   4. What is your medication issue?    Patient wants a call back to discuss getting prescribed pain medication.  Patient noted she has been without oxyCODONE  (OXY IR/ROXICODONE ) 5 MG immediate release tablet since last week.

## 2023-08-22 NOTE — Telephone Encounter (Signed)
 Patient called in stating that she cannot take her Xanax  and Oxycodone  together. She needs something different sent in. The cardiologist said he could send her in tramadol . Is this okay with you? If so, please reach out to [name], MD her cardiologist.

## 2023-08-22 NOTE — Telephone Encounter (Signed)
 Pt requesting cb to get update on meds. She states she is in a lot of pain

## 2023-08-23 MED ORDER — TRAMADOL HCL 50 MG PO TABS: 50.0000 mg | ORAL_TABLET | Freq: Three times a day (TID) | ORAL | 0 refills | Status: DC

## 2023-08-23 NOTE — Telephone Encounter (Signed)
 Per Mylinda Asa already talked with  Daneil Dunker

## 2023-08-24 ENCOUNTER — Ambulatory Visit: Payer: MEDICAID | Attending: Cardiology

## 2023-08-24 DIAGNOSIS — I5022 Chronic systolic (congestive) heart failure: Secondary | ICD-10-CM

## 2023-08-24 NOTE — Progress Notes (Signed)
 Normal SUB-Q (single) chamber ICD wound check. Wound well healed. Presenting rhythm: VS 94. Routine testing performed. Thresholds, sensing, and impedance 94 ohms.  No treated arrhythmias. Reviewed arm restrictions to continue for 6 weeks total post op. Reviewed shock plan.  Pt enrolled in remote follow-up.

## 2023-08-24 NOTE — Patient Instructions (Signed)

## 2023-08-24 NOTE — Telephone Encounter (Signed)
Prescription was sent in.

## 2023-08-25 ENCOUNTER — Telehealth: Payer: Self-pay | Admitting: Cardiothoracic Surgery

## 2023-08-25 ENCOUNTER — Ambulatory Visit: Payer: MEDICAID | Attending: Cardiology | Admitting: Cardiology

## 2023-08-25 ENCOUNTER — Telehealth: Payer: Self-pay

## 2023-08-25 VITALS — BP 122/90 | HR 93 | Ht 67.0 in | Wt 250.6 lb

## 2023-08-25 DIAGNOSIS — Z95810 Presence of automatic (implantable) cardiac defibrillator: Secondary | ICD-10-CM

## 2023-08-25 DIAGNOSIS — I5022 Chronic systolic (congestive) heart failure: Secondary | ICD-10-CM

## 2023-08-25 NOTE — Progress Notes (Addendum)
 Wound check appointment to reassess central chest incision. Patient had routine wound check yesterday with device RN. Per notes, incisions were well-healed.  Patient called device clinic this morning with bleeding from central chest incision, soaking gauze.   Central chest Incision has dried blood present but no frank bleeding identified. Wound is closed.  Reinforced chest wound with steri-strips. No showers for 4 days.  Avoid wearing bras.  Device was not interrogated.   Patient requests re-assessment appt early next week prior to showering to ensure appropriate healing of wound, which is reasonable.   Appt scheduled with device clinic RN 5/27 at 10am in [place] office.

## 2023-08-25 NOTE — Telephone Encounter (Signed)
 The pt states she was seen yesterday and her device site is bleeding. I told her the nurse was trying to get her an appointment with the [facility] office. I told her when she notice the bleeding to hold pressure.

## 2023-08-25 NOTE — Telephone Encounter (Signed)
 Spoke w/ pt and let her know that we scheduled appt w/ SR in [place] at 245

## 2023-08-25 NOTE — Telephone Encounter (Signed)
 Device clinic apt made today in [place] office at 2:45. Pt aware of location, date and time.

## 2023-08-25 NOTE — Telephone Encounter (Signed)
 Patient called tonight to say that she's still bleeding through the bandage, overlaying gauze has been soaked through. No dizziness.  Instructed her to get back in bed, add a few pieces of gauze on top, hold firm pressure for 3 minutes without lifting up to see if oozing stops.  If still bleeding after that, emergency room would be where the wound can receive medical attention.  She stated that she would prefer to wait until morning to call the device clinic.

## 2023-08-25 NOTE — Patient Instructions (Signed)
 Medication Instructions:  The current medical regimen is effective;  continue present plan and medications as directed. Please refer to the Current Medication list given to you today.   *If you need a refill on your cardiac medications before your next appointment, please call your pharmacy*  Follow-Up: At [facility], you and your health needs are our priority.  As part of our continuing mission to provide you with exceptional heart care, our providers are all part of one team.  This team includes your primary Cardiologist (physician) and Advanced Practice Providers or APPs (Physician Assistants and Nurse Practitioners) who all work together to provide you with the care you need, when you need it.  Your next appointment:   08/30/2023 with DEVICE CLINIC   We recommend signing up for the patient portal called "MyChart".  Sign up information is provided on this After Visit Summary.  MyChart is used to connect with patients for Virtual Visits (Telemedicine).  Patients are able to view lab/test results, encounter notes, upcoming appointments, etc.  Non-urgent messages can be sent to your provider as well.   To learn more about what you can do with MyChart, go to [email or web address].

## 2023-08-26 NOTE — Telephone Encounter (Signed)
 Called patient, she states she began bleeding from the incision last night, the butterfly stitches that were placed at the office yesterday the "middle part" is gone due to bleeding. She states the incision is "open" which she believes is why she was bleeding. Patient states it is not bleeding now, but she is waiting for her "blood thinner" medication to come in as she ran out and has not taken it this morning yet. She will take it once she gets it. Patient aware if the incision is open she should go to the ER for evaluation and due to consistent bleeding. Patient verbalized understanding. She will wait for her medication to arrive at her house and then she will go.

## 2023-08-30 ENCOUNTER — Ambulatory Visit: Payer: MEDICAID | Attending: Cardiology

## 2023-08-30 DIAGNOSIS — Z95810 Presence of automatic (implantable) cardiac defibrillator: Secondary | ICD-10-CM | POA: Insufficient documentation

## 2023-08-30 NOTE — Progress Notes (Signed)
 Wound check f/u in clinic. CDI approximated. No redness/erythema. No s/s of infection. Educated patient about wound care and s/s infection.

## 2023-08-31 ENCOUNTER — Encounter: Payer: Self-pay | Admitting: Internal Medicine

## 2023-09-01 ENCOUNTER — Emergency Department (HOSPITAL_COMMUNITY): Payer: MEDICAID

## 2023-09-01 ENCOUNTER — Telehealth: Payer: Self-pay | Admitting: Cardiology

## 2023-09-01 ENCOUNTER — Encounter (HOSPITAL_COMMUNITY): Payer: Self-pay

## 2023-09-01 ENCOUNTER — Other Ambulatory Visit: Payer: Self-pay

## 2023-09-01 ENCOUNTER — Inpatient Hospital Stay (HOSPITAL_COMMUNITY)
Admission: EM | Admit: 2023-09-01 | Discharge: 2023-09-06 | DRG: 856 | Discharge status: Against Medical Advice | Payer: MEDICAID | Attending: Internal Medicine | Admitting: Internal Medicine

## 2023-09-01 ENCOUNTER — Ambulatory Visit: Payer: MEDICAID | Attending: Cardiology | Admitting: Cardiology

## 2023-09-01 ENCOUNTER — Inpatient Hospital Stay (HOSPITAL_COMMUNITY): Payer: MEDICAID

## 2023-09-01 VITALS — BP 118/62 | HR 126 | Resp 18 | Ht 67.0 in

## 2023-09-01 DIAGNOSIS — T827XXA Infection and inflammatory reaction due to other cardiac and vascular devices, implants and grafts, initial encounter: Secondary | ICD-10-CM | POA: Diagnosis present

## 2023-09-01 DIAGNOSIS — E119 Type 2 diabetes mellitus without complications: Secondary | ICD-10-CM | POA: Diagnosis present

## 2023-09-01 DIAGNOSIS — A419 Sepsis, unspecified organism: Secondary | ICD-10-CM | POA: Diagnosis present

## 2023-09-01 DIAGNOSIS — I428 Other cardiomyopathies: Secondary | ICD-10-CM | POA: Diagnosis present

## 2023-09-01 DIAGNOSIS — E876 Hypokalemia: Secondary | ICD-10-CM | POA: Diagnosis present

## 2023-09-01 DIAGNOSIS — Y831 Surgical operation with implant of artificial internal device as the cause of abnormal reaction of the patient, or of later complication, without mention of misadventure at the time of the procedure: Secondary | ICD-10-CM | POA: Diagnosis present

## 2023-09-01 DIAGNOSIS — Z79899 Other long term (current) drug therapy: Secondary | ICD-10-CM

## 2023-09-01 DIAGNOSIS — I2510 Atherosclerotic heart disease of native coronary artery without angina pectoris: Secondary | ICD-10-CM | POA: Diagnosis present

## 2023-09-01 DIAGNOSIS — Z9102 Food additives allergy status: Secondary | ICD-10-CM

## 2023-09-01 DIAGNOSIS — Z8249 Family history of ischemic heart disease and other diseases of the circulatory system: Secondary | ICD-10-CM | POA: Diagnosis not present

## 2023-09-01 DIAGNOSIS — Z95810 Presence of automatic (implantable) cardiac defibrillator: Secondary | ICD-10-CM

## 2023-09-01 DIAGNOSIS — T8149XA Infection following a procedure, other surgical site, initial encounter: Secondary | ICD-10-CM

## 2023-09-01 DIAGNOSIS — Z6839 Body mass index (BMI) 39.0-39.9, adult: Secondary | ICD-10-CM

## 2023-09-01 DIAGNOSIS — Z9079 Acquired absence of other genital organ(s): Secondary | ICD-10-CM

## 2023-09-01 DIAGNOSIS — F4310 Post-traumatic stress disorder, unspecified: Secondary | ICD-10-CM | POA: Diagnosis present

## 2023-09-01 DIAGNOSIS — E785 Hyperlipidemia, unspecified: Secondary | ICD-10-CM | POA: Diagnosis present

## 2023-09-01 DIAGNOSIS — T8141XA Infection following a procedure, superficial incisional surgical site, initial encounter: Secondary | ICD-10-CM | POA: Diagnosis present

## 2023-09-01 DIAGNOSIS — R000 Tachycardia, unspecified: Secondary | ICD-10-CM | POA: Diagnosis not present

## 2023-09-01 DIAGNOSIS — K219 Gastro-esophageal reflux disease without esophagitis: Secondary | ICD-10-CM | POA: Diagnosis present

## 2023-09-01 DIAGNOSIS — I671 Cerebral aneurysm, nonruptured: Secondary | ICD-10-CM | POA: Diagnosis present

## 2023-09-01 DIAGNOSIS — T827XXD Infection and inflammatory reaction due to other cardiac and vascular devices, implants and grafts, subsequent encounter: Secondary | ICD-10-CM | POA: Diagnosis not present

## 2023-09-01 DIAGNOSIS — Z91012 Allergy to eggs: Secondary | ICD-10-CM

## 2023-09-01 DIAGNOSIS — Z885 Allergy status to narcotic agent status: Secondary | ICD-10-CM | POA: Diagnosis not present

## 2023-09-01 DIAGNOSIS — E0590 Thyrotoxicosis, unspecified without thyrotoxic crisis or storm: Secondary | ICD-10-CM | POA: Diagnosis present

## 2023-09-01 DIAGNOSIS — E66811 Obesity, class 1: Secondary | ICD-10-CM | POA: Diagnosis present

## 2023-09-01 DIAGNOSIS — I255 Ischemic cardiomyopathy: Secondary | ICD-10-CM | POA: Diagnosis present

## 2023-09-01 DIAGNOSIS — T8140XA Infection following a procedure, unspecified, initial encounter: Principal | ICD-10-CM

## 2023-09-01 DIAGNOSIS — F17210 Nicotine dependence, cigarettes, uncomplicated: Secondary | ICD-10-CM | POA: Diagnosis present

## 2023-09-01 DIAGNOSIS — Z4502 Encounter for adjustment and management of automatic implantable cardiac defibrillator: Secondary | ICD-10-CM | POA: Diagnosis not present

## 2023-09-01 DIAGNOSIS — F418 Other specified anxiety disorders: Secondary | ICD-10-CM | POA: Diagnosis not present

## 2023-09-01 DIAGNOSIS — Z951 Presence of aortocoronary bypass graft: Secondary | ICD-10-CM

## 2023-09-01 DIAGNOSIS — Z9049 Acquired absence of other specified parts of digestive tract: Secondary | ICD-10-CM

## 2023-09-01 DIAGNOSIS — Z7902 Long term (current) use of antithrombotics/antiplatelets: Secondary | ICD-10-CM

## 2023-09-01 DIAGNOSIS — F419 Anxiety disorder, unspecified: Secondary | ICD-10-CM | POA: Diagnosis present

## 2023-09-01 DIAGNOSIS — Z886 Allergy status to analgesic agent status: Secondary | ICD-10-CM

## 2023-09-01 DIAGNOSIS — I110 Hypertensive heart disease with heart failure: Secondary | ICD-10-CM | POA: Diagnosis present

## 2023-09-01 DIAGNOSIS — I252 Old myocardial infarction: Secondary | ICD-10-CM | POA: Diagnosis not present

## 2023-09-01 DIAGNOSIS — Z881 Allergy status to other antibiotic agents status: Secondary | ICD-10-CM | POA: Diagnosis not present

## 2023-09-01 DIAGNOSIS — I5022 Chronic systolic (congestive) heart failure: Secondary | ICD-10-CM | POA: Diagnosis present

## 2023-09-01 DIAGNOSIS — Z888 Allergy status to other drugs, medicaments and biological substances status: Secondary | ICD-10-CM

## 2023-09-01 DIAGNOSIS — Z7982 Long term (current) use of aspirin: Secondary | ICD-10-CM

## 2023-09-01 DIAGNOSIS — R110 Nausea: Secondary | ICD-10-CM | POA: Diagnosis not present

## 2023-09-01 DIAGNOSIS — Z880 Allergy status to penicillin: Secondary | ICD-10-CM

## 2023-09-01 DIAGNOSIS — Z713 Dietary counseling and surveillance: Secondary | ICD-10-CM

## 2023-09-01 LAB — TROPONIN I (HIGH SENSITIVITY)
Troponin I (High Sensitivity): 5 ng/L (ref <18)
Troponin I (High Sensitivity): 5 ng/L (ref <18)

## 2023-09-01 LAB — COMPREHENSIVE METABOLIC PANEL WITH GFR
ALT: 32 U/L (ref 0–44)
AST: 26 U/L (ref 15–41)
Albumin: 3.7 g/dL (ref 3.5–5.0)
Alkaline Phosphatase: 73 U/L (ref 38–126)
Anion gap: 12 (ref 5–15)
BUN: 8 mg/dL (ref 6–20)
CO2: 18 mmol/L — ABNORMAL LOW (ref 22–32)
Calcium: 8.9 mg/dL (ref 8.9–10.3)
Chloride: 101 mmol/L (ref 98–111)
Creatinine, Ser: 0.9 mg/dL (ref 0.44–1.00)
GFR, Estimated: >60 mL/min (ref >60)
Glucose, Bld: 111 mg/dL — ABNORMAL HIGH (ref 70–99)
Potassium: 3.6 mmol/L (ref 3.5–5.1)
Sodium: 131 mmol/L — ABNORMAL LOW (ref 135–145)
Total Bilirubin: 1.4 mg/dL — ABNORMAL HIGH (ref 0.0–1.2)
Total Protein: 7 g/dL (ref 6.5–8.1)

## 2023-09-01 LAB — CBC WITH DIFFERENTIAL/PLATELET
Abs Immature Granulocytes: 0.27 10*3/uL — ABNORMAL HIGH (ref 0.00–0.07)
Basophils Absolute: 0.1 10*3/uL (ref 0.0–0.1)
Basophils Relative: 0 %
Eosinophils Absolute: 0 10*3/uL (ref 0.0–0.5)
Eosinophils Relative: 0 %
HCT: 46.3 % — ABNORMAL HIGH (ref 36.0–46.0)
Hemoglobin: 15.4 g/dL — ABNORMAL HIGH (ref 12.0–15.0)
Immature Granulocytes: 1 %
Lymphocytes Relative: 7 %
Lymphs Abs: 1.7 10*3/uL (ref 0.7–4.0)
MCH: 29.7 pg (ref 26.0–34.0)
MCHC: 33.3 g/dL (ref 30.0–36.0)
MCV: 89.2 fL (ref 80.0–100.0)
Monocytes Absolute: 1.6 10*3/uL — ABNORMAL HIGH (ref 0.1–1.0)
Monocytes Relative: 6 %
Neutro Abs: 21.7 10*3/uL — ABNORMAL HIGH (ref 1.7–7.7)
Neutrophils Relative %: 86 %
Platelets: 217 10*3/uL (ref 150–400)
RBC: 5.19 MIL/uL — ABNORMAL HIGH (ref 3.87–5.11)
RDW: 13.2 % (ref 11.5–15.5)
Smear Review: NORMAL
WBC: 25.3 10*3/uL — ABNORMAL HIGH (ref 4.0–10.5)
nRBC: 0 % (ref 0.0–0.2)

## 2023-09-01 LAB — I-STAT CG4 LACTIC ACID, ED
Lactic Acid, Venous: 1.5 mmol/L (ref 0.5–1.9)
Lactic Acid, Venous: 0.8 mmol/L (ref 0.5–1.9)

## 2023-09-01 LAB — HCG, SERUM, QUALITATIVE: Preg, Serum: NEGATIVE

## 2023-09-01 MED ORDER — LORATADINE 10 MG PO TABS
10.0000 mg | ORAL_TABLET | Freq: Every day | ORAL | Status: DC
  Filled 2023-09-01 (×5): qty 1

## 2023-09-01 MED ORDER — ONDANSETRON HCL 4 MG PO TABS
4.0000 mg | ORAL_TABLET | Freq: Four times a day (QID) | ORAL | Status: DC | PRN
  Administered 2023-09-02: 4 mg via ORAL
  Filled 2023-09-01: qty 1

## 2023-09-01 MED ORDER — SODIUM CHLORIDE 0.9 % IV BOLUS
500.0000 mL | Freq: Once | INTRAVENOUS | Status: AC
  Administered 2023-09-01: 500 mL via INTRAVENOUS

## 2023-09-01 MED ORDER — ATORVASTATIN CALCIUM 40 MG PO TABS
40.0000 mg | ORAL_TABLET | Freq: Every day | ORAL | Status: DC
  Administered 2023-09-02 – 2023-09-06 (×5): 40 mg via ORAL
  Filled 2023-09-01 (×5): qty 1

## 2023-09-01 MED ORDER — SENNOSIDES-DOCUSATE SODIUM 8.6-50 MG PO TABS: 1.0000 | ORAL_TABLET | Freq: Every evening | ORAL | Status: DC | PRN

## 2023-09-01 MED ORDER — ACETAMINOPHEN 650 MG RE SUPP: 650.0000 mg | Freq: Four times a day (QID) | RECTAL | Status: DC | PRN

## 2023-09-01 MED ORDER — VANCOMYCIN HCL 2000 MG/400ML IV SOLN
2000.0000 mg | Freq: Once | INTRAVENOUS | Status: AC
  Administered 2023-09-01: 2000 mg via INTRAVENOUS
  Filled 2023-09-01: qty 400

## 2023-09-01 MED ORDER — METOPROLOL SUCCINATE ER 25 MG PO TB24
25.0000 mg | ORAL_TABLET | Freq: Every day | ORAL | Status: DC
  Administered 2023-09-02 – 2023-09-06 (×4): 25 mg via ORAL
  Filled 2023-09-01 (×5): qty 1

## 2023-09-01 MED ORDER — METHIMAZOLE 5 MG PO TABS
15.0000 mg | ORAL_TABLET | Freq: Every day | ORAL | Status: DC
  Administered 2023-09-02 – 2023-09-06 (×5): 15 mg via ORAL
  Filled 2023-09-01 (×5): qty 1

## 2023-09-01 MED ORDER — ACETAMINOPHEN 325 MG PO TABS
650.0000 mg | ORAL_TABLET | Freq: Four times a day (QID) | ORAL | Status: DC | PRN
  Administered 2023-09-06: 650 mg via ORAL
  Filled 2023-09-01 (×2): qty 2

## 2023-09-01 MED ORDER — SPIRONOLACTONE 25 MG PO TABS: 25.0000 mg | ORAL_TABLET | Freq: Every day | ORAL | Status: DC

## 2023-09-01 MED ORDER — ALPRAZOLAM 0.5 MG PO TABS
0.5000 mg | ORAL_TABLET | Freq: Two times a day (BID) | ORAL | Status: DC | PRN
  Administered 2023-09-02 – 2023-09-05 (×8): 0.5 mg via ORAL
  Filled 2023-09-01 (×8): qty 1

## 2023-09-01 MED ORDER — PANTOPRAZOLE SODIUM 40 MG PO TBEC
40.0000 mg | DELAYED_RELEASE_TABLET | Freq: Every day | ORAL | Status: DC
  Administered 2023-09-02 – 2023-09-03 (×2): 40 mg via ORAL
  Filled 2023-09-01 (×2): qty 1

## 2023-09-01 MED ORDER — MONTELUKAST SODIUM 10 MG PO TABS
10.0000 mg | ORAL_TABLET | Freq: Every day | ORAL | Status: DC
  Administered 2023-09-02 – 2023-09-06 (×5): 10 mg via ORAL
  Filled 2023-09-01 (×5): qty 1

## 2023-09-01 MED ORDER — CEPHALEXIN 500 MG PO CAPS: ORAL_CAPSULE | ORAL | 0 refills | Status: DC

## 2023-09-01 MED ORDER — SODIUM CHLORIDE 0.9 % IV BOLUS: 1000.0000 mL | Freq: Once | INTRAVENOUS | Status: DC

## 2023-09-01 MED ORDER — ONDANSETRON HCL 4 MG/2ML IJ SOLN
4.0000 mg | Freq: Once | INTRAMUSCULAR | Status: AC
  Administered 2023-09-01: 4 mg via INTRAVENOUS
  Filled 2023-09-01: qty 2

## 2023-09-01 MED ORDER — TRAMADOL HCL 50 MG PO TABS
50.0000 mg | ORAL_TABLET | Freq: Three times a day (TID) | ORAL | Status: DC
  Administered 2023-09-02 (×2): 50 mg via ORAL
  Filled 2023-09-01 (×2): qty 1

## 2023-09-01 MED ORDER — SODIUM CHLORIDE 0.9 % IV SOLN
2.0000 g | Freq: Once | INTRAVENOUS | Status: AC
  Administered 2023-09-01: 2 g via INTRAVENOUS
  Filled 2023-09-01: qty 12.5

## 2023-09-01 MED ORDER — ONDANSETRON HCL 4 MG/2ML IJ SOLN
4.0000 mg | Freq: Four times a day (QID) | INTRAMUSCULAR | Status: DC | PRN
  Administered 2023-09-02 – 2023-09-05 (×2): 4 mg via INTRAVENOUS
  Filled 2023-09-01: qty 2

## 2023-09-01 MED ORDER — ENOXAPARIN SODIUM 40 MG/0.4ML IJ SOSY
40.0000 mg | PREFILLED_SYRINGE | INTRAMUSCULAR | Status: DC
Start: 2023-09-02 — End: 2023-09-05
  Administered 2023-09-02 – 2023-09-04 (×3): 40 mg via SUBCUTANEOUS
  Filled 2023-09-01 (×3): qty 0.4

## 2023-09-01 MED ORDER — LORAZEPAM 2 MG/ML IJ SOLN
1.0000 mg | Freq: Once | INTRAMUSCULAR | Status: AC
  Administered 2023-09-01: 1 mg via INTRAVENOUS
  Filled 2023-09-01: qty 1

## 2023-09-01 MED ORDER — ASPIRIN 81 MG PO CHEW: 81.0000 mg | CHEWABLE_TABLET | Freq: Every day | ORAL | Status: DC

## 2023-09-01 MED ORDER — FENTANYL CITRATE PF 50 MCG/ML IJ SOSY
50.0000 ug | PREFILLED_SYRINGE | Freq: Once | INTRAMUSCULAR | Status: AC
  Administered 2023-09-01: 50 ug via INTRAVENOUS
  Filled 2023-09-01: qty 1

## 2023-09-01 MED ORDER — CLOPIDOGREL BISULFATE 75 MG PO TABS
75.0000 mg | ORAL_TABLET | Freq: Every day | ORAL | Status: DC
  Administered 2023-09-02 – 2023-09-04 (×3): 75 mg via ORAL
  Filled 2023-09-01 (×3): qty 1

## 2023-09-01 NOTE — ED Provider Notes (Signed)
 Patient here with drainage from her chest wall at her defibrillator incision site.  This is a supervised resident visit.  Patient with history of asthma and brain aneurysm.  She had defibrillator placed here recently.  Had to have it redone shortly afterwards.  She has been having drainage here now for a while.  She saw cardiology today.  However pain got worse.  She has developed low-grade fever tachycardia.  Looks like there is pus coming from surgical site in her anterior chest wall.  We contacted Dr. Austine Blunt with cardiology who came down to evaluate the patient and agrees that this is likely a surgical site infection.  Broad-spectrum IV antibiotics ordered sepsis workup initiated.  Patient with a white count of 25.  Lactic acid 1.5.  Overall chest x-ray unremarkable.  After talking with cardiology we will get a CT scan to further evaluate to see if there is a deeper infectious process but will admit to medicine.  EP team to see the patient in the morning.  Hemodynamically stable throughout her care.  Overall patient with sepsis likely secondary to surgical site.  This chart was dictated using voice recognition software.  Despite best efforts to proofread,  errors can occur which can change the documentation meaning.   .Critical Care  Performed by: Lowery Rue, DO Authorized by: Lowery Rue, DO   Critical care provider statement:    Critical care time (minutes):  35   Critical care was necessary to treat or prevent imminent or life-threatening deterioration of the following conditions:  Sepsis   Critical care was time spent personally by me on the following activities:  Blood draw for specimens, development of treatment plan with patient or surrogate, discussions with consultants, discussions with primary provider, examination of patient, evaluation of patient's response to treatment, obtaining history from patient or surrogate, ordering and performing treatments and interventions, ordering and  review of laboratory studies, ordering and review of radiographic studies, pulse oximetry, re-evaluation of patient's condition and review of old charts   Care discussed with: admitting provider         Lowery Rue, DO 09/01/23 2216
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
## 2023-09-01 NOTE — Sepsis Progress Note (Signed)
 Following for sepsis monitoring ?

## 2023-09-01 NOTE — Progress Notes (Signed)
 Wound check appointment to reassess central chest incision.  Central chest incision with very small amount of sero-sanginous drainage from one small area of incision Pressure applied with no further bleeding Gauze and tagederm applied  Close follow-up scheduled next week when Dr. Daneil Dunker is in office Patient is to leave dressing on incision. No showering or getting incision wet.

## 2023-09-01 NOTE — ED Triage Notes (Signed)
 Pt BIB [facility] from home for incisional leaking/bleeding after defibrillator placement on 5/5.  138/86 125HR

## 2023-09-01 NOTE — Telephone Encounter (Signed)
 Spoke with pt who reports she continues to have burning pain and bleeding from site during the night.  She has taken 3 half tablets of Tramadol  to help with the pain since last night.  She states site is not currently "gushing blood" but when she got up she did have blood running down her abdomen.  She has applied pressure.  Pt states she plans to go to the ED but does not have the money right now for an Baby Bolt and is "waiting for the prices to come down." Pt advised will forward to device clinic and Dr Orinda Birkenhead nurse.  Reviewed ED precautions.  Pt verbalizes understanding and agrees with current plan.

## 2023-09-01 NOTE — Telephone Encounter (Signed)
 Patient is following up, post-implant. She says last night she was in excruciating pain and gushing blood at site of incision. She is still in pain and bleeding currently and plans to go to the ED.

## 2023-09-01 NOTE — Telephone Encounter (Signed)
 Patient did not answer. Phone listed VM full, s/o straight to VM and VM full, and number on DPR not in service.

## 2023-09-01 NOTE — Telephone Encounter (Signed)
 Spoke w. Patient and scheduled for 11 today w/ Ridde NP in [place]. I advised pt to hold pressure to stop the bleeding in the interim. Pt was agitated on the phone. She reports that she's trying to hold pressure but is limited to light pressure d/t an intense burning from the site. I told her to just try her best and if she feels like it gets to the point that she needs to go to the ED than it would be our advisement to do so. I do not think it is necessary at this time to go to ED and would best be served with appointment mentioned above and I relayed that to her. I advised in this way due to her elevated level of anxiety/agitation, and if she felt it needed immediate attention we wouldn't be able to accommodate any sooner than 1.5 hours in advance.

## 2023-09-01 NOTE — ED Provider Triage Note (Signed)
 Emergency Medicine Provider Triage Evaluation Note  Elizabeth Spencer , a 38 y.o. female  was evaluated in triage.  Pt complains of post defibrillator placement presents with persistent bleeding.  She subjective fevers, chills and chest pain.  Has been vomiting.  Review of Systems  Positive:  Negative:   Physical Exam  BP 111/81 (BP Location: Right Arm)   Pulse (!) 129   Temp 99.4 F (37.4 C) (Oral)   Resp 19   Ht 5\' 7"  (1.702 m)   Wt 113.6 kg   LMP 08/22/2017 Comment: says she just spots.  SpO2 96%   BMI 39.22 kg/m  Gen:   Awake, no distress   Resp:  Normal effort  MSK:   Moves extremities without difficulty  Other:  Surgical site filled with bloody Tegaderm  Medical Decision Making  Medically screening exam initiated at 7:53 PM.  Appropriate orders placed.  Elizabeth Spencer was informed that the remainder of the evaluation will be completed by another provider, this initial triage assessment does not replace that evaluation, and the importance of remaining in the ED until their evaluation is complete.     Felicie Horning, PA-C 09/01/23 1954

## 2023-09-01 NOTE — Telephone Encounter (Signed)
 Called patient to attempt to make appointment for open slot @ 11AM w/ Riddle NP.

## 2023-09-01 NOTE — H&P (Incomplete)
 History and Physical  [name]:[id number] DOB: November 12, 1985 DOA: 09/01/2023  PCP: Trenda Frisk, FNP   Chief Complaint: Chest wall pain, N/V, fever  HPI: Elizabeth Spencer is a 38 y.o. female with medical history significant for ICM/HFrEF, CAD s/p CABG, morbid obesity, T2DM, HLD, HTN, asthma, migraines, hyperthyroidism, PTSD, anxiety and recent SQ ICD placement 08/08/23 with lead revision 08/09/23 who presents to the ED for evaluation of chest wall pain, nausea, vomiting and fevers.  ED Course: ***  Review of Systems: Please see HPI for pertinent positives and negatives. A complete 10 system review of systems are otherwise negative.  Past Medical History:  Diagnosis Date   Aneurysm (HCC) 01/04/2014   Arthritis    Asthma    WELL CONTROLLED   Brain aneurysm 2007   NEUROLOGY NOTE DOES NOT MENTION ANEURYSM BUT PT STATES SHE DID NOT HAVE TO HAVE SURGERY   Complication of anesthesia    FOR 1 C-SECTION PT WAS ITCHING AND VERY RED ON HER FACE   Cough, persistent 01/27/2016   Dermatitis due to sunburn 11/08/2013   Family history of adverse reaction to anesthesia    brother, neice and nephew got red in face with hives   Gallstones    GERD (gastroesophageal reflux disease)    Gonorrhea 06/20/2020   Headache    CHRONIC HEADACHES   History of chronic cough    DRY   Loss of memory 05/30/2017   Perforation of left tympanic membrane 05/17/2013   Pneumonia    PTSD (post-traumatic stress disorder)    PTSD (post-traumatic stress disorder)    Thyroid  condition    PT WAS JUST TOLD ON 08-25-17 THAT SHE HAS A THYROID  PROBLEM AND IS GOING TO F/U WITH ENDOCRINOLOGIST IN 2 WEEKS   Trichomonas vaginalis (TV) infection 06/20/2020   Past Surgical History:  Procedure Laterality Date   ABDOMINAL HYSTERECTOMY     CESAREAN SECTION     x3   CORONARY ARTERY BYPASS GRAFT N/A 09/02/2022   Procedure: CORONARY ARTERY BYPASS GRAFTING (CABG) X 1 USING LEFT INTERNAL MAMMARY ARTERY;  Surgeon: Bartley Lightning, MD;  Location: [facility] OR;  Service: Open Heart Surgery;  Laterality: N/A;   CYSTOSCOPY N/A 09/29/2017   Procedure: CYSTOSCOPY;  Surgeon: Darl Edu, MD;  Location: [facility] ORS;  Service: Gynecology;  Laterality: N/A;   CYSTOSCOPY N/A 11/05/2017   Procedure: CYSTOSCOPY;  Surgeon: Darl Edu, MD;  Location: [facility] OR[facility];  Service: Gynecology;  Laterality: N/A;   HYSTEROSCOPY WITH D & C N/A 09/01/2017   Procedure: DILATATION AND CURETTAGE /HYSTEROSCOPY;  Surgeon: Darl Edu, MD;  Location: [facility] OR[facility];  Service: Gynecology;  Laterality: N/A;   LAPAROSCOPY N/A 09/01/2017   Procedure: LAPAROSCOPY OPERATIVE with biopsy;  Surgeon: Darl Edu, MD;  Location: [facility];  Service: Gynecology;  Laterality: N/A;   LAPAROSCOPY N/A 11/05/2017   Procedure: LAPAROSCOPY DIAGNOSTIC;  Surgeon: Darl Edu, MD;  Location: [facility];  Service: Gynecology;  Laterality: N/A;   LEFT HEART CATH AND CORONARY ANGIOGRAPHY N/A 09/01/2022   Procedure: LEFT HEART CATH AND CORONARY ANGIOGRAPHY;  Surgeon: Sammy Crisp, MD;  Location: [facility] INVASIVE CV LAB;  Service: Cardiovascular;  Laterality: N/A;   REPAIR VAGINAL CUFF N/A 11/05/2017   Procedure: REPAIR VAGINAL CUFF;  Surgeon: Darl Edu, MD;  Location: [facility];  Service: Gynecology;  Laterality: N/A;   SUBQ ICD IMPLANT N/A 08/08/2023   Procedure: SUBQ ICD IMPLANT;  Surgeon: [name], MD;  Location: [facility] INVASIVE CV LAB;  Service: Cardiovascular;  Laterality: N/A;  SUBQ ICD REVISION N/A 08/09/2023   Procedure: SUBQ ICD REVISION;  Surgeon: [name], MD;  Location: [facility]AS[facility] CV LAB;  Service: Cardiovascular;  Laterality: N/A;   TEE WITHOUT CARDIOVERSION N/A 09/02/2022   Procedure: TRANSESOPHAGEAL ECHOCARDIOGRAM;  Surgeon: Bartley Lightning, MD;  Location: [facility] OR;  Service: Open Heart Surgery;  Laterality: N/A;   TOTAL LAPAROSCOPIC HYSTERECTOMY WITH SALPINGECTOMY Bilateral 09/29/2017   Procedure: TOTAL LAPAROSCOPIC HYSTERECTOMY WITH  SALPINGECTOMY;  Surgeon: Darl Edu, MD;  Location: [facility] OR[facility];  Service: Gynecology;  Laterality: Bilateral;   Social History:  reports that she has been smoking cigarettes. She has a 9 pack-year smoking history. She has been exposed to tobacco smoke. She has never used smokeless tobacco. She reports current alcohol use. She reports that she does not currently use drugs after having used the following drugs: Marijuana and Cocaine.  Allergies  Allergen Reactions   Azithromycin Swelling and Other (See Comments)   Clindamycin  Swelling   Codeine Hives and Other (See Comments)    Nausea/dizzy    Meloxicam  Other (See Comments)    Burns up from the inside    Neurontin [Gabapentin] Other (See Comments)    Makes her pass out and not remember what happened prior to taking med.   Penicillins Anaphylaxis    Patient allergic to all "cillins" Has patient had a PCN reaction causing immediate rash, facial/tongue/throat swelling, SOB or lightheadedness with hypotension: Yes Has patient had a PCN reaction causing severe rash involving mucus membranes or skin necrosis: Yes Has patient had a PCN reaction that required hospitalization: No Has patient had a PCN reaction occurring within the last 10 years: Yes If all of the above answers are "NO", then may proceed with Cephalosporin use.    Clindamycin /Lincomycin Swelling   Other Hives and Other (See Comments)    Berry flavored food and drinks   Diphenhydramine  Hcl Rash and Other (See Comments)    Uses dye-free; allergic to dye in regular benadryl    Doxycycline Rash   Egg-Derived Products Rash    Family History  Problem Relation Age of Onset   Hypertension Mother    Alcohol abuse Mother    Deep vein thrombosis Mother    Alcohol abuse Father    COPD Father    Emphysema Father    Heart disease Father      Prior to Admission medications   Medication Sig Start Date End Date Taking? Authorizing Provider  acetaminophen  (TYLENOL ) 500 MG tablet  Take 1,000 mg by mouth every 6 (six) hours as needed.   Yes [provider]  ALPRAZolam  (ALPRAZOLAM  XR) 0.5 MG 24 hr tablet TAKE 1 TABLET BY MOUTH TWICE DAILY AS NEEDED FOR ANXIETY OR SLEEP Patient taking differently: Take 0.5 mg by mouth 2 (two) times daily as needed for anxiety. 07/14/23  Yes Trenda Frisk, FNP  aspirin  81 MG chewable tablet Chew 1 tablet (81 mg total) by mouth daily. 05/03/23  Yes Furth, Cadence H, PA-C  atorvastatin  (LIPITOR) 40 MG tablet Take 1 tablet (40 mg total) by mouth daily. 08/01/23  Yes Shawnee Dellen A, FNP  Biotin w/ Vitamins C & E (HAIR SKIN & NAILS GUMMIES PO) Take 1 tablet by mouth daily. Biotin   Yes [provider]  cetirizine  (ZYRTEC ) 10 MG tablet Take 1 tablet (10 mg total) by mouth daily. Patient taking differently: Take 10 mg by mouth at bedtime. 08/01/23  Yes Trenda Frisk, FNP  clopidogrel  (PLAVIX ) 75 MG tablet Take 1 tablet (75 mg total) by  mouth daily. 08/01/23  Yes Charlette Console, FNP  dapagliflozin  propanediol (FARXIGA ) 10 MG TABS tablet Take 1 tablet (10 mg total) by mouth daily before breakfast. 08/01/23  Yes Trenda Frisk, FNP  diphenhydrAMINE  (BENADRYL ) 25 MG tablet Take 50 mg by mouth every 6 (six) hours as needed (allergic reaction). Dye and glutin free   Yes [provider]  EPIPEN  2-PAK 0.3 MG/0.3ML SOAJ injection INJECT INTO THIGH MUSCLE THROUGH CLOTHES AS NEEDED SEVERE ALLERGIC REACTION 08/01/23  Yes Trenda Frisk, FNP  fluticasone  (FLONASE ) 50 MCG/ACT nasal spray 1 spray by Each Nare route daily. Patient taking differently: Place 1 spray into both nostrils daily as needed for allergies. 06/17/22  Yes Trenda Frisk, FNP  furosemide  (LASIX ) 20 MG tablet Take 1 tablet (20 mg total) by mouth daily as needed. 06/14/23 09/12/23 Yes [name], MD  methimazole  (TAPAZOLE ) 10 MG tablet Take 1 tablet (10 mg total) by mouth daily. Patient taking differently: Take 15 mg by mouth daily. 07/14/23  Yes Trenda Frisk, FNP  metoprolol  succinate (TOPROL  XL) 25 MG 24 hr tablet Take 1 tablet (25 mg total) by mouth daily. 08/01/23  Yes Hackney, Brian Campanile A, FNP  montelukast  (SINGULAIR ) 10 MG tablet Take 1 tablet (10 mg total) by mouth at bedtime. Patient taking differently: Take 10 mg by mouth in the morning. 08/01/23  Yes Trenda Frisk, FNP  omeprazole  (PRILOSEC) 20 MG capsule Take 1 capsule (20 mg total) by mouth 2 (two) times daily. 08/01/23  Yes Trenda Frisk, FNP  ondansetron  (ZOFRAN -ODT) 4 MG disintegrating tablet Take 1 tablet (4 mg total) by mouth every 8 (eight) hours as needed for nausea or vomiting. 08/01/23  Yes Trenda Frisk, FNP  spironolactone  (ALDACTONE ) 25 MG tablet Take 1 tablet (25 mg total) by mouth daily. 08/01/23  Yes Hackney, Brian Campanile A, FNP  traMADol  (ULTRAM ) 50 MG tablet Take 1 tablet (50 mg total) by mouth every 8 (eight) hours. 08/23/23  Yes [name], MD  Ubrogepant  100 MG TABS Take 1 tablet by mouth See admin instructions. Take one tablet by mouth at onset of migraine. May take an additional tablet two hours later if migraine persists.  No more than 2 tablets in 24 hours.   Yes [provider]    Physical Exam: BP 102/68   Pulse (!) 121   Temp 99.4 F (37.4 C) (Oral)   Resp (!) 21   Ht 5\' 7"  (1.702 m)   Wt 113.6 kg   LMP 08/22/2017 Comment: says she just spots.  SpO2 99%   BMI 39.22 kg/m  ***         Labs on Admission:  Basic Metabolic Panel: Recent Labs  Lab 09/01/23 2050  NA 131*  K 3.6  CL 101  CO2 18*  GLUCOSE 111*  BUN 8  CREATININE 0.90  CALCIUM  8.9   Liver Function Tests: Recent Labs  Lab 09/01/23 2050  AST 26  ALT 32  ALKPHOS 73  BILITOT 1.4*  PROT 7.0  ALBUMIN  3.7   No results for input(s): "LIPASE", "AMYLASE" in the last 168 hours. No results for input(s): "AMMONIA" in the last 168 hours. CBC: Recent Labs  Lab 09/01/23 2050  WBC 25.3*  NEUTROABS 21.7*  HGB 15.4*  HCT 46.3*  MCV 89.2  PLT 217   Cardiac Enzymes: No  results for input(s): "CKTOTAL", "CKMB", "CKMBINDEX", "TROPONINI" in the last 168 hours. BNP (last 3 results) No results for input(s): "BNP" in the last 8760 hours.  ProBNP (last 3 results) No results for input(s): "PROBNP" in the last 8760 hours.  CBG: No results for input(s): "GLUCAP" in the last 168 hours.  Radiological Exams on Admission: DG Chest Portable 1 View Result Date: 09/01/2023 CLINICAL DATA:  Postop.  Defibrillator placed 5/5 EXAM: PORTABLE CHEST 1 VIEW COMPARISON:  Most recent radiograph 08/09/2023 FINDINGS: Stable positioning of subcutaneous pacemaker with battery pack on the left. Prior median sternotomy. Stable heart size and mediastinal contours. Mild atelectasis at the left lung base. No acute airspace disease. No pleural fluid or pneumothorax. IMPRESSION: 1. Stable positioning of subcutaneous pacemaker. No pneumothorax. 2. Mild left basilar atelectasis. Electronically Signed   By: Chadwick Colonel M.D.   On: 09/01/2023 21:11   Assessment/Plan Elizabeth Spencer is a 38 y.o. female with medical history significant for  ICM/HFrEF, CAD s/p CABG, morbid obesity, T2DM, HLD, HTN, asthma, migraines, hyperthyroidism, PTSD, anxiety and recent SQ ICD placement 08/08/23 with lead revision 08/09/23 who presents to the ED for evaluation of chest wall pain, nausea, vomiting and fevers.   # Sepsis # Surgical site infection  # Ischemic cardiomyopathy s/p ICD # HFrEF  # HTN - Continue Toprol  XL as per lactone  # Hyperthyroidism - Continue methimazole   # T2DM - Hold Farxiga   # CAD - Continue aspirin  and Plavix   # HLD - Continue atorvastatin   # Anxiety - Continue as needed alprazolam   # GERD -PPI  DVT prophylaxis: Lovenox      Code Status: Full Code  Consults called: Cardiology  Family Communication: Discussed admission with boyfriend on the phone  Severity of Illness: The appropriate patient status for this patient is INPATIENT. Inpatient status is judged to be  reasonable and necessary in order to provide the required intensity of service to ensure the patient's safety. The patient's presenting symptoms, physical exam findings, and initial radiographic and laboratory data in the context of their chronic comorbidities is felt to place them at high risk for further clinical deterioration. Furthermore, it is not anticipated that the patient will be medically stable for discharge from the hospital within 2 midnights of admission.   * I certify that at the point of admission it is my clinical judgment that the patient will require inpatient hospital care spanning beyond 2 midnights from the point of admission due to high intensity of service, high risk for further deterioration and high frequency of surveillance required.*  Level of care: Telemetry Medical   This record has been created using Conservation officer, historic buildings. Errors have been sought and corrected, but may not always be located. Such creation errors do not reflect on the standard of care.   Vita Grip, MD 09/01/2023, 11:17 PM [facility] Pager: [phone] Isaiah 41:10   If 7PM-7AM, please contact night-coverage [email or web address] Password [email or web address]

## 2023-09-01 NOTE — Consult Note (Signed)
 Cardiology Consultation   Patient ID: ARIONNE IAMS MRN: [id number]; DOB: [birth year]  Admit date: 09/01/2023 Date of Consult: 09/01/2023  PCP:  Trenda Frisk, FNP   [facility] Providers Cardiologist:  Sammy Crisp, MD  Electrophysiologist:  [name], MD       Patient Profile: Elizabeth Spencer is a 38 y.o. female with a hx of ICM HFrEF, CAD s/p CABG, HLD, recent SQ ICD placement 08/08/23 and lead revision 08/09/23 who is being seen 09/01/2023 for the evaluation of anterior chest and lateral chest pain over areas of incision at the request of Dr. Yvonne Hering.  History of Present Illness: Recently had subcutaneous ICD placed on 08/08/23, had to have it revised on 08/09/23 through a central chest incision because the lead had migrated to an inadequate position. Since then she notes that the anterior xiphoid incision never fully healed and she has had bleeding from the site multiple times a day. She's visited cardiology a few times and they've held pressure and redressed the wound, but it still hasn't felt better.   Starting yesterday, she notes increasing nausea/vomiting and subjective fevers/chills. Continues to have anterior chest wall pain and left lateral chest pain over the site of the incision. She only notes bleeding from the incision site and isn't sure if she has seen any purulence from the site.   Cardiology saw her today and redressed the bleeding incision. She didn't feel like it was getting any better so she decided to present to the ED for evaluation. In the ED, she is sinus tachy to the 130s, WBC 25, T 99.22F.  Past Medical History:  Diagnosis Date   Aneurysm (HCC) 01/04/2014   Arthritis    Asthma    WELL CONTROLLED   Brain aneurysm 2007   NEUROLOGY NOTE DOES NOT MENTION ANEURYSM BUT PT STATES SHE DID NOT HAVE TO HAVE SURGERY   Complication of anesthesia    FOR 1 C-SECTION PT WAS ITCHING AND VERY RED ON HER FACE   Cough, persistent 01/27/2016   Dermatitis due  to sunburn 11/08/2013   Family history of adverse reaction to anesthesia    brother, neice and nephew got red in face with hives   Gallstones    GERD (gastroesophageal reflux disease)    Gonorrhea 06/20/2020   Headache    CHRONIC HEADACHES   History of chronic cough    DRY   Loss of memory 05/30/2017   Perforation of left tympanic membrane 05/17/2013   Pneumonia    PTSD (post-traumatic stress disorder)    PTSD (post-traumatic stress disorder)    Thyroid  condition    PT WAS JUST TOLD ON 08-25-17 THAT SHE HAS A THYROID  PROBLEM AND IS GOING TO F/U WITH ENDOCRINOLOGIST IN 2 WEEKS   Trichomonas vaginalis (TV) infection 06/20/2020    Past Surgical History:  Procedure Laterality Date   ABDOMINAL HYSTERECTOMY     CESAREAN SECTION     x3   CORONARY ARTERY BYPASS GRAFT N/A 09/02/2022   Procedure: CORONARY ARTERY BYPASS GRAFTING (CABG) X 1 USING LEFT INTERNAL MAMMARY ARTERY;  Surgeon: Bartley Lightning, MD;  Location: [facility] OR;  Service: Open Heart Surgery;  Laterality: N/A;   CYSTOSCOPY N/A 09/29/2017   Procedure: CYSTOSCOPY;  Surgeon: Darl Edu, MD;  Location: [facility] ORS;  Service: Gynecology;  Laterality: N/A;   CYSTOSCOPY N/A 11/05/2017   Procedure: CYSTOSCOPY;  Surgeon: Darl Edu, MD;  Location: [facility] ORS;  Service: Gynecology;  Laterality: N/A;   HYSTEROSCOPY WITH D & C N/A  09/01/2017   Procedure: DILATATION AND CURETTAGE /HYSTEROSCOPY;  Surgeon: Darl Edu, MD;  Location: [facility] ORS;  Service: Gynecology;  Laterality: N/A;   LAPAROSCOPY N/A 09/01/2017   Procedure: LAPAROSCOPY OPERATIVE with biopsy;  Surgeon: Darl Edu, MD;  Location: [facility] OR[facility];  Service: Gynecology;  Laterality: N/A;   LAPAROSCOPY N/A 11/05/2017   Procedure: LAPAROSCOPY DIAGNOSTIC;  Surgeon: Darl Edu, MD;  Location: [facility] OR[facility];  Service: Gynecology;  Laterality: N/A;   LEFT HEART CATH AND CORONARY ANGIOGRAPHY N/A 09/01/2022   Procedure: LEFT HEART CATH AND CORONARY ANGIOGRAPHY;  Surgeon: Sammy Crisp, MD;  Location: [facility] INVASIVE CV LAB;  Service: Cardiovascular;  Laterality: N/A;   REPAIR VAGINAL CUFF N/A 11/05/2017   Procedure: REPAIR VAGINAL CUFF;  Surgeon: Darl Edu, MD;  Location: [facility] OR[facility];  Service: Gynecology;  Laterality: N/A;   SUBQ ICD IMPLANT N/A 08/08/2023   Procedure: SUBQ ICD IMPLANT;  Surgeon: [name], MD;  Location: [facility] INVASIVE CV LAB;  Service: Cardiovascular;  Laterality: N/A;   SUBQ ICD REVISION N/A 08/09/2023   Procedure: SUBQ ICD REVISION;  Surgeon: [name], MD;  Location: [facility] INVASIVE CV LAB;  Service: Cardiovascular;  Laterality: N/A;   TEE WITHOUT CARDIOVERSION N/A 09/02/2022   Procedure: TRANSESOPHAGEAL ECHOCARDIOGRAM;  Surgeon: Bartley Lightning, MD;  Location: [facility] OR;  Service: Open Heart Surgery;  Laterality: N/A;   TOTAL LAPAROSCOPIC HYSTERECTOMY WITH SALPINGECTOMY Bilateral 09/29/2017   Procedure: TOTAL LAPAROSCOPIC HYSTERECTOMY WITH SALPINGECTOMY;  Surgeon: Darl Edu, MD;  Location: [facility] ORS;  Service: Gynecology;  Laterality: Bilateral;     Home Medications:  Prior to Admission medications   Medication Sig Start Date End Date Taking? Authorizing Provider  ALPRAZolam  (ALPRAZOLAM  XR) 0.5 MG 24 hr tablet TAKE 1 TABLET BY MOUTH TWICE DAILY AS NEEDED FOR ANXIETY OR SLEEP Patient taking differently: Take 0.5 mg by mouth 2 (two) times daily as needed for anxiety. 07/14/23   Trenda Frisk, FNP  aspirin  81 MG chewable tablet Chew 1 tablet (81 mg total) by mouth daily. 05/03/23   Furth, Cadence H, PA-C  atorvastatin  (LIPITOR) 40 MG tablet Take 1 tablet (40 mg total) by mouth daily. 08/01/23   Charlette Console, FNP  Biotin w/ Vitamins C & E (HAIR SKIN & NAILS GUMMIES PO) Take 1 tablet by mouth daily. Biotin    [provider]  cephALEXin  (KEFLEX ) 500 MG capsule Take 1 tablet twice daily for 1 week. 09/01/23   Riddle, Suzann, NP  cetirizine  (ZYRTEC ) 10 MG tablet Take 1 tablet (10 mg total) by mouth daily. Patient taking differently:  Take 10 mg by mouth at bedtime. 08/01/23   Trenda Frisk, FNP  clopidogrel  (PLAVIX ) 75 MG tablet Take 1 tablet (75 mg total) by mouth daily. 08/01/23   Charlette Console, FNP  dapagliflozin  propanediol (FARXIGA ) 10 MG TABS tablet Take 1 tablet (10 mg total) by mouth daily before breakfast. 08/01/23   Trenda Frisk, FNP  diphenhydrAMINE  (BENADRYL ) 25 MG tablet Take 50 mg by mouth every 6 (six) hours as needed (allergic reaction). Dye and glutin free    [provider]  EPIPEN  2-PAK 0.3 MG/0.3ML SOAJ injection INJECT INTO THIGH MUSCLE THROUGH CLOTHES AS NEEDED SEVERE ALLERGIC REACTION 08/01/23   Trenda Frisk, FNP  fluticasone  (FLONASE ) 50 MCG/ACT nasal spray 1 spray by Each Nare route daily. Patient taking differently: Place 1 spray into both nostrils daily as needed for allergies. 06/17/22   Trenda Frisk, FNP  furosemide  (LASIX ) 20 MG tablet Take 1 tablet (20 mg  total) by mouth daily as needed. Patient taking differently: Take 20 mg by mouth daily as needed for edema or fluid (Gain 3 lbs as needed). 06/14/23 09/12/23  [name], MD  methimazole  (TAPAZOLE ) 10 MG tablet Take 1 tablet (10 mg total) by mouth daily. Patient taking differently: Take 15 mg by mouth daily. 07/14/23   Trenda Frisk, FNP  metoprolol  succinate (TOPROL  XL) 25 MG 24 hr tablet Take 1 tablet (25 mg total) by mouth daily. 08/01/23   Charlette Console, FNP  montelukast  (SINGULAIR ) 10 MG tablet Take 1 tablet (10 mg total) by mouth at bedtime. Patient taking differently: Take 10 mg by mouth in the morning. 08/01/23   Trenda Frisk, FNP  omeprazole  (PRILOSEC) 20 MG capsule Take 1 capsule (20 mg total) by mouth 2 (two) times daily. 08/01/23   Trenda Frisk, FNP  ondansetron  (ZOFRAN -ODT) 4 MG disintegrating tablet Take 1 tablet (4 mg total) by mouth every 8 (eight) hours as needed for nausea or vomiting. 08/01/23   Trenda Frisk, FNP  oxyCODONE  (OXY IR/ROXICODONE ) 5 MG immediate release tablet Take 1 tablet  (5 mg total) by mouth every 6 (six) hours as needed for moderate pain (pain score 4-6) or severe pain (pain score 7-10). Patient not taking: Reported on 09/01/2023 08/09/23   Thomasena Fleming, NP  spironolactone  (ALDACTONE ) 25 MG tablet Take 1 tablet (25 mg total) by mouth daily. 08/01/23   Charlette Console, FNP  traMADol  (ULTRAM ) 50 MG tablet Take 1 tablet (50 mg total) by mouth every 8 (eight) hours. 08/23/23   [name], MD    Scheduled Meds:  Continuous Infusions:  PRN Meds:   Allergies:    Allergies  Allergen Reactions   Azithromycin Swelling and Other (See Comments)   Clindamycin  Swelling   Codeine Hives and Other (See Comments)    Nausea/dizzy    Meloxicam  Other (See Comments)    Burns up from the inside    Neurontin [Gabapentin] Other (See Comments)    Makes her pass out and not remember what happened prior to taking med.   Penicillins Anaphylaxis    Patient allergic to all "cillins" Has patient had a PCN reaction causing immediate rash, facial/tongue/throat swelling, SOB or lightheadedness with hypotension: Yes Has patient had a PCN reaction causing severe rash involving mucus membranes or skin necrosis: Yes Has patient had a PCN reaction that required hospitalization: No Has patient had a PCN reaction occurring within the last 10 years: Yes If all of the above answers are "NO", then may proceed with Cephalosporin use.    Clindamycin /Lincomycin Swelling   Other Hives and Other (See Comments)    Berry flavored food and drinks   Diphenhydramine  Hcl Rash and Other (See Comments)   Doxycycline Rash   Egg-Derived Products Rash    Social History:   Social History   Socioeconomic History   Marital status: Single    Spouse name: Not on file   Number of children: Not on file   Years of education: Not on file   Highest education level: Not on file  Occupational History   Not on file  Tobacco Use   Smoking status: Every Day    Current packs/day: 0.50    Average  packs/day: 0.5 packs/day for 18.0 years (9.0 ttl pk-yrs)    Types: Cigarettes    Passive exposure: Past   Smokeless tobacco: Never  Vaping Use   Vaping status: Never Used  Substance and Sexual Activity   Alcohol  use: Yes    Alcohol/week: 0.0 standard drinks of alcohol    Comment: occasionally   Drug use: Not Currently    Types: Marijuana, Cocaine   Sexual activity: Yes    Partners: Male    Birth control/protection: Surgical    Comment: Hysterectomy  Other Topics Concern   Not on file  Social History Narrative   Not on file   Social Drivers of Health   Financial Resource Strain: Not on file  Food Insecurity: No Food Insecurity (08/08/2023)   Hunger Vital Sign    Worried About Running Out of Food in the Last Year: Never true    Ran Out of Food in the Last Year: Never true  Transportation Needs: No Transportation Needs (08/08/2023)   PRAPARE - Administrator, Civil Service (Medical): No    Lack of Transportation (Non-Medical): No  Physical Activity: Not on file  Stress: Not on file  Social Connections: Unknown (08/08/2023)   Social Connection and Isolation Panel [NHANES]    Frequency of Communication with Friends and Family: More than three times a week    Frequency of Social Gatherings with Friends and Family: More than three times a week    Attends Religious Services: Not on file    Active Member of Clubs or Organizations: Not on file    Attends Banker Meetings: Not on file    Marital Status: Living with partner  Intimate Partner Violence: Not At Risk (08/08/2023)   Humiliation, Afraid, Rape, and Kick questionnaire    Fear of Current or Ex-Partner: No    Emotionally Abused: No    Physically Abused: No    Sexually Abused: No    Family History:   Family History  Problem Relation Age of Onset   Hypertension Mother    Alcohol abuse Mother    Deep vein thrombosis Mother    Alcohol abuse Father    COPD Father    Emphysema Father    Heart disease  Father     ROS:  Please see the history of present illness.  All other ROS reviewed and negative.     Physical Exam/Data: Vitals:   09/01/23 1920 09/01/23 1939 09/01/23 2045  BP: 111/81  102/68  Pulse: (!) 129  (!) 121  Resp: 19  (!) 21  Temp: 99.4 F (37.4 C)    TempSrc: Oral    SpO2: 96%  99%  Weight:  113.6 kg   Height:  5\' 7"  (1.702 m)    No intake or output data in the 24 hours ending 09/01/23 2113    09/01/2023    7:39 PM 08/25/2023    3:07 PM 08/17/2023    1:25 PM  Last 3 Weights  Weight (lbs) 250 lb 7.1 oz 250 lb 9.6 oz 247 lb 6.4 oz  Weight (kg) 113.6 kg 113.671 kg 112.22 kg     Body mass index is 39.22 kg/m.  General:  Uncomfortable HEENT: normal Neck: no JVD Cardiac:  normal S1, S2; RRR; no murmur Lungs:  clear to auscultation bilaterally, no wheezing, rhonchi or rales  Ext: no edema Musculoskeletal:  No deformities, BUE and BLE strength normal and equal Skin: warm and dry, median sternotomy incision well-healed, anterior chest incision appears healed without e/o infection; left lateral incision well-healed; the xiphoid incision has not healed with appearance of purulent drainage mixed with blood leaving from the incision site; Neuro:  grossly oriented, alert and awake         EKG:  The EKG was personally reviewed and demonstrates:  sinus tachycardia Telemetry:  Telemetry was personally reviewed and demonstrates:  sinus tachycardia  Laboratory Data: High Sensitivity Troponin:  No results for input(s): "TROPONINIHS" in the last 720 hours.   ChemistryNo results for input(s): "NA", "K", "CL", "CO2", "GLUCOSE", "BUN", "CREATININE", "CALCIUM ", "MG", "GFRNONAA", "GFRAA", "ANIONGAP" in the last 168 hours.  No results for input(s): "PROT", "ALBUMIN ", "AST", "ALT", "ALKPHOS", "BILITOT" in the last 168 hours. Lipids No results for input(s): "CHOL", "TRIG", "HDL", "LABVLDL", "LDLCALC", "CHOLHDL" in the last 168 hours.  Hematology Recent Labs  Lab 09/01/23 2050   WBC 25.3*  RBC 5.19*  HGB 15.4*  HCT 46.3*  MCV 89.2  MCH 29.7  MCHC 33.3  RDW 13.2  PLT 217   Thyroid  No results for input(s): "TSH", "FREET4" in the last 168 hours.  BNPNo results for input(s): "BNP", "PROBNP" in the last 168 hours.  DDimer No results for input(s): "DDIMER" in the last 168 hours.  Radiology/Studies:  DG Chest Portable 1 View Result Date: 09/01/2023 CLINICAL DATA:  Postop.  Defibrillator placed 5/5 EXAM: PORTABLE CHEST 1 VIEW COMPARISON:  Most recent radiograph 08/09/2023 FINDINGS: Stable positioning of subcutaneous pacemaker with battery pack on the left. Prior median sternotomy. Stable heart size and mediastinal contours. Mild atelectasis at the left lung base. No acute airspace disease. No pleural fluid or pneumothorax. IMPRESSION: 1. Stable positioning of subcutaneous pacemaker. No pneumothorax. 2. Mild left basilar atelectasis. Electronically Signed   By: Chadwick Colonel M.D.   On: 09/01/2023 21:11    Assessment and Plan: 38 y.o. female with a hx of ICM HFrEF, CAD s/p CABG, HLD, recent SQ ICD placement 08/08/23 and lead revision 08/09/23 who is being seen 09/01/2023 for the evaluation of anterior chest and lateral chest pain over areas of incision at the request of Dr. Yvonne Hering.  #Concern for SQ ICD device infection Tenderness along the anterior chest and left lateral chest incision with a xiphoid incision that appears to have purulent drainage coming from it. Severe leukocytosis to 25 with sinus tachy and low-grade fever, raising concern for worsening infection with unclear extent. Recommend CT with contrast to assess full extent of possible infection (?abscess). Would also perform broad infectious workup to rule out other causes of infection.  - Blood cultures x2 and UA - Please send off wound culture of the xiphoid fluid - Please obtain CT with contrast - Please keep NPO at MN in case need for device removal tomorrow - Agree with continuing broad spectrum  antibiotics for now  Risk Assessment/Risk Scores:       For questions or updates, please contact [facility] Please consult [email or web address] for contact info under    Signed, Aubrey Leaf, MD  09/01/2023 9:13 PM

## 2023-09-02 ENCOUNTER — Inpatient Hospital Stay (HOSPITAL_COMMUNITY): Payer: MEDICAID

## 2023-09-02 DIAGNOSIS — R000 Tachycardia, unspecified: Secondary | ICD-10-CM

## 2023-09-02 DIAGNOSIS — T8140XA Infection following a procedure, unspecified, initial encounter: Secondary | ICD-10-CM | POA: Diagnosis not present

## 2023-09-02 DIAGNOSIS — T8149XA Infection following a procedure, other surgical site, initial encounter: Secondary | ICD-10-CM

## 2023-09-02 LAB — CBC WITH DIFFERENTIAL/PLATELET
Abs Immature Granulocytes: 0.12 10*3/uL — ABNORMAL HIGH (ref 0.00–0.07)
Basophils Absolute: 0.1 10*3/uL (ref 0.0–0.1)
Basophils Relative: 0 %
Eosinophils Absolute: 0.5 10*3/uL (ref 0.0–0.5)
Eosinophils Relative: 3 %
HCT: 40.2 % (ref 36.0–46.0)
Hemoglobin: 13.4 g/dL (ref 12.0–15.0)
Immature Granulocytes: 1 %
Lymphocytes Relative: 11 %
Lymphs Abs: 2 10*3/uL (ref 0.7–4.0)
MCH: 29.7 pg (ref 26.0–34.0)
MCHC: 33.3 g/dL (ref 30.0–36.0)
MCV: 89.1 fL (ref 80.0–100.0)
Monocytes Absolute: 1.3 10*3/uL — ABNORMAL HIGH (ref 0.1–1.0)
Monocytes Relative: 7 %
Neutro Abs: 13.9 10*3/uL — ABNORMAL HIGH (ref 1.7–7.7)
Neutrophils Relative %: 78 %
Platelets: 172 10*3/uL (ref 150–400)
RBC: 4.51 MIL/uL (ref 3.87–5.11)
RDW: 13.4 % (ref 11.5–15.5)
WBC: 17.9 10*3/uL — ABNORMAL HIGH (ref 4.0–10.5)
nRBC: 0 % (ref 0.0–0.2)

## 2023-09-02 LAB — BASIC METABOLIC PANEL WITH GFR
Anion gap: 11 (ref 5–15)
BUN: 6 mg/dL (ref 6–20)
CO2: 16 mmol/L — ABNORMAL LOW (ref 22–32)
Calcium: 8.6 mg/dL — ABNORMAL LOW (ref 8.9–10.3)
Chloride: 106 mmol/L (ref 98–111)
Creatinine, Ser: 0.6 mg/dL (ref 0.44–1.00)
GFR, Estimated: >60 mL/min (ref >60)
Glucose, Bld: 94 mg/dL (ref 70–99)
Potassium: 3.4 mmol/L — ABNORMAL LOW (ref 3.5–5.1)
Sodium: 133 mmol/L — ABNORMAL LOW (ref 135–145)

## 2023-09-02 LAB — SEDIMENTATION RATE: Sed Rate: 18 mm/h (ref 0–22)

## 2023-09-02 LAB — C-REACTIVE PROTEIN: CRP: 23.6 mg/dL — ABNORMAL HIGH (ref <1.0)

## 2023-09-02 LAB — URINALYSIS, W/ REFLEX TO CULTURE (INFECTION SUSPECTED)
Bilirubin Urine: NEGATIVE
Glucose, UA: >=500 mg/dL — AB
Ketones, ur: 5 mg/dL — AB
Leukocytes,Ua: NEGATIVE
Nitrite: NEGATIVE
Protein, ur: NEGATIVE mg/dL
Specific Gravity, Urine: 1.038 — ABNORMAL HIGH (ref 1.005–1.030)
pH: 5 (ref 5.0–8.0)

## 2023-09-02 LAB — MAGNESIUM: Magnesium: 1.8 mg/dL (ref 1.7–2.4)

## 2023-09-02 LAB — HIV ANTIBODY (ROUTINE TESTING W REFLEX): HIV Screen 4th Generation wRfx: NONREACTIVE

## 2023-09-02 LAB — MRSA NEXT GEN BY PCR, NASAL: MRSA by PCR Next Gen: NOT DETECTED

## 2023-09-02 LAB — TSH: TSH: 0.022 u[IU]/mL — ABNORMAL LOW (ref 0.350–4.500)

## 2023-09-02 LAB — T4, FREE: Free T4: 1.12 ng/dL (ref 0.61–1.12)

## 2023-09-02 MED ORDER — VANCOMYCIN HCL IN DEXTROSE 1-5 GM/200ML-% IV SOLN
1000.0000 mg | Freq: Two times a day (BID) | INTRAVENOUS | Status: DC
  Administered 2023-09-02 – 2023-09-06 (×9): 1000 mg via INTRAVENOUS
  Filled 2023-09-02 (×10): qty 200

## 2023-09-02 MED ORDER — SODIUM CHLORIDE 0.9 % IV BOLUS
500.0000 mL | Freq: Once | INTRAVENOUS | Status: AC
  Administered 2023-09-02: 500 mL via INTRAVENOUS

## 2023-09-02 MED ORDER — ASPIRIN 81 MG PO CHEW
81.0000 mg | CHEWABLE_TABLET | Freq: Every day | ORAL | Status: DC
  Administered 2023-09-02 – 2023-09-06 (×4): 81 mg via ORAL
  Filled 2023-09-02 (×4): qty 1

## 2023-09-02 MED ORDER — IOHEXOL 350 MG/ML SOLN
75.0000 mL | Freq: Once | INTRAVENOUS | Status: AC | PRN
  Administered 2023-09-02: 75 mL via INTRAVENOUS

## 2023-09-02 MED ORDER — ASPIRIN 81 MG PO TBEC
81.0000 mg | DELAYED_RELEASE_TABLET | Freq: Every day | ORAL | Status: DC
  Filled 2023-09-02: qty 1

## 2023-09-02 MED ORDER — POTASSIUM CHLORIDE 20 MEQ PO PACK
40.0000 meq | PACK | Freq: Once | ORAL | Status: AC
  Administered 2023-09-02: 40 meq via ORAL
  Filled 2023-09-02: qty 2

## 2023-09-02 MED ORDER — UBROGEPANT 100 MG PO TABS: 1.0000 | ORAL_TABLET | ORAL | Status: DC

## 2023-09-02 MED ORDER — HYDROMORPHONE HCL 1 MG/ML IJ SOLN
0.5000 mg | INTRAMUSCULAR | Status: DC | PRN
  Administered 2023-09-05: .5 mg via INTRAVENOUS
  Administered 2023-09-05: 0.5 mg via INTRAVENOUS
  Administered 2023-09-05: .5 mg via INTRAVENOUS
  Administered 2023-09-06 (×2): 0.5 mg via INTRAVENOUS
  Filled 2023-09-02 (×3): qty 1

## 2023-09-02 MED ORDER — OXYCODONE HCL 5 MG PO TABS
5.0000 mg | ORAL_TABLET | ORAL | Status: DC | PRN
  Administered 2023-09-02 – 2023-09-03 (×3): 5 mg via ORAL
  Filled 2023-09-02 (×4): qty 1

## 2023-09-02 MED ORDER — SODIUM CHLORIDE 0.9 % IV SOLN
2.0000 g | Freq: Three times a day (TID) | INTRAVENOUS | Status: DC
  Administered 2023-09-02 – 2023-09-06 (×13): 2 g via INTRAVENOUS
  Filled 2023-09-02 (×14): qty 12.5

## 2023-09-02 MED ORDER — DAPAGLIFLOZIN PROPANEDIOL 10 MG PO TABS: 10.0000 mg | ORAL_TABLET | Freq: Every day | ORAL | Status: DC

## 2023-09-02 NOTE — Progress Notes (Incomplete)
 New Admission Note:   Arrival Method: Arrived from [facility] ED via stretcher Mental Orientation: Alert and oriented x4 Telemetry: Box #15 Assessment: Completed Skin: See LDA IV:  Pain:  Tubes: N/A Safety Measures: Safety Fall Prevention Plan has been discussed.  Admission: Completed Orientation: Patient has been oriented to the room, unit and staff.  Family: None at bedside  Orders have been reviewed and implemented. Will continue to monitor the patient. Call light has been placed within reach and bed alarm has been activated.   Lile Mccurley [employer], RN-BC Phone number: [phone]

## 2023-09-02 NOTE — TOC CM/SW Note (Signed)
 Transition of Care [facility]) - Inpatient Brief Assessment   Patient Details  Name: Elizabeth Spencer MRN: [id number] Date of Birth: 1986-03-05  [facility]) CM/SW Contact:    Tom-Johnson, Angelique Ken, RN Phone Number: 09/02/2023, 2:42 PM   Clinical Narrative:  Patient presented to the ED with incisional leaking/bleeding, Chest Wall pain, N/V, Fevers. Patient underwent Defibrillator placement on 08/08/23. WBC shows 25,000. CTA Chest showed site infection to the Anterior Chest Wall, vague Edema and thickening of the Medial Lt Pectoralis Major suggestive of Myositis. No drainable fluid collection. MRSA PCR negative. Admitted with Sepsis, on IV abx.   CM went to speak with patient at bedside. Noted to be agitated, requesting updates on tests and also c/o pain. RN notified. CM will assess at an appropriate time.   CM will continue to follow as patient progresses with care towards discharge.         Transition of Care Asessment:   Patient has primary care physician: Yes Home environment has been reviewed: Yes Prior level of function:: Independent Prior/Current Home Services: No current home services Social Drivers of Health Review: SDOH reviewed no interventions necessary Readmission risk has been reviewed: Yes Transition of care needs: no transition of care needs at this time

## 2023-09-02 NOTE — Hospital Course (Addendum)
 PMH of CAD SP CABG, type II DM, HLD, HTN, HFrEF NICM SP ICD implant, PTSD, hyperthyroidism on methimazole  present to the hospital with nonhealing wound after ICD lead revision with nausea vomiting and fever. Currently being admitted for sepsis secondary to wound infection. EP following. Currently recommending conservative measures with IV antibiotics. Assessment and Plan: Sepsis secondary to surgical site infection. Recent ICD placement. Presented with fever nausea vomiting.  Had leukocytosis.  Met SIRS criteria. Receiving IV antibiotics. Blood cultures so far no growth. MRSA PCR negative which is reassuring. Given surgical intervention I would still continue IV vancomycin  and IV cefepime . CT chest negative for any drainable abscess but likely has skin seroma. EP recommending removal of the pacemaker. Follow-up on procedure report and recommendation cardiology with regards to antibiotic duration.  Ischemic cardiomyopathy SP ICD implant HFrEF CAD CABG Management per cardiology.  Currently no evidence of active angina. Volume status adequate as well. Recent EF 25 to 30%. Continue Toprol -XL.  Hyperthyroidism. On methimazole  daily. For now continue.  Type 2 diabetes mellitus, well-controlled without long-term insulin  use. On Farxiga  currently on hold.  Anxiety. On scheduled long-acting Xanax  at home. Currently on Xanax  as needed.  Pain control. Patient has concerns with regards to use of oxycodone . Educated on drug interactions. Systolic blood pressure of 90 and heart rate of 50 would be adequate to utilize oral pain medication that is ordered.  Obesity class I. Body mass index is 39.22 kg/m.  Placing the patient at high risk for poor outcome.  HLD. Continuing statin.  Hypokalemia. Replaced orally.   Chronic nausea. Requesting nausea medication on discharge.

## 2023-09-02 NOTE — Consult Note (Addendum)
 ELECTROPHYSIOLOGY CONSULT NOTE    Patient ID: Elizabeth Spencer MRN: [id number], DOB/AGE: 06/29/1985 38 y.o.  Admit date: 09/01/2023 Date of Consult: 09/02/2023  Primary Physician: Trenda Frisk, FNP Primary Cardiologist: Sammy Crisp, MD  Electrophysiologist: Dr. Daneil Dunker   Referring Provider: Dr. Lydia Sams   Patient Profile: Elizabeth Spencer is a 38 y.o. female with a history of ICM, HFrEF s/p SQ-ICD, CAD s/p CABG, HTN, HLD, obesity, DM II, asthma, migraines, hypothyroidism, PTSD, anxiety who is being seen today for the evaluation of wound infection at the request of Dr. Lydia Sams.  HPI:  Elizabeth Spencer is a 38 y.o. female who presented to [facility] on 09/01/23 with reports of bleeding from her ICD site.    She reports she has noted an odor, drainage, tenderness at the site and fevers.  On arrival, her temp was 99.9, WBC 25k, negative lactic acid, stable hemodynamics.  She was treated with IVF, IV abx > cefepime / vancomycin .  TRH admitted for infectious work up. CTA Chest demonstrated on surgical site infection in the anterior chest wall, vague edema and thickening of the medial left pectoralis major suggestive of myositis. No drainable fluid collection.   She denies chest pain, palpitations, dyspnea, PND, orthopnea, nausea, vomiting, dizziness, syncope, edema, weight gain, or early satiety.   Labs Potassium3.4* (05/30 1037) Magnesium   1.8 (05/30 1037) Creatinine, ser  0.60 (05/30 1037) PLT  172 (05/30 1037) HGB  13.4 (05/30 1037) WBC 17.9* (05/30 1037) Troponin I (High Sensitivity)5 (05/29 2245).    Past Medical History:  Diagnosis Date   Aneurysm (HCC) 01/04/2014   Arthritis    Asthma    WELL CONTROLLED   Brain aneurysm 2007   NEUROLOGY NOTE DOES NOT MENTION ANEURYSM BUT PT STATES SHE DID NOT HAVE TO HAVE SURGERY   Complication of anesthesia    FOR 1 C-SECTION PT WAS ITCHING AND VERY RED ON HER FACE   Cough, persistent 01/27/2016   Dermatitis due to sunburn 11/08/2013   Family  history of adverse reaction to anesthesia    brother, neice and nephew got red in face with hives   Gallstones    GERD (gastroesophageal reflux disease)    Gonorrhea 06/20/2020   Headache    CHRONIC HEADACHES   History of chronic cough    DRY   Loss of memory 05/30/2017   Perforation of left tympanic membrane 05/17/2013   Pneumonia    PTSD (post-traumatic stress disorder)    PTSD (post-traumatic stress disorder)    Thyroid  condition    PT WAS JUST TOLD ON 08-25-17 THAT SHE HAS A THYROID  PROBLEM AND IS GOING TO F/U WITH ENDOCRINOLOGIST IN 2 WEEKS   Trichomonas vaginalis (TV) infection 06/20/2020     Surgical History:  Past Surgical History:  Procedure Laterality Date   ABDOMINAL HYSTERECTOMY     CESAREAN SECTION     x3   CORONARY ARTERY BYPASS GRAFT N/A 09/02/2022   Procedure: CORONARY ARTERY BYPASS GRAFTING (CABG) X 1 USING LEFT INTERNAL MAMMARY ARTERY;  Surgeon: Bartley Lightning, MD;  Location: [facility] OR;  Service: Open Heart Surgery;  Laterality: N/A;   CYSTOSCOPY N/A 09/29/2017   Procedure: CYSTOSCOPY;  Surgeon: Darl Edu, MD;  Location: [facility] ORS;  Service: Gynecology;  Laterality: N/A;   CYSTOSCOPY N/A 11/05/2017   Procedure: CYSTOSCOPY;  Surgeon: Darl Edu, MD;  Location: [facility] ORS;  Service: Gynecology;  Laterality: N/A;   HYSTEROSCOPY WITH D & C N/A 09/01/2017   Procedure: DILATATION AND CURETTAGE /HYSTEROSCOPY;  Surgeon:  Darl Edu, MD;  Location: [facility] ORS;  Service: Gynecology;  Laterality: N/A;   LAPAROSCOPY N/A 09/01/2017   Procedure: LAPAROSCOPY OPERATIVE with biopsy;  Surgeon: Darl Edu, MD;  Location: [facility] ORS;  Service: Gynecology;  Laterality: N/A;   LAPAROSCOPY N/A 11/05/2017   Procedure: LAPAROSCOPY DIAGNOSTIC;  Surgeon: Darl Edu, MD;  Location: [facility] ORS;  Service: Gynecology;  Laterality: N/A;   LEFT HEART CATH AND CORONARY ANGIOGRAPHY N/A 09/01/2022   Procedure: LEFT HEART CATH AND CORONARY ANGIOGRAPHY;  Surgeon: Sammy Crisp, MD;   Location: [facility] INVASIVE CV LAB;  Service: Cardiovascular;  Laterality: N/A;   REPAIR VAGINAL CUFF N/A 11/05/2017   Procedure: REPAIR VAGINAL CUFF;  Surgeon: Darl Edu, MD;  Location: [facility] ORS;  Service: Gynecology;  Laterality: N/A;   SUBQ ICD IMPLANT N/A 08/08/2023   Procedure: SUBQ ICD IMPLANT;  Surgeon: [name], MD;  Location: [facility] INVASIVE CV LAB;  Service: Cardiovascular;  Laterality: N/A;   SUBQ ICD REVISION N/A 08/09/2023   Procedure: SUBQ ICD REVISION;  Surgeon: [name], MD;  Location: [facility] INVASIVE CV LAB;  Service: Cardiovascular;  Laterality: N/A;   TEE WITHOUT CARDIOVERSION N/A 09/02/2022   Procedure: TRANSESOPHAGEAL ECHOCARDIOGRAM;  Surgeon: Bartley Lightning, MD;  Location: [facility] OR;  Service: Open Heart Surgery;  Laterality: N/A;   TOTAL LAPAROSCOPIC HYSTERECTOMY WITH SALPINGECTOMY Bilateral 09/29/2017   Procedure: TOTAL LAPAROSCOPIC HYSTERECTOMY WITH SALPINGECTOMY;  Surgeon: Darl Edu, MD;  Location: [facility] ORS;  Service: Gynecology;  Laterality: Bilateral;     Medications Prior to Admission  Medication Sig Dispense Refill Last Dose/Taking   acetaminophen  (TYLENOL ) 500 MG tablet Take 1,000 mg by mouth every 6 (six) hours as needed.   09/01/2023 Noon   ALPRAZolam  (ALPRAZOLAM  XR) 0.5 MG 24 hr tablet TAKE 1 TABLET BY MOUTH TWICE DAILY AS NEEDED FOR ANXIETY OR SLEEP (Patient taking differently: Take 0.5 mg by mouth 2 (two) times daily as needed for anxiety.) 60 tablet 2 09/01/2023 Noon   aspirin  81 MG chewable tablet Chew 1 tablet (81 mg total) by mouth daily. 90 tablet 0 09/01/2023 Morning   atorvastatin  (LIPITOR) 40 MG tablet Take 1 tablet (40 mg total) by mouth daily. 90 tablet 3 09/01/2023 Morning   Biotin w/ Vitamins C & E (HAIR SKIN & NAILS GUMMIES PO) Take 1 tablet by mouth daily. Biotin   Past Month   cetirizine  (ZYRTEC ) 10 MG tablet Take 1 tablet (10 mg total) by mouth daily. (Patient taking differently: Take 10 mg by mouth at bedtime.) 90 tablet 1 08/31/2023 Bedtime    clopidogrel  (PLAVIX ) 75 MG tablet Take 1 tablet (75 mg total) by mouth daily. 90 tablet 3 09/01/2023 at  8:30 AM   dapagliflozin  propanediol (FARXIGA ) 10 MG TABS tablet Take 1 tablet (10 mg total) by mouth daily before breakfast. 90 tablet 1 09/01/2023 Morning   diphenhydrAMINE  (BENADRYL ) 25 MG tablet Take 50 mg by mouth every 6 (six) hours as needed (allergic reaction). Dye and glutin free   Past Month   EPIPEN  2-PAK 0.3 MG/0.3ML SOAJ injection INJECT INTO THIGH MUSCLE THROUGH CLOTHES AS NEEDED SEVERE ALLERGIC REACTION 2 each 1 Unknown   fluticasone  (FLONASE ) 50 MCG/ACT nasal spray 1 spray by Each Nare route daily. (Patient taking differently: Place 1 spray into both nostrils daily as needed for allergies.) 16 g 6 Unknown   furosemide  (LASIX ) 20 MG tablet Take 1 tablet (20 mg total) by mouth daily as needed. 30 tablet 2 Past Month   methimazole  (TAPAZOLE ) 10 MG tablet Take 1 tablet (10 mg  total) by mouth daily. (Patient taking differently: Take 15 mg by mouth daily.) 90 tablet 1 09/01/2023 Morning   metoprolol  succinate (TOPROL  XL) 25 MG 24 hr tablet Take 1 tablet (25 mg total) by mouth daily. 90 tablet 3 09/01/2023 Morning   montelukast  (SINGULAIR ) 10 MG tablet Take 1 tablet (10 mg total) by mouth at bedtime. (Patient taking differently: Take 10 mg by mouth in the morning.) 90 tablet 1 09/01/2023 Morning   omeprazole  (PRILOSEC) 20 MG capsule Take 1 capsule (20 mg total) by mouth 2 (two) times daily. 180 capsule 1 09/01/2023 Morning   ondansetron  (ZOFRAN -ODT) 4 MG disintegrating tablet Take 1 tablet (4 mg total) by mouth every 8 (eight) hours as needed for nausea or vomiting. 20 tablet 0 09/01/2023 Morning   spironolactone  (ALDACTONE ) 25 MG tablet Take 1 tablet (25 mg total) by mouth daily. 90 tablet 3 09/01/2023 Morning   traMADol  (ULTRAM ) 50 MG tablet Take 1 tablet (50 mg total) by mouth every 8 (eight) hours. 30 tablet 0 09/01/2023 Morning   Ubrogepant  100 MG TABS Take 1 tablet by mouth See admin  instructions. Take one tablet by mouth at onset of migraine. May take an additional tablet two hours later if migraine persists.  No more than 2 tablets in 24 hours.   Past Month    Inpatient Medications:   aspirin   81 mg Oral Daily   atorvastatin   40 mg Oral Daily   clopidogrel   75 mg Oral Daily   [START ON 09/04/2023] dapagliflozin  propanediol  10 mg Oral QAC breakfast   enoxaparin  (LOVENOX ) injection  40 mg Subcutaneous Q24H   loratadine   10 mg Oral QHS   methimazole   15 mg Oral Daily   metoprolol  succinate  25 mg Oral Daily   montelukast   10 mg Oral Daily   pantoprazole   40 mg Oral Daily   potassium chloride   40 mEq Oral Once   Ubrogepant   1 tablet Oral See admin instructions    Allergies:  Allergies  Allergen Reactions   Azithromycin Swelling and Other (See Comments)   Clindamycin  Swelling   Codeine Hives and Other (See Comments)    Nausea/dizzy    Meloxicam  Other (See Comments)    Burns up from the inside    Neurontin [Gabapentin] Other (See Comments)    Makes her pass out and not remember what happened prior to taking med.   Penicillins Anaphylaxis    Patient allergic to all "cillins" Has patient had a PCN reaction causing immediate rash, facial/tongue/throat swelling, SOB or lightheadedness with hypotension: Yes Has patient had a PCN reaction causing severe rash involving mucus membranes or skin necrosis: Yes Has patient had a PCN reaction that required hospitalization: No Has patient had a PCN reaction occurring within the last 10 years: Yes If all of the above answers are "NO", then may proceed with Cephalosporin use.    Clindamycin /Lincomycin Swelling   Other Hives and Other (See Comments)    Berry flavored food and drinks   Diphenhydramine  Hcl Rash and Other (See Comments)    Uses dye-free; allergic to dye in regular benadryl    Doxycycline Rash   Egg-Derived Products Rash    Family History  Problem Relation Age of Onset   Hypertension Mother    Alcohol  abuse Mother    Deep vein thrombosis Mother    Alcohol abuse Father    COPD Father    Emphysema Father    Heart disease Father      Physical Exam: Vitals:  09/02/23 0121 09/02/23 0303 09/02/23 0600 09/02/23 0852  BP: 95/71 96/77 (!) 101/44 109/62  Pulse: (!) 122 (!) 107 (!) 117 (!) 106  Resp: 20 18 20    Temp: 98.3 F (36.8 C) 98.9 F (37.2 C) 98 F (36.7 C) 98.2 F (36.8 C)  TempSrc:  Oral Oral   SpO2: 97% 93% 95% 93%  Weight:      Height:        GEN- NAD, A&O x 3, normal affect HEENT: Normocephalic, atraumatic Lungs- CTAB, Normal effort.  Heart- Regular rate and rhythm, No M/G/R.  GI- Soft, NT, ND.  Extremities- No clubbing, cyanosis, or edema Skin: left chest S-ICD site well healed, substernal wound largely closed, small area (~29mm) mid-left of incision that drains with pressure on site. Area is firm, warm to touch.         Radiology/Studies: CT Angio Chest PE W and/or Wo Contrast Result Date: 09/02/2023 CLINICAL DATA:  Chest wall abscess, tachycardia, fever. Drainage from chest wall at defibrillator incision site. EXAM: CT ANGIOGRAPHY CHEST WITH CONTRAST TECHNIQUE: Multidetector CT imaging of the chest was performed using the standard protocol during bolus administration of intravenous contrast. Multiplanar CT image reconstructions and MIPs were obtained to evaluate the vascular anatomy. RADIATION DOSE REDUCTION: This exam was performed according to the departmental dose-optimization program which includes automated exposure control, adjustment of the mA and/or kV according to patient size and/or use of iterative reconstruction technique. CONTRAST:  75mL OMNIPAQUE  IOHEXOL  350 MG/ML SOLN COMPARISON:  CTA chest 08/31/2022 and radiographs 09/01/2023 FINDINGS: Cardiovascular: Left posterior chest wall defibrillator. Streak artifact limits assessment. No definite fluid collection at the defibrillator. There is mild stranding along the course of the defibrillator lead.  Thickening and vague edema within the medial left pectoralis major muscle at the tip of the defibrillator lead (circa series 7/image 103). Adjacent subcutaneous fat stranding and free fluid in the subcutaneous fat of the anterior chest wall suggestive of surgical site infection. No drainable fluid collection or soft tissue gas. Normal heart size. No pericardial effusion. Sternotomy and CABG. Normal caliber thoracic aorta. Mediastinum/Nodes: Multiple thyroid  nodules. The largest measures 2.2 cm in the right thyroid  lobe. Trachea and esophagus are unremarkable. No evidence of mediastinal fluid or abscess. Lungs/Pleura: Bibasilar atelectasis/scarring. No focal consolidation, pleural effusion, or pneumothorax. Upper Abdomen: No acute abnormality. Musculoskeletal: No acute fracture Review of the MIP images confirms the above findings. IMPRESSION: 1. Findings compatible with surgical site infection in the anterior chest wall. Vague edema and thickening of the medial left pectoralis major muscle suggest myositis. No drainable fluid collection. 2. Multiple thyroid  nodules. The largest measures 2.2 cm in the right thyroid  lobe. Recommend nonemergent thyroid  US  (ref: J Am Coll Radiol. 2015 Feb;12(2): 143-50). Electronically Signed   By: Rozell Cornet M.D.   On: 09/02/2023 01:01   DG Chest Portable 1 View Result Date: 09/01/2023 CLINICAL DATA:  Postop.  Defibrillator placed 5/5 EXAM: PORTABLE CHEST 1 VIEW COMPARISON:  Most recent radiograph 08/09/2023 FINDINGS: Stable positioning of subcutaneous pacemaker with battery pack on the left. Prior median sternotomy. Stable heart size and mediastinal contours. Mild atelectasis at the left lung base. No acute airspace disease. No pleural fluid or pneumothorax. IMPRESSION: 1. Stable positioning of subcutaneous pacemaker. No pneumothorax. 2. Mild left basilar atelectasis. Electronically Signed   By: Chadwick Colonel M.D.   On: 09/01/2023 21:11   EP PPM/ICD IMPLANT Result Date:  08/10/2023  CONCLUSIONS:  1. Successful subcutaneous ICD lead revision, utilizing a 2 incision technique. The distal  lead tip was secured to the fascia.  2. No early apparent complications. [name], MD, [facility], [facility] 08/10/2023 7:31 AM   DG Chest 1V REPEAT Same Day Result Date: 08/09/2023 CLINICAL DATA:  [id number] ICD (implantable cardioverter-defibrillator) in place [id number]. EXAM: CHEST - 1 VIEW SAME DAY COMPARISON:  08/09/2023, 3:27 p.m. FINDINGS: Lateral views of the chest. Neurostimulator device battery pack seen overlying the posterior lower chest with the lead extending anteriorly along the sternum. IMPRESSION: *Neurostimulator device battery pack seen overlying the posterior lower chest with the lead extending anteriorly along the sternum. Electronically Signed   By: Beula Brunswick M.D.   On: 08/09/2023 16:50   DG CHEST PORT 1 VIEW Result Date: 08/09/2023 CLINICAL DATA:  [id number] ICD (implantable cardioverter-defibrillator) in place [id number] EXAM: PORTABLE CHEST 1 VIEW COMPARISON:  08/09/2023, 5:20 a.m. FINDINGS: Low lung volume. There are atelectatic changes at the lung bases, left more than right. Bilateral lung fields are otherwise clear. No acute consolidation or lung collapse. Bilateral costophrenic angles are clear. No pneumothorax. Stable cardio-mediastinal silhouette. Redemonstration of ICD with its battery pack overlying the left lower lateral chest wall. No acute osseous abnormalities. The soft tissues are within normal limits. IMPRESSION: *No active disease. *Redemonstration of ICD.  No pneumothorax. Electronically Signed   By: Beula Brunswick M.D.   On: 08/09/2023 15:36   DG Chest 2 View Result Date: 08/09/2023 CLINICAL DATA:  Cardiac device in-situ EXAM: CHEST - 2 VIEW COMPARISON:  May 25, 2023 FINDINGS: Prior median sternotomy. Pericardial pacer device appears in anatomic alignment. No pneumothorax Heart normal size No infiltrates or consolidations. IMPRESSION: No active cardiopulmonary  disease. Electronically Signed   By: Fredrich Jefferson M.D.   On: 08/09/2023 09:18   EP PPM/ICD IMPLANT Result Date: 08/08/2023  CONCLUSIONS:  1. NYHA class II chronic systolic heart failure.  2. Successful subcutaneous ICD implantation for primary prevention.  3. Successful DFT testing with single 65J shock.  4. No early apparent complications. [name], MD, [facility], [facility] 08/08/2023 6:14 PM    EKG:09/01/23 ST 138 bpm (personally reviewed)  TELEMETRY: SR 100 - ST 120's (personally reviewed)  DEVICE HISTORY: S-ICD placement 08/08/23, required lead revision 08/09/23  Assessment/Plan:  Sepsis secondary to S-ICD Incision Site Infection  ICM s/p S-ICD  HFrEF Tachycardia   CTA chest shows site infection in the anterior chest wall, vague edema and thickening of the medial left pectoralis major suggestive of myositis. No drainable fluid collection. MRSA PCR negative.   -continue broad spectrum abx  -pain control per TRH  -anticipate device will need to be removed given infection  -follow cultures, NGTD  -monitor fever curve / WBC trend (25k > 17.9k) -tachycardia most likely secondary to pain, infection    For questions or updates, please contact [facility] Please consult [email or web address] for contact info under     Signed, Creighton Doffing, NP-C, AGACNP-BC [facility] - Electrophysiology  09/02/2023, 1:58 PM

## 2023-09-02 NOTE — Plan of Care (Signed)

## 2023-09-02 NOTE — ED Notes (Signed)
 Pt refuses transport to ct scan and states "Im not going anywhere until I get something for pain and anxiety. Im so claustrophobic." Pt refused transport to CT a few minutes after receiving meds and states she must use restroom at this time. CT called to attempt scan for the third time.

## 2023-09-02 NOTE — ED Notes (Incomplete)
 CT tech arrived to transport pt to ct scan, pt states "I'm not going anywhere til I have something for pain and my nerves. Im so claustrophobic." Pt refuses transport to ct

## 2023-09-02 NOTE — Progress Notes (Signed)
 [facility] Progress Note Patient: Elizabeth Spencer OZH:[id number] DOB: Aug 15, 1985 DOA: 09/01/2023  DOS: the patient was seen and examined on 09/02/2023  Brief Hospital Course: PMH of CAD SP CABG, type II DM, HLD, HTN, HFrEF NICM SP ICD implant, PTSD, hyperthyroidism on methimazole  present to the hospital with nonhealing wound after ICD lead revision with nausea vomiting and fever. Currently being admitted for sepsis secondary to wound infection. EP following. Currently recommending conservative measures with IV antibiotics.  Assessment and Plan: Sepsis secondary to surgical site infection. Recent ICD placement.  Presented with fever nausea vomiting.  Had leukocytosis.  Met SIRS criteria. Receiving IV antibiotics. Blood cultures pending. MRSA PCR negative which is reassuring. Given surgical intervention I would still continue IV vancomycin  and IV cefepime. CT chest negative for any drainable abscess but likely has skin seroma. Will follow recommendation from EP.  Ischemic cardiomyopathy SP ICD implant HFrEF CAD CABG Management per cardiology.  Currently no evidence of active angina. Volume status adequate as well. Recent EF 25 to 30%. Holding Aldactone  in the setting of active infection Continue Toprol -XL.  Hyperthyroidism. On methimazole  daily. For now continue.  Type 2 diabetes mellitus, well-controlled without long-term insulin  use. On Farxiga  currently on hold.  Anxiety. On scheduled long-acting Xanax  at home. Currently on Xanax  as needed.  Pain control. Patient has concerns with regards to use of oxycodone . Educated on drug interactions. Currently on Dilaudid  and oxycodone . Monitor.  Obesity class I. Body mass index is 39.22 kg/m.  Placing the patient at high risk for poor outcome.  HLD. Continuing statin.  Hypokalemia. Replaced orally.    Subjective:   Continue to report severe chest pain .  No nausea no vomiting no fever no chills.  Breathing  difficulty secondary to pain.  Physical Exam: General: in moderate distress, No Rash Cardiovascular: S1 and S2 Present, No Murmur Respiratory: Good respiratory effort, Bilateral Air entry present. No Crackles, No wheezes Abdomen: Bowel Sound present, No tenderness Extremities: Bilateral edema Neuro: Alert and oriented x3, no new focal deficit  Data Reviewed: I have Reviewed nursing notes, Vitals, and Lab results. Since last encounter, pertinent lab results CBC and BMP   . I have ordered test including CBC and BMP  .   Disposition: Status is: Inpatient Remains inpatient appropriate because: Further workup per EP  enoxaparin  (LOVENOX ) injection 40 mg Start: 09/02/23 1000   Family Communication: No one at bedside Level of care: Telemetry Medical   Vitals:   09/02/23 0121 09/02/23 0303 09/02/23 0600 09/02/23 0852  BP: 95/71 96/77 (!) 101/44 109/62  Pulse: (!) 122 (!) 107 (!) 117 (!) 106  Resp: 20 18 20    Temp: 98.3 F (36.8 C) 98.9 F (37.2 C) 98 F (36.7 C) 98.2 F (36.8 C)  TempSrc:  Oral Oral   SpO2: 97% 93% 95% 93%  Weight:      Height:         Author: Charlean Congress, MD 09/02/2023 7:06 PM  Please look on [email or web address] to find out who is on call.

## 2023-09-02 NOTE — Progress Notes (Signed)
 Pharmacy Antibiotic Note  Elizabeth Spencer is a 38 y.o. female admitted on 09/01/2023 with sepsis, possible surgical site infection.  Pharmacy has been consulted for vancomycin  and cefepime dosing.  Plan: Cefepime 2g IV q8h Vancomycin  2g given in ED, then continue with 1g IV q12h for estimated AUC 492 using SCr 0.9, Vd 0.5 Check vancomycin  levels at steady state, goal AUC 400-550 Follow up renal function, cultures as available, clinical progress, length of tx  Height: 5\' 7"  (170.2 cm) Weight: 113.6 kg (250 lb 7.1 oz) IBW/kg (Calculated) : 61.6  Temp (24hrs), Avg:98.9 F (37.2 C), Min:98.3 F (36.8 C), Max:99.4 F (37.4 C)  Recent Labs  Lab 09/01/23 2050 09/01/23 2057 09/01/23 2250  WBC 25.3*  --   --   CREATININE 0.90  --   --   LATICACIDVEN  --  1.5 0.8    Estimated Creatinine Clearance: 108.1 mL/min (by C-G formula based on SCr of 0.9 mg/dL).    Allergies  Allergen Reactions   Azithromycin Swelling and Other (See Comments)   Clindamycin  Swelling   Codeine Hives and Other (See Comments)    Nausea/dizzy    Meloxicam  Other (See Comments)    Burns up from the inside    Neurontin [Gabapentin] Other (See Comments)    Makes her pass out and not remember what happened prior to taking med.   Penicillins Anaphylaxis    Patient allergic to all "cillins" Has patient had a PCN reaction causing immediate rash, facial/tongue/throat swelling, SOB or lightheadedness with hypotension: Yes Has patient had a PCN reaction causing severe rash involving mucus membranes or skin necrosis: Yes Has patient had a PCN reaction that required hospitalization: No Has patient had a PCN reaction occurring within the last 10 years: Yes If all of the above answers are "NO", then may proceed with Cephalosporin use.    Clindamycin /Lincomycin Swelling   Other Hives and Other (See Comments)    Berry flavored food and drinks   Diphenhydramine  Hcl Rash and Other (See Comments)    Uses dye-free; allergic  to dye in regular benadryl    Doxycycline Rash   Egg-Derived Products Rash    Antimicrobials this admission: 5/29 Vancomycin  >> 5/29 Cefepime >>  Dose adjustments this admission:  Microbiology results: 5/29 BCx: 5/29 Chest: MRSA PCR:  Thank you for allowing pharmacy to be a part of this patient's care.  Armanda Bern, PharmD, BCPS 09/02/2023 2:01 AM

## 2023-09-03 DIAGNOSIS — T827XXD Infection and inflammatory reaction due to other cardiac and vascular devices, implants and grafts, subsequent encounter: Secondary | ICD-10-CM

## 2023-09-03 DIAGNOSIS — A419 Sepsis, unspecified organism: Secondary | ICD-10-CM | POA: Diagnosis not present

## 2023-09-03 LAB — CBC WITH DIFFERENTIAL/PLATELET
Abs Immature Granulocytes: 0.04 10*3/uL (ref 0.00–0.07)
Basophils Absolute: 0.1 10*3/uL (ref 0.0–0.1)
Basophils Relative: 1 %
Eosinophils Absolute: 0.5 10*3/uL (ref 0.0–0.5)
Eosinophils Relative: 6 %
HCT: 40.8 % (ref 36.0–46.0)
Hemoglobin: 13.5 g/dL (ref 12.0–15.0)
Immature Granulocytes: 0 %
Lymphocytes Relative: 19 %
Lymphs Abs: 1.8 10*3/uL (ref 0.7–4.0)
MCH: 29.7 pg (ref 26.0–34.0)
MCHC: 33.1 g/dL (ref 30.0–36.0)
MCV: 89.9 fL (ref 80.0–100.0)
Monocytes Absolute: 0.9 10*3/uL (ref 0.1–1.0)
Monocytes Relative: 9 %
Neutro Abs: 6.2 10*3/uL (ref 1.7–7.7)
Neutrophils Relative %: 65 %
Platelets: 185 10*3/uL (ref 150–400)
RBC: 4.54 MIL/uL (ref 3.87–5.11)
RDW: 13.4 % (ref 11.5–15.5)
WBC: 9.6 10*3/uL (ref 4.0–10.5)
nRBC: 0 % (ref 0.0–0.2)

## 2023-09-03 LAB — COMPREHENSIVE METABOLIC PANEL WITH GFR
ALT: 18 U/L (ref 0–44)
AST: 15 U/L (ref 15–41)
Albumin: 2.9 g/dL — ABNORMAL LOW (ref 3.5–5.0)
Alkaline Phosphatase: 69 U/L (ref 38–126)
Anion gap: 8 (ref 5–15)
BUN: 7 mg/dL (ref 6–20)
CO2: 20 mmol/L — ABNORMAL LOW (ref 22–32)
Calcium: 8.8 mg/dL — ABNORMAL LOW (ref 8.9–10.3)
Chloride: 109 mmol/L (ref 98–111)
Creatinine, Ser: 0.71 mg/dL (ref 0.44–1.00)
GFR, Estimated: >60 mL/min (ref >60)
Glucose, Bld: 100 mg/dL — ABNORMAL HIGH (ref 70–99)
Potassium: 3.8 mmol/L (ref 3.5–5.1)
Sodium: 137 mmol/L (ref 135–145)
Total Bilirubin: 0.5 mg/dL (ref 0.0–1.2)
Total Protein: 6.3 g/dL — ABNORMAL LOW (ref 6.5–8.1)

## 2023-09-03 LAB — MAGNESIUM: Magnesium: 1.8 mg/dL (ref 1.7–2.4)

## 2023-09-03 MED ORDER — SODIUM CHLORIDE 0.9 % IV BOLUS
250.0000 mL | Freq: Once | INTRAVENOUS | Status: AC
  Administered 2023-09-03: 250 mL via INTRAVENOUS

## 2023-09-03 MED ORDER — PANTOPRAZOLE SODIUM 40 MG PO TBEC
40.0000 mg | DELAYED_RELEASE_TABLET | Freq: Every day | ORAL | Status: DC
  Administered 2023-09-03 – 2023-09-05 (×3): 40 mg via ORAL
  Filled 2023-09-03 (×3): qty 1

## 2023-09-03 MED ORDER — OXYCODONE HCL 5 MG PO TABS
5.0000 mg | ORAL_TABLET | ORAL | Status: DC | PRN
  Administered 2023-09-03 – 2023-09-06 (×12): 5 mg via ORAL
  Filled 2023-09-03 (×12): qty 1

## 2023-09-03 NOTE — Progress Notes (Addendum)
   Rounding Note    Patient Name: Elizabeth Spencer Date of Encounter: 09/03/2023  [facility] Cardiologist: Sammy Crisp, MD   Subjective   Tired this AM. Given IV fluid bolus this AM for systolic BP in 90s. This AM, visit conducted with bedside RN in room.  Vital Signs    Vitals:   09/03/23 0137 09/03/23 0243 09/03/23 0653 09/03/23 0754  BP: (!) 90/51 (!) 93/57 105/63 (!) 108/91  Pulse: 97 92 93 95  Resp: 20  19 19   Temp: 98 F (36.7 C)  98.3 F (36.8 C) 98.3 F (36.8 C)  TempSrc: Oral  Oral Oral  SpO2: 93% 95% 99% 94%  Weight:      Height:        Intake/Output Summary (Last 24 hours) at 09/03/2023 1044 Last data filed at 09/03/2023 1005 Gross per 24 hour  Intake 1202.04 ml  Output 0 ml  Net 1202.04 ml      09/01/2023    7:39 PM 08/25/2023    3:07 PM 08/17/2023    1:25 PM  Last 3 Weights  Weight (lbs) 250 lb 7.1 oz 250 lb 9.6 oz 247 lb 6.4 oz  Weight (kg) 113.6 kg 113.671 kg 112.22 kg       Physical Exam   GEN: No acute distress.   Cardiac: TTP at lower incision.  Respiratory: Clear to auscultation bilaterally. Psych: agitated.  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  Device will need to be removed early this week. Will need anesthesia support. Cont abx per primary team.  #ICM #HFrEF NYHA II this AM.  Cont metoprolol     [name], MD, [facility], [facility] Cardiac Electrophysiology

## 2023-09-03 NOTE — Progress Notes (Signed)
 [facility] Progress Note Patient: Elizabeth Spencer [id number]:[id number] DOB: 09-28-1985 DOA: 09/01/2023  DOS: the patient was seen and examined on 09/03/2023  Brief Hospital Course: PMH of CAD SP CABG, type II DM, HLD, HTN, HFrEF NICM SP ICD implant, PTSD, hyperthyroidism on methimazole  present to the hospital with nonhealing wound after ICD lead revision with nausea vomiting and fever. Currently being admitted for sepsis secondary to wound infection. EP following. Currently recommending conservative measures with IV antibiotics.  Assessment and Plan: Sepsis secondary to surgical site infection. Recent ICD placement.  Presented with fever nausea vomiting.  Had leukocytosis.  Met SIRS criteria. Receiving IV antibiotics. Blood cultures pending. MRSA PCR negative which is reassuring. Given surgical intervention I would still continue IV vancomycin  and IV cefepime . CT chest negative for any drainable abscess but likely has skin seroma. EP recommending removal of the pacemaker.  Will be scheduled sometime next week.  Continue with IV antibiotic.  Ischemic cardiomyopathy SP ICD implant HFrEF CAD CABG Management per cardiology.  Currently no evidence of active angina. Volume status adequate as well. Recent EF 25 to 30%. Holding Aldactone  in the setting of active infection Continue Toprol -XL.  Hyperthyroidism. On methimazole  daily. For now continue.  Type 2 diabetes mellitus, well-controlled without long-term insulin  use. On Farxiga  currently on hold.  Anxiety. On scheduled long-acting Xanax  at home. Currently on Xanax  as needed.  Pain control. Patient has concerns with regards to use of oxycodone . Educated on drug interactions. Currently on Dilaudid  and oxycodone . Monitor.  Systolic blood pressure of 90 and heart rate of 50 would be adequate to utilize oral pain medication that is ordered.  Obesity class I. Body mass index is 39.22 kg/m.  Placing the patient at high risk for  poor outcome.  HLD. Continuing statin.  Hypokalemia. Replaced orally.    Subjective: No nausea no vomiting no fever no chills.  Report the dressing at the substernal incision is not sticking.  Pain still present.  No diarrhea.  Physical Exam: General: in Mild distress, No Rash Cardiovascular: S1 and S2 Present, No Murmur.  No significant seroma seen, serosanguineous discharge at the substernal incision Respiratory: Good respiratory effort, Bilateral Air entry present. No Crackles, No wheezes Abdomen: Bowel Sound present, No tenderness Extremities: No edema Neuro: Alert and oriented x3, no new focal deficit  Data Reviewed: I have Reviewed nursing notes, Vitals, and Lab results. Since last encounter, pertinent lab results CBC and BMP   . I have ordered test including CBC and BMP  .   Disposition: Status is: Inpatient Remains inpatient appropriate because: Needing pacemaker removal  enoxaparin  (LOVENOX ) injection 40 mg Start: 09/02/23 1000   Family Communication: No one at bedside Level of care: Telemetry Medical   Vitals:   09/03/23 0243 09/03/23 0653 09/03/23 0754 09/03/23 1512  BP: (!) 93/57 105/63 (!) 108/91 (!) 110/93  Pulse: 92 93 95 100  Resp:  19 19   Temp:  98.3 F (36.8 C) 98.3 F (36.8 C) 98.6 F (37 C)  TempSrc:  Oral Oral Oral  SpO2: 95% 99% 94% 100%  Weight:      Height:         Author: Charlean Congress, MD 09/03/2023 7:47 PM  Please look on [email or web address] to find out who is on call.

## 2023-09-03 NOTE — Plan of Care (Signed)
 Messaged in regards to patient having blood pressure 90/51, but otherwise asymptomatic.  Orders placed for small 250 given 1 L bolus of IV fluids to try and maintain maps greater than 65 given heart failure history.

## 2023-09-04 DIAGNOSIS — A419 Sepsis, unspecified organism: Secondary | ICD-10-CM | POA: Diagnosis not present

## 2023-09-04 LAB — MAGNESIUM: Magnesium: 1.7 mg/dL (ref 1.7–2.4)

## 2023-09-04 LAB — CBC WITH DIFFERENTIAL/PLATELET
Abs Immature Granulocytes: 0.02 10*3/uL (ref 0.00–0.07)
Basophils Absolute: 0.1 10*3/uL (ref 0.0–0.1)
Basophils Relative: 1 %
Eosinophils Absolute: 0.6 10*3/uL — ABNORMAL HIGH (ref 0.0–0.5)
Eosinophils Relative: 7 %
HCT: 39 % (ref 36.0–46.0)
Hemoglobin: 12.9 g/dL (ref 12.0–15.0)
Immature Granulocytes: 0 %
Lymphocytes Relative: 25 %
Lymphs Abs: 2.1 10*3/uL (ref 0.7–4.0)
MCH: 29.7 pg (ref 26.0–34.0)
MCHC: 33.1 g/dL (ref 30.0–36.0)
MCV: 89.7 fL (ref 80.0–100.0)
Monocytes Absolute: 0.8 10*3/uL (ref 0.1–1.0)
Monocytes Relative: 9 %
Neutro Abs: 5.1 10*3/uL (ref 1.7–7.7)
Neutrophils Relative %: 58 %
Platelets: 181 10*3/uL (ref 150–400)
RBC: 4.35 MIL/uL (ref 3.87–5.11)
RDW: 13.2 % (ref 11.5–15.5)
WBC: 8.6 10*3/uL (ref 4.0–10.5)
nRBC: 0 % (ref 0.0–0.2)

## 2023-09-04 LAB — COMPREHENSIVE METABOLIC PANEL WITH GFR
ALT: 15 U/L (ref 0–44)
AST: 12 U/L — ABNORMAL LOW (ref 15–41)
Albumin: 2.7 g/dL — ABNORMAL LOW (ref 3.5–5.0)
Alkaline Phosphatase: 59 U/L (ref 38–126)
Anion gap: 10 (ref 5–15)
BUN: 6 mg/dL (ref 6–20)
CO2: 17 mmol/L — ABNORMAL LOW (ref 22–32)
Calcium: 8.6 mg/dL — ABNORMAL LOW (ref 8.9–10.3)
Chloride: 110 mmol/L (ref 98–111)
Creatinine, Ser: 0.6 mg/dL (ref 0.44–1.00)
GFR, Estimated: >60 mL/min (ref >60)
Glucose, Bld: 89 mg/dL (ref 70–99)
Potassium: 3.7 mmol/L (ref 3.5–5.1)
Sodium: 137 mmol/L (ref 135–145)
Total Bilirubin: 0.9 mg/dL (ref 0.0–1.2)
Total Protein: 5.8 g/dL — ABNORMAL LOW (ref 6.5–8.1)

## 2023-09-04 MED ORDER — NICOTINE 21 MG/24HR TD PT24
21.0000 mg | MEDICATED_PATCH | Freq: Every day | TRANSDERMAL | Status: DC
  Filled 2023-09-04: qty 1

## 2023-09-04 MED ORDER — NICOTINE 7 MG/24HR TD PT24
7.0000 mg | MEDICATED_PATCH | Freq: Every day | TRANSDERMAL | Status: DC
  Administered 2023-09-04: 7 mg via TRANSDERMAL
  Filled 2023-09-04 (×4): qty 1

## 2023-09-04 NOTE — Plan of Care (Signed)

## 2023-09-04 NOTE — Progress Notes (Signed)
 [facility] Progress Note Patient: Elizabeth Spencer:[id number] DOB: 1985/09/06 DOA: 09/01/2023  DOS: the patient was seen and examined on 09/04/2023  Brief Hospital Course: PMH of CAD SP CABG, type II DM, HLD, HTN, HFrEF NICM SP ICD implant, PTSD, hyperthyroidism on methimazole  present to the hospital with nonhealing wound after ICD lead revision with nausea vomiting and fever. Currently being admitted for sepsis secondary to wound infection. EP following. Currently recommending conservative measures with IV antibiotics. Assessment and Plan: Sepsis secondary to surgical site infection. Recent ICD placement.  Presented with fever nausea vomiting.  Had leukocytosis.  Met SIRS criteria. Receiving IV antibiotics. Blood cultures so far no growth. MRSA PCR negative which is reassuring. Given surgical intervention I would still continue IV vancomycin  and IV cefepime . CT chest negative for any drainable abscess but likely has skin seroma. EP recommending removal of the pacemaker.  N.p.o. after midnight.  Ischemic cardiomyopathy SP ICD implant HFrEF CAD CABG Management per cardiology.  Currently no evidence of active angina. Volume status adequate as well. Recent EF 25 to 30%. Continue Toprol -XL.  Hyperthyroidism. On methimazole  daily. For now continue.  Type 2 diabetes mellitus, well-controlled without long-term insulin  use. On Farxiga  currently on hold.  Anxiety. On scheduled long-acting Xanax  at home. Currently on Xanax  as needed.  Pain control. Patient has concerns with regards to use of oxycodone . Educated on drug interactions. Systolic blood pressure of 90 and heart rate of 50 would be adequate to utilize oral pain medication that is ordered.  Obesity class I. Body mass index is 39.22 kg/m.  Placing the patient at high risk for poor outcome.  HLD. Continuing statin.  Hypokalemia. Replaced orally.    Subjective: No nausea no vomiting no fever no chills.   Requesting nicotine  patch.  Physical Exam: Seen with RN. Clear to auscultation. S1-S2 present. No edema. Alert awake and oriented.  Data Reviewed: I have Reviewed nursing notes, Vitals, and Lab results. Since last encounter, pertinent lab results CBC and BMP   . I have ordered test including CBC and BMP  . I have discussed pt's care plan and test results with cardiology  .   Disposition: Status is: Inpatient Remains inpatient appropriate because: Monitor for further workup per cardiology  enoxaparin  (LOVENOX ) injection 40 mg Start: 09/02/23 1000   Family Communication: No one at bedside Level of care: Telemetry Medical   Vitals:   09/04/23 0742 09/04/23 0815 09/04/23 1215 09/04/23 1641  BP: 99/62 98/62 103/65 108/80  Pulse: 86  88 89  Resp: 19  19 19   Temp: 98.4 F (36.9 C)  97.7 F (36.5 C) 98.4 F (36.9 C)  TempSrc:   Oral   SpO2: 100%  100% 98%  Weight:      Height:         Author: Charlean Congress, MD 09/04/2023 5:55 PM  Please look on [email or web address] to find out who is on call.

## 2023-09-04 NOTE — Progress Notes (Signed)
   Rounding Note    Patient Name: NEITA LANDRIGAN Date of Encounter: 09/04/2023  [facility] Cardiologist: Sammy Crisp, MD   Subjective   Agitated this AM. Patient seen/examined with bedside RN in room.  Vital Signs    Vitals:   09/03/23 2042 09/04/23 0631 09/04/23 0742 09/04/23 0815  BP: 105/67 129/69 99/62 98/62   Pulse: 89 81 86   Resp: 18 18 19    Temp: 98.3 F (36.8 C) 98 F (36.7 C) 98.4 F (36.9 C)   TempSrc:      SpO2: 97% 97% 100%   Weight:      Height:        Intake/Output Summary (Last 24 hours) at 09/04/2023 0911 Last data filed at 09/03/2023 1700 Gross per 24 hour  Intake 660 ml  Output 0 ml  Net 660 ml      09/01/2023    7:39 PM 08/25/2023    3:07 PM 08/17/2023    1:25 PM  Last 3 Weights  Weight (lbs) 250 lb 7.1 oz 250 lb 9.6 oz 247 lb 6.4 oz  Weight (kg) 113.6 kg 113.671 kg 112.22 kg       Physical Exam   GEN: No acute distress.   Cardiac: TTP at lower incision. Clear drainage from site this AM. T Respiratory: Clear to auscultation bilaterally. Psych: agitated.  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  Device will need to be removed this week. Will need anesthesia support. Cont abx per primary team.  #ICM #HFrEF NYHA II this AM.  Cont metoprolol     [name], MD, [facility], [facility] Cardiac Electrophysiology

## 2023-09-05 ENCOUNTER — Encounter (HOSPITAL_COMMUNITY)
Admission: EM | Discharge status: Against Medical Advice | Payer: Self-pay | Source: Home / Self Care | Attending: Internal Medicine

## 2023-09-05 ENCOUNTER — Inpatient Hospital Stay (HOSPITAL_COMMUNITY): Payer: MEDICAID

## 2023-09-05 ENCOUNTER — Inpatient Hospital Stay (HOSPITAL_COMMUNITY): Payer: MEDICAID | Admitting: Certified Registered Nurse Anesthetist

## 2023-09-05 DIAGNOSIS — F17210 Nicotine dependence, cigarettes, uncomplicated: Secondary | ICD-10-CM

## 2023-09-05 DIAGNOSIS — F418 Other specified anxiety disorders: Secondary | ICD-10-CM

## 2023-09-05 DIAGNOSIS — I2510 Atherosclerotic heart disease of native coronary artery without angina pectoris: Secondary | ICD-10-CM | POA: Diagnosis not present

## 2023-09-05 DIAGNOSIS — Z4502 Encounter for adjustment and management of automatic implantable cardiac defibrillator: Secondary | ICD-10-CM

## 2023-09-05 DIAGNOSIS — A419 Sepsis, unspecified organism: Secondary | ICD-10-CM | POA: Diagnosis not present

## 2023-09-05 HISTORY — PX: SUBQ ICD REVISION: EP1224

## 2023-09-05 LAB — CBC WITH DIFFERENTIAL/PLATELET
Abs Immature Granulocytes: 0.03 10*3/uL (ref 0.00–0.07)
Basophils Absolute: 0.1 10*3/uL (ref 0.0–0.1)
Basophils Relative: 1 %
Eosinophils Absolute: 0.5 10*3/uL (ref 0.0–0.5)
Eosinophils Relative: 6 %
HCT: 42.2 % (ref 36.0–46.0)
Hemoglobin: 13.6 g/dL (ref 12.0–15.0)
Immature Granulocytes: 0 %
Lymphocytes Relative: 33 %
Lymphs Abs: 2.6 10*3/uL (ref 0.7–4.0)
MCH: 28.9 pg (ref 26.0–34.0)
MCHC: 32.2 g/dL (ref 30.0–36.0)
MCV: 89.8 fL (ref 80.0–100.0)
Monocytes Absolute: 0.7 10*3/uL (ref 0.1–1.0)
Monocytes Relative: 9 %
Neutro Abs: 4 10*3/uL (ref 1.7–7.7)
Neutrophils Relative %: 51 %
Platelets: 237 10*3/uL (ref 150–400)
RBC: 4.7 MIL/uL (ref 3.87–5.11)
RDW: 13.2 % (ref 11.5–15.5)
WBC: 7.9 10*3/uL (ref 4.0–10.5)
nRBC: 0 % (ref 0.0–0.2)

## 2023-09-05 LAB — COMPREHENSIVE METABOLIC PANEL WITH GFR
ALT: 13 U/L (ref 0–44)
AST: 11 U/L — ABNORMAL LOW (ref 15–41)
Albumin: 2.9 g/dL — ABNORMAL LOW (ref 3.5–5.0)
Alkaline Phosphatase: 62 U/L (ref 38–126)
Anion gap: 12 (ref 5–15)
BUN: 6 mg/dL (ref 6–20)
CO2: 18 mmol/L — ABNORMAL LOW (ref 22–32)
Calcium: 8.8 mg/dL — ABNORMAL LOW (ref 8.9–10.3)
Chloride: 108 mmol/L (ref 98–111)
Creatinine, Ser: 0.59 mg/dL (ref 0.44–1.00)
GFR, Estimated: >60 mL/min (ref >60)
Glucose, Bld: 94 mg/dL (ref 70–99)
Potassium: 3.5 mmol/L (ref 3.5–5.1)
Sodium: 138 mmol/L (ref 135–145)
Total Bilirubin: 0.7 mg/dL (ref 0.0–1.2)
Total Protein: 6.2 g/dL — ABNORMAL LOW (ref 6.5–8.1)

## 2023-09-05 LAB — MAGNESIUM: Magnesium: 1.8 mg/dL (ref 1.7–2.4)

## 2023-09-05 MED ORDER — CHLORHEXIDINE GLUCONATE 4 % EX SOLN
60.0000 mL | Freq: Once | CUTANEOUS | Status: DC
  Filled 2023-09-05: qty 60

## 2023-09-05 MED ORDER — FENTANYL CITRATE (PF) 100 MCG/2ML IJ SOLN
INTRAMUSCULAR | Status: AC
  Filled 2023-09-05: qty 2

## 2023-09-05 MED ORDER — SODIUM CHLORIDE 0.9 % IV SOLN: 250.0000 mL | INTRAVENOUS | Status: DC

## 2023-09-05 MED ORDER — SODIUM CHLORIDE 0.9 % IV SOLN
80.0000 mg | INTRAVENOUS | Status: DC
  Filled 2023-09-05: qty 2

## 2023-09-05 MED ORDER — GENTAMICIN SULFATE 40 MG/ML IJ SOLN
INTRAMUSCULAR | Status: DC | PRN
  Administered 2023-09-05 (×2): 80 mg

## 2023-09-05 MED ORDER — EPHEDRINE SULFATE-NACL 50-0.9 MG/10ML-% IV SOSY
PREFILLED_SYRINGE | INTRAVENOUS | Status: DC | PRN
  Administered 2023-09-05: 5 mg via INTRAVENOUS

## 2023-09-05 MED ORDER — PROPOFOL 10 MG/ML IV BOLUS
INTRAVENOUS | Status: DC | PRN
  Administered 2023-09-05: 50 ug/kg/min via INTRAVENOUS
  Administered 2023-09-05: 130 mg via INTRAVENOUS

## 2023-09-05 MED ORDER — BUPIVACAINE HCL (PF) 0.25 % IJ SOLN
INTRAMUSCULAR | Status: AC
Start: 2023-09-05 — End: ?
  Filled 2023-09-05: qty 30

## 2023-09-05 MED ORDER — GENTAMICIN SULFATE 40 MG/ML IJ SOLN
INTRAMUSCULAR | Status: AC
  Filled 2023-09-05: qty 2

## 2023-09-05 MED ORDER — OXYCODONE HCL 5 MG PO TABS: 5.0000 mg | ORAL_TABLET | Freq: Once | ORAL | Status: DC | PRN

## 2023-09-05 MED ORDER — SODIUM CHLORIDE 0.9 % IV SOLN
INTRAVENOUS | Status: AC
  Filled 2023-09-05: qty 2

## 2023-09-05 MED ORDER — OXYCODONE HCL 5 MG/5ML PO SOLN: 5.0000 mg | Freq: Once | ORAL | Status: DC | PRN

## 2023-09-05 MED ORDER — HYDROMORPHONE HCL 1 MG/ML IJ SOLN
INTRAMUSCULAR | Status: AC
  Filled 2023-09-05: qty 0.5

## 2023-09-05 MED ORDER — SODIUM CHLORIDE 0.9% FLUSH
3.0000 mL | Freq: Two times a day (BID) | INTRAVENOUS | Status: DC
  Administered 2023-09-05 – 2023-09-06 (×3): 3 mL via INTRAVENOUS

## 2023-09-05 MED ORDER — LIDOCAINE 2% (20 MG/ML) 5 ML SYRINGE
INTRAMUSCULAR | Status: DC | PRN
  Administered 2023-09-05: 100 mg via INTRAVENOUS

## 2023-09-05 MED ORDER — CHLORHEXIDINE GLUCONATE 4 % EX SOLN
60.0000 mL | Freq: Once | CUTANEOUS | Status: AC
  Filled 2023-09-05: qty 60

## 2023-09-05 MED ORDER — SODIUM CHLORIDE 0.9% FLUSH: 3.0000 mL | INTRAVENOUS | Status: DC | PRN

## 2023-09-05 MED ORDER — MIDAZOLAM HCL 2 MG/2ML IJ SOLN
INTRAMUSCULAR | Status: AC
  Filled 2023-09-05: qty 2

## 2023-09-05 MED ORDER — DEXMEDETOMIDINE HCL IN NACL 80 MCG/20ML IV SOLN
INTRAVENOUS | Status: AC
  Filled 2023-09-05: qty 20

## 2023-09-05 MED ORDER — FENTANYL CITRATE (PF) 100 MCG/2ML IJ SOLN
25.0000 ug | INTRAMUSCULAR | Status: DC | PRN
  Administered 2023-09-05: 25 ug via INTRAVENOUS

## 2023-09-05 MED ORDER — MIDAZOLAM HCL 2 MG/2ML IJ SOLN
INTRAMUSCULAR | Status: DC | PRN
  Administered 2023-09-05: 2 mg via INTRAVENOUS

## 2023-09-05 MED ORDER — VANCOMYCIN HCL 1500 MG/300ML IV SOLN: 1500.0000 mg | INTRAVENOUS | Status: DC

## 2023-09-05 MED ORDER — VANCOMYCIN HCL IN DEXTROSE 1-5 GM/200ML-% IV SOLN
INTRAVENOUS | Status: AC
  Filled 2023-09-05: qty 200

## 2023-09-05 MED ORDER — FENTANYL CITRATE (PF) 100 MCG/2ML IJ SOLN
INTRAMUSCULAR | Status: DC
Start: 2023-09-05 — End: 2023-09-05
  Filled 2023-09-05: qty 2

## 2023-09-05 MED ORDER — DEXMEDETOMIDINE HCL IN NACL 80 MCG/20ML IV SOLN
INTRAVENOUS | Status: DC | PRN
  Administered 2023-09-05: 8 ug via INTRAVENOUS
  Administered 2023-09-05 (×3): 4 ug via INTRAVENOUS

## 2023-09-05 MED ORDER — OXYCODONE HCL 5 MG PO TABS
ORAL_TABLET | ORAL | Status: AC
Start: 2023-09-05 — End: 2023-09-06
  Filled 2023-09-05: qty 1

## 2023-09-05 MED ORDER — BUPIVACAINE HCL (PF) 0.25 % IJ SOLN
INTRAMUSCULAR | Status: DC | PRN
  Administered 2023-09-05: 60 mL

## 2023-09-05 MED ORDER — HEPARIN (PORCINE) IN NACL 1000-0.9 UT/500ML-% IV SOLN
INTRAVENOUS | Status: DC | PRN
Start: 2023-09-05 — End: 2023-09-05
  Administered 2023-09-05: 500 mL

## 2023-09-05 MED ORDER — SUGAMMADEX SODIUM 200 MG/2ML IV SOLN
INTRAVENOUS | Status: DC | PRN
  Administered 2023-09-05 (×2): 100 mg via INTRAVENOUS

## 2023-09-05 MED ORDER — BUPIVACAINE HCL (PF) 0.25 % IJ SOLN
INTRAMUSCULAR | Status: AC
  Filled 2023-09-05: qty 30

## 2023-09-05 MED ORDER — ORAL CARE MOUTH RINSE: 15.0000 mL | OROMUCOSAL | Status: DC | PRN

## 2023-09-05 MED ORDER — PHENYLEPHRINE HCL-NACL 20-0.9 MG/250ML-% IV SOLN
INTRAVENOUS | Status: DC | PRN
  Administered 2023-09-05: 100 ug/min via INTRAVENOUS

## 2023-09-05 MED ORDER — ROCURONIUM BROMIDE 10 MG/ML (PF) SYRINGE
PREFILLED_SYRINGE | INTRAVENOUS | Status: DC | PRN
  Administered 2023-09-05: 20 mg via INTRAVENOUS
  Administered 2023-09-05: 50 mg via INTRAVENOUS

## 2023-09-05 NOTE — Transfer of Care (Signed)
 Immediate Anesthesia Transfer of Care Note  Patient: Elizabeth Spencer  Procedure(s) Performed: Leida Puna ICD REVISION  Patient Location: PACU  Anesthesia Type:General  Level of Consciousness: awake, alert , and oriented  Airway & Oxygen Therapy: Patient Spontanous Breathing and Patient connected to nasal cannula oxygen  Post-op Assessment: Report given to RN and Post -op Vital signs reviewed and stable  Post vital signs: Reviewed and stable  Last Vitals:  Vitals Value Taken Time  BP    Temp    Pulse    Resp    SpO2      Last Pain:  Vitals:   09/05/23 1344  TempSrc:   PainSc: 6          Complications: There were no known notable events for this encounter.

## 2023-09-05 NOTE — Progress Notes (Addendum)
 Rounding Note   Patient Name: Elizabeth Spencer Date of Encounter: 09/05/2023  [facility] Cardiologist: Sammy Crisp, MD   Subjective  Electrode site/wound is tender/sore, felt winded yesterday walking in the hall  Scheduled Meds:  aspirin   81 mg Oral Daily   atorvastatin   40 mg Oral Daily   clopidogrel   75 mg Oral Daily   enoxaparin  (LOVENOX ) injection  40 mg Subcutaneous Q24H   loratadine   10 mg Oral QHS   methimazole   15 mg Oral Daily   metoprolol  succinate  25 mg Oral Daily   montelukast   10 mg Oral Daily   nicotine   7 mg Transdermal Daily   pantoprazole   40 mg Oral QHS   Continuous Infusions:  ceFEPime  (MAXIPIME ) IV 2 g (09/05/23 0611)   vancomycin  1,000 mg (09/04/23 2332)   PRN Meds: acetaminophen  **OR** acetaminophen , ALPRAZolam , HYDROmorphone  (DILAUDID ) injection, ondansetron  **OR** ondansetron  (ZOFRAN ) IV, oxyCODONE , senna-docusate   Vital Signs  Vitals:   09/04/23 1641 09/04/23 2136 09/05/23 0611 09/05/23 0806  BP: 108/80 (!) 140/48 (!) 100/40 111/73  Pulse: 89 82 83 86  Resp: 19 18 18 19   Temp: 98.4 F (36.9 C) 98.5 F (36.9 C) 98.7 F (37.1 C) 98.6 F (37 C)  TempSrc:      SpO2: 98% 94% 96% 97%  Weight:      Height:        Intake/Output Summary (Last 24 hours) at 09/05/2023 0827 Last data filed at 09/04/2023 1755 Gross per 24 hour  Intake 600 ml  Output 0 ml  Net 600 ml      09/01/2023    7:39 PM 08/25/2023    3:07 PM 08/17/2023    1:25 PM  Last 3 Weights  Weight (lbs) 250 lb 7.1 oz 250 lb 9.6 oz 247 lb 6.4 oz  Weight (kg) 113.6 kg 113.671 kg 112.22 kg      Telemetry SR 80's-90's - Personally Reviewed  ECG  No new EKGs - Personally Reviewed  Physical Exam  GEN: No acute distress.   Neck: No JVD Cardiac: RRR, no murmurs, rubs, or gallops.  Respiratory: Clear to auscultation bilaterally. Inferior electrode incision site: is dressed, evidence of purulent drainage noted on dressing GI: Soft, nontender, non-distended  MS: No  edema; No deformity. Neuro:  Nonfocal  Psych: Normal affect   Labs High Sensitivity Troponin:   Recent Labs  Lab 09/01/23 2050 09/01/23 2245  TROPONINIHS 5 5     Chemistry Recent Labs  Lab 09/03/23 1453 09/04/23 0336 09/05/23 0448  NA 137 137 138  K 3.8 3.7 3.5  CL 109 110 108  CO2 20* 17* 18*  GLUCOSE 100* 89 94  BUN 7 6 6   CREATININE 0.71 0.60 0.59  CALCIUM  8.8* 8.6* 8.8*  MG 1.8 1.7 1.8  PROT 6.3* 5.8* 6.2*  ALBUMIN  2.9* 2.7* 2.9*  AST 15 12* 11*  ALT 18 15 13   ALKPHOS 69 59 62  BILITOT 0.5 0.9 0.7  GFRNONAA >60 >60 >60  ANIONGAP 8 10 12     Lipids No results for input(s): "CHOL", "TRIG", "HDL", "LABVLDL", "LDLCALC", "CHOLHDL" in the last 168 hours.  Hematology Recent Labs  Lab 09/03/23 1453 09/04/23 0336 09/05/23 0448  WBC 9.6 8.6 7.9  RBC 4.54 4.35 4.70  HGB 13.5 12.9 13.6  HCT 40.8 39.0 42.2  MCV 89.9 89.7 89.8  MCH 29.7 29.7 28.9  MCHC 33.1 33.1 32.2  RDW 13.4 13.2 13.2  PLT 185 181 237   Thyroid   Recent Labs  Lab 09/02/23 1037  TSH 0.022*  FREET4 1.12    BNPNo results for input(s): "BNP", "PROBNP" in the last 168 hours.  DDimer No results for input(s): "DDIMER" in the last 168 hours.   Radiology  No results found.  Cardiac Studies  02/24/23: TTE 1. Left ventricular ejection fraction, by estimation, is 30 to 35%. The  left ventricle has moderately decreased function. The left ventricle  demonstrates global hypokinesis. Left ventricular diastolic parameters are  consistent with Grade I diastolic  dysfunction (impaired relaxation). The average left ventricular global  longitudinal strain is -9.4 %.   2. Right ventricular systolic function is normal. The right ventricular  size is normal. Tricuspid regurgitation signal is inadequate for assessing  PA pressure.   3. The mitral valve is normal in structure. Moderate mitral valve  regurgitation. No evidence of mitral stenosis.   4. The aortic valve is normal in structure. Aortic valve  regurgitation is  not visualized. No aortic stenosis is present.   5. The inferior vena cava is normal in size with greater than 50%  respiratory variability, suggesting right atrial pressure of 3 mmHg.   Patient Profile   38 y.o. female w/PMHx of  CAD (CABG may 2025), HTN, HLD, DM II,  Asthma, migraine HAs, hypothyroidism, PTSD/anxiety ICM, chronic CHF (systolic), SICD  Admitted 2/2 ICD electrode incision infection/pain/drainage  Assessment & Plan   S-ICD electrode incision site infection/drainage System will need to be removed  BC (x2) neg to date (3 days) Afebrile Leukocytosis > resolved On cefepime  and vanc Plan for device system removal today Patient is aware and agreeable  2. CAD No symptoms/CP  3. ICM Some DOE yesterday in the hall Lungs are clear, not edematous No obvious volume OL  Infection/antibiotics, pain management and her other chronic medical conditions management w/IM team   Signed, [name], PA-C  09/05/2023, 8:27 AM    I have seen, examined the patient, and reviewed the above assessment and plan.    Interval:  No acute overnight events. Patient hoping to go home soon.   General: Well developed, in no acute distress.  Neck: No JVD.  Cardiac: Normal rate, regular rhythm. Subxiphoid incision dressed with evidence of yellow drainage. Resp: Normal work of breathing.  Ext: No edema.  Neuro: No gross focal deficits.   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  - Remove device today under general anesthesia. Risks and benefits of S-ICD explant have been discussed with patient. Patient verbalized understanding and wants to proceed.  - Appreciate IM/ID assistance with antibiotic management and pain control. Will ask ID to assist with antibiotic regimen on discharge and duration.   #Chronic systolic heart failure: Well compensated.  #Ishemic cardiomyopathy: - Continue metoprolol , additional  GDMT has been limited by baseline low blood pressure.   [name], MD 09/05/2023 12:42 PM

## 2023-09-05 NOTE — Anesthesia Procedure Notes (Signed)
 Procedure Name: Intubation Date/Time: 09/05/2023 3:42 PM  Performed by: Alvan Culpepper A, CRNAPre-anesthesia Checklist: Patient identified, Emergency Drugs available, Suction available and Patient being monitored Patient Re-evaluated:Patient Re-evaluated prior to induction Oxygen Delivery Method: Circle System Utilized Preoxygenation: Pre-oxygenation with 100% oxygen Induction Type: IV induction Ventilation: Mask ventilation without difficulty Laryngoscope Size: Mac and 3 Grade View: Grade I Tube type: Oral Tube size: 7.0 mm Number of attempts: 1 Airway Equipment and Method: Stylet and Oral airway Placement Confirmation: ETT inserted through vocal cords under direct vision, positive ETCO2 and breath sounds checked- equal and bilateral Secured at: 22 cm Tube secured with: Tape Dental Injury: Teeth and Oropharynx as per pre-operative assessment

## 2023-09-05 NOTE — Progress Notes (Signed)
 [facility] Progress Note Patient: Elizabeth Spencer ZOX:[id number] DOB: 1986-01-21 DOA: 09/01/2023  DOS: the patient was seen and examined on 09/05/2023  Brief Hospital Course: PMH of CAD SP CABG, type II DM, HLD, HTN, HFrEF NICM SP ICD implant, PTSD, hyperthyroidism on methimazole  present to the hospital with nonhealing wound after ICD lead revision with nausea vomiting and fever. Currently being admitted for sepsis secondary to wound infection. EP following. Currently recommending conservative measures with IV antibiotics. Assessment and Plan: Sepsis secondary to surgical site infection. Recent ICD placement. Presented with fever nausea vomiting.  Had leukocytosis.  Met SIRS criteria. Receiving IV antibiotics. Blood cultures so far no growth. MRSA PCR negative which is reassuring. Given surgical intervention I would still continue IV vancomycin  and IV cefepime . CT chest negative for any drainable abscess but likely has skin seroma. EP recommending removal of the pacemaker. Follow-up on procedure report and recommendation cardiology with regards to antibiotic duration.  Ischemic cardiomyopathy SP ICD implant HFrEF CAD CABG Management per cardiology.  Currently no evidence of active angina. Volume status adequate as well. Recent EF 25 to 30%. Continue Toprol -XL.  Hyperthyroidism. On methimazole  daily. For now continue.  Type 2 diabetes mellitus, well-controlled without long-term insulin  use. On Farxiga  currently on hold.  Anxiety. On scheduled long-acting Xanax  at home. Currently on Xanax  as needed.  Pain control. Patient has concerns with regards to use of oxycodone . Educated on drug interactions. Systolic blood pressure of 90 and heart rate of 50 would be adequate to utilize oral pain medication that is ordered.  Obesity class I. Body mass index is 39.22 kg/m.  Placing the patient at high risk for poor outcome.  HLD. Continuing statin.  Hypokalemia. Replaced  orally.   Chronic nausea. Requesting nausea medication on discharge.   Subjective: Denies any nausea or vomiting right now.  Pain well-controlled.  Physical Exam: Alert awake and oriented x 3. No respiratory distress. No focal deficit.  Data Reviewed: I have Reviewed nursing notes, Vitals, and Lab results. Since last encounter, pertinent lab results CBC and BMP   . I have ordered test including CBC and BMP  .   Disposition: Status is: Inpatient Remains inpatient appropriate because: Getting IV antibiotics Place and maintain sequential compression device Start: 09/05/23 0905   Family Communication: No one at bedside Level of care: Telemetry Medical   Vitals:   09/04/23 1641 09/04/23 2136 09/05/23 0611 09/05/23 0806  BP: 108/80 (!) 140/48 (!) 100/40 111/73  Pulse: 89 82 83 86  Resp: 19 18 18 19   Temp: 98.4 F (36.9 C) 98.5 F (36.9 C) 98.7 F (37.1 C) 98.6 F (37 C)  TempSrc:      SpO2: 98% 94% 96% 97%  Weight:      Height:         Author: Charlean Congress, MD 09/05/2023 6:03 PM  Please look on [email or web address] to find out who is on call.

## 2023-09-05 NOTE — Interval H&P Note (Signed)
 History and Physical Interval Note:  09/05/2023 12:49 PM  Elizabeth Spencer  has presented today for surgery, with the diagnosis of ICD/surgical site infection.  The various methods of treatment have been discussed with the patient and family. After consideration of risks, benefits and other options for treatment, the patient has consented to  Procedure(s): SUBQ ICD REVISION (N/A) / explantation as a surgical intervention.  The patient's history has been reviewed, patient examined, no change in status, stable for surgery.  I have reviewed the patient's chart and labs.  Questions were answered to the patient's satisfaction.     [name]

## 2023-09-05 NOTE — TOC Progression Note (Signed)
 Transition of Care [facility]) - Progression Note    Patient Details  Name: Elizabeth Spencer MRN: [id number] Date of Birth: 1985-09-28  Transition of Care [facility]) CM/SW Contact  Tom-Johnson, Tehila Sokolow Daphne, RN Phone Number: 09/05/2023, 3:34 PM  Clinical Narrative:     Patient scheduled for subcutaneous ICD Revision/Explantation today 09/05/23 by Cardiology.    Patient not Medically ready for discharge.  CM will continue to follow as patient progresses with care towards discharge.        Expected Discharge Plan and Services                                               Social Determinants of Health (SDOH) Interventions SDOH Screenings   Food Insecurity: No Food Insecurity (09/02/2023)  Housing: Low Risk  (09/02/2023)  Transportation Needs: No Transportation Needs (09/02/2023)  Utilities: Not At Risk (09/02/2023)  Social Connections: Unknown (08/08/2023)  Tobacco Use: High Risk (09/01/2023)    Readmission Risk Interventions    09/02/2023    2:41 PM  Readmission Risk Prevention Plan  Transportation Screening Complete  PCP or Specialist Appt within 5-7 Days Complete  Home Care Screening Complete  Medication Review (RN CM) Referral to Pharmacy

## 2023-09-05 NOTE — Anesthesia Postprocedure Evaluation (Signed)
 Anesthesia Post Note  Patient: Elizabeth Spencer  Procedure(s) Performed: SUBQ ICD REVISION     Patient location during evaluation: PACU Anesthesia Type: General Level of consciousness: awake Pain management: pain level controlled Vital Signs Assessment: post-procedure vital signs reviewed and stable Respiratory status: spontaneous breathing, nonlabored ventilation and respiratory function stable Cardiovascular status: blood pressure returned to baseline and stable Postop Assessment: no apparent nausea or vomiting Anesthetic complications: no   There were no known notable events for this encounter.  Last Vitals:  Vitals:   09/05/23 1845 09/05/23 2032  BP:  109/69  Pulse:  90  Resp:  18  Temp: 36.9 C 36.9 C  SpO2:  98%    Last Pain:  Vitals:   09/05/23 2032  TempSrc: Oral  PainSc:                  Conard Decent

## 2023-09-05 NOTE — Progress Notes (Signed)
 Pt with many complaints, pain noted to left, lateral, and mid chest areas, described as burning, see MAR, pt stated she wanted dilaudid , not fentanyl , PO oxycodone  given r/t post anesthesia and BP slightly soft, c/o tap water tasting bad and why did this unit not have bottled water, also c/o of hearing someone laugh and thinking it was regarding herself, the pt, pt given support, also [facility]M called and asked to retrieve pt belongings, including phone charger from bed and send to [facility]

## 2023-09-05 NOTE — H&P (View-Only) (Signed)
 Rounding Note   Patient Name: Elizabeth Spencer Date of Encounter: 09/05/2023  [facility] Cardiologist: Sammy Crisp, MD   Subjective  Electrode site/wound is tender/sore, felt winded yesterday walking in the hall  Scheduled Meds:  aspirin   81 mg Oral Daily   atorvastatin   40 mg Oral Daily   clopidogrel   75 mg Oral Daily   enoxaparin  (LOVENOX ) injection  40 mg Subcutaneous Q24H   loratadine   10 mg Oral QHS   methimazole   15 mg Oral Daily   metoprolol  succinate  25 mg Oral Daily   montelukast   10 mg Oral Daily   nicotine   7 mg Transdermal Daily   pantoprazole   40 mg Oral QHS   Continuous Infusions:  ceFEPime  (MAXIPIME ) IV 2 g (09/05/23 0611)   vancomycin  1,000 mg (09/04/23 2332)   PRN Meds: acetaminophen  **OR** acetaminophen , ALPRAZolam , HYDROmorphone  (DILAUDID ) injection, ondansetron  **OR** ondansetron  (ZOFRAN ) IV, oxyCODONE , senna-docusate   Vital Signs  Vitals:   09/04/23 1641 09/04/23 2136 09/05/23 0611 09/05/23 0806  BP: 108/80 (!) 140/48 (!) 100/40 111/73  Pulse: 89 82 83 86  Resp: 19 18 18 19   Temp: 98.4 F (36.9 C) 98.5 F (36.9 C) 98.7 F (37.1 C) 98.6 F (37 C)  TempSrc:      SpO2: 98% 94% 96% 97%  Weight:      Height:        Intake/Output Summary (Last 24 hours) at 09/05/2023 0827 Last data filed at 09/04/2023 1755 Gross per 24 hour  Intake 600 ml  Output 0 ml  Net 600 ml      09/01/2023    7:39 PM 08/25/2023    3:07 PM 08/17/2023    1:25 PM  Last 3 Weights  Weight (lbs) 250 lb 7.1 oz 250 lb 9.6 oz 247 lb 6.4 oz  Weight (kg) 113.6 kg 113.671 kg 112.22 kg      Telemetry SR 80's-90's - Personally Reviewed  ECG  No new EKGs - Personally Reviewed  Physical Exam  GEN: No acute distress.   Neck: No JVD Cardiac: RRR, no murmurs, rubs, or gallops.  Respiratory: Clear to auscultation bilaterally. Inferior electrode incision site: is dressed, evidence of purulent drainage noted on dressing GI: Soft, nontender, non-distended  MS: No  edema; No deformity. Neuro:  Nonfocal  Psych: Normal affect   Labs High Sensitivity Troponin:   Recent Labs  Lab 09/01/23 2050 09/01/23 2245  TROPONINIHS 5 5     Chemistry Recent Labs  Lab 09/03/23 1453 09/04/23 0336 09/05/23 0448  NA 137 137 138  K 3.8 3.7 3.5  CL 109 110 108  CO2 20* 17* 18*  GLUCOSE 100* 89 94  BUN 7 6 6   CREATININE 0.71 0.60 0.59  CALCIUM  8.8* 8.6* 8.8*  MG 1.8 1.7 1.8  PROT 6.3* 5.8* 6.2*  ALBUMIN  2.9* 2.7* 2.9*  AST 15 12* 11*  ALT 18 15 13   ALKPHOS 69 59 62  BILITOT 0.5 0.9 0.7  GFRNONAA >60 >60 >60  ANIONGAP 8 10 12     Lipids No results for input(s): "CHOL", "TRIG", "HDL", "LABVLDL", "LDLCALC", "CHOLHDL" in the last 168 hours.  Hematology Recent Labs  Lab 09/03/23 1453 09/04/23 0336 09/05/23 0448  WBC 9.6 8.6 7.9  RBC 4.54 4.35 4.70  HGB 13.5 12.9 13.6  HCT 40.8 39.0 42.2  MCV 89.9 89.7 89.8  MCH 29.7 29.7 28.9  MCHC 33.1 33.1 32.2  RDW 13.4 13.2 13.2  PLT 185 181 237   Thyroid   Recent Labs  Lab 09/02/23 1037  TSH 0.022*  FREET4 1.12    BNPNo results for input(s): "BNP", "PROBNP" in the last 168 hours.  DDimer No results for input(s): "DDIMER" in the last 168 hours.   Radiology  No results found.  Cardiac Studies  02/24/23: TTE 1. Left ventricular ejection fraction, by estimation, is 30 to 35%. The  left ventricle has moderately decreased function. The left ventricle  demonstrates global hypokinesis. Left ventricular diastolic parameters are  consistent with Grade I diastolic  dysfunction (impaired relaxation). The average left ventricular global  longitudinal strain is -9.4 %.   2. Right ventricular systolic function is normal. The right ventricular  size is normal. Tricuspid regurgitation signal is inadequate for assessing  PA pressure.   3. The mitral valve is normal in structure. Moderate mitral valve  regurgitation. No evidence of mitral stenosis.   4. The aortic valve is normal in structure. Aortic valve  regurgitation is  not visualized. No aortic stenosis is present.   5. The inferior vena cava is normal in size with greater than 50%  respiratory variability, suggesting right atrial pressure of 3 mmHg.   Patient Profile   38 y.o. female w/PMHx of  CAD (CABG may 2025), HTN, HLD, DM II,  Asthma, migraine HAs, hypothyroidism, PTSD/anxiety ICM, chronic CHF (systolic), SICD  Admitted 2/2 ICD electrode incision infection/pain/drainage  Assessment & Plan   S-ICD electrode incision site infection/drainage System will need to be removed  BC (x2) neg to date (3 days) Afebrile Leukocytosis > resolved On cefepime  and vanc Plan for device system removal today Patient is aware and agreeable  2. CAD No symptoms/CP  3. ICM Some DOE yesterday in the hall Lungs are clear, not edematous No obvious volume OL  Infection/antibiotics, pain management and her other chronic medical conditions management w/IM team   Signed, [name], PA-C  09/05/2023, 8:27 AM    I have seen, examined the patient, and reviewed the above assessment and plan.    Interval:  No acute overnight events. Patient hoping to go home soon.   General: Well developed, in no acute distress.  Neck: No JVD.  Cardiac: Normal rate, regular rhythm. Subxiphoid incision dressed with evidence of yellow drainage. Resp: Normal work of breathing.  Ext: No edema.  Neuro: No gross focal deficits.   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  - Remove device today under general anesthesia. Risks and benefits of S-ICD explant have been discussed with patient. Patient verbalized understanding and wants to proceed.  - Appreciate IM/ID assistance with antibiotic management and pain control. Will ask ID to assist with antibiotic regimen on discharge and duration.   #Chronic systolic heart failure: Well compensated.  #Ishemic cardiomyopathy: - Continue metoprolol , additional  GDMT has been limited by baseline low blood pressure.   [name], MD 09/05/2023 12:42 PM

## 2023-09-05 NOTE — Anesthesia Preprocedure Evaluation (Addendum)
 Anesthesia Evaluation  Patient identified by MRN, date of birth, ID band Patient awake    Reviewed: Allergy & Precautions, NPO status , Patient's Chart, lab work & pertinent test results  History of Anesthesia Complications (+) Family history of anesthesia reaction and history of anesthetic complications (red face with c-section; brother, niece, and nephew had hives and red face)  Airway Mallampati: II  TM Distance: >3 FB Neck ROM: Full   Comment: Previous grade I view with MAC 4, easy mask Dental  (+) Dental Advisory Given, Poor Dentition, Chipped, Missing   Pulmonary asthma (no recent flares) , sleep apnea (does not use CPAP) , Current Smoker and Patient abstained from smoking.   Pulmonary exam normal breath sounds clear to auscultation       Cardiovascular (-) angina + CAD, + Past MI, + CABG (09/02/2022) and +CHF (EF 30-35%)  + Cardiac Defibrillator + Valvular Problems/Murmurs (moderate) MR  Rhythm:Regular Rate:Normal  HLD  TTE 02/24/2023: IMPRESSIONS    1. Left ventricular ejection fraction, by estimation, is 30 to 35%. The  left ventricle has moderately decreased function. The left ventricle  demonstrates global hypokinesis. Left ventricular diastolic parameters are  consistent with Grade I diastolic  dysfunction (impaired relaxation). The average left ventricular global  longitudinal strain is -9.4 %.   2. Right ventricular systolic function is normal. The right ventricular  size is normal. Tricuspid regurgitation signal is inadequate for assessing  PA pressure.   3. The mitral valve is normal in structure. Moderate mitral valve  regurgitation. No evidence of mitral stenosis.   4. The aortic valve is normal in structure. Aortic valve regurgitation is  not visualized. No aortic stenosis is present.   5. The inferior vena cava is normal in size with greater than 50%  respiratory variability, suggesting right atrial  pressure of 3 mmHg.     Neuro/Psych  Headaches, neg Seizures PSYCHIATRIC DISORDERS (PTSD) Anxiety Depression Bipolar Disorder   Brain aneurysm    GI/Hepatic ,GERD  ,,(+)     substance abuse  cocaine use  Endo/Other  neg diabetes Hyperthyroidism (on methimazole )   Renal/GU negative Renal ROS     Musculoskeletal  (+) Arthritis ,    Abdominal  (+) + obese  Peds  Hematology negative hematology ROS (+) Lab Results      Component                Value               Date                      WBC                      7.9                 09/05/2023                HGB                      13.6                09/05/2023                HCT                      42.2                09/05/2023  MCV                      89.8                09/05/2023                PLT                      237                 09/05/2023              Anesthesia Other Findings On Plavix   Reproductive/Obstetrics                              Anesthesia Physical Anesthesia Plan  ASA: 4  Anesthesia Plan: General   Post-op Pain Management: Tylenol  PO (pre-op)*   Induction: Intravenous  PONV Risk Score and Plan: 2 and Ondansetron , Dexamethasone  and Treatment may vary due to age or medical condition  Airway Management Planned: Oral ETT  Additional Equipment:   Intra-op Plan:   Post-operative Plan: Extubation in OR  Informed Consent: I have reviewed the patients History and Physical, chart, labs and discussed the procedure including the risks, benefits and alternatives for the proposed anesthesia with the patient or authorized representative who has indicated his/her understanding and acceptance.     Dental advisory given  Plan Discussed with: CRNA and Anesthesiologist  Anesthesia Plan Comments: (Risks of general anesthesia discussed including, but not limited to, sore throat, hoarse voice, chipped/damaged teeth, injury to vocal cords, nausea and vomiting,  allergic reactions, lung infection, heart attack, stroke, and death. All questions answered. )         Anesthesia Quick Evaluation

## 2023-09-06 ENCOUNTER — Ambulatory Visit: Payer: MEDICAID | Admitting: Cardiology

## 2023-09-06 ENCOUNTER — Other Ambulatory Visit (HOSPITAL_COMMUNITY): Payer: Self-pay

## 2023-09-06 DIAGNOSIS — T827XXA Infection and inflammatory reaction due to other cardiac and vascular devices, implants and grafts, initial encounter: Secondary | ICD-10-CM

## 2023-09-06 DIAGNOSIS — A419 Sepsis, unspecified organism: Secondary | ICD-10-CM | POA: Diagnosis not present

## 2023-09-06 LAB — CULTURE, BLOOD (ROUTINE X 2)
Culture: NO GROWTH
Culture: NO GROWTH
Special Requests: ADEQUATE
Special Requests: ADEQUATE

## 2023-09-06 MED ORDER — LINEZOLID 600 MG PO TABS
600.0000 mg | ORAL_TABLET | Freq: Two times a day (BID) | ORAL | 0 refills | Status: DC
Start: 2023-09-06 — End: 2023-09-22
  Filled 2023-09-06: qty 28, 14d supply, fill #0

## 2023-09-06 NOTE — Consult Note (Signed)
 [facility]  Total days of antibiotics 6         Reason for Consult: cutaneous infection of ICD sternal wire/CIED   Referring Physician: mealor  Principal Problem:   Sepsis (HCC) Active Problems:   Postoperative infection   Surgical site infection   Tachycardia    HPI: Elizabeth Spencer is a 38 y.o. female  with history of chronic systolic CHF s/p S-ICD placement on 5/5 with lead revision on 5/6. She was seen in the cards clinic on 5/29 where there was a serosangions drainage from subxyphoid incision where she started to have subjective chills, and nausea and vomiting. Thus she was admitted on 5/29 for evaluation. Her WBC 25K, she was afebrile, HR in 130s. On exam, had tendernesion anteiror chest and left lateral chest incision and possible purulent drainage from subxiphoid incision. Blood cx were taken which was NGTD, she underwent chest CT that showed some stranding to inferior sterna icd lead incision. She had device extraction on 09/05/23. In the meantime, her WBC trended back to WNL.  Micro shows device cultures is NGTD x 24hr, and the sternal incision GPC in prs, and subxiphoid is growing, but too early to identify. She is afebrile throught the hospitalizatoin Patient would like to take oral abtx and be home Past Medical History:  Diagnosis Date   Aneurysm (HCC) 01/04/2014   Arthritis    Asthma    WELL CONTROLLED   Brain aneurysm 2007   NEUROLOGY NOTE DOES NOT MENTION ANEURYSM BUT PT STATES SHE DID NOT HAVE TO HAVE SURGERY   Complication of anesthesia    FOR 1 C-SECTION PT WAS ITCHING AND VERY RED ON HER FACE   Cough, persistent 01/27/2016   Dermatitis due to sunburn 11/08/2013   Family history of adverse reaction to anesthesia    brother, neice and nephew got red in face with hives   Gallstones    GERD (gastroesophageal reflux disease)    Gonorrhea 06/20/2020   Headache    CHRONIC HEADACHES   History of chronic cough    DRY   Loss of memory  05/30/2017   Perforation of left tympanic membrane 05/17/2013   Pneumonia    PTSD (post-traumatic stress disorder)    PTSD (post-traumatic stress disorder)    Thyroid  condition    PT WAS JUST TOLD ON 08-25-17 THAT SHE HAS A THYROID  PROBLEM AND IS GOING TO F/U WITH ENDOCRINOLOGIST IN 2 WEEKS   Trichomonas vaginalis (TV) infection 06/20/2020    Allergies:  Allergies  Allergen Reactions   Azithromycin Swelling and Other (See Comments)   Clindamycin  Swelling   Codeine Hives and Other (See Comments)    Nausea/dizzy    Meloxicam  Other (See Comments)    Burns up from the inside    Neurontin [Gabapentin] Other (See Comments)    Makes her pass out and not remember what happened prior to taking med.   Penicillins Anaphylaxis    Patient allergic to all "cillins" Has patient had a PCN reaction causing immediate rash, facial/tongue/throat swelling, SOB or lightheadedness with hypotension: Yes Has patient had a PCN reaction causing severe rash involving mucus membranes or skin necrosis: Yes Has patient had a PCN reaction that required hospitalization: No Has patient had a PCN reaction occurring within the last 10 years: Yes If all of the above answers are "NO", then may proceed with Cephalosporin use.    Clindamycin /Lincomycin Swelling   Other Hives and Other (See Comments)    Berry flavored  food and drinks   Diphenhydramine  Hcl Rash and Other (See Comments)    Uses dye-free; allergic to dye in regular benadryl    Doxycycline Rash   Egg-Derived Products Rash    MEDICATIONS:  aspirin   81 mg Oral Daily   atorvastatin   40 mg Oral Daily   loratadine   10 mg Oral QHS   methimazole   15 mg Oral Daily   metoprolol  succinate  25 mg Oral Daily   montelukast   10 mg Oral Daily   nicotine   7 mg Transdermal Daily   pantoprazole   40 mg Oral QHS   sodium chloride  flush  3 mL Intravenous Q12H    Social History   Tobacco Use   Smoking status: Every Day    Current packs/day: 0.50    Average  packs/day: 0.5 packs/day for 18.0 years (9.0 ttl pk-yrs)    Types: Cigarettes    Passive exposure: Past   Smokeless tobacco: Never  Vaping Use   Vaping status: Never Used  Substance Use Topics   Alcohol use: Yes    Alcohol/week: 0.0 standard drinks of alcohol    Comment: occasionally   Drug use: Not Currently    Types: Marijuana, Cocaine    Family History  Problem Relation Age of Onset   Hypertension Mother    Alcohol abuse Mother    Deep vein thrombosis Mother    Alcohol abuse Father    COPD Father    Emphysema Father    Heart disease Father     Review of Systems -  Mild chest wall discomfort from device extraction.  OBJECTIVE: Temp:  [97.8 F (36.6 C)-98.6 F (37 C)] 97.8 F (36.6 C) (06/03 1100) Pulse Rate:  [90-114] 97 (06/03 0933) Resp:  [5-30] 16 (06/03 0717) BP: (95-130)/(60-106) 130/106 (06/03 0933) SpO2:  [90 %-98 %] 95 % (06/03 0717) Weight:  [161 kg] 115 kg (06/03 0527) Physical Exam  Constitutional:  oriented to person, place, and time. appears well-developed and well-nourished. No distress.  HENT: [place]/AT, PERRLA, no scleral icterus Mouth/Throat: Oropharynx is clear and moist. No oropharyngeal exudate.  Cardiovascular: Normal rate, regular rhythm and normal heart sounds. Exam reveals no gallop and no friction rub.  No murmur heard.  Chest wall = incision covered. Pulmonary/Chest: Effort normal and breath sounds normal. No respiratory distress.  has no wheezes.  Neck = supple, no nuchal rigidity Abdominal: Soft. Bowel sounds are normal.  exhibits no distension. There is no tenderness.  Lymphadenopathy: no cervical adenopathy. No axillary adenopathy Neurological: alert and oriented to person, place, and time.  Skin: Skin is warm and dry. No rash noted. No erythema.  Psychiatric: a normal mood and affect.  behavior is normal.   LABS: Results for orders placed or performed during the hospital encounter of 09/01/23 (from the past 48 hours)  CBC with  Differential/Platelet     Status: None   Collection Time: 09/05/23  4:48 AM  Result Value Ref Range   WBC 7.9 4.0 - 10.5 K/uL   RBC 4.70 3.87 - 5.11 MIL/uL   Hemoglobin 13.6 12.0 - 15.0 g/dL   HCT 09.6 04.5 - 40.9 %   MCV 89.8 80.0 - 100.0 fL   MCH 28.9 26.0 - 34.0 pg   MCHC 32.2 30.0 - 36.0 g/dL   RDW 81.1 91.4 - 78.2 %   Platelets 237 150 - 400 K/uL   nRBC 0.0 0.0 - 0.2 %   Neutrophils Relative % 51 %   Neutro Abs 4.0 1.7 - 7.7 K/uL  Lymphocytes Relative 33 %   Lymphs Abs 2.6 0.7 - 4.0 K/uL   Monocytes Relative 9 %   Monocytes Absolute 0.7 0.1 - 1.0 K/uL   Eosinophils Relative 6 %   Eosinophils Absolute 0.5 0.0 - 0.5 K/uL   Basophils Relative 1 %   Basophils Absolute 0.1 0.0 - 0.1 K/uL   Immature Granulocytes 0 %   Abs Immature Granulocytes 0.03 0.00 - 0.07 K/uL    Comment: Performed at [facility], [address], [place], [place] [address]  Comprehensive metabolic panel with GFR     Status: Abnormal   Collection Time: 09/05/23  4:48 AM  Result Value Ref Range   Sodium 138 135 - 145 mmol/L   Potassium 3.5 3.5 - 5.1 mmol/L   Chloride 108 98 - 111 mmol/L   CO2 18 (L) 22 - 32 mmol/L   Glucose, Bld 94 70 - 99 mg/dL    Comment: Glucose reference range applies only to samples taken after fasting for at least 8 hours.   BUN 6 6 - 20 mg/dL   Creatinine, Ser 6.57 0.44 - 1.00 mg/dL   Calcium  8.8 (L) 8.9 - 10.3 mg/dL   Total Protein 6.2 (L) 6.5 - 8.1 g/dL   Albumin  2.9 (L) 3.5 - 5.0 g/dL   AST 11 (L) 15 - 41 U/L   ALT 13 0 - 44 U/L   Alkaline Phosphatase 62 38 - 126 U/L   Total Bilirubin 0.7 0.0 - 1.2 mg/dL   GFR, Estimated >84 >69 mL/min    Comment: (NOTE) Calculated using the CKD-EPI Creatinine Equation (2021)    Anion gap 12 5 - 15    Comment: Performed at [facility], [address] [place], [place] [address]  Magnesium      Status: None   Collection Time: 09/05/23  4:48 AM  Result Value Ref Range   Magnesium  1.8 1.7 - 2.4 mg/dL    Comment: Performed  at [facility], [address] [place], [place] [address]  Aerobic/Anaerobic Culture w Gram Stain (surgical/deep wound)     Status: None (Preliminary result)   Collection Time: 09/05/23  4:17 PM   Specimen: Wound  Result Value Ref Range   Specimen Description WOUND    Special Requests SUBSTERNAL    Gram Stain      FEW WBC PRESENT, PREDOMINANTLY PMN RARE GRAM POSITIVE COCCI IN PAIRS CRITICAL RESULT CALLED TO, READ BACK BY AND VERIFIED WITH: DR. Daneil Dunker [id number] @ 1807 FH Performed at [facility], [address], [place], [place] [address]    Culture PENDING    Report Status PENDING   Aerobic Culture w Gram Stain (superficial specimen)     Status: None (Preliminary result)   Collection Time: 09/05/23  4:36 PM   Specimen: Wound  Result Value Ref Range   Specimen Description WOUND    Special Requests SUBXIPHOID    Gram Stain      RARE WBC PRESENT, PREDOMINANTLY PMN NO ORGANISMS SEEN    Culture      CULTURE REINCUBATED FOR BETTER GROWTH Performed at [facility], [address], [place], [place] [address]    Report Status PENDING   Aerobic/Anaerobic Culture w Gram Stain (surgical/deep wound)     Status: None (Preliminary result)   Collection Time: 09/05/23  4:56 PM   Specimen: Wound  Result Value Ref Range   Specimen Description WOUND    Special Requests DEVICE    Gram Stain  NO WBC SEEN NO ORGANISMS SEEN     Culture      NO GROWTH < 24 HOURS Performed at [facility], [address] [place], [place] [address]    Report Status PENDING     MICRO: 6/2 gpc in pairs  MRSA not detectable  IMAGING: DG Chest Port 1 View Result Date: 09/05/2023 CLINICAL DATA:  Cardiac device in situ EXAM: PORTABLE CHEST 1 VIEW COMPARISON:  09/01/2023 FINDINGS: Interval removal of the patient's prior subcutaneous ICD. Mild left lower lobe opacity, likely atelectasis. Right lung is clear No pleural effusion or pneumothorax. Heart is normal in size. Median sternotomy.  IMPRESSION: Interval removal of the patient's prior subcutaneous ICD. Mild left lower lobe opacity, likely atelectasis. Electronically Signed   By: Zadie Herter M.D.   On: 09/05/2023 21:22  CLINICAL DATA:  Chest wall abscess, tachycardia, fever. Drainage from chest wall at defibrillator incision site.   EXAM: CT ANGIOGRAPHY CHEST WITH CONTRAST   TECHNIQUE: Multidetector CT imaging of the chest was performed using the standard protocol during bolus administration of intravenous contrast. Multiplanar CT image reconstructions and MIPs were obtained to evaluate the vascular anatomy.   RADIATION DOSE REDUCTION: This exam was performed according to the departmental dose-optimization program which includes automated exposure control, adjustment of the mA and/or kV according to patient size and/or use of iterative reconstruction technique.   CONTRAST:  75mL OMNIPAQUE  IOHEXOL  350 MG/ML SOLN   COMPARISON:  CTA chest 08/31/2022 and radiographs 09/01/2023   FINDINGS: Cardiovascular: Left posterior chest wall defibrillator. Streak artifact limits assessment. No definite fluid collection at the defibrillator. There is mild stranding along the course of the defibrillator lead. Thickening and vague edema within the medial left pectoralis major muscle at the tip of the defibrillator lead (circa series 7/image 103). Adjacent subcutaneous fat stranding and free fluid in the subcutaneous fat of the anterior chest wall suggestive of surgical site infection. No drainable fluid collection or soft tissue gas.   Normal heart size. No pericardial effusion. Sternotomy and CABG. Normal caliber thoracic aorta.   Mediastinum/Nodes: Multiple thyroid  nodules. The largest measures 2.2 cm in the right thyroid  lobe. Trachea and esophagus are unremarkable. No evidence of mediastinal fluid or abscess.   Lungs/Pleura: Bibasilar atelectasis/scarring. No focal consolidation, pleural effusion, or  pneumothorax.   Upper Abdomen: No acute abnormality.   Musculoskeletal: No acute fracture   Review of the MIP images confirms the above findings.   IMPRESSION: 1. Findings compatible with surgical site infection in the anterior chest wall. Vague edema and thickening of the medial left pectoralis major muscle suggest myositis. No drainable fluid collection.  Lab Results  Component Value Date   ESRSEDRATE 18 09/02/2023   Lab Results  Component Value Date   CRP 23.6 (H) 09/02/2023     Assessment/Plan:  38yo F with cardiac device lead infection - with GPC in prs noted on gram stain from device extraction. Imaging shows mild stranding about defibrillator lead and fat stranding about anterior chest wall. Suspect the nidus may have been the nonhealing incision sites. Device and device pocket were clean. Appears to be recent phenomenon. Imaging doesn't show stranding about chest wall, but near it.   - ideally would like her to stay until isolate and sensitivities are determined. Patient is insistent on going home. We will discharge her on 2 wk of linezolid 600mg  po bid. We will follow up in 2wk in clinic. And likely extend abtx additional 2 wk if not healed by then. As  well as follow up on inflammatory markers (sed rate and crp ) to see that they are normalized.  I have personally spent 85 minutes involved in face-to-face and non-face-to-face activities for this patient on the day of the visit. Professional time spent includes the following activities: Preparing to see the patient (review of tests), Obtaining and/or reviewing separately obtained history (admission/discharge record), Performing a medically appropriate examination and/or evaluation , Ordering medications/tests/procedures, referring and communicating with other health care professionals, Documenting clinical information in the EMR, Independently interpreting results (not separately reported), Communicating results to the patient and  conseling to patient and coordinating with providers.

## 2023-09-06 NOTE — Discharge Instructions (Signed)
 Implantable Cardiac Device Extraction, Care After  This sheet gives you information about how to care for yourself after your procedure. Your health care provider may also give you more specific instructions. If you have problems or questions, contact your health care provider.  What can I expect after the procedure? After your procedure, it is common to have: Pain or soreness at the site where the cardiac device was removed. Mild Swelling at the site where the cardiac device was inserted.  Follow these instructions at home: Incision care  Keep the incision clean and dry. Do not take baths, swim, or use a hot tub until after your wound check.  Do not shower for at least 3 full days days, you may shower on 09/09/23. Pat the area dry with a clean towel. Do not rub the area. This may cause bleeding. Follow instructions from your health care provider about how to take care of your incision. Make sure you: Leave stitches (sutures), skin glue, or adhesive strips in place. These skin closures may need to stay in place for 2 weeks or longer.  Change gauze dressing as needed to keep clean and dry Check around your incision area every day for signs of infection. Check for: More redness, swelling, or pain. More fluid or blood. Warmth. Pus or a bad smell. Activity Do not lift anything that is heavier than 10 lb (4.5 kg) until your health care provider says it is okay to do so. For the first week, or as long as told by your health care provider: Avoid lifting your affected arm higher than your shoulder. Avoid strenuous exercise. Ask your health care provider when it is okay to: Resume your normal activities. Return to work or school. Resume sexual activity. Contact a health care provider if: You have any of these around your incision site or coming from it: More redness, swelling, or pain. Fluid or blood. Warmth to the touch. Pus or a bad smell. You have a fever. Get help right away if: You  experience chest pain that is different from the pain at the cardiac device site. You develop a red streak that extends above or below the incision site. You experience shortness of breath. You have light-headedness that does not go away quickly. You faint or have dizzy spells. Your pulse suddenly drops or increases rapidly and does not return to normal. You begin to gain weight and your legs and ankles swell. Summary After your procedure, it is common to have pain, soreness, and some swelling where the cardiac device was removed. Make sure to keep your incision clean and dry. Follow instructions from your health care provider about how to take care of your incision. Check your incision every day for signs of infection, such as more pain or swelling, pus or a bad smell, warmth, or leaking fluid and blood. Avoid strenuous exercise and lifting your left arm higher than your shoulder for 2 weeks, or as long as told by your health care provider. This information is not intended to replace advice given to you by your health care provider. Make sure you discuss any questions you have with your health care provider.

## 2023-09-06 NOTE — Progress Notes (Signed)
 Pharmacy Antibiotic Note  Elizabeth Spencer is a 38 y.o. female admitted on 09/01/2023 with sepsis, possible surgical site infection.  Pharmacy has been consulted for vancomycin  dosing.  Currently #6 of antibiotics - WBC 7.9, afebrile. Scr 0.59 (CrCl >100 mL/min). S/p ICD removed yesterday - superior site cx w/ rare GPC in pairs. ID consulted.   Plan: Vancomycin  1g IV q12h for estimated AUC 492 using SCr 0.9, Vd 0.5 Check vancomycin  levels at steady state, goal AUC 400-550 >> could consider levels this week if plan to continue  Follow up renal function, cultures as available, clinical progress, length of tx  Height: 5\' 7"  (170.2 cm) Weight: 115 kg (253 lb 8.5 oz) IBW/kg (Calculated) : 61.6  Temp (24hrs), Avg:98.3 F (36.8 C), Min:97.8 F (36.6 C), Max:98.6 F (37 C)  Recent Labs  Lab 09/01/23 2050 09/01/23 2057 09/01/23 2250 09/02/23 1037 09/03/23 1453 09/04/23 0336 09/05/23 0448  WBC 25.3*  --   --  17.9* 9.6 8.6 7.9  CREATININE 0.90  --   --  0.60 0.71 0.60 0.59  LATICACIDVEN  --  1.5 0.8  --   --   --   --     Estimated Creatinine Clearance: 122.5 mL/min (by C-G formula based on SCr of 0.59 mg/dL).    Allergies  Allergen Reactions   Azithromycin Swelling and Other (See Comments)   Clindamycin  Swelling   Codeine Hives and Other (See Comments)    Nausea/dizzy    Meloxicam  Other (See Comments)    Burns up from the inside    Neurontin [Gabapentin] Other (See Comments)    Makes her pass out and not remember what happened prior to taking med.   Penicillins Anaphylaxis    Patient allergic to all "cillins" Has patient had a PCN reaction causing immediate rash, facial/tongue/throat swelling, SOB or lightheadedness with hypotension: Yes Has patient had a PCN reaction causing severe rash involving mucus membranes or skin necrosis: Yes Has patient had a PCN reaction that required hospitalization: No Has patient had a PCN reaction occurring within the last 10 years: Yes If all  of the above answers are "NO", then may proceed with Cephalosporin use.    Clindamycin /Lincomycin Swelling   Other Hives and Other (See Comments)    Berry flavored food and drinks   Diphenhydramine  Hcl Rash and Other (See Comments)    Uses dye-free; allergic to dye in regular benadryl    Doxycycline Rash   Egg-Derived Products Rash    Antimicrobials this admission: Vanc 5/29 >> Cefepime  5/29 >> 6/3  Dose adjustments this admission: N/A  Microbiology results: 5/29 blood>> ngtd 5/29 chest wound cx>> pending (GPC in pairs) 5/30 MRSA > neg 6/2 wound device cx > pending  Thank you for allowing pharmacy to participate in this patient's care,  Nieves Bars, PharmD, BCCCP Clinical Pharmacist  Phone: [phone] 09/06/2023 11:57 AM  Please check AMION for all [facility] Pharmacy phone numbers After 10:00 PM, call Main Pharmacy [phone]

## 2023-09-06 NOTE — Discharge Summary (Signed)
 [facility] Discharge Summary   Patient: Elizabeth Spencer GUY:[id number]   PCP: Trenda Frisk, FNP DOB: 06-Feb-1986   Date of admission: 09/01/2023   Date of discharge: 09/06/2023    Discharge Diagnoses:  Principal Problem:   Sepsis [facility]) Active Problems:   Postoperative infection   Surgical site infection   Tachycardia  Discharge Condition: patient left AMA  History of present illness: As per the H and P dictated on admission, " Elizabeth Spencer is a 38 y.o. female with medical history significant for ICM/HFrEF, CAD s/p CABG, morbid obesity, T2DM, HLD, HTN, asthma, migraines, hyperthyroidism, PTSD, anxiety and recent SQ ICD placement 08/08/23 with lead revision 08/09/23 who presents to the ED for evaluation of chest wall pain, nausea, vomiting and fevers.  Patient reports that since she had her ICU placed 3 weeks ago, she has had problems with bleeding from the site.  She has had multiple follow-ups with cardiology with redressing of the wound without significant improvement.  She was evaluated by cardiology at the office today has significant tenderness to palpation of left chest wall.  After she went home, she continue to have bleeding with some drainage from the site and felt fatigued so she presented to the ED.  She also reports some associated nausea and vomiting and subjective fevers and chills since yesterday.  She denies any abdominal pain, headaches, dizziness, shortness of breath or palpitations."  Hospital Course:  PMH of CAD SP CABG, type II DM, HLD, HTN, HFrEF NICM SP ICD implant, PTSD, hyperthyroidism on methimazole  present to the hospital with nonhealing wound after ICD lead revision with nausea vomiting and fever. Currently being admitted for sepsis secondary to wound infection. EP following. Currently recommending conservative measures with IV antibiotics. Assessment and Plan: Sepsis secondary to surgical site infection. Recent ICD placement. Presented with fever nausea  vomiting.  Had leukocytosis.  Met SIRS criteria. Receiving IV antibiotics. Blood cultures so far no growth. MRSA PCR negative which is reassuring. Given surgical intervention I would still continue IV vancomycin  and IV cefepime . CT chest negative for any drainable abscess but likely has skin seroma. EP recommending removal of the pacemaker. Procedure completed on 6/2.  Pt's culture growing gram positive cocci.  ID was consulted..  Pt wanted to leave on 6/3.  Pt was informed that we do not have information about the type of bacteria and Antibiotics sensitivity. And without that the risk of infection getting worse is very high.  Pt still wanted to go home.  ID arranged zyvox for the pt and pt left AMA.    Ischemic cardiomyopathy SP ICD implant HFrEF CAD CABG Management per cardiology.  Currently no evidence of active angina. Volume status adequate as well. Recent EF 25 to 30%. Continue Toprol -XL.   Hyperthyroidism. On methimazole  daily.   Type 2 diabetes mellitus, well-controlled without long-term insulin  use. On Farxiga  was on hold.   Anxiety. On scheduled long-acting Xanax  at home. Was on Xanax  as needed.   Obesity class I. Body mass index is 39.71 kg/m.  Placing the patient at high risk for poor outcome.   HLD. Continuing statin.   Hypokalemia. Replaced orally.    Chronic nausea. Able to tolerate diet  Summary of her active problems in the hospital is as following.  patient left AMA  Procedures and Results: Device removal   Consultations: ID  Cardiology   The results of significant diagnostics from this hospitalization (including imaging, microbiology, ancillary and laboratory) are listed below for reference.  Significant Diagnostic Studies: EP PPM/ICD IMPLANT Result Date: 09/06/2023   CONCLUSIONS:  1. Successful extraction of S-ICD for soft tissue pocket infection.  2. No early apparent complications. [name], MD, [facility], [facility] Cardiac  Electrophysiology   DG Chest Port 1 View Result Date: 09/05/2023 CLINICAL DATA:  Cardiac device in situ EXAM: PORTABLE CHEST 1 VIEW COMPARISON:  09/01/2023 FINDINGS: Interval removal of the patient's prior subcutaneous ICD. Mild left lower lobe opacity, likely atelectasis. Right lung is clear No pleural effusion or pneumothorax. Heart is normal in size. Median sternotomy. IMPRESSION: Interval removal of the patient's prior subcutaneous ICD. Mild left lower lobe opacity, likely atelectasis. Electronically Signed   By: Zadie Herter M.D.   On: 09/05/2023 21:22   CT Angio Chest PE W and/or Wo Contrast Result Date: 09/02/2023 CLINICAL DATA:  Chest wall abscess, tachycardia, fever. Drainage from chest wall at defibrillator incision site. EXAM: CT ANGIOGRAPHY CHEST WITH CONTRAST TECHNIQUE: Multidetector CT imaging of the chest was performed using the standard protocol during bolus administration of intravenous contrast. Multiplanar CT image reconstructions and MIPs were obtained to evaluate the vascular anatomy. RADIATION DOSE REDUCTION: This exam was performed according to the departmental dose-optimization program which includes automated exposure control, adjustment of the mA and/or kV according to patient size and/or use of iterative reconstruction technique. CONTRAST:  75mL OMNIPAQUE  IOHEXOL  350 MG/ML SOLN COMPARISON:  CTA chest 08/31/2022 and radiographs 09/01/2023 FINDINGS: Cardiovascular: Left posterior chest wall defibrillator. Streak artifact limits assessment. No definite fluid collection at the defibrillator. There is mild stranding along the course of the defibrillator lead. Thickening and vague edema within the medial left pectoralis major muscle at the tip of the defibrillator lead (circa series 7/image 103). Adjacent subcutaneous fat stranding and free fluid in the subcutaneous fat of the anterior chest wall suggestive of surgical site infection. No drainable fluid collection or soft tissue gas.  Normal heart size. No pericardial effusion. Sternotomy and CABG. Normal caliber thoracic aorta. Mediastinum/Nodes: Multiple thyroid  nodules. The largest measures 2.2 cm in the right thyroid  lobe. Trachea and esophagus are unremarkable. No evidence of mediastinal fluid or abscess. Lungs/Pleura: Bibasilar atelectasis/scarring. No focal consolidation, pleural effusion, or pneumothorax. Upper Abdomen: No acute abnormality. Musculoskeletal: No acute fracture Review of the MIP images confirms the above findings. IMPRESSION: 1. Findings compatible with surgical site infection in the anterior chest wall. Vague edema and thickening of the medial left pectoralis major muscle suggest myositis. No drainable fluid collection. 2. Multiple thyroid  nodules. The largest measures 2.2 cm in the right thyroid  lobe. Recommend nonemergent thyroid  US  (ref: J Am Coll Radiol. 2015 Feb;12(2): 143-50). Electronically Signed   By: Rozell Cornet M.D.   On: 09/02/2023 01:01   DG Chest Portable 1 View Result Date: 09/01/2023 CLINICAL DATA:  Postop.  Defibrillator placed 5/5 EXAM: PORTABLE CHEST 1 VIEW COMPARISON:  Most recent radiograph 08/09/2023 FINDINGS: Stable positioning of subcutaneous pacemaker with battery pack on the left. Prior median sternotomy. Stable heart size and mediastinal contours. Mild atelectasis at the left lung base. No acute airspace disease. No pleural fluid or pneumothorax. IMPRESSION: 1. Stable positioning of subcutaneous pacemaker. No pneumothorax. 2. Mild left basilar atelectasis. Electronically Signed   By: Chadwick Colonel M.D.   On: 09/01/2023 21:11   EP PPM/ICD IMPLANT Result Date: 08/10/2023  CONCLUSIONS:  1. Successful subcutaneous ICD lead revision, utilizing a 2 incision technique. The distal lead tip was secured to the fascia.  2. No early apparent complications. [name], MD,  [facility], [facility] 08/10/2023 7:31 AM  DG Chest 1V REPEAT Same Day Result Date: 08/09/2023 CLINICAL DATA:  [id number] ICD  (implantable cardioverter-defibrillator) in place [id number]. EXAM: CHEST - 1 VIEW SAME DAY COMPARISON:  08/09/2023, 3:27 p.m. FINDINGS: Lateral views of the chest. Neurostimulator device battery pack seen overlying the posterior lower chest with the lead extending anteriorly along the sternum. IMPRESSION: *Neurostimulator device battery pack seen overlying the posterior lower chest with the lead extending anteriorly along the sternum. Electronically Signed   By: Beula Brunswick M.D.   On: 08/09/2023 16:50   DG CHEST PORT 1 VIEW Result Date: 08/09/2023 CLINICAL DATA:  [id number] ICD (implantable cardioverter-defibrillator) in place [id number] EXAM: PORTABLE CHEST 1 VIEW COMPARISON:  08/09/2023, 5:20 a.m. FINDINGS: Low lung volume. There are atelectatic changes at the lung bases, left more than right. Bilateral lung fields are otherwise clear. No acute consolidation or lung collapse. Bilateral costophrenic angles are clear. No pneumothorax. Stable cardio-mediastinal silhouette. Redemonstration of ICD with its battery pack overlying the left lower lateral chest wall. No acute osseous abnormalities. The soft tissues are within normal limits. IMPRESSION: *No active disease. *Redemonstration of ICD.  No pneumothorax. Electronically Signed   By: Beula Brunswick M.D.   On: 08/09/2023 15:36   DG Chest 2 View Result Date: 08/09/2023 CLINICAL DATA:  Cardiac device in-situ EXAM: CHEST - 2 VIEW COMPARISON:  May 25, 2023 FINDINGS: Prior median sternotomy. Pericardial pacer device appears in anatomic alignment. No pneumothorax Heart normal size No infiltrates or consolidations. IMPRESSION: No active cardiopulmonary disease. Electronically Signed   By: Fredrich Jefferson M.D.   On: 08/09/2023 09:18   EP PPM/ICD IMPLANT Result Date: 08/08/2023  CONCLUSIONS:  1. NYHA class II chronic systolic heart failure.  2. Successful subcutaneous ICD implantation for primary prevention.  3. Successful DFT testing with single 65J shock.  4. No early  apparent complications. [name], MD, [facility], [facility] 08/08/2023 6:14 PM    Microbiology: Recent Results (from the past 240 hours)  Blood Culture (routine x 2)     Status: None   Collection Time: 09/01/23  8:29 PM   Specimen: BLOOD RIGHT HAND  Result Value Ref Range Status   Specimen Description BLOOD RIGHT HAND  Final   Special Requests   Final    BOTTLES DRAWN AEROBIC AND ANAEROBIC Blood Culture adequate volume   Culture   Final    NO GROWTH 5 DAYS Performed at [facility], [address], [place], [place] [address]    Report Status 09/06/2023 FINAL  Final  Blood Culture (routine x 2)     Status: None   Collection Time: 09/01/23  8:34 PM   Specimen: BLOOD  Result Value Ref Range Status   Specimen Description BLOOD RIGHT ANTECUBITAL  Final   Special Requests   Final    BOTTLES DRAWN AEROBIC AND ANAEROBIC Blood Culture adequate volume   Culture   Final    NO GROWTH 5 DAYS Performed at [facility], [address] [place], [place] [address]    Report Status 09/06/2023 FINAL  Final  MRSA Next Gen by PCR, Nasal     Status: None   Collection Time: 09/02/23  7:28 AM   Specimen: Nasal Mucosa; Nasal Swab  Result Value Ref Range Status   MRSA by PCR Next Gen NOT DETECTED NOT DETECTED Final    Comment: (NOTE) The GeneXpert MRSA Assay (FDA approved for NASAL specimens only), is one component of a comprehensive MRSA colonization surveillance program. It is not intended to diagnose MRSA infection  nor to guide or monitor treatment for MRSA infections. Test performance is not FDA approved in patients less than 90 years old. Performed at [facility], [address], [place], [place] [address]   Aerobic/Anaerobic Culture w Gram Stain (surgical/deep wound)     Status: None (Preliminary result)   Collection Time: 09/05/23  4:17 PM   Specimen: Wound  Result Value Ref Range Status   Specimen Description WOUND  Final   Special Requests SUBSTERNAL  Final   Gram Stain    Final    FEW WBC PRESENT, PREDOMINANTLY PMN RARE GRAM POSITIVE COCCI IN PAIRS CRITICAL RESULT CALLED TO, READ BACK BY AND VERIFIED WITH: DR. Daneil Dunker [id number] @ 1807 FH Performed at [facility], [address] [place], [place] [address]    Culture PENDING  Incomplete   Report Status PENDING  Incomplete  Aerobic Culture w Gram Stain (superficial specimen)     Status: None (Preliminary result)   Collection Time: 09/05/23  4:36 PM   Specimen: Wound  Result Value Ref Range Status   Specimen Description WOUND  Final   Special Requests SUBXIPHOID  Final   Gram Stain   Final    RARE WBC PRESENT, PREDOMINANTLY PMN NO ORGANISMS SEEN    Culture   Final    CULTURE REINCUBATED FOR BETTER GROWTH Performed at [facility], [address], [place], [place] [address]    Report Status PENDING  Incomplete  Aerobic/Anaerobic Culture w Gram Stain (surgical/deep wound)     Status: None (Preliminary result)   Collection Time: 09/05/23  4:56 PM   Specimen: Wound  Result Value Ref Range Status   Specimen Description WOUND  Final   Special Requests DEVICE  Final   Gram Stain NO WBC SEEN NO ORGANISMS SEEN   Final   Culture   Final    NO GROWTH < 24 HOURS Performed at [facility], [address], [place], [place] [address]    Report Status PENDING  Incomplete     Labs: CBC: Recent Labs  Lab 09/01/23 2050 09/02/23 1037 09/03/23 1453 09/04/23 0336 09/05/23 0448  WBC 25.3* 17.9* 9.6 8.6 7.9  NEUTROABS 21.7* 13.9* 6.2 5.1 4.0  HGB 15.4* 13.4 13.5 12.9 13.6  HCT 46.3* 40.2 40.8 39.0 42.2  MCV 89.2 89.1 89.9 89.7 89.8  PLT 217 172 185 181 237   Basic Metabolic Panel: Recent Labs  Lab 09/01/23 2050 09/02/23 1037 09/03/23 1453 09/04/23 0336 09/05/23 0448  NA 131* 133* 137 137 138  K 3.6 3.4* 3.8 3.7 3.5  CL 101 106 109 110 108  CO2 18* 16* 20* 17* 18*  GLUCOSE 111* 94 100* 89 94  BUN 8 6 7 6 6   CREATININE 0.90 0.60 0.71 0.60 0.59  CALCIUM  8.9 8.6* 8.8* 8.6* 8.8*   MG  --  1.8 1.8 1.7 1.8   Liver Function Tests: Recent Labs  Lab 09/01/23 2050 09/03/23 1453 09/04/23 0336 09/05/23 0448  AST 26 15 12* 11*  ALT 32 18 15 13   ALKPHOS 73 69 59 62  BILITOT 1.4* 0.5 0.9 0.7  PROT 7.0 6.3* 5.8* 6.2*  ALBUMIN  3.7 2.9* 2.7* 2.9*   No results for input(s): "LIPASE", "AMYLASE" in the last 168 hours. No results for input(s): "AMMONIA" in the last 168 hours. Cardiac Enzymes: No results for input(s): "CKTOTAL", "CKMB", "CKMBINDEX", "TROPONINI" in the last 168 hours. BNP (last 3 results) No results for input(s): "BNP" in the last 8760 hours. CBG:  No results for input(s): "GLUCAP" in the last 168 hours. Time spent: 35 minutes  Signed:  Charlean Congress  [facility] 09/06/2023, 10:59 PM

## 2023-09-06 NOTE — Progress Notes (Addendum)
 Rounding Note   Patient Name: Elizabeth Spencer Date of Encounter: 09/06/2023  [facility] Cardiologist: Sammy Crisp, MD   Subjective  Sore at procedure sites, hopes to go home today  Scheduled Meds:  aspirin   81 mg Oral Daily   atorvastatin   40 mg Oral Daily   loratadine   10 mg Oral QHS   methimazole   15 mg Oral Daily   metoprolol  succinate  25 mg Oral Daily   montelukast   10 mg Oral Daily   nicotine   7 mg Transdermal Daily   pantoprazole   40 mg Oral QHS   sodium chloride  flush  3 mL Intravenous Q12H   Continuous Infusions:  ceFEPime  (MAXIPIME ) IV 2 g (09/06/23 0635)   vancomycin  1,000 mg (09/06/23 0042)   PRN Meds: acetaminophen  **OR** acetaminophen , ALPRAZolam , HYDROmorphone  (DILAUDID ) injection, ondansetron  **OR** ondansetron  (ZOFRAN ) IV, mouth rinse, oxyCODONE , senna-docusate   Vital Signs  Vitals:   09/05/23 2032 09/06/23 0133 09/06/23 0527 09/06/23 0717  BP: 109/69 113/82 114/60 117/71  Pulse: 90 100 92 96  Resp: 18 17 19 16   Temp: 98.5 F (36.9 C) 98.5 F (36.9 C) 98.6 F (37 C) 97.8 F (36.6 C)  TempSrc: Oral Oral Oral Oral  SpO2: 98% 98% 96% 95%  Weight:   115 kg   Height:        Intake/Output Summary (Last 24 hours) at 09/06/2023 0914 Last data filed at 09/06/2023 0845 Gross per 24 hour  Intake 1960 ml  Output 700 ml  Net 1260 ml      09/06/2023    5:27 AM 09/01/2023    7:39 PM 08/25/2023    3:07 PM  Last 3 Weights  Weight (lbs) 253 lb 8.5 oz 250 lb 7.1 oz 250 lb 9.6 oz  Weight (kg) 115 kg 113.6 kg 113.671 kg      Telemetry SR 80's-90's - Personally Reviewed  ECG  No new EKGs - Personally Reviewed  Physical Exam  GEN: No acute distress.   Neck: No JVD Cardiac: RRR, no murmurs, rubs, or gallops.  Respiratory: Clear to auscultation bilaterally. Superior sternal site: single prolene stitch remains, drained expressed by  Dr. Daneil Dunker, once completed, no bleeding/drainage, clean gauze applied Inferior sternal/subxiphoid site:  stiches in place, no bleeding/drainage L lateral thorax site: stitches in place, clean and dry GI: Soft, nontender, non-distended  MS: No edema; No deformity. Neuro:  Nonfocal  Psych: Normal affect   Labs High Sensitivity Troponin:   Recent Labs  Lab 09/01/23 2050 09/01/23 2245  TROPONINIHS 5 5     Chemistry Recent Labs  Lab 09/03/23 1453 09/04/23 0336 09/05/23 0448  NA 137 137 138  K 3.8 3.7 3.5  CL 109 110 108  CO2 20* 17* 18*  GLUCOSE 100* 89 94  BUN 7 6 6   CREATININE 0.71 0.60 0.59  CALCIUM  8.8* 8.6* 8.8*  MG 1.8 1.7 1.8  PROT 6.3* 5.8* 6.2*  ALBUMIN  2.9* 2.7* 2.9*  AST 15 12* 11*  ALT 18 15 13   ALKPHOS 69 59 62  BILITOT 0.5 0.9 0.7  GFRNONAA >60 >60 >60  ANIONGAP 8 10 12     Lipids No results for input(s): "CHOL", "TRIG", "HDL", "LABVLDL", "LDLCALC", "CHOLHDL" in the last 168 hours.  Hematology Recent Labs  Lab 09/03/23 1453 09/04/23 0336 09/05/23 0448  WBC 9.6 8.6 7.9  RBC 4.54 4.35 4.70  HGB 13.5 12.9 13.6  HCT 40.8 39.0 42.2  MCV 89.9 89.7 89.8  MCH 29.7 29.7 28.9  MCHC 33.1 33.1 32.2  RDW 13.4 13.2 13.2  PLT 185 181 237   Thyroid   Recent Labs  Lab 09/02/23 1037  TSH 0.022*  FREET4 1.12    BNPNo results for input(s): "BNP", "PROBNP" in the last 168 hours.  DDimer No results for input(s): "DDIMER" in the last 168 hours.   Radiology  DG Chest Port 1 View Result Date: 09/05/2023 CLINICAL DATA:  Cardiac device in situ EXAM: PORTABLE CHEST 1 VIEW COMPARISON:  09/01/2023 FINDINGS: Interval removal of the patient's prior subcutaneous ICD. Mild left lower lobe opacity, likely atelectasis. Right lung is clear No pleural effusion or pneumothorax. Heart is normal in size. Median sternotomy. IMPRESSION: Interval removal of the patient's prior subcutaneous ICD. Mild left lower lobe opacity, likely atelectasis. Electronically Signed   By: Zadie Herter M.D.   On: 09/05/2023 21:22    Cardiac Studies  02/24/23: TTE 1. Left ventricular ejection  fraction, by estimation, is 30 to 35%. The  left ventricle has moderately decreased function. The left ventricle  demonstrates global hypokinesis. Left ventricular diastolic parameters are  consistent with Grade I diastolic  dysfunction (impaired relaxation). The average left ventricular global  longitudinal strain is -9.4 %.   2. Right ventricular systolic function is normal. The right ventricular  size is normal. Tricuspid regurgitation signal is inadequate for assessing  PA pressure.   3. The mitral valve is normal in structure. Moderate mitral valve  regurgitation. No evidence of mitral stenosis.   4. The aortic valve is normal in structure. Aortic valve regurgitation is  not visualized. No aortic stenosis is present.   5. The inferior vena cava is normal in size with greater than 50%  respiratory variability, suggesting right atrial pressure of 3 mmHg.   Patient Profile   38 y.o. female w/PMHx of  CAD (CABG may 2025), HTN, HLD, DM II,  Asthma, migraine HAs, hypothyroidism, PTSD/anxiety ICM, chronic CHF (systolic), SICD  Admitted 2/2 ICD electrode incision infection/pain/drainage  Assessment & Plan   S-ICD electrode incision site (presumed) infection/drainage System will need to be removed  BC (x2) neg to date (5 days) Afebrile Leukocytosis > resolved On cefepime  and vanc S/p ICD system removal yesterday  Superior incision site had appearance of possible infection, other 2 sites looked clean during procedure  Superior site cx w/RARE GRAM POSITIVE COCCI IN PAIRS  ID will see her today to make recommendations for antibiotics/duration course going forward   Dr. Daneil Dunker has been bedside Prolene sutures in place >> office follow up in place for removal Loose clean dressing to cover for comfort (change as needed) Superior sternal wound one stitch removed this morning to allow drainage/secondary intention healing > reviewed with patient on detail Change dressing as needed to  keep clean  Site/wound care and activity restrictions were reviewed with the patient at length OK to cover sutures/sites for comfort/protect sutures from clothing Change dressing as needed to keep clean/dry (and shown how) She has expressed that she does not want to proceed "anytime soon" with new ICD implant, understandable She was primary prevention > will follow in the office of course and proceed when she is ready  Wound visit/suture removal and MD follow up are in place  2. CAD No symptoms/CP Do not resume Plavix  until 7 days post procedure ASA is OK  3. ICM Some DOE yesterday in the hall Lungs are clear, not edematous No obvious volume OL    OK to discharge from EP perspective when ready medically otherwise ID team will see her to guide  antibiotic management  Further care as per IM team   Signed, [name], PA-C  09/06/2023, 9:14 AM    I have seen, examined the patient, and reviewed the above assessment and plan.    Interval: Patient underwent successful S-ICD explant yesterday.  No acute overnight events.  Patient has some soreness at her incision sites.  Pain controlled with IV medications.  Otherwise doing relatively well with no new or acute complaints.  General: Well developed, in no acute distress.  Neck: No JVD.  Cardiac: Normal rate, regular rhythm.  Chest: Subxiphoid and mid axillary incisions are soft without bleeding or drainage.  Suprasternal incision left open with serous drainage. Resp: Normal work of breathing.  Ext: No edema.  Neuro: No gross focal deficits.  Psych: Normal affect.   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  During explant lower incision and mid axillary incision appeared relatively clean.  However, suprasternal incision had purulent drainage upon opening.  Culture from suprasternal incision has grown gram-positive cocci in pairs.  Suspect this is surgical wound infection  from skin flora, although further speciation and susceptibilities pending.  Subxiphoid and mid axillary incisions were closed with interrupted Prolene sutures.  Suprasternal incision was left open to close by secondary intention in effort to minimize risk of abscess formation. - No immediate plans for ICD reimplantation.  Risk and benefits of wearable defibrillator were discussed with patient and she is not interested.  This is reasonable given that device was for primary prevention. - ID to see patient today to provide antibiotic recommendations and duration of treatment. - Appreciate primary team assistance with pain management.  -We will schedule for patient to be seen in 2 weeks for wound check and possible removal of Prolene sutures. -Extensive instructions regarding wound care were provided in efforts to minimize risk of further infection.   #Chronic systolic heart failure: Well compensated.  #Ishemic cardiomyopathy: - Continue metoprolol , additional GDMT has been limited by baseline low blood pressure.  This is a chronic problem.     [name], MD 09/06/2023 9:04 PM

## 2023-09-06 NOTE — Progress Notes (Signed)
 Spent time w/ pt; explained benefits of staying; patient wanting to leave. IV was d/c by RN and monitor removed. She has a water bill to pay, concerned w/ diet order of low NA vs heart healthy; concerned one MD told her it was okay to take PO antibiotic at home; advised of MD advice to stay. She states she wants to leave. AMA form reviewed and signed by pt. --JM

## 2023-09-07 ENCOUNTER — Encounter (HOSPITAL_COMMUNITY): Payer: Self-pay | Admitting: Cardiology

## 2023-09-08 LAB — AEROBIC CULTURE W GRAM STAIN (SUPERFICIAL SPECIMEN)

## 2023-09-08 NOTE — Telephone Encounter (Signed)
 Pt is requesting a callback regarding the incision on her stomach leaking a "clear blood". She'd like to know if this is normal. She'd like to be called at [phone]. Please advise

## 2023-09-08 NOTE — Telephone Encounter (Signed)
 Successful outreach made to patient via telephone  Patient instructed that the "clear blood" she was seeing was serosanguinous drainage and is completely normal.   This RN requested patient to check dressing's current condition  Patient removed dressing over abdominal site and stated, "the dressing is pretty much empty from what it was before" and still only had a small amount of clear/slightly pink drainage.  This RN encouraged the patient to call back with any further issues, and if she noticed large amounts of "bright red, frank blood" from her incision site to seek medical attention immediately via a hospital ED.   Patient acknowledged understanding of instructions provided by RN  All questions answered  Patient appreciative of call

## 2023-09-09 ENCOUNTER — Ambulatory Visit: Payer: Self-pay

## 2023-09-10 LAB — AEROBIC/ANAEROBIC CULTURE W GRAM STAIN (SURGICAL/DEEP WOUND): Gram Stain: NONE SEEN

## 2023-09-12 ENCOUNTER — Telehealth: Payer: Self-pay | Admitting: Cardiology

## 2023-09-12 NOTE — Telephone Encounter (Signed)
 ERRONEOUS ENCOUNTER

## 2023-09-12 NOTE — Telephone Encounter (Signed)
 Spoke with pt who is concerned with her wounds leaking.  Pt states that her chest wound is sore and is not leaking anymore, but she is keeping it covered.  States that her stomach wound started leaking last week with clear fluid, then stated it looked like "kool-aid" and then it turned "dark red with black stuff coming out" which she stated that she talked to someone and they told her that it was normal.  She states now it is dark red and no matter if she sits, stand, or lays down it still leaks.  She also states that the side incision started leaking last night (09/11/2023) when she was sleeping and it is clear, but pouring down her side.  She states that both stomach and side incisions are burning and itching.  She states that she applied pressure to both stomach and side wounds, but didn't know how much pressure to apply and that they are still leaking.  Pt denies odor, warm to touch, redness, or thick drainage.  Pt states that she left hospital AMA because she couldn't take it anymore, but they gave her antibiotic Linezolid  600mg  PO before she left.   States that she has a follow up appointment with her PCP on Wednesday, 6/11.  She also asked for me to review the other follow up appointments that she has because she doesn't know how to use MyChart and had received messages.  I gave her all the follow up appointments she has listed with addresses, dates and times.  She wanted to know results of the "infection lab work"  I told her that would be discussed at the follow up appointment with infectious disease on 6/16. She stated that she would like to be called and discuss the time of her appointments because she doesn't like morning appointments.  I reiterated the importance of continuing her antibiotic, keeping her incisions covered and not scratching them, and to go to all of her follow up appointments.  Pt verbalized understanding.

## 2023-09-12 NOTE — Telephone Encounter (Signed)
 Patient states that her wound sites (stomach, chest and side) are draining.  The defib site on her side/abdomen is draining extensively (light bloody drainage) and she has had to change out bandage multiple times.  She refuses to come to clinic in [place] today or go to ER in [place] and will only go to see provider in [place] due to transportation issues.  I have scheduled her with Dr. Daneil Dunker tomorrow am to check the site.  (This is a double book).  Will forward to Dr. Daneil Dunker to make him aware.

## 2023-09-12 NOTE — Progress Notes (Signed)
 Electrophysiology Office Note:   Date:  09/12/2023  ID:  Elizabeth Spencer, DOB [birth year], MRN [id number]  Primary Cardiologist: Sammy Crisp, MD Electrophysiologist: [name], MD  {Click to update primary MD,subspecialty MD or APP then REFRESH:1}    History of Present Illness:   Elizabeth Spencer is a 38 y.o. 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 She is being seen today for wound check.  Discussed the use of AI scribe software for clinical note transcription with the patient, who gave verbal consent to proceed.  History of Present Illness     Review of systems complete and found to be negative unless listed in HPI.   EP Information / Studies Reviewed:    EKG is not ordered today. EKG from 09/01/23 reviewed which showed sinus tach.      Echo 02/24/23:  1. Left ventricular ejection fraction, by estimation, is 30 to 35%. The  left ventricle has moderately decreased function. The left ventricle  demonstrates global hypokinesis. Left ventricular diastolic parameters are  consistent with Grade I diastolic  dysfunction (impaired relaxation). The average left ventricular global  longitudinal strain is -9.4 %.   2. Right ventricular systolic function is normal. The right ventricular  size is normal. Tricuspid regurgitation signal is inadequate for assessing  PA pressure.   3. The mitral valve is normal in structure. Moderate mitral valve  regurgitation. No evidence of mitral stenosis.   4. The aortic valve is normal in structure. Aortic valve regurgitation is  not visualized. No aortic stenosis is present.   5. The  inferior vena cava is normal in size with greater than 50%  respiratory variability, suggesting right atrial pressure of 3 mmHg.         Physical Exam:   VS:  LMP 08/22/2017 Comment: says she just spots.   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  During explant lower incision and mid axillary incision appeared relatively clean.  However, suprasternal incision had purulent drainage upon opening.  Culture from suprasternal incision has grown gram-positive cocci in pairs.  Suspect this is surgical wound infection from skin flora, although further speciation and susceptibilities pending.  Subxiphoid and mid axillary incisions were closed with interrupted Prolene sutures.  Suprasternal incision was left open to close by secondary intention in effort to minimize risk of abscess formation. - No immediate plans for ICD reimplantation.  Risk and benefits of wearable defibrillator were discussed with patient and she is not interested.  This is reasonable given that device was for primary prevention. - ID to see patient today to provide antibiotic recommendations and duration of treatment. - Appreciate primary team assistance with pain management.  -We will schedule for patient to be seen in 2 weeks  for wound check and possible removal of Prolene sutures. -Extensive instructions regarding wound care were provided in efforts to minimize risk of further infection.  #.  Chronic systolic heart failure: EF remains less than 35% greater than 90 days  after revascularization despite optimal medical therapy. NYHA II symptoms. #.  Ischemic cardiomyopathy: -Patient meets criteria for primary prevention ICD. Explained risks, benefits, and alternatives to ICD implantation, including but not limited to bleeding, infection, damage to heart or lungs, heart attack, stroke, or death. We discussed S-ICD to minimize infectious risk and venous thrombosis. Patient voiced understanding and would like to think about her options. -Continue GDMT-Farxiga  10 mg once daily, metoprolol  XL 25 mg once daily, spironolactone  25 mg once daily-and follow-up with primary cardiologist. -Start lasix  as needed.  - Refer to advanced heart failure team.    #.  CAD status post 1vCABG (LIMA-LAD): -Continue aspirin  81mg  once daily, clopidogrel  75mg  once daily, atorvastatin  40mg  once daily.   Follow up with {EPMDS:28135::"EP Team"} {EPFOLLOW ZO:10960}  Signed, [name], MD

## 2023-09-12 NOTE — Telephone Encounter (Signed)
 Pt states that both incisions from defib procedure are leaking and is requesting cb to further discuss if she needs to be seen

## 2023-09-13 ENCOUNTER — Ambulatory Visit: Payer: MEDICAID | Attending: Cardiology | Admitting: Cardiology

## 2023-09-13 DIAGNOSIS — I255 Ischemic cardiomyopathy: Secondary | ICD-10-CM

## 2023-09-13 DIAGNOSIS — I2510 Atherosclerotic heart disease of native coronary artery without angina pectoris: Secondary | ICD-10-CM | POA: Diagnosis not present

## 2023-09-13 DIAGNOSIS — T827XXD Infection and inflammatory reaction due to other cardiac and vascular devices, implants and grafts, subsequent encounter: Secondary | ICD-10-CM

## 2023-09-13 DIAGNOSIS — I5022 Chronic systolic (congestive) heart failure: Secondary | ICD-10-CM

## 2023-09-13 MED ORDER — TRAMADOL HCL 50 MG PO TABS
50.0000 mg | ORAL_TABLET | Freq: Three times a day (TID) | ORAL | 0 refills | Status: DC
Start: 2023-09-13 — End: 2023-09-14

## 2023-09-13 NOTE — Patient Instructions (Signed)
 Medication Instructions:  Your physician recommends that you continue on your current medications as directed. Please refer to the Current Medication list given to you today.  *If you need a refill on your cardiac medications before your next appointment, please call your pharmacy*  Follow-Up: At [facility], you and your health needs are our priority.  As part of our continuing mission to provide you with exceptional heart care, our providers are all part of one team.  This team includes your primary Cardiologist (physician) and Advanced Practice Providers or APPs (Physician Assistants and Nurse Practitioners) who all work together to provide you with the care you need, when you need it.  Your next appointment:   As scheduled

## 2023-09-14 ENCOUNTER — Telehealth: Payer: Self-pay | Admitting: Cardiology

## 2023-09-14 ENCOUNTER — Ambulatory Visit: Payer: MEDICAID | Admitting: Family

## 2023-09-14 MED ORDER — TRAMADOL HCL 50 MG PO TABS
50.0000 mg | ORAL_TABLET | Freq: Three times a day (TID) | ORAL | 0 refills | Status: DC
Start: 2023-09-14 — End: 2023-10-07

## 2023-09-14 NOTE — Telephone Encounter (Signed)
*  STAT* If patient is at the pharmacy, call can be transferred to refill team.   1. Which medications need to be refilled? (please list name of each medication and dose if known)   traMADol  (ULTRAM ) 50 MG tablet     2. Would you like to learn more about the convenience, safety, & potential cost savings by using the [facility]? No      3. Are you open to using the [facility] (Type [facility]. ). No    4. Which pharmacy/location (including street and city if local pharmacy) is medication to be sent to? [facility] - [place], [place] - [address]     5. Do they need a 30 day or 90 day supply? 30 days   Per pharmacy they did not receive this prescription. Pt need it today. She requested to call her once the prescription has been sent

## 2023-09-14 NOTE — Telephone Encounter (Signed)
 This Pt of Dr. Daneil Dunker was seen yesterday. She is requesting a refill of Tramadol  which is not a Cardiac RX. Does Dr. Daneil Dunker want to refill? Please advise.

## 2023-09-14 NOTE — Telephone Encounter (Signed)
 Spoke with the patient's pharmacy and gave verbal orders for a refill of tramadol .  Called patient and she is aware.

## 2023-09-19 ENCOUNTER — Encounter: Payer: Self-pay | Admitting: Internal Medicine

## 2023-09-19 ENCOUNTER — Telehealth: Payer: Self-pay

## 2023-09-19 ENCOUNTER — Other Ambulatory Visit: Payer: Self-pay

## 2023-09-19 ENCOUNTER — Ambulatory Visit (INDEPENDENT_AMBULATORY_CARE_PROVIDER_SITE_OTHER): Payer: MEDICAID | Admitting: Internal Medicine

## 2023-09-19 VITALS — BP 119/79 | HR 109 | Resp 16 | Ht 67.0 in | Wt 248.0 lb

## 2023-09-19 DIAGNOSIS — R110 Nausea: Secondary | ICD-10-CM

## 2023-09-19 DIAGNOSIS — B9561 Methicillin susceptible Staphylococcus aureus infection as the cause of diseases classified elsewhere: Secondary | ICD-10-CM | POA: Diagnosis not present

## 2023-09-19 DIAGNOSIS — T8149XA Infection following a procedure, other surgical site, initial encounter: Secondary | ICD-10-CM

## 2023-09-19 MED ORDER — ONDANSETRON HCL 4 MG/2ML IJ SOLN: 4.0000 mg | Freq: Once | INTRAMUSCULAR | Status: AC

## 2023-09-19 NOTE — Progress Notes (Signed)
 Patient ID: Elizabeth Spencer, female   DOB: 1986-01-05, 38 y.o.   MRN: [id number]  HPI Iylah Dworkin is a 38yo F who had recent SQ ICD placement 08/08/23 with lead revision 08/09/23 who presents to the ED on 5/30 for evaluation of chest wall pain, drainage from incision, nausea, vomiting and fevers. Concern for S-ICD with infection of lower sternal incision. She had device explanted on 6/2. The subxyphoid and substernal wound cx grew a few MSSA but the device culture had NGTD. Patient underlying anxiety and PTSD, ? Possibly miscommunication, was eager to leave on POD#1/ on 6/3. At that time she was not interested in IV abtx, and may have been too complicated for her to self administer. She was given 2 wk course of linezolid  with plans of close follow up to doing 6 wk of abtx that would cover MSSA.  She is here today stating the abtx give her significant nausea, though she continues to take them. She reports that still is tender on superior substernal wound,some purulent drainage. Incision  left lateral breast small amount of serous fluid drop on bandaged but appeared healing incsion with sutures in the place. The subxyphoid incision still has sutures in place no drainage. No surrounding erythema  No fevers, nor nausea. But she appears slightly frantic, mild distress due to nausea.  I am concerned for superior sternal incision of chest wall has ongoing infection.  Outpatient Encounter Medications as of 09/19/2023  Medication Sig   acetaminophen  (TYLENOL ) 500 MG tablet Take 1,000 mg by mouth every 6 (six) hours as needed.   ALPRAZolam  (ALPRAZOLAM  XR) 0.5 MG 24 hr tablet TAKE 1 TABLET BY MOUTH TWICE DAILY AS NEEDED FOR ANXIETY OR SLEEP   aspirin  81 MG chewable tablet Chew 1 tablet (81 mg total) by mouth daily.   atorvastatin  (LIPITOR) 40 MG tablet Take 1 tablet (40 mg total) by mouth daily.   Biotin w/ Vitamins C & E (HAIR SKIN & NAILS GUMMIES PO) Take 1 tablet by mouth daily. Biotin   cetirizine   (ZYRTEC ) 10 MG tablet Take 1 tablet (10 mg total) by mouth daily.   clopidogrel  (PLAVIX ) 75 MG tablet Take 1 tablet (75 mg total) by mouth daily.   dapagliflozin  propanediol (FARXIGA ) 10 MG TABS tablet Take 1 tablet (10 mg total) by mouth daily before breakfast.   diphenhydrAMINE  (BENADRYL ) 25 MG tablet Take 50 mg by mouth every 6 (six) hours as needed (allergic reaction). Dye and glutin free   EPIPEN  2-PAK 0.3 MG/0.3ML SOAJ injection INJECT INTO THIGH MUSCLE THROUGH CLOTHES AS NEEDED SEVERE ALLERGIC REACTION   fluticasone  (FLONASE ) 50 MCG/ACT nasal spray 1 spray by Each Nare route daily.   furosemide  (LASIX ) 20 MG tablet Take 1 tablet (20 mg total) by mouth daily as needed.   linezolid  (ZYVOX ) 600 MG tablet Take 1 tablet (600 mg total) by mouth 2 (two) times daily.   methimazole  (TAPAZOLE ) 10 MG tablet Take 1 tablet (10 mg total) by mouth daily.   metoprolol  succinate (TOPROL  XL) 25 MG 24 hr tablet Take 1 tablet (25 mg total) by mouth daily.   montelukast  (SINGULAIR ) 10 MG tablet Take 1 tablet (10 mg total) by mouth at bedtime.   omeprazole  (PRILOSEC) 20 MG capsule Take 1 capsule (20 mg total) by mouth 2 (two) times daily.   ondansetron  (ZOFRAN -ODT) 4 MG disintegrating tablet Take 1 tablet (4 mg total) by mouth every 8 (eight) hours as needed for nausea or vomiting.   spironolactone  (ALDACTONE ) 25 MG tablet  Take 1 tablet (25 mg total) by mouth daily.   traMADol  (ULTRAM ) 50 MG tablet Take 1 tablet (50 mg total) by mouth every 8 (eight) hours.   Ubrogepant  100 MG TABS Take 1 tablet by mouth See admin instructions. Take one tablet by mouth at onset of migraine. May take an additional tablet two hours later if migraine persists.  No more than 2 tablets in 24 hours.   No facility-administered encounter medications on file as of 09/19/2023.     Patient Active Problem List   Diagnosis Date Noted   Postoperative infection 09/02/2023   Surgical site infection 09/02/2023   Tachycardia 09/02/2023    Sepsis (HCC) 09/01/2023   Cardiac defibrillator in situ 08/30/2023   Chronic systolic heart failure (HCC) 08/08/2023   Vitamin D  deficiency, unspecified 05/08/2023   Acute deep vein thrombosis (DVT) of brachial vein of left upper extremity (HCC) 01/04/2023   Cholecystitis, acute 12/12/2022   Cholelithiasis 12/11/2022   Dyslipidemia 12/11/2022   Hyperthyroidism 12/11/2022   Anxiety and depression 12/11/2022   Asthma, chronic 12/11/2022   Coronary artery disease 12/11/2022   Biliary colic 12/11/2022   Obstructive sleep apnea 10/24/2022   Calculus of gallbladder with acute on chronic cholecystitis without obstruction 10/24/2022   Insomnia 10/24/2022   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   NSTEMI (non-ST elevated myocardial infarction) (HCC) 09/01/2022   Chest pain 08/31/2022   Gastroesophageal reflux disease 12/27/2019   Multinodular goiter 08/08/2018   Vaginal cuff dehiscence 11/05/2017   S/P laparoscopic hysterectomy 09/29/2017   Chronic tension-type headache, not intractable 05/30/2017   Migraine without aura and without status migrainosus, not intractable 05/30/2017   Arthropathy of lumbar facet joint 04/08/2017   Anxiety 11/08/2013   Obesity 11/08/2013   Bipolar affective disorder (HCC) 05/17/2013   Depression 08/18/2012   Pain of upper abdomen 07/31/2012     Health Maintenance Due  Topic Date Due   HPV VACCINES (1 - 3-dose series) Never done   Pneumococcal Vaccine 76-93 Years old (2 of 2 - PCV) 11/01/2017   COVID-19 Vaccine (2 - Moderna risk series) 04/02/2020   DTaP/Tdap/Td (2 - Td or Tdap) 06/17/2020     Review of Systems 12 point ros is negative except what is mentioned above Physical Exam   BP 119/79   Pulse (!) 109   Resp 16   Ht 5' 7 (1.702 m)   Wt 248 lb (112.5 kg)   LMP 08/22/2017 Comment: says she just spots.  BMI 38.84 kg/m   Physical Exam  Constitutional:  oriented to person, place, and time. appears well-developed and well-nourished. No distress.  HENT:  Nitro/AT, PERRLA, no scleral icterus Mouth/Throat: Oropharynx is clear and moist. No oropharyngeal exudate.  Cardiovascular: Normal rate, regular rhythm and normal heart sounds. Exam reveals no gallop and no friction rub.  No murmur heard.  Chest wall = 1.2 cm opening slough to the opening, but unable to probe depth due to pain per patient reports. Some purulence in that wound bed Pulmonary/Chest: Effort normal and breath sounds normal. No respiratory distress.  has no wheezes.  Neck = supple, no nuchal rigidity Abdominal: Soft. Bowel sounds are normal.  exhibits no distension. There is no tenderness.  Lymphadenopathy: no cervical adenopathy. No axillary adenopathy Neurological: alert and oriented to person, place, and time.  Skin: Skin is warm and dry. No rash noted. No erythema.  Psychiatric: a normal mood and affect.  behavior is normal.   Lab Results  Component Value Date   LABRPR Non Reactive  01/14/2020    CBC Lab Results  Component Value Date   WBC 7.9 09/05/2023   RBC 4.70 09/05/2023   HGB 13.6 09/05/2023   HCT 42.2 09/05/2023   PLT 237 09/05/2023   MCV 89.8 09/05/2023   MCH 28.9 09/05/2023   MCHC 32.2 09/05/2023   RDW 13.2 09/05/2023   LYMPHSABS 2.6 09/05/2023   MONOABS 0.7 09/05/2023   EOSABS 0.5 09/05/2023    BMET Lab Results  Component Value Date   NA 138 09/05/2023   K 3.5 09/05/2023   CL 108 09/05/2023   CO2 18 (L) 09/05/2023   GLUCOSE 94 09/05/2023   BUN 6 09/05/2023   CREATININE 0.59 09/05/2023   CALCIUM  8.8 (L) 09/05/2023   GFRNONAA >60 09/05/2023   GFRAA >60 06/24/2019      Assessment and Plan 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 1. Will get 1000mg  load dose on Thursday then 500mg  iv weekly x 2 to complete course. She is unlikely to tolerate mri but willing to do chest CT to evaluate of chest wall to see if  deeper abscess that needs debridement or signs of osteomyelitis.  Nausea= gave dose of zofran  in the office  Long term meds = will check cbc, bmp, sed rate and crp whille on linezolid .  Sutures-incision to left side chest wall and xyphoid incision with retained sutures = to see cardiology tomorrow to evaluation and removal of sutures.  Will reach out to [facility] to see if can consider admitting patient for faster work up and management. Concern may need repeat debridement. Will continue with linezolid  has afew  more days. Then switch to dalbavancin.  I have personally spent 50 minutes involved in face-to-face and non-face-to-face activities for this patient on the day of the visit. Professional time spent includes the following activities: Preparing to see the patient (review of tests), Obtaining and/or reviewing separately obtained history (admission/discharge record), Performing a medically appropriate examination and/or evaluation , Ordering medications/tests/procedures, referring and communicating with other health care professionals, Documenting clinical information in the EMR, Independently interpreting results (not separately reported), Communicating results to the patient, Counseling and educating the patient and significant other

## 2023-09-19 NOTE — Patient Instructions (Signed)
 Smoking Cessation: QuitlineNC 1-800-QUIT-NOW [phone]); Espaol: 1-855-Djelo-Ya (1-780-445-4976) [email or web address].[email or web address]

## 2023-09-19 NOTE — Telephone Encounter (Signed)
 Patient is scheduled per Dr Levern Reader for [facility] on 09/22/23 10 am. They will schedule the weekly visits x 3 weeks after dose 1 per [facility].  Contact Makayla Dariel Edelson pr Alcario Alvine at [facility]  via secure chat for any concerns.  Provider and patient aware of appointment. She has transportation set up and when she is on dose 3 will check in with Dr Levern Reader to see if she needs to schedule visit at [facility].

## 2023-09-20 ENCOUNTER — Ambulatory Visit: Payer: MEDICAID

## 2023-09-20 ENCOUNTER — Ambulatory Visit: Payer: MEDICAID | Attending: Cardiology

## 2023-09-20 DIAGNOSIS — I5022 Chronic systolic (congestive) heart failure: Secondary | ICD-10-CM

## 2023-09-20 LAB — CBC WITH DIFFERENTIAL/PLATELET
Absolute Lymphocytes: 2879 {cells}/uL (ref 850–3900)
Absolute Monocytes: 697 {cells}/uL (ref 200–950)
Basophils Absolute: 141 {cells}/uL (ref 0–200)
Basophils Relative: 1.4 %
Eosinophils Absolute: 283 {cells}/uL (ref 15–500)
Eosinophils Relative: 2.8 %
HCT: 48.1 % — ABNORMAL HIGH (ref 35.0–45.0)
Hemoglobin: 15.2 g/dL (ref 11.7–15.5)
MCH: 29.3 pg (ref 27.0–33.0)
MCHC: 31.6 g/dL — ABNORMAL LOW (ref 32.0–36.0)
MCV: 92.7 fL (ref 80.0–100.0)
MPV: 10 fL (ref 7.5–12.5)
Monocytes Relative: 6.9 %
Neutro Abs: 6100 {cells}/uL (ref 1500–7800)
Neutrophils Relative %: 60.4 %
Platelets: 294 10*3/uL (ref 140–400)
RBC: 5.19 10*6/uL — ABNORMAL HIGH (ref 3.80–5.10)
RDW: 13 % (ref 11.0–15.0)
Total Lymphocyte: 28.5 %
WBC: 10.1 10*3/uL (ref 3.8–10.8)

## 2023-09-20 LAB — BASIC METABOLIC PANEL WITH GFR
BUN: 14 mg/dL (ref 7–25)
CO2: 19 mmol/L — ABNORMAL LOW (ref 20–32)
Calcium: 9.2 mg/dL (ref 8.6–10.2)
Chloride: 108 mmol/L (ref 98–110)
Creat: 0.75 mg/dL (ref 0.50–0.99)
Glucose, Bld: 84 mg/dL (ref 65–99)
Potassium: 4.2 mmol/L (ref 3.5–5.3)
Sodium: 139 mmol/L (ref 135–146)
eGFR: 103 mL/min/{1.73_m2} (ref >=60)

## 2023-09-20 LAB — SEDIMENTATION RATE: Sed Rate: 2 mm/h (ref 0–20)

## 2023-09-20 LAB — C-REACTIVE PROTEIN: CRP: <3 mg/L (ref <8.0)

## 2023-09-20 NOTE — Patient Instructions (Signed)
   After Your Device Clinic Apt.    Monitor your defibrillator site for redness, swelling, and drainage. Call the device clinic at [phone] if you experience these symptoms or fever/chills.

## 2023-09-20 NOTE — Progress Notes (Signed)
 Patient seen in device clinic for wound recheck. Assessed by Dr. Daneil Dunker.   Per Dr. Daneil Dunker below:  S-ICD removal- sutures are to stay in place and not removed at this visit today. Sterile 4x4 placed over incision/sutures d/t occasional drainage/patient comfort.   Loop Explant site- Round opening present that is to remain open and not be packed with gauze. Very minimal drainage noted. 1 suture removed by this writer at lower end of round opening. Sterile 4x4 placed over site due to drainage/pt comfort.  Incision below sternum- Sutures to remain in place and not be removed today. Sterile 4x4 placed over site for patient comfort.   Patient provided wound care instructions by Dr. Daneil Dunker. Pt to return 10/04/23 @ 12:00 when Dr. Daneil Dunker is in office and can review.

## 2023-09-21 ENCOUNTER — Ambulatory Visit: Payer: MEDICAID

## 2023-09-22 ENCOUNTER — Ambulatory Visit
Admission: RE | Admit: 2023-09-22 | Discharge: 2023-09-22 | Disposition: A | Discharge status: Home / Self Care | Payer: MEDICAID | Source: Ambulatory Visit | Attending: Internal Medicine | Admitting: Internal Medicine

## 2023-09-22 ENCOUNTER — Telehealth: Payer: Self-pay

## 2023-09-22 ENCOUNTER — Other Ambulatory Visit: Payer: Self-pay | Admitting: Internal Medicine

## 2023-09-22 MED ORDER — LINEZOLID 600 MG PO TABS: 600.0000 mg | ORAL_TABLET | Freq: Two times a day (BID) | ORAL | 0 refills | Status: DC

## 2023-09-22 NOTE — Telephone Encounter (Signed)
 Per Dr Levern Reader: I called and advised patient to pick up oral Linzolid since she could not keep infusion appointment at [facility]. She will keep the appt next week with infusion and take oral abx unti then.

## 2023-09-24 ENCOUNTER — Encounter: Payer: Self-pay | Admitting: Family

## 2023-09-24 NOTE — Progress Notes (Signed)
 Established Patient Office Visit  Subjective:  Patient ID: Elizabeth Spencer, female    DOB: [birth year]  Age: 38 y.o. MRN: [id number]  Chief Complaint  Patient presents with   Follow-up    3 month follow up    Patient is here today for her 3 months follow up.  She has been feeling fairly well since last appointment.   She does have additional concerns to discuss today.  She asks if we can repeat UDS for her, as she had one in the hospital come back positive that she thinks was a false positive.    Labs are due today. She needs refills.   I have reviewed her active problem list, medication list, allergies, notes from last encounter, lab results, for her appointment today.      No other concerns at this time.   Past Medical History:  Diagnosis Date   Aneurysm (HCC) 01/04/2014   Arthritis    Asthma    WELL CONTROLLED   Brain aneurysm 2007   NEUROLOGY NOTE DOES NOT MENTION ANEURYSM BUT PT STATES SHE DID NOT HAVE TO HAVE SURGERY   Complication of anesthesia    FOR 1 C-SECTION PT WAS ITCHING AND VERY RED ON HER FACE   Cough, persistent 01/27/2016   Dermatitis due to sunburn 11/08/2013   Family history of adverse reaction to anesthesia    brother, neice and nephew got red in face with hives   Gallstones    GERD (gastroesophageal reflux disease)    Gonorrhea 06/20/2020   Headache    CHRONIC HEADACHES   History of chronic cough    DRY   Loss of memory 05/30/2017   Perforation of left tympanic membrane 05/17/2013   Pneumonia    PTSD (post-traumatic stress disorder)    PTSD (post-traumatic stress disorder)    Thyroid  condition    PT WAS JUST TOLD ON 08-25-17 THAT SHE HAS A THYROID  PROBLEM AND IS GOING TO F/U WITH ENDOCRINOLOGIST IN 2 WEEKS   Trichomonas vaginalis (TV) infection 06/20/2020    Past Surgical History:  Procedure Laterality Date   ABDOMINAL HYSTERECTOMY     CESAREAN SECTION     x3   CORONARY ARTERY BYPASS GRAFT N/A 09/02/2022   Procedure: CORONARY ARTERY  BYPASS GRAFTING (CABG) X 1 USING LEFT INTERNAL MAMMARY ARTERY;  Surgeon: Lucas Dorise POUR, MD;  Location: [facility] OR;  Service: Open Heart Surgery;  Laterality: N/A;   CYSTOSCOPY N/A 09/29/2017   Procedure: CYSTOSCOPY;  Surgeon: Lake Read, MD;  Location: [facility] ORS;  Service: Gynecology;  Laterality: N/A;   CYSTOSCOPY N/A 11/05/2017   Procedure: CYSTOSCOPY;  Surgeon: Lake Read, MD;  Location: [facility] ORS;  Service: Gynecology;  Laterality: N/A;   HYSTEROSCOPY WITH D & C N/A 09/01/2017   Procedure: DILATATION AND CURETTAGE /HYSTEROSCOPY;  Surgeon: Lake Read, MD;  Location: [facility] ORS;  Service: Gynecology;  Laterality: N/A;   LAPAROSCOPY N/A 09/01/2017   Procedure: LAPAROSCOPY OPERATIVE with biopsy;  Surgeon: Lake Read, MD;  Location: [facility] ORS;  Service: Gynecology;  Laterality: N/A;   LAPAROSCOPY N/A 11/05/2017   Procedure: LAPAROSCOPY DIAGNOSTIC;  Surgeon: Lake Read, MD;  Location: [facility] ORS;  Service: Gynecology;  Laterality: N/A;   LEFT HEART CATH AND CORONARY ANGIOGRAPHY N/A 09/01/2022   Procedure: LEFT HEART CATH AND CORONARY ANGIOGRAPHY;  Surgeon: Mady Bruckner, MD;  Location: [facility] INVASIVE CV LAB;  Service: Cardiovascular;  Laterality: N/A;   REPAIR VAGINAL CUFF N/A 11/05/2017   Procedure: REPAIR VAGINAL CUFF;  Surgeon: Lake Read, MD;  Location: [facility] ORS;  Service: Gynecology;  Laterality: N/A;   SUBQ ICD IMPLANT N/A 08/08/2023   Procedure: SUBQ ICD IMPLANT;  Surgeon: Kennyth Chew, MD;  Location: [facility] INVASIVE CV LAB;  Service: Cardiovascular;  Laterality: N/A;   SUBQ ICD REVISION N/A 08/09/2023   Procedure: SUBQ ICD REVISION;  Surgeon: Kennyth Chew, MD;  Location: [facility] INVASIVE CV LAB;  Service: Cardiovascular;  Laterality: N/A;   SUBQ ICD REVISION N/A 09/05/2023   Procedure: SUBQ ICD REVISION;  Surgeon: Kennyth Chew, MD;  Location: [facility] INVASIVE CV LAB;  Service: Cardiovascular;  Laterality: N/A;   TEE WITHOUT CARDIOVERSION N/A 09/02/2022   Procedure:  TRANSESOPHAGEAL ECHOCARDIOGRAM;  Surgeon: Lucas Dorise POUR, MD;  Location: [facility] OR;  Service: Open Heart Surgery;  Laterality: N/A;   TOTAL LAPAROSCOPIC HYSTERECTOMY WITH SALPINGECTOMY Bilateral 09/29/2017   Procedure: TOTAL LAPAROSCOPIC HYSTERECTOMY WITH SALPINGECTOMY;  Surgeon: Lake Read, MD;  Location: [facility] ORS;  Service: Gynecology;  Laterality: Bilateral;    Social History   Socioeconomic History   Marital status: Single    Spouse name: Not on file   Number of children: Not on file   Years of education: Not on file   Highest education level: Not on file  Occupational History   Not on file  Tobacco Use   Smoking status: Every Day    Current packs/day: 0.50    Average packs/day: 0.5 packs/day for 18.0 years (9.0 ttl pk-yrs)    Types: Cigarettes    Passive exposure: Past   Smokeless tobacco: Never  Vaping Use   Vaping status: Never Used  Substance and Sexual Activity   Alcohol use: Yes    Alcohol/week: 0.0 standard drinks of alcohol    Comment: occasionally   Drug use: Not Currently    Types: Marijuana, Cocaine   Sexual activity: Yes    Partners: Male    Birth control/protection: Surgical    Comment: Hysterectomy  Other Topics Concern   Not on file  Social History Narrative   Not on file   Social Drivers of Health   Financial Resource Strain: Not on file  Food Insecurity: No Food Insecurity (09/02/2023)   Hunger Vital Sign    Worried About Running Out of Food in the Last Year: Never true    Ran Out of Food in the Last Year: Never true  Transportation Needs: No Transportation Needs (09/02/2023)   PRAPARE - Administrator, Civil Service (Medical): No    Lack of Transportation (Non-Medical): No  Physical Activity: Not on file  Stress: Not on file  Social Connections: Unknown (08/08/2023)   Social Connection and Isolation Panel    Frequency of Communication with Friends and Family: More than three times a week    Frequency of Social Gatherings with  Friends and Family: More than three times a week    Attends Religious Services: Not on Marketing executive or Organizations: Not on file    Attends Banker Meetings: Not on file    Marital Status: Living with partner  Intimate Partner Violence: Not At Risk (09/02/2023)   Humiliation, Afraid, Rape, and Kick questionnaire    Fear of Current or Ex-Partner: No    Emotionally Abused: No    Physically Abused: No    Sexually Abused: No    Family History  Problem Relation Age of Onset   Hypertension Mother    Alcohol abuse Mother    Deep vein thrombosis Mother    Alcohol abuse Father  COPD Father    Emphysema Father    Heart disease Father     Allergies  Allergen Reactions   Azithromycin Swelling and Other (See Comments)   Clindamycin  Swelling   Codeine Hives and Other (See Comments)    Nausea/dizzy    Meloxicam  Other (See Comments)    Burns up from the inside    Neurontin [Gabapentin] Other (See Comments)    Makes her pass out and not remember what happened prior to taking med.   Penicillins Anaphylaxis    Patient allergic to all cillins Has patient had a PCN reaction causing immediate rash, facial/tongue/throat swelling, SOB or lightheadedness with hypotension: Yes Has patient had a PCN reaction causing severe rash involving mucus membranes or skin necrosis: Yes Has patient had a PCN reaction that required hospitalization: No Has patient had a PCN reaction occurring within the last 10 years: Yes If all of the above answers are NO, then may proceed with Cephalosporin use.    Clindamycin /Lincomycin Swelling   Other Hives and Other (See Comments)    Berry flavored food and drinks   Diphenhydramine  Hcl Rash and Other (See Comments)    Uses dye-free; allergic to dye in regular benadryl    Doxycycline Rash   Egg-Derived Products Rash    Review of Systems  All other systems reviewed and are negative.      Objective:   BP 94/62   Pulse 91    Ht 5' 7 (1.702 m)   Wt 248 lb 9.6 oz (112.8 kg)   LMP 08/22/2017 Comment: says she just spots.  SpO2 99%   BMI 38.94 kg/m   Vitals:   06/03/23 1342  BP: 94/62  Pulse: 91  Height: 5' 7 (1.702 m)  Weight: 248 lb 9.6 oz (112.8 kg)  SpO2: 99%  BMI (Calculated): 38.93    Physical Exam Vitals and nursing note reviewed.  Constitutional:      Appearance: Normal appearance. She is normal weight.  HENT:     Head: Normocephalic.   Eyes:     Extraocular Movements: Extraocular movements intact.     Conjunctiva/sclera: Conjunctivae normal.     Pupils: Pupils are equal, round, and reactive to light.    Cardiovascular:     Rate and Rhythm: Normal rate.  Pulmonary:     Effort: Pulmonary effort is normal.   Neurological:     General: No focal deficit present.     Mental Status: She is alert and oriented to person, place, and time. Mental status is at baseline.   Psychiatric:        Mood and Affect: Mood normal.        Behavior: Behavior normal.        Thought Content: Thought content normal.        Judgment: Judgment normal.      Results for orders placed or performed in visit on 06/03/23  CBC with Diff  Result Value Ref Range   WBC 9.6 3.4 - 10.8 x10E3/uL   RBC 5.32 (H) 3.77 - 5.28 x10E6/uL   Hemoglobin 15.9 11.1 - 15.9 g/dL   Hematocrit 51.7 (H) 65.9 - 46.6 %   MCV 91 79 - 97 fL   MCH 29.9 26.6 - 33.0 pg   MCHC 33.0 31.5 - 35.7 g/dL   RDW 87.9 88.2 - 84.5 %   Platelets 300 150 - 450 x10E3/uL   Neutrophils 56 Not Estab. %   Lymphs 33 Not Estab. %   Monocytes 7 Not Estab. %  Eos 3 Not Estab. %   Basos 1 Not Estab. %   Neutrophils Absolute 5.3 1.4 - 7.0 x10E3/uL   Lymphocytes Absolute 3.2 (H) 0.7 - 3.1 x10E3/uL   Monocytes Absolute 0.7 0.1 - 0.9 x10E3/uL   EOS (ABSOLUTE) 0.3 0.0 - 0.4 x10E3/uL   Basophils Absolute 0.1 0.0 - 0.2 x10E3/uL   Immature Granulocytes 0 Not Estab. %   Immature Grans (Abs) 0.0 0.0 - 0.1 x10E3/uL  Reticulocytes Count  Result Value Ref  Range   Retic Ct Pct 1.3 0.6 - 2.6 %  Drug Screen 10 W/Conf, Se  Result Value Ref Range   Amphetamines, IA Negative Cutoff:50 ng/mL   Barbiturates, IA Negative Cutoff:0.1 ug/mL   Benzodiazepines, IA Negative Cutoff:20 ng/mL   Cocaine & Metabolite, IA Negative Cutoff:25 ng/mL   Phencyclidine, IA Negative Cutoff:8 ng/mL   THC(Marijuana) Metabolite, IA Negative Cutoff:5 ng/mL   Opiates, IA Negative Cutoff:5 ng/mL   Oxycodones, IA Negative Cutoff:5 ng/mL   Methadone, IA Negative Cutoff:25 ng/mL   Propoxyphene, IA Negative Cutoff:50 ng/mL  POCT Urinalysis Dipstick (81002)  Result Value Ref Range   Color, UA yellow    Clarity, UA clear    Glucose, UA Positive (A) Negative   Bilirubin, UA negative    Ketones, UA negative    Spec Grav, UA 1.020 1.010 - 1.025   Blood, UA negative    pH, UA 6.0 5.0 - 8.0   Protein, UA Negative Negative   Urobilinogen, UA 0.2 0.2 or 1.0 E.U./dL   Nitrite, UA negative    Leukocytes, UA Negative Negative   Appearance clear    Odor no   POC Influenza A&B (Binax test)  Result Value Ref Range   Influenza A, POC Negative Negative   Influenza B, POC Negative Negative    Recent Results (from the past 2160 hours)  CBC with Diff     Status: Abnormal   Collection Time: 07/25/23  2:38 PM  Result Value Ref Range   WBC 7.5 3.4 - 10.8 x10E3/uL   RBC 5.42 (H) 3.77 - 5.28 x10E6/uL   Hemoglobin 15.5 11.1 - 15.9 g/dL   Hematocrit 51.4 (H) 65.9 - 46.6 %   MCV 90 79 - 97 fL   MCH 28.6 26.6 - 33.0 pg   MCHC 32.0 31.5 - 35.7 g/dL   RDW 87.3 88.2 - 84.5 %   Platelets 242 150 - 450 x10E3/uL   Neutrophils 53 Not Estab. %   Lymphs 36 Not Estab. %   Monocytes 8 Not Estab. %   Eos 2 Not Estab. %   Basos 1 Not Estab. %   Neutrophils Absolute 4.0 1.4 - 7.0 x10E3/uL   Lymphocytes Absolute 2.7 0.7 - 3.1 x10E3/uL   Monocytes Absolute 0.6 0.1 - 0.9 x10E3/uL   EOS (ABSOLUTE) 0.2 0.0 - 0.4 x10E3/uL   Basophils Absolute 0.1 0.0 - 0.2 x10E3/uL   Immature Granulocytes 0 Not  Estab. %   Immature Grans (Abs) 0.0 0.0 - 0.1 x10E3/uL  TSH+T4F+T3Free     Status: Abnormal   Collection Time: 07/25/23  2:38 PM  Result Value Ref Range   TSH <0.005 (L) 0.450 - 4.500 uIU/mL   T3, Free 4.4 2.0 - 4.4 pg/mL   Free T4 1.76 0.82 - 1.77 ng/dL  Lipid panel     Status: None   Collection Time: 07/25/23  2:38 PM  Result Value Ref Range   Cholesterol, Total 134 100 - 199 mg/dL   Triglycerides 887 0 - 149 mg/dL  HDL 40 >39 mg/dL   VLDL Cholesterol Cal 21 5 - 40 mg/dL   LDL Chol Calc (NIH) 73 0 - 99 mg/dL   Chol/HDL Ratio 3.4 0.0 - 4.4 ratio    Comment:                                   T. Chol/HDL Ratio                                             Men  Women                               1/2 Avg.Risk  3.4    3.3                                   Avg.Risk  5.0    4.4                                2X Avg.Risk  9.6    7.1                                3X Avg.Risk 23.4   11.0   VITAMIN D  25 Hydroxy (Vit-D Deficiency, Fractures)     Status: Abnormal   Collection Time: 07/25/23  2:38 PM  Result Value Ref Range   Vit D, 25-Hydroxy 15.3 (L) 30.0 - 100.0 ng/mL    Comment: Vitamin D  deficiency has been defined by the Institute of Medicine and an Endocrine Society practice guideline as a level of serum 25-OH vitamin D  less than 20 ng/mL (1,2). The Endocrine Society went on to further define vitamin D  insufficiency as a level between 21 and 29 ng/mL (2). 1. IOM (Institute of Medicine). 2010. Dietary reference    intakes for calcium  and D. [place]: The    [employer]. 2. Holick MF, Binkley B and E, Bischoff-Ferrari HA, et al.    Evaluation, treatment, and prevention of vitamin D     deficiency: an Endocrine Society clinical practice    guideline. JCEM. 2011 Jul; 96(7):1911-30.   CMP14+EGFR     Status: Abnormal   Collection Time: 07/25/23  2:38 PM  Result Value Ref Range   Glucose 85 70 - 99 mg/dL   BUN 8 6 - 24 mg/dL   Creatinine, Ser 9.24 0.57 - 1.00 mg/dL    eGFR 896 >40 fO/fpw/8.26   BUN/Creatinine Ratio 11 9 - 23   Sodium 141 134 - 144 mmol/L   Potassium 3.8 3.5 - 5.2 mmol/L   Chloride 108 (H) 96 - 106 mmol/L   CO2 16 (L) 20 - 29 mmol/L   Calcium  9.6 8.7 - 10.2 mg/dL   Total Protein 6.5 6.0 - 8.5 g/dL   Albumin  4.2 3.9 - 4.9 g/dL   Globulin, Total 2.3 1.5 - 4.5 g/dL   Bilirubin Total 0.2 0.0 - 1.2 mg/dL   Alkaline Phosphatase 93 44 - 121 IU/L   AST 20 0 - 40 IU/L   ALT 21 0 - 32 IU/L  Hemoglobin A1c  Status: None   Collection Time: 07/25/23  2:38 PM  Result Value Ref Range   Hgb A1c MFr Bld 5.4 4.8 - 5.6 %    Comment:          Prediabetes: 5.7 - 6.4          Diabetes: >6.4          Glycemic control for adults with diabetes: <7.0    Est. average glucose Bld gHb Est-mCnc 108 mg/dL  Vitamin B12     Status: None   Collection Time: 07/25/23  2:38 PM  Result Value Ref Range   Vitamin B-12 435 232 - 1,245 pg/mL  Blood Culture (routine x 2)     Status: None   Collection Time: 09/01/23  8:29 PM   Specimen: BLOOD RIGHT HAND  Result Value Ref Range   Specimen Description BLOOD RIGHT HAND    Special Requests      BOTTLES DRAWN AEROBIC AND ANAEROBIC Blood Culture adequate volume   Culture      NO GROWTH 5 DAYS Performed at [facility], [address] [place], [place] [address]    Report Status 09/06/2023 FINAL   Blood Culture (routine x 2)     Status: None   Collection Time: 09/01/23  8:34 PM   Specimen: BLOOD  Result Value Ref Range   Specimen Description BLOOD RIGHT ANTECUBITAL    Special Requests      BOTTLES DRAWN AEROBIC AND ANAEROBIC Blood Culture adequate volume   Culture      NO GROWTH 5 DAYS Performed at [facility], [address], [place], [place] [address]    Report Status 09/06/2023 FINAL   CBC with Differential     Status: Abnormal   Collection Time: 09/01/23  8:50 PM  Result Value Ref Range   WBC 25.3 (H) 4.0 - 10.5 K/uL   RBC 5.19 (H) 3.87 - 5.11 MIL/uL   Hemoglobin 15.4 (H) 12.0 - 15.0 g/dL    HCT 53.6 (H) 63.9 - 46.0 %   MCV 89.2 80.0 - 100.0 fL   MCH 29.7 26.0 - 34.0 pg   MCHC 33.3 30.0 - 36.0 g/dL   RDW 86.7 88.4 - 84.4 %   Platelets 217 150 - 400 K/uL   nRBC 0.0 0.0 - 0.2 %   Neutrophils Relative % 86 %   Neutro Abs 21.7 (H) 1.7 - 7.7 K/uL   Lymphocytes Relative 7 %   Lymphs Abs 1.7 0.7 - 4.0 K/uL   Monocytes Relative 6 %   Monocytes Absolute 1.6 (H) 0.1 - 1.0 K/uL   Eosinophils Relative 0 %   Eosinophils Absolute 0.0 0.0 - 0.5 K/uL   Basophils Relative 0 %   Basophils Absolute 0.1 0.0 - 0.1 K/uL   WBC Morphology MORPHOLOGY UNREMARKABLE    RBC Morphology MORPHOLOGY UNREMARKABLE    Smear Review Normal platelet morphology    Immature Granulocytes 1 %   Abs Immature Granulocytes 0.27 (H) 0.00 - 0.07 K/uL    Comment: Performed at [facility], [address], [place], [place] [address]  Comprehensive metabolic panel     Status: Abnormal   Collection Time: 09/01/23  8:50 PM  Result Value Ref Range   Sodium 131 (L) 135 - 145 mmol/L   Potassium 3.6 3.5 - 5.1 mmol/L   Chloride 101 98 - 111 mmol/L   CO2 18 (L) 22 - 32 mmol/L   Glucose, Bld 111 (H) 70 - 99 mg/dL  Comment: Glucose reference range applies only to samples taken after fasting for at least 8 hours.   BUN 8 6 - 20 mg/dL   Creatinine, Ser 9.09 0.44 - 1.00 mg/dL   Calcium  8.9 8.9 - 10.3 mg/dL   Total Protein 7.0 6.5 - 8.1 g/dL   Albumin  3.7 3.5 - 5.0 g/dL   AST 26 15 - 41 U/L   ALT 32 0 - 44 U/L   Alkaline Phosphatase 73 38 - 126 U/L   Total Bilirubin 1.4 (H) 0.0 - 1.2 mg/dL   GFR, Estimated >39 >39 mL/min    Comment: (NOTE) Calculated using the CKD-EPI Creatinine Equation (2021)    Anion gap 12 5 - 15    Comment: Performed at [facility], [address] [place], [place] [address]  Troponin I (High Sensitivity)     Status: None   Collection Time: 09/01/23  8:50 PM  Result Value Ref Range   Troponin I (High Sensitivity) 5 <18 ng/L    Comment: (NOTE) Elevated high sensitivity troponin  I (hsTnI) values and significant  changes across serial measurements may suggest ACS but many other  chronic and acute conditions are known to elevate hsTnI results.  Refer to the Links section for chest pain algorithms and additional  guidance. Performed at [facility], [address] [place], [place] [address]   hCG, serum, qualitative     Status: None   Collection Time: 09/01/23  8:50 PM  Result Value Ref Range   Preg, Serum NEGATIVE NEGATIVE    Comment:        THE SENSITIVITY OF THIS METHODOLOGY IS >10 mIU/mL. Performed at [facility], [address], [place], [place] [address]   I-Stat CG4 Lactic Acid     Status: None   Collection Time: 09/01/23  8:57 PM  Result Value Ref Range   Lactic Acid, Venous 1.5 0.5 - 1.9 mmol/L  Troponin I (High Sensitivity)     Status: None   Collection Time: 09/01/23 10:45 PM  Result Value Ref Range   Troponin I (High Sensitivity) 5 <18 ng/L    Comment: (NOTE) Elevated high sensitivity troponin I (hsTnI) values and significant  changes across serial measurements may suggest ACS but many other  chronic and acute conditions are known to elevate hsTnI results.  Refer to the Links section for chest pain algorithms and additional  guidance. Performed at [facility] Lab, [address], [place], [place] [address]   I-Stat CG4 Lactic Acid     Status: None   Collection Time: 09/01/23 10:50 PM  Result Value Ref Range   Lactic Acid, Venous 0.8 0.5 - 1.9 mmol/L  HIV Antibody (routine testing w rflx)     Status: None   Collection Time: 09/02/23  7:00 AM  Result Value Ref Range   HIV Screen 4th Generation wRfx Non Reactive Non Reactive    Comment: Performed at [facility], [address] [place], [place] [address]  Urinalysis, w/ Reflex to Culture (Infection Suspected) -Urine, Clean Catch     Status: Abnormal   Collection Time: 09/02/23  7:16 AM  Result Value Ref Range   Specimen Source URINE, CLEAN CATCH    Color, Urine  YELLOW YELLOW   APPearance CLEAR CLEAR   Specific Gravity, Urine 1.038 (H) 1.005 - 1.030   pH 5.0 5.0 - 8.0   Glucose, UA >=500 (A) NEGATIVE mg/dL   Hgb urine dipstick SMALL (A) NEGATIVE  Bilirubin Urine NEGATIVE NEGATIVE   Ketones, ur 5 (A) NEGATIVE mg/dL   Protein, ur NEGATIVE NEGATIVE mg/dL   Nitrite NEGATIVE NEGATIVE   Leukocytes,Ua NEGATIVE NEGATIVE   RBC / HPF 0-5 0 - 5 RBC/hpf   WBC, UA 0-5 0 - 5 WBC/hpf    Comment:        Reflex urine culture not performed if WBC <=10, OR if Squamous epithelial cells >5. If Squamous epithelial cells >5 suggest recollection.    Bacteria, UA RARE (A) NONE SEEN   Squamous Epithelial / HPF 0-5 0 - 5 /HPF    Comment: Performed at [facility], [address], [place], [place] [address]  MRSA Next Gen by PCR, Nasal     Status: None   Collection Time: 09/02/23  7:28 AM   Specimen: Nasal Mucosa; Nasal Swab  Result Value Ref Range   MRSA by PCR Next Gen NOT DETECTED NOT DETECTED    Comment: (NOTE) The GeneXpert MRSA Assay (FDA approved for NASAL specimens only), is one component of a comprehensive MRSA colonization surveillance program. It is not intended to diagnose MRSA infection nor to guide or monitor treatment for MRSA infections. Test performance is not FDA approved in patients less than 63 years old. Performed at [facility], [address], [place], [place] [address]   Basic metabolic panel with GFR     Status: Abnormal   Collection Time: 09/02/23 10:37 AM  Result Value Ref Range   Sodium 133 (L) 135 - 145 mmol/L   Potassium 3.4 (L) 3.5 - 5.1 mmol/L   Chloride 106 98 - 111 mmol/L   CO2 16 (L) 22 - 32 mmol/L   Glucose, Bld 94 70 - 99 mg/dL    Comment: Glucose reference range applies only to samples taken after fasting for at least 8 hours.   BUN 6 6 - 20 mg/dL   Creatinine, Ser 9.39 0.44 - 1.00 mg/dL   Calcium  8.6 (L) 8.9 - 10.3 mg/dL   GFR, Estimated >39 >39 mL/min    Comment: (NOTE) Calculated using the CKD-EPI  Creatinine Equation (2021)    Anion gap 11 5 - 15    Comment: Performed at [facility], [address] [place], [place] [address]  T4, free     Status: None   Collection Time: 09/02/23 10:37 AM  Result Value Ref Range   Free T4 1.12 0.61 - 1.12 ng/dL    Comment: (NOTE) Biotin ingestion may interfere with free T4 tests. If the results are inconsistent with the TSH level, previous test results, or the clinical presentation, then consider biotin interference. If needed, order repeat testing after stopping biotin. Performed at [facility], [address], [place], [place] [address]   TSH     Status: Abnormal   Collection Time: 09/02/23 10:37 AM  Result Value Ref Range   TSH 0.022 (L) 0.350 - 4.500 uIU/mL    Comment: Performed by a 3rd Generation assay with a functional sensitivity of <=0.01 uIU/mL. Performed at [facility], [address] [place], [place] [address]   CBC with Differential/Platelet     Status: Abnormal   Collection Time: 09/02/23 10:37 AM  Result Value Ref Range   WBC 17.9 (H) 4.0 - 10.5 K/uL   RBC 4.51 3.87 - 5.11 MIL/uL   Hemoglobin 13.4 12.0 - 15.0 g/dL   HCT 59.7 63.9 - 53.9 %   MCV 89.1 80.0 - 100.0 fL   MCH  29.7 26.0 - 34.0 pg   MCHC 33.3 30.0 - 36.0 g/dL   RDW 86.5 88.4 - 84.4 %   Platelets 172 150 - 400 K/uL   nRBC 0.0 0.0 - 0.2 %   Neutrophils Relative % 78 %   Neutro Abs 13.9 (H) 1.7 - 7.7 K/uL   Lymphocytes Relative 11 %   Lymphs Abs 2.0 0.7 - 4.0 K/uL   Monocytes Relative 7 %   Monocytes Absolute 1.3 (H) 0.1 - 1.0 K/uL   Eosinophils Relative 3 %   Eosinophils Absolute 0.5 0.0 - 0.5 K/uL   Basophils Relative 0 %   Basophils Absolute 0.1 0.0 - 0.1 K/uL   Immature Granulocytes 1 %   Abs Immature Granulocytes 0.12 (H) 0.00 - 0.07 K/uL    Comment: Performed at [facility], [address] [place], [place] [address]  Sedimentation rate     Status: None   Collection Time: 09/02/23 10:37 AM  Result Value Ref Range   Sed  Rate 18 0 - 22 mm/hr    Comment: Performed at [facility], [address] [place], [place] [address]  C-reactive protein     Status: Abnormal   Collection Time: 09/02/23 10:37 AM  Result Value Ref Range   CRP 23.6 (H) <1.0 mg/dL    Comment: Performed at [facility] Lab, [address], [place], [place] [address]  Magnesium      Status: None   Collection Time: 09/02/23 10:37 AM  Result Value Ref Range   Magnesium  1.8 1.7 - 2.4 mg/dL    Comment: Performed at [facility], [address], [place], [place] [address]  CBC with Differential/Platelet     Status: None   Collection Time: 09/03/23  2:53 PM  Result Value Ref Range   WBC 9.6 4.0 - 10.5 K/uL   RBC 4.54 3.87 - 5.11 MIL/uL   Hemoglobin 13.5 12.0 - 15.0 g/dL   HCT 59.1 63.9 - 53.9 %   MCV 89.9 80.0 - 100.0 fL   MCH 29.7 26.0 - 34.0 pg   MCHC 33.1 30.0 - 36.0 g/dL   RDW 86.5 88.4 - 84.4 %   Platelets 185 150 - 400 K/uL   nRBC 0.0 0.0 - 0.2 %   Neutrophils Relative % 65 %   Neutro Abs 6.2 1.7 - 7.7 K/uL   Lymphocytes Relative 19 %   Lymphs Abs 1.8 0.7 - 4.0 K/uL   Monocytes Relative 9 %   Monocytes Absolute 0.9 0.1 - 1.0 K/uL   Eosinophils Relative 6 %   Eosinophils Absolute 0.5 0.0 - 0.5 K/uL   Basophils Relative 1 %   Basophils Absolute 0.1 0.0 - 0.1 K/uL   Immature Granulocytes 0 %   Abs Immature Granulocytes 0.04 0.00 - 0.07 K/uL    Comment: Performed at [facility], [address] [place], [place] [address]  Comprehensive metabolic panel with GFR     Status: Abnormal   Collection Time: 09/03/23  2:53 PM  Result Value Ref Range   Sodium 137 135 - 145 mmol/L   Potassium 3.8 3.5 - 5.1 mmol/L   Chloride 109 98 - 111 mmol/L   CO2 20 (L) 22 - 32 mmol/L   Glucose, Bld 100 (H) 70 - 99 mg/dL    Comment: Glucose reference range applies only to samples taken after fasting for at least 8 hours.   BUN 7 6 - 20 mg/dL   Creatinine, Ser 9.28 0.44 -  1.00 mg/dL   Calcium  8.8 (L) 8.9 - 10.3 mg/dL   Total  Protein 6.3 (L) 6.5 - 8.1 g/dL   Albumin  2.9 (L) 3.5 - 5.0 g/dL   AST 15 15 - 41 U/L   ALT 18 0 - 44 U/L   Alkaline Phosphatase 69 38 - 126 U/L   Total Bilirubin 0.5 0.0 - 1.2 mg/dL   GFR, Estimated >39 >39 mL/min    Comment: (NOTE) Calculated using the CKD-EPI Creatinine Equation (2021)    Anion gap 8 5 - 15    Comment: Performed at [facility], [address], [place], [place] [address]  Magnesium      Status: None   Collection Time: 09/03/23  2:53 PM  Result Value Ref Range   Magnesium  1.8 1.7 - 2.4 mg/dL    Comment: Performed at [facility], [address], [place], [place] [address]  CBC with Differential/Platelet     Status: Abnormal   Collection Time: 09/04/23  3:36 AM  Result Value Ref Range   WBC 8.6 4.0 - 10.5 K/uL   RBC 4.35 3.87 - 5.11 MIL/uL   Hemoglobin 12.9 12.0 - 15.0 g/dL   HCT 60.9 63.9 - 53.9 %   MCV 89.7 80.0 - 100.0 fL   MCH 29.7 26.0 - 34.0 pg   MCHC 33.1 30.0 - 36.0 g/dL   RDW 86.7 88.4 - 84.4 %   Platelets 181 150 - 400 K/uL   nRBC 0.0 0.0 - 0.2 %   Neutrophils Relative % 58 %   Neutro Abs 5.1 1.7 - 7.7 K/uL   Lymphocytes Relative 25 %   Lymphs Abs 2.1 0.7 - 4.0 K/uL   Monocytes Relative 9 %   Monocytes Absolute 0.8 0.1 - 1.0 K/uL   Eosinophils Relative 7 %   Eosinophils Absolute 0.6 (H) 0.0 - 0.5 K/uL   Basophils Relative 1 %   Basophils Absolute 0.1 0.0 - 0.1 K/uL   Immature Granulocytes 0 %   Abs Immature Granulocytes 0.02 0.00 - 0.07 K/uL    Comment: Performed at [facility], [address] [place], [place] [address]  Comprehensive metabolic panel with GFR     Status: Abnormal   Collection Time: 09/04/23  3:36 AM  Result Value Ref Range   Sodium 137 135 - 145 mmol/L   Potassium 3.7 3.5 - 5.1 mmol/L   Chloride 110 98 - 111 mmol/L   CO2 17 (L) 22 - 32 mmol/L   Glucose, Bld 89 70 - 99 mg/dL    Comment: Glucose reference range applies only to samples taken after fasting for at least 8 hours.   BUN 6 6 - 20 mg/dL    Creatinine, Ser 9.39 0.44 - 1.00 mg/dL   Calcium  8.6 (L) 8.9 - 10.3 mg/dL   Total Protein 5.8 (L) 6.5 - 8.1 g/dL   Albumin  2.7 (L) 3.5 - 5.0 g/dL   AST 12 (L) 15 - 41 U/L   ALT 15 0 - 44 U/L   Alkaline Phosphatase 59 38 - 126 U/L   Total Bilirubin 0.9 0.0 - 1.2 mg/dL   GFR, Estimated >39 >39 mL/min    Comment: (NOTE) Calculated using the CKD-EPI Creatinine Equation (2021)    Anion gap 10 5 - 15    Comment: Performed at [facility], [address], [place], [place] [address]  Magnesium      Status: None   Collection Time: 09/04/23  3:36 AM  Result Value  Ref Range   Magnesium  1.7 1.7 - 2.4 mg/dL    Comment: Performed at [facility], [address] [place], [place] [address]  CBC with Differential/Platelet     Status: None   Collection Time: 09/05/23  4:48 AM  Result Value Ref Range   WBC 7.9 4.0 - 10.5 K/uL   RBC 4.70 3.87 - 5.11 MIL/uL   Hemoglobin 13.6 12.0 - 15.0 g/dL   HCT 57.7 63.9 - 53.9 %   MCV 89.8 80.0 - 100.0 fL   MCH 28.9 26.0 - 34.0 pg   MCHC 32.2 30.0 - 36.0 g/dL   RDW 86.7 88.4 - 84.4 %   Platelets 237 150 - 400 K/uL   nRBC 0.0 0.0 - 0.2 %   Neutrophils Relative % 51 %   Neutro Abs 4.0 1.7 - 7.7 K/uL   Lymphocytes Relative 33 %   Lymphs Abs 2.6 0.7 - 4.0 K/uL   Monocytes Relative 9 %   Monocytes Absolute 0.7 0.1 - 1.0 K/uL   Eosinophils Relative 6 %   Eosinophils Absolute 0.5 0.0 - 0.5 K/uL   Basophils Relative 1 %   Basophils Absolute 0.1 0.0 - 0.1 K/uL   Immature Granulocytes 0 %   Abs Immature Granulocytes 0.03 0.00 - 0.07 K/uL    Comment: Performed at [facility], [address] [place], [place] [address]  Comprehensive metabolic panel with GFR     Status: Abnormal   Collection Time: 09/05/23  4:48 AM  Result Value Ref Range   Sodium 138 135 - 145 mmol/L   Potassium 3.5 3.5 - 5.1 mmol/L   Chloride 108 98 - 111 mmol/L   CO2 18 (L) 22 - 32 mmol/L   Glucose, Bld 94 70 - 99 mg/dL    Comment: Glucose reference range applies only to  samples taken after fasting for at least 8 hours.   BUN 6 6 - 20 mg/dL   Creatinine, Ser 9.40 0.44 - 1.00 mg/dL   Calcium  8.8 (L) 8.9 - 10.3 mg/dL   Total Protein 6.2 (L) 6.5 - 8.1 g/dL   Albumin  2.9 (L) 3.5 - 5.0 g/dL   AST 11 (L) 15 - 41 U/L   ALT 13 0 - 44 U/L   Alkaline Phosphatase 62 38 - 126 U/L   Total Bilirubin 0.7 0.0 - 1.2 mg/dL   GFR, Estimated >39 >39 mL/min    Comment: (NOTE) Calculated using the CKD-EPI Creatinine Equation (2021)    Anion gap 12 5 - 15    Comment: Performed at [facility], [address], [place], [place] [address]  Magnesium      Status: None   Collection Time: 09/05/23  4:48 AM  Result Value Ref Range   Magnesium  1.8 1.7 - 2.4 mg/dL    Comment: Performed at [facility], [address] [place], [place] [address]  Aerobic/Anaerobic Culture w Gram Stain (surgical/deep wound)     Status: None   Collection Time: 09/05/23  4:17 PM   Specimen: Wound  Result Value Ref Range   Specimen Description WOUND    Special Requests SUBSTERNAL    Gram Stain      FEW WBC PRESENT, PREDOMINANTLY PMN RARE GRAM POSITIVE COCCI IN PAIRS CRITICAL RESULT CALLED TO, READ BACK BY AND VERIFIED WITH: DR. KENNYTH [id number] @ 1807 FH    Culture      FEW STAPHYLOCOCCUS AUREUS NO ANAEROBES ISOLATED Performed at [facility], [address], [place], [place] [address]    Report Status 09/10/2023 FINAL    Organism ID, Bacteria STAPHYLOCOCCUS AUREUS       Susceptibility   Staphylococcus aureus - MIC*    CIPROFLOXACIN  <=0.5 SENSITIVE Sensitive     ERYTHROMYCIN  <=0.25 SENSITIVE Sensitive     GENTAMICIN  <=0.5 SENSITIVE Sensitive     OXACILLIN 0.5 SENSITIVE Sensitive     TETRACYCLINE <=1 SENSITIVE Sensitive     VANCOMYCIN  1 SENSITIVE Sensitive     TRIMETH /SULFA  <=10 SENSITIVE Sensitive     CLINDAMYCIN  <=0.25 SENSITIVE Sensitive     RIFAMPIN <=0.5 SENSITIVE Sensitive     Inducible Clindamycin  NEGATIVE Sensitive     LINEZOLID  2 SENSITIVE Sensitive     * FEW  STAPHYLOCOCCUS AUREUS  Aerobic Culture w Gram Stain (superficial specimen)     Status: None   Collection Time: 09/05/23  4:36 PM   Specimen: Wound  Result Value Ref Range   Specimen Description WOUND    Special Requests SUBXIPHOID    Gram Stain      RARE WBC PRESENT, PREDOMINANTLY PMN NO ORGANISMS SEEN Performed at [facility], [address] [place], [place] [address]    Culture FEW STAPHYLOCOCCUS AUREUS    Report Status 09/08/2023 FINAL    Organism ID, Bacteria STAPHYLOCOCCUS AUREUS       Susceptibility   Staphylococcus aureus - MIC*    CIPROFLOXACIN  <=0.5 SENSITIVE Sensitive     ERYTHROMYCIN  <=0.25 SENSITIVE Sensitive     GENTAMICIN  <=0.5 SENSITIVE Sensitive     OXACILLIN 0.5 SENSITIVE Sensitive     TETRACYCLINE <=1 SENSITIVE Sensitive     VANCOMYCIN  1 SENSITIVE Sensitive     TRIMETH /SULFA  <=10 SENSITIVE Sensitive     CLINDAMYCIN  <=0.25 SENSITIVE Sensitive     RIFAMPIN <=0.5 SENSITIVE Sensitive     Inducible Clindamycin  NEGATIVE Sensitive     LINEZOLID  2 SENSITIVE Sensitive     * FEW STAPHYLOCOCCUS AUREUS  Aerobic/Anaerobic Culture w Gram Stain (surgical/deep wound)     Status: None   Collection Time: 09/05/23  4:56 PM   Specimen: Wound  Result Value Ref Range   Specimen Description WOUND    Special Requests DEVICE    Gram Stain NO WBC SEEN NO ORGANISMS SEEN     Culture      NO GROWTH 5 DAYS NO ANAEROBES ISOLATED; CULTURE IN PROGRESS FOR 5 DAYS Performed at [facility], [address], [place], [place] [address]    Report Status 09/10/2023 FINAL   Basic metabolic panel with GFR     Status: Abnormal   Collection Time: 09/19/23 10:10 AM  Result Value Ref Range   Glucose, Bld 84 65 - 99 mg/dL    Comment: .            Fasting reference interval .    BUN 14 7 - 25 mg/dL   Creat 9.24 9.49 - 9.00 mg/dL   eGFR 896 > OR = 60 fO/fpw/8.26f7   BUN/Creatinine Ratio SEE NOTE: 6 - 22 (calc)    Comment:    Not Reported: BUN and Creatinine are within     reference range. .    Sodium 139 135 - 146 mmol/L   Potassium 4.2 3.5 - 5.3 mmol/L   Chloride 108 98 - 110 mmol/L   CO2 19 (L) 20 - 32 mmol/L   Calcium  9.2 8.6 - 10.2 mg/dL  CBC with Differential/Platelet     Status: Abnormal   Collection Time: 09/19/23 10:10 AM  Result Value  Ref Range   WBC 10.1 3.8 - 10.8 Thousand/uL   RBC 5.19 (H) 3.80 - 5.10 Million/uL   Hemoglobin 15.2 11.7 - 15.5 g/dL   HCT 51.8 (H) 64.9 - 54.9 %   MCV 92.7 80.0 - 100.0 fL   MCH 29.3 27.0 - 33.0 pg   MCHC 31.6 (L) 32.0 - 36.0 g/dL    Comment: For adults, a slight decrease in the calculated MCHC value (in the range of 30 to 32 g/dL) is most likely not clinically significant; however, it should be interpreted with caution in correlation with other red cell parameters and the patient's clinical condition.    RDW 13.0 11.0 - 15.0 %   Platelets 294 140 - 400 Thousand/uL   MPV 10.0 7.5 - 12.5 fL   Neutro Abs 6,100 1,500 - 7,800 cells/uL   Absolute Lymphocytes 2,879 850 - 3,900 cells/uL   Absolute Monocytes 697 200 - 950 cells/uL   Eosinophils Absolute 283 15 - 500 cells/uL   Basophils Absolute 141 0 - 200 cells/uL   Neutrophils Relative % 60.4 %   Total Lymphocyte 28.5 %   Monocytes Relative 6.9 %   Eosinophils Relative 2.8 %   Basophils Relative 1.4 %  C-reactive protein     Status: None   Collection Time: 09/19/23 10:10 AM  Result Value Ref Range   CRP <3.0 <8.0 mg/L  Sedimentation rate     Status: None   Collection Time: 09/19/23 10:10 AM  Result Value Ref Range   Sed Rate 2 0 - 20 mm/h       Assessment & Plan:   Assessment & Plan Abnormal white blood cell (WBC) count Rechecking CBC today in office. Adding reticulocyte count. Will call with results.  Cocaine abuse in remission (HCC) UDS in office today sent to [facility]. Will call pt with results.  Dysuria UA Dipstick in office today WNL.  Will let me know if she continues to have symptoms.  Acute upper respiratory infection Flu swab in  office today negative.  Advised supportive care.  Pt to let me know if she doesn't start to feel better.     Return in about 3 months (around 08/31/2023) for F/U.   Total time spent: 20 minutes  ALAN CHRISTELLA ARRANT, FNP  06/03/2023   This document may have been prepared by [facility] Voice Recognition software and as such may include unintentional dictation errors.

## 2023-09-27 ENCOUNTER — Telehealth: Payer: Self-pay | Admitting: Family

## 2023-09-27 ENCOUNTER — Ambulatory Visit
Admission: RE | Admit: 2023-09-27 | Discharge: 2023-09-27 | Disposition: A | Discharge status: Home / Self Care | Payer: MEDICAID | Source: Ambulatory Visit | Attending: Internal Medicine | Admitting: Internal Medicine

## 2023-09-27 DIAGNOSIS — L0390 Cellulitis, unspecified: Secondary | ICD-10-CM | POA: Diagnosis present

## 2023-09-27 MED ORDER — DEXTROSE 5 % IV SOLN
1000.0000 mg | Freq: Once | INTRAVENOUS | Status: AC
  Administered 2023-09-27: 1000 mg via INTRAVENOUS
  Filled 2023-09-27: qty 50

## 2023-09-27 NOTE — Telephone Encounter (Signed)
 Patient called in c/o dysuria and pelvic pressure. She says she believes she has a UTI and asks if Alan can send in an antibiotic for the UTI that doesn't affect the antibiotic that she is currently on for a Staph infection. She also asks for yeast infection medication because she is starting IV abx at the hospital today along with the 600 mg of  linezolid . Please send Rxs to [facility].

## 2023-09-29 ENCOUNTER — Telehealth: Payer: Self-pay | Admitting: Internal Medicine

## 2023-09-29 NOTE — Telephone Encounter (Signed)
 Called to confirm/remind patient of their appointment at [facility] on 09/30/23.   Appointment:   [x] Confirmed  [] Left mess   [] No answer/No voice mail  [] VM Full/unable to leave message  [] Phone not in service  Patient reminded to bring all medications and/or complete list.  Confirmed patient has transportation. Gave directions, instructed to utilize valet parking.

## 2023-09-30 ENCOUNTER — Ambulatory Visit: Payer: MEDICAID | Attending: Internal Medicine | Admitting: Internal Medicine

## 2023-09-30 VITALS — BP 108/64 | HR 88 | Wt 250.0 lb

## 2023-09-30 DIAGNOSIS — Z8249 Family history of ischemic heart disease and other diseases of the circulatory system: Secondary | ICD-10-CM | POA: Diagnosis not present

## 2023-09-30 DIAGNOSIS — I255 Ischemic cardiomyopathy: Secondary | ICD-10-CM | POA: Diagnosis not present

## 2023-09-30 DIAGNOSIS — Z7982 Long term (current) use of aspirin: Secondary | ICD-10-CM | POA: Diagnosis not present

## 2023-09-30 DIAGNOSIS — I110 Hypertensive heart disease with heart failure: Secondary | ICD-10-CM | POA: Insufficient documentation

## 2023-09-30 DIAGNOSIS — E039 Hypothyroidism, unspecified: Secondary | ICD-10-CM | POA: Diagnosis not present

## 2023-09-30 DIAGNOSIS — Z79899 Other long term (current) drug therapy: Secondary | ICD-10-CM | POA: Diagnosis not present

## 2023-09-30 DIAGNOSIS — Z951 Presence of aortocoronary bypass graft: Secondary | ICD-10-CM | POA: Diagnosis not present

## 2023-09-30 DIAGNOSIS — E669 Obesity, unspecified: Secondary | ICD-10-CM | POA: Diagnosis not present

## 2023-09-30 DIAGNOSIS — Z7984 Long term (current) use of oral hypoglycemic drugs: Secondary | ICD-10-CM | POA: Diagnosis not present

## 2023-09-30 DIAGNOSIS — F17210 Nicotine dependence, cigarettes, uncomplicated: Secondary | ICD-10-CM | POA: Insufficient documentation

## 2023-09-30 DIAGNOSIS — I2510 Atherosclerotic heart disease of native coronary artery without angina pectoris: Secondary | ICD-10-CM | POA: Diagnosis not present

## 2023-09-30 DIAGNOSIS — T827XXD Infection and inflammatory reaction due to other cardiac and vascular devices, implants and grafts, subsequent encounter: Secondary | ICD-10-CM

## 2023-09-30 DIAGNOSIS — I5022 Chronic systolic (congestive) heart failure: Secondary | ICD-10-CM | POA: Diagnosis not present

## 2023-09-30 DIAGNOSIS — E119 Type 2 diabetes mellitus without complications: Secondary | ICD-10-CM | POA: Diagnosis not present

## 2023-09-30 DIAGNOSIS — Z95810 Presence of automatic (implantable) cardiac defibrillator: Secondary | ICD-10-CM

## 2023-09-30 DIAGNOSIS — Z7902 Long term (current) use of antithrombotics/antiplatelets: Secondary | ICD-10-CM | POA: Insufficient documentation

## 2023-09-30 MED ORDER — DAPAGLIFLOZIN PROPANEDIOL 10 MG PO TABS: 10.0000 mg | ORAL_TABLET | Freq: Every day | ORAL | 3 refills | Status: DC

## 2023-09-30 NOTE — Addendum Note (Signed)
 Addended by: Maziah Smola M on: 09/30/2023 03:44 PM   Modules accepted: Orders

## 2023-09-30 NOTE — Progress Notes (Addendum)
 [facility] CONSULT NOTE  Referring Physician: Orlean Alan HERO, FNP Primary Care: Orlean Alan HERO, FNP Primary Cardiologist: Lonni Hanson, MD  Chief Complaint: HF  HPI:  Elizabeth Spencer is a 38 y.o. female with a history of ICM, HFrEF s/p SQ-ICD, CAD s/p CABG x 1, HTN, HLD, obesity, DM II, asthma, migraines, hypothyroidism, PTSD, anxiety and previous cocaine use who is referred by Dr. Hanson for enrollment into the HF Clinic   Patient presented to ED in May of 2024 with chest pain. Had LHC which showed hazy eccentric 90% ostial LAD stenosis concerning for acute plaque rupture, tubular 40% proximal RCA stenosis large circumflex without significant disease. EF noted to be 30-35%. She underwent single vessel CABG, LIMA-LAD on 09/02/22.   Echo 11/24 EF 30-35% -> sICD placed 08/08/23  Admitted 6/25 with ICD infection  and underwent device extraction on 09/05/23  Remains on IV abx. Says they make her nauseated. HF symptoms stable. I'm just fat and lazy. Hard to get around with stitches. No edema, orthopnea or PND.     Past Medical History:  Diagnosis Date   Aneurysm (HCC) 01/04/2014   Arthritis    Asthma    WELL CONTROLLED   Brain aneurysm 2007   NEUROLOGY NOTE DOES NOT MENTION ANEURYSM BUT PT STATES SHE DID NOT HAVE TO HAVE SURGERY   Complication of anesthesia    FOR 1 C-SECTION PT WAS ITCHING AND VERY RED ON HER FACE   Cough, persistent 01/27/2016   Dermatitis due to sunburn 11/08/2013   Family history of adverse reaction to anesthesia    brother, neice and nephew got red in face with hives   Gallstones    GERD (gastroesophageal reflux disease)    Gonorrhea 06/20/2020   Headache    CHRONIC HEADACHES   History of chronic cough    DRY   Loss of memory 05/30/2017   Perforation of left tympanic membrane 05/17/2013   Pneumonia    PTSD (post-traumatic stress disorder)    PTSD (post-traumatic stress disorder)    Thyroid  condition    PT WAS JUST TOLD ON 08-25-17 THAT  SHE HAS A THYROID  PROBLEM AND IS GOING TO F/U WITH ENDOCRINOLOGIST IN 2 WEEKS   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     ALPRAZolam  (ALPRAZOLAM  XR) 0.5 MG 24 hr tablet TAKE 1 TABLET BY MOUTH TWICE DAILY AS NEEDED FOR ANXIETY OR SLEEP 60 tablet 2   aspirin  81 MG chewable tablet Chew 1 tablet (81 mg total) by mouth daily. 90 tablet 0   atorvastatin  (LIPITOR) 40 MG tablet Take 1 tablet (40 mg total) by mouth daily. 90 tablet 3   Biotin w/ Vitamins C & E (HAIR SKIN & NAILS GUMMIES PO) Take 1 tablet by mouth daily. Biotin     cetirizine  (ZYRTEC ) 10 MG tablet Take 1 tablet (10 mg total) by mouth daily. 90 tablet 1   clopidogrel  (PLAVIX ) 75 MG tablet Take 1 tablet (75 mg total) by mouth daily. 90 tablet 3   dapagliflozin  propanediol (FARXIGA ) 10 MG TABS tablet Take 1 tablet (10 mg total) by mouth daily before breakfast. 90 tablet 1   diphenhydrAMINE  (BENADRYL ) 25 MG tablet Take 50 mg by mouth every 6 (six) hours as needed (allergic reaction). Dye and glutin free     EPIPEN  2-PAK 0.3 MG/0.3ML SOAJ injection  INJECT INTO THIGH MUSCLE THROUGH CLOTHES AS NEEDED SEVERE ALLERGIC REACTION 2 each 1   fluticasone  (FLONASE ) 50 MCG/ACT nasal spray 1 spray by Each Nare route daily. 16 g 6   furosemide  (LASIX ) 20 MG tablet Take 1 tablet (20 mg total) by mouth daily as needed. 30 tablet 2   linezolid  (ZYVOX ) 600 MG tablet Take 1 tablet (600 mg total) by mouth 2 (two) times daily. 28 tablet 0   methimazole  (TAPAZOLE ) 10 MG tablet Take 1 tablet (10 mg total) by mouth daily. (Patient taking differently: Take 15 mg by mouth daily.) 90 tablet 1   metoprolol  succinate (TOPROL  XL) 25 MG 24 hr tablet Take 1 tablet (25 mg total) by mouth daily. 90 tablet 3   montelukast  (SINGULAIR ) 10 MG tablet Take 1 tablet (10 mg total) by mouth at bedtime. 90 tablet 1    omeprazole  (PRILOSEC) 20 MG capsule Take 1 capsule (20 mg total) by mouth 2 (two) times daily. 180 capsule 1   ondansetron  (ZOFRAN -ODT) 4 MG disintegrating tablet Take 1 tablet (4 mg total) by mouth every 8 (eight) hours as needed for nausea or vomiting. 20 tablet 0   spironolactone  (ALDACTONE ) 25 MG tablet Take 1 tablet (25 mg total) by mouth daily. 90 tablet 3   traMADol  (ULTRAM ) 50 MG tablet Take 1 tablet (50 mg total) by mouth every 8 (eight) hours. 30 tablet 0   Ubrogepant  100 MG TABS Take 1 tablet by mouth See admin instructions. Take one tablet by mouth at onset of migraine. May take an additional tablet two hours later if migraine persists.  No more than 2 tablets in 24 hours.     No current facility-administered medications for this visit.    Allergies  Allergen Reactions   Azithromycin Swelling and Other (See Comments)   Clindamycin  Swelling   Codeine Hives and Other (See Comments)    Nausea/dizzy    Meloxicam  Other (See Comments)    Burns up from the inside    Neurontin [Gabapentin] Other (See Comments)    Makes her pass out and not remember what happened prior to taking med.   Penicillins Anaphylaxis    Patient allergic to all cillins Has patient had a PCN reaction causing immediate rash, facial/tongue/throat swelling, SOB or lightheadedness with hypotension: Yes Has patient had a PCN reaction causing severe rash involving mucus membranes or skin necrosis: Yes Has patient had a PCN reaction that required hospitalization: No Has patient had a PCN reaction occurring within the last 10 years: Yes If all of the above answers are NO, then may proceed with Cephalosporin use.    Clindamycin /Lincomycin Swelling   Other Hives and Other (See Comments)    Berry flavored food and drinks   Diphenhydramine  Hcl Rash and Other (See Comments)    Uses dye-free; allergic to dye in regular benadryl    Doxycycline Rash   Egg-Derived Products Rash      Social History   Socioeconomic  History   Marital status: Single    Spouse name: Not on file   Number of children: Not on file   Years of education: Not on file   Highest education level: Not on file  Occupational History   Not on file  Tobacco Use   Smoking status: Every Day    Current packs/day: 0.50    Average packs/day: 0.5 packs/day for 18.0 years (9.0 ttl pk-yrs)    Types: Cigarettes    Passive exposure: Past   Smokeless tobacco: Never  Vaping Use  Vaping status: Never Used  Substance and Sexual Activity   Alcohol use: Yes    Alcohol/week: 0.0 standard drinks of alcohol    Comment: occasionally   Drug use: Not Currently    Types: Marijuana, Cocaine   Sexual activity: Yes    Partners: Male    Birth control/protection: Surgical    Comment: Hysterectomy  Other Topics Concern   Not on file  Social History Narrative   Not on file   Social Drivers of Health   Financial Resource Strain: Not on file  Food Insecurity: No Food Insecurity (09/02/2023)   Hunger Vital Sign    Worried About Running Out of Food in the Last Year: Never true    Ran Out of Food in the Last Year: Never true  Transportation Needs: No Transportation Needs (09/02/2023)   PRAPARE - Administrator, Civil Service (Medical): No    Lack of Transportation (Non-Medical): No  Physical Activity: Not on file  Stress: Not on file  Social Connections: Unknown (08/08/2023)   Social Connection and Isolation Panel    Frequency of Communication with Friends and Family: More than three times a week    Frequency of Social Gatherings with Friends and Family: More than three times a week    Attends Religious Services: Not on Marketing executive or Organizations: Not on file    Attends Banker Meetings: Not on file    Marital Status: Living with partner  Intimate Partner Violence: Not At Risk (09/02/2023)   Humiliation, Afraid, Rape, and Kick questionnaire    Fear of Current or Ex-Partner: No    Emotionally Abused:  No    Physically Abused: No    Sexually Abused: No      Family History  Problem Relation Age of Onset   Hypertension Mother    Alcohol abuse Mother    Deep vein thrombosis Mother    Alcohol abuse Father    COPD Father    Emphysema Father    Heart disease Father     Vitals:   09/30/23 1522  BP: 108/64  Pulse: 88  SpO2: 100%  Weight: 250 lb (113.4 kg)    PHYSICAL EXAM: General:  Well appearing. No respiratory difficulty HEENT: normal Neck: supple. no JVD. Carotids 2+ bilat; no bruits. No lymphadenopathy or thryomegaly appreciated. Cor: PMI nondisplaced. Regular rate & rhythm. No rubs, gallops or murmurs. Wounds look good Lungs: clear Abdomen: obese soft, nontender, nondistended. No hepatosplenomegaly. No bruits or masses. Good bowel sounds. Extremities: no cyanosis, clubbing, rash, edema Neuro: alert & oriented x 3, cranial nerves grossly intact. moves all 4 extremities w/o difficulty. Affect pressured   ASSESSMENT & PLAN:  1. Ischemic cardiomyopathy with chronic systolic HF - CABG with LIMA-LAD on 09/02/22.  - NYHA  II-III - Volume status ok - Echo 02/24/23: EF 30-35%, G1DD, normal RV, moderate MR - [place] S-ICD implanted 08/08/23 - explanted 6/25 for wound infection - continue farxiga  10mg  daily - continue furosemide  20mg  daily PRN; reviewed parameters of when to take this - continue metoprolol  succinate 25mg  daily; BP will not allow for titration - continue spironolactone  25mg  daily - unable to tolerate losartan  due to hypotension - BP too low to tolerate further GDMT titration at this point - Can consider ivabradine down the road as needed  2. ICD device infection - device explanted 6/25 - abx per ID service  3. CAD- - saw cardiology Dene) 10/24 - CABG with LIMA-LAD on  09/02/22.  - No s/s angina - continue clopidogrel  75mg  daily - continue ASA 81mg  daily   4. Obesity - consider GLP1RA once abx done as it may make her nauseated  5. Previous cocaine  use - reports quit since 5/24   Toribio Fuel, MD  3:29 PM

## 2023-09-30 NOTE — Patient Instructions (Signed)
   Testing/Procedures:  Your provider has recommended that you have a home sleep study (Itamar Test).  We have provided you with the equipment in our office today. Please go ahead and download the app. DO NOT OPEN OR TAMPER WITH THE BOX UNTIL WE ADVISE YOU TO DO SO. Once insurance has approved the test our office will call you with PIN number and approval to proceed with testing. Once you have completed the test you just dispose of the equipment, the information is automatically uploaded to us  via blue-tooth technology. If your test is positive for sleep apnea and you need a home CPAP machine you will be contacted by Dr Dorine office [facility]) to set this up. PIN# [id number]  Follow-Up in: 3 months  At the [facility], you and your health needs are our priority. We have a designated team specialized in the treatment of Heart Failure. This Care Team includes your primary Heart Failure Specialized Cardiologist (physician), Advanced Practice Providers (APPs- Physician Assistants and Nurse Practitioners), and Pharmacist who all work together to provide you with the care you need, when you need it.   You may see any of the following providers on your designated Care Team at your next follow up:  Dr. Toribio Fuel Dr. Ezra Shuck Dr. Ria Commander Dr. Odis Brownie Ellouise Class, FNP Jaun Bash, RPH-CPP  Please be sure to bring in all your medications bottles to every appointment.   Need to Contact Us :  If you have any questions or concerns before your next appointment please send us  a message through [facility] or call our office at [phone].    TO LEAVE A MESSAGE FOR THE NURSE SELECT OPTION 2, PLEASE LEAVE A MESSAGE INCLUDING: YOUR NAME DATE OF BIRTH CALL BACK NUMBER REASON FOR CALL**this is important as we prioritize the call backs  YOU WILL RECEIVE A CALL BACK THE SAME DAY AS LONG AS YOU CALL BEFORE 4:00 PM

## 2023-10-04 ENCOUNTER — Other Ambulatory Visit (HOSPITAL_COMMUNITY): Payer: Self-pay | Admitting: Internal Medicine

## 2023-10-04 ENCOUNTER — Ambulatory Visit: Payer: MEDICAID | Attending: Cardiology

## 2023-10-04 ENCOUNTER — Telehealth: Payer: Self-pay | Admitting: Licensed Clinical Social Worker

## 2023-10-04 ENCOUNTER — Ambulatory Visit: Admission: RE | Admit: 2023-10-04 | Payer: MEDICAID | Source: Ambulatory Visit

## 2023-10-04 ENCOUNTER — Other Ambulatory Visit: Payer: Self-pay

## 2023-10-04 DIAGNOSIS — T827XXD Infection and inflammatory reaction due to other cardiac and vascular devices, implants and grafts, subsequent encounter: Secondary | ICD-10-CM

## 2023-10-04 DIAGNOSIS — A4901 Methicillin susceptible Staphylococcus aureus infection, unspecified site: Secondary | ICD-10-CM

## 2023-10-04 DIAGNOSIS — I5022 Chronic systolic (congestive) heart failure: Secondary | ICD-10-CM

## 2023-10-04 NOTE — Patient Instructions (Signed)
 Follow up as scheduled.

## 2023-10-04 NOTE — Telephone Encounter (Signed)
 H&V Care Navigation CSW Progress Note  Clinical Social Worker received a call back from pt sharing that her partner may not be able to get off of work per his boss. She is requesting to be admitted post procedure, that is up to discretion of medical staff/hospital team. I have sent this update to [place], RN.  Patient is participating in a Managed Medicaid Plan:  No, [employer] tailored care plan  SDOH Screenings   Food Insecurity: No Food Insecurity (09/02/2023)  Housing: Low Risk  (09/02/2023)  Transportation Needs: No Transportation Needs (09/02/2023)  Utilities: Not At Risk (09/02/2023)  Depression (PHQ2-9): Low Risk  (09/19/2023)  Social Connections: Unknown (08/08/2023)  Tobacco Use: High Risk (09/27/2023)    Elizabeth Spencer, MSW, LCSW Clinical Social Worker II [facility] Heart/Vascular Care Navigation  [phone]- work cell phone (preferred)

## 2023-10-04 NOTE — Telephone Encounter (Signed)
 H&V Care Navigation CSW Progress Note  Clinical Social Worker contacted patient by phone to f/u on ride for procedure on 7/3. I was able to schedule a ride for pt through [employer] with [employer] (tailored medicaid).  Pt will be picked up at her apartment on 7/3 at 11am. I scheduled the ride home to be will call since we are not sure when procedure will be done.   Attempted pt x2 to provide this information. Phone rang to a full voicemail.   Pt then returned my call and I shared the above update. Pt inquired about packing clothes since previous procedures have led to her staying overnight- advised her I could not know if she would be kept for observation or not but if it would make her feel better to have an extra set of clothes she can pack those and have her significant other keep them.   Pt concerned that ride services close at 5pm and her procedure may not be done by then. Per [facility] website they are open 7am-6pm, also 24 hour service for urgent hospital discharges also available at [phone]. I will send Carly, RN, a message with details for ride in case any issues. If ride through insurance truly not available then ride will need to be arranged by inpatient [facility] team.   Patient is participating in a Managed Medicaid Plan:  No, [employer] Tailored Care Plan  SDOH Screenings   Food Insecurity: No Food Insecurity (09/02/2023)  Housing: Low Risk  (09/02/2023)  Transportation Needs: No Transportation Needs (09/02/2023)  Utilities: Not At Risk (09/02/2023)  Depression (PHQ2-9): Low Risk  (09/19/2023)  Social Connections: Unknown (08/08/2023)  Tobacco Use: High Risk (09/27/2023)     Elizabeth Spencer, MSW, LCSW Clinical Social Worker II [facility] Heart/Vascular Care Navigation  [phone]- work cell phone (preferred)

## 2023-10-05 ENCOUNTER — Ambulatory Visit
Admission: RE | Admit: 2023-10-05 | Discharge: 2023-10-05 | Disposition: A | Discharge status: Home / Self Care | Payer: MEDICAID | Source: Ambulatory Visit | Attending: Cardiology | Admitting: Cardiology

## 2023-10-05 DIAGNOSIS — A4901 Methicillin susceptible Staphylococcus aureus infection, unspecified site: Secondary | ICD-10-CM

## 2023-10-05 DIAGNOSIS — Z01818 Encounter for other preprocedural examination: Secondary | ICD-10-CM | POA: Insufficient documentation

## 2023-10-05 MED ORDER — DEXTROSE 5 % IV SOLN
1500.0000 mg | Freq: Once | INTRAVENOUS | Status: AC
  Administered 2023-10-05: 1500 mg via INTRAVENOUS
  Filled 2023-10-05: qty 75

## 2023-10-05 NOTE — Progress Notes (Signed)
 Dalvacin  infusion was completed. Per patient , she does not know if she needs to come in for another infusion therefore no future appointment needed at this time.

## 2023-10-05 NOTE — Progress Notes (Signed)
 Pt seen in device clinic for follow up for wound care.  Pt has 3 wound sites.  Upper sternal wound, mid abdomen wound below sternum and left side where S-ICD was removed.  Wounds/wound care were evaluated with Dr. Kennyth.  Pt today complains that the mid abdomen below sternal wound is painful and she would like to have these stitches removed.  Redness and discomfort noted at site of stitches.  Per Dr. Kennyth, these were removed today.  There remains a small open area that does not appear infected with slight drainage.  Left side wound evaluated and stitches will be left in place at this time.  There is a small open area also noted at this site that does not appear infected with slight to moderate drainage per Pt.  Drainage increases positionally.    Upper sternal wound evaluated.   Per Dr. Kennyth will schedule Pt for wound debridement at next available time.   Pt scheduled for procedure 10/06/2023.  Post procedure Pt will follow with wound clinic.  Follow up will be scheduled.

## 2023-10-06 ENCOUNTER — Other Ambulatory Visit: Payer: Self-pay

## 2023-10-06 ENCOUNTER — Emergency Department: Payer: MEDICAID

## 2023-10-06 ENCOUNTER — Ambulatory Visit (HOSPITAL_COMMUNITY): Payer: MEDICAID | Admitting: Anesthesiology

## 2023-10-06 ENCOUNTER — Other Ambulatory Visit: Payer: Self-pay | Admitting: Cardiology

## 2023-10-06 ENCOUNTER — Emergency Department
Admission: EM | Admit: 2023-10-06 | Discharge: 2023-10-07 | Disposition: A | Discharge status: Home / Self Care | Payer: MEDICAID | Attending: Emergency Medicine | Admitting: Emergency Medicine

## 2023-10-06 ENCOUNTER — Encounter (HOSPITAL_COMMUNITY)
Admission: RE | Disposition: A | Discharge status: Home / Self Care | Payer: Self-pay | Source: Home / Self Care | Attending: Cardiology

## 2023-10-06 ENCOUNTER — Ambulatory Visit (HOSPITAL_COMMUNITY)
Admission: RE | Admit: 2023-10-06 | Discharge: 2023-10-06 | Disposition: A | Discharge status: Home / Self Care | Payer: MEDICAID | Attending: Cardiology | Admitting: Cardiology

## 2023-10-06 DIAGNOSIS — F17210 Nicotine dependence, cigarettes, uncomplicated: Secondary | ICD-10-CM | POA: Diagnosis not present

## 2023-10-06 DIAGNOSIS — T8141XA Infection following a procedure, superficial incisional surgical site, initial encounter: Secondary | ICD-10-CM

## 2023-10-06 DIAGNOSIS — E0590 Thyrotoxicosis, unspecified without thyrotoxic crisis or storm: Secondary | ICD-10-CM | POA: Diagnosis not present

## 2023-10-06 DIAGNOSIS — I5022 Chronic systolic (congestive) heart failure: Secondary | ICD-10-CM | POA: Diagnosis not present

## 2023-10-06 DIAGNOSIS — Z951 Presence of aortocoronary bypass graft: Secondary | ICD-10-CM | POA: Insufficient documentation

## 2023-10-06 DIAGNOSIS — I2510 Atherosclerotic heart disease of native coronary artery without angina pectoris: Secondary | ICD-10-CM | POA: Insufficient documentation

## 2023-10-06 DIAGNOSIS — I252 Old myocardial infarction: Secondary | ICD-10-CM | POA: Insufficient documentation

## 2023-10-06 DIAGNOSIS — L089 Local infection of the skin and subcutaneous tissue, unspecified: Secondary | ICD-10-CM | POA: Diagnosis not present

## 2023-10-06 DIAGNOSIS — I9789 Other postprocedural complications and disorders of the circulatory system, not elsewhere classified: Secondary | ICD-10-CM | POA: Diagnosis present

## 2023-10-06 DIAGNOSIS — E785 Hyperlipidemia, unspecified: Secondary | ICD-10-CM | POA: Insufficient documentation

## 2023-10-06 DIAGNOSIS — I509 Heart failure, unspecified: Secondary | ICD-10-CM | POA: Insufficient documentation

## 2023-10-06 DIAGNOSIS — F418 Other specified anxiety disorders: Secondary | ICD-10-CM | POA: Diagnosis not present

## 2023-10-06 DIAGNOSIS — E041 Nontoxic single thyroid nodule: Secondary | ICD-10-CM | POA: Diagnosis not present

## 2023-10-06 DIAGNOSIS — T827XXS Infection and inflammatory reaction due to other cardiac and vascular devices, implants and grafts, sequela: Secondary | ICD-10-CM

## 2023-10-06 DIAGNOSIS — T148XXA Other injury of unspecified body region, initial encounter: Secondary | ICD-10-CM

## 2023-10-06 DIAGNOSIS — G4730 Sleep apnea, unspecified: Secondary | ICD-10-CM | POA: Diagnosis not present

## 2023-10-06 DIAGNOSIS — Z4801 Encounter for change or removal of surgical wound dressing: Secondary | ICD-10-CM | POA: Diagnosis not present

## 2023-10-06 DIAGNOSIS — X58XXXA Exposure to other specified factors, initial encounter: Secondary | ICD-10-CM | POA: Diagnosis not present

## 2023-10-06 DIAGNOSIS — K219 Gastro-esophageal reflux disease without esophagitis: Secondary | ICD-10-CM | POA: Insufficient documentation

## 2023-10-06 DIAGNOSIS — Z5189 Encounter for other specified aftercare: Secondary | ICD-10-CM

## 2023-10-06 HISTORY — PX: IMPLANT POCKET INCISION & DRAINAGE: EP1210

## 2023-10-06 MED ORDER — MIDAZOLAM HCL 2 MG/2ML IJ SOLN
INTRAMUSCULAR | Status: DC | PRN
Start: 2023-10-06 — End: 2023-10-06
  Administered 2023-10-06: 2 mg via INTRAVENOUS

## 2023-10-06 MED ORDER — SODIUM CHLORIDE 0.9 % IV SOLN
INTRAVENOUS | Status: AC
  Filled 2023-10-06: qty 2

## 2023-10-06 MED ORDER — BUPIVACAINE HCL (PF) 0.25 % IJ SOLN
INTRAMUSCULAR | Status: AC
  Filled 2023-10-06: qty 60

## 2023-10-06 MED ORDER — TRAMADOL HCL 50 MG PO TABS
50.0000 mg | ORAL_TABLET | Freq: Once | ORAL | Status: AC
  Administered 2023-10-06: 50 mg via ORAL
  Filled 2023-10-06: qty 1

## 2023-10-06 MED ORDER — BUPIVACAINE HCL (PF) 0.25 % IJ SOLN
INTRAMUSCULAR | Status: AC
Start: 2023-10-06 — End: 2023-10-06
  Filled 2023-10-06: qty 30

## 2023-10-06 MED ORDER — NITROFURANTOIN MONOHYD MACRO 100 MG PO CAPS
100.0000 mg | ORAL_CAPSULE | Freq: Two times a day (BID) | ORAL | 0 refills | Status: AC
Start: 2023-10-06 — End: 2023-10-13

## 2023-10-06 MED ORDER — ROCURONIUM BROMIDE 10 MG/ML (PF) SYRINGE
PREFILLED_SYRINGE | INTRAVENOUS | Status: DC | PRN
  Administered 2023-10-06: 40 mg via INTRAVENOUS

## 2023-10-06 MED ORDER — ACETAMINOPHEN 500 MG PO TABS
ORAL_TABLET | ORAL | Status: AC
  Administered 2023-10-06: 1000 mg
  Filled 2023-10-06: qty 2

## 2023-10-06 MED ORDER — FENTANYL CITRATE (PF) 100 MCG/2ML IJ SOLN
INTRAMUSCULAR | Status: AC
  Filled 2023-10-06: qty 2

## 2023-10-06 MED ORDER — BUPIVACAINE HCL (PF) 0.25 % IJ SOLN
INTRAMUSCULAR | Status: DC | PRN
  Administered 2023-10-06 (×2): 30 mL

## 2023-10-06 MED ORDER — PROPOFOL 10 MG/ML IV BOLUS
INTRAVENOUS | Status: DC | PRN
  Administered 2023-10-06: 50 mg via INTRAVENOUS
  Administered 2023-10-06: 120 mg via INTRAVENOUS

## 2023-10-06 MED ORDER — DEXMEDETOMIDINE HCL IN NACL 80 MCG/20ML IV SOLN
INTRAVENOUS | Status: DC | PRN
  Administered 2023-10-06 (×2): 8 ug via INTRAVENOUS

## 2023-10-06 MED ORDER — SODIUM CHLORIDE 0.9 % IV SOLN: INTRAVENOUS | Status: DC

## 2023-10-06 MED ORDER — SODIUM CHLORIDE 0.9 % IV SOLN
80.0000 mg | INTRAVENOUS | Status: AC
  Administered 2023-10-06: 80 mg

## 2023-10-06 MED ORDER — ACETAMINOPHEN 500 MG PO TABS
1000.0000 mg | ORAL_TABLET | Freq: Once | ORAL | Status: AC
  Administered 2023-10-06: 1000 mg via ORAL
  Filled 2023-10-06: qty 2

## 2023-10-06 MED ORDER — VANCOMYCIN HCL 1500 MG/300ML IV SOLN
1500.0000 mg | INTRAVENOUS | Status: AC
  Administered 2023-10-06: 1500 mg via INTRAVENOUS
  Filled 2023-10-06: qty 300

## 2023-10-06 MED ORDER — ONDANSETRON HCL 4 MG/2ML IJ SOLN
INTRAMUSCULAR | Status: DC | PRN
  Administered 2023-10-06: 4 mg via INTRAVENOUS

## 2023-10-06 MED ORDER — ACETAMINOPHEN 500 MG PO TABS: 1000.0000 mg | ORAL_TABLET | Freq: Once | ORAL | Status: DC

## 2023-10-06 MED ORDER — LACTATED RINGERS IV SOLN: INTRAVENOUS | Status: DC | PRN

## 2023-10-06 MED ORDER — LIDOCAINE 2% (20 MG/ML) 5 ML SYRINGE
INTRAMUSCULAR | Status: DC | PRN
  Administered 2023-10-06: 40 mg via INTRAVENOUS

## 2023-10-06 MED ORDER — ONDANSETRON HCL 4 MG/2ML IJ SOLN: 4.0000 mg | Freq: Four times a day (QID) | INTRAMUSCULAR | Status: DC | PRN

## 2023-10-06 MED ORDER — PROPOFOL 500 MG/50ML IV EMUL
INTRAVENOUS | Status: DC | PRN
  Administered 2023-10-06: 150 ug/kg/min via INTRAVENOUS

## 2023-10-06 MED ORDER — SUCCINYLCHOLINE CHLORIDE 200 MG/10ML IV SOSY
PREFILLED_SYRINGE | INTRAVENOUS | Status: DC | PRN
  Administered 2023-10-06: 120 mg via INTRAVENOUS

## 2023-10-06 MED ORDER — MIDAZOLAM HCL 2 MG/2ML IJ SOLN
INTRAMUSCULAR | Status: AC
  Filled 2023-10-06: qty 2

## 2023-10-06 MED ORDER — FLUCONAZOLE 150 MG PO TABS: 150.0000 mg | ORAL_TABLET | Freq: Every day | ORAL | 0 refills | Status: DC

## 2023-10-06 MED ORDER — ACETAMINOPHEN 325 MG PO TABS: 325.0000 mg | ORAL_TABLET | ORAL | Status: DC | PRN

## 2023-10-06 MED ORDER — SUGAMMADEX SODIUM 200 MG/2ML IV SOLN
INTRAVENOUS | Status: DC | PRN
  Administered 2023-10-06: 200 mg via INTRAVENOUS

## 2023-10-06 MED ORDER — CHLORHEXIDINE GLUCONATE 4 % EX SOLN
4.0000 | Freq: Once | CUTANEOUS | Status: DC
  Filled 2023-10-06: qty 60

## 2023-10-06 MED ORDER — ACETAMINOPHEN 325 MG PO TABS
ORAL_TABLET | ORAL | Status: AC
  Administered 2023-10-06: 325 mg
  Filled 2023-10-06: qty 2

## 2023-10-06 MED ORDER — POVIDONE-IODINE 10 % EX SWAB: 2.0000 | Freq: Once | CUTANEOUS | Status: DC

## 2023-10-06 MED ORDER — DEXAMETHASONE SODIUM PHOSPHATE 10 MG/ML IJ SOLN
INTRAMUSCULAR | Status: DC | PRN
  Administered 2023-10-06: 10 mg via INTRAVENOUS

## 2023-10-06 NOTE — Anesthesia Preprocedure Evaluation (Addendum)
 Anesthesia Evaluation  Patient identified by MRN, date of birth, ID band Patient awake    Reviewed: Allergy & Precautions, NPO status , Patient's Chart, lab work & pertinent test results, reviewed documented beta blocker date and time   History of Anesthesia Complications (+) Family history of anesthesia reaction and history of anesthetic complications (red face with c-section; brother, niece, and nephew had hives and red face)  Airway Mallampati: II  TM Distance: >3 FB Neck ROM: Full   Comment: Previous grade I view with MAC 4, easy mask Dental  (+) Dental Advisory Given, Poor Dentition, Chipped, Missing,    Pulmonary shortness of breath and with exertion, asthma (no recent flares) , sleep apnea (does not use CPAP) , pneumonia, Current Smoker and Patient abstained from smoking.   Pulmonary exam normal breath sounds clear to auscultation       Cardiovascular (-) angina + CAD, + Past MI, + CABG (09/02/2022), +CHF (EF 30-35%) and + DOE  (-) Cardiac Defibrillator + Valvular Problems/Murmurs (moderate) MR  Rhythm:Regular Rate:Normal  HLD  TTE 02/24/2023: IMPRESSIONS    1. Left ventricular ejection fraction, by estimation, is 30 to 35%. The left ventricle has moderately decreased function. The left ventricle demonstrates global hypokinesis. Left ventricular diastolic parameters are consistent with Grade I diastolic dysfunction (impaired relaxation). The average left ventricular global longitudinal strain is -9.4 %.   2. Right ventricular systolic function is normal. The right ventricular size is normal. Tricuspid regurgitation signal is inadequate for assessing PA pressure.   3. The mitral valve is normal in structure. Moderate mitral valve regurgitation. No evidence of mitral stenosis.   4. The aortic valve is normal in structure. Aortic valve regurgitation is not visualized. No aortic stenosis is present.   5. The inferior vena cava is  normal in size with greater than 50% respiratory variability, suggesting right atrial pressure of 3 mmHg.     Neuro/Psych  Headaches, neg Seizures PSYCHIATRIC DISORDERS (PTSD) Anxiety Depression Bipolar Disorder   Brain aneurysm    GI/Hepatic ,GERD  Medicated and Controlled,,(+)     substance abuse  cocaine use  Endo/Other  neg diabetes Hyperthyroidism (on methimazole )   Renal/GU negative Renal ROS     Musculoskeletal  (+) Arthritis ,    Abdominal  (+) + obese  Peds  Hematology negative hematology ROS (+) Lab Results      Component                Value               Date                      WBC                      7.9                 09/05/2023                HGB                      13.6                09/05/2023                HCT                      42.2  09/05/2023                MCV                      89.8                09/05/2023                PLT                      237                 09/05/2023              Anesthesia Other Findings On Plavix   Reproductive/Obstetrics                              Anesthesia Physical Anesthesia Plan  ASA: 4  Anesthesia Plan: General   Post-op Pain Management: Tylenol  PO (pre-op)*   Induction: Intravenous  PONV Risk Score and Plan: 2 and Ondansetron , Dexamethasone , Treatment may vary due to age or medical condition and Midazolam   Airway Management Planned: Oral ETT  Additional Equipment:   Intra-op Plan:   Post-operative Plan: Extubation in OR  Informed Consent: I have reviewed the patients History and Physical, chart, labs and discussed the procedure including the risks, benefits and alternatives for the proposed anesthesia with the patient or authorized representative who has indicated his/her understanding and acceptance.     Dental advisory given  Plan Discussed with: CRNA  Anesthesia Plan Comments:          Anesthesia Quick Evaluation

## 2023-10-06 NOTE — ED Provider Notes (Signed)
 [facility] Provider Note    Event Date/Time   First MD Initiated Contact with Patient 10/06/23 2257     (approximate)   History   Wound Check   HPI  Elizabeth Spencer is a 38 y.o. female with history of bipolar disorder, CAD status post CABG, cardiomyopathy and CHF presenting with serosanguineous drainage from her ICD site.  Patient states that she was sent in because she was on anticoagulation and had persistent serosanguineous drainage from the surgical sites from where her ICD was removed.  She denies any purulent drainage from the wounds recently.  No shortness of breath or fever.  No cough.    On independent review, she was seen by cardiology today, she had undergone an ICD implanted but unfortunately was complicated by soft tissue infection necessitating device removal.  She was seen today for a wound check.  No longer draining from her suprasternal incision, had drainage from her subxiphoid and mid axillary incisions in the last couple days that had reportedly stopped. She is currently on the linezolid  and is going to follow-up with infectious disease soon for further management of the infections.  Per the note, she had missed doses of oral antibiotics and has trouble adhering to the IV antibiotic regiment.  Also having difficulty keeping the incision sites clean.  Did not also mention purulent drainage coming from the suprastomal incision that was cultured and grew MSSA  from the suprasternal as well as subxiphoid incisions  Physical Exam   Triage Vital Signs: ED Triage Vitals  Encounter Vitals Group     BP      Girls Systolic BP Percentile      Girls Diastolic BP Percentile      Boys Systolic BP Percentile      Boys Diastolic BP Percentile      Pulse      Resp      Temp      Temp src      SpO2      Weight      Height      Head Circumference      Peak Flow      Pain Score      Pain Loc      Pain Education      Exclude from Growth Chart      Most recent vital signs: Vitals:   10/07/23 0030 10/07/23 0130  BP: 90/69 103/85  Pulse: 88 97  Resp: 14 (!) 26  Temp:    SpO2: 99% 97%     General: Awake, no distress.  CV:  Good peripheral perfusion.  Resp:  Normal effort.  No tachypnea or respiratory distress, she has gauze over the suprasternal and subxiphoid sites that have serosanguineous discharge on both, for both incision sites, there is no active drainage at this time, she does not have tenderness on palpation, no overlying erythema, induration or fluctuance palpated.  No frank bleeding Abd:  No distention.  Soft nontender Other:  No unilateral casting or tenderness   ED Results / Procedures / Treatments   Labs (all labs ordered are listed, but only abnormal results are displayed) Labs Reviewed  CBC WITH DIFFERENTIAL/PLATELET - Abnormal; Notable for the following components:      Result Value   Hemoglobin 15.5 (*)    HCT 46.8 (*)    Neutro Abs 7.9 (*)    All other components within normal limits  COMPREHENSIVE METABOLIC PANEL WITH GFR - Abnormal; Notable for the  following components:   CO2 17 (*)    Glucose, Bld 110 (*)    All other components within normal limits  CULTURE, BLOOD (ROUTINE X 2)  CULTURE, BLOOD (ROUTINE X 2)  LACTIC ACID, PLASMA  LACTIC ACID, PLASMA  TROPONIN I (HIGH SENSITIVITY)  TROPONIN I (HIGH SENSITIVITY)     EKG  EKG shows, sinus tachycardia, rate 103, normal QRS, normal QTc, there is artifact but no obvious ST elevations, T wave flattening in aVL, V2, not significant change compared to prior   RADIOLOGY On my independent interpretation, chest x-ray without obvious consolidation   PROCEDURES:  Critical Care performed: No  Procedures   MEDICATIONS ORDERED IN ED: Medications  iohexol  (OMNIPAQUE ) 300 MG/ML solution 75 mL (75 mLs Intravenous Contrast Given 10/07/23 0242)     IMPRESSION / MDM / ASSESSMENT AND PLAN / ED COURSE  I reviewed the triage vital signs and the  nursing notes.                              Differential diagnosis includes, but is not limited to, ICD site infection, slow wound healing, deeper infection, persistent drainage secondary to anticoagulation.  Will get labs, lactic acid, blood cultures, chest x-ray, EKG, troponin.  Patient's presentation is most consistent with acute presentation with potential threat to life or bodily function.  Independent interpretation of labs and imaging below.  Chest x-ray did not show any focal findings, will get a CT chest.  CT chest results are below.  Consulted cardiology who states that pocket of gas is from the I&D site, felt that thickening seen at the medial left pectoralis major is not consistent with myositis.  On reassessment patient has no drainage from the suprasternal and axillary sites, does have mild serosanguineous drainage on the gauze at the subxiphoid site, when I checked, there is no active drainage, no fluctuance, induration, purulent drainage from any of her surgical sites.  Per recommendations of cardiology, she wants the surgical sites to be chlorhexidine  and redressed.  Okay to be DC'd.  Can follow-up with the EP who did her I&D outpatient.  Instructed patient to take her antibiotics as prescribed and to follow-up with infectious disease for further management of her antibiotics.  Also instructed her to follow-up with her primary care doctor for further management of the incidental finding of a thyroid  nodule on CT.  Otherwise considered but no indication for inpatient admission at this time, she safe for outpatient management.  Will discharge with strict return precautions.  The patient is on the cardiac monitor to evaluate for evidence of arrhythmia and/or significant heart rate changes.   Clinical Course as of 10/07/23 0500  Fri Oct 07, 2023  0027 DG Chest 1 View IMPRESSION: 1. Evidence of prior median sternotomy/CABG. 2. No acute or active cardiopulmonary disease.   [TT]  9657  Independent review of labs, no leukocytosis, lactate is normal, troponin is not elevated, electrolytes not severely deranged, LFTs are normal. [TT]  0356 CT Chest W Contrast IMPRESSION: Pockets of gas in the anterior chest wall mild adjacent stranding. Similar vague thickening of the medial left pectoralis major may be due to myositis. No abscess.  Multiple right thyroid  nodules measuring up to 1.5 cm. Thyroid  ultrasound is recommended if not performed previously.   [TT]    Clinical Course User Index [TT] Waymond Lorelle Cummins, MD     FINAL CLINICAL IMPRESSION(S) / ED DIAGNOSES   Final diagnoses:  Visit for wound check  Wound drainage  Thyroid  nodule     Rx / DC Orders   ED Discharge Orders     None        Note:  This document was prepared using Dragon voice recognition software and may include unintentional dictation errors.    Waymond Lorelle Cummins, MD 10/07/23 0500

## 2023-10-06 NOTE — ED Triage Notes (Signed)
 Pt brought in by EMS for issue with issues with surgical wounds on chest.  Pt had pacemaker placed 2 months ago and wounds became infected and pt was admitted with sepsis.  Pt had procedure done earlier today and noticed increased bleeding coming from wounds.  She called her cardiologist who advised her to come to ED.  Pt reports taking 0.5mg  xanax  for anxiety prior to EMS arrival.  Pt is AAOx4, slightly tachycardic, all other VSS.

## 2023-10-06 NOTE — Telephone Encounter (Signed)
 ON-CALL CARDIOLOGY NOTE  Elizabeth Spencer called the on-call cardiology pager to discuss bleeding after hier I&D today. After she left the hospital, she noticed her shirt was wet. She changed it and later realized that her gauze on top of the incision site had come undone and she had been bleeding for the last few hours. She is not able to quantify the amount of blood. Since I am not able to see the wound, I recommended her to go to the nearest ED for evaluation. She will call EMS; [facility] is the closet ED. If she arrives there overnight, I will coordinate with ED team for next steps.    Merlene Blood, MD MS  Cardiology [facility]

## 2023-10-06 NOTE — Progress Notes (Signed)
 Pt was repeatedly cursing at nurses upon arrival in the recovery area, stating,  shut the fuck up, I have a fucking headache,  fuck is going on, get the fuck out of here.    Pt was asked many times to not use foul language and that we are trying to get report and trying to help her.   Pt also repeatedly removed her ETCO2  cannula.

## 2023-10-06 NOTE — Discharge Instructions (Signed)
 Implantable Cardiac Device Extraction, Care After  This sheet gives you information about how to care for yourself after your procedure. Your health care provider may also give you more specific instructions. If you have problems or questions, contact your health care provider.  What can I expect after the procedure? After your procedure, it is common to have: Pain or soreness at the site where the cardiac device was removed. Mild Swelling at the site where the cardiac device was inserted.  Follow these instructions at home: Incision care  Keep the incision clean and dry. Do not take baths, swim, or use a hot tub until after your wound check.  Do not shower for at least 7 days, or as directed by your health care provider. Pat the area dry with a clean towel. Do not rub the area. This may cause bleeding. Follow instructions from your health care provider about how to take care of your incision. Make sure you: Leave stitches (sutures), skin glue, or adhesive strips in place. These skin closures may need to stay in place for 2 weeks or longer. If adhesive strip edges start to loosen and curl up, you may trim the loose edges. Do not remove adhesive strips completely unless your health care provider tells you to do that. Check around your incision area every day for signs of infection. Check for: More redness, swelling, or pain. More fluid or blood. Warmth. Pus or a bad smell. Activity Do not lift anything that is heavier than 10 lb (4.5 kg) until your health care provider says it is okay to do so. For the first week, or as long as told by your health care provider: Avoid lifting your affected arm higher than your shoulder. Avoid strenuous exercise. Ask your health care provider when it is okay to: Resume your normal activities. Return to work or school. Resume sexual activity. Contact a health care provider if: You have any of these around your incision site or coming from it: More  redness, swelling, or pain. Fluid or blood. Warmth to the touch. Pus or a bad smell. You have a fever. Get help right away if: You experience chest pain that is different from the pain at the cardiac device site. You develop a red streak that extends above or below the incision site. You experience shortness of breath. You have light-headedness that does not go away quickly. You faint or have dizzy spells. Your pulse suddenly drops or increases rapidly and does not return to normal. You begin to gain weight and your legs and ankles swell. Summary After your procedure, it is common to have pain, soreness, and some swelling where the cardiac device was removed. Make sure to keep your incision clean and dry. Follow instructions from your health care provider about how to take care of your incision. Check your incision every day for signs of infection, such as more pain or swelling, pus or a bad smell, warmth, or leaking fluid and blood. Avoid strenuous exercise and lifting your left arm higher than your shoulder for 2 weeks, or as long as told by your health care provider. This information is not intended to replace advice given to you by your health care provider. Make sure you discuss any questions you have with your health care provider.

## 2023-10-06 NOTE — Telephone Encounter (Signed)
 Per Alan verbal, okay to send in fluconazole  and macrobid , rxs have been sent

## 2023-10-06 NOTE — Anesthesia Procedure Notes (Signed)
 Procedure Name: Intubation Date/Time: 10/06/2023 4:19 PM  Performed by: Arvell Edsel HERO, CRNAPre-anesthesia Checklist: Suction available, Emergency Drugs available, Patient identified, Patient being monitored and Timeout performed Patient Re-evaluated:Patient Re-evaluated prior to induction Oxygen Delivery Method: Circle system utilized Preoxygenation: Pre-oxygenation with 100% oxygen Induction Type: IV induction and Rapid sequence Laryngoscope Size: Glidescope and 3 Grade View: Grade I Tube size: 7.5 mm Number of attempts: 1 Airway Equipment and Method: Video-laryngoscopy Placement Confirmation: ETT inserted through vocal cords under direct vision, CO2 detector, positive ETCO2 and breath sounds checked- equal and bilateral Secured at: 23 cm Tube secured with: Tape

## 2023-10-06 NOTE — Transfer of Care (Signed)
 Immediate Anesthesia Transfer of Care Note  Patient: Elizabeth Spencer  Procedure(s) Performed: IMPLANT POCKET INCISION & DRAINAGE  Patient Location: Cath Lab  Anesthesia Type:General  Level of Consciousness: awake, alert , and oriented  Airway & Oxygen Therapy: Patient Spontanous Breathing  Post-op Assessment: Report given to RN, Post -op Vital signs reviewed and stable, Patient moving all extremities, Patient moving all extremities X 4, and Patient able to stick tongue midline  Post vital signs: Reviewed and stable  Last Vitals:  Vitals Value Taken Time  BP    Temp    Pulse    Resp    SpO2      Last Pain:  Vitals:   10/06/23 1255  TempSrc:   PainSc: 0-No pain         Complications: No notable events documented.

## 2023-10-06 NOTE — H&P (Signed)
 Electrophysiology Note:   Date:  10/06/23  ID:  Devere FORBES Kurtz, DOB 1986/01/26, MRN [id number]   Primary Cardiologist: Lonni Hanson, MD Electrophysiologist: Fonda Kitty, MD       History of Present Illness:   Elizabeth Spencer is a 38 y.o. 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 She is being seen today for wound check.   Discussed the use of AI scribe software for clinical note transcription with the patient, who gave verbal consent to proceed.   History of Present Illness KAYANN MAJ is a 38 year old female with a history of open heart surgery and defibrillator placement who presents for wound care and pain management following recent hospital discharge against medical advice. She is accompanied by her boyfriend who assists with her care. Since her discharge, she has been experiencing issues with wound drainage and pain management. Each wound has drained at various times. No longer draining from suprasternal incision since right after explant. Subxiphoid and midaxillary incisions drained in more recent days, but has since stopped. She has been using gauze and tape to manage the drainage and prevent irritation. No redness or swelling is noted at the wound sites. She has itching at the sites, particularly the subxiphoid incision. She experiences significant pain and muscle spasms, particularly in the area of the largest wound. She has been using tramadol  to manage the pain, which she finds effective. She is currently on a strong antibiotic regimen and plans to follow up with ID soon.   Interval: Patient presents for planned revision  of suprasternal incision. She has missed doses of oral antibiotics. Having trouble with adhering to outpatient IV antibiotic regimen. Difficulty keeping incisions clean and dry. She has itching and picks at wounds, places bandages over them to minimize this. Some drainage. No systemic infectious symptoms.    Review of systems complete and found to be negative unless listed in HPI.    EP Information / Studies Reviewed:     EKG is not ordered today. EKG from 09/01/23 reviewed which showed sinus tach.        Echo 02/24/23:  1. Left ventricular ejection fraction, by estimation, is 30 to 35%. The  left ventricle has moderately decreased function. The left ventricle  demonstrates global hypokinesis. Left ventricular diastolic parameters are  consistent with Grade I diastolic  dysfunction (impaired relaxation). The average left ventricular global  longitudinal strain is -9.4 %.   2. Right ventricular systolic function is normal. The right ventricular  size is normal. Tricuspid regurgitation signal is inadequate for assessing  PA pressure.   3. The mitral valve is normal in structure. Moderate mitral valve  regurgitation. No evidence of mitral stenosis.   4. The aortic valve is normal in structure. Aortic valve regurgitation is  not visualized. No aortic stenosis is present.   5. The inferior vena cava is normal in size with greater than 50%  respiratory variability, suggesting right atrial pressure of 3 mmHg.          Physical Exam:    Today's Vitals   10/06/23 1245 10/06/23 1255  BP: 127/86   Pulse: (!) 103   Resp: 17   Temp: 97.8 F (36.6 C)  TempSrc: Oral   SpO2: 98%   Weight: 112.5 kg   Height: 5' 7 (1.702 m)   PainSc:  0-No pain   Body mass index is 38.84 kg/m.  GEN: Well nourished, well developed in no acute distress NECK: No JVD CARDIAC: Normal rate, regular rhythm RESPIRATORY:  Clear to auscultation without rales, wheezing or rhonchi  ABDOMEN: Soft,  non-distended EXTREMITIES:  No edema; No deformity SKIN: Left suprasternal wound open, no erythema or active drainage. Left subxiphoid incision healing without active drainage or erythema. Left mid axillary incision healing without drainage or erythema.     ASSESSMENT AND PLAN:     #CIED infection: S-ICD with infection/drainage of lower sternal incision.  During explant lower incision and mid axillary incision appeared relatively clean.  However, suprasternal incision had purulent drainage upon opening.  Culture from suprasternal and subxiphoid incisions have grown MSSA.  Subxiphoid and mid axillary incisions were closed with interrupted Prolene sutures. She has had slow healing of her suprasternal and subxiphoid incisions. She has missed doses of oral antibiotics. Having trouble with adhering to outpatient IV antibiotic regimen. Difficulty keeping incisions clean and dry. She has itching and picks at wounds, places bandages over them to minimize this. Some ongoing drainage. No systemic infectious symptoms.  - I&D of the suprasternal and subxiphoid incisions to be performed today. - She will continue follow up with Infectious Disease for antibiotic management.  - Follow up in device clinic in 2 weeks.   Signed, Fonda Kitty, MD

## 2023-10-07 ENCOUNTER — Telehealth: Payer: Self-pay | Admitting: Physician Assistant

## 2023-10-07 ENCOUNTER — Emergency Department: Payer: MEDICAID

## 2023-10-07 LAB — COMPREHENSIVE METABOLIC PANEL WITH GFR
ALT: 21 U/L (ref 0–44)
AST: 21 U/L (ref 15–41)
Albumin: 4.2 g/dL (ref 3.5–5.0)
Alkaline Phosphatase: 78 U/L (ref 38–126)
Anion gap: 9 (ref 5–15)
BUN: 9 mg/dL (ref 6–20)
CO2: 17 mmol/L — ABNORMAL LOW (ref 22–32)
Calcium: 8.9 mg/dL (ref 8.9–10.3)
Chloride: 111 mmol/L (ref 98–111)
Creatinine, Ser: 0.45 mg/dL (ref 0.44–1.00)
GFR, Estimated: >60 mL/min (ref >60)
Glucose, Bld: 110 mg/dL — ABNORMAL HIGH (ref 70–99)
Potassium: 3.8 mmol/L (ref 3.5–5.1)
Sodium: 137 mmol/L (ref 135–145)
Total Bilirubin: 0.7 mg/dL (ref 0.0–1.2)
Total Protein: 7.2 g/dL (ref 6.5–8.1)

## 2023-10-07 LAB — CBC WITH DIFFERENTIAL/PLATELET
Abs Immature Granulocytes: 0.03 K/uL (ref 0.00–0.07)
Basophils Absolute: 0 K/uL (ref 0.0–0.1)
Basophils Relative: 0 %
Eosinophils Absolute: 0 K/uL (ref 0.0–0.5)
Eosinophils Relative: 0 %
HCT: 46.8 % — ABNORMAL HIGH (ref 36.0–46.0)
Hemoglobin: 15.5 g/dL — ABNORMAL HIGH (ref 12.0–15.0)
Immature Granulocytes: 0 %
Lymphocytes Relative: 16 %
Lymphs Abs: 1.6 K/uL (ref 0.7–4.0)
MCH: 30.5 pg (ref 26.0–34.0)
MCHC: 33.1 g/dL (ref 30.0–36.0)
MCV: 91.9 fL (ref 80.0–100.0)
Monocytes Absolute: 0.4 K/uL (ref 0.1–1.0)
Monocytes Relative: 4 %
Neutro Abs: 7.9 K/uL — ABNORMAL HIGH (ref 1.7–7.7)
Neutrophils Relative %: 80 %
Platelets: 281 K/uL (ref 150–400)
RBC: 5.09 MIL/uL (ref 3.87–5.11)
RDW: 14.5 % (ref 11.5–15.5)
WBC: 10 K/uL (ref 4.0–10.5)
nRBC: 0 % (ref 0.0–0.2)

## 2023-10-07 LAB — LACTIC ACID, PLASMA: Lactic Acid, Venous: 1.8 mmol/L (ref 0.5–1.9)

## 2023-10-07 LAB — TROPONIN I (HIGH SENSITIVITY): Troponin I (High Sensitivity): 3 ng/L (ref <18)

## 2023-10-07 MED ORDER — TRAMADOL HCL 50 MG PO TABS: 50.0000 mg | ORAL_TABLET | Freq: Three times a day (TID) | ORAL | 0 refills | Status: DC | PRN

## 2023-10-07 MED ORDER — IOHEXOL 300 MG/ML  SOLN
75.0000 mL | Freq: Once | INTRAMUSCULAR | Status: AC | PRN
  Administered 2023-10-07: 75 mL via INTRAVENOUS

## 2023-10-07 NOTE — Telephone Encounter (Signed)
 38 y.o. female with hx of CAD s/p CABG, ischemic cardiomyopathy, s/p Dewey ICD. ICD became infected and was extracted. She underwent pocket I&D yesterday. She has had continued pain and bleeding. She went to the [facility] ED last night and was given chlorhexidene to put on the wound and the wound was redressed. She called the answering service today b/c her wounds are still very painful. She had a large amount of blood come from her wound earlier. This has stopped. She was told to hold Clopidogrel  yesterday. She is still taking ASA. She is requesting Tramadol  refill.  Rx history reviewed via [place] [facility] [facility].  PLAN: - Refilled Tramadol  50 mg q 8 hours prn, #12, no refills - Advised pt to keep pressure dressing on wound and make sure it stays clean - Advised pt that if she has recurrent bleeding as she did earlier tonight, she should go back to the ED. Recommend she go to [facility]. - Will see if she can get a sooner appt at [facility] office next week (she prefers Tues).  Glendia Ferrier, PA-C    10/07/2023 7:04 PM

## 2023-10-07 NOTE — Discharge Instructions (Addendum)
 Please take the antibiotics as prescribed for your wound.  Please make sure to follow-up with cardiology for further management of your wound site.  Please make sure to also follow-up with the infectious disease doctor for further management of antibiotics.  The CT incidentally noted thyroid  nodule, please make sure to follow-up with your primary care doctor for further management of the nodule.

## 2023-10-07 NOTE — ED Notes (Signed)
 Per Dr. Waymond anterior sites cleaned with chlorahexadine and redressed with telfa guaze and paper tape.  D/c instructions reviewed with pt.  Pt will follow up with cariologist on Monday.

## 2023-10-07 NOTE — Anesthesia Postprocedure Evaluation (Signed)
 Anesthesia Post Note  Patient: Elizabeth Spencer  Procedure(s) Performed: IMPLANT POCKET INCISION & DRAINAGE     Patient location during evaluation: PACU Anesthesia Type: General Level of consciousness: awake and alert Pain management: pain level controlled Vital Signs Assessment: post-procedure vital signs reviewed and stable Respiratory status: spontaneous breathing, nonlabored ventilation, respiratory function stable and patient connected to nasal cannula oxygen Cardiovascular status: blood pressure returned to baseline and stable Postop Assessment: no apparent nausea or vomiting Anesthetic complications: no   There were no known notable events for this encounter.  Last Vitals:  Vitals:   10/06/23 1845 10/06/23 1900  BP: 108/68 107/67  Pulse: 70 78  Resp: (!) 21 20  Temp:    SpO2: 99% 97%    Last Pain:  Vitals:   10/06/23 1904  TempSrc:   PainSc: 10-Worst pain ever                 Elycia Woodside S

## 2023-10-07 NOTE — ED Notes (Signed)
 3 surgical wound sites redressed with telfa and paper tape. Sites are clean, no drainage or bleeding noted.

## 2023-10-08 ENCOUNTER — Encounter (HOSPITAL_COMMUNITY): Payer: Self-pay | Admitting: Cardiology

## 2023-10-10 ENCOUNTER — Telehealth: Payer: Self-pay | Admitting: Cardiology

## 2023-10-10 NOTE — Telephone Encounter (Signed)
 Pt c/o medication issue:  1. Name of Medication:  traMADol  (ULTRAM ) 50 MG tablet   2. How are you currently taking this medication (dosage and times per day)? As prescribed   3. Are you having a reaction (difficulty breathing--STAT)? No   4. What is your medication issue? On call provider only called in 12 tablets, but states Dr. Kennyth advised he would fill medication when they last spoke.   She also states she is out of the antibiotic that she is unsure of the name of, but states she was told to stay on it when she last spoke with Dr. Kennyth.   Please advise.

## 2023-10-11 LAB — AEROBIC/ANAEROBIC CULTURE W GRAM STAIN (SURGICAL/DEEP WOUND)
Culture: NO GROWTH
Culture: NO GROWTH
Gram Stain: NONE SEEN
Gram Stain: NONE SEEN

## 2023-10-11 MED ORDER — TRAMADOL HCL 50 MG PO TABS: 50.0000 mg | ORAL_TABLET | Freq: Three times a day (TID) | ORAL | 0 refills | Status: DC | PRN

## 2023-10-11 NOTE — Telephone Encounter (Signed)
Refill has been called in

## 2023-10-11 NOTE — Telephone Encounter (Addendum)
 Spoke with the patient who states that she only has 3 tramadol  left and would like to know if she can get some more. She reports that she was unable to sleep due to the pain last night. She reports that her wounds are draining which I advised was expected. Encouraged patient to keep wounds covered. She has an appointment scheduled with wound clinic tomorrow. She is aware to keep that appointment and follow their instructions for wound care.

## 2023-10-11 NOTE — Addendum Note (Signed)
 Addended by: CHAUVIGNE, Tanyika Barros on: 10/11/2023 05:28 PM   Modules accepted: Orders

## 2023-10-11 NOTE — Telephone Encounter (Signed)
Pt is returning call from nurse.

## 2023-10-11 NOTE — Telephone Encounter (Signed)
 Attempted to call patient. Unable to leave voicemail.

## 2023-10-12 ENCOUNTER — Encounter: Payer: MEDICAID | Attending: Physician Assistant | Admitting: Physician Assistant

## 2023-10-12 DIAGNOSIS — G4733 Obstructive sleep apnea (adult) (pediatric): Secondary | ICD-10-CM | POA: Insufficient documentation

## 2023-10-12 DIAGNOSIS — L98492 Non-pressure chronic ulcer of skin of other sites with fat layer exposed: Secondary | ICD-10-CM | POA: Diagnosis not present

## 2023-10-12 DIAGNOSIS — Z86718 Personal history of other venous thrombosis and embolism: Secondary | ICD-10-CM | POA: Insufficient documentation

## 2023-10-12 DIAGNOSIS — I5022 Chronic systolic (congestive) heart failure: Secondary | ICD-10-CM | POA: Insufficient documentation

## 2023-10-12 DIAGNOSIS — X58XXXA Exposure to other specified factors, initial encounter: Secondary | ICD-10-CM | POA: Insufficient documentation

## 2023-10-12 DIAGNOSIS — F3189 Other bipolar disorder: Secondary | ICD-10-CM | POA: Diagnosis not present

## 2023-10-12 DIAGNOSIS — I2510 Atherosclerotic heart disease of native coronary artery without angina pectoris: Secondary | ICD-10-CM | POA: Diagnosis not present

## 2023-10-12 DIAGNOSIS — T8131XA Disruption of external operation (surgical) wound, not elsewhere classified, initial encounter: Secondary | ICD-10-CM | POA: Diagnosis present

## 2023-10-12 LAB — CULTURE, BLOOD (ROUTINE X 2)
Culture: NO GROWTH
Culture: NO GROWTH

## 2023-10-13 ENCOUNTER — Telehealth: Payer: Self-pay | Admitting: Cardiology

## 2023-10-13 MED FILL — Bupivacaine HCl Preservative Free (PF) Inj 0.25%: INTRAMUSCULAR | Qty: 30 | Status: AC

## 2023-10-13 NOTE — Telephone Encounter (Signed)
 Dr. Kennyth has discussed patient's case with Andre Dustman, PA.

## 2023-10-13 NOTE — Telephone Encounter (Signed)
 New message:    Andre Dustman, PA, would like to Dr Kennyth about this patient please.

## 2023-10-17 ENCOUNTER — Telehealth: Payer: Self-pay | Admitting: Cardiology

## 2023-10-17 ENCOUNTER — Encounter: Payer: MEDICAID | Admitting: Physician Assistant

## 2023-10-17 DIAGNOSIS — T8131XA Disruption of external operation (surgical) wound, not elsewhere classified, initial encounter: Secondary | ICD-10-CM | POA: Diagnosis not present

## 2023-10-17 NOTE — Telephone Encounter (Signed)
 Returned patient's call. Patient reports that she developed new blisters on and around incision sites after her wound care visit 10/12/23. She states they are very painful and she applied over-the-counter antibiotic ointment with lidocaine  to try and decrease pain level with no success. She reports she did this even though she was not advised by any of her providers to do this. Advised patient to call wound clinic and see if they can get her in, patient states she is very unhappy with her care at wound clare clinic in [place] and does not want to go there. Advised ED would be next best option. Patient states she will call wound care clinic and if she is not able to see a different provider she will go to ED.   Patient has visit with S. Riddle on 10/19/23.

## 2023-10-17 NOTE — Telephone Encounter (Signed)
 Patient stated her stomach has been hurting since her Wound Care visit.  Patient stated she put wound solution on her stomach but she has developed blisters and wants advice on next steps.  Patient noted she has also put antibiotic ointment on her stomach which has helped a little.

## 2023-10-18 ENCOUNTER — Telehealth: Payer: Self-pay

## 2023-10-18 NOTE — Telephone Encounter (Signed)
 Pt requesting a home nurse told pt to get referral from wound care.

## 2023-10-19 ENCOUNTER — Ambulatory Visit: Payer: MEDICAID | Attending: Cardiology | Admitting: Cardiology

## 2023-10-19 ENCOUNTER — Encounter: Payer: Self-pay | Admitting: Cardiology

## 2023-10-19 VITALS — BP 112/80 | HR 103 | Ht 67.0 in | Wt 254.8 lb

## 2023-10-19 DIAGNOSIS — T827XXD Infection and inflammatory reaction due to other cardiac and vascular devices, implants and grafts, subsequent encounter: Secondary | ICD-10-CM

## 2023-10-19 NOTE — Patient Instructions (Signed)
 Medication Instructions:   Your physician recommends that you continue on your current medications as directed. Please refer to the Current Medication list given to you today.   *If you need a refill on your cardiac medications before your next appointment, please call your pharmacy*  Lab Work: No labs ordered today  If you have labs (blood work) drawn today and your tests are completely normal, you will receive your results only by: MyChart Message (if you have MyChart) OR A paper copy in the mail If you have any lab test that is abnormal or we need to change your treatment, we will call you to review the results.  Testing/Procedures: No test ordered today   Follow-Up: At [facility], you and your health needs are our priority.  As part of our continuing mission to provide you with exceptional heart care, our providers are all part of one team.  This team includes your primary Cardiologist (physician) and Advanced Practice Providers or APPs (Physician Assistants and Nurse Practitioners) who all work together to provide you with the care you need, when you need it.  Your next appointment:    October 25, 2023 at 2:00 pm  Provider:   Dr. Kennyth    We recommend signing up for the patient portal called MyChart.  Sign up information is provided on this After Visit Summary.  MyChart is used to connect with patients for Virtual Visits (Telemedicine).  Patients are able to view lab/test results, encounter notes, upcoming appointments, etc.  Non-urgent messages can be sent to your provider as well.   To learn more about what you can do with MyChart, go to [email or web address].

## 2023-10-19 NOTE — Progress Notes (Signed)
 Wound check appointment.   L flank, proximal sternal chest, and distal sternal areas all have sutures in place.   L flank sutures removed easily - site appears c/d/I with minimal scabbing.  Proximal sternal incision is open underneath sutures. Serosanginous drainage present on gauze. Sutures removed and clean gauze applied. This wound is at least 2cm deep. Removing these sutures was very painful to patient. She requested that I not remove distal sternal sutures given her pain.   She has follow-up scheduled with Dr. Kennyth in 5 days, will removal distal sternal sutures at that time.   She is frustrated with wound care clinic who have stated that her insurance will not cover her wound supplies. She called her insurance company who stated they needed a referral and they would cover supplies. I encouraged her to discuss this with wound clinic as they know the specific supplies and quantities she will need.  Further, she is very anxious about needing to pack her wounds in the future.   Encouraged her to discuss her concerns about wound care with wound clinic.  Encouraged her to use feminine maxi pads to assist with drainage, as previously recommended by wound clinic.

## 2023-10-23 ENCOUNTER — Encounter: Payer: Self-pay | Admitting: Family

## 2023-10-23 NOTE — Assessment & Plan Note (Signed)
 Checking labs today.  Will continue supplements as needed.   - Vitamin D  - Vitamin B12 - TSH

## 2023-10-23 NOTE — Assessment & Plan Note (Signed)
 I have referred pt to endocrine, but she does not want to continue seeing Dr Evangelina, She has been having continued issues with her thyroid . No other concerns today.

## 2023-10-24 ENCOUNTER — Other Ambulatory Visit: Payer: Self-pay | Admitting: Family

## 2023-10-25 ENCOUNTER — Encounter: Payer: Self-pay | Admitting: Cardiology

## 2023-10-25 ENCOUNTER — Other Ambulatory Visit: Payer: Self-pay | Admitting: Family

## 2023-10-25 ENCOUNTER — Ambulatory Visit: Payer: MEDICAID | Attending: Cardiology | Admitting: Cardiology

## 2023-10-25 VITALS — BP 130/77 | HR 100 | Ht 67.0 in | Wt 252.0 lb

## 2023-10-25 DIAGNOSIS — Z951 Presence of aortocoronary bypass graft: Secondary | ICD-10-CM

## 2023-10-25 DIAGNOSIS — I255 Ischemic cardiomyopathy: Secondary | ICD-10-CM | POA: Diagnosis not present

## 2023-10-25 DIAGNOSIS — I5022 Chronic systolic (congestive) heart failure: Secondary | ICD-10-CM | POA: Diagnosis not present

## 2023-10-25 DIAGNOSIS — T827XXD Infection and inflammatory reaction due to other cardiac and vascular devices, implants and grafts, subsequent encounter: Secondary | ICD-10-CM | POA: Diagnosis not present

## 2023-10-25 MED ORDER — TRAMADOL HCL 75 MG PO TABS: 75.0000 mg | ORAL_TABLET | Freq: Three times a day (TID) | ORAL | 0 refills | Status: DC | PRN

## 2023-10-25 NOTE — Progress Notes (Signed)
 " Electrophysiology Office Note:   Date:  10/26/2023  ID:  Elizabeth Spencer, DOB Jun 17, 1985, MRN [id number]  Primary Cardiologist: Lonni Hanson, MD Electrophysiologist: Fonda Kitty, MD      History of Present Illness:   Elizabeth Spencer is a 38 y.o. 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 She is being seen today for wound check.  Discussed the use of AI scribe software for clinical note transcription with the patient, who gave verbal consent to proceed.  History of Present Illness Elizabeth Spencer is a 38 year old female who presents with concerns regarding wound healing and management following explant of her infected subcutaneous ICD.  She feels overwhelmed trying to care for her wounds.  She has seen wound care clinic once.  She feels like she needs additional assistance at home.  She states that wounds are no longer draining.  Her skin is very sensitive which complicates things and she has a prior history of psoriasis.  This often resulted in itching around the wounds and associated with dressing changes.  She denies any systemic infectious symptoms.  Review of systems complete and found to be negative unless listed in HPI.   EP Information / Studies Reviewed:    EKG is not ordered today. EKG from 09/01/23 reviewed which showed sinus tach.      Echo 02/24/23:  1. Left ventricular ejection fraction, by estimation, is 30 to 35%. The  left ventricle has moderately decreased function. The left ventricle  demonstrates global hypokinesis. Left ventricular diastolic parameters are  consistent with Grade I diastolic  dysfunction  (impaired relaxation). The average left ventricular global  longitudinal strain is -9.4 %.   2. Right ventricular systolic function is normal. The right ventricular  size is normal. Tricuspid regurgitation signal is inadequate for assessing  PA pressure.   3. The mitral valve is normal in structure. Moderate mitral valve  regurgitation. No evidence of mitral stenosis.   4. The aortic valve is normal in structure. Aortic valve regurgitation is  not visualized. No aortic stenosis is present.   5. The inferior vena cava is normal in size with greater than 50%  respiratory variability, suggesting right atrial pressure of 3 mmHg.         Physical Exam:   VS:  BP 130/77 (BP Location: Left Arm, Patient Position: Sitting, Cuff Size: Normal)   Pulse 100   Ht 5' 7 (1.702 m)   Wt 252 lb (114.3 kg)   LMP 08/22/2017 Comment: says she just spots.  SpO2 98%   BMI 39.47 kg/m    Wt Readings from Last 3 Encounters:  10/25/23 252 lb (114.3 kg)  10/19/23 254 lb 12.8 oz (115.6 kg)  10/06/23 248 lb (112.5 kg)     GEN: Well nourished, well developed in no acute distress NECK: No JVD CARDIAC: Normal rate, regular rhythm RESPIRATORY:  Clear to auscultation without rales, wheezing or rhonchi  ABDOMEN: Soft, non-distended EXTREMITIES:  No edema; No deformity SKIN: Left suprasternal wound open, no erythema or drainage. Left subxiphoid incision healing without drainage or erythema, stitches in place. Left mid axillary incision well healed without drainage or erythema.    ASSESSMENT AND PLAN:    #  CIED infection: S-ICD with infection/drainage of lower sternal incision.  During explant lower incision and mid axillary incision appeared relatively clean.  However, suprasternal incision had purulent drainage upon opening.  Culture from suprasternal and subxiphoid incisions have grown MSSA.  She had difficulty with hygiene and compliance with antibiotic therapy so underwent washout and additional debridement on  10/06/2023 of the suprasternal and subxiphoid pockets.  These did not appear obviously infected during the procedure.  Tissue was sent off for culture and had no growth.  She has since establish care with wound care clinic in [place] to assist with prolonged wound healing and management.  Recent swab of her suprasternal incision was sent and again showed no growth.  All blood work that has been done since initial explant has shown no evidence of infection.  - She is struggling with chronic wound management which she feels is overwhelming for her.  I am hoping that the [facility] wound care clinic can assist with this.  She feels like she needs supplies and home nursing assistance.  We defer all management to the wound care clinic moving forward. - I have renewed her tramadol  for pain management associated with dressing changes for another 2 weeks. - There are no immediate plans for ICD reimplantation.  We would need all wounds to be healed prior to implantation of a transvenous device.  She is not interested in bridging with a wearable defibrillator.  I believe this is reasonable given that her indication is primary prevention.  Wound care clinic can remove remaining sutures at any time, as they feel are necessary to assist with healing.  #.  Chronic systolic heart failure: EF remains less than 35% greater than 90 days after revascularization despite optimal medical therapy. NYHA II symptoms at baseline. Well compensated.  #.  Ischemic cardiomyopathy: -Continue GDMT - Farxiga  10 mg once daily, metoprolol  XL 25 mg once daily, spironolactone  25 mg once daily - and follow-up with primary cardiologist. -Lasix  20mg  as needed.  -Follow up with HF team.   #.  CAD status post 1vCABG (LIMA-LAD): -Continue aspirin  81mg  once daily, clopidogrel  75mg  once daily, atorvastatin  40mg  once daily.  Follow-up for now with wound care clinic and heart failure team.  Once wounds have healed, we will reevaluate primary prevention  ICD with transvenous device.  Signed, Fonda Kitty, MD  "

## 2023-10-25 NOTE — Patient Instructions (Signed)
 Medication Instructions:  Your physician has recommended you make the following change in your medication: TAKE Tramadol  75 mg every 8 hours as needed for pain  *If you need a refill on your cardiac medications before your next appointment, please call your pharmacy*  Lab Work: None ordered  If you have any lab test that is abnormal or we need to change your treatment, we will call you to review the results.  Testing/Procedures: None ordered  Follow-Up: At [facility], you and your health needs are our priority.  As part of our continuing mission to provide you with exceptional heart care, our providers are all part of one team.  This team includes your primary Cardiologist (physician) and Advanced Practice Providers or APPs (Physician Assistants and Nurse Practitioners) who all work together to provide you with the care you need, when you need it.  Your next appointment:   as needed  Provider:   Fonda Kitty, MD    Thank you for choosing [facility]!!

## 2023-10-26 ENCOUNTER — Ambulatory Visit: Payer: MEDICAID | Admitting: Physician Assistant

## 2023-10-30 ENCOUNTER — Encounter: Payer: Self-pay | Admitting: Family

## 2023-10-30 NOTE — Progress Notes (Signed)
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 Ref Range   Specimen Description WOUND    Special Requests SUBSTERNAL    Gram Stain      FEW WBC PRESENT, PREDOMINANTLY PMN RARE GRAM POSITIVE COCCI IN PAIRS CRITICAL RESULT CALLED TO, READ BACK BY AND VERIFIED WITH: DR. KENNYTH [id number] @ 1807 FH    Culture      FEW STAPHYLOCOCCUS AUREUS NO ANAEROBES ISOLATED Performed at [facility], [address], [place], [place] [address]    Report Status  09/10/2023 FINAL    Organism ID, Bacteria STAPHYLOCOCCUS AUREUS       Susceptibility   Staphylococcus aureus - MIC*    CIPROFLOXACIN  <=0.5 SENSITIVE Sensitive     ERYTHROMYCIN  <=0.25 SENSITIVE Sensitive     GENTAMICIN  <=0.5 SENSITIVE Sensitive     OXACILLIN 0.5 SENSITIVE Sensitive     TETRACYCLINE <=1 SENSITIVE Sensitive     VANCOMYCIN  1 SENSITIVE Sensitive     TRIMETH /SULFA  <=10 SENSITIVE Sensitive     CLINDAMYCIN  <=0.25 SENSITIVE Sensitive     RIFAMPIN <=0.5 SENSITIVE Sensitive     Inducible Clindamycin  NEGATIVE Sensitive     LINEZOLID  2 SENSITIVE Sensitive     * FEW STAPHYLOCOCCUS AUREUS  Aerobic Culture w Gram Stain (superficial specimen)     Status: None   Collection Time: 09/05/23  4:36 PM   Specimen: Wound  Result Value Ref Range   Specimen Description WOUND  Special Requests SUBXIPHOID    Gram Stain      RARE WBC PRESENT, PREDOMINANTLY PMN NO ORGANISMS SEEN Performed at [facility], [address], [place], [place] [address]    Culture FEW STAPHYLOCOCCUS AUREUS    Report Status 09/08/2023 FINAL    Organism ID, Bacteria STAPHYLOCOCCUS AUREUS       Susceptibility   Staphylococcus aureus - MIC*    CIPROFLOXACIN  <=0.5 SENSITIVE Sensitive     ERYTHROMYCIN  <=0.25 SENSITIVE Sensitive     GENTAMICIN  <=0.5 SENSITIVE Sensitive     OXACILLIN 0.5 SENSITIVE Sensitive     TETRACYCLINE <=1 SENSITIVE Sensitive     VANCOMYCIN  1 SENSITIVE Sensitive     TRIMETH /SULFA  <=10 SENSITIVE Sensitive     CLINDAMYCIN  <=0.25 SENSITIVE Sensitive     RIFAMPIN <=0.5 SENSITIVE Sensitive     Inducible Clindamycin  NEGATIVE Sensitive     LINEZOLID  2 SENSITIVE Sensitive     * FEW STAPHYLOCOCCUS AUREUS  Aerobic/Anaerobic Culture w Gram Stain (surgical/deep wound)     Status: None   Collection Time: 09/05/23  4:56 PM   Specimen: Wound  Result Value Ref Range   Specimen Description WOUND    Special Requests DEVICE    Gram Stain NO WBC SEEN NO ORGANISMS SEEN     Culture      NO GROWTH 5  DAYS NO ANAEROBES ISOLATED; CULTURE IN PROGRESS FOR 5 DAYS Performed at [facility], [address] [place], [place] [address]    Report Status 09/10/2023 FINAL   Basic metabolic panel with GFR     Status: Abnormal   Collection Time: 09/19/23 10:10 AM  Result Value Ref Range   Glucose, Bld 84 65 - 99 mg/dL    Comment: .            Fasting reference interval .    BUN 14 7 - 25 mg/dL   Creat 9.24 9.49 - 9.00 mg/dL   eGFR 896 > OR = 60 fO/fpw/8.26f7   BUN/Creatinine Ratio SEE NOTE: 6 - 22 (calc)    Comment:    Not Reported: BUN and Creatinine are within    reference range. .    Sodium 139 135 - 146 mmol/L   Potassium 4.2 3.5 - 5.3 mmol/L   Chloride 108 98 - 110 mmol/L   CO2 19 (L) 20 - 32 mmol/L   Calcium  9.2 8.6 - 10.2 mg/dL  CBC with Differential/Platelet     Status: Abnormal   Collection Time: 09/19/23 10:10 AM  Result Value Ref Range   WBC 10.1 3.8 - 10.8 Thousand/uL   RBC 5.19 (H) 3.80 - 5.10 Million/uL   Hemoglobin 15.2 11.7 - 15.5 g/dL   HCT 51.8 (H) 64.9 - 54.9 %   MCV 92.7 80.0 - 100.0 fL   MCH 29.3 27.0 - 33.0 pg   MCHC 31.6 (L) 32.0 - 36.0 g/dL    Comment: For adults, a slight decrease in the calculated MCHC value (in the range of 30 to 32 g/dL) is most likely not clinically significant; however, it should be interpreted with caution in correlation with other red cell parameters and the patient's clinical condition.    RDW 13.0 11.0 - 15.0 %   Platelets 294 140 - 400 Thousand/uL   MPV 10.0 7.5 - 12.5 fL   Neutro Abs 6,100 1,500 - 7,800 cells/uL   Absolute Lymphocytes 2,879 850 - 3,900 cells/uL   Absolute Monocytes 697 200 - 950 cells/uL   Eosinophils Absolute 283 15 -  500 cells/uL   Basophils Absolute 141 0 - 200 cells/uL   Neutrophils Relative % 60.4 %   Total Lymphocyte 28.5 %   Monocytes Relative 6.9 %   Eosinophils Relative 2.8 %   Basophils Relative 1.4 %  C-reactive protein     Status: None   Collection Time: 09/19/23 10:10 AM  Result  Value Ref Range   CRP <3.0 <8.0 mg/L  Sedimentation rate     Status: None   Collection Time: 09/19/23 10:10 AM  Result Value Ref Range   Sed Rate 2 0 - 20 mm/h  Aerobic/Anaerobic Culture w Gram Stain (surgical/deep wound)     Status: None   Collection Time: 10/06/23  5:56 PM   Specimen: Tissue; Wound  Result Value Ref Range   Specimen Description TISSUE CHEST    Special Requests SUPRA STERNAL    Gram Stain NO WBC SEEN NO ORGANISMS SEEN     Culture      No growth aerobically or anaerobically. Performed at [facility], [address], [place], [place] [address]    Report Status 10/11/2023 FINAL   Aerobic/Anaerobic Culture w Gram Stain (surgical/deep wound)     Status: None   Collection Time: 10/06/23  5:56 PM   Specimen: Tissue; Wound  Result Value Ref Range   Specimen Description TISSUE CHEST    Special Requests SUBXIPHOID SAMPLE B    Gram Stain NO WBC SEEN NO ORGANISMS SEEN     Culture      No growth aerobically or anaerobically. Performed at [facility], [address], [place], [place] [address]    Report Status 10/11/2023 FINAL   CBC with Differential     Status: Abnormal   Collection Time: 10/07/23  2:16 AM  Result Value Ref Range   WBC 10.0 4.0 - 10.5 K/uL   RBC 5.09 3.87 - 5.11 MIL/uL   Hemoglobin 15.5 (H) 12.0 - 15.0 g/dL   HCT 53.1 (H) 63.9 - 53.9 %   MCV 91.9 80.0 - 100.0 fL   MCH 30.5 26.0 - 34.0 pg   MCHC 33.1 30.0 - 36.0 g/dL   RDW 85.4 88.4 - 84.4 %   Platelets 281 150 - 400 K/uL   nRBC 0.0 0.0 - 0.2 %   Neutrophils Relative % 80 %   Neutro Abs 7.9 (H) 1.7 - 7.7 K/uL   Lymphocytes Relative 16 %   Lymphs Abs 1.6 0.7 - 4.0 K/uL   Monocytes Relative 4 %   Monocytes Absolute 0.4 0.1 - 1.0 K/uL   Eosinophils Relative 0 %   Eosinophils Absolute 0.0 0.0 - 0.5 K/uL   Basophils Relative 0 %   Basophils Absolute 0.0 0.0 - 0.1 K/uL   Immature Granulocytes 0 %   Abs Immature Granulocytes 0.03 0.00 - 0.07 K/uL    Comment: Performed at [facility], [address] [place], [place] [address]  Lactic acid, plasma     Status: None   Collection Time: 10/07/23  2:16 AM  Result Value Ref Range   Lactic Acid, Venous 1.8 0.5 - 1.9 mmol/L    Comment: Performed at [facility], [address] [place], [place] [address]  Troponin I (High Sensitivity)     Status: None   Collection Time: 10/07/23  2:16 AM  Result Value Ref Range   Troponin I (High Sensitivity) 3 <18 ng/L    Comment: (NOTE) Elevated high sensitivity troponin I (hsTnI) values and significant  changes across serial  measurements may suggest ACS but many other  chronic and acute conditions are known to elevate hsTnI results.  Refer to the Links section for chest pain algorithms and additional  guidance. Performed at [facility], [address] [place], [place] [address]   Culture, blood (routine x 2)     Status: None   Collection Time: 10/07/23  2:16 AM   Specimen: BLOOD RIGHT ARM  Result Value Ref Range   Specimen Description BLOOD RIGHT ARM    Special Requests      BOTTLES DRAWN AEROBIC AND ANAEROBIC Blood Culture results may not be optimal due to an inadequate volume of blood received in culture bottles   Culture      NO GROWTH 5 DAYS Performed at [facility], [address] [place], [place] [address]    Report Status 10/12/2023 FINAL   Culture, blood (routine x 2)     Status: None   Collection Time: 10/07/23  2:16 AM   Specimen: BLOOD RIGHT ARM  Result Value Ref Range   Specimen Description BLOOD RIGHT ARM    Special Requests      BOTTLES DRAWN AEROBIC AND ANAEROBIC Blood Culture results may not be optimal due to an inadequate volume of blood received in culture bottles   Culture      NO GROWTH 5 DAYS Performed at [facility], [address], [place], [place] [address]    Report Status 10/12/2023 FINAL   Comprehensive metabolic panel with GFR     Status: Abnormal   Collection Time: 10/07/23  3:07 AM   Result Value Ref Range   Sodium 137 135 - 145 mmol/L   Potassium 3.8 3.5 - 5.1 mmol/L   Chloride 111 98 - 111 mmol/L   CO2 17 (L) 22 - 32 mmol/L   Glucose, Bld 110 (H) 70 - 99 mg/dL    Comment: Glucose reference range applies only to samples taken after fasting for at least 8 hours.   BUN 9 6 - 20 mg/dL   Creatinine, Ser 9.54 0.44 - 1.00 mg/dL   Calcium  8.9 8.9 - 10.3 mg/dL   Total Protein 7.2 6.5 - 8.1 g/dL   Albumin  4.2 3.5 - 5.0 g/dL   AST 21 15 - 41 U/L   ALT 21 0 - 44 U/L   Alkaline Phosphatase 78 38 - 126 U/L   Total Bilirubin 0.7 0.0 - 1.2 mg/dL   GFR, Estimated >39 >39 mL/min    Comment: (NOTE) Calculated using the CKD-EPI Creatinine Equation (2021)    Anion gap 9 5 - 15    Comment: Performed at [facility], [address] [place], [place] [address]       Assessment & Plan Migraine without aura and without status migrainosus, not intractable Patient given samples of Ubrelvy  and Nurtec.  Given her heart issues, she cannot take any other medications for her migraines.   She will let me know which is more effective for her.  Obstructive sleep apnea Sleep study ordered and scheduled.  Will contact with results once they are available and set up follow up appt.   Bipolar disorder, in partial remission, most recent episode manic [facility]) Patient is seen by Psych, who manage this condition.  She is well controlled with current therapy.   Will defer to them for further changes to plan of care.   Hyperthyroidism Patient is seen by endocrinology, who manage this condition.  She is well controlled with current therapy.   Will  defer to them for further changes to plan of care.     Return in about 1 month (around 08/17/2023) for F/U.   Total time spent: 20 minutes  ALAN CHRISTELLA ARRANT, FNP  07/18/2023   This document may have been prepared by [facility] Voice Recognition software and as such may include unintentional dictation errors.

## 2023-10-30 NOTE — Assessment & Plan Note (Signed)
Patient is seen by endocrinology, who manage this condition.  She is well controlled with current therapy.   Will defer to them for further changes to plan of care.

## 2023-10-30 NOTE — Assessment & Plan Note (Signed)
 Patient is seen by Psych, who manage this condition.  She is well controlled with current therapy.   Will defer to them for further changes to plan of care.

## 2023-10-30 NOTE — Assessment & Plan Note (Signed)
 Patient given samples of Ubrelvy  and Nurtec.  Given her heart issues, she cannot take any other medications for her migraines.   She will let me know which is more effective for her.

## 2023-10-30 NOTE — Assessment & Plan Note (Signed)
 Sleep study ordered and scheduled.  Will contact with results once they are available and set up follow up appt.

## 2023-10-31 ENCOUNTER — Ambulatory Visit: Payer: MEDICAID | Admitting: Physician Assistant

## 2023-11-01 ENCOUNTER — Encounter: Payer: MEDICAID | Admitting: Physician Assistant

## 2023-11-01 DIAGNOSIS — T8131XA Disruption of external operation (surgical) wound, not elsewhere classified, initial encounter: Secondary | ICD-10-CM | POA: Diagnosis not present

## 2023-11-02 ENCOUNTER — Emergency Department: Payer: MEDICAID

## 2023-11-02 ENCOUNTER — Other Ambulatory Visit: Payer: Self-pay

## 2023-11-02 DIAGNOSIS — J45909 Unspecified asthma, uncomplicated: Secondary | ICD-10-CM | POA: Insufficient documentation

## 2023-11-02 DIAGNOSIS — I509 Heart failure, unspecified: Secondary | ICD-10-CM | POA: Insufficient documentation

## 2023-11-02 DIAGNOSIS — Z95810 Presence of automatic (implantable) cardiac defibrillator: Secondary | ICD-10-CM | POA: Insufficient documentation

## 2023-11-02 DIAGNOSIS — N83202 Unspecified ovarian cyst, left side: Secondary | ICD-10-CM | POA: Diagnosis not present

## 2023-11-02 DIAGNOSIS — Z7982 Long term (current) use of aspirin: Secondary | ICD-10-CM | POA: Insufficient documentation

## 2023-11-02 DIAGNOSIS — I2510 Atherosclerotic heart disease of native coronary artery without angina pectoris: Secondary | ICD-10-CM | POA: Insufficient documentation

## 2023-11-02 DIAGNOSIS — Z955 Presence of coronary angioplasty implant and graft: Secondary | ICD-10-CM | POA: Diagnosis not present

## 2023-11-02 DIAGNOSIS — R1031 Right lower quadrant pain: Secondary | ICD-10-CM | POA: Diagnosis present

## 2023-11-02 LAB — COMPREHENSIVE METABOLIC PANEL WITH GFR
ALT: 16 U/L (ref 0–44)
AST: 18 U/L (ref 15–41)
Albumin: 3.9 g/dL (ref 3.5–5.0)
Alkaline Phosphatase: 77 U/L (ref 38–126)
Anion gap: 9 (ref 5–15)
BUN: 8 mg/dL (ref 6–20)
CO2: 21 mmol/L — ABNORMAL LOW (ref 22–32)
Calcium: 9.2 mg/dL (ref 8.9–10.3)
Chloride: 107 mmol/L (ref 98–111)
Creatinine, Ser: 0.66 mg/dL (ref 0.44–1.00)
GFR, Estimated: >60 mL/min (ref >60)
Glucose, Bld: 109 mg/dL — ABNORMAL HIGH (ref 70–99)
Potassium: 3.3 mmol/L — ABNORMAL LOW (ref 3.5–5.1)
Sodium: 137 mmol/L (ref 135–145)
Total Bilirubin: 0.5 mg/dL (ref 0.0–1.2)
Total Protein: 6.9 g/dL (ref 6.5–8.1)

## 2023-11-02 LAB — CBC WITH DIFFERENTIAL/PLATELET
Abs Immature Granulocytes: 0.03 K/uL (ref 0.00–0.07)
Basophils Absolute: 0.1 K/uL (ref 0.0–0.1)
Basophils Relative: 1 %
Eosinophils Absolute: 0.3 K/uL (ref 0.0–0.5)
Eosinophils Relative: 3 %
HCT: 46.9 % — ABNORMAL HIGH (ref 36.0–46.0)
Hemoglobin: 15.7 g/dL — ABNORMAL HIGH (ref 12.0–15.0)
Immature Granulocytes: 0 %
Lymphocytes Relative: 35 %
Lymphs Abs: 3.6 K/uL (ref 0.7–4.0)
MCH: 30.3 pg (ref 26.0–34.0)
MCHC: 33.5 g/dL (ref 30.0–36.0)
MCV: 90.4 fL (ref 80.0–100.0)
Monocytes Absolute: 0.9 K/uL (ref 0.1–1.0)
Monocytes Relative: 9 %
Neutro Abs: 5.4 K/uL (ref 1.7–7.7)
Neutrophils Relative %: 52 %
Platelets: 288 K/uL (ref 150–400)
RBC: 5.19 MIL/uL — ABNORMAL HIGH (ref 3.87–5.11)
RDW: 13.7 % (ref 11.5–15.5)
WBC: 10.4 K/uL (ref 4.0–10.5)
nRBC: 0 % (ref 0.0–0.2)

## 2023-11-02 NOTE — ED Triage Notes (Signed)
 Pt reports she is having right flank pain that began a few days ago, pt was given abx for UTI a few weeks ago but didn't take them and it went away but began having dysuria 1 week ago. Pt also reports some nausea.

## 2023-11-03 ENCOUNTER — Encounter: Payer: Self-pay | Admitting: Radiology

## 2023-11-03 ENCOUNTER — Emergency Department: Payer: MEDICAID

## 2023-11-03 ENCOUNTER — Emergency Department
Admission: EM | Admit: 2023-11-03 | Discharge: 2023-11-03 | Disposition: A | Discharge status: Home / Self Care | Payer: MEDICAID | Attending: Emergency Medicine | Admitting: Emergency Medicine

## 2023-11-03 DIAGNOSIS — N83202 Unspecified ovarian cyst, left side: Secondary | ICD-10-CM

## 2023-11-03 DIAGNOSIS — R1031 Right lower quadrant pain: Secondary | ICD-10-CM

## 2023-11-03 LAB — URINALYSIS, ROUTINE W REFLEX MICROSCOPIC
Bilirubin Urine: NEGATIVE
Glucose, UA: >=500 mg/dL — AB
Hgb urine dipstick: NEGATIVE
Ketones, ur: NEGATIVE mg/dL
Leukocytes,Ua: NEGATIVE
Nitrite: NEGATIVE
Protein, ur: NEGATIVE mg/dL
Specific Gravity, Urine: 1.028 (ref 1.005–1.030)
pH: 5 (ref 5.0–8.0)

## 2023-11-03 MED ORDER — HYDROCODONE-ACETAMINOPHEN 5-325 MG PO TABS
2.0000 | ORAL_TABLET | Freq: Once | ORAL | Status: AC
  Administered 2023-11-03: 2 via ORAL
  Filled 2023-11-03: qty 2

## 2023-11-03 MED ORDER — ONDANSETRON 4 MG PO TBDP
4.0000 mg | ORAL_TABLET | Freq: Once | ORAL | Status: AC
  Administered 2023-11-03: 4 mg via ORAL
  Filled 2023-11-03: qty 1

## 2023-11-03 MED ORDER — ONDANSETRON 4 MG PO TBDP: 4.0000 mg | ORAL_TABLET | Freq: Four times a day (QID) | ORAL | 0 refills | Status: AC | PRN

## 2023-11-03 NOTE — ED Notes (Signed)
 Patient given discharge instructions including prescriptions x1 and importance of follow up with OB/GYN for cystic lesion with stated understanding. Patient stable and ambulatory with steady even gait on dispo.

## 2023-11-03 NOTE — ED Notes (Signed)
 Ward, MD at bedside to discuss results with patient. VSS, call light within reach.

## 2023-11-03 NOTE — ED Provider Notes (Signed)
 [facility] Provider Note    Event Date/Time   First MD Initiated Contact with Patient 11/03/23 [phone]     (approximate)   History   Flank Pain   HPI  Elizabeth Spencer is a 38 y.o. female with history of bipolar disorder, migraines, chronic back pain, hyperlipidemia, cocaine use, CAD status post CABG, ischemic cardiomyopathy and heart failure status post ICD that subsequently had to be removed secondary to soft tissue infection who presents to the emergency department with complaints of 2 weeks of right lower quadrant abdominal pain, nausea and vomiting.  Denies diarrhea.  Reports she did have dysuria that started 2 days ago and she started a prescription of Macrobid  that was previously provided to her for a UTI that she never started.  She has had previous hysterectomy and bilateral salpingectomy, C-section x 3, cholecystectomy.  She denies any vaginal bleeding, discharge.     History provided by patient.    Past Medical History:  Diagnosis Date   Aneurysm (HCC) 01/04/2014   Arthritis    Asthma    WELL CONTROLLED   Brain aneurysm 2007   NEUROLOGY NOTE DOES NOT MENTION ANEURYSM BUT PT STATES SHE DID NOT HAVE TO HAVE SURGERY   Complication of anesthesia    FOR 1 C-SECTION PT WAS ITCHING AND VERY RED ON HER FACE   Cough, persistent 01/27/2016   Dermatitis due to sunburn 11/08/2013   Family history of adverse reaction to anesthesia    brother, neice and nephew got red in face with hives   Gallstones    GERD (gastroesophageal reflux disease)    Gonorrhea 06/20/2020   Headache    CHRONIC HEADACHES   History of chronic cough    DRY   Loss of memory 05/30/2017   Perforation of left tympanic membrane 05/17/2013   Pneumonia    PTSD (post-traumatic stress disorder)    PTSD (post-traumatic stress disorder)    Thyroid  condition    PT WAS JUST TOLD ON 08-25-17 THAT SHE HAS A THYROID  PROBLEM AND IS GOING TO F/U WITH ENDOCRINOLOGIST IN 2 WEEKS   Trichomonas  vaginalis (TV) infection 06/20/2020    Past Surgical History:  Procedure Laterality Date   ABDOMINAL HYSTERECTOMY     CESAREAN SECTION     x3   CORONARY ARTERY BYPASS GRAFT N/A 09/02/2022   Procedure: CORONARY ARTERY BYPASS GRAFTING (CABG) X 1 USING LEFT INTERNAL MAMMARY ARTERY;  Surgeon: Lucas Dorise POUR, MD;  Location: [facility] OR;  Service: Open Heart Surgery;  Laterality: N/A;   CYSTOSCOPY N/A 09/29/2017   Procedure: CYSTOSCOPY;  Surgeon: Lake Read, MD;  Location: [facility] ORS;  Service: Gynecology;  Laterality: N/A;   CYSTOSCOPY N/A 11/05/2017   Procedure: CYSTOSCOPY;  Surgeon: Lake Read, MD;  Location: [facility] ORS;  Service: Gynecology;  Laterality: N/A;   HYSTEROSCOPY WITH D & C N/A 09/01/2017   Procedure: DILATATION AND CURETTAGE /HYSTEROSCOPY;  Surgeon: Lake Read, MD;  Location: [facility] ORS;  Service: Gynecology;  Laterality: N/A;   IMPLANT POCKET INCISION & DRAINAGE N/A 10/06/2023   Procedure: IMPLANT POCKET INCISION & DRAINAGE;  Surgeon: Kennyth Chew, MD;  Location: [facility] INVASIVE CV LAB;  Service: Cardiovascular;  Laterality: N/A;   LAPAROSCOPY N/A 09/01/2017   Procedure: LAPAROSCOPY OPERATIVE with biopsy;  Surgeon: Lake Read, MD;  Location: [facility] ORS;  Service: Gynecology;  Laterality: N/A;   LAPAROSCOPY N/A 11/05/2017   Procedure: LAPAROSCOPY DIAGNOSTIC;  Surgeon: Lake Read, MD;  Location: [facility] ORS;  Service: Gynecology;  Laterality: N/A;  LEFT HEART CATH AND CORONARY ANGIOGRAPHY N/A 09/01/2022   Procedure: LEFT HEART CATH AND CORONARY ANGIOGRAPHY;  Surgeon: Mady Bruckner, MD;  Location: [facility] INVASIVE CV LAB;  Service: Cardiovascular;  Laterality: N/A;   REPAIR VAGINAL CUFF N/A 11/05/2017   Procedure: REPAIR VAGINAL CUFF;  Surgeon: Lake Read, MD;  Location: [facility] ORS;  Service: Gynecology;  Laterality: N/A;   SUBQ ICD IMPLANT N/A 08/08/2023   Procedure: SUBQ ICD IMPLANT;  Surgeon: Kennyth Chew, MD;  Location: [facility] INVASIVE CV LAB;  Service:  Cardiovascular;  Laterality: N/A;   SUBQ ICD REVISION N/A 08/09/2023   Procedure: SUBQ ICD REVISION;  Surgeon: Kennyth Chew, MD;  Location: [facility] INVASIVE CV LAB;  Service: Cardiovascular;  Laterality: N/A;   SUBQ ICD REVISION N/A 09/05/2023   Procedure: SUBQ ICD REVISION;  Surgeon: Kennyth Chew, MD;  Location: [facility] INVASIVE CV LAB;  Service: Cardiovascular;  Laterality: N/A;   TEE WITHOUT CARDIOVERSION N/A 09/02/2022   Procedure: TRANSESOPHAGEAL ECHOCARDIOGRAM;  Surgeon: Lucas Dorise POUR, MD;  Location: [facility] OR;  Service: Open Heart Surgery;  Laterality: N/A;   TOTAL LAPAROSCOPIC HYSTERECTOMY WITH SALPINGECTOMY Bilateral 09/29/2017   Procedure: TOTAL LAPAROSCOPIC HYSTERECTOMY WITH SALPINGECTOMY;  Surgeon: Lake Read, MD;  Location: [facility] ORS;  Service: Gynecology;  Laterality: Bilateral;    MEDICATIONS:  Prior to Admission medications   Medication Sig Start Date End Date Taking? Authorizing Provider  acetaminophen  (TYLENOL ) 500 MG tablet Take 1,000 mg by mouth 2 (two) times daily.    [provider]  ALPRAZolam  (ALPRAZOLAM  XR) 0.5 MG 24 hr tablet TAKE 1 TABLET BY MOUTH TWICE DAILY AS NEEDED FOR ANXIETY OR SLEEP 10/25/23   Orlean Alan HERO, FNP  aspirin  81 MG chewable tablet Chew 1 tablet (81 mg total) by mouth daily. 05/03/23   Furth, Cadence H, PA-C  atorvastatin  (LIPITOR) 40 MG tablet Take 1 tablet (40 mg total) by mouth daily. 08/01/23   Donette Ellouise LABOR, FNP  Biotin w/ Vitamins C & E (HAIR SKIN & NAILS GUMMIES PO) Take 1 tablet by mouth daily. Biotin    [provider]  cetirizine  (ZYRTEC ) 10 MG tablet Take 1 tablet (10 mg total) by mouth daily. Patient taking differently: Take 10 mg by mouth at bedtime. 08/01/23   Orlean Alan HERO, FNP  clopidogrel  (PLAVIX ) 75 MG tablet Take 1 tablet (75 mg total) by mouth daily. 08/01/23   Donette Ellouise LABOR, FNP  dapagliflozin  propanediol (FARXIGA ) 10 MG TABS tablet Take 1 tablet (10 mg total) by mouth daily before breakfast. 09/30/23    Bensimhon, Toribio SAUNDERS, MD  diphenhydrAMINE  (BENADRYL ) 25 MG tablet Take 50 mg by mouth every 6 (six) hours as needed (allergic reaction). Dye and glutin free    [provider]  EPIPEN  2-PAK 0.3 MG/0.3ML SOAJ injection INJECT INTO THIGH MUSCLE THROUGH CLOTHES AS NEEDED SEVERE ALLERGIC REACTION 08/01/23   Orlean Alan HERO, FNP  fluconazole  (DIFLUCAN ) 150 MG tablet Take 1 tablet (150 mg total) by mouth daily. 10/06/23   Orlean Alan HERO, FNP  fluticasone  (FLONASE ) 50 MCG/ACT nasal spray 1 spray by Each Nare route daily. Patient taking differently: Place 1 spray into both nostrils daily as needed (During the summer). 06/17/22   Orlean Alan HERO, FNP  furosemide  (LASIX ) 20 MG tablet Take 1 tablet (20 mg total) by mouth daily as needed. 06/14/23 10/25/23  Kennyth Chew, MD  linezolid  (ZYVOX ) 600 MG tablet Take 1 tablet (600 mg total) by mouth 2 (two) times daily. Patient not taking: Reported on 10/25/2023 09/22/23   Luiz Channel,  MD  methimazole  (TAPAZOLE ) 10 MG tablet Take 1 tablet (10 mg total) by mouth daily. Patient taking differently: Take 15 mg by mouth daily. Total 15 mg 07/14/23   Orlean Alan HERO, FNP  metoprolol  succinate (TOPROL  XL) 25 MG 24 hr tablet Take 1 tablet (25 mg total) by mouth daily. 08/01/23   Donette Ellouise LABOR, FNP  montelukast  (SINGULAIR ) 10 MG tablet Take 1 tablet (10 mg total) by mouth at bedtime. 08/01/23   Orlean Alan HERO, FNP  omeprazole  (PRILOSEC) 20 MG capsule Take 1 capsule (20 mg total) by mouth 2 (two) times daily. 08/01/23   Orlean Alan HERO, FNP  ondansetron  (ZOFRAN -ODT) 4 MG disintegrating tablet Take 1 tablet (4 mg total) by mouth every 8 (eight) hours as needed for nausea or vomiting. 08/01/23   Orlean Alan HERO, FNP  spironolactone  (ALDACTONE ) 25 MG tablet Take 1 tablet (25 mg total) by mouth daily. 08/01/23   Donette Ellouise LABOR, FNP  traMADol  HCl 75 MG TABS Take 75 mg by mouth every 8 (eight) hours as needed (for severe pain). 10/25/23   Kennyth Chew, MD   Ubrogepant  100 MG TABS Take 1 tablet by mouth See admin instructions. Take one tablet by mouth at onset of migraine. May take an additional tablet two hours later if migraine persists.  No more than 2 tablets in 24 hours.    [provider]    Physical Exam   Triage Vital Signs: ED Triage Vitals  Encounter Vitals Group     BP 11/02/23 2314 (!) 138/103     Girls Systolic BP Percentile --      Girls Diastolic BP Percentile --      Boys Systolic BP Percentile --      Boys Diastolic BP Percentile --      Pulse Rate 11/02/23 2314 66     Resp 11/02/23 2314 18     Temp 11/02/23 2314 98.5 F (36.9 C)     Temp Source 11/02/23 2314 Oral     SpO2 11/02/23 2314 96 %     Weight 11/02/23 2313 251 lb 15.8 oz (114.3 kg)     Height 11/02/23 2313 5' 7 (1.702 m)     Head Circumference --      Peak Flow --      Pain Score --      Pain Loc --      Pain Education --      Exclude from Growth Chart --     Most recent vital signs: Vitals:   11/03/23 0330 11/03/23 0430  BP: 101/82 106/83  Pulse: 91 81  Resp:  18  Temp:    SpO2: 99% 97%    CONSTITUTIONAL: Alert, responds appropriately to questions.  Obese, appears anxious HEAD: Normocephalic, atraumatic EYES: Conjunctivae clear, pupils appear equal, sclera nonicteric ENT: normal nose; moist mucous membranes NECK: Supple, normal ROM CARD: RRR; S1 and S2 appreciated RESP: Normal chest excursion without splinting or tachypnea; breath sounds clear and equal bilaterally; no wheezes, no rhonchi, no rales, no hypoxia or respiratory distress, speaking full sentences ABD/GI: Non-distended; soft, tender throughout the right abdomen mostly in the right lower quadrant, minimal tenderness in the left lower abdomen, no tenderness in the upper abdomen, no guarding or rebound BACK: The back appears normal EXT: Normal ROM in all joints; no deformity noted, no edema, no pain with manipulation of the right hip or knee SKIN: Normal color for age and  race; warm; no rash on exposed skin NEURO: Moves all  extremities equally, normal speech PSYCH: The patient's mood and manner are appropriate.   ED Results / Procedures / Treatments   LABS: (all labs ordered are listed, but only abnormal results are displayed) Labs Reviewed  CBC WITH DIFFERENTIAL/PLATELET - Abnormal; Notable for the following components:      Result Value   RBC 5.19 (*)    Hemoglobin 15.7 (*)    HCT 46.9 (*)    All other components within normal limits  COMPREHENSIVE METABOLIC PANEL WITH GFR - Abnormal; Notable for the following components:   Potassium 3.3 (*)    CO2 21 (*)    Glucose, Bld 109 (*)    All other components within normal limits  URINALYSIS, ROUTINE W REFLEX MICROSCOPIC - Abnormal; Notable for the following components:   Color, Urine YELLOW (*)    APPearance HAZY (*)    Glucose, UA >=500 (*)    Bacteria, UA RARE (*)    All other components within normal limits     EKG:  EKG Interpretation Date/Time:    Ventricular Rate:    PR Interval:    QRS Duration:    QT Interval:    QTC Calculation:   R Axis:      Text Interpretation:           RADIOLOGY: My personal review and interpretation of imaging: Pelvic mass seen on imaging.  No appendicitis.  I have personally reviewed all radiology reports.   US  PELVIC COMPLETE W TRANSVAGINAL AND TORSION R/O Result Date: 11/03/2023 EXAM: US  Pelvis, Complete Transvaginal and Transabdominal without Doppler TECHNIQUE: Transabdominal and transvaginal pelvic duplex ultrasound using B-mode/gray scaled imaging without Doppler spectral analysis and color flow was obtained. COMPARISON: CT AP from 12/12/2023 CLINICAL HISTORY: Pelvic pain. FINDINGS: UTERUS: Previous hysterectomy. ENDOMETRIAL STRIPE: Not applicable due to hysterectomy. RIGHT OVARY: Right ovary is suboptimally visualized. LEFT OVARY: Left ovary is suboptimally visualized. FREE FLUID: No free fluid. PELVIS: There is an enlarging cystic mass within the  pelvis with mural thickening and diffuse low-level internal echoes on the current exam. This measures 12.4 x 8.2 x 8.9 cm. Previously this measured 7.4 x 5.8 x 5.3 cm. Increased vascularity within the thickened wall of the cystic structure is noted. IMPRESSION: 1. Enlarging cystic mass within the pelvis with mural thickening, diffuse low-level internal echoes, and increased vascularity within the thickened wall, measuring 12.4 x 8.2 x 8.9 cm, previously 7.4 x 5.8 x 5.3 cm. 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 Consider follow-up imaging with non-emergent, dedicated pelvic MRI with and without contrast material. 2. Suboptimal visualization of the ovaries 3. Status post hysterectomy Electronically signed by: Waddell Calk MD 11/03/2023 06:40 AM EDT RP Workstation: [id number]   CT Renal Stone Study Result Date: 11/02/2023 CLINICAL DATA:  Right-sided flank pain for several days, initial EN EXAM: CT ABDOMEN AND PELVIS WITHOUT CONTRAST TECHNIQUE: Multidetector CT imaging of the abdomen and pelvis was performed following the standard protocol without IV contrast. RADIATION DOSE REDUCTION: This exam was performed according to the departmental dose-optimization program which includes automated exposure control, adjustment of the mA and/or kV according to patient size and/or use of iterative reconstruction technique. COMPARISON:  12/12/2022, 08/31/2022 FINDINGS: Lower chest: Lung bases again demonstrate a right lower lobe pulmonary nodule best seen on image number 43 of series 4. This is stable dating back to 2019 in retrospect and no further follow-up is recommended. Hepatobiliary: No focal liver abnormality is seen. Status post cholecystectomy. No biliary dilatation. Pancreas: Unremarkable. No pancreatic  ductal dilatation or surrounding inflammatory changes. Spleen: Normal in size without focal abnormality. Adrenals/Urinary Tract: Adrenal glands are within normal limits. Kidneys  demonstrate a normal appearance. No renal calculi or obstructive changes are noted. Ureters are within normal limits. The bladder is decompressed. Stomach/Bowel: No obstructive or inflammatory changes of the colon are noted. The appendix is within normal limits. Stomach and small bowel are unremarkable. Vascular/Lymphatic: No significant vascular findings are present. No enlarged abdominal or pelvic lymph nodes. Reproductive: Uterus has been surgically removed. A large 10.4 cm cystic lesion is noted in the mid pelvis. This has increased in size from the prior exam at which time it measured 7.4 cm. Other: No abdominal wall hernia or abnormality. No abdominopelvic ascites. Musculoskeletal: No acute or significant osseous findings. IMPRESSION: Enlarging 10.4 cm cystic lesion in the region of the left ovary when compared to the prior exam. Given the overall size and enlargement from the prior exam the possibility of low-grade cystic neoplasm deserves consideration. Gynecologic consult is recommended. Stable right lower lobe pulmonary nodule dating back to 2019. No follow-up is recommended. Electronically Signed   By: Oneil Devonshire M.D.   On: 11/02/2023 23:51     PROCEDURES:  Critical Care performed: No    Procedures    IMPRESSION / MDM / ASSESSMENT AND PLAN / ED COURSE  I reviewed the triage vital signs and the nursing notes.    Patient here with right-sided abdominal pain, vomiting x 2 weeks.   DIFFERENTIAL DIAGNOSIS (includes but not limited to):   UTI, pyelonephritis, kidney stone, appendicitis, ovarian torsion, ovarian cyst   Patient's presentation is most consistent with acute presentation with potential threat to life or bodily function.   PLAN: Workup initiated from triage.  No leukocytosis.  Normal electrolytes, creatinine, LFTs.  Urine does not appear infected today but she did just start taking Macrobid  a day ago.  Have advised her to continue this medication especially given she was  experiencing dysuria.  CT renal study was obtained from triage and shows a normal appendix.  She does however have a 10.4 cm cystic lesion in the region of the left ovary.  This was seen previously on imaging in 2024 but at that time was 7.4 cm.  When I asked patient about this she states that no one has ever told her that this was present and she is very upset.  Explained to patient that I have low suspicion that this is what causing her right-sided abdominal pain for 2 weeks but I will discuss the case with OB/GYN on-call and obtain a transvaginal ultrasound with Doppler to rule out torsion.  Will give pain and nausea medicine here.   MEDICATIONS GIVEN IN ED: Medications  HYDROcodone -acetaminophen  (NORCO/VICODIN) 5-325 MG per tablet 2 tablet (2 tablets Oral Given 11/03/23 0304)  ondansetron  (ZOFRAN -ODT) disintegrating tablet 4 mg (4 mg Oral Given 11/03/23 0304)     ED COURSE: Discussed with Damien Parsley, certified nurse midwife with OB/GYN.  We have reviewed the case together and she agrees that if no torsion, patient can follow-up as an outpatient with OB/GYN for further imaging/workup.  Patient is aware of this plan as well.  She is aware that we cannot rule out malignancy today and that it is not very important that she follow-up as an outpatient.  She was previously a patient of [facility], last saw them in 2019 for her hysterectomy.   Ultrasound shows enlarging cystic mass within the pelvis measuring 12.4 x 8.2 x 8.9 cm that  are concerning for cystic ovarian neoplasm that could be benign versus malignant.  Ovaries not well-visualized.  Again low suspicion for torsion.  This mass seems to be more on the left side but could be causing some of her right-sided abdominal pain.  Again have encouraged her to follow-up with OB/GYN as an outpatient as she will need a pelvic MRI with and without contrast.  She is comfortable with this plan.  Based on her controlled substance reporting system  database information it appears patient receives tramadol  prescriptions regularly.  Will have her continue this as needed and discharged with Zofran .   At this time, I do not feel there is any life-threatening condition present. I reviewed all nursing notes, vitals, pertinent previous records.  All lab and urine results, EKGs, imaging ordered have been independently reviewed and interpreted by myself.  I reviewed all available radiology reports from any imaging ordered this visit.  Based on my assessment, I feel the patient is safe to be discharged home without further emergent workup and can continue workup as an outpatient as needed. Discussed all findings, treatment plan as well as usual and customary return precautions.  They verbalize understanding and are comfortable with this plan.  Outpatient follow-up has been provided as needed.  All questions have been answered.   CONSULTS: OB/GYN consulted.   OUTSIDE RECORDS REVIEWED: Reviewed recent cardiology notes.       FINAL CLINICAL IMPRESSION(S) / ED DIAGNOSES   Final diagnoses:  Right lower quadrant abdominal pain  Unspecified ovarian cyst, left side     Rx / DC Orders   ED Discharge Orders          Ordered    ondansetron  (ZOFRAN -ODT) 4 MG disintegrating tablet  Every 6 hours PRN        11/03/23 0636             Note:  This document was prepared using Dragon voice recognition software and may include unintentional dictation errors.   Madison Direnzo, Josette SAILOR, DO 11/03/23 [phone]

## 2023-11-03 NOTE — Discharge Instructions (Addendum)
 You may alternate over the counter Tylenol  1000 mg every 6 hours as needed for pain, fever and Ibuprofen  800 mg every 6-8 hours as needed for pain, fever.  Please take Ibuprofen  with food.  Do not take more than 4000 mg of Tylenol  (acetaminophen ) in a 24 hour period.  Please continue your tramadol  as prescribed.  Please follow-up with your primary care doctor for further evaluation for your right-sided abdominal pain and vomiting that have been ongoing for 2 weeks.  Your appendix was normal on CT scan today.  Given you are having symptoms of a urinary tract infection, I recommend you continue the antibiotics that you started.  Your urine appears normal today but this could be because you have started these antibiotics.  You have a large cystic lesion noted to the left ovary.  You will need to follow-up closely with OB/GYN for further workup for this.  We cannot rule out cancer in the emergency department so it is very important that you follow-up.  If you develop sudden onset severe left lower abdominal pain, vomiting that does not stop, fever, any other symptom concerning to you, please return to the emergency department.

## 2023-11-08 ENCOUNTER — Encounter: Payer: MEDICAID | Admitting: Cardiology

## 2023-11-08 ENCOUNTER — Encounter: Payer: Self-pay | Admitting: Family

## 2023-11-08 ENCOUNTER — Ambulatory Visit: Payer: MEDICAID | Admitting: Family

## 2023-11-08 VITALS — BP 108/70 | HR 99 | Ht 67.0 in | Wt 253.0 lb

## 2023-11-08 DIAGNOSIS — I2089 Other forms of angina pectoris: Secondary | ICD-10-CM | POA: Diagnosis not present

## 2023-11-08 DIAGNOSIS — F419 Anxiety disorder, unspecified: Secondary | ICD-10-CM | POA: Diagnosis not present

## 2023-11-08 DIAGNOSIS — N83209 Unspecified ovarian cyst, unspecified side: Secondary | ICD-10-CM

## 2023-11-08 DIAGNOSIS — F32A Depression, unspecified: Secondary | ICD-10-CM

## 2023-11-08 DIAGNOSIS — R102 Pelvic and perineal pain: Secondary | ICD-10-CM | POA: Diagnosis not present

## 2023-11-08 DIAGNOSIS — Z0130 Encounter for examination of blood pressure without abnormal findings: Secondary | ICD-10-CM

## 2023-11-08 MED ORDER — NALOXONE HCL 4 MG/0.1ML NA LIQD: 1.0000 | Freq: Once | NASAL | 0 refills | Status: AC | PRN

## 2023-11-08 MED ORDER — HYDROCODONE-ACETAMINOPHEN 5-325 MG PO TABS: 1.0000 | ORAL_TABLET | ORAL | 0 refills | Status: AC | PRN

## 2023-11-08 NOTE — Patient Instructions (Signed)
 Please take 1 tablet every 6 hours for the pain meds. Can take 2 if you need to.   Do not take more often than every 6 hours.

## 2023-11-09 ENCOUNTER — Inpatient Hospital Stay: Payer: MEDICAID | Attending: Obstetrics and Gynecology | Admitting: Nurse Practitioner

## 2023-11-09 ENCOUNTER — Telehealth: Payer: Self-pay | Admitting: Nurse Practitioner

## 2023-11-09 ENCOUNTER — Encounter: Payer: Self-pay | Admitting: Nurse Practitioner

## 2023-11-09 ENCOUNTER — Inpatient Hospital Stay: Payer: MEDICAID

## 2023-11-09 VITALS — BP 116/79 | HR 94 | Temp 98.6°F | Resp 18 | Wt 253.2 lb

## 2023-11-09 DIAGNOSIS — Z90710 Acquired absence of both cervix and uterus: Secondary | ICD-10-CM | POA: Insufficient documentation

## 2023-11-09 DIAGNOSIS — N83201 Unspecified ovarian cyst, right side: Secondary | ICD-10-CM | POA: Diagnosis present

## 2023-11-09 DIAGNOSIS — N83202 Unspecified ovarian cyst, left side: Secondary | ICD-10-CM | POA: Insufficient documentation

## 2023-11-09 DIAGNOSIS — F17210 Nicotine dependence, cigarettes, uncomplicated: Secondary | ICD-10-CM | POA: Diagnosis not present

## 2023-11-09 DIAGNOSIS — M51369 Other intervertebral disc degeneration, lumbar region without mention of lumbar back pain or lower extremity pain: Secondary | ICD-10-CM | POA: Insufficient documentation

## 2023-11-09 DIAGNOSIS — N83209 Unspecified ovarian cyst, unspecified side: Secondary | ICD-10-CM

## 2023-11-09 DIAGNOSIS — R102 Pelvic and perineal pain: Secondary | ICD-10-CM | POA: Insufficient documentation

## 2023-11-09 NOTE — Telephone Encounter (Signed)
 Called pt to schedule CT - no answer - vm full - LH

## 2023-11-09 NOTE — Telephone Encounter (Signed)
 Pt called and said she would call to schedule CT tomorrow after her appt. Said that she has to call a ride for her appts and wanted to line both appts up so she doesn't have to come multiple times - [facility]

## 2023-11-09 NOTE — Progress Notes (Signed)
 Gynecologic Oncology Consult Visit   Referring Provider: Dr Verdon  Chief Complaint: Pelvic Mass  Subjective:  Elizabeth Spencer is a 38 y.o. G19P3003 female with a past medical history of bipolar affective disorder, CAD s/p CABG, ischemic cardiomyopathy with ICD implant and explant, systolic CHF with EF 30-35%, cocaine use, and factor V Leiden with DVT of LUE, who is seen in consultation from Dr. Verdon for enlarging pelvic cystic mass.    She has a history of hysterectomy for abnormal uterine bleeding and chronic pelvic pain with Dr. Lake in June 2019 with vaginal cuff dehiscence post repair 11/2017.   Pap- 08/20/17- NILM HPV Negative.  Endometrial biopsy 09/02/17-secretory endometrium 12/14/2016-preoperative ultrasound was reported as normal. 09/02/2017-diagnostic laparoscopy showed some mild adhesive disease in the left adnexa excised prior tubal ligation.  12/12/22- CT Abdomen Pelvis w/ Contrast w/ Dr Jordis - 7.4 cm simple appearing left ovarian cyst. Recommend follow-up pelvic US  in 3-6 months. - Moderate cholelithiasis and sludge with findings suggesting gallbladder wall thickening. Findings are concerning for acute cholecystitis, although recent right upper quadrant ultrasound and HIDA scan suggest no evidence of acute cholecystitis. She underwent cholecystectomy and did not have follow-up ultrasound.  For surgical evaluation.  She complains of ongoing pelvic pain on right and left side.  Says it has been worse over the past few weeks.  Denies changes in bowel movements.  Reports feeling like she has a rock in her urethra.  Denies any complaints of seeing.  Denies fevers or chills.  Says that she was checked for UTI which was negative.  She was empirically dosed with antibiotics but symptoms did not change.  Says she was also treated for yeast infection and symptoms are unchanged.  She denies unintentional weight loss.    Problem List: Patient Active Problem List   Diagnosis Date Noted    Postoperative infection 09/02/2023   Surgical site infection 09/02/2023   Tachycardia 09/02/2023   Sepsis (HCC) 09/01/2023   Cardiac defibrillator in situ 08/30/2023   Chronic systolic heart failure (HCC) 08/08/2023   Vitamin D  deficiency, unspecified 05/08/2023   Acute deep vein thrombosis (DVT) of brachial vein of left upper extremity (HCC) 01/04/2023   Cholecystitis, acute 12/12/2022   Cholelithiasis 12/11/2022   Dyslipidemia 12/11/2022   Hyperthyroidism 12/11/2022   Anxiety and depression 12/11/2022   Asthma, chronic 12/11/2022   Coronary artery disease 12/11/2022   Biliary colic 12/11/2022   Obstructive sleep apnea 10/24/2022   Calculus of gallbladder with acute on chronic cholecystitis without obstruction 10/24/2022   Insomnia 10/24/2022   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   NSTEMI (non-ST elevated myocardial infarction) (HCC) 09/01/2022   Chest pain 08/31/2022   Gastroesophageal reflux disease 12/27/2019   Multinodular goiter 08/08/2018   Vaginal cuff dehiscence 11/05/2017   S/P laparoscopic hysterectomy 09/29/2017   Chronic tension-type headache, not intractable 05/30/2017   Migraine without aura and without status migrainosus, not intractable 05/30/2017   Arthropathy of lumbar facet joint 04/08/2017   Anxiety 11/08/2013   Obesity 11/08/2013   Bipolar affective disorder (HCC) 05/17/2013   Depression 08/18/2012   Pain of upper abdomen 07/31/2012   Past Medical History: Past Medical History:  Diagnosis Date   Aneurysm (HCC) 01/04/2014   Arthritis    Asthma    WELL CONTROLLED   Brain aneurysm 2007   NEUROLOGY NOTE DOES NOT MENTION ANEURYSM BUT PT STATES SHE DID NOT HAVE TO HAVE SURGERY   Complication of anesthesia    FOR 1 C-SECTION PT WAS ITCHING AND VERY  RED ON HER FACE   Cough, persistent 01/27/2016   Dermatitis due to sunburn 11/08/2013   Family history of adverse reaction to anesthesia    brother, neice and nephew got red in face with hives   Gallstones    GERD  (gastroesophageal reflux disease)    Gonorrhea 06/20/2020   Headache    CHRONIC HEADACHES   History of chronic cough    DRY   Loss of memory 05/30/2017   Perforation of left tympanic membrane 05/17/2013   Pneumonia    PTSD (post-traumatic stress disorder)    PTSD (post-traumatic stress disorder)    Thyroid  condition    PT WAS JUST TOLD ON 08-25-17 THAT SHE HAS A THYROID  PROBLEM AND IS GOING TO F/U WITH ENDOCRINOLOGIST IN 2 WEEKS   Trichomonas vaginalis (TV) infection 06/20/2020   Past Surgical History: Past Surgical History:  Procedure Laterality Date   ABDOMINAL HYSTERECTOMY     CESAREAN SECTION     x3   CORONARY ARTERY BYPASS GRAFT N/A 09/02/2022   Procedure: CORONARY ARTERY BYPASS GRAFTING (CABG) X 1 USING LEFT INTERNAL MAMMARY ARTERY;  Surgeon: Lucas Dorise POUR, MD;  Location: [facility] OR;  Service: Open Heart Surgery;  Laterality: N/A;   CYSTOSCOPY N/A 09/29/2017   Procedure: CYSTOSCOPY;  Surgeon: Lake Read, MD;  Location: [facility] ORS;  Service: Gynecology;  Laterality: N/A;   CYSTOSCOPY N/A 11/05/2017   Procedure: CYSTOSCOPY;  Surgeon: Lake Read, MD;  Location: [facility];  Service: Gynecology;  Laterality: N/A;   HYSTEROSCOPY WITH D & C N/A 09/01/2017   Procedure: DILATATION AND CURETTAGE /HYSTEROSCOPY;  Surgeon: Lake Read, MD;  Location: [facility] OR[facility];  Service: Gynecology;  Laterality: N/A;   IMPLANT POCKET INCISION & DRAINAGE N/A 10/06/2023   Procedure: IMPLANT POCKET INCISION & DRAINAGE;  Surgeon: Kennyth Chew, MD;  Location: [facility] INVASIVE CV [facility];  Service: Cardiovascular;  Laterality: N/A;   LAPAROSCOPY N/A 09/01/2017   Procedure: LAPAROSCOPY OPERATIVE with biopsy;  Surgeon: Lake Read, MD;  Location: [facility];  Service: Gynecology;  Laterality: N/A;   LAPAROSCOPY N/A 11/05/2017   Procedure: LAPAROSCOPY DIAGNOSTIC;  Surgeon: Lake Read, MD;  Location: [facility];  Service: Gynecology;  Laterality: N/A;   LEFT HEART CATH AND CORONARY ANGIOGRAPHY N/A  09/01/2022   Procedure: LEFT HEART CATH AND CORONARY ANGIOGRAPHY;  Surgeon: Mady Bruckner, MD;  Location: [facility] INVASIVE CV LAB;  Service: Cardiovascular;  Laterality: N/A;   REPAIR VAGINAL CUFF N/A 11/05/2017   Procedure: REPAIR VAGINAL CUFF;  Surgeon: Lake Read, MD;  Location: [facility];  Service: Gynecology;  Laterality: N/A;   SUBQ ICD IMPLANT N/A 08/08/2023   Procedure: SUBQ ICD IMPLANT;  Surgeon: Kennyth Chew, MD;  Location: [facility] INVASIVE CV LAB;  Service: Cardiovascular;  Laterality: N/A;   SUBQ ICD REVISION N/A 08/09/2023   Procedure: SUBQ ICD REVISION;  Surgeon: Kennyth Chew, MD;  Location: [facility] INVASIVE CV LAB;  Service: Cardiovascular;  Laterality: N/A;   SUBQ ICD REVISION N/A 09/05/2023   Procedure: SUBQ ICD REVISION;  Surgeon: Kennyth Chew, MD;  Location: [facility] INVASIVE CV LAB;  Service: Cardiovascular;  Laterality: N/A;   TEE WITHOUT CARDIOVERSION N/A 09/02/2022   Procedure: TRANSESOPHAGEAL ECHOCARDIOGRAM;  Surgeon: Lucas Dorise POUR, MD;  Location: [facility] OR;  Service: Open Heart Surgery;  Laterality: N/A;   TOTAL LAPAROSCOPIC HYSTERECTOMY WITH SALPINGECTOMY Bilateral 09/29/2017   Procedure: TOTAL LAPAROSCOPIC HYSTERECTOMY WITH SALPINGECTOMY;  Surgeon: Lake Read, MD;  Location: [facility] OR[facility];  Service: Gynecology;  Laterality: Bilateral;   Past Gynecologic History:  Post hysterectomy Denies history  of HRT use.  Denies abnormal Pap. Unsure of STD history. Sexually active.   OB History:  OB History  Gravida Para Term Preterm AB Living  3 3 3   3   SAB IAB Ectopic Multiple Live Births      3    # Outcome Date GA Lbr Len/2nd Weight Sex Type Anes PTL Lv  3 Term 12/13/07     CS-LTranv     2 Term 10/10/06     CS-LTranv     1 Term 10/11/05     CS-LTranv       Family History: Family History  Problem Relation Age of Onset   Hypertension Mother    Alcohol abuse Mother    Deep vein thrombosis Mother    Alcohol abuse Father    COPD Father    Emphysema Father    Heart  disease Father     Social History: Social History   Socioeconomic History   Marital status: Single    Spouse name: Not on file   Number of children: Not on file   Years of education: Not on file   Highest education level: Not on file  Occupational History   Not on file  Tobacco Use   Smoking status: Every Day    Current packs/day: 0.50    Average packs/day: 0.5 packs/day for 18.0 years (9.0 ttl pk-yrs)    Types: Cigarettes    Passive exposure: Past   Smokeless tobacco: Never  Vaping Use   Vaping status: Never Used  Substance and Sexual Activity   Alcohol use: Yes    Alcohol/week: 0.0 standard drinks of alcohol    Comment: occasionally   Drug use: Not Currently    Types: Marijuana, Cocaine   Sexual activity: Yes    Partners: Male    Birth control/protection: Surgical    Comment: Hysterectomy  Other Topics Concern   Not on file  Social History Narrative   Not on file   Social Drivers of Health   Financial Resource Strain: Not on file  Food Insecurity: No Food Insecurity (09/02/2023)   Hunger Vital Sign    Worried About Running Out of Food in the Last Year: Never true    Ran Out of Food in the Last Year: Never true  Transportation Needs: No Transportation Needs (09/02/2023)   PRAPARE - Administrator, Civil Service (Medical): No    Lack of Transportation (Non-Medical): No  Physical Activity: Not on file  Stress: Not on file  Social Connections: Unknown (08/08/2023)   Social Connection and Isolation Panel    Frequency of Communication with Friends and Family: More than three times a week    Frequency of Social Gatherings with Friends and Family: More than three times a week    Attends Religious Services: Not on file    Active Member of Clubs or Organizations: Not on file    Attends Banker Meetings: Not on file    Marital Status: Living with partner  Intimate Partner Violence: Not At Risk (09/02/2023)   Humiliation, Afraid, Rape, and Kick  questionnaire    Fear of Current or Ex-Partner: No    Emotionally Abused: No    Physically Abused: No    Sexually Abused: No    Allergies: Allergies  Allergen Reactions   Azithromycin Swelling and Other (See Comments)   Clindamycin  Swelling   Codeine Hives and Other (See Comments)    Nausea/dizzy    Meloxicam  Other (See Comments)  Burns up from the inside    Neurontin [Gabapentin] Other (See Comments)    Makes her pass out and not remember what happened prior to taking med.   Penicillins Anaphylaxis    Patient allergic to all cillins Has patient had a PCN reaction causing immediate rash, facial/tongue/throat swelling, SOB or lightheadedness with hypotension: Yes Has patient had a PCN reaction causing severe rash involving mucus membranes or skin necrosis: Yes Has patient had a PCN reaction that required hospitalization: No Has patient had a PCN reaction occurring within the last 10 years: Yes If all of the above answers are NO, then may proceed with Cephalosporin use.    Clindamycin /Lincomycin Swelling   Other Hives and Other (See Comments)    Berry flavored food and drinks, blue and red dye Can have Strawberry and Banana   Diphenhydramine  Hcl Rash and Other (See Comments)    Uses dye-free; allergic to dye in regular benadryl    Doxycycline Rash   Egg-Derived Products Rash    Current Medications: Current Outpatient Medications  Medication Sig Dispense Refill   acetaminophen  (TYLENOL ) 500 MG tablet Take 1,000 mg by mouth 2 (two) times daily.     ALPRAZolam  (ALPRAZOLAM  XR) 0.5 MG 24 hr tablet TAKE 1 TABLET BY MOUTH TWICE DAILY AS NEEDED FOR ANXIETY OR SLEEP 60 tablet 2   aspirin  81 MG chewable tablet Chew 1 tablet (81 mg total) by mouth daily. 90 tablet 0   atorvastatin  (LIPITOR) 40 MG tablet Take 1 tablet (40 mg total) by mouth daily. 90 tablet 3   Biotin w/ Vitamins C & E (HAIR SKIN & NAILS GUMMIES PO) Take 1 tablet by mouth daily. Biotin     cetirizine  (ZYRTEC ) 10  MG tablet Take 1 tablet (10 mg total) by mouth daily. (Patient taking differently: Take 10 mg by mouth at bedtime.) 90 tablet 1   clopidogrel  (PLAVIX ) 75 MG tablet Take 1 tablet (75 mg total) by mouth daily. 90 tablet 3   dapagliflozin  propanediol (FARXIGA ) 10 MG TABS tablet Take 1 tablet (10 mg total) by mouth daily before breakfast. 90 tablet 3   diphenhydrAMINE  (BENADRYL ) 25 MG tablet Take 50 mg by mouth every 6 (six) hours as needed (allergic reaction). Dye and glutin free     EPIPEN  2-PAK 0.3 MG/0.3ML SOAJ injection INJECT INTO THIGH MUSCLE THROUGH CLOTHES AS NEEDED SEVERE ALLERGIC REACTION 2 each 1   fluconazole  (DIFLUCAN ) 150 MG tablet Take 1 tablet (150 mg total) by mouth daily. 5 tablet 0   fluticasone  (FLONASE ) 50 MCG/ACT nasal spray 1 spray by Each Nare route daily. (Patient taking differently: Place 1 spray into both nostrils daily as needed (During the summer).) 16 g 6   furosemide  (LASIX ) 20 MG tablet Take 1 tablet (20 mg total) by mouth daily as needed. 30 tablet 2   HYDROcodone -acetaminophen  (NORCO/VICODIN) 5-325 MG tablet Take 1-2 tablets by mouth every 4 (four) hours as needed for up to 5 days for moderate pain (pain score 4-6). DO NOT TAKE WITH TRAMADOL . Avoid taking at the same times as alprazolam . 60 tablet 0   linezolid  (ZYVOX ) 600 MG tablet Take 1 tablet (600 mg total) by mouth 2 (two) times daily. (Patient not taking: Reported on 11/08/2023) 28 tablet 0   methimazole  (TAPAZOLE ) 10 MG tablet Take 1 tablet (10 mg total) by mouth daily. (Patient taking differently: Take 15 mg by mouth daily. Total 15 mg) 90 tablet 1   metoprolol  succinate (TOPROL  XL) 25 MG 24 hr tablet Take 1 tablet (25  mg total) by mouth daily. 90 tablet 3   montelukast  (SINGULAIR ) 10 MG tablet Take 1 tablet (10 mg total) by mouth at bedtime. 90 tablet 1   naloxone  (NARCAN ) nasal spray 4 mg/0.1 mL Place 1 spray into the nose Once PRN for up to 1 dose (opioid overdose). 2 each 0   omeprazole  (PRILOSEC) 20 MG capsule  Take 1 capsule (20 mg total) by mouth 2 (two) times daily. 180 capsule 1   ondansetron  (ZOFRAN -ODT) 4 MG disintegrating tablet Take 1 tablet (4 mg total) by mouth every 6 (six) hours as needed for nausea or vomiting. 20 tablet 0   spironolactone  (ALDACTONE ) 25 MG tablet Take 1 tablet (25 mg total) by mouth daily. 90 tablet 3   traMADol  HCl 75 MG TABS Take 75 mg by mouth every 8 (eight) hours as needed (for severe pain). 42 tablet 0   Ubrogepant  100 MG TABS Take 1 tablet by mouth See admin instructions. Take one tablet by mouth at onset of migraine. May take an additional tablet two hours later if migraine persists.  No more than 2 tablets in 24 hours.     No current facility-administered medications for this visit.   Review of Systems General: negative for fevers, changes in weight or night sweats Skin: negative for changes in moles or sores or rash Eyes: negative for changes in vision HEENT: negative for change in hearing, tinnitus, voice changes Pulmonary: Positive for dyspnea with exertion Cardiac: negative for palpitations, pain Gastrointestinal: negative for nausea, vomiting, constipation, diarrhea, hematemesis, hematochezia Genitourinary/Sexual: negative for dysuria, retention, hematuria, incontinence positive per HPI Ob/Gyn: Pelvic pressure Musculoskeletal: negative for pain, joint pain, back pain Hematology: negative for easy bruising, abnormal bleeding Neurologic/Psych: negative for headaches, seizures, paralysis, weakness, numbness  Objective:  Physical Examination:  BP 116/79   Pulse 94   Temp 98.6 F (37 C)   Resp 18   Wt 253 lb 3.2 oz (114.9 kg)   LMP 08/22/2017 Comment: says she just spots.  SpO2 99%   BMI 39.66 kg/m     ECOG Performance Status: 1 - Symptomatic but completely ambulatory  GENERAL: Patient is a well appearing female in no acute distress HEENT:  Sclerae anicteric.  Oropharynx clear and moist. No ulcerations or evidence of oropharyngeal candidiasis.  Neck is supple.  NODES:  No cervical, supraclavicular, or axillary lymphadenopathy palpated.  LUNGS: No audible wheezing or coughing HEART:  Regular rate and rhythm. No murmur appreciated. ABDOMEN:  Soft, nontender.  Exam limited due to habitus and patient preference.  Declines to sit on exam table with light flat due to pain MSK:  No focal spinal tenderness to palpation. Full range of motion bilaterally in the upper extremities.  Ambulates without aids EXTREMITIES:  No peripheral edema.   SKIN:  Clear with no obvious rashes or skin changes.  NEURO: Anxious.  Tangential.  Well oriented.   She refuses pelvic exam today  Lab Review Per assessment and plan  Radiologic Imaging: Per HPI    Assessment:  Elizabeth Spencer is a 38 y.o. female who presents with enlarging pelvic mass with mural thickening now measuring 12.4 x 8.2 x 8.9 cm enlarged from ultrasound in 2020 for at which time it measured 7.4 x 5.8 x 5.3 cm.  Additionally, increased vascularity within the thickened wall.  She is status post hysterectomy for benign disease.  Symptomatic of pain.  Medical co-morbidities complicating care: 3 prior c sections, previous abdominal surgeries, prior CABG, heart failure, obesity with BMI of 39.66,  history of DVT, NSTEMI, psychiatric illness. Plan:   Problem List Items Addressed This Visit   None   A long conversation was held with the patient about the significance and potential malignant potential of a persistent complex pelvic mass.  She understands that there is no risk-free option.  Today, we discussed further workup including imaging with MRI which she refuses due to claustrophobia.  Acceptable to get CT chest abdomen pelvis to evaluate further.  Also recommend tumor markers today including CA125, inhibin A and B, HE4, and CEA.    I will plan for her to return to clinic in 1 to 2 weeks for follow-up with Gyn onc and discussion of management.   The patient's diagnosis, an outline of the  further diagnostic and laboratory studies which will be required, the recommendation for surgery, and alternatives were discussed with her and her accompanying family members.  All questions were answered to their satisfaction.  A total of 45 minutes were spent with the patient/family today; 90% was spent in education, counseling and coordination of care presenting.    Tinnie KANDICE Dawn, NP  CC:  Verdon Keen, MD [address] [place],  [place] [address] [phone]

## 2023-11-10 ENCOUNTER — Encounter: Payer: MEDICAID | Attending: Physician Assistant | Admitting: Physician Assistant

## 2023-11-10 DIAGNOSIS — Y838 Other surgical procedures as the cause of abnormal reaction of the patient, or of later complication, without mention of misadventure at the time of the procedure: Secondary | ICD-10-CM | POA: Insufficient documentation

## 2023-11-10 DIAGNOSIS — F3189 Other bipolar disorder: Secondary | ICD-10-CM | POA: Insufficient documentation

## 2023-11-10 DIAGNOSIS — G4733 Obstructive sleep apnea (adult) (pediatric): Secondary | ICD-10-CM | POA: Diagnosis not present

## 2023-11-10 DIAGNOSIS — Z86718 Personal history of other venous thrombosis and embolism: Secondary | ICD-10-CM | POA: Insufficient documentation

## 2023-11-10 DIAGNOSIS — I5022 Chronic systolic (congestive) heart failure: Secondary | ICD-10-CM | POA: Diagnosis not present

## 2023-11-10 DIAGNOSIS — G3189 Other specified degenerative diseases of nervous system: Secondary | ICD-10-CM | POA: Diagnosis not present

## 2023-11-10 DIAGNOSIS — T8131XA Disruption of external operation (surgical) wound, not elsewhere classified, initial encounter: Secondary | ICD-10-CM | POA: Diagnosis present

## 2023-11-10 DIAGNOSIS — I2510 Atherosclerotic heart disease of native coronary artery without angina pectoris: Secondary | ICD-10-CM | POA: Diagnosis not present

## 2023-11-10 DIAGNOSIS — L98492 Non-pressure chronic ulcer of skin of other sites with fat layer exposed: Secondary | ICD-10-CM | POA: Diagnosis not present

## 2023-11-10 LAB — INHIBIN B: Inhibin B: 53.7 pg/mL

## 2023-11-10 LAB — CA 125: Cancer Antigen (CA) 125: 59.2 U/mL — ABNORMAL HIGH (ref 0.0–38.1)

## 2023-11-10 LAB — CEA: CEA: 2.3 ng/mL (ref 0.0–4.7)

## 2023-11-11 ENCOUNTER — Encounter: Payer: Self-pay | Admitting: Nurse Practitioner

## 2023-11-11 LAB — HUMAN EPIDIDYMIS PROT 4,SERIAL: HE4: 58.5 pmol/L (ref 0.0–63.6)

## 2023-11-11 LAB — INHIBIN A: Inhibin-A: 28.8 pg/mL

## 2023-11-12 NOTE — Assessment & Plan Note (Signed)
Patient is seen by cardiology, who manage this condition.  She is well controlled with current therapy.   Will defer to them for further changes to plan of care.

## 2023-11-12 NOTE — Assessment & Plan Note (Signed)
 Patient stable.  Well controlled with current therapy.   Continue current meds.

## 2023-11-12 NOTE — Progress Notes (Signed)
 Established Patient Office Visit  Subjective:  Patient ID: Elizabeth Spencer, female    DOB: [birth year]  Age: 38 y.o. MRN: [id number]  Chief Complaint  Patient presents with   Hospitalization Follow-up    Patient is here today for Hospital follow up.   She is continuing to feel poorly.  Has had her pacemaker taken out, and is now waiting for an appointment with OBGYN to see what is going on with her ovaries.  While she was in the hospital, they did a CT scan and found a mass on her ovary which was significantly larger than the last time she had a scan approximately eight months ago. As a result, she was set up for urgent OB/GYN follow up on discharge.     No other concerns at this time.   Past Medical History:  Diagnosis Date   Aneurysm (HCC) 01/04/2014   Arthritis    Asthma    WELL CONTROLLED   Brain aneurysm 2007   NEUROLOGY NOTE DOES NOT MENTION ANEURYSM BUT PT STATES SHE DID NOT HAVE TO HAVE SURGERY   Complication of anesthesia    FOR 1 C-SECTION PT WAS ITCHING AND VERY RED ON HER FACE   Cough, persistent 01/27/2016   Dermatitis due to sunburn 11/08/2013   Family history of adverse reaction to anesthesia    brother, neice and nephew got red in face with hives   Gallstones    GERD (gastroesophageal reflux disease)    Gonorrhea 06/20/2020   Headache    CHRONIC HEADACHES   History of chronic cough    DRY   Loss of memory 05/30/2017   Perforation of left tympanic membrane 05/17/2013   Pneumonia    PTSD (post-traumatic stress disorder)    PTSD (post-traumatic stress disorder)    Thyroid  condition    PT WAS JUST TOLD ON 08-25-17 THAT SHE HAS A THYROID  PROBLEM AND IS GOING TO F/U WITH ENDOCRINOLOGIST IN 2 WEEKS   Trichomonas vaginalis (TV) infection 06/20/2020    Past Surgical History:  Procedure Laterality Date   ABDOMINAL HYSTERECTOMY     CESAREAN SECTION     x3   CORONARY ARTERY BYPASS GRAFT N/A 09/02/2022   Procedure: CORONARY ARTERY BYPASS GRAFTING (CABG) X 1  USING LEFT INTERNAL MAMMARY ARTERY;  Surgeon: Lucas Dorise POUR, MD;  Location: [facility];  Service: Open Heart Surgery;  Laterality: N/A;   CYSTOSCOPY N/A 09/29/2017   Procedure: CYSTOSCOPY;  Surgeon: Lake Read, MD;  Location: [facility] ORS;  Service: Gynecology;  Laterality: N/A;   CYSTOSCOPY N/A 11/05/2017   Procedure: CYSTOSCOPY;  Surgeon: Lake Read, MD;  Location: [facility] ORS;  Service: Gynecology;  Laterality: N/A;   HYSTEROSCOPY WITH D & C N/A 09/01/2017   Procedure: DILATATION AND CURETTAGE /HYSTEROSCOPY;  Surgeon: Lake Read, MD;  Location: [facility] ORS;  Service: Gynecology;  Laterality: N/A;   IMPLANT POCKET INCISION & DRAINAGE N/A 10/06/2023   Procedure: IMPLANT POCKET INCISION & DRAINAGE;  Surgeon: Kennyth Chew, MD;  Location: [facility] INVASIVE CV LAB;  Service: Cardiovascular;  Laterality: N/A;   LAPAROSCOPY N/A 09/01/2017   Procedure: LAPAROSCOPY OPERATIVE with biopsy;  Surgeon: Lake Read, MD;  Location: [facility] ORS;  Service: Gynecology;  Laterality: N/A;   LAPAROSCOPY N/A 11/05/2017   Procedure: LAPAROSCOPY DIAGNOSTIC;  Surgeon: Lake Read, MD;  Location: [facility] ORS;  Service: Gynecology;  Laterality: N/A;   LEFT HEART CATH AND CORONARY ANGIOGRAPHY N/A 09/01/2022   Procedure: LEFT HEART CATH AND CORONARY ANGIOGRAPHY;  Surgeon: Mady Bruckner, MD;  Location:  [facility] INVASIVE CV LAB;  Service: Cardiovascular;  Laterality: N/A;   REPAIR VAGINAL CUFF N/A 11/05/2017   Procedure: REPAIR VAGINAL CUFF;  Surgeon: Lake Read, MD;  Location: [facility] ORS;  Service: Gynecology;  Laterality: N/A;   SUBQ ICD IMPLANT N/A 08/08/2023   Procedure: SUBQ ICD IMPLANT;  Surgeon: Kennyth Chew, MD;  [facility] INVASIVE CV LAB;  Service: Cardiovascular;  Laterality: N/A;   SUBQ ICD REVISION N/A 08/09/2023   Procedure: SUBQ ICD REVISION;  Surgeon: Kennyth Chew, MD;  Location: [facility] INVASIVE CV LAB;  Service: Cardiovascular;  Laterality: N/A;   SUBQ ICD REVISION N/A 09/05/2023    Procedure: SUBQ ICD REVISION;  Surgeon: Kennyth Chew, MD;  Location: [facility] INVASIVE CV LAB;  Service: Cardiovascular;  Laterality: N/A;   TEE WITHOUT CARDIOVERSION N/A 09/02/2022   Procedure: TRANSESOPHAGEAL ECHOCARDIOGRAM;  Surgeon: Lucas Dorise POUR, MD;  Location: [facility] OR;  Service: Open Heart Surgery;  Laterality: N/A;   TOTAL LAPAROSCOPIC HYSTERECTOMY WITH SALPINGECTOMY Bilateral 09/29/2017   Procedure: TOTAL LAPAROSCOPIC HYSTERECTOMY WITH SALPINGECTOMY;  Surgeon: Lake Read, MD;  Location: [facility] ORS;  Service: Gynecology;  Laterality: Bilateral;    Social History   Socioeconomic History   Marital status: Single    Spouse name: Not on file   Number of children: Not on file   Years of education: Not on file   Highest education level: Not on file  Occupational History   Not on file  Tobacco Use   Smoking status: Every Day    Current packs/day: 0.50    Average packs/day: 0.5 packs/day for 18.0 years (9.0 ttl pk-yrs)    Types: Cigarettes    Passive exposure: Past   Smokeless tobacco: Never  Vaping Use   Vaping status: Never Used  Substance and Sexual Activity   Alcohol use: Yes    Alcohol/week: 0.0 standard drinks of alcohol    Comment: occasionally   Drug use: Not Currently    Types: Marijuana, Cocaine   Sexual activity: Yes    Partners: Male    Birth control/protection: Surgical    Comment: Hysterectomy  Other Topics Concern   Not on file  Social History Narrative   Not on file   Social Drivers of Health   Financial Resource Strain: Not on file  Food Insecurity: No Food Insecurity (11/09/2023)   Hunger Vital Sign    Worried About Running Out of Food in the Last Year: Never true    Ran Out of Food in the Last Year: Never true  Transportation Needs: No Transportation Needs (11/09/2023)   PRAPARE - Administrator, Civil Service (Medical): No    Lack of Transportation (Non-Medical): No  Physical Activity: Not on file  Stress: Not on file  Social  Connections: Unknown (08/08/2023)   Social Connection and Isolation Panel    Frequency of Communication with Friends and Family: More than three times a week    Frequency of Social Gatherings with Friends and Family: More than three times a week    Attends Religious Services: Not on file    Active Member of Clubs or Organizations: Not on file    Attends Banker Meetings: Not on file    Marital Status: Living with partner  Intimate Partner Violence: Not At Risk (11/09/2023)   Humiliation, Afraid, Rape, and Kick questionnaire    Fear of Current or Ex-Partner: No    Emotionally Abused: No    Physically Abused: No    Sexually Abused: No    Family History  Problem Relation Age of Onset   Hypertension Mother    Alcohol abuse Mother    Deep vein thrombosis Mother    Alcohol abuse Father    COPD Father    Emphysema Father    Heart disease Father     Allergies  Allergen Reactions   Azithromycin Swelling and Other (See Comments)   Clindamycin  Swelling   Codeine Hives and Other (See Comments)    Nausea/dizzy    Meloxicam  Other (See Comments)    Burns up from the inside    Neurontin [Gabapentin] Other (See Comments)    Makes her pass out and not remember what happened prior to taking med.   Penicillins Anaphylaxis    Patient allergic to all cillins Has patient had a PCN reaction causing immediate rash, facial/tongue/throat swelling, SOB or lightheadedness with hypotension: Yes Has patient had a PCN reaction causing severe rash involving mucus membranes or skin necrosis: Yes Has patient had a PCN reaction that required hospitalization: No Has patient had a PCN reaction occurring within the last 10 years: Yes If all of the above answers are NO, then may proceed with Cephalosporin use.    Clindamycin /Lincomycin Swelling   Other Hives and Other (See Comments)    Berry flavored food and drinks, blue and red dye Can have Strawberry and Banana   Diphenhydramine  Hcl Rash  and Other (See Comments)    Uses dye-free; allergic to dye in regular benadryl    Doxycycline Rash   Egg-Derived Products Rash    Review of Systems  All other systems reviewed and are negative.      Objective:   BP 108/70   Pulse 99   Ht 5' 7 (1.702 m)   Wt 253 lb (114.8 kg)   LMP 08/22/2017 Comment: says she just spots.  SpO2 99%   BMI 39.63 kg/m   Vitals:   11/08/23 1343  BP: 108/70  Pulse: 99  Height: 5' 7 (1.702 m)  Weight: 253 lb (114.8 kg)  SpO2: 99%  BMI (Calculated): 39.62    Physical Exam Vitals and nursing note reviewed.  Constitutional:      Appearance: Normal appearance. She is normal weight.  HENT:     Head: Normocephalic.  Eyes:     Extraocular Movements: Extraocular movements intact.     Conjunctiva/sclera: Conjunctivae normal.     Pupils: Pupils are equal, round, and reactive to light.  Cardiovascular:     Rate and Rhythm: Normal rate.  Pulmonary:     Effort: Pulmonary effort is normal.  Neurological:     General: No focal deficit present.     Mental Status: She is alert and oriented to person, place, and time. Mental status is at baseline.  Psychiatric:        Mood and Affect: Mood normal.        Behavior: Behavior normal.        Thought Content: Thought content normal.      No results found for any visits on 11/08/23.  Recent Results (from the past 2160 hours)  Blood Culture (routine x 2)     Status: None   Collection Time: 09/01/23  8:29 PM   Specimen: BLOOD RIGHT HAND  Result Value Ref Range   Specimen Description BLOOD RIGHT HAND    Special Requests      BOTTLES DRAWN AEROBIC AND ANAEROBIC Blood Culture adequate volume   Culture      NO GROWTH 5 DAYS Performed at [facility] Lab, [address], [place], [place] [address]    Report Status 09/06/2023 FINAL   Blood Culture (routine x 2)     Status: None   Collection Time: 09/01/23  8:34 PM   Specimen: BLOOD  Result Value Ref Range   Specimen Description BLOOD RIGHT  ANTECUBITAL    Special Requests      BOTTLES DRAWN AEROBIC AND ANAEROBIC Blood Culture adequate volume   Culture      NO GROWTH 5 DAYS Performed at [facility], [address] [place], [place] [address]    Report Status 09/06/2023 FINAL   CBC with Differential     Status: Abnormal   Collection Time: 09/01/23  8:50 PM  Result Value Ref Range   WBC 25.3 (H) 4.0 - 10.5 K/uL   RBC 5.19 (H) 3.87 - 5.11 MIL/uL   Hemoglobin 15.4 (H) 12.0 - 15.0 g/dL   HCT 53.6 (H) 63.9 - 53.9 %   MCV 89.2 80.0 - 100.0 fL   MCH 29.7 26.0 - 34.0 pg   MCHC 33.3 30.0 - 36.0 g/dL   RDW 86.7 88.4 - 84.4 %   Platelets 217 150 - 400 K/uL   nRBC 0.0 0.0 - 0.2 %   Neutrophils Relative % 86 %   Neutro Abs 21.7 (H) 1.7 - 7.7 K/uL   Lymphocytes Relative 7 %   Lymphs Abs 1.7 0.7 - 4.0 K/uL   Monocytes Relative 6 %   Monocytes Absolute 1.6 (H) 0.1 - 1.0 K/uL   Eosinophils Relative 0 %   Eosinophils Absolute 0.0 0.0 - 0.5 K/uL   Basophils Relative 0 %   Basophils Absolute 0.1 0.0 - 0.1 K/uL   WBC Morphology MORPHOLOGY UNREMARKABLE    RBC Morphology MORPHOLOGY UNREMARKABLE    Smear Review Normal platelet morphology    Immature Granulocytes 1 %   Abs Immature Granulocytes 0.27 (H) 0.00 - 0.07 K/uL    Comment: Performed at [facility] Lab, [address], [place], [place] [address]  Comprehensive metabolic panel     Status: Abnormal   Collection Time: 09/01/23  8:50 PM  Result Value Ref Range   Sodium 131 (L) 135 - 145 mmol/L   Potassium 3.6 3.5 - 5.1 mmol/L   Chloride 101 98 - 111 mmol/L   CO2 18 (L) 22 - 32 mmol/L   Glucose, Bld 111 (H) 70 - 99 mg/dL    Comment: Glucose reference range applies only to samples taken after fasting for at least 8 hours.   BUN 8 6 - 20 mg/dL   Creatinine, Ser 9.09 0.44 - 1.00 mg/dL   Calcium  8.9 8.9 - 10.3 mg/dL   Total Protein 7.0 6.5 - 8.1 g/dL   Albumin  3.7 3.5 - 5.0 g/dL   AST 26 15 - 41 U/L   ALT 32 0 - 44 U/L   Alkaline Phosphatase 73 38 - 126 U/L   Total  Bilirubin 1.4 (H) 0.0 - 1.2 mg/dL   GFR, Estimated >39 >39 mL/min    Comment: (NOTE) Calculated using the CKD-EPI Creatinine Equation (2021)    Anion gap 12 5 - 15    Comment: Performed at [facility] Lab, [address] [place], [place] [address]  Troponin I (High Sensitivity)     Status: None   Collection Time: 09/01/23  8:50 PM  Result Value Ref Range   Troponin I (High Sensitivity) 5 <18 ng/L    Comment: (NOTE) Elevated high sensitivity troponin I (hsTnI) values and significant  changes  across serial measurements may suggest ACS but many other  chronic and acute conditions are known to elevate hsTnI results.  Refer to the Links section for chest pain algorithms and additional  guidance. Performed at [facility], [address], [place], [place] [address]   hCG, serum, qualitative     Status: None   Collection Time: 09/01/23  8:50 PM  Result Value Ref Range   Preg, Serum NEGATIVE NEGATIVE    Comment:        THE SENSITIVITY OF THIS METHODOLOGY IS >10 mIU/mL. Performed at [facility] Lab, [address], [place], [place] [address]   I-Stat CG4 Lactic Acid     Status: None   Collection Time: 09/01/23  8:57 PM  Result Value Ref Range   Lactic Acid, Venous 1.5 0.5 - 1.9 mmol/L  Troponin I (High Sensitivity)     Status: None   Collection Time: 09/01/23 10:45 PM  Result Value Ref Range   Troponin I (High Sensitivity) 5 <18 ng/L    Comment: (NOTE) Elevated high sensitivity troponin I (hsTnI) values and significant  changes across serial measurements may suggest ACS but many other  chronic and acute conditions are known to elevate hsTnI results.  Refer to the Links section for chest pain algorithms and additional  guidance. Performed at [facility] Lab, [address], [place], [place] [address]   I-Stat CG4 Lactic Acid     Status: None   Collection Time: 09/01/23 10:50 PM  Result Value Ref Range   Lactic Acid, Venous 0.8 0.5 - 1.9 mmol/L  HIV Antibody  (routine testing w rflx)     Status: None   Collection Time: 09/02/23  7:00 AM  Result Value Ref Range   HIV Screen 4th Generation wRfx Non Reactive Non Reactive    Comment: Performed at [facility], [address] [place], [place] [address]  Urinalysis, w/ Reflex to Culture (Infection Suspected) -Urine, Clean Catch     Status: Abnormal   Collection Time: 09/02/23  7:16 AM  Result Value Ref Range   Specimen Source URINE, CLEAN CATCH    Color, Urine YELLOW YELLOW   APPearance CLEAR CLEAR   Specific Gravity, Urine 1.038 (H) 1.005 - 1.030   pH 5.0 5.0 - 8.0   Glucose, UA >=500 (A) NEGATIVE mg/dL   Hgb urine dipstick SMALL (A) NEGATIVE   Bilirubin Urine NEGATIVE NEGATIVE   Ketones, ur 5 (A) NEGATIVE mg/dL   Protein, ur NEGATIVE NEGATIVE mg/dL   Nitrite NEGATIVE NEGATIVE   Leukocytes,Ua NEGATIVE NEGATIVE   RBC / HPF 0-5 0 - 5 RBC/hpf   WBC, UA 0-5 0 - 5 WBC/hpf    Comment:        Reflex urine culture not performed if WBC <=10, OR if Squamous epithelial cells >5. If Squamous epithelial cells >5 suggest recollection.    Bacteria, UA RARE (A) NONE SEEN   Squamous Epithelial / HPF 0-5 0 - 5 /HPF    Comment: Performed at [facility] Lab, [address] [place], [place] [address]  MRSA Next Gen by PCR, Nasal     Status: None   Collection Time: 09/02/23  7:28 AM   Specimen: Nasal Mucosa; Nasal Swab  Result Value Ref Range   MRSA by PCR Next Gen NOT DETECTED NOT DETECTED    Comment: (NOTE) The GeneXpert MRSA Assay (FDA approved for NASAL specimens only), is one component of a comprehensive MRSA colonization surveillance program. It is  not intended to diagnose MRSA infection nor to guide or monitor treatment for MRSA infections. Test performance is not FDA approved in patients less than 66 years old. Performed at [facility], [address] [place], [place] [address]   Basic metabolic panel with GFR     Status: Abnormal   Collection Time: 09/02/23 10:37 AM  Result  Value Ref Range   Sodium 133 (L) 135 - 145 mmol/L   Potassium 3.4 (L) 3.5 - 5.1 mmol/L   Chloride 106 98 - 111 mmol/L   CO2 16 (L) 22 - 32 mmol/L   Glucose, Bld 94 70 - 99 mg/dL    Comment: Glucose reference range applies only to samples taken after fasting for at least 8 hours.   BUN 6 6 - 20 mg/dL   Creatinine, Ser 9.39 0.44 - 1.00 mg/dL   Calcium  8.6 (L) 8.9 - 10.3 mg/dL   GFR, Estimated >39 >39 mL/min    Comment: (NOTE) Calculated using the CKD-EPI Creatinine Equation (2021)    Anion gap 11 5 - 15    Comment: Performed at [facility], [address] [place], [place] [address]  T4, free     Status: None   Collection Time: 09/02/23 10:37 AM  Result Value Ref Range   Free T4 1.12 0.61 - 1.12 ng/dL    Comment: (NOTE) Biotin ingestion may interfere with free T4 tests. If the results are inconsistent with the TSH level, previous test results, or the clinical presentation, then consider biotin interference. If needed, order repeat testing after stopping biotin. Performed at [facility] Lab, [address], [place], [place] [address]   TSH     Status: Abnormal   Collection Time: 09/02/23 10:37 AM  Result Value Ref Range   TSH 0.022 (L) 0.350 - 4.500 uIU/mL    Comment: Performed by a 3rd Generation assay with a functional sensitivity of <=0.01 uIU/mL. Performed at [facility], [address] [place], [place] [address]   CBC with Differential/Platelet     Status: Abnormal   Collection Time: 09/02/23 10:37 AM  Result Value Ref Range   WBC 17.9 (H) 4.0 - 10.5 K/uL   RBC 4.51 3.87 - 5.11 MIL/uL   Hemoglobin 13.4 12.0 - 15.0 g/dL   HCT 59.7 63.9 - 53.9 %   MCV 89.1 80.0 - 100.0 fL   MCH 29.7 26.0 - 34.0 pg   MCHC 33.3 30.0 - 36.0 g/dL   RDW 86.5 88.4 - 84.4 %   Platelets 172 150 - 400 K/uL   nRBC 0.0 0.0 - 0.2 %   Neutrophils Relative % 78 %   Neutro Abs 13.9 (H) 1.7 - 7.7 K/uL   Lymphocytes Relative 11 %   Lymphs Abs 2.0 0.7 - 4.0 K/uL   Monocytes  Relative 7 %   Monocytes Absolute 1.3 (H) 0.1 - 1.0 K/uL   Eosinophils Relative 3 %   Eosinophils Absolute 0.5 0.0 - 0.5 K/uL   Basophils Relative 0 %   Basophils Absolute 0.1 0.0 - 0.1 K/uL   Immature Granulocytes 1 %   Abs Immature Granulocytes 0.12 (H) 0.00 - 0.07 K/uL    Comment: Performed at [facility], [address] [place], [place] [address]  Sedimentation rate     Status: None   Collection Time: 09/02/23 10:37 AM  Result Value Ref Range   Sed Rate 18 0 - 22 mm/hr    Comment: Performed at [facility], [address], [place], [place] [address]  C-reactive protein     Status: Abnormal   Collection Time: 09/02/23 10:37 AM  Result Value Ref Range   CRP 23.6 (H) <1.0 mg/dL    Comment: Performed at [facility], [address] [place], [place] [address]  Magnesium      Status: None   Collection Time: 09/02/23 10:37 AM  Result Value Ref Range   Magnesium  1.8 1.7 - 2.4 mg/dL    Comment: Performed at [facility], [address] [place], [place] [address]  CBC with Differential/Platelet     Status: None   Collection Time: 09/03/23  2:53 PM  Result Value Ref Range   WBC 9.6 4.0 - 10.5 K/uL   RBC 4.54 3.87 - 5.11 MIL/uL   Hemoglobin 13.5 12.0 - 15.0 g/dL   HCT 59.1 63.9 - 53.9 %   MCV 89.9 80.0 - 100.0 fL   MCH 29.7 26.0 - 34.0 pg   MCHC 33.1 30.0 - 36.0 g/dL   RDW 86.5 88.4 - 84.4 %   Platelets 185 150 - 400 K/uL   nRBC 0.0 0.0 - 0.2 %   Neutrophils Relative % 65 %   Neutro Abs 6.2 1.7 - 7.7 K/uL   Lymphocytes Relative 19 %   Lymphs Abs 1.8 0.7 - 4.0 K/uL   Monocytes Relative 9 %   Monocytes Absolute 0.9 0.1 - 1.0 K/uL   Eosinophils Relative 6 %   Eosinophils Absolute 0.5 0.0 - 0.5 K/uL   Basophils Relative 1 %   Basophils Absolute 0.1 0.0 - 0.1 K/uL   Immature Granulocytes 0 %   Abs Immature Granulocytes 0.04 0.00 - 0.07 K/uL    Comment: Performed at [facility], [address] [place], [place] [address]  Comprehensive metabolic  panel with GFR     Status: Abnormal   Collection Time: 09/03/23  2:53 PM  Result Value Ref Range   Sodium 137 135 - 145 mmol/L   Potassium 3.8 3.5 - 5.1 mmol/L   Chloride 109 98 - 111 mmol/L   CO2 20 (L) 22 - 32 mmol/L   Glucose, Bld 100 (H) 70 - 99 mg/dL    Comment: Glucose reference range applies only to samples taken after fasting for at least 8 hours.   BUN 7 6 - 20 mg/dL   Creatinine, Ser 9.28 0.44 - 1.00 mg/dL   Calcium  8.8 (L) 8.9 - 10.3 mg/dL   Total Protein 6.3 (L) 6.5 - 8.1 g/dL   Albumin  2.9 (L) 3.5 - 5.0 g/dL   AST 15 15 - 41 U/L   ALT 18 0 - 44 U/L   Alkaline Phosphatase 69 38 - 126 U/L   Total Bilirubin 0.5 0.0 - 1.2 mg/dL   GFR, Estimated >39 >39 mL/min    Comment: (NOTE) Calculated using the CKD-EPI Creatinine Equation (2021)    Anion gap 8 5 - 15    Comment: Performed at [facility] Lab, [address], [place], [place] [address]  Magnesium      Status: None   Collection Time: 09/03/23  2:53 PM  Result Value Ref Range   Magnesium  1.8 1.7 - 2.4 mg/dL    Comment: Performed at [facility] Lab, [address], [place], [place] [address]  CBC with Differential/Platelet     Status: Abnormal   Collection Time: 09/04/23  3:36 AM  Result Value Ref Range   WBC 8.6 4.0 - 10.5 K/uL  RBC 4.35 3.87 - 5.11 MIL/uL   Hemoglobin 12.9 12.0 - 15.0 g/dL   HCT 60.9 63.9 - 53.9 %   MCV 89.7 80.0 - 100.0 fL   MCH 29.7 26.0 - 34.0 pg   MCHC 33.1 30.0 - 36.0 g/dL   RDW 86.7 88.4 - 84.4 %   Platelets 181 150 - 400 K/uL   nRBC 0.0 0.0 - 0.2 %   Neutrophils Relative % 58 %   Neutro Abs 5.1 1.7 - 7.7 K/uL   Lymphocytes Relative 25 %   Lymphs Abs 2.1 0.7 - 4.0 K/uL   Monocytes Relative 9 %   Monocytes Absolute 0.8 0.1 - 1.0 K/uL   Eosinophils Relative 7 %   Eosinophils Absolute 0.6 (H) 0.0 - 0.5 K/uL   Basophils Relative 1 %   Basophils Absolute 0.1 0.0 - 0.1 K/uL   Immature Granulocytes 0 %   Abs Immature Granulocytes 0.02 0.00 - 0.07 K/uL    Comment: Performed at [facility], [address], [place], [place] [address]  Comprehensive metabolic panel with GFR     Status: Abnormal   Collection Time: 09/04/23  3:36 AM  Result Value Ref Range   Sodium 137 135 - 145 mmol/L   Potassium 3.7 3.5 - 5.1 mmol/L   Chloride 110 98 - 111 mmol/L   CO2 17 (L) 22 - 32 mmol/L   Glucose, Bld 89 70 - 99 mg/dL    Comment: Glucose reference range applies only to samples taken after fasting for at least 8 hours.   BUN 6 6 - 20 mg/dL   Creatinine, Ser 9.39 0.44 - 1.00 mg/dL   Calcium  8.6 (L) 8.9 - 10.3 mg/dL   Total Protein 5.8 (L) 6.5 - 8.1 g/dL   Albumin  2.7 (L) 3.5 - 5.0 g/dL   AST 12 (L) 15 - 41 U/L   ALT 15 0 - 44 U/L   Alkaline Phosphatase 59 38 - 126 U/L   Total Bilirubin 0.9 0.0 - 1.2 mg/dL   GFR, Estimated >39 >39 mL/min    Comment: (NOTE) Calculated using the CKD-EPI Creatinine Equation (2021)    Anion gap 10 5 - 15    Comment: Performed at [facility] Lab, [address] [place], [place] [address]  Magnesium      Status: None   Collection Time: 09/04/23  3:36 AM  Result Value Ref Range   Magnesium  1.7 1.7 - 2.4 mg/dL    Comment: Performed at [facility] Lab, [address], [place], [place] [address]  CBC with Differential/Platelet     Status: None   Collection Time: 09/05/23  4:48 AM  Result Value Ref Range   WBC 7.9 4.0 - 10.5 K/uL   RBC 4.70 3.87 - 5.11 MIL/uL   Hemoglobin 13.6 12.0 - 15.0 g/dL   HCT 57.7 63.9 - 53.9 %   MCV 89.8 80.0 - 100.0 fL   MCH 28.9 26.0 - 34.0 pg   MCHC 32.2 30.0 - 36.0 g/dL   RDW 86.7 88.4 - 84.4 %   Platelets 237 150 - 400 K/uL   nRBC 0.0 0.0 - 0.2 %   Neutrophils Relative % 51 %   Neutro Abs 4.0 1.7 - 7.7 K/uL   Lymphocytes Relative 33 %   Lymphs Abs 2.6 0.7 - 4.0 K/uL   Monocytes Relative 9 %   Monocytes Absolute 0.7 0.1 - 1.0 K/uL   Eosinophils Relative 6 %   Eosinophils Absolute 0.5 0.0 -  0.5 K/uL   Basophils Relative 1 %   Basophils Absolute 0.1 0.0 - 0.1 K/uL   Immature Granulocytes 0 %   Abs  Immature Granulocytes 0.03 0.00 - 0.07 K/uL    Comment: Performed at [facility], [address], [place], [place] [address]  Comprehensive metabolic panel with GFR     Status: Abnormal   Collection Time: 09/05/23  4:48 AM  Result Value Ref Range   Sodium 138 135 - 145 mmol/L   Potassium 3.5 3.5 - 5.1 mmol/L   Chloride 108 98 - 111 mmol/L   CO2 18 (L) 22 - 32 mmol/L   Glucose, Bld 94 70 - 99 mg/dL    Comment: Glucose reference range applies only to samples taken after fasting for at least 8 hours.   BUN 6 6 - 20 mg/dL   Creatinine, Ser 9.40 0.44 - 1.00 mg/dL   Calcium  8.8 (L) 8.9 - 10.3 mg/dL   Total Protein 6.2 (L) 6.5 - 8.1 g/dL   Albumin  2.9 (L) 3.5 - 5.0 g/dL   AST 11 (L) 15 - 41 U/L   ALT 13 0 - 44 U/L   Alkaline Phosphatase 62 38 - 126 U/L   Total Bilirubin 0.7 0.0 - 1.2 mg/dL   GFR, Estimated >39 >39 mL/min    Comment: (NOTE) Calculated using the CKD-EPI Creatinine Equation (2021)    Anion gap 12 5 - 15    Comment: Performed at [facility], [address], [place], [place] [address]  Magnesium      Status: None   Collection Time: 09/05/23  4:48 AM  Result Value Ref Range   Magnesium  1.8 1.7 - 2.4 mg/dL    Comment: Performed at [facility], [address], [place], [place] [address]  Aerobic/Anaerobic Culture w Gram Stain (surgical/deep wound)     Status: None   Collection Time: 09/05/23  4:17 PM   Specimen: Wound  Result Value Ref Range   Specimen Description WOUND    Special Requests SUBSTERNAL    Gram Stain      FEW WBC PRESENT, PREDOMINANTLY PMN RARE GRAM POSITIVE COCCI IN PAIRS CRITICAL RESULT CALLED TO, READ BACK BY AND VERIFIED WITH: DR. KENNYTH [id number] @ 1807 FH    Culture      FEW STAPHYLOCOCCUS AUREUS NO ANAEROBES ISOLATED Performed at [facility], [address] [place], [place] [address]    Report Status 09/10/2023 FINAL    Organism ID, Bacteria STAPHYLOCOCCUS AUREUS       Susceptibility   Staphylococcus aureus - MIC*     CIPROFLOXACIN  <=0.5 SENSITIVE Sensitive     ERYTHROMYCIN  <=0.25 SENSITIVE Sensitive     GENTAMICIN  <=0.5 SENSITIVE Sensitive     OXACILLIN 0.5 SENSITIVE Sensitive     TETRACYCLINE <=1 SENSITIVE Sensitive     VANCOMYCIN  1 SENSITIVE Sensitive     TRIMETH /SULFA  <=10 SENSITIVE Sensitive     CLINDAMYCIN  <=0.25 SENSITIVE Sensitive     RIFAMPIN <=0.5 SENSITIVE Sensitive     Inducible Clindamycin  NEGATIVE Sensitive     LINEZOLID  2 SENSITIVE Sensitive     * FEW STAPHYLOCOCCUS AUREUS  Aerobic Culture w Gram Stain (superficial specimen)     Status: None   Collection Time: 09/05/23  4:36 PM   Specimen: Wound  Result Value Ref Range   Specimen Description WOUND    Special Requests SUBXIPHOID    Gram Stain      RARE WBC PRESENT, PREDOMINANTLY PMN NO ORGANISMS SEEN Performed  at [facility], [address] [place], [place] [address]    Culture FEW STAPHYLOCOCCUS AUREUS    Report Status 09/08/2023 FINAL    Organism ID, Bacteria STAPHYLOCOCCUS AUREUS       Susceptibility   Staphylococcus aureus - MIC*    CIPROFLOXACIN  <=0.5 SENSITIVE Sensitive     ERYTHROMYCIN  <=0.25 SENSITIVE Sensitive     GENTAMICIN  <=0.5 SENSITIVE Sensitive     OXACILLIN 0.5 SENSITIVE Sensitive     TETRACYCLINE <=1 SENSITIVE Sensitive     VANCOMYCIN  1 SENSITIVE Sensitive     TRIMETH /SULFA  <=10 SENSITIVE Sensitive     CLINDAMYCIN  <=0.25 SENSITIVE Sensitive     RIFAMPIN <=0.5 SENSITIVE Sensitive     Inducible Clindamycin  NEGATIVE Sensitive     LINEZOLID  2 SENSITIVE Sensitive     * FEW STAPHYLOCOCCUS AUREUS  Aerobic/Anaerobic Culture w Gram Stain (surgical/deep wound)     Status: None   Collection Time: 09/05/23  4:56 PM   Specimen: Wound  Result Value Ref Range   Specimen Description WOUND    Special Requests DEVICE    Gram Stain NO WBC SEEN NO ORGANISMS SEEN     Culture      NO GROWTH 5 DAYS NO ANAEROBES ISOLATED; CULTURE IN PROGRESS FOR 5 DAYS Performed at [facility], [address]  [place], [place] [address]    Report Status 09/10/2023 FINAL   Basic metabolic panel with GFR     Status: Abnormal   Collection Time: 09/19/23 10:10 AM  Result Value Ref Range   Glucose, Bld 84 65 - 99 mg/dL    Comment: .            Fasting reference interval .    BUN 14 7 - 25 mg/dL   Creat 9.24 9.49 - 9.00 mg/dL   eGFR 896 > OR = 60 fO/fpw/8.26f7   BUN/Creatinine Ratio SEE NOTE: 6 - 22 (calc)    Comment:    Not Reported: BUN and Creatinine are within    reference range. .    Sodium 139 135 - 146 mmol/L   Potassium 4.2 3.5 - 5.3 mmol/L   Chloride 108 98 - 110 mmol/L   CO2 19 (L) 20 - 32 mmol/L   Calcium  9.2 8.6 - 10.2 mg/dL  CBC with Differential/Platelet     Status: Abnormal   Collection Time: 09/19/23 10:10 AM  Result Value Ref Range   WBC 10.1 3.8 - 10.8 Thousand/uL   RBC 5.19 (H) 3.80 - 5.10 Million/uL   Hemoglobin 15.2 11.7 - 15.5 g/dL   HCT 51.8 (H) 64.9 - 54.9 %   MCV 92.7 80.0 - 100.0 fL   MCH 29.3 27.0 - 33.0 pg   MCHC 31.6 (L) 32.0 - 36.0 g/dL    Comment: For adults, a slight decrease in the calculated MCHC value (in the range of 30 to 32 g/dL) is most likely not clinically significant; however, it should be interpreted with caution in correlation with other red cell parameters and the patient's clinical condition.    RDW 13.0 11.0 - 15.0 %   Platelets 294 140 - 400 Thousand/uL   MPV 10.0 7.5 - 12.5 fL   Neutro Abs 6,100 1,500 - 7,800 cells/uL   Absolute Lymphocytes 2,879 850 - 3,900 cells/uL   Absolute Monocytes 697 200 - 950 cells/uL   Eosinophils Absolute 283 15 - 500 cells/uL   Basophils Absolute 141 0 - 200 cells/uL   Neutrophils Relative % 60.4 %   Total Lymphocyte  28.5 %   Monocytes Relative 6.9 %   Eosinophils Relative 2.8 %   Basophils Relative 1.4 %  C-reactive protein     Status: None   Collection Time: 09/19/23 10:10 AM  Result Value Ref Range   CRP <3.0 <8.0 mg/L  Sedimentation rate     Status: None   Collection Time: 09/19/23 10:10 AM   Result Value Ref Range   Sed Rate 2 0 - 20 mm/h  Aerobic/Anaerobic Culture w Gram Stain (surgical/deep wound)     Status: None   Collection Time: 10/06/23  5:56 PM   Specimen: Tissue; Wound  Result Value Ref Range   Specimen Description TISSUE CHEST    Special Requests SUPRA STERNAL    Gram Stain NO WBC SEEN NO ORGANISMS SEEN     Culture      No growth aerobically or anaerobically. Performed at [facility], [address], [place], [place] [address]    Report Status 10/11/2023 FINAL   Aerobic/Anaerobic Culture w Gram Stain (surgical/deep wound)     Status: None   Collection Time: 10/06/23  5:56 PM   Specimen: Tissue; Wound  Result Value Ref Range   Specimen Description TISSUE CHEST    Special Requests SUBXIPHOID SAMPLE B    Gram Stain NO WBC SEEN NO ORGANISMS SEEN     Culture      No growth aerobically or anaerobically. Performed at [facility], [address], [place], [place] [address]    Report Status 10/11/2023 FINAL   CBC with Differential     Status: Abnormal   Collection Time: 10/07/23  2:16 AM  Result Value Ref Range   WBC 10.0 4.0 - 10.5 K/uL   RBC 5.09 3.87 - 5.11 MIL/uL   Hemoglobin 15.5 (H) 12.0 - 15.0 g/dL   HCT 53.1 (H) 63.9 - 53.9 %   MCV 91.9 80.0 - 100.0 fL   MCH 30.5 26.0 - 34.0 pg   MCHC 33.1 30.0 - 36.0 g/dL   RDW 85.4 88.4 - 84.4 %   Platelets 281 150 - 400 K/uL   nRBC 0.0 0.0 - 0.2 %   Neutrophils Relative % 80 %   Neutro Abs 7.9 (H) 1.7 - 7.7 K/uL   Lymphocytes Relative 16 %   Lymphs Abs 1.6 0.7 - 4.0 K/uL   Monocytes Relative 4 %   Monocytes Absolute 0.4 0.1 - 1.0 K/uL   Eosinophils Relative 0 %   Eosinophils Absolute 0.0 0.0 - 0.5 K/uL   Basophils Relative 0 %   Basophils Absolute 0.0 0.0 - 0.1 K/uL   Immature Granulocytes 0 %   Abs Immature Granulocytes 0.03 0.00 - 0.07 K/uL    Comment: Performed at [facility], [address] [place], [place] [address]  Lactic acid, plasma     Status: None   Collection  Time: 10/07/23  2:16 AM  Result Value Ref Range   Lactic Acid, Venous 1.8 0.5 - 1.9 mmol/L    Comment: Performed at [facility], [address] [place], [place] [address]  Troponin I (High Sensitivity)     Status: None   Collection Time: 10/07/23  2:16 AM  Result Value Ref Range   Troponin I (High Sensitivity) 3 <18 ng/L    Comment: (NOTE) Elevated high sensitivity troponin I (hsTnI) values and significant  changes across serial measurements may suggest ACS but many other  chronic and acute conditions are known to elevate hsTnI results.  Refer to the  Links section for chest pain algorithms and additional  guidance. Performed at [facility], [address] [place], [place] [address]   Culture, blood (routine x 2)     Status: None   Collection Time: 10/07/23  2:16 AM   Specimen: BLOOD RIGHT ARM  Result Value Ref Range   Specimen Description BLOOD RIGHT ARM    Special Requests      BOTTLES DRAWN AEROBIC AND ANAEROBIC Blood Culture results may not be optimal due to an inadequate volume of blood received in culture bottles   Culture      NO GROWTH 5 DAYS Performed at [facility], [address] [place], [place] [address]    Report Status 10/12/2023 FINAL   Culture, blood (routine x 2)     Status: None   Collection Time: 10/07/23  2:16 AM   Specimen: BLOOD RIGHT ARM  Result Value Ref Range   Specimen Description BLOOD RIGHT ARM    Special Requests      BOTTLES DRAWN AEROBIC AND ANAEROBIC Blood Culture results may not be optimal due to an inadequate volume of blood received in culture bottles   Culture      NO GROWTH 5 DAYS Performed at [facility], [address] [place], [place] [address]    Report Status 10/12/2023 FINAL   Comprehensive metabolic panel with GFR     Status: Abnormal   Collection Time: 10/07/23  3:07 AM  Result Value Ref Range   Sodium 137 135 - 145 mmol/L   Potassium 3.8 3.5 - 5.1 mmol/L   Chloride 111 98 -  111 mmol/L   CO2 17 (L) 22 - 32 mmol/L   Glucose, Bld 110 (H) 70 - 99 mg/dL    Comment: Glucose reference range applies only to samples taken after fasting for at least 8 hours.   BUN 9 6 - 20 mg/dL   Creatinine, Ser 9.54 0.44 - 1.00 mg/dL   Calcium  8.9 8.9 - 10.3 mg/dL   Total Protein 7.2 6.5 - 8.1 g/dL   Albumin  4.2 3.5 - 5.0 g/dL   AST 21 15 - 41 U/L   ALT 21 0 - 44 U/L   Alkaline Phosphatase 78 38 - 126 U/L   Total Bilirubin 0.7 0.0 - 1.2 mg/dL   GFR, Estimated >39 >39 mL/min    Comment: (NOTE) Calculated using the CKD-EPI Creatinine Equation (2021)    Anion gap 9 5 - 15    Comment: Performed at [facility], [address] [place], [place] [address]  CBC with Differential     Status: Abnormal   Collection Time: 11/02/23 11:15 PM  Result Value Ref Range   WBC 10.4 4.0 - 10.5 K/uL   RBC 5.19 (H) 3.87 - 5.11 MIL/uL   Hemoglobin 15.7 (H) 12.0 - 15.0 g/dL   HCT 53.0 (H) 63.9 - 53.9 %   MCV 90.4 80.0 - 100.0 fL   MCH 30.3 26.0 - 34.0 pg   MCHC 33.5 30.0 - 36.0 g/dL   RDW 86.2 88.4 - 84.4 %   Platelets 288 150 - 400 K/uL   nRBC 0.0 0.0 - 0.2 %   Neutrophils Relative % 52 %   Neutro Abs 5.4 1.7 - 7.7 K/uL   Lymphocytes Relative 35 %   Lymphs Abs 3.6 0.7 - 4.0 K/uL   Monocytes Relative 9 %   Monocytes Absolute 0.9 0.1 - 1.0 K/uL   Eosinophils Relative 3 %   Eosinophils Absolute 0.3 0.0 -  0.5 K/uL   Basophils Relative 1 %   Basophils Absolute 0.1 0.0 - 0.1 K/uL   Immature Granulocytes 0 %   Abs Immature Granulocytes 0.03 0.00 - 0.07 K/uL    Comment: Performed at [facility], [address] [place], [place] [address]  Comprehensive metabolic panel     Status: Abnormal   Collection Time: 11/02/23 11:15 PM  Result Value Ref Range   Sodium 137 135 - 145 mmol/L   Potassium 3.3 (L) 3.5 - 5.1 mmol/L   Chloride 107 98 - 111 mmol/L   CO2 21 (L) 22 - 32 mmol/L   Glucose, Bld 109 (H) 70 - 99 mg/dL    Comment: Glucose reference range applies only to samples  taken after fasting for at least 8 hours.   BUN 8 6 - 20 mg/dL   Creatinine, Ser 9.33 0.44 - 1.00 mg/dL   Calcium  9.2 8.9 - 10.3 mg/dL   Total Protein 6.9 6.5 - 8.1 g/dL   Albumin  3.9 3.5 - 5.0 g/dL   AST 18 15 - 41 U/L   ALT 16 0 - 44 U/L   Alkaline Phosphatase 77 38 - 126 U/L   Total Bilirubin 0.5 0.0 - 1.2 mg/dL   GFR, Estimated >39 >39 mL/min    Comment: (NOTE) Calculated using the CKD-EPI Creatinine Equation (2021)    Anion gap 9 5 - 15    Comment: Performed at [facility], [address] [place], [place] [address]  Urinalysis, Routine w reflex microscopic -Urine, Clean Catch     Status: Abnormal   Collection Time: 11/03/23  2:25 AM  Result Value Ref Range   Color, Urine YELLOW (A) YELLOW   APPearance HAZY (A) CLEAR   Specific Gravity, Urine 1.028 1.005 - 1.030   pH 5.0 5.0 - 8.0   Glucose, UA >=500 (A) NEGATIVE mg/dL   Hgb urine dipstick NEGATIVE NEGATIVE   Bilirubin Urine NEGATIVE NEGATIVE   Ketones, ur NEGATIVE NEGATIVE mg/dL   Protein, ur NEGATIVE NEGATIVE mg/dL   Nitrite NEGATIVE NEGATIVE   Leukocytes,Ua NEGATIVE NEGATIVE   RBC / HPF 0-5 0 - 5 RBC/hpf   WBC, UA 0-5 0 - 5 WBC/hpf   Bacteria, UA RARE (A) NONE SEEN   Squamous Epithelial / HPF 11-20 0 - 5 /HPF   Mucus PRESENT     Comment: Performed at [facility], [address] [place], [place] [address]  CEA     Status: None   Collection Time: 11/09/23 11:22 AM  Result Value Ref Range   CEA 2.3 0.0 - 4.7 ng/mL    Comment: (NOTE)                             Nonsmokers          <3.9                             Smokers             <5.6 [employer] Electrochemiluminescence Immunoassay (ECLIA) Values obtained with different assay methods or kits cannot be used interchangeably.  Results cannot be interpreted as absolute evidence of the presence or absence of malignant disease. Performed At: [facility] [address] [place], [place] [id number] Jennette Shorter MD  Ey:[id number]   Inhibin A     Status: None   Collection Time: 11/09/23 11:22 AM  Result Value Ref  Range   Inhibin-A 28.8 pg/mL    Comment: (NOTE)                       Menstrual Phase                         Early Follicular        <34.0                         Late Follicular         <99.0                         Periovulatory       8.0-233.0                         MidLuteal              <145.0                         End Luteal             <145.0                         Postmenopausal           <4.0 Inhibin A performed by [employer] Access Automated Immunoassay methodology. Values obtained with different assay methods or kits cannot be used interchangeably. Performed At: [facility] [address] [place], [place] [id number] Jennette Shorter MD Ey:[id number]   Human Epididymis Prot 4,Serial     Status: None   Collection Time: 11/09/23 11:22 AM  Result Value Ref Range   HE4 58.5 0.0 - 63.6 pmol/L    Comment: (NOTE) [employer] Electrochemiluminescence Immunoassay (ECLIA) Values obtained with different assay methods or kits cannot be used interchangeably.  Results cannot be interpreted as absolute evidence of the presence or absence of malignant disease. Performed At: [facility] [address] [place], [place] [id number] Jennette Shorter MD Ey:[id number]   Inhibin B     Status: None   Collection Time: 11/09/23 11:22 AM  Result Value Ref Range   Inhibin B 53.7 pg/mL    Comment: (NOTE)                     Early Follicular           <261.0                     Late Follicular            <286.0                     Periovulatory              <189.0                     MidLuteal                  <164.0                     End Luteal                 <107.0  Post Menopausal            < 17.0 This test was developed and its performance characteristics determined by [facility]. It has not been cleared or approved by the Food and Drug  Administration. Inhibin B performed by [employer]TM) Enzyme Linked Immunoassay methodology. Values obtained with different assay methods or kits cannot be used interchangeably. Performed At: [facility] [address] [place], [place] [id number] Jennette Shorter MD Ey:[id number]   CA 125     Status: Abnormal   Collection Time: 11/09/23 11:22 AM  Result Value Ref Range   Cancer Antigen (CA) 125 59.2 (H) 0.0 - 38.1 U/mL    Comment: (NOTE) [employer] Electrochemiluminescence Immunoassay (ECLIA) Values obtained with different assay methods or kits cannot be used interchangeably.  Results cannot be interpreted as absolute evidence of the presence or absence of malignant disease. Performed At: [facility] [address] [place], [place] [id number] Jennette Shorter MD Ey:[id number]        Assessment & Plan Anxiety Anxiety and depression Patient stable.  Well controlled with current therapy.   Continue current meds.   Other forms of angina pectoris [facility]) Patient is seen by cardiology, who manage this condition.  She is well controlled with current therapy.   Will defer to them for further changes to plan of care.  Pelvic pain Cyst of ovary, unspecified laterality Patient is seen by GYN ONC, who manage this condition.  She is well controlled with current therapy.   Will defer to them for further changes to plan of care.     Return to be determined based on results of GYN ONC testing.   Total time spent: 30 minutes  ALAN CHRISTELLA ARRANT, FNP  11/08/2023   This document may have been prepared by [facility] Voice Recognition software and as such may include unintentional dictation errors.

## 2023-11-13 ENCOUNTER — Telehealth: Payer: Self-pay | Admitting: Student in an Organized Health Care Education/Training Program

## 2023-11-13 NOTE — Telephone Encounter (Signed)
 Long discussions about wound care and prior S-ICD implant. One of the wound care sponges for xiphoid incision dislodged. She has it covered. She has URI sx but had these previous to today for the past several days. Not sure what type of wound care product she has inserted based off her description. No other systemic sx of infxn. Explained that if she is concerned that it has migrated into the wound then this is more concerning as it can track MSSA into the wound if it was partially external. She can't locate it if it fell out. She is going to probably just go to the wound care clinic in the morning. She will keep it covered. I had recommended she get checked in the ED if she has any systemic sx of infection which she currently doesn't.

## 2023-11-14 ENCOUNTER — Ambulatory Visit: Payer: Self-pay | Admitting: Nurse Practitioner

## 2023-11-15 ENCOUNTER — Encounter: Payer: MEDICAID | Admitting: Physician Assistant

## 2023-11-15 DIAGNOSIS — T8131XA Disruption of external operation (surgical) wound, not elsewhere classified, initial encounter: Secondary | ICD-10-CM | POA: Diagnosis not present

## 2023-11-16 ENCOUNTER — Ambulatory Visit: Payer: MEDICAID

## 2023-11-16 ENCOUNTER — Other Ambulatory Visit: Payer: MEDICAID

## 2023-11-17 ENCOUNTER — Ambulatory Visit: Payer: MEDICAID | Admitting: Physician Assistant

## 2023-11-17 ENCOUNTER — Ambulatory Visit: Payer: MEDICAID

## 2023-11-17 ENCOUNTER — Ambulatory Visit
Admission: RE | Admit: 2023-11-17 | Discharge: 2023-11-17 | Disposition: A | Discharge status: Home / Self Care | Payer: MEDICAID | Source: Ambulatory Visit | Attending: Nurse Practitioner | Admitting: Nurse Practitioner

## 2023-11-17 ENCOUNTER — Telehealth: Payer: Self-pay | Admitting: Nurse Practitioner

## 2023-11-17 DIAGNOSIS — N83209 Unspecified ovarian cyst, unspecified side: Secondary | ICD-10-CM | POA: Diagnosis present

## 2023-11-17 MED ORDER — IOHEXOL 300 MG/ML  SOLN
100.0000 mL | Freq: Once | INTRAMUSCULAR | Status: AC | PRN
  Administered 2023-11-17: 100 mL via INTRAVENOUS

## 2023-11-17 NOTE — Telephone Encounter (Signed)
 Called pt to confirm CT appt - pt said that someone called her yesterday and said that her appt was at the original time - I confirmed with the pt that the time is the time that we changed it to - [facility]

## 2023-11-21 ENCOUNTER — Telehealth: Payer: Self-pay | Admitting: Family

## 2023-11-21 NOTE — Telephone Encounter (Signed)
 Called to confirm/remind patient of their appointment [facility] on [facility].   Appointment:   [x] Confirmed  [] Left mess   [] No answer/No voice mail  [] VM Full/unable to leave message  [] Phone not in service  Patient reminded to bring all medications and/or complete list.  Confirmed patient has transportation. Gave directions, instructed to utilize valet parking.

## 2023-11-21 NOTE — Progress Notes (Unsigned)
 [facility] CONSULT NOTE  Referring Physician: Orlean Alan HERO, FNP Primary Care: Orlean Alan HERO, FNP Primary Cardiologist: Lonni Hanson, MD  Chief Complaint: shortness of breath   HPI:  Elizabeth Spencer is a 38 y.o. female with a history of ICM, HFrEF s/p SQ-ICD with subsequent extraction, CAD s/p CABG x 1, HTN, HLD, obesity, DM II, asthma, migraines, hypothyroidism, PTSD, anxiety and previous cocaine use (05/24).   Patient presented to ED in May of 2024 with chest pain. Had LHC which showed hazy eccentric 90% ostial LAD stenosis concerning for acute plaque rupture, tubular 40% proximal RCA stenosis large circumflex without significant disease. EF noted to be 30-35%. She underwent single vessel CABG, LIMA-LAD on 09/02/22.   Echo 11/24 EF 30-35% -> sICD placed 08/08/23  Admitted 6/25 with ICD infection and underwent device extraction on 09/05/23. I & D of wound done 10/06/23.  Was in the ED 11/03/23 with 2 weeks of RLQ pain, N/V. Having dysuria. Pelvic U/S done showed 12.4cm cystic lesion in region of left ovary. Seen on imaging in 2024 but was 7.4cm.  CT renal study was obtained from triage and shows a normal appendix. OB/gyn consulted.   She presents today for a HF follow-up visit with a chief complaint of shortness of breath. Has associated fatigue, occasional chest pain, cough, occasional dizziness, healing wound from ICD being removed, snoring, abdominal pain from cyst and burning with urination that she feels is related to farxiga . When she stopped farxiga , the burning resolved but then returned once it was resumed. Currently seeing OB-Gyn regarding enlarging cyst.   Hasn't been taking plavix  since her device was removed. Going to the wound center for her ICD removal site.   ROS: All systems negative except what is listed in HPI, PMH and Problem List   Past Medical History:  Diagnosis Date   Aneurysm (HCC) 01/04/2014   Arthritis    Asthma    WELL CONTROLLED   Brain  aneurysm 2007   NEUROLOGY NOTE DOES NOT MENTION ANEURYSM BUT PT STATES SHE DID NOT HAVE TO HAVE SURGERY   Complication of anesthesia    FOR 1 C-SECTION PT WAS ITCHING AND VERY RED ON HER FACE   Cough, persistent 01/27/2016   Dermatitis due to sunburn 11/08/2013   Family history of adverse reaction to anesthesia    brother, neice and nephew got red in face with hives   Gallstones    GERD (gastroesophageal reflux disease)    Gonorrhea 06/20/2020   Headache    CHRONIC HEADACHES   History of chronic cough    DRY   Loss of memory 05/30/2017   Perforation of left tympanic membrane 05/17/2013   Pneumonia    PTSD (post-traumatic stress disorder)    PTSD (post-traumatic stress disorder)    Thyroid  condition    PT WAS JUST TOLD ON 08-25-17 THAT SHE HAS A THYROID  PROBLEM AND IS GOING TO F/U WITH ENDOCRINOLOGIST IN 2 WEEKS   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 (Patient not taking: Reported on 11/09/2023)     ALPRAZolam  (ALPRAZOLAM  XR) 0.5 MG 24 hr tablet TAKE 1 TABLET BY MOUTH TWICE DAILY AS NEEDED FOR ANXIETY OR SLEEP 60 tablet 2   aspirin  81 MG chewable tablet Chew 1 tablet (81 mg total) by mouth daily. 90 tablet 0   atorvastatin  (LIPITOR) 40 MG tablet  Take 1 tablet (40 mg total) by mouth daily. 90 tablet 3   Biotin w/ Vitamins C & E (HAIR SKIN & NAILS GUMMIES PO) Take 1 tablet by mouth daily. Biotin     cetirizine  (ZYRTEC ) 10 MG tablet Take 1 tablet (10 mg total) by mouth daily. (Patient taking differently: Take 10 mg by mouth at bedtime.) 90 tablet 1   clopidogrel  (PLAVIX ) 75 MG tablet Take 1 tablet (75 mg total) by mouth daily. 90 tablet 3   dapagliflozin  propanediol (FARXIGA ) 10 MG TABS tablet Take 1 tablet (10 mg total) by mouth daily before breakfast. 90 tablet 3   diphenhydrAMINE  (BENADRYL ) 25 MG tablet Take 50 mg by mouth every  6 (six) hours as needed (allergic reaction). Dye and glutin free     EPIPEN  2-PAK 0.3 MG/0.3ML SOAJ injection INJECT INTO THIGH MUSCLE THROUGH CLOTHES AS NEEDED SEVERE ALLERGIC REACTION 2 each 1   fluconazole  (DIFLUCAN ) 150 MG tablet Take 1 tablet (150 mg total) by mouth daily. 5 tablet 0   fluticasone  (FLONASE ) 50 MCG/ACT nasal spray 1 spray by Each Nare route daily. (Patient taking differently: Place 1 spray into both nostrils daily as needed (During the summer).) 16 g 6   furosemide  (LASIX ) 20 MG tablet Take 1 tablet (20 mg total) by mouth daily as needed. 30 tablet 2   linezolid  (ZYVOX ) 600 MG tablet Take 1 tablet (600 mg total) by mouth 2 (two) times daily. 28 tablet 0   methimazole  (TAPAZOLE ) 10 MG tablet Take 1 tablet (10 mg total) by mouth daily. (Patient taking differently: Take 15 mg by mouth daily. Total 15 mg) 90 tablet 1   metoprolol  succinate (TOPROL  XL) 25 MG 24 hr tablet Take 1 tablet (25 mg total) by mouth daily. 90 tablet 3   montelukast  (SINGULAIR ) 10 MG tablet Take 1 tablet (10 mg total) by mouth at bedtime. 90 tablet 1   naloxone  (NARCAN ) nasal spray 4 mg/0.1 mL Place 1 spray into the nose Once PRN for up to 1 dose (opioid overdose). 2 each 0   omeprazole  (PRILOSEC) 20 MG capsule Take 1 capsule (20 mg total) by mouth 2 (two) times daily. 180 capsule 1   ondansetron  (ZOFRAN -ODT) 4 MG disintegrating tablet Take 1 tablet (4 mg total) by mouth every 6 (six) hours as needed for nausea or vomiting. 20 tablet 0   spironolactone  (ALDACTONE ) 25 MG tablet Take 1 tablet (25 mg total) by mouth daily. 90 tablet 3   traMADol  HCl 75 MG TABS Take 75 mg by mouth every 8 (eight) hours as needed (for severe pain). (Patient not taking: Reported on 11/09/2023) 42 tablet 0   Ubrogepant  100 MG TABS Take 1 tablet by mouth See admin instructions. Take one tablet by mouth at onset of migraine. May take an additional tablet two hours later if migraine persists.  No more than 2 tablets in 24 hours.     No  current facility-administered medications for this visit.    Allergies  Allergen Reactions   Azithromycin Swelling and Other (See Comments)   Clindamycin  Swelling   Codeine Hives and Other (See Comments)    Nausea/dizzy    Meloxicam  Other (See Comments)    Burns up from the inside    Neurontin [Gabapentin] Other (See Comments)    Makes her pass out and not remember what happened prior to taking med.   Penicillins Anaphylaxis    Patient allergic to all cillins Has patient had a PCN reaction causing immediate rash, facial/tongue/throat swelling, SOB  or lightheadedness with hypotension: Yes Has patient had a PCN reaction causing severe rash involving mucus membranes or skin necrosis: Yes Has patient had a PCN reaction that required hospitalization: No Has patient had a PCN reaction occurring within the last 10 years: Yes If all of the above answers are NO, then may proceed with Cephalosporin use.    Clindamycin /Lincomycin Swelling   Other Hives and Other (See Comments)    Berry flavored food and drinks, blue and red dye Can have Strawberry and Banana   Diphenhydramine  Hcl Rash and Other (See Comments)    Uses dye-free; allergic to dye in regular benadryl    Doxycycline Rash   Egg-Derived Products Rash      Social History   Socioeconomic History   Marital status: Single    Spouse name: Not on file   Number of children: Not on file   Years of education: Not on file   Highest education level: Not on file  Occupational History   Not on file  Tobacco Use   Smoking status: Every Day    Current packs/day: 0.50    Average packs/day: 0.5 packs/day for 18.0 years (9.0 ttl pk-yrs)    Types: Cigarettes    Passive exposure: Past   Smokeless tobacco: Never  Vaping Use   Vaping status: Never Used  Substance and Sexual Activity   Alcohol use: Yes    Alcohol/week: 0.0 standard drinks of alcohol    Comment: occasionally   Drug use: Not Currently    Types: Marijuana, Cocaine    Sexual activity: Yes    Partners: Male    Birth control/protection: Surgical    Comment: Hysterectomy  Other Topics Concern   Not on file  Social History Narrative   Not on file   Social Drivers of Health   Financial Resource Strain: Not on file  Food Insecurity: No Food Insecurity (11/09/2023)   Hunger Vital Sign    Worried About Running Out of Food in the Last Year: Never true    Ran Out of Food in the Last Year: Never true  Transportation Needs: No Transportation Needs (11/09/2023)   PRAPARE - Administrator, Civil Service (Medical): No    Lack of Transportation (Non-Medical): No  Physical Activity: Not on file  Stress: Not on file  Social Connections: Unknown (08/08/2023)   Social Connection and Isolation Panel    Frequency of Communication with Friends and Family: More than three times a week    Frequency of Social Gatherings with Friends and Family: More than three times a week    Attends Religious Services: Not on file    Active Member of Clubs or Organizations: Not on file    Attends Banker Meetings: Not on file    Marital Status: Living with partner  Intimate Partner Violence: Not At Risk (11/09/2023)   Humiliation, Afraid, Rape, and Kick questionnaire    Fear of Current or Ex-Partner: No    Emotionally Abused: No    Physically Abused: No    Sexually Abused: No      Family History  Problem Relation Age of Onset   Hypertension Mother    Alcohol abuse Mother    Deep vein thrombosis Mother    Alcohol abuse Father    COPD Father    Emphysema Father    Heart disease Father    Vitals:   11/22/23 1431  BP: (!) 115/94  Pulse: 92  SpO2: 98%  Weight: 252 lb (114.3 kg)  Wt Readings from Last 3 Encounters:  11/22/23 252 lb (114.3 kg)  11/09/23 253 lb 3.2 oz (114.9 kg)  11/08/23 253 lb (114.8 kg)   Lab Results  Component Value Date   CREATININE 0.66 11/02/2023   CREATININE 0.45 10/07/2023   CREATININE 0.75 09/19/2023    PHYSICAL  EXAM:  General: Well appearing.  Cor: No JVD. Regular rhythm, rate.  Lungs: clear, anterior chest wall wound bandaged Abdomen: soft, tender, nondistended. Extremities: no edema Neuro:. Affect pleasant    ASSESSMENT & PLAN:  1. Ischemic cardiomyopathy with chronic systolic HF - CABG with LIMA-LAD on 09/02/22.  - NYHA  II-III - Volume status ok - weight up 2 pounds from last visit here 3 months ago - Echo 02/24/23: EF 30-35%, G1DD, normal RV, moderate MR - [place] S-ICD implanted 08/08/23 - explanted 6/25 for wound infection - will stop farxiga , wait 2 days and then begin jardiance  and see if urinary burning re-occurs. 30 day jardiance  voucher provided - continue furosemide  20mg  daily PRN - continue metoprolol  succinate 25mg  daily - continue spironolactone  25mg  daily - unable to tolerate losartan  due to hypotension - BP has been too low in the past for further GDMT - Can consider ivabradine down the road as needed - instructions given for her Itamar sleep study so we can rule out sleep apnea - BMET 11/02/23 reviewed: sodium 137, potassium 3.3, creatinine 0.66 & GFR >60. Wanted to do BMET today but she declined as she's planning on getting lab work done at PCP office week and wants to combine needle sticks  2. ICD device infection - device explanted 6/25 - abx per ID service - 10/06/23 I&D of the suprasternal and subxiphoid incisions done - saw EP Anice) 07/25 - seen at wound center 11/15/23  3. CAD- - saw cardiology Dene) 10/24 - CABG with LIMA-LAD on 09/02/22.  - No s/s angina - has been off clopidogrel  for ~ 2 months. Instructed to follow up with cardiology regarding this.  - continue ASA 81mg  daily   4. Obesity - patient agreeable to GLP1 if insurance covers. Denies history of medullary thyroid  cancer - Pharm MARYLN Shove) will start the PA - saw PCP Nickey) 08/25  5. Pre-Diabetes - A1c 07/25/23 was 5.4%   Return in 1 month sooner if needed.   Elizabeth DELENA Class, FNP   7:28 PM

## 2023-11-22 ENCOUNTER — Ambulatory Visit: Payer: MEDICAID | Attending: Family | Admitting: Family

## 2023-11-22 ENCOUNTER — Encounter (HOSPITAL_COMMUNITY): Payer: Self-pay | Admitting: Internal Medicine

## 2023-11-22 ENCOUNTER — Telehealth: Payer: Self-pay

## 2023-11-22 ENCOUNTER — Other Ambulatory Visit (HOSPITAL_COMMUNITY): Payer: Self-pay

## 2023-11-22 ENCOUNTER — Encounter: Payer: Self-pay | Admitting: Family

## 2023-11-22 VITALS — BP 115/94 | HR 92 | Wt 252.0 lb

## 2023-11-22 DIAGNOSIS — I255 Ischemic cardiomyopathy: Secondary | ICD-10-CM | POA: Diagnosis not present

## 2023-11-22 DIAGNOSIS — I110 Hypertensive heart disease with heart failure: Secondary | ICD-10-CM | POA: Diagnosis not present

## 2023-11-22 DIAGNOSIS — E66812 Obesity, class 2: Secondary | ICD-10-CM | POA: Diagnosis not present

## 2023-11-22 DIAGNOSIS — E039 Hypothyroidism, unspecified: Secondary | ICD-10-CM | POA: Insufficient documentation

## 2023-11-22 DIAGNOSIS — Z79899 Other long term (current) drug therapy: Secondary | ICD-10-CM | POA: Diagnosis not present

## 2023-11-22 DIAGNOSIS — Z7984 Long term (current) use of oral hypoglycemic drugs: Secondary | ICD-10-CM | POA: Diagnosis not present

## 2023-11-22 DIAGNOSIS — F1490 Cocaine use, unspecified, uncomplicated: Secondary | ICD-10-CM | POA: Diagnosis not present

## 2023-11-22 DIAGNOSIS — F4310 Post-traumatic stress disorder, unspecified: Secondary | ICD-10-CM | POA: Diagnosis not present

## 2023-11-22 DIAGNOSIS — I2510 Atherosclerotic heart disease of native coronary artery without angina pectoris: Secondary | ICD-10-CM | POA: Diagnosis not present

## 2023-11-22 DIAGNOSIS — E119 Type 2 diabetes mellitus without complications: Secondary | ICD-10-CM | POA: Diagnosis not present

## 2023-11-22 DIAGNOSIS — E785 Hyperlipidemia, unspecified: Secondary | ICD-10-CM | POA: Diagnosis not present

## 2023-11-22 DIAGNOSIS — J45909 Unspecified asthma, uncomplicated: Secondary | ICD-10-CM | POA: Insufficient documentation

## 2023-11-22 DIAGNOSIS — E669 Obesity, unspecified: Secondary | ICD-10-CM | POA: Insufficient documentation

## 2023-11-22 DIAGNOSIS — I5022 Chronic systolic (congestive) heart failure: Secondary | ICD-10-CM | POA: Diagnosis not present

## 2023-11-22 DIAGNOSIS — Z6839 Body mass index (BMI) 39.0-39.9, adult: Secondary | ICD-10-CM | POA: Diagnosis not present

## 2023-11-22 DIAGNOSIS — T827XXD Infection and inflammatory reaction due to other cardiac and vascular devices, implants and grafts, subsequent encounter: Secondary | ICD-10-CM

## 2023-11-22 DIAGNOSIS — T827XXS Infection and inflammatory reaction due to other cardiac and vascular devices, implants and grafts, sequela: Secondary | ICD-10-CM | POA: Insufficient documentation

## 2023-11-22 DIAGNOSIS — R7303 Prediabetes: Secondary | ICD-10-CM | POA: Diagnosis not present

## 2023-11-22 DIAGNOSIS — F17210 Nicotine dependence, cigarettes, uncomplicated: Secondary | ICD-10-CM | POA: Insufficient documentation

## 2023-11-22 DIAGNOSIS — Z951 Presence of aortocoronary bypass graft: Secondary | ICD-10-CM | POA: Diagnosis not present

## 2023-11-22 DIAGNOSIS — F419 Anxiety disorder, unspecified: Secondary | ICD-10-CM | POA: Insufficient documentation

## 2023-11-22 DIAGNOSIS — R0602 Shortness of breath: Secondary | ICD-10-CM | POA: Diagnosis present

## 2023-11-22 MED ORDER — EMPAGLIFLOZIN 10 MG PO TABS: 10.0000 mg | ORAL_TABLET | Freq: Every day | ORAL | 5 refills | Status: DC

## 2023-11-22 NOTE — Patient Instructions (Signed)
 Medication Changes:  STOP FARXIGA   START JARDIANCE    Follow-Up in: 1 MONTH WITH TINA HACKNEY, FNP.   Thank you for choosing [facility].    At the [facility], you and your health needs are our priority. We have a designated team specialized in the treatment of Heart Failure. This Care Team includes your primary Heart Failure Specialized Cardiologist (physician), Advanced Practice Providers (APPs- Physician Assistants and Nurse Practitioners), and Pharmacist who all work together to provide you with the care you need, when you need it.   You may see any of the following providers on your designated Care Team at your next follow up:  Dr. Toribio Fuel Dr. Ezra Shuck Dr. Ria Commander Dr. Morene Brownie Ellouise Class, FNP Jaun Bash, RPH-CPP  Please be sure to bring in all your medications bottles to every appointment.   Need to Contact Us :  If you have any questions or concerns before your next appointment please send us  a message through [place] or call our office at [phone].    TO LEAVE A MESSAGE FOR THE NURSE SELECT OPTION 2, PLEASE LEAVE A MESSAGE INCLUDING: YOUR NAME DATE OF BIRTH CALL BACK NUMBER REASON FOR CALL**this is important as we prioritize the call backs  YOU WILL RECEIVE A CALL BACK THE SAME DAY AS LONG AS YOU CALL BEFORE 4:00 PM

## 2023-11-23 ENCOUNTER — Other Ambulatory Visit (HOSPITAL_COMMUNITY): Payer: Self-pay

## 2023-11-23 ENCOUNTER — Other Ambulatory Visit: Payer: Self-pay

## 2023-11-23 ENCOUNTER — Inpatient Hospital Stay (HOSPITAL_BASED_OUTPATIENT_CLINIC_OR_DEPARTMENT_OTHER): Payer: MEDICAID | Admitting: Obstetrics and Gynecology

## 2023-11-23 ENCOUNTER — Encounter: Payer: Self-pay | Admitting: Obstetrics and Gynecology

## 2023-11-23 VITALS — BP 134/96 | HR 109 | Temp 98.6°F | Resp 19 | Wt 252.1 lb

## 2023-11-23 DIAGNOSIS — Z90710 Acquired absence of both cervix and uterus: Secondary | ICD-10-CM | POA: Diagnosis not present

## 2023-11-23 DIAGNOSIS — R102 Pelvic and perineal pain: Secondary | ICD-10-CM | POA: Diagnosis not present

## 2023-11-23 DIAGNOSIS — F17210 Nicotine dependence, cigarettes, uncomplicated: Secondary | ICD-10-CM

## 2023-11-23 DIAGNOSIS — N83209 Unspecified ovarian cyst, unspecified side: Secondary | ICD-10-CM | POA: Insufficient documentation

## 2023-11-23 DIAGNOSIS — Z7189 Other specified counseling: Secondary | ICD-10-CM

## 2023-11-23 DIAGNOSIS — N83202 Unspecified ovarian cyst, left side: Secondary | ICD-10-CM | POA: Diagnosis not present

## 2023-11-23 DIAGNOSIS — N83201 Unspecified ovarian cyst, right side: Secondary | ICD-10-CM

## 2023-11-23 NOTE — Progress Notes (Signed)
 Gynecologic Oncology Interval Visit   Referring Provider: Dr Verdon  Chief Complaint: Pelvic Mass  Subjective:  Elizabeth Spencer is a 38 y.o. G60P3003 female with a past medical history of bipolar affective disorder, CAD s/p CABG, ischemic cardiomyopathy with ICD implant and explant, systolic CHF with EF 30-35%, cocaine use, and factor V Leiden with DVT of LUE, who is seen in consultation from Dr. Verdon for enlarging pelvic cystic mass.    In interim, she has had blood work and imaging performed.  She's worried about risks of surgery. Complains of mid-line to right pelvic pain.   11/17/2023 CT C/A/P A cystic mass within the central pelvis, favored to arise from the right ovary, measures 8.7 by 9.5 cm on 101/2. Compare 9.9 x 9.4 cm when measured in a similar fashion on the prior. 9.4 cm craniocaudal today on sagittal image 81 versus 9.0 cm on the prior when remeasured in a similar fashion.  Cancer Antigen (CA) 125 59.2 High    HE4 58.5   Inhibin B 53.7   Inhibin-A 28.8   CEA 2.3    Gynecologic History:  She has a history of hysterectomy for abnormal uterine bleeding and chronic pelvic pain with Dr. Lake in June 2019 with vaginal cuff dehiscence post repair 11/2017.   Pap- 08/20/17- NILM HPV Negative.  Endometrial biopsy 09/02/17-secretory endometrium 12/14/2016-preoperative ultrasound was reported as normal. 09/02/2017-diagnostic laparoscopy showed some mild adhesive disease in the left adnexa excised prior tubal ligation.  12/12/22- CT Abdomen Pelvis w/ Contrast w/ Dr Jordis - 7.4 cm simple appearing left ovarian cyst. Recommend follow-up pelvic US  in 3-6 months. - Moderate cholelithiasis and sludge with findings suggesting gallbladder wall thickening. Findings are concerning for acute cholecystitis, although recent right upper quadrant ultrasound and HIDA scan suggest no evidence of acute cholecystitis. She underwent cholecystectomy and did not have follow-up ultrasound.  For  surgical evaluation.  Given her medical issues and benign appearance on imaging recommended close surveillance.    Problem List: Patient Active Problem List   Diagnosis Date Noted   Cyst of ovary 11/23/2023   DDD (degenerative disc disease), lumbar 11/09/2023   Postoperative infection 09/02/2023   Surgical site infection 09/02/2023   Tachycardia 09/02/2023   Sepsis (HCC) 09/01/2023   Cardiac defibrillator in situ 08/30/2023   Chronic systolic heart failure (HCC) 08/08/2023   Vitamin D  deficiency, unspecified 05/08/2023   Acute deep vein thrombosis (DVT) of brachial vein of left upper extremity (HCC) 01/04/2023   Cholecystitis, acute 12/12/2022   Cholelithiasis 12/11/2022   Dyslipidemia 12/11/2022   Hyperthyroidism 12/11/2022   Anxiety and depression 12/11/2022   Asthma, chronic 12/11/2022   Coronary artery disease 12/11/2022   Biliary colic 12/11/2022   Obstructive sleep apnea 10/24/2022   Calculus of gallbladder with acute on chronic cholecystitis without obstruction 10/24/2022   Insomnia 10/24/2022   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   NSTEMI (non-ST elevated myocardial infarction) (HCC) 09/01/2022   Chest pain 08/31/2022   Gastroesophageal reflux disease 12/27/2019   Multinodular goiter 08/08/2018   Vaginal cuff dehiscence 11/05/2017   S/P laparoscopic hysterectomy 09/29/2017   Chronic tension-type headache, not intractable 05/30/2017   Migraine without aura and without status migrainosus, not intractable 05/30/2017   Arthropathy of lumbar facet joint 04/08/2017   Anxiety 11/08/2013   Obesity 11/08/2013   Bipolar affective disorder (HCC) 05/17/2013   Depression 08/18/2012   Pain of upper abdomen 07/31/2012   Past Medical History: Past Medical History:  Diagnosis Date   Aneurysm (HCC) 01/04/2014  Arthritis    Asthma    WELL CONTROLLED   Brain aneurysm 2007   NEUROLOGY NOTE DOES NOT MENTION ANEURYSM BUT PT STATES SHE DID NOT HAVE TO HAVE SURGERY   Complication of  anesthesia    FOR 1 C-SECTION PT WAS ITCHING AND VERY RED ON HER FACE   Cough, persistent 01/27/2016   Dermatitis due to sunburn 11/08/2013   Family history of adverse reaction to anesthesia    brother, neice and nephew got red in face with hives   Gallstones    GERD (gastroesophageal reflux disease)    Gonorrhea 06/20/2020   Headache    CHRONIC HEADACHES   History of chronic cough    DRY   Loss of memory 05/30/2017   Perforation of left tympanic membrane 05/17/2013   Pneumonia    PTSD (post-traumatic stress disorder)    PTSD (post-traumatic stress disorder)    Thyroid  condition    PT WAS JUST TOLD ON 08-25-17 THAT SHE HAS A THYROID  PROBLEM AND IS GOING TO F/U WITH ENDOCRINOLOGIST IN 2 WEEKS   Trichomonas vaginalis (TV) infection 06/20/2020   Past Surgical History: Past Surgical History:  Procedure Laterality Date   ABDOMINAL HYSTERECTOMY     CESAREAN SECTION     x3   CORONARY ARTERY BYPASS GRAFT N/A 09/02/2022   Procedure: CORONARY ARTERY BYPASS GRAFTING (CABG) X 1 USING LEFT INTERNAL MAMMARY ARTERY;  Surgeon: Lucas Dorise POUR, MD;  Location: [facility];  Service: Open Heart Surgery;  Laterality: N/A;   CYSTOSCOPY N/A 09/29/2017   Procedure: CYSTOSCOPY;  Surgeon: Lake Read, MD;  Location: [facility];  Service: Gynecology;  Laterality: N/A;   CYSTOSCOPY N/A 11/05/2017   Procedure: CYSTOSCOPY;  Surgeon: Lake Read, MD;  Location: [facility];  Service: Gynecology;  Laterality: N/A;   HYSTEROSCOPY WITH D & C N/A 09/01/2017   Procedure: DILATATION AND CURETTAGE /HYSTEROSCOPY;  Surgeon: Lake Read, MD;  Location: [facility];  Service: Gynecology;  Laterality: N/A;   IMPLANT POCKET INCISION & DRAINAGE N/A 10/06/2023   Procedure: IMPLANT POCKET INCISION & DRAINAGE;  Surgeon: Kennyth Chew, MD;  Location: [facility];  Service: Cardiovascular;  Laterality: N/A;   LAPAROSCOPY N/A 09/01/2017   Procedure: LAPAROSCOPY OPERATIVE with biopsy;  Surgeon: Lake Read, MD;   Location: [facility];  Service: Gynecology;  Laterality: N/A;   LAPAROSCOPY N/A 11/05/2017   Procedure: LAPAROSCOPY DIAGNOSTIC;  Surgeon: Lake Read, MD;  Location: [facility];  Service: Gynecology;  Laterality: N/A;   LEFT HEART CATH AND CORONARY ANGIOGRAPHY N/A 09/01/2022   Procedure: LEFT HEART CATH AND CORONARY ANGIOGRAPHY;  Surgeon: Mady Bruckner, MD;  Location: [facility];  Service: Cardiovascular;  Laterality: N/A;   REPAIR VAGINAL CUFF N/A 11/05/2017   Procedure: REPAIR VAGINAL CUFF;  Surgeon: Lake Read, MD;  Location: [facility];  Service: Gynecology;  Laterality: N/A;   SUBQ ICD IMPLANT N/A 08/08/2023   Procedure: SUBQ ICD IMPLANT;  Surgeon: Kennyth Chew, MD;  Location: [facility] INVASIVE CV LAB;  Service: Cardiovascular;  Laterality: N/A;   SUBQ ICD REVISION N/A 08/09/2023   Procedure: SUBQ ICD REVISION;  Surgeon: Kennyth Chew, MD;  Location: [facility] INVAS[facility] CV LAB;  Service: Cardiovascular;  Laterality: N/A;   SUBQ ICD REVISION N/A 09/05/2023   Procedure: SUBQ ICD REVISION;  Surgeon: Kennyth Chew, MD;  Location: [facility] INVASIVE CV LAB;  Service: Cardiovascular;  Laterality: N/A;   TEE WITHOUT CARDIOVERSION N/A 09/02/2022   Procedure: TRANSESOPHAGEAL ECHOCARDIOGRAM;  Surgeon: Lucas Dorise POUR, MD;  Location: [facility];  Service: Open Heart Surgery;  Laterality: N/A;   TOTAL LAPAROSCOPIC HYSTERECTOMY WITH SALPINGECTOMY Bilateral 09/29/2017   Procedure: TOTAL LAPAROSCOPIC HYSTERECTOMY WITH SALPINGECTOMY;  Surgeon: Lake Read, MD;  Location: [facility];  Service: Gynecology;  Laterality: Bilateral;   Past Gynecologic History:  Post hysterectomy Denies history of HRT use.  Denies abnormal Pap. Unsure of STD history. Sexually active.   OB History:  OB History  Gravida Para Term Preterm AB Living  3 3 3   3   SAB IAB Ectopic Multiple Live Births      3    # Outcome Date GA Lbr Len/2nd Weight Sex Type Anes PTL Lv  3 Term 12/13/07     CS-LTranv     2 Term 10/10/06      CS-LTranv     1 Term 10/11/05     CS-LTranv       Family History: Family History  Problem Relation Age of Onset   Hypertension Mother    Alcohol abuse Mother    Deep vein thrombosis Mother    Alcohol abuse Father    COPD Father    Emphysema Father    Heart disease Father     Social History: Social History   Socioeconomic History   Marital status: Single    Spouse name: Not on file   Number of children: Not on file   Years of education: Not on file   Highest education level: Not on file  Occupational History   Not on file  Tobacco Use   Smoking status: Every Day    Current packs/day: 0.50    Average packs/day: 0.5 packs/day for 18.0 years (9.0 ttl pk-yrs)    Types: Cigarettes    Passive exposure: Past   Smokeless tobacco: Never  Vaping Use   Vaping status: Never Used  Substance and Sexual Activity   Alcohol use: Yes    Alcohol/week: 0.0 standard drinks of alcohol    Comment: occasionally   Drug use: Not Currently    Types: Marijuana, Cocaine   Sexual activity: Yes    Partners: Male    Birth control/protection: Surgical    Comment: Hysterectomy  Other Topics Concern   Not on file  Social History Narrative   Not on file   Social Drivers of Health   Financial Resource Strain: Not on file  Food Insecurity: No Food Insecurity (11/09/2023)   Hunger Vital Sign    Worried About Running Out of Food in the Last Year: Never true    Ran Out of Food in the Last Year: Never true  Transportation Needs: No Transportation Needs (11/09/2023)   PRAPARE - Administrator, Civil Service (Medical): No    Lack of Transportation (Non-Medical): No  Physical Activity: Not on file  Stress: Not on file  Social Connections: Unknown (08/08/2023)   Social Connection and Isolation Panel    Frequency of Communication with Friends and Family: More than three times a week    Frequency of Social Gatherings with Friends and Family: More than three times a week    Attends Religious  Services: Not on file    Active Member of Clubs or Organizations: Not on file    Attends Banker Meetings: Not on file    Marital Status: Living with partner  Intimate Partner Violence: Not At Risk (11/09/2023)   Humiliation, Afraid, Rape, and Kick questionnaire    Fear of Current or Ex-Partner: No    Emotionally Abused: No    Physically Abused:  No    Sexually Abused: No    Allergies: Allergies  Allergen Reactions   Azithromycin Swelling and Other (See Comments)   Clindamycin  Swelling   Codeine Hives and Other (See Comments)    Nausea/dizzy    Meloxicam  Other (See Comments)    Burns up from the inside    Neurontin [Gabapentin] Other (See Comments)    Makes her pass out and not remember what happened prior to taking med.   Penicillins Anaphylaxis    Patient allergic to all cillins Has patient had a PCN reaction causing immediate rash, facial/tongue/throat swelling, SOB or lightheadedness with hypotension: Yes Has patient had a PCN reaction causing severe rash involving mucus membranes or skin necrosis: Yes Has patient had a PCN reaction that required hospitalization: No Has patient had a PCN reaction occurring within the last 10 years: Yes If all of the above answers are NO, then may proceed with Cephalosporin use.    Clindamycin /Lincomycin Swelling   Other Hives and Other (See Comments)    Berry flavored food and drinks, blue and red dye Can have Strawberry and Banana   Diphenhydramine  Hcl Rash and Other (See Comments)    Uses dye-free; allergic to dye in regular benadryl    Doxycycline Rash   Egg-Derived Products Rash    Current Medications: Current Outpatient Medications  Medication Sig Dispense Refill   ALPRAZolam  (ALPRAZOLAM  XR) 0.5 MG 24 hr tablet TAKE 1 TABLET BY MOUTH TWICE DAILY AS NEEDED FOR ANXIETY OR SLEEP 60 tablet 2   atorvastatin  (LIPITOR) 40 MG tablet Take 1 tablet (40 mg total) by mouth daily. 90 tablet 3   Biotin w/ Vitamins C & E (HAIR  SKIN & NAILS GUMMIES PO) Take 1 tablet by mouth daily. Biotin     cetirizine  (ZYRTEC ) 10 MG tablet Take 1 tablet (10 mg total) by mouth daily. (Patient taking differently: Take 10 mg by mouth at bedtime.) 90 tablet 1   dapagliflozin  propanediol (FARXIGA ) 10 MG TABS tablet Take 1 tablet (10 mg total) by mouth daily before breakfast. 90 tablet 3   diphenhydrAMINE  (BENADRYL ) 25 MG tablet Take 50 mg by mouth every 6 (six) hours as needed (allergic reaction). Dye and glutin free     empagliflozin  (JARDIANCE ) 10 MG TABS tablet Take 1 tablet (10 mg total) by mouth daily before breakfast. 30 tablet 5   EPIPEN  2-PAK 0.3 MG/0.3ML SOAJ injection INJECT INTO THIGH MUSCLE THROUGH CLOTHES AS NEEDED SEVERE ALLERGIC REACTION 2 each 1   fluconazole  (DIFLUCAN ) 150 MG tablet Take 1 tablet (150 mg total) by mouth daily. 5 tablet 0   fluticasone  (FLONASE ) 50 MCG/ACT nasal spray 1 spray by Each Nare route daily. (Patient taking differently: Place 1 spray into both nostrils daily as needed (During the summer).) 16 g 6   furosemide  (LASIX ) 20 MG tablet Take 1 tablet (20 mg total) by mouth daily as needed. 30 tablet 2   linezolid  (ZYVOX ) 600 MG tablet Take 1 tablet (600 mg total) by mouth 2 (two) times daily. 28 tablet 0   methimazole  (TAPAZOLE ) 10 MG tablet Take 1 tablet (10 mg total) by mouth daily. (Patient taking differently: Take 15 mg by mouth daily. Total 15 mg) 90 tablet 1   metoprolol  succinate (TOPROL  XL) 25 MG 24 hr tablet Take 1 tablet (25 mg total) by mouth daily. 90 tablet 3   montelukast  (SINGULAIR ) 10 MG tablet Take 1 tablet (10 mg total) by mouth at bedtime. 90 tablet 1   naloxone  (NARCAN ) nasal spray 4 mg/0.1  mL Place 1 spray into the nose Once PRN for up to 1 dose (opioid overdose). 2 each 0   omeprazole  (PRILOSEC) 20 MG capsule Take 1 capsule (20 mg total) by mouth 2 (two) times daily. 180 capsule 1   ondansetron  (ZOFRAN -ODT) 4 MG disintegrating tablet Take 1 tablet (4 mg total) by mouth every 6 (six)  hours as needed for nausea or vomiting. 20 tablet 0   spironolactone  (ALDACTONE ) 25 MG tablet Take 1 tablet (25 mg total) by mouth daily. 90 tablet 3   Ubrogepant  100 MG TABS Take 1 tablet by mouth See admin instructions. Take one tablet by mouth at onset of migraine. May take an additional tablet two hours later if migraine persists.  No more than 2 tablets in 24 hours.     acetaminophen  (TYLENOL ) 500 MG tablet Take 1,000 mg by mouth 2 (two) times daily. (Patient not taking: Reported on 11/23/2023)     aspirin  81 MG chewable tablet Chew 1 tablet (81 mg total) by mouth daily. (Patient not taking: Reported on 11/23/2023) 90 tablet 0   clopidogrel  (PLAVIX ) 75 MG tablet Take 1 tablet (75 mg total) by mouth daily. (Patient not taking: Reported on 11/23/2023) 90 tablet 3   traMADol  HCl 75 MG TABS Take 75 mg by mouth every 8 (eight) hours as needed (for severe pain). (Patient not taking: Reported on 11/23/2023) 42 tablet 0   No current facility-administered medications for this visit.   Review of Systems General:  fatigue Skin: no complaints Eyes: no complaints HEENT: allergies Breasts: no complaints Pulmonary: cough Cardiac: no complaints Gastrointestinal: abdominal pain, bloating, distention, decreased appetite Genitourinary/Sexual: pelvic pain Ob/Gyn: no complaints Musculoskeletal: no complaints Hematology: no complaints Neurologic/Psych: numbness & tingling of toes/feet   Objective:  Physical Examination:  BP (!) 134/96   Pulse (!) 109   Temp 98.6 F (37 C)   Resp 19   Wt 252 lb 1.6 oz (114.4 kg)   LMP 08/22/2017 Comment: says she just spots.  SpO2 99%   BMI 39.48 kg/m     ECOG Performance Status: 1 - Symptomatic but completely ambulatory  GENERAL: Patient is a well appearing female in no acute distress HEENT:  Sclera clear. Anicteric NODES:  Negative axillary, supraclavicular, inguinal lymph node survery LUNGS:  Clear to auscultation bilaterally.   HEART:  Regular rate and  rhythm.  ABDOMEN:  Soft, nontender.  No hernias, incisions well healed. No masses or ascites EXTREMITIES:  No peripheral edema. Atraumatic. No cyanosis SKIN:  Clear with no obvious rashes or skin changes.  NEURO:  Nonfocal. Well oriented.  Appropriate affect.  Pelvic: Exam Chaperoned by RN EGBUS: no lesions Cervix: surgically absent Vagina: no lesions, no discharge or bleeding Uterus: surgically absent Adnexa: 10 cm midline smooth mass with limited mobility    Lab Review 11/09/23 CA 125 59.2 (^) Inhibin A 28.8 Inhibin B 53.7 HE4  58.5 CEA  2.3  Radiologic Imaging: 11/17/23- CT Chest Abdomen Pelvis W Contrast   CLINICAL DATA:  Cystic left ovarian lesion.     EXAM: CT CHEST, ABDOMEN, AND PELVIS WITH CONTRAST COMPARISON:  11/02/2023 abdominopelvic CT.  10/07/2023 chest CT.   FINDINGS: CT CHEST FINDINGS   Cardiovascular: Aortic atherosclerosis. Mild cardiomegaly, without pericardial effusion. Median sternotomy for CABG. No central pulmonary embolism, on this non-dedicated study.   Mediastinum/Nodes: Thyroid  nodules of maximally 1.6 cm. No supraclavicular adenopathy.   No mediastinal or hilar adenopathy.   Lungs/Pleura: No pleural fluid. Motion degradation in the lower chest.   Subsegmental  atelectasis in both lung bases.   5 mm right lower lobe pulmonary nodule on 104/4 is similar to on the prior and present back to at least 08/31/2022, considered benign.   Musculoskeletal: Presternal skin and subcutaneous defect superiorly on 16/2. Presternal subcutaneous edema and minimal gas inferiorly including on 44/2. These are both the site of larger gas pockets on the prior.   Intact sternotomy wires.   CT ABDOMEN PELVIS FINDINGS   Hepatobiliary: Mild caudate lobe enlargement. Motion degradation continuing into the upper abdomen. Gallbladder likely surgically absent.   Pancreas: Normal, without mass or ductal dilatation.   Spleen: Normal in size, without focal abnormality.    Adrenals/Urinary Tract: Normal adrenal glands. Normal kidneys, without hydronephrosis. Normal urinary bladder.   Stomach/Bowel: Normal stomach, without wall thickening. Colonic stool burden suggests constipation. Normal terminal ileum. Normal small bowel.   Vascular/Lymphatic: Aortic atherosclerosis. No abdominopelvic adenopathy.   Reproductive: Hysterectomy.  The left ovary is  normal on 88/2.   A cystic mass within the central pelvis, favored to arise from the right ovary, measures 8.7 by 9.5 cm on 101/2. Compare 9.9 x 9.4 cm when measured in a similar fashion on the prior. 9.4 cm craniocaudal today on sagittal image 81 versus 9.0 cm on the prior when remeasured in a similar fashion.   Other: Mild pelvic floor laxity. No significant free fluid. No free intraperitoneal air.   Musculoskeletal: No acute osseous abnormality.   IMPRESSION: 1. Mild motion degradation involving the lower chest and abdomen. 2. Similar size of a pelvic cystic mass, favored to arise from the right ovary and represent benign or malignant cystic neoplasm. No evidence of metastatic disease. 3. Right lower lobe pulmonary nodules unchanged back to at least 08/31/2022, considered benign. 4. Areas of soft tissue thickening and gas in the presternal space, at the site of gas pockets on the prior exam. Likely sequelae of interval debridement. No well-defined drainable collection identified. 5. Thyroid  nodules up to 1.6 cm. Recommend thyroid  US  (ref: J Am Coll Radiol. 2015 Feb;12(2): 143-50).  6. Incidental findings, including: Aortic Atherosclerosis (ICD10-I70.0). Possible constipation.    Assessment:  Elizabeth Spencer is a 39 y.o. female who presents with enlarging pelvic mass with mural thickening now measuring 12.4 x 8.2 x 8.9 cm enlarged from ultrasound in 2020 for at which time it measured 7.4 x 5.8 x 5.3 cm.  Additionally, increased vascularity within the thickened wall.  She is status post hysterectomy for benign  disease.  Symptomatic of pain. Pre-menopausal ROMA score 11.5% (high risk), however CA125 may be due to cardiac issues and not overly concerning in a 38 y.o. Her HE4 is normal which is reasurring. Cystic mass arising from right ovary measuring 8.7 x 9.5 cm, enlarging. No evidence of metastatic disease or lymphadenopathy.   Hysterectomy for benign disease, last Pap normal and HPV negative, therefore no further Paps needed.   Medical co-morbidities complicating care: 3 prior c sections, previous abdominal surgeries, prior CABG, heart failure, obesity with BMI of 39.66, history of DVT, NSTEMI, psychiatric illness. Plan:   Problem List Items Addressed This Visit       Endocrine   Cyst of ovary - Primary   Relevant Orders   CT GUIDED NEEDLE PLACEMENT   Other Visit Diagnoses       Counseling and coordination of care       Relevant Orders   CT GUIDED NEEDLE PLACEMENT      Given her medical issues, evaluation on imaging and reassuring HE4 recommended  imaging guided drainage with removal of as fluid as possible and send for cytology.   I will plan for her to return to clinic in 2-3 months follow-up to evaluate her symptoms post drainage. The patient agrees with this approach.   The patient's diagnosis, an outline of the further diagnostic and laboratory studies which will be required, the recommendation for surgery, and alternatives were discussed with her and her accompanying family members.  All questions were answered to their satisfaction.  A total of 45 minutes were spent with the patient/family today; 90% was spent in education, counseling and coordination of care presenting.    Tinnie Dawn, DNP, AGNP-C, AOCNP [facility] [phone] (clinic)  I personally had a face to face interaction and evaluated the patient jointly with the NP, Ms. Tinnie Dawn.  I have reviewed her history and available records and have performed the key portions of the physical exam  including abdominal exam, pelvic exam with my findings confirming those documented above by the APP.  I have discussed the case with the APP and the patient.  I agree with the above documentation, assessment and plan which was fully formulated by me.  Counseling was completed by me.   I personally saw the patient and performed a substantive portion of this encounter in conjunction with the listed APP as documented above.  Angeles Isidor Constable, MD

## 2023-11-23 NOTE — Telephone Encounter (Signed)
 Prior authorization for Jardiance  has been submitted and approved. Test billing returns $4 for 90 day supply.  Key: [id number] Effective: 11/23/2023 to 11/22/2024  Rachel DEL, CPhT [facility] Phone: [phone]

## 2023-11-24 ENCOUNTER — Other Ambulatory Visit (HOSPITAL_COMMUNITY): Payer: Self-pay

## 2023-11-24 ENCOUNTER — Telehealth: Payer: Self-pay

## 2023-11-24 ENCOUNTER — Encounter (HOSPITAL_COMMUNITY): Payer: Self-pay | Admitting: Internal Medicine

## 2023-11-24 NOTE — Telephone Encounter (Signed)
   Pre-operative Risk Assessment    Patient Name: Elizabeth Spencer  DOB: 1985/07/19 MRN: [id number]   Date of last office visit: 10/25/23 FONDA KITTY, MD Date of next office visit: 12/13/23 FONDA KITTY, MD   Request for Surgical Clearance    Procedure:  CT GUIDED OVARIAN CYST DRAINAGE  Date of Surgery:  Clearance 12/06/23                                Surgeon:  NOT INDICATED  Surgeon's Group or Practice Name:  [facility]- A DEPT OF [facility]  Phone number:  [phone] Fax number:  [phone]  ATTN: RAYFIELD JASMINE   Type of Clearance Requested:   - Medical  - Pharmacy:  Hold Aspirin  and Clopidogrel  (Plavix )     Type of Anesthesia:  Not Indicated   Additional requests/questions:    Signed, Lucie DELENA Ku   11/24/2023, 1:49 PM

## 2023-11-24 NOTE — Progress Notes (Addendum)
 HPI:  Elizabeth Spencer returns for follow-up evaluation of her thyroid .  She is a 38 y.o. female who I saw for the first time in 10/19 for hyperthyroidism.  On exam, I felt a goiter so I referred her for an ultrasound.  Her ultrasound showed multiple nodules.  I therefore referred her for a thyroid  uptake and scan to see if any of the nodules were hyperfunctioning and if any of the nodules needed to be biopsied.  Her dominant right sided nodule was cold in the lower aspect so I had her come back for a biopsy.  Turns out it was all cyst fluid which is why it was cold on scan.  She had several hyperfunctioning nodules and I wanted to treat her with radioiodine but she did not want that therapy. I last saw her in 4/25. At that time, I increased her MMZ.  In July, she had to have her AICD removed due to infection.  She isn't sure if she wants a replacement.  She is also going to have GYN surgery for a cyst/mass.  She says cardiology was talking about an injection for weight loss for her.  She is currently on MMZ 15 mg daily.  She complains of tachycardia, and weight gain.  She is also concerned about her heart.  Her weight is up 3 pounds since last visit.  Review of her diet shows she avoids concentrated carbs.   She cannot tell that her thyroid  is there.  She has no anterior neck pain/swelling.  She is concerned about her thyroid .  ROS:  No chest pain.  No shortness of breath.   Medical History: Past Medical History:  Diagnosis Date  . Arthritis   . Asthma without status asthmaticus (HHS-HCC)   . Fetal alcohol syndrome (HHS-HCC)   . Migraine     Surgical History: Past Surgical History:  Procedure Laterality Date  . ABDOMINAL HYSTERECTOMY W/ PARTIAL VAGINACTOMY  09/2017  . CESAREAN SECTION      Social History:  reports that she has been smoking cigarettes. She has a 16 pack-year smoking history. She has never used smokeless tobacco. She reports that she does not currently use alcohol. She reports  current drug use. Drug: Other-see comments.  Family History: family history includes Bipolar disorder in her brother; COPD in her father; Clotting disorder in her brother and mother; Heart murmur in her father; High blood pressure (Hypertension) in her father and mother; Seizures in her brother; Thyroid  disease in her mother.  Medications: Current Outpatient Medications  Medication Sig Dispense Refill  . ALPRAZolam  (XANAX ) 0.5 MG tablet Take 0.5 mg by mouth 2 (two) times daily as needed for Sleep    . aspirin  81 MG chewable tablet Take 81 mg by mouth once daily    . atorvastatin  (LIPITOR) 40 MG tablet Take 40 mg by mouth once daily    . cetirizine  (ZYRTEC ) 10 MG tablet Take 10 mg by mouth once daily    . ciprofloxacin  HCl (CIPRO ) 500 MG tablet Take 500 mg by mouth 2 (two) times daily For 7 days    . EPINEPHrine  (EPIPEN ) 0.3 mg/0.3 mL pen injector Inject into thigh muscle through clothes PRN severe allergic reaction    . FUROsemide  (LASIX ) 20 MG tablet Take 20 mg by mouth once daily    . hydrOXYzine  (ATARAX ) 25 MG tablet Take 25 mg by mouth 3 (three) times daily as needed for Itching    . loratadine  (CLARITIN  ORAL) Take by mouth    .  losartan  (COZAAR ) 25 MG tablet Take 12.5 mg by mouth once daily    . methIMAzole  (TAPAZOLE ) 10 MG tablet Take 1.5 tablets (15 mg total) by mouth once daily 135 tablet 4  . metoprolol  SUCCinate (TOPROL -XL) 25 MG XL tablet Take 25 mg by mouth once daily    . metroNIDAZOLE  (FLAGYL ) 500 MG tablet Take 500 mg by mouth every 8 (eight) hours    . omeprazole  (PRILOSEC OTC) 20 MG EC tablet Take 20 mg by mouth 2 (two) times daily    . omeprazole  20 mg Take 1 tablet by mouth once daily  2  . apixaban  (ELIQUIS ) 5 mg tablet Take 5 mg by mouth every 12 (twelve) hours (Patient not taking: Reported on 11/24/2023)    . biotin-collagen-vit C-E-herbal 1,250 mcg-50 mg -67.5 mg-15 mg Chew Take by mouth once daily (Patient not taking: Reported on 11/24/2023)    . clopidogreL  (PLAVIX ) 75  mg tablet Take 75 mg by mouth once daily (Patient not taking: Reported on 11/24/2023)    . famotidine  (PEPCID ) 20 MG tablet Take 20 mg by mouth 2 (two) times daily (Patient not taking: Reported on 11/24/2023)    . FARXIGA  10 mg tablet Take 10 mg by mouth once daily (Patient not taking: Reported on 11/24/2023)    . naproxen  (NAPROSYN ) 500 MG tablet Take 500 mg by mouth 2 (two) times daily as needed   (Patient not taking: Reported on 11/24/2023)    . sacubitriL-valsartan (ENTRESTO ) 24-26 mg tablet Take 1 tablet by mouth 2 (two) times daily    . semaglutide  (WEGOVY ) 0.25 mg/0.5 mL pen injector Inject 0.5 mLs (0.25 mg total) subcutaneously once a week for 28 days 2 mL 0  . [START ON 12/22/2023] semaglutide  (WEGOVY ) 0.5 mg/0.5 mL pen injector Inject 0.5 mLs (0.5 mg total) subcutaneously once a week for 28 days 2 mL 0  . [START ON 01/19/2024] semaglutide  (WEGOVY ) 1 mg/0.5 mL pen injector Inject 0.5 mLs (1 mg total) subcutaneously once a week for 28 days 2 mL 0  . [START ON 02/16/2024] semaglutide  (WEGOVY ) 1.7 mg/0.75 mL pen injector Inject 0.75 mLs (1.7 mg total) subcutaneously once a week 3 mL 3  . spironolactone  (ALDACTONE ) 25 MG tablet Take 25 mg by mouth once daily    . sucralfate  (CARAFATE ) 1 gram tablet Take 1 g by mouth 4 (four) times daily before meals and nightly    . ubrogepant  (UBRELVY ) 100 mg Tab Take by mouth     No current facility-administered medications for this visit.    Allergies: Allergies  Allergen Reactions  . Gabapentin Other (See Comments)    Does not remember anything  . Meloxicam  Other (See Comments)    Burns up from the inside  . Amoxicillin Unknown    Other reaction(s): HIVES  . Azithromycin Unknown  . Diphenhydramine  Hcl Rash  . Other Other (See Comments)    PT states she is allergic to any of the Cillins but also states that every time she is prescribed a new antibiotic she adds it to the list.  PT has throat swelling and trouble breathing currently with all Cillins.    . Oxycodone  Unknown  . Penicillin Unknown  . Doxycycline Rash    Physical Exam: Vitals:   11/24/23 1433  BP: 122/74  Pulse: 100  SpO2: 99%  Weight: (!) 115.7 kg (255 lb)  Height: 162.6 cm (5' 4)   Body mass index is 43.77 kg/m. GENERAL: Pleasant, well-appearing female in no distress. NECK: Supple. Trachea midline. Thyroid   large right-sided palpable nodule. No bruits.   Physical exam otherwise deferred due to coronavirus precautions.  Labs: 02/17/2011: TSH = 0.03, free T4 = 1.19, free T3 = 5.53 02/05/2012: TSH <0.01 08/22/2017: K/Cr/Ca = 3.9/0.53/8.8.  CBC normal. 08/23/2017: TSH <0.01, free T4 = 2.42 (0.7-1.41), free T3 = 9.61 (2.32-6.09).  Thyrotropin receptor antibody <1. 01/11/2018: TSH<0.01, FT4=1.31, FT3=7.9.   02/02/2018: Thyroid  ultrasound shows multiple nodules including a dominant, complex right-sided nodule measuring 6.3 x 2.9 x 3.6 cm and a left upper 2.7 x 1.8 x 1.8 cm nodule and a left mid 1.4 x 1.1 x 1.2 cm nodule. 08/08/18:  TSH<0.01, FT4=1.74, FT3=7.2. 08/18/18:  NM uptake scan with 4 hour uptake=20.8%, 24 hour uptake=25.4%.  Imaging with multiple nodules.  No cold nodules.  Looks like left upper and probably lower hot areas.  Right upper hot.  Cold right lower. 09/05/2018: Biopsy of lower part of dominant right nodule showed only cyst fluid. 03/06/2019:  TSH<0.01.  FT4=1.62.  FT3=8.4.  10/16/2019: TSH <0.01.  Free T4 = 1.09.  Free T3 =5.2.   10/14/2020:  K/Cr/Ca= 4.3/0.68/9.7.  LFTs nl.  TSH<0.05.  FT4=1.74 (0.82-1.77).  FT3=5.6 (2-4.4).  04/22/2023: TSH<0.005.  FT4=1.8.  FT3=7.1.   D=11.4 06/03/2023:  K/Cr/Ca=4.2/0.66/9.1.   LFTs nl.  TSH<0.005.  FT4=1.47.  FT3=5.4.  09/02/2023:  TSH=0.022.  FT4=1.12.  Assessment/Plan: 1.  Hyperthyroidism.  She has had hyperthyroidism dating back at least to 2012.  It worsened as her T4 and T3 became overtly elevated based on labs in 5/19 and again in 10/19.  Graves' is unlikely as her thyrotropin receptor antibodies were negative in the  past.  Her nodules suggested TMNG, so I did a scan that confirmed several hot nodules.  I previously recommended radioiodine treatment but she did not want it, so I started her on MMZ.  Her TSH in 2/25 was suppressed, and her FT3 and FT4 were elevated on 10 mg MMZ so I increased her to 15mg  daily.  I wanted to do labs today but she deferred as her pcp is supposed to do labs next week.  I told her that was fine and to make sure they do her TFTs.  I will review those labs and make adjustments as needed.   2.  Multinodular goiter.  She has multiple nodules including a dominant 6.3 cm right-sided nodule as well as a left upper 2.7 and left mid 1.4 cm nodule.  Scan suggested lower half of right sided nodule was cold so FNA was attempted and resulted with cystic fluid in the lower part (which is why it was cold).   3.  Heart disease. She has CAD s/p CABG and chronic HFrEF.  She ihad an AICD placed but then had to have it removed due to infection.   She is being followed by cardiology and was last seen in 8/25.  Normalizing her thyroid  is important for her cardiovascular disease.   4.  Recurrent no-shows/cancellations.  She has no-showed or same-day cancelled multiple times in the past years.  She seems to be more concerned about her thyroid  care now since it may impact her heart health.  Hopefully she will keep future appointments.   5.  Obesity.  She was previously under the impression that her thyroid  caused it.  Most of her family is obese so I told her it was genetics as well as lifestyle that was causing it.  Her weight today is 255 pounds with a BMI of 43.77.  Her weight increased 3 pounds  since last visit.  We discussed options.  Her cardiologist suggested Wegovy  and I told her I thought that was a good option as it would help her weight but also give her CV benefit as well.  I will start her on Wegovy .  I told her to follow the standard schedule for increasing the dosage every four weeks as tolerated  through 1.7 mg weekly until next visit.  I encouraged her to keep working on diet, exercise and weight loss.    6.  She will return to clinic in 4 months.   12/06/23:  K/Cr/Ca= 4.4/0.72/9.2.  TSH=0.733.  D=18.1. 01/06/24:  UDY=9.085.  FT4=0.8 (0.82-1.77).  FT3=3.1.  This note is partially prepared by Earla Daria Messier, Scribe, in the presence of and acting as the scribe of Dr. Debby Breaker , MD.    [facility], MD

## 2023-11-24 NOTE — Progress Notes (Signed)
 Karalee Wilkie POUR, MD sent to [facility] Discussed with the gyn-onc team - they did not mean to place an order for drain placement, they simply want aspiration for symptoms and for us  to send the fluid for cytology.  Suspicion for malignancy is low, pt is not a surgical candidate and pt is aware of the very small risk of peritoneal seeding from aspiration.  Approved for CT guided aspiration.  Please send for cytology.  + sedation  HKM

## 2023-11-24 NOTE — Telephone Encounter (Signed)
 CT guided cyst drainage has been scheduled for 12/07/23 with arrival time of 1000 for 1100 appointment at the Heart and Vascular entrance at [facility]. Instructed she will need a driver. She reported she has not been on her Plavix  for a few months due to her open surgical wound. Per IR Plavix  and aspirin  need to be held for 5 days prior to procedure. Request was sent to cardiology.

## 2023-11-24 NOTE — Telephone Encounter (Signed)
 Ellouise Class, NP , patient's chart was reviewed for preoperative cardiac evaluation. She was seen by you on 11/22/23 and according to protocol, we request that you comment on cardiac risk for upcoming procedure since office visit was less than 2 months ago.    Please route your response to p cv div preop.  Thank you, Rosaline EMERSON Bane, NP-C 11/24/2023, 1:59 PM

## 2023-11-25 ENCOUNTER — Telehealth: Payer: Self-pay

## 2023-11-25 ENCOUNTER — Other Ambulatory Visit (HOSPITAL_COMMUNITY): Payer: Self-pay

## 2023-11-25 ENCOUNTER — Other Ambulatory Visit: Payer: Self-pay | Admitting: Family

## 2023-11-25 ENCOUNTER — Encounter: Payer: MEDICAID | Admitting: Physician Assistant

## 2023-11-25 DIAGNOSIS — T8131XA Disruption of external operation (surgical) wound, not elsewhere classified, initial encounter: Secondary | ICD-10-CM | POA: Diagnosis not present

## 2023-11-25 MED ORDER — SEMAGLUTIDE-WEIGHT MANAGEMENT 0.25 MG/0.5ML ~~LOC~~ SOAJ: 0.2500 mg | SUBCUTANEOUS | 0 refills | Status: AC

## 2023-11-25 NOTE — Telephone Encounter (Signed)
 Advanced Heart Failure Patient Advocate Encounter  Prior authorization for Wegovy  has been submitted and approved. Test billing returns $4 for 28 day supply. Plan limits 1 month supply of this medication.  Key: [id number] Effective: 11/24/2023 to 05/26/2024  Rachel DEL, CPhT Rx Patient Advocate Phone: [phone]

## 2023-11-25 NOTE — Telephone Encounter (Signed)
 Lvm to return call for wegovy  prescription and appt.

## 2023-11-25 NOTE — Telephone Encounter (Signed)
   Patient Name: Elizabeth Spencer  DOB: [birth year] MRN: [id number]  Primary Cardiologist: Lonni Hanson, MD  Chart reviewed as part of pre-operative protocol coverage. Given past medical history and time since last visit, based on ACC/AHA guidelines, Elizabeth Spencer is at acceptable risk for the planned procedure without further cardiovascular testing. Seen in [facility] 11/22/23 with NYHA class II/III. RCRI risk score is 5% so would be moderate risk due to her HF and CABG 05/24.   Per office protocol, if patient is without any new symptoms or concerns at the time of their virtual visit, he/she may hold Plavix   for 5 days prior to procedure. Please resume Plavix  as soon as possible postprocedure, at the discretion of the surgeon.    The patient was advised that if she develops new symptoms prior to surgery to contact our office to arrange for a follow-up visit, and she verbalized understanding.  I will route this recommendation to the requesting party via Epic fax function and remove from pre-op pool.  Please call with questions.  Lamarr Satterfield, NP 11/25/2023, 10:23 AM

## 2023-11-25 NOTE — Telephone Encounter (Signed)
 Attempted to call regarding asa hold for upcoming cyst drainage. No answer and full voicemail. Last date for asa needs to be 8/28.

## 2023-11-25 NOTE — Telephone Encounter (Signed)
 Received return call from Ms. Walls. She was notified to take asa on 8/28, then hold. She denies taking Plavix  currently due to open chest wound.

## 2023-11-28 ENCOUNTER — Telehealth: Payer: Self-pay

## 2023-11-28 ENCOUNTER — Other Ambulatory Visit: Payer: Self-pay | Admitting: Internal Medicine

## 2023-11-28 ENCOUNTER — Telehealth: Payer: Self-pay | Admitting: Family

## 2023-11-28 DIAGNOSIS — K047 Periapical abscess without sinus: Secondary | ICD-10-CM

## 2023-11-28 MED ORDER — METRONIDAZOLE 500 MG PO TABS: 500.0000 mg | ORAL_TABLET | Freq: Two times a day (BID) | ORAL | 0 refills | Status: DC

## 2023-11-28 NOTE — Telephone Encounter (Signed)
 Received Incoming fax from [employer] for [facility] . Patient has not been seen in our clinic since 2017. Prescriber is [employer]. Key for PA is [id number]

## 2023-11-28 NOTE — Telephone Encounter (Signed)
 Patient left VM stating one side of her face is swollen, she thinks it is from her bad tooth. She said Alan normally will send her in some antibiotics for this because she can't get cleared to see a dentist due to her heart problems. Please advise.

## 2023-12-06 ENCOUNTER — Ambulatory Visit: Payer: MEDICAID | Admitting: Family

## 2023-12-06 ENCOUNTER — Other Ambulatory Visit: Payer: Self-pay | Admitting: Radiology

## 2023-12-06 ENCOUNTER — Encounter: Payer: Self-pay | Admitting: Family

## 2023-12-06 ENCOUNTER — Ambulatory Visit: Payer: MEDICAID | Admitting: Physician Assistant

## 2023-12-06 VITALS — BP 132/92 | HR 101 | Ht 67.0 in | Wt 257.4 lb

## 2023-12-06 DIAGNOSIS — E538 Deficiency of other specified B group vitamins: Secondary | ICD-10-CM | POA: Diagnosis not present

## 2023-12-06 DIAGNOSIS — E782 Mixed hyperlipidemia: Secondary | ICD-10-CM | POA: Diagnosis not present

## 2023-12-06 DIAGNOSIS — R7303 Prediabetes: Secondary | ICD-10-CM

## 2023-12-06 DIAGNOSIS — Z0130 Encounter for examination of blood pressure without abnormal findings: Secondary | ICD-10-CM

## 2023-12-06 DIAGNOSIS — E785 Hyperlipidemia, unspecified: Secondary | ICD-10-CM

## 2023-12-06 DIAGNOSIS — R0789 Other chest pain: Secondary | ICD-10-CM

## 2023-12-06 DIAGNOSIS — E0590 Thyrotoxicosis, unspecified without thyrotoxic crisis or storm: Secondary | ICD-10-CM

## 2023-12-06 DIAGNOSIS — N83209 Unspecified ovarian cyst, unspecified side: Secondary | ICD-10-CM

## 2023-12-06 DIAGNOSIS — E559 Vitamin D deficiency, unspecified: Secondary | ICD-10-CM | POA: Diagnosis not present
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
## 2023-12-06 NOTE — H&P (Shared)
 Chief Complaint: Patient was seen in consultation today for symptomatic right ovarian cyst, with consideration for drainage.  Referring Provider(s): Ms. Tinnie Dawn, NP   Supervising Physician: Philip Cornet  Patient Status: [facility] - Out-pt  Patient is Full Code  History of Present Illness: Elizabeth Spencer is a 38 y.o. female  with PMHx notable for cyst or right ovary, bipolar affective disorder, CAD s/p CABG, ischemic cardiomyopathy with ICD implant and explant, systolic CHF with EF 30-35%, cocaine use, and factor V Leiden with DVT of LUE, and others as delineated below.  Per Ms. Allen's progress note on 8/20: [Patient] presents with enlarging pelvic mass with mural thickening now measuring 12.4 x 8.2 x 8.9 cm enlarged from ultrasound in 2020 for at which time it measured 7.4 x 5.8 x 5.3 cm.  Additionally, increased vascularity within the thickened wall.  She is status post hysterectomy for benign disease.  Symptomatic of pain. Pre-menopausal ROMA score 11.5% (high risk), however CA125 may be due to cardiac issues and not overly concerning in a 38 y.o. Her HE4 is normal which is reasurring. Cystic mass arising from right ovary measuring 8.7 x 9.5 cm, enlarging. No evidence of metastatic disease or lymphadenopathy.   [...]Given her medical issues, evaluation on imaging and reassuring HE4 recommended imaging guided drainage with removal of as fluid as possible and send for cytology.    I will plan for her to return to clinic in 2-3 months follow-up to evaluate her symptoms post drainage. The patient agrees with this approach.   Interventional Radiology was requested for right ovarian cyst drainage. Request was reviewed and approved by Dr. Karalee. Patient is scheduled for same in IR today.   Patient is alert and laying in bed, somewhat anxious. She is currently complaining of chronic lumbar back pain, and mild, left-sided abdominal pain, which worsened again yesterday. She endorses  chest wall pain, with chest tightness from anxiety related to procedure.  The chest wall pain stems from two healing incisions from recent infected defibrillator removal around left breast, with the incision overlaying the sternum being packed with gauze. She recently completed a course of antibiotics for this, but cannot specify which. She was nauseated this morning, and did take a Zofran  pill, per her report. Patient denies any fevers, headache, SOB, cough, nausea, vomiting or bleeding.     Past Medical History:  Diagnosis Date   Aneurysm (HCC) 01/04/2014   Arthritis    Asthma    WELL CONTROLLED   Brain aneurysm 2007   NEUROLOGY NOTE DOES NOT MENTION ANEURYSM BUT PT STATES SHE DID NOT HAVE TO HAVE SURGERY   Complication of anesthesia    FOR 1 C-SECTION PT WAS ITCHING AND VERY RED ON HER FACE   Cough, persistent 01/27/2016   Dermatitis due to sunburn 11/08/2013   Family history of adverse reaction to anesthesia    brother, neice and nephew got red in face with hives   Gallstones    GERD (gastroesophageal reflux disease)    Gonorrhea 06/20/2020   Headache    CHRONIC HEADACHES   History of chronic cough    DRY   Loss of memory 05/30/2017   Perforation of left tympanic membrane 05/17/2013   Pneumonia    PTSD (post-traumatic stress disorder)    PTSD (post-traumatic stress disorder)    Thyroid  condition    PT WAS JUST TOLD ON 08-25-17 THAT SHE HAS A THYROID  PROBLEM AND IS GOING TO F/U WITH ENDOCRINOLOGIST IN 2 WEEKS  Trichomonas vaginalis (TV) infection 06/20/2020    Past Surgical History:  Procedure Laterality Date   ABDOMINAL HYSTERECTOMY     CESAREAN SECTION     x3   CORONARY ARTERY BYPASS GRAFT N/A 09/02/2022   Procedure: CORONARY ARTERY BYPASS GRAFTING (CABG) X 1 USING LEFT INTERNAL MAMMARY ARTERY;  Surgeon: Lucas Dorise POUR, MD;  Location: [facility] OR;  Service: Open Heart Surgery;  Laterality: N/A;   CYSTOSCOPY N/A 09/29/2017   Procedure: CYSTOSCOPY;  Surgeon: Lake Read, MD;  Location: [facility] ORS;  Service: Gynecology;  Laterality: N/A;   CYSTOSCOPY N/A 11/05/2017   Procedure: CYSTOSCOPY;  Surgeon: Lake Read, MD;  Location: [facility] ORS;  Service: Gynecology;  Laterality: N/A;   HYSTEROSCOPY WITH D & C N/A 09/01/2017   Procedure: DILATATION AND CURETTAGE /HYSTEROSCOPY;  Surgeon: Lake Read, MD;  Location: [facility] ORS;  Service: Gynecology;  Laterality: N/A;   IMPLANT POCKET INCISION & DRAINAGE N/A 10/06/2023   Procedure: IMPLANT POCKET INCISION & DRAINAGE;  Surgeon: Kennyth Chew, MD;  Location: [facility] INVASIVE CV LAB;  Service: Cardiovascular;  Laterality: N/A;   LAPAROSCOPY N/A 09/01/2017   Procedure: LAPAROSCOPY OPERATIVE with biopsy;  Surgeon: Lake Read, MD;  Location: [facility] ORS;  Service: Gynecology;  Laterality: N/A;   LAPAROSCOPY N/A 11/05/2017   Procedure: LAPAROSCOPY DIAGNOSTIC;  Surgeon: Lake Read, MD;  Location: [facility] ORS;  Service: Gynecology;  Laterality: N/A;   LEFT HEART CATH AND CORONARY ANGIOGRAPHY N/A 09/01/2022   Procedure: LEFT HEART CATH AND CORONARY ANGIOGRAPHY;  Surgeon: Mady Bruckner, MD;  Location: [facility] INVASIVE CV LAB;  Service: Cardiovascular;  Laterality: N/A;   REPAIR VAGINAL CUFF N/A 11/05/2017   Procedure: REPAIR VAGINAL CUFF;  Surgeon: Lake Read, MD;  Location: [facility] ORS;  Service: Gynecology;  Laterality: N/A;   SUBQ ICD IMPLANT N/A 08/08/2023   Procedure: SUBQ ICD IMPLANT;  Surgeon: Kennyth Chew, MD;  Location: [facility] INVASIVE CV LAB;  Service: Cardiovascular;  Laterality: N/A;   SUBQ ICD REVISION N/A 08/09/2023   Procedure: SUBQ ICD REVISION;  Surgeon: Kennyth Chew, MD;  Location: [facility] INVASIVE CV LAB;  Service: Cardiovascular;  Laterality: N/A;   SUBQ ICD REVISION N/A 09/05/2023   Procedure: SUBQ ICD REVISION;  Surgeon: Kennyth Chew, MD;  Location: [facility] INVASIVE CV LAB;  Service: Cardiovascular;  Laterality: N/A;   TEE WITHOUT CARDIOVERSION N/A 09/02/2022   Procedure: TRANSESOPHAGEAL  ECHOCARDIOGRAM;  Surgeon: Lucas Dorise POUR, MD;  Location: [facility] OR;  Service: Open Heart Surgery;  Laterality: N/A;   TOTAL LAPAROSCOPIC HYSTERECTOMY WITH SALPINGECTOMY Bilateral 09/29/2017   Procedure: TOTAL LAPAROSCOPIC HYSTERECTOMY WITH SALPINGECTOMY;  Surgeon: Lake Read, MD;  Location: [facility] ORS;  Service: Gynecology;  Laterality: Bilateral;    Allergies: Azithromycin, Clindamycin , Codeine, Meloxicam , Neurontin [gabapentin], Penicillins, Other, Diphenhydramine  hcl, Doxycycline, and Egg-derived products  Medications: Prior to Admission medications   Medication Sig Start Date End Date Taking? Authorizing Provider  acetaminophen  (TYLENOL ) 500 MG tablet Take 1,000 mg by mouth 2 (two) times daily. Patient not taking: Reported on 12/06/2023    [provider]  ALPRAZolam  (ALPRAZOLAM  XR) 0.5 MG 24 hr tablet TAKE 1 TABLET BY MOUTH TWICE DAILY AS NEEDED FOR ANXIETY OR SLEEP 10/25/23   Orlean Alan HERO, FNP  aspirin  81 MG chewable tablet Chew 1 tablet (81 mg total) by mouth daily. Patient not taking: Reported on 12/06/2023 05/03/23   Furth, Cadence H, PA-C  atorvastatin  (LIPITOR) 40 MG tablet Take 1 tablet (40 mg total) by mouth daily. 08/01/23   Donette Ellouise LABOR, FNP  Biotin w/ Vitamins C &  E (HAIR SKIN & NAILS GUMMIES PO) Take 1 tablet by mouth daily. Biotin    [provider]  cetirizine  (ZYRTEC ) 10 MG tablet Take 1 tablet (10 mg total) by mouth daily. Patient taking differently: Take 10 mg by mouth at bedtime. 08/01/23   Orlean Alan HERO, FNP  clopidogrel  (PLAVIX ) 75 MG tablet Take 1 tablet (75 mg total) by mouth daily. Patient not taking: Reported on 12/06/2023 08/01/23   Donette Ellouise LABOR, FNP  dapagliflozin  propanediol (FARXIGA ) 10 MG TABS tablet Take 1 tablet (10 mg total) by mouth daily before breakfast. 09/30/23   Bensimhon, Toribio SAUNDERS, MD  diphenhydrAMINE  (BENADRYL ) 25 MG tablet Take 50 mg by mouth every 6 (six) hours as needed (allergic reaction). Dye and glutin free    [provider]  empagliflozin  (JARDIANCE ) 10 MG TABS tablet Take 1 tablet (10 mg total) by mouth daily before breakfast. 11/22/23   Donette Ellouise LABOR, FNP  EPIPEN  2-PAK 0.3 MG/0.3ML SOAJ injection INJECT INTO THIGH MUSCLE THROUGH CLOTHES AS NEEDED SEVERE ALLERGIC REACTION 08/01/23   Orlean Alan HERO, FNP  fluconazole  (DIFLUCAN ) 150 MG tablet Take 1 tablet (150 mg total) by mouth daily. 10/06/23   Orlean Alan HERO, FNP  fluticasone  (FLONASE ) 50 MCG/ACT nasal spray 1 spray by Each Nare route daily. 06/17/22   Orlean Alan HERO, FNP  furosemide  (LASIX ) 20 MG tablet Take 1 tablet (20 mg total) by mouth daily as needed. 06/14/23 12/06/23  Kennyth Chew, MD  linezolid  (ZYVOX ) 600 MG tablet Take 1 tablet (600 mg total) by mouth 2 (two) times daily. 09/22/23   Luiz Channel, MD  methimazole  (TAPAZOLE ) 10 MG tablet Take 1 tablet (10 mg total) by mouth daily. Patient taking differently: Take 15 mg by mouth daily. Total 15 mg 07/14/23   Orlean Alan HERO, FNP  metoprolol  succinate (TOPROL  XL) 25 MG 24 hr tablet Take 1 tablet (25 mg total) by mouth daily. 08/01/23   Donette Ellouise LABOR, FNP  metroNIDAZOLE  (FLAGYL ) 500 MG tablet Take 1 tablet (500 mg total) by mouth 2 (two) times daily. 11/28/23   Fernand Fredy RAMAN, MD  montelukast  (SINGULAIR ) 10 MG tablet Take 1 tablet (10 mg total) by mouth at bedtime. 08/01/23   Orlean Alan HERO, FNP  naloxone  (NARCAN ) nasal spray 4 mg/0.1 mL Place 1 spray into the nose Once PRN for up to 1 dose (opioid overdose). 11/08/23   Orlean Alan HERO, FNP  omeprazole  (PRILOSEC) 20 MG capsule Take 1 capsule (20 mg total) by mouth 2 (two) times daily. 08/01/23   Orlean Alan HERO, FNP  ondansetron  (ZOFRAN -ODT) 4 MG disintegrating tablet Take 1 tablet (4 mg total) by mouth every 6 (six) hours as needed for nausea or vomiting. 11/03/23   Ward, Josette SAILOR, DO  semaglutide -weight management (WEGOVY ) 0.25 MG/0.5ML SOAJ SQ injection Inject 0.25 mg into the skin once a week for 28 days. 11/25/23 12/23/23  Donette Ellouise LABOR, FNP  spironolactone  (ALDACTONE ) 25 MG tablet Take 1 tablet (25 mg total) by mouth daily. 08/01/23   Donette Ellouise LABOR, FNP  Ubrogepant  100 MG TABS Take 1 tablet by mouth See admin instructions. Take one tablet by mouth at onset of migraine. May take an additional tablet two hours later if migraine persists.  No more than 2 tablets in 24 hours.    [provider]     Family History  Problem Relation Age of Onset   Hypertension Mother    Alcohol abuse Mother    Deep vein thrombosis Mother  Alcohol abuse Father    COPD Father    Emphysema Father    Heart disease Father     Social History   Socioeconomic History   Marital status: Single    Spouse name: Not on file   Number of children: Not on file   Years of education: Not on file   Highest education level: Not on file  Occupational History   Not on file  Tobacco Use   Smoking status: Every Day    Current packs/day: 0.50    Average packs/day: 0.5 packs/day for 18.0 years (9.0 ttl pk-yrs)    Types: Cigarettes    Passive exposure: Past   Smokeless tobacco: Never  Vaping Use   Vaping status: Never Used  Substance and Sexual Activity   Alcohol use: Yes    Alcohol/week: 0.0 standard drinks of alcohol    Comment: occasionally   Drug use: Not Currently    Types: Marijuana, Cocaine   Sexual activity: Yes    Partners: Male    Birth control/protection: Surgical    Comment: Hysterectomy  Other Topics Concern   Not on file  Social History Narrative   Not on file   Social Drivers of Health   Financial Resource Strain: Not on file  Food Insecurity: No Food Insecurity (11/09/2023)   Hunger Vital Sign    Worried About Running Out of Food in the Last Year: Never true    Ran Out of Food in the Last Year: Never true  Transportation Needs: No Transportation Needs (11/09/2023)   PRAPARE - Administrator, Civil Service (Medical): No    Lack of Transportation (Non-Medical): No  Physical Activity: Not on file   Stress: Not on file  Social Connections: Unknown (08/08/2023)   Social Connection and Isolation Panel    Frequency of Communication with Friends and Family: More than three times a week    Frequency of Social Gatherings with Friends and Family: More than three times a week    Attends Religious Services: Not on Marketing executive or Organizations: Not on file    Attends Banker Meetings: Not on file    Marital Status: Living with partner     Review of Systems: A 12 point ROS discussed and pertinent positives are indicated in the HPI above.  All other systems are negative.  Vital Signs: LMP 08/22/2017 Comment: says she just spots.  Advance Care Plan: The advanced care place/surrogate decision maker was discussed at the time of visit and the patient did not wish to discuss or was not able to name a surrogate decision maker or provide an advance care plan.  Physical Exam Constitutional:      General: She is not in acute distress.    Appearance: Normal appearance. She is obese.     Comments: Patient is anxious.  HENT:     Mouth/Throat:     Mouth: Mucous membranes are dry.  Cardiovascular:     Rate and Rhythm: Normal rate and regular rhythm.     Pulses: Normal pulses.     Heart sounds: Normal heart sounds.  Pulmonary:     Effort: Pulmonary effort is normal.     Breath sounds: Normal breath sounds.  Abdominal:     General: Abdomen is flat.     Palpations: Abdomen is soft.     Tenderness: There is abdominal tenderness.     Comments: Moderate tenderness to palpation midline at suprapubic level as well  as LLQ and left flank.  Musculoskeletal:        General: Normal range of motion.     Cervical back: Normal range of motion.  Skin:    General: Skin is warm and dry.     Comments: Packed incision overlying sternum, with appropriate dressing in place. Healing incision overlying upper left breast, appropriately dressed. Both with surrounding tenderness to  palpation.   Neurological:     Mental Status: She is alert and oriented to person, place, and time.  Psychiatric:        Mood and Affect: Mood normal.        Behavior: Behavior normal.        Thought Content: Thought content normal.        Judgment: Judgment normal.     Imaging: CT CHEST ABDOMEN PELVIS W CONTRAST Result Date: 11/22/2023 CLINICAL DATA:  Cystic left ovarian lesion.  * Tracking Code: BO * EXAM: CT CHEST, ABDOMEN, AND PELVIS WITH CONTRAST TECHNIQUE: Multidetector CT imaging of the chest, abdomen and pelvis was performed following the standard protocol during bolus administration of intravenous contrast. RADIATION DOSE REDUCTION: This exam was performed according to the departmental dose-optimization program which includes automated exposure control, adjustment of the mA and/or kV according to patient size and/or use of iterative reconstruction technique. CONTRAST:  OMNIPAQUE  IOHEXOL  300 MG/ML  SOLN COMPARISON:  11/02/2023 abdominopelvic CT.  10/07/2023 chest CT. FINDINGS: CT CHEST FINDINGS Cardiovascular: Aortic atherosclerosis. Mild cardiomegaly, without pericardial effusion. Median sternotomy for CABG. No central pulmonary embolism, on this non-dedicated study. Mediastinum/Nodes: Thyroid  nodules of maximally 1.6 cm. No supraclavicular adenopathy. No mediastinal or hilar adenopathy. Lungs/Pleura: No pleural fluid. Motion degradation in the lower chest. Subsegmental atelectasis in both lung bases. 5 mm right lower lobe pulmonary nodule on 104/4 is similar to on the prior and present back to at least 08/31/2022, considered benign. Musculoskeletal: Presternal skin and subcutaneous defect superiorly on 16/2. Presternal subcutaneous edema and minimal gas inferiorly including on 44/2. These are both the site of larger gas pockets on the prior. Intact sternotomy wires. CT ABDOMEN PELVIS FINDINGS Hepatobiliary: Mild caudate lobe enlargement. Motion degradation continuing into the upper  abdomen. Gallbladder likely surgically absent. Pancreas: Normal, without mass or ductal dilatation. Spleen: Normal in size, without focal abnormality. Adrenals/Urinary Tract: Normal adrenal glands. Normal kidneys, without hydronephrosis. Normal urinary bladder. Stomach/Bowel: Normal stomach, without wall thickening. Colonic stool burden suggests constipation. Normal terminal ileum. Normal small bowel. Vascular/Lymphatic: Aortic atherosclerosis. No abdominopelvic adenopathy. Reproductive: Hysterectomy.  The left ovary is  normal on 88/2. A cystic mass within the central pelvis, favored to arise from the right ovary, measures 8.7 by 9.5 cm on 101/2. Compare 9.9 x 9.4 cm when measured in a similar fashion on the prior. 9.4 cm craniocaudal today on sagittal image 81 versus 9.0 cm on the prior when remeasured in a similar fashion. Other: Mild pelvic floor laxity. No significant free fluid. No free intraperitoneal air. Musculoskeletal: No acute osseous abnormality. IMPRESSION: 1. Mild motion degradation involving the lower chest and abdomen. 2. Similar size of a pelvic cystic mass, favored to arise from the right ovary and represent benign or malignant cystic neoplasm. No evidence of metastatic disease. 3. Right lower lobe pulmonary nodules unchanged back to at least 08/31/2022, considered benign. 4. Areas of soft tissue thickening and gas in the presternal space, at the site of gas pockets on the prior exam. Likely sequelae of interval debridement. No well-defined drainable collection identified. 5. Thyroid  nodules up to 1.6  cm. Recommend thyroid  US  (ref: J Am Coll Radiol. 2015 Feb;12(2): 143-50). 6. Incidental findings, including: Aortic Atherosclerosis (ICD10-I70.0). Possible constipation. Electronically Signed   By: Rockey Kilts M.D.   On: 11/22/2023 16:02    Labs:  CBC: Recent Labs    09/05/23 0448 09/19/23 1010 10/07/23 0216 11/02/23 2315  WBC 7.9 10.1 10.0 10.4  HGB 13.6 15.2 15.5* 15.7*  HCT 42.2  48.1* 46.8* 46.9*  PLT 237 294 281 288    COAGS: Recent Labs    05/25/23 1620  INR 1.1    BMP: Recent Labs    09/04/23 0336 09/05/23 0448 09/19/23 1010 10/07/23 0307 11/02/23 2315  NA 137 138 139 137 137  K 3.7 3.5 4.2 3.8 3.3*  CL 110 108 108 111 107  CO2 17* 18* 19* 17* 21*  GLUCOSE 89 94 84 110* 109*  BUN 6 6 14 9 8   CALCIUM  8.6* 8.8* 9.2 8.9 9.2  CREATININE 0.60 0.59 0.75 0.45 0.66  GFRNONAA >60 >60  --  >60 >60    LIVER FUNCTION TESTS: Recent Labs    09/04/23 0336 09/05/23 0448 10/07/23 0307 11/02/23 2315  BILITOT 0.9 0.7 0.7 0.5  AST 12* 11* 21 18  ALT 15 13 21 16   ALKPHOS 59 62 78 77  PROT 5.8* 6.2* 7.2 6.9  ALBUMIN  2.7* 2.9* 4.2 3.9    TUMOR MARKERS: No results for input(s): AFPTM, CEA, CA199, CHROMGRNA in the last 8760 hours.  Assessment and Plan: Per Ms. Allen's progress note on 8/20: [Patient] presents with enlarging pelvic mass with mural thickening now measuring 12.4 x 8.2 x 8.9 cm enlarged from ultrasound in 2020 for at which time it measured 7.4 x 5.8 x 5.3 cm.  Additionally, increased vascularity within the thickened wall.  She is status post hysterectomy for benign disease.  Symptomatic of pain. Pre-menopausal ROMA score 11.5% (high risk), however CA125 may be due to cardiac issues and not overly concerning in a 38 y.o. Her HE4 is normal which is reasurring. Cystic mass arising from right ovary measuring 8.7 x 9.5 cm, enlarging. No evidence of metastatic disease or lymphadenopathy.   [...]Given her medical issues, evaluation on imaging and reassuring HE4 recommended imaging guided drainage with removal of as fluid as possible and send for cytology.  Patient presents for scheduled right ovarian cyst drainage in IR today.  Patient has been NPO since midnight.  All labs and medications are within acceptable parameters. Last dose Aspirin  was 8/28.Current;y not taking her Plavix  due to recent defibrillator removal. Patient has a notable  allergy to codeine with hives, as well as several antibiotics, including anaphylaxis to Penicillins. Patient also states that Fentanyl  makes her crazy, and is agreeable to proceeding with one drug only (Versed ).  Risks and benefits discussed with the patient including bleeding, infection, damage to adjacent structures, bowel perforation/fistula connection, and sepsis.  All of the patient's questions were answered, patient is agreeable to proceed. Consent signed and in chart.    Thank you for allowing our service to participate in Elizabeth Spencer 's care.  Electronically Signed: Carlin DELENA Griffon, PA-C   12/06/2023, 4:46 PM      I spent a total of 30 Minutes in face to face in clinical consultation, greater than 50% of which was counseling/coordinating care for symptomatic right ovarian cyst, with consideration for drainage.

## 2023-12-06 NOTE — Assessment & Plan Note (Signed)
 Checking labs today.  Continue current therapy for lipid control. Will modify as needed based on labwork results.   -CMP w/eGFR -Lipid Panel

## 2023-12-06 NOTE — Progress Notes (Signed)
 Patient for CT guided ovarian cyst aspiration on Wed 12/07/23, I called and spoke with the patient on the phone and gave pre-procedure instructions. Pt was made aware to be here at 10a, last dose of ASA 81mg  was Thurs 8/28, not taking Plavix  right now, and NPO after MN prior to procedure as well as driver post procedure/recovery/discharge. Pt stated understanding. Called 12/06/23

## 2023-12-06 NOTE — Assessment & Plan Note (Signed)
 Checking labs today.  Will continue supplements as needed.   - Vitamin D  - Vitamin B12 - TSH

## 2023-12-07 ENCOUNTER — Other Ambulatory Visit: Payer: Self-pay

## 2023-12-07 ENCOUNTER — Ambulatory Visit
Admission: RE | Admit: 2023-12-07 | Discharge: 2023-12-07 | Disposition: A | Discharge status: Home / Self Care | Payer: MEDICAID | Source: Ambulatory Visit | Attending: Nurse Practitioner | Admitting: Nurse Practitioner

## 2023-12-07 ENCOUNTER — Telehealth: Payer: Self-pay

## 2023-12-07 DIAGNOSIS — N83209 Unspecified ovarian cyst, unspecified side: Secondary | ICD-10-CM | POA: Diagnosis present

## 2023-12-07 DIAGNOSIS — Z7189 Other specified counseling: Secondary | ICD-10-CM | POA: Insufficient documentation

## 2023-12-07 LAB — CBC
HCT: 45.3 % (ref 36.0–46.0)
Hemoglobin: 14.9 g/dL (ref 12.0–15.0)
MCH: 29.8 pg (ref 26.0–34.0)
MCHC: 32.9 g/dL (ref 30.0–36.0)
MCV: 90.6 fL (ref 80.0–100.0)
Platelets: 224 K/uL (ref 150–400)
RBC: 5 MIL/uL (ref 3.87–5.11)
RDW: 14.1 % (ref 11.5–15.5)
WBC: 7.7 K/uL (ref 4.0–10.5)
nRBC: 0 % (ref 0.0–0.2)

## 2023-12-07 LAB — PROTIME-INR
INR: 1 (ref 0.8–1.2)
Prothrombin Time: 13.6 s (ref 11.4–15.2)

## 2023-12-07 MED ORDER — MIDAZOLAM HCL 2 MG/2ML IJ SOLN
INTRAMUSCULAR | Status: AC
  Filled 2023-12-07: qty 6

## 2023-12-07 MED ORDER — SODIUM CHLORIDE 0.9 % IV SOLN: INTRAVENOUS | Status: DC

## 2023-12-07 MED ORDER — MIDAZOLAM HCL 2 MG/2ML IJ SOLN
INTRAMUSCULAR | Status: AC | PRN
  Administered 2023-12-07 (×2): 1 mg via INTRAVENOUS
  Administered 2023-12-07: 2 mg via INTRAVENOUS

## 2023-12-07 NOTE — Telephone Encounter (Signed)
 Pt is requesting tramadol  be sent in that was discussed at her appt.

## 2023-12-07 NOTE — Discharge Instructions (Signed)
Needle Aspiration, Care After The following information offers guidance on how to care for yourself after your procedure. Your health care provider may also give you more specific instructions. If you have problems or questions, contact your health care provider. What can I expect after the procedure? After the procedure, it is common to have: Soreness, pain, and tenderness where your aspiration was performed.  Bruising or mild pain at the aspiration site.  These symptoms should go away after a few days. Follow these instructions at home: Biopsy site care  Follow instructions from your health care provider about how to take care of your aspiration site. Make sure you: Wash your hands with soap and water for at least 20 seconds before and after you change your bandage (dressing). If soap and water are not available, use hand sanitizer. Remove dressing tomorrow Check your puncture site every day for signs of infection. Check for: More redness, swelling, or pain. More drainage of fluid or blood. More warmth. Pus or a bad smell. General instructions Rest as told by your health care provider. Do not take baths, swim, or use a hot tub for 1 week.  You may shower tomorrow. Take over-the-counter and prescription medicines only as told by your health care provider. Return to your normal activities tomorrow.  If you have airplane travel scheduled, talk with your health care provider about when it is safe for you to travel by airplane. It is up to you to get the results of your procedure. Ask your health care provider, or the department that is doing the procedure, when your results will be ready. Keep all follow-up visits.   Contact a health care provider if: You have a fever. You have more redness, swelling, or pain at the puncture site that lasts longer than a few days. You have more fluid or blood coming from your puncture site. You have pus or a bad smell coming from your puncture site. Your  puncture site feels warm to the touch. You have pain that does not get better with medicine. Get help right away if: You have severe bleeding from the puncture site. You have chest pain. You have problems breathing. You cough up blood. You faint. You have a very fast heart rate. These symptoms may be an emergency. Get help right away. Call 911. Do not wait to see if the symptoms will go away. Do not drive yourself to the hospital. Summary After the procedure, it is common to have soreness, bruising, tenderness, or mild pain at the aspiration site.  These symptoms should go away in a few days. Check your aspiration site every day for signs of infection, such as more redness, swelling, or pain. Do not take baths, swim, or use a hot tub for one week. Ask your health care provider if you may take showers. Contact a heath care provider if you have more redness, swelling, or pain at the puncture site that lasts longer than a few days. This information is not intended to replace advice given to you by your health care provider. Make sure you discuss any questions you have with your health care provider. Document Revised: 03/18/2021 Document Reviewed: 03/18/2021 Elsevier Patient Education  2023 [employer]  

## 2023-12-07 NOTE — Procedures (Signed)
 Interventional Radiology Procedure:   Indications: Symptomatic right ovarian cyst   Procedure: CT guided aspiration of right ovarian cyst  Findings: Yueh catheter placed in right ovarian cyst and aspirated 400 ml of brown fluid.  Cyst was decompressed with minimal residual fluid after aspiration.   Complications: None     EBL: Minimal  Plan: Fluid sent for cytology.  Amarian Botero R. Philip, MD  Pager: [phone]

## 2023-12-09 ENCOUNTER — Other Ambulatory Visit: Payer: Self-pay

## 2023-12-09 LAB — CYTOLOGY - NON PAP

## 2023-12-09 MED ORDER — TRAMADOL HCL 50 MG PO TABS: 50.0000 mg | ORAL_TABLET | Freq: Three times a day (TID) | ORAL | 0 refills | Status: AC | PRN

## 2023-12-09 NOTE — Telephone Encounter (Signed)
 Pended the medication to the pcp for refill

## 2023-12-10 LAB — CBC WITH DIFFERENTIAL/PLATELET
Basophils Absolute: 0.1 x10E3/uL (ref 0.0–0.2)
Basos: 1 %
EOS (ABSOLUTE): 0.2 x10E3/uL (ref 0.0–0.4)
Eos: 2 %
Hematocrit: 47.4 % — ABNORMAL HIGH (ref 34.0–46.6)
Hemoglobin: 15.6 g/dL (ref 11.1–15.9)
Immature Grans (Abs): 0 x10E3/uL (ref 0.0–0.1)
Immature Granulocytes: 0 %
Lymphocytes Absolute: 3.4 x10E3/uL — ABNORMAL HIGH (ref 0.7–3.1)
Lymphs: 37 %
MCH: 30.3 pg (ref 26.6–33.0)
MCHC: 32.9 g/dL (ref 31.5–35.7)
MCV: 92 fL (ref 79–97)
Monocytes Absolute: 0.9 x10E3/uL (ref 0.1–0.9)
Monocytes: 9 %
Neutrophils Absolute: 4.5 x10E3/uL (ref 1.4–7.0)
Neutrophils: 51 %
Platelets: 190 x10E3/uL (ref 150–450)
RBC: 5.15 x10E6/uL (ref 3.77–5.28)
RDW: 13.2 % (ref 11.7–15.4)
WBC: 9.1 x10E3/uL (ref 3.4–10.8)

## 2023-12-10 LAB — CMP14+EGFR
ALT: 16 IU/L (ref 0–32)
AST: 16 IU/L (ref 0–40)
Albumin: 4.2 g/dL (ref 3.9–4.9)
Alkaline Phosphatase: 88 IU/L (ref 44–121)
BUN/Creatinine Ratio: 14 (ref 9–23)
BUN: 10 mg/dL (ref 6–24)
Bilirubin Total: 0.2 mg/dL (ref 0.0–1.2)
CO2: 19 mmol/L — ABNORMAL LOW (ref 20–29)
Calcium: 9.2 mg/dL (ref 8.7–10.2)
Chloride: 107 mmol/L — ABNORMAL HIGH (ref 96–106)
Creatinine, Ser: 0.72 mg/dL (ref 0.57–1.00)
Globulin, Total: 2.6 g/dL (ref 1.5–4.5)
Glucose: 81 mg/dL (ref 70–99)
Potassium: 4.4 mmol/L (ref 3.5–5.2)
Sodium: 142 mmol/L (ref 134–144)
Total Protein: 6.8 g/dL (ref 6.0–8.5)
eGFR: 108 mL/min/1.73 (ref >59)

## 2023-12-10 LAB — LIPID PANEL
Chol/HDL Ratio: 3 ratio (ref 0.0–4.4)
Cholesterol, Total: 146 mg/dL (ref 100–199)
HDL: 48 mg/dL (ref >39)
LDL Chol Calc (NIH): 67 mg/dL (ref 0–99)
Triglycerides: 183 mg/dL — ABNORMAL HIGH (ref 0–149)
VLDL Cholesterol Cal: 31 mg/dL (ref 5–40)

## 2023-12-10 LAB — VITAMIN D 25 HYDROXY (VIT D DEFICIENCY, FRACTURES): Vit D, 25-Hydroxy: 18.1 ng/mL — ABNORMAL LOW (ref 30.0–100.0)

## 2023-12-10 LAB — VITAMIN B12

## 2023-12-10 LAB — HEMOGLOBIN A1C
Est. average glucose Bld gHb Est-mCnc: 105 mg/dL
Hgb A1c MFr Bld: 5.3 % (ref 4.8–5.6)

## 2023-12-10 LAB — TSH: TSH: 0.733 u[IU]/mL (ref 0.450–4.500)

## 2023-12-12 ENCOUNTER — Ambulatory Visit: Payer: Self-pay

## 2023-12-13 ENCOUNTER — Telehealth: Payer: Self-pay | Admitting: Nurse Practitioner

## 2023-12-13 ENCOUNTER — Ambulatory Visit: Payer: MEDICAID | Admitting: Cardiology

## 2023-12-13 DIAGNOSIS — N83209 Unspecified ovarian cyst, unspecified side: Secondary | ICD-10-CM

## 2023-12-13 NOTE — Telephone Encounter (Signed)
 Spoke to patient by phone. Reviewed pathology which was benign:   INTERPRETATION(S):      NEGATIVE FOR MALIGNANCY       SCATTERED MACROPHAGES CONSISTENT WITH BENIGN CYST CONTENTS   Her pain had improved initially then worsened 24 hours after procedure. It now waxes and wanes. She is taking low dose tramadol . She equates pain to being post procedural and increased exertion as she is moving. Pain also worsens when she is constipated. We discussed constipation management. Plan to follow up in 3 months with ultrasound prior. Ordered. Schedule message sent.

## 2023-12-16 ENCOUNTER — Inpatient Hospital Stay: Admission: RE | Admit: 2023-12-16 | Payer: MEDICAID | Source: Ambulatory Visit

## 2023-12-16 ENCOUNTER — Other Ambulatory Visit: Payer: Self-pay | Admitting: Family Medicine

## 2023-12-16 DIAGNOSIS — R110 Nausea: Secondary | ICD-10-CM

## 2023-12-16 DIAGNOSIS — R1011 Right upper quadrant pain: Secondary | ICD-10-CM

## 2023-12-19 ENCOUNTER — Encounter: Payer: MEDICAID | Attending: Physician Assistant | Admitting: Physician Assistant

## 2023-12-19 ENCOUNTER — Encounter: Payer: MEDICAID | Admitting: Family

## 2023-12-19 DIAGNOSIS — I2510 Atherosclerotic heart disease of native coronary artery without angina pectoris: Secondary | ICD-10-CM | POA: Diagnosis not present

## 2023-12-19 DIAGNOSIS — G4733 Obstructive sleep apnea (adult) (pediatric): Secondary | ICD-10-CM | POA: Diagnosis not present

## 2023-12-19 DIAGNOSIS — X58XXXA Exposure to other specified factors, initial encounter: Secondary | ICD-10-CM | POA: Diagnosis not present

## 2023-12-19 DIAGNOSIS — F3189 Other bipolar disorder: Secondary | ICD-10-CM | POA: Insufficient documentation

## 2023-12-19 DIAGNOSIS — T8131XA Disruption of external operation (surgical) wound, not elsewhere classified, initial encounter: Secondary | ICD-10-CM | POA: Insufficient documentation

## 2023-12-19 DIAGNOSIS — I5022 Chronic systolic (congestive) heart failure: Secondary | ICD-10-CM | POA: Insufficient documentation

## 2023-12-19 DIAGNOSIS — Z86718 Personal history of other venous thrombosis and embolism: Secondary | ICD-10-CM | POA: Insufficient documentation

## 2023-12-26 ENCOUNTER — Telehealth: Payer: Self-pay | Admitting: Family

## 2023-12-26 NOTE — Telephone Encounter (Signed)

## 2023-12-26 NOTE — Progress Notes (Unsigned)
 [facility] CONSULT NOTE  Referring Physician: Orlean Alan HERO, FNP Primary Care: Orlean Alan HERO, FNP Primary Cardiologist: Lonni Hanson, MD  Chief Complaint: shortness of breath   HPI:  Elizabeth Spencer is a 38 y.o. female with a history of ICM, HFrEF s/p SQ-ICD with subsequent extraction, CAD s/p CABG x 1, HTN, HLD, obesity, DM II, asthma, migraines, hypothyroidism, PTSD, anxiety and previous cocaine use (05/24).   Patient presented to ED in May of 2024 with chest pain. Had LHC which showed hazy eccentric 90% ostial LAD stenosis concerning for acute plaque rupture, tubular 40% proximal RCA stenosis large circumflex without significant disease. EF noted to be 30-35%. She underwent single vessel CABG, LIMA-LAD on 09/02/22.   Echo 11/24 EF 30-35% -> sICD placed 08/08/23  Admitted 6/25 with ICD infection and underwent device extraction on 09/05/23. I & D of wound done 10/06/23.  Was in the ED 11/03/23 with 2 weeks of RLQ pain, N/V. Having dysuria. Pelvic U/S done showed 12.4cm cystic lesion in region of left ovary. Seen on imaging in 2024 but was 7.4cm.  CT renal study was obtained from triage and shows a normal appendix. OB/gyn consulted.   She presents today for a HF follow-up visit with a chief complaint of shortness of breath. Has associated fatigue, sternal pain where device was explanted, occasional palpitations, occasional dizziness. Slight swelling around her left ankle. Has finished with the wound center.   Hasn't done [place] sleep study yet. Increased stress due to having to move.   ROS: All systems negative except what is listed in HPI, PMH and Problem List   Past Medical History:  Diagnosis Date   Aneurysm (HCC) 01/04/2014   Arthritis    Asthma    WELL CONTROLLED   Brain aneurysm 2007   NEUROLOGY NOTE DOES NOT MENTION ANEURYSM BUT PT STATES SHE DID NOT HAVE TO HAVE SURGERY   Complication of anesthesia    FOR 1 C-SECTION PT WAS ITCHING AND VERY RED ON HER FACE    Cough, persistent 01/27/2016   Dermatitis due to sunburn 11/08/2013   Family history of adverse reaction to anesthesia    brother, neice and nephew got red in face with hives   Gallstones    GERD (gastroesophageal reflux disease)    Gonorrhea 06/20/2020   Headache    CHRONIC HEADACHES   History of chronic cough    DRY   Loss of memory 05/30/2017   Perforation of left tympanic membrane 05/17/2013   Pneumonia    PTSD (post-traumatic stress disorder)    PTSD (post-traumatic stress disorder)    Thyroid  condition    PT WAS JUST TOLD ON 08-25-17 THAT SHE HAS A THYROID  PROBLEM AND IS GOING TO F/U WITH ENDOCRINOLOGIST IN 2 WEEKS   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     ALPRAZolam  (ALPRAZOLAM  XR) 0.5 MG 24 hr tablet TAKE 1 TABLET BY MOUTH TWICE DAILY AS NEEDED FOR ANXIETY OR SLEEP 60 tablet 2   aspirin  81 MG chewable tablet Chew 1 tablet (81 mg total) by mouth daily. (Patient not taking: No sig reported) 90 tablet 0   atorvastatin  (LIPITOR) 40 MG tablet Take 1 tablet (40 mg total) by mouth daily. 90 tablet 3   Biotin w/ Vitamins C & E (HAIR SKIN & NAILS GUMMIES PO) Take 1 tablet by mouth daily.  Biotin (Patient not taking: Reported on 12/07/2023)     cetirizine  (ZYRTEC ) 10 MG tablet Take 1 tablet (10 mg total) by mouth daily. (Patient taking differently: Take 10 mg by mouth at bedtime.) 90 tablet 1   clopidogrel  (PLAVIX ) 75 MG tablet Take 1 tablet (75 mg total) by mouth daily. (Patient not taking: No sig reported) 90 tablet 3   dapagliflozin  propanediol (FARXIGA ) 10 MG TABS tablet Take 1 tablet (10 mg total) by mouth daily before breakfast. (Patient not taking: Reported on 12/07/2023) 90 tablet 3   diphenhydrAMINE  (BENADRYL ) 25 MG tablet Take 50 mg by mouth every 6 (six) hours as needed (allergic reaction). Dye and glutin free      empagliflozin  (JARDIANCE ) 10 MG TABS tablet Take 1 tablet (10 mg total) by mouth daily before breakfast. 30 tablet 5   EPIPEN  2-PAK 0.3 MG/0.3ML SOAJ injection INJECT INTO THIGH MUSCLE THROUGH CLOTHES AS NEEDED SEVERE ALLERGIC REACTION 2 each 1   fluconazole  (DIFLUCAN ) 150 MG tablet Take 1 tablet (150 mg total) by mouth daily. 5 tablet 0   fluticasone  (FLONASE ) 50 MCG/ACT nasal spray 1 spray by Each Nare route daily. 16 g 6   furosemide  (LASIX ) 20 MG tablet Take 1 tablet (20 mg total) by mouth daily as needed. 30 tablet 2   linezolid  (ZYVOX ) 600 MG tablet Take 1 tablet (600 mg total) by mouth 2 (two) times daily. 28 tablet 0   methimazole  (TAPAZOLE ) 10 MG tablet Take 1 tablet (10 mg total) by mouth daily. (Patient taking differently: Take 15 mg by mouth daily. Total 15 mg) 90 tablet 1   metoprolol  succinate (TOPROL  XL) 25 MG 24 hr tablet Take 1 tablet (25 mg total) by mouth daily. 90 tablet 3   metroNIDAZOLE  (FLAGYL ) 500 MG tablet Take 1 tablet (500 mg total) by mouth 2 (two) times daily. 14 tablet 0   montelukast  (SINGULAIR ) 10 MG tablet Take 1 tablet (10 mg total) by mouth at bedtime. 90 tablet 1   naloxone  (NARCAN ) nasal spray 4 mg/0.1 mL Place 1 spray into the nose Once PRN for up to 1 dose (opioid overdose). 2 each 0   omeprazole  (PRILOSEC) 20 MG capsule Take 1 capsule (20 mg total) by mouth 2 (two) times daily. 180 capsule 1   ondansetron  (ZOFRAN -ODT) 4 MG disintegrating tablet Take 1 tablet (4 mg total) by mouth every 6 (six) hours as needed for nausea or vomiting. 20 tablet 0   spironolactone  (ALDACTONE ) 25 MG tablet Take 1 tablet (25 mg total) by mouth daily. 90 tablet 3   Ubrogepant  100 MG TABS Take 1 tablet by mouth See admin instructions. Take one tablet by mouth at onset of migraine. May take an additional tablet two hours later if migraine persists.  No more than 2 tablets in 24 hours.     No current facility-administered medications for this visit.    Allergies  Allergen Reactions    Azithromycin Swelling and Other (See Comments)   Clindamycin  Swelling   Codeine Hives and Other (See Comments)    Nausea/dizzy    Meloxicam  Other (See Comments)    Burns up from the inside    Neurontin [Gabapentin] Other (See Comments)    Makes her pass out and not remember what happened prior to taking med.   Penicillins Anaphylaxis    Patient allergic to all cillins Has patient had a PCN reaction causing immediate rash, facial/tongue/throat swelling, SOB or lightheadedness with hypotension: Yes Has patient had a PCN reaction causing severe rash involving  mucus membranes or skin necrosis: Yes Has patient had a PCN reaction that required hospitalization: No Has patient had a PCN reaction occurring within the last 10 years: Yes If all of the above answers are NO, then may proceed with Cephalosporin use.    Other Hives and Other (See Comments)    Berry flavored food and drinks, blue and red dye Can have Strawberry and Banana   Diphenhydramine  Hcl Rash and Other (See Comments)    Uses dye-free; allergic to dye in regular benadryl    Doxycycline Rash   Egg-Derived Products Rash      Social History   Socioeconomic History   Marital status: Single    Spouse name: Not on file   Number of children: Not on file   Years of education: Not on file   Highest education level: Not on file  Occupational History   Not on file  Tobacco Use   Smoking status: Every Day    Current packs/day: 0.50    Average packs/day: 0.5 packs/day for 18.0 years (9.0 ttl pk-yrs)    Types: Cigarettes    Passive exposure: Past   Smokeless tobacco: Never  Vaping Use   Vaping status: Never Used  Substance and Sexual Activity   Alcohol use: Not Currently   Drug use: Not Currently    Types: Marijuana, Cocaine   Sexual activity: Yes    Partners: Male    Birth control/protection: Surgical    Comment: Hysterectomy  Other Topics Concern   Not on file  Social History Narrative   Not on file    Social Drivers of Health   Financial Resource Strain: Not on file  Food Insecurity: No Food Insecurity (11/09/2023)   Hunger Vital Sign    Worried About Running Out of Food in the Last Year: Never true    Ran Out of Food in the Last Year: Never true  Transportation Needs: No Transportation Needs (11/09/2023)   PRAPARE - Administrator, Civil Service (Medical): No    Lack of Transportation (Non-Medical): No  Physical Activity: Not on file  Stress: Not on file  Social Connections: Unknown (08/08/2023)   Social Connection and Isolation Panel    Frequency of Communication with Friends and Family: More than three times a week    Frequency of Social Gatherings with Friends and Family: More than three times a week    Attends Religious Services: Not on file    Active Member of Clubs or Organizations: Not on file    Attends Banker Meetings: Not on file    Marital Status: Living with partner  Intimate Partner Violence: Not At Risk (11/09/2023)   Humiliation, Afraid, Rape, and Kick questionnaire    Fear of Current or Ex-Partner: No    Emotionally Abused: No    Physically Abused: No    Sexually Abused: No      Family History  Problem Relation Age of Onset   Hypertension Mother    Alcohol abuse Mother    Deep vein thrombosis Mother    Alcohol abuse Father    COPD Father    Emphysema Father    Heart disease Father    Vitals:   12/27/23 1342  BP: 120/79  Pulse: 93  SpO2: 100%  Weight: 259 lb (117.5 kg)   Wt Readings from Last 3 Encounters:  12/27/23 259 lb (117.5 kg)  12/06/23 257 lb 6.4 oz (116.8 kg)  11/23/23 252 lb 1.6 oz (114.4 kg)   Lab Results  Component Value Date   CREATININE 0.72 12/06/2023   CREATININE 0.66 11/02/2023   CREATININE 0.45 10/07/2023    PHYSICAL EXAM:  General: Well appearing.  Cor: No JVD. Regular rhythm, rate.  Lungs: clear Abdomen: soft, nontender, nondistended. Extremities: trace edema around left ankle Neuro:. Affect  pleasant   ASSESSMENT & PLAN:  1. Ischemic cardiomyopathy with chronic systolic HF - CABG with LIMA-LAD on 09/02/22.  - NYHA  II - Volume status ok - weight up 7 pounds from last visit here 6 weeks ago - Echo 02/24/23: EF 30-35%, G1DD, normal RV, moderate MR - [place] S-ICD implanted 08/08/23 - explanted 6/25 for wound infection - continue jardiance  10mg  daily  - continue furosemide  20mg  daily PRN - continue metoprolol  succinate 25mg  daily - continue spironolactone  25mg  daily - unable to tolerate losartan  due to hypotension - BP has been too low in the past for further GDMT - Can consider ivabradine down the road as needed - has not done her sleep study yet as she's under a lot of stress and only sleeping 3-4 hours over the course of the whole night - BMET 12/06/23 reviewed: sodium 142, potassium 4.4, creatinine 0.72 & GFR 108   2. ICD device infection - device explanted 6/25 - 10/06/23 I&D of the suprasternal and subxiphoid incisions done - saw EP Anice) 07/25 - seen at wound center 09/25. Patient says that she does not have to return anymore  3. CAD- - saw cardiology Dene) 10/24 - CABG with LIMA-LAD on 09/02/22.  - No s/s angina - continue ASA 81mg  daily   4. Obesity - patient agreeable to GLP1 if insurance covers. Denies history of medullary thyroid  cancer - wegovy  had been approved but patient says that her insurance told her that beginning 01/04/24, they will no longer cover wegovy . Have reached out to pharm MARYLN Shove) to see about options - saw PCP Nickey) 09/25  5. Pre-Diabetes - A1c 07/25/23 was 5.4% - saw endocrinology Leatrice) 08/25   Return in 3 months, sooner if needed.   I spent 30 minutes reviewing records, interviewing/ examing patient and managing plan/ orders.   Ellouise DELENA Class, FNP-C  12/27/23

## 2023-12-27 ENCOUNTER — Encounter: Payer: Self-pay | Admitting: Family

## 2023-12-27 ENCOUNTER — Ambulatory Visit: Payer: MEDICAID | Attending: Family | Admitting: Family

## 2023-12-27 VITALS — BP 120/79 | HR 93 | Wt 259.0 lb

## 2023-12-27 DIAGNOSIS — R072 Precordial pain: Secondary | ICD-10-CM | POA: Diagnosis not present

## 2023-12-27 DIAGNOSIS — T827XXD Infection and inflammatory reaction due to other cardiac and vascular devices, implants and grafts, subsequent encounter: Secondary | ICD-10-CM

## 2023-12-27 DIAGNOSIS — R5383 Other fatigue: Secondary | ICD-10-CM | POA: Diagnosis not present

## 2023-12-27 DIAGNOSIS — R0602 Shortness of breath: Secondary | ICD-10-CM | POA: Insufficient documentation

## 2023-12-27 DIAGNOSIS — E785 Hyperlipidemia, unspecified: Secondary | ICD-10-CM | POA: Insufficient documentation

## 2023-12-27 DIAGNOSIS — I255 Ischemic cardiomyopathy: Secondary | ICD-10-CM | POA: Insufficient documentation

## 2023-12-27 DIAGNOSIS — J45909 Unspecified asthma, uncomplicated: Secondary | ICD-10-CM | POA: Insufficient documentation

## 2023-12-27 DIAGNOSIS — Z7982 Long term (current) use of aspirin: Secondary | ICD-10-CM | POA: Insufficient documentation

## 2023-12-27 DIAGNOSIS — Z951 Presence of aortocoronary bypass graft: Secondary | ICD-10-CM | POA: Diagnosis not present

## 2023-12-27 DIAGNOSIS — R7303 Prediabetes: Secondary | ICD-10-CM | POA: Insufficient documentation

## 2023-12-27 DIAGNOSIS — I110 Hypertensive heart disease with heart failure: Secondary | ICD-10-CM | POA: Insufficient documentation

## 2023-12-27 DIAGNOSIS — F419 Anxiety disorder, unspecified: Secondary | ICD-10-CM | POA: Insufficient documentation

## 2023-12-27 DIAGNOSIS — I5022 Chronic systolic (congestive) heart failure: Secondary | ICD-10-CM | POA: Insufficient documentation

## 2023-12-27 DIAGNOSIS — E669 Obesity, unspecified: Secondary | ICD-10-CM | POA: Diagnosis not present

## 2023-12-27 DIAGNOSIS — Z7984 Long term (current) use of oral hypoglycemic drugs: Secondary | ICD-10-CM | POA: Insufficient documentation

## 2023-12-27 DIAGNOSIS — I2510 Atherosclerotic heart disease of native coronary artery without angina pectoris: Secondary | ICD-10-CM | POA: Insufficient documentation

## 2023-12-27 DIAGNOSIS — E66812 Obesity, class 2: Secondary | ICD-10-CM | POA: Diagnosis not present

## 2023-12-27 DIAGNOSIS — Z79899 Other long term (current) drug therapy: Secondary | ICD-10-CM | POA: Diagnosis not present

## 2023-12-27 DIAGNOSIS — E039 Hypothyroidism, unspecified: Secondary | ICD-10-CM | POA: Insufficient documentation

## 2023-12-27 DIAGNOSIS — F17210 Nicotine dependence, cigarettes, uncomplicated: Secondary | ICD-10-CM | POA: Insufficient documentation

## 2023-12-27 DIAGNOSIS — Z6839 Body mass index (BMI) 39.0-39.9, adult: Secondary | ICD-10-CM

## 2023-12-27 NOTE — Patient Instructions (Signed)
 It was good to see you today!  If you receive a satisfaction survey regarding the [facility], please take the time to fill it out. This way we can continue to provide excellent care and make any changes that need to be made.

## 2024-01-05 ENCOUNTER — Telehealth: Payer: Self-pay

## 2024-01-05 ENCOUNTER — Encounter (HOSPITAL_COMMUNITY): Payer: Self-pay | Admitting: Internal Medicine

## 2024-01-05 ENCOUNTER — Other Ambulatory Visit (HOSPITAL_COMMUNITY): Payer: Self-pay

## 2024-01-06 ENCOUNTER — Ambulatory Visit: Payer: MEDICAID | Admitting: Family

## 2024-01-06 ENCOUNTER — Encounter: Payer: Self-pay | Admitting: Family

## 2024-01-06 VITALS — BP 96/68 | HR 86 | Ht 67.0 in | Wt 254.0 lb

## 2024-01-06 DIAGNOSIS — Z0130 Encounter for examination of blood pressure without abnormal findings: Secondary | ICD-10-CM

## 2024-01-06 DIAGNOSIS — F32A Depression, unspecified: Secondary | ICD-10-CM

## 2024-01-06 DIAGNOSIS — E66812 Obesity, class 2: Secondary | ICD-10-CM

## 2024-01-06 DIAGNOSIS — M2550 Pain in unspecified joint: Secondary | ICD-10-CM

## 2024-01-06 DIAGNOSIS — R051 Acute cough: Secondary | ICD-10-CM | POA: Diagnosis not present

## 2024-01-06 DIAGNOSIS — J069 Acute upper respiratory infection, unspecified: Secondary | ICD-10-CM | POA: Diagnosis not present

## 2024-01-06 DIAGNOSIS — Z131 Encounter for screening for diabetes mellitus: Secondary | ICD-10-CM

## 2024-01-06 DIAGNOSIS — E0590 Thyrotoxicosis, unspecified without thyrotoxic crisis or storm: Secondary | ICD-10-CM

## 2024-01-06 DIAGNOSIS — R300 Dysuria: Secondary | ICD-10-CM | POA: Diagnosis not present

## 2024-01-06 DIAGNOSIS — Z6839 Body mass index (BMI) 39.0-39.9, adult: Secondary | ICD-10-CM

## 2024-01-06 DIAGNOSIS — F419 Anxiety disorder, unspecified: Secondary | ICD-10-CM

## 2024-01-06 LAB — POCT URINALYSIS DIPSTICK
Bilirubin, UA: NEGATIVE
Glucose, UA: POSITIVE — AB
Ketones, UA: NEGATIVE
Leukocytes, UA: NEGATIVE
Nitrite, UA: NEGATIVE
Protein, UA: NEGATIVE
Spec Grav, UA: 1.02 (ref 1.010–1.025)
Urobilinogen, UA: 0.2 U/dL
pH, UA: 5.5 (ref 5.0–8.0)

## 2024-01-06 LAB — POCT XPERT XPRESS SARS COVID-2/FLU/RSV
FLU A: NEGATIVE
FLU B: NEGATIVE
RSV RNA, PCR: NEGATIVE
SARS Coronavirus 2: NEGATIVE

## 2024-01-06 NOTE — Progress Notes (Signed)
 Established Patient Office Visit  Subjective:  Patient ID: Elizabeth Spencer, female    DOB: 1985/07/05  Age: 38 y.o. MRN: [id number]  Chief Complaint  Patient presents with   Follow-up    1 month follow up    Patient is here today for her 1 month follow up.   She has several concerns today:  1) She started a new medication as of yesterday from her psychiatry provider. She says that she started taking it and immediately had an allergic reaction started coughing, and her voice is hoarse.  He also told her that she would need to remain on the alprazolam  going forward for the time being.  2) She has been having continued issues with pain in her chest wound area.  She needs a referral to pain management if she is going to be continuing this issue, which she says they have told her will last for a few more months.  She is using the medication sparingly.  3) She has also been told from someone she saw that she should have us  check a rheumatoid arthritis panel.  She also needs her thyroid  labs drawn for Dr. Cherilyn.  4) She feels like she might have a UTI, she has been having some dysuria.    No other concerns at this time.   Past Medical History:  Diagnosis Date   Aneurysm 01/04/2014   Arthritis    Asthma    WELL CONTROLLED   Brain aneurysm 2007   NEUROLOGY NOTE DOES NOT MENTION ANEURYSM BUT PT STATES SHE DID NOT HAVE TO HAVE SURGERY   Complication of anesthesia    FOR 1 C-SECTION PT WAS ITCHING AND VERY RED ON HER FACE   Cough, persistent 01/27/2016   Dermatitis due to sunburn 11/08/2013   Family history of adverse reaction to anesthesia    brother, neice and nephew got red in face with hives   Gallstones    GERD (gastroesophageal reflux disease)    Gonorrhea 06/20/2020   Headache    CHRONIC HEADACHES   History of chronic cough    DRY   Loss of memory 05/30/2017   Perforation of left tympanic membrane 05/17/2013   Pneumonia    PTSD (post-traumatic stress disorder)     PTSD (post-traumatic stress disorder)    Thyroid  condition    PT WAS JUST TOLD ON 08-25-17 THAT SHE HAS A THYROID  PROBLEM AND IS GOING TO F/U WITH ENDOCRINOLOGIST IN 2 WEEKS   Trichomonas vaginalis (TV) infection 06/20/2020    Past Surgical History:  Procedure Laterality Date   ABDOMINAL HYSTERECTOMY     CESAREAN SECTION     x3   CORONARY ARTERY BYPASS GRAFT N/A 09/02/2022   Procedure: CORONARY ARTERY BYPASS GRAFTING (CABG) X 1 USING LEFT INTERNAL MAMMARY ARTERY;  Surgeon: Lucas Dorise POUR, MD;  Location: [facility];  Service: Open Heart Surgery;  Laterality: N/A;   CYSTOSCOPY N/A 09/29/2017   Procedure: CYSTOSCOPY;  Surgeon: Lake Read, MD;  Location: [facility] ORS;  Service: Gynecology;  Laterality: N/A;   CYSTOSCOPY N/A 11/05/2017   Procedure: CYSTOSCOPY;  Surgeon: Lake Read, MD;  Location: [facility] ORS;  Service: Gynecology;  Laterality: N/A;   HYSTEROSCOPY WITH D & C N/A 09/01/2017   Procedure: DILATATION AND CURETTAGE /HYSTEROSCOPY;  Surgeon: Lake Read, MD;  Location: [facility] ORS;  Service: Gynecology;  Laterality: N/A;   IMPLANT POCKET INCISION & DRAINAGE N/A 10/06/2023   Procedure: IMPLANT POCKET INCISION & DRAINAGE;  Surgeon: Kennyth Chew, MD;  Location: [facility] INVASIVE  CV LAB;  Service: Cardiovascular;  Laterality: N/A;   LAPAROSCOPY N/A 09/01/2017   Procedure: LAPAROSCOPY OPERATIVE with biopsy;  Surgeon: Lake Read, MD;  Location: [facility] ORS;  Service: Gynecology;  Laterality: N/A;   LAPAROSCOPY N/A 11/05/2017   Procedure: LAPAROSCOPY DIAGNOSTIC;  Surgeon: Lake Read, MD;  Location: [facility] ORS;  Service: Gynecology;  Laterality: N/A;   LEFT HEART CATH AND CORONARY ANGIOGRAPHY N/A 09/01/2022   Procedure: LEFT HEART CATH AND CORONARY ANGIOGRAPHY;  Surgeon: Mady Bruckner, MD;  Location: [facility] INVASIVE CV LAB;  Service: Cardiovascular;  Laterality: N/A;   REPAIR VAGINAL CUFF N/A 11/05/2017   Procedure: REPAIR VAGINAL CUFF;  Surgeon: Lake Read, MD;  Location:  [facility] ORS;  Service: Gynecology;  Laterality: N/A;   SUBQ ICD IMPLANT N/A 08/08/2023   Procedure: SUBQ ICD IMPLANT;  Surgeon: Kennyth Chew, MD;  Location: [facility] INVASIVE CV LAB;  Service: Cardiovascular;  Laterality: N/A;   SUBQ ICD REVISION N/A 08/09/2023   Procedure: SUBQ ICD REVISION;  Surgeon: Kennyth Chew, MD;  Location: [facility] INVASIVE CV LAB;  Service: Cardiovascular;  Laterality: N/A;   SUBQ ICD REVISION N/A 09/05/2023   Procedure: SUBQ ICD REVISION;  Surgeon: Kennyth Chew, MD;  Location: [facility] INVASIVE CV LAB;  Service: Cardiovascular;  Laterality: N/A;   TEE WITHOUT CARDIOVERSION N/A 09/02/2022   Procedure: TRANSESOPHAGEAL ECHOCARDIOGRAM;  Surgeon: Lucas Dorise POUR, MD;  Location: [facility] OR;  Service: Open Heart Surgery;  Laterality: N/A;   TOTAL LAPAROSCOPIC HYSTERECTOMY WITH SALPINGECTOMY Bilateral 09/29/2017   Procedure: TOTAL LAPAROSCOPIC HYSTERECTOMY WITH SALPINGECTOMY;  Surgeon: Lake Read, MD;  Location: [facility] ORS;  Service: Gynecology;  Laterality: Bilateral;    Social History   Socioeconomic History   Marital status: Single    Spouse name: Not on file   Number of children: Not on file   Years of education: Not on file   Highest education level: Not on file  Occupational History   Not on file  Tobacco Use   Smoking status: Every Day    Current packs/day: 0.50    Average packs/day: 0.5 packs/day for 18.0 years (9.0 ttl pk-yrs)    Types: Cigarettes    Passive exposure: Past   Smokeless tobacco: Never  Vaping Use   Vaping status: Never Used  Substance and Sexual Activity   Alcohol use: Not Currently   Drug use: Not Currently    Types: Marijuana, Cocaine   Sexual activity: Yes    Partners: Male    Birth control/protection: Surgical    Comment: Hysterectomy  Other Topics Concern   Not on file  Social History Narrative   Not on file   Social Drivers of Health   Financial Resource Strain: Not on file  Food Insecurity: No Food Insecurity (11/09/2023)   Hunger  Vital Sign    Worried About Running Out of Food in the Last Year: Never true    Ran Out of Food in the Last Year: Never true  Transportation Needs: No Transportation Needs (11/09/2023)   PRAPARE - Administrator, Civil Service (Medical): No    Lack of Transportation (Non-Medical): No  Physical Activity: Not on file  Stress: Not on file  Social Connections: Unknown (08/08/2023)   Social Connection and Isolation Panel    Frequency of Communication with Friends and Family: More than three times a week    Frequency of Social Gatherings with Friends and Family: More than three times a week    Attends Religious Services: Not on file    Active Member of  Clubs or Organizations: Not on file    Attends Club or Organization Meetings: Not on file    Marital Status: Living with partner  Intimate Partner Violence: Not At Risk (11/09/2023)   Humiliation, Afraid, Rape, and Kick questionnaire    Fear of Current or Ex-Partner: No    Emotionally Abused: No    Physically Abused: No    Sexually Abused: No    Family History  Problem Relation Age of Onset   Hypertension Mother    Alcohol abuse Mother    Deep vein thrombosis Mother    Alcohol abuse Father    COPD Father    Emphysema Father    Heart disease Father     Allergies  Allergen Reactions   Azithromycin Swelling and Other (See Comments)   Clindamycin  Swelling   Codeine Hives and Other (See Comments)    Nausea/dizzy    Meloxicam  Other (See Comments)    Burns up from the inside    Neurontin [Gabapentin] Other (See Comments)    Makes her pass out and not remember what happened prior to taking med.   Penicillins Anaphylaxis    Patient allergic to all cillins Has patient had a PCN reaction causing immediate rash, facial/tongue/throat swelling, SOB or lightheadedness with hypotension: Yes Has patient had a PCN reaction causing severe rash involving mucus membranes or skin necrosis: Yes Has patient had a PCN reaction that required  hospitalization: No Has patient had a PCN reaction occurring within the last 10 years: Yes If all of the above answers are NO, then may proceed with Cephalosporin use.    Other Hives and Other (See Comments)    Berry flavored food and drinks, blue and red dye Can have Strawberry and Banana   Diphenhydramine  Hcl Rash and Other (See Comments)    Uses dye-free; allergic to dye in regular benadryl    Doxycycline Rash   Egg-Derived Products Rash    Review of Systems  Genitourinary:  Positive for dysuria.  All other systems reviewed and are negative.      Objective:   BP 96/68   Pulse 86   Ht 5' 7 (1.702 m)   Wt 254 lb (115.2 kg)   LMP 08/22/2017 Comment: says she just spots.  SpO2 98%   BMI 39.78 kg/m   Vitals:   01/06/24 1318  BP: 96/68  Pulse: 86  Height: 5' 7 (1.702 m)  Weight: 254 lb (115.2 kg)  SpO2: 98%  BMI (Calculated): 39.77    Physical Exam Vitals and nursing note reviewed.  Constitutional:      Appearance: Normal appearance. She is normal weight.  HENT:     Head: Normocephalic.  Eyes:     Extraocular Movements: Extraocular movements intact.     Conjunctiva/sclera: Conjunctivae normal.     Pupils: Pupils are equal, round, and reactive to light.  Cardiovascular:     Rate and Rhythm: Normal rate.  Pulmonary:     Effort: Pulmonary effort is normal.  Neurological:     General: No focal deficit present.     Mental Status: She is alert and oriented to person, place, and time. Mental status is at baseline.  Psychiatric:        Mood and Affect: Mood normal.        Behavior: Behavior normal.        Thought Content: Thought content normal.        Judgment: Judgment normal.      No results found for any visits on 01/06/24.  Recent Results (from the past 2160 hours)  CBC with Differential     Status: Abnormal   Collection Time: 11/02/23 11:15 PM  Result Value Ref Range   WBC 10.4 4.0 - 10.5 K/uL   RBC 5.19 (H) 3.87 - 5.11 MIL/uL   Hemoglobin 15.7  (H) 12.0 - 15.0 g/dL   HCT 53.0 (H) 63.9 - 53.9 %   MCV 90.4 80.0 - 100.0 fL   MCH 30.3 26.0 - 34.0 pg   MCHC 33.5 30.0 - 36.0 g/dL   RDW 86.2 88.4 - 84.4 %   Platelets 288 150 - 400 K/uL   nRBC 0.0 0.0 - 0.2 %   Neutrophils Relative % 52 %   Neutro Abs 5.4 1.7 - 7.7 K/uL   Lymphocytes Relative 35 %   Lymphs Abs 3.6 0.7 - 4.0 K/uL   Monocytes Relative 9 %   Monocytes Absolute 0.9 0.1 - 1.0 K/uL   Eosinophils Relative 3 %   Eosinophils Absolute 0.3 0.0 - 0.5 K/uL   Basophils Relative 1 %   Basophils Absolute 0.1 0.0 - 0.1 K/uL   Immature Granulocytes 0 %   Abs Immature Granulocytes 0.03 0.00 - 0.07 K/uL    Comment: Performed at [facility], [address] [place], [place] [address]  Comprehensive metabolic panel     Status: Abnormal   Collection Time: 11/02/23 11:15 PM  Result Value Ref Range   Sodium 137 135 - 145 mmol/L   Potassium 3.3 (L) 3.5 - 5.1 mmol/L   Chloride 107 98 - 111 mmol/L   CO2 21 (L) 22 - 32 mmol/L   Glucose, Bld 109 (H) 70 - 99 mg/dL    Comment: Glucose reference range applies only to samples taken after fasting for at least 8 hours.   BUN 8 6 - 20 mg/dL   Creatinine, Ser 9.33 0.44 - 1.00 mg/dL   Calcium  9.2 8.9 - 10.3 mg/dL   Total Protein 6.9 6.5 - 8.1 g/dL   Albumin  3.9 3.5 - 5.0 g/dL   AST 18 15 - 41 U/L   ALT 16 0 - 44 U/L   Alkaline Phosphatase 77 38 - 126 U/L   Total Bilirubin 0.5 0.0 - 1.2 mg/dL   GFR, Estimated >39 >39 mL/min    Comment: (NOTE) Calculated using the CKD-EPI Creatinine Equation (2021)    Anion gap 9 5 - 15    Comment: Performed at [facility], [address] [place], [place] [address]  Urinalysis, Routine w reflex microscopic -Urine, Clean Catch     Status: Abnormal   Collection Time: 11/03/23  2:25 AM  Result Value Ref Range   Color, Urine YELLOW (A) YELLOW   APPearance HAZY (A) CLEAR   Specific Gravity, Urine 1.028 1.005 - 1.030   pH 5.0 5.0 - 8.0   Glucose, UA >=500 (A) NEGATIVE mg/dL   Hgb urine  dipstick NEGATIVE NEGATIVE   Bilirubin Urine NEGATIVE NEGATIVE   Ketones, ur NEGATIVE NEGATIVE mg/dL   Protein, ur NEGATIVE NEGATIVE mg/dL   Nitrite NEGATIVE NEGATIVE   Leukocytes,Ua NEGATIVE NEGATIVE   RBC / HPF 0-5 0 - 5 RBC/hpf   WBC, UA 0-5 0 - 5 WBC/hpf   Bacteria, UA RARE (A) NONE SEEN   Squamous Epithelial / HPF 11-20 0 - 5 /HPF   Mucus PRESENT     Comment: Performed at [facility], [address] [place], [place] [address]  CEA     Status: None   Collection Time:  11/09/23 11:22 AM  Result Value Ref Range   CEA 2.3 0.0 - 4.7 ng/mL    Comment: (NOTE)                             Nonsmokers          <3.9                             Smokers             <5.6 Roche [employer] Electrochemiluminescence Immunoassay (ECLIA) Values obtained with different assay methods or kits cannot be used interchangeably.  Results cannot be interpreted as absolute evidence of the presence or absence of malignant disease. Performed At: [facility] [address] [place], [place] [id number] Jennette Shorter MD Ey:[id number]   Inhibin A     Status: None   Collection Time: 11/09/23 11:22 AM  Result Value Ref Range   Inhibin-A 28.8 pg/mL    Comment: (NOTE)                       Menstrual Phase                         Early Follicular        <34.0                         Late Follicular         <99.0                         Periovulatory       8.0-233.0                         MidLuteal              <145.0                         End Luteal             <145.0                         Postmenopausal           <4.0 Inhibin A performed by [employer] Access Automated Immunoassay methodology. Values obtained with different assay methods or kits cannot be used interchangeably. Performed At: [facility] [address] [place], [place] [id number] Jennette Shorter MD Ey:[id number]   Human Epididymis Prot 4,Serial     Status: None   Collection Time: 11/09/23 11:22 AM   Result Value Ref Range   HE4 58.5 0.0 - 63.6 pmol/L    Comment: (NOTE) Roche [employer]ostics Electrochemiluminescence Immunoassay (ECLIA) Values obtained with different assay methods or kits cannot be used interchangeably.  Results cannot be interpreted as absolute evidence of the presence or absence of malignant disease. Performed At: [facility] [address] [place], [place] [id number] Jennette Shorter MD Ey:[id number]   Inhibin B     Status: None   Collection Time: 11/09/23 11:22 AM  Result Value Ref Range   Inhibin B 53.7 pg/mL    Comment: (NOTE)                     Early Follicular           <  261.0                     Late Follicular            <286.0                     Periovulatory              <189.0                     MidLuteal                  <164.0                     End Luteal                 <107.0                     Post Menopausal            < 17.0 This test was developed and its performance characteristics determined by [facility]. It has not been cleared or approved by the Food and Drug Administration. Inhibin B performed by [employer](TM) Enzyme Linked Immunoassay methodology. Values obtained with different assay methods or kits cannot be used interchangeably. Performed At: [facility] [address] [place], [place] [id number] Jennette Shorter MD Ey:[id number]   CA 125     Status: Abnormal   Collection Time: 11/09/23 11:22 AM  Result Value Ref Range   Cancer Antigen (CA) 125 59.2 (H) 0.0 - 38.1 U/mL    Comment: (NOTE) [employer]ostics Electrochemiluminescence Immunoassay (ECLIA) Values obtained with different assay methods or kits cannot be used interchangeably.  Results cannot be interpreted as absolute evidence of the presence or absence of malignant disease. Performed At: [facility] [address] [place], [place] [id number] Jennette Shorter MD Ey:[id number]   Lipid panel     Status: Abnormal   Collection Time: 12/06/23  2:43  PM  Result Value Ref Range   Cholesterol, Total 146 100 - 199 mg/dL   Triglycerides 816 (H) 0 - 149 mg/dL   HDL 48 >60 mg/dL   VLDL Cholesterol Cal 31 5 - 40 mg/dL   LDL Chol Calc (NIH) 67 0 - 99 mg/dL   Chol/HDL Ratio 3.0 0.0 - 4.4 ratio    Comment:                                   T. Chol/HDL Ratio                                             Men  Women                               1/2 Avg.Risk  3.4    3.3                                   Avg.Risk  5.0    4.4  2X Avg.Risk  9.6    7.1                                3X Avg.Risk 23.4   11.0   VITAMIN D  25 Hydroxy (Vit-D Deficiency, Fractures)     Status: Abnormal   Collection Time: 12/06/23  2:43 PM  Result Value Ref Range   Vit D, 25-Hydroxy 18.1 (L) 30.0 - 100.0 ng/mL    Comment: Vitamin D  deficiency has been defined by the Institute of Medicine and an Endocrine Society practice guideline as a level of serum 25-OH vitamin D  less than 20 ng/mL (1,2). The Endocrine Society went on to further define vitamin D  insufficiency as a level between 21 and 29 ng/mL (2). 1. IOM (Institute of Medicine). 2010. Dietary reference    intakes for calcium  and D. [place]: The    [employer]. 2. Holick MF, Binkley , Bischoff-Ferrari HA, et al.    Evaluation, treatment, and prevention of vitamin D     deficiency: an Endocrine Society clinical practice    guideline. JCEM. 2011 Jul; 96(7):1911-30.   CMP14+EGFR     Status: Abnormal   Collection Time: 12/06/23  2:43 PM  Result Value Ref Range   Glucose 81 70 - 99 mg/dL   BUN 10 6 - 24 mg/dL   Creatinine, Ser 9.27 0.57 - 1.00 mg/dL   eGFR 891 >40 fO/fpw/8.26   BUN/Creatinine Ratio 14 9 - 23   Sodium 142 134 - 144 mmol/L   Potassium 4.4 3.5 - 5.2 mmol/L   Chloride 107 (H) 96 - 106 mmol/L   CO2 19 (L) 20 - 29 mmol/L   Calcium  9.2 8.7 - 10.2 mg/dL   Total Protein 6.8 6.0 - 8.5 g/dL   Albumin  4.2 3.9 - 4.9 g/dL   Globulin, Total 2.6 1.5 - 4.5 g/dL    Bilirubin Total 0.2 0.0 - 1.2 mg/dL   Alkaline Phosphatase 88 44 - 121 IU/L    Comment: **Effective December 19, 2023 Alkaline Phosphatase**   reference interval will be changing to:              Age                Female          Female           0 -  5 days         47 - 127       47 - 127           6 - 10 days         29 - 242       29 - 242          11 - 20 days        109 - 357      109 - 357          21 - 30 days         94 - 494       94 - 494           1 -  2 months      149 - 539      149 - 539           3 -  6 months      131 - 452      131 - 452  7 - 11 months      117 - 401      117 - 401   12 months -  6 years       158 - 369      158 - 369           7 - 12 years       150 - 409      150 - 409               13 years       156 - 435       78 - 227               14 years       114 - 375       64 - 161               15 years        88 - 279       56 - 134               16 years        74 - 207       51 - 121               17 years        63 - 161       47 - 113          18 - 20 years        51 - 125       42 - 106          21 - 50 years         47 - 123       41 - 116          51 - 80 years        49 - 135       51 - 125              >80 years        48 - 129       48 - 129    AST 16 0 - 40 IU/L   ALT 16 0 - 32 IU/L  TSH     Status: None   Collection Time: 12/06/23  2:43 PM  Result Value Ref Range   TSH 0.733 0.450 - 4.500 uIU/mL  Hemoglobin A1c     Status: None   Collection Time: 12/06/23  2:43 PM  Result Value Ref Range   Hgb A1c MFr Bld 5.3 4.8 - 5.6 %    Comment:          Prediabetes: 5.7 - 6.4          Diabetes: >6.4          Glycemic control for adults with diabetes: <7.0    Est. average glucose Bld gHb Est-mCnc 105 mg/dL  Vitamin B12     Status: None   Collection Time: 12/06/23  2:43 PM  Result Value Ref Range   Vitamin B-12 CANCELED pg/mL    Comment: [facility] was unable to collect sufficient specimen to perform the following test(s), and is  providing the patient with re-collection instructions.  Result canceled by the ancillary.   CBC with Diff     Status: Abnormal   Collection Time: 12/06/23  2:43 PM  Result Value Ref Range   WBC  9.1 3.4 - 10.8 x10E3/uL   RBC 5.15 3.77 - 5.28 x10E6/uL   Hemoglobin 15.6 11.1 - 15.9 g/dL   Hematocrit 52.5 (H) 65.9 - 46.6 %   MCV 92 79 - 97 fL   MCH 30.3 26.6 - 33.0 pg   MCHC 32.9 31.5 - 35.7 g/dL   RDW 86.7 88.2 - 84.5 %   Platelets 190 150 - 450 x10E3/uL   Neutrophils 51 Not Estab. %   Lymphs 37 Not Estab. %   Monocytes 9 Not Estab. %   Eos 2 Not Estab. %   Basos 1 Not Estab. %   Neutrophils Absolute 4.5 1.4 - 7.0 x10E3/uL   Lymphocytes Absolute 3.4 (H) 0.7 - 3.1 x10E3/uL   Monocytes Absolute 0.9 0.1 - 0.9 x10E3/uL   EOS (ABSOLUTE) 0.2 0.0 - 0.4 x10E3/uL   Basophils Absolute 0.1 0.0 - 0.2 x10E3/uL   Immature Granulocytes 0 Not Estab. %   Immature Grans (Abs) 0.0 0.0 - 0.1 x10E3/uL  Cytology - Non PAP; Right ovarian cyst     Status: None   Collection Time: 12/07/23 12:00 AM  Result Value Ref Range   CYTOLOGY - NON GYN      CYTOLOGY - NON PAP [facility] [address], [address] [place], [place] [address] Telephone [phone] or [phone] Fax [phone]  CYTOPATHOLOGY REPORT   Accession #: [id number] Patient Name: GIAVANA, ROOKE Visit # : [id number]  MRN: [id number] Physician: Philip Cornet DOB/Age [birth year] (Age: 83) Gender: F Collected Date: 12/07/2023 Received Date: 12/08/2023  FINAL DIAGNOSIS STATEMENT Of SPECIMEN ADEQUACY:  INTERPRETATION(S):      NEGATIVE FOR MALIGNANCY      SCATTERED MACROPHAGES CONSISTENT WITH BENIGN CYST CONTENTS      DATE SIGNED OUT: 12/09/2023 ELECTRONIC SIGNATURE : Picklesimer Md, Prentice , Sports administrator, Electronic Signature   CASE COMMENTS   CLINICAL HISTORY  SOURCE OF SPECIMEN(S) Ovary Cyst Fluid  SPECIMEN COMMENTS: SPECIMEN CLINICAL INFORMATION: 1. Symptomatic right ovarian cyst    Gross  Description Specimen: Received is/are 15cc of brown fluid in a sterile syringe and two slides      Prepared:      # Smear s: 2      # Concentration Technique Slides (i.e. ThinPrep): 1      # Cell Block: 1      # Diff-Quick Stain: 0        Report signed out from the following location(s) [place] [address] [place], [place] [address] CLIA #: [id number]  [facility] [address] [place], [place] [address] CLIA #: [id number]   CBC     Status: None   Collection Time: 12/07/23 10:30 AM  Result Value Ref Range   WBC 7.7 4.0 - 10.5 K/uL   RBC 5.00 3.87 - 5.11 MIL/uL   Hemoglobin 14.9 12.0 - 15.0 g/dL   HCT 54.6 63.9 - 53.9 %   MCV 90.6 80.0 - 100.0 fL   MCH 29.8 26.0 - 34.0 pg   MCHC 32.9 30.0 - 36.0 g/dL   RDW 85.8 88.4 - 84.4 %   Platelets 224 150 - 400 K/uL   nRBC 0.0 0.0 - 0.2 %    Comment: Performed at [facility], [address] [place], [place] [address]  Protime-INR     Status: None   Collection Time: 12/07/23 10:30 AM  Result Value Ref Range   Prothrombin Time 13.6 11.4 - 15.2 seconds   INR 1.0 0.8 -  1.2    Comment: (NOTE) INR goal varies based on device and disease states. Performed at [facility], [address] [place], [place] [address]        Assessment & Plan Acute upper respiratory infection Acute cough COVID/ Flu/ RSV testing today negative.  I have suggested that she contact her psychiatry provider and let him know that she thinks the medication is causing her reaction.   Dysuria UA in office today WNL.  Will reassess at follow up as needed.   Hyperthyroidism Checking labs today.  Will call with results and will forward to Dr. Cherilyn.   Polyarthralgia RA profile ordered today.  Will call with results when available.   Anxiety and depression Anxiety Patient is seen by Psychiatry, who manage this condition.  She is well controlled with current therapy.   Will defer  to them for further changes to plan of care.  Class 2 severe obesity with serious comorbidity and body mass index (BMI) of 39.0 to 39.9 in adult, unspecified obesity type Continue current meds.  Will adjust as needed based on results.  The patient is asked to make an attempt to improve diet and exercise patterns to aid in medical management of this problem. Addressed importance of increasing and maintaining water intake.     Return in about 2 months (around 03/07/2024).   Total time spent: 20 minutes  ALAN CHRISTELLA ARRANT, FNP  01/06/2024   This document may have been prepared by [facility] Voice Recognition software and as such may include unintentional dictation errors.

## 2024-01-06 NOTE — Telephone Encounter (Signed)
 Advanced Heart Failure Patient Advocate Encounter  Prior authorization for Wegovy  (Key [id number]) submitted and denied due to pt age <45.  This plan now requires a BMI greater than 40 with age over 41 for Wegovy  for the indication of cardioprotection.   Patient would still need to complete sleep study to confirm OSA before a prior auth for Zepbound could be submitted.  Rachel DEL, CPhT Rx Patient Advocate Phone: [phone]

## 2024-01-07 ENCOUNTER — Encounter: Payer: Self-pay | Admitting: Family

## 2024-01-07 NOTE — Assessment & Plan Note (Signed)
 Checking labs today.  Will call with results and will forward to Dr. Cherilyn.

## 2024-01-07 NOTE — Assessment & Plan Note (Signed)
 Patient is seen by Psychiatry, who manage this condition.  She is well controlled with current therapy.   Will defer to them for further changes to plan of care.

## 2024-01-07 NOTE — Assessment & Plan Note (Signed)
 Continue current meds.  Will adjust as needed based on results.  The patient is asked to make an attempt to improve diet and exercise patterns to aid in medical management of this problem. Addressed importance of increasing and maintaining water  intake.

## 2024-01-09 LAB — RHEUMATOID ARTHRITIS PROFILE
Cyclic Citrullin Peptide Ab: 6 U (ref 0–19)
Rheumatoid fact SerPl-aCnc: <10 [IU]/mL (ref <14.0)

## 2024-01-09 LAB — TSH+T4F+T3FREE
Free T4: 0.8 ng/dL — ABNORMAL LOW (ref 0.82–1.77)
T3, Free: 3.1 pg/mL (ref 2.0–4.4)
TSH: 0.914 u[IU]/mL (ref 0.450–4.500)

## 2024-01-14 ENCOUNTER — Telehealth: Payer: Self-pay | Admitting: Cardiology

## 2024-01-14 NOTE — Telephone Encounter (Signed)
 Patient called the answering service this morning reporting that she feels like she cannot breathe, has chest pain, feels like she is dizzy/going to pass out every time she stands to try to walk.  She has a history of ischemic cardiomyopathy, CAD.  Previously had an ICD but this was extracted in 09/2023 due to infection.  Patient tells me that she has been feeling under the weather for about a week now.  Has had a cough and shortness of breath.  Last week went to her primary care provider and was tested for COVID and flu which were reportedly negative.  Her symptoms have worsened today and she feels like she cannot breathe.  Yesterday she tried to sit on her porch in the cold and started to have chest pain.  Has continued to have episodes of chest pain today.  She has also been feeling very dizzy/lightheaded.  Feels like when she is tries to walk around she has to grab the walls due to this dizziness.  Patient asked what her etiology of her shortness of breath could be.  I explained that my examination is very limited over the phone as I am not able to perform a physical exam or order lab work.  Instructed patient that she should be seen in the ED today given chest pain, shortness of breath with cardiac history.  Discussed that it is possible she has an upper respiratory infection, however I cannot exclude any cardiac conditions contributing to her symptoms.  Patient tells me that she would prefer to avoid going to the ED.  Asked that she go to urgent care.  Discussed that at urgent care, they often refer chest pain cases to the ED for further evaluation.  She asked if she could go to the ED tomorrow if her symptoms do not resolve.  Discussed that as she is having active symptoms, would be best for her to go to the emergency department today.  Patient agreed to go to the ED at [facility] for further evaluation.

## 2024-01-16 ENCOUNTER — Telehealth: Payer: Self-pay

## 2024-01-16 NOTE — Progress Notes (Signed)
 Pt called stating she is having intermittent chest pain. Advised pt to go to ED. Pt stated that she thinks it could just be because it's cold outside and she had her device removed recently. Reiterated that pt go to ED due to active chest pain. Also, advised pt to call Dr. Shaune office, since they are who performed her device removal, since she thiks it could be related to that. Pt refused to go to ED, but agreed to reach out to Dr. Shaune office and had no further questions.

## 2024-02-13 NOTE — Progress Notes (Addendum)
 Electrophysiology Office Note:   Date:  02/15/2024  ID:  Elizabeth Spencer, DOB [birth year], MRN [id number]  Primary Cardiologist: Lonni Hanson, MD Electrophysiologist: Fonda Kitty, MD      History of Present Illness:   Elizabeth Spencer is a 38 y.o. 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 She is being seen today for follow up.  Discussed the use of AI scribe software for clinical note transcription with the patient, who gave verbal consent to proceed.  History of Present Illness Elizabeth Spencer is a 38 year old female with a history of defibrillator implantation who presents with concerns about incision healing and pain management.  Her S-ICD incisions have healed relatively well. She stopped seeing the wound care team about two months ago. She experiences significant pain at times, which she manages with tramadol  as needed, although her primary care provider recently prescribed Vicodin instead. She is frustrated with the pain management and the need for effective medication.  She is concerned about the defibrillator and the potential need for reimplantation. She mentions a previous bypass surgery and blockage, and the need to assess scarring in the heart muscle. She is claustrophobic and unable to undergo an MRI, but is open to a PET scan to evaluate the scarring.  She experiences difficulty breathing in cold weather. She has an albuterol  inhaler but is hesitant to use it due to its side effects. She also mentions a pinched nerve and issues with her left shoulder, which limit her ability to lift and move her arm.  She reports  waking up last night with a bad cough and congestion. A couple of days ago, she was congested without a cough, but the cough started suddenly last night. She also mentions a sore throat and ear pain, suspecting sinus congestion and drainage as the cause. No fever, but she does not have a thermometer at home to confirm. She is dealing with an ear infection that she believes is contributing to her current symptoms.  She recently moved and has been staying at home. She smokes cigarettes but reports reduced smoking due to cold weather.  Review of systems complete and found to be negative unless listed in HPI.   EP Information / Studies Reviewed:    EKG is not ordered today. EKG from 09/01/23 reviewed which showed sinus tach.      Echo 02/24/23:  1. Left ventricular ejection fraction, by estimation, is 30 to 35%. The  left ventricle has moderately decreased function. The left ventricle  demonstrates global hypokinesis. Left ventricular diastolic parameters are  consistent with Grade I diastolic  dysfunction (impaired relaxation). The average left ventricular global  longitudinal strain is -9.4 %.   2. Right ventricular systolic function is normal. The right ventricular  size is normal. Tricuspid regurgitation signal is inadequate for assessing  PA pressure.   3. The mitral valve is normal in structure. Moderate mitral valve  regurgitation. No evidence of mitral stenosis.   4. The aortic valve is normal in structure. Aortic valve regurgitation is  not visualized. No aortic stenosis is present.   5. The inferior vena cava is normal in size with greater than 50%  respiratory variability, suggesting right atrial pressure  of 3 mmHg.         Physical Exam:   VS:  BP 118/80 (BP Location: Left Arm, Patient Position: Sitting, Cuff Size: Large)   Pulse 98   Ht 5' 7 (1.702 m)   Wt 257 lb 6 oz (116.7 kg)   LMP 08/22/2017 Comment: says she just spots.  SpO2 98%   BMI 40.31 kg/m    Wt Readings  from Last 3 Encounters:  02/14/24 257 lb 6 oz (116.7 kg)  01/06/24 254 lb (115.2 kg)  12/27/23 259 lb (117.5 kg)     GEN: Well nourished, well developed in no acute distress NECK: No JVD CARDIAC: Normal rate, regular rhythm. RESPIRATORY:  Clear to auscultation without rales, wheezing or rhonchi  ABDOMEN: Soft, non-distended EXTREMITIES:  No edema; No deformity SKIN: Well healed suprasternal, subxiphoid and mid-axillary incisions  ASSESSMENT AND PLAN:    #. 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 Biggest question is regarding reimplant. We discussed that her indication for ICD is primary prevention. Would need a transvenous device based on difficulty of wound healing and infection with S-ICD. Educated her that transvenous ICD has it's own infectious risks. We discussed performing a PET scan to evaluate scar burden to better assess risk. Unfortunately, she cannot have an MRI d/t claustrophobia.  #.  Chronic systolic heart failure: EF remains less than 35% greater than 90 days after revascularization despite optimal medical therapy. NYHA II symptoms at baseline. Well compensated.  #.  Ischemic cardiomyopathy: -PET scan to assess scar burden. -Continue GDMT - Farxiga  10 mg once daily, metoprolol  XL 25 mg once daily, spironolactone  25 mg once daily - and follow-up with primary cardiologist. -Lasix  20mg  as needed.  -Follow up with HF team.   #.  CAD status post 1vCABG (LIMA-LAD): Denies anginal pain.  -Continue aspirin  81mg  once daily, clopidogrel  75mg  once daily, atorvastatin  40mg  once daily.   Total time of encounter: 50 minutes total time of encounter, including face-to-face patient care, coordination of care and counseling regarding high complexity medical decision making re: ICD infection.  Signed, Fonda Kitty, MD

## 2024-02-14 ENCOUNTER — Ambulatory Visit: Payer: MEDICAID | Attending: Cardiology | Admitting: Cardiology

## 2024-02-14 ENCOUNTER — Encounter: Payer: Self-pay | Admitting: Cardiology

## 2024-02-14 VITALS — BP 118/80 | HR 98 | Ht 67.0 in | Wt 257.4 lb

## 2024-02-14 DIAGNOSIS — I2510 Atherosclerotic heart disease of native coronary artery without angina pectoris: Secondary | ICD-10-CM | POA: Diagnosis not present

## 2024-02-14 DIAGNOSIS — T827XXD Infection and inflammatory reaction due to other cardiac and vascular devices, implants and grafts, subsequent encounter: Secondary | ICD-10-CM | POA: Diagnosis not present

## 2024-02-14 DIAGNOSIS — I255 Ischemic cardiomyopathy: Secondary | ICD-10-CM | POA: Diagnosis not present

## 2024-02-14 DIAGNOSIS — I5022 Chronic systolic (congestive) heart failure: Secondary | ICD-10-CM

## 2024-02-14 MED ORDER — LORAZEPAM 0.5 MG PO TABS: 0.5000 mg | ORAL_TABLET | Freq: Once | ORAL | 0 refills | Status: DC

## 2024-02-14 MED ORDER — LORAZEPAM 0.5 MG PO TABS: 0.5000 mg | ORAL_TABLET | Freq: Once | ORAL | 0 refills | Status: AC

## 2024-02-14 MED ORDER — TRAMADOL HCL 50 MG PO TABS: 50.0000 mg | ORAL_TABLET | Freq: Two times a day (BID) | ORAL | 0 refills | Status: DC | PRN

## 2024-02-14 NOTE — Patient Instructions (Signed)
 Medication Instructions:  Your physician recommends that you continue on your current medications as directed. Please refer to the Current Medication list given to you today.  *If you need a refill on your cardiac medications before your next appointment, please call your pharmacy*  Testing/Procedures: Cardiac PET stress - see below for instructions   Follow-Up: At [facility], you and your health needs are our priority.  As part of our continuing mission to provide you with exceptional heart care, our providers are all part of one team.  This team includes your primary Cardiologist (physician) and Advanced Practice Providers or APPs (Physician Assistants and Nurse Practitioners) who all work together to provide you with the care you need, when you need it.  Your next appointment:   Based on results of testing  Provider:   Fonda Kitty, MD      Please report to Radiology at the [facility] Main Entrance 30 minutes early for your test.  [address] [place], [place] [address]                         OR   Please report to Radiology at [facility] Main Entrance, medical mall, 30 mins prior to your test.  [address]  [place], [place]  How to Prepare for Your Cardiac PET/CT Stress Test:  Nothing to eat or drink, except water, 3 hours prior to arrival time.  NO caffeine/decaffeinated products, or chocolate 12 hours prior to arrival. (Please note decaffeinated beverages (teas/coffees) still contain caffeine).  If you have caffeine within 12 hours prior, the test will need to be rescheduled.  Medication instructions: Do not take erectile dysfunction medications for 72 hours prior to test (sildenafil, tadalafil) Do not take nitrates (isosorbide mononitrate, Ranexa) the day before or day of test Do not take tamsulosin the day before or morning of test Hold theophylline containing medications for 12 hours. Hold Dipyridamole 48 hours  prior to the test.  Diabetic Preparation: If able to eat breakfast prior to 3 hour fasting, you may take all medications, including your insulin . Do not worry if you miss your breakfast dose of insulin  - start at your next meal. If you do not eat prior to 3 hour fast-Hold all diabetes (oral and insulin ) medications. Patients who wear a continuous glucose monitor MUST remove the device prior to scanning.  You may take your remaining medications with water.  NO perfume, cologne or lotion on chest or abdomen area. FEMALES - Please avoid wearing dresses to this appointment.  Total time is 1 to 2 hours; you may want to bring reading material for the waiting time.  IF YOU THINK YOU MAY BE PREGNANT, OR ARE NURSING PLEASE INFORM THE TECHNOLOGIST.  In preparation for your appointment, medication and supplies will be purchased.  Appointment availability is limited, so if you need to cancel or reschedule, please call the Radiology Department Scheduler at [phone] 24 hours in advance to avoid a cancellation fee of $100.00  What to Expect When you Arrive:  Once you arrive and check in for your appointment, you will be taken to a preparation room within the Radiology Department.  A technologist or Nurse will obtain your medical history, verify that you are correctly prepped for the exam, and explain the procedure.  Afterwards, an IV will be started in your arm and electrodes will be placed on your skin for EKG monitoring during the stress portion of the  exam. Then you will be escorted to the PET/CT scanner.  There, staff will get you positioned on the scanner and obtain a blood pressure and EKG.  During the exam, you will continue to be connected to the EKG and blood pressure machines.  A small, safe amount of a radioactive tracer will be injected in your IV to obtain a series of pictures of your heart along with an injection of a stress agent.    After your Exam:  It is recommended that you eat a  meal and drink a caffeinated beverage to counter act any effects of the stress agent.  Drink plenty of fluids for the remainder of the day and urinate frequently for the first couple of hours after the exam.  Your doctor will inform you of your test results within 7-10 business days.  For more information and frequently asked questions, please visit our website: [email or web address]  For questions about your test or how to prepare for your test, please call: Cardiac Imaging Nurse Navigators Office: [phone]

## 2024-02-20 ENCOUNTER — Ambulatory Visit: Payer: MEDICAID | Admitting: Cardiology

## 2024-02-22 ENCOUNTER — Encounter: Payer: Self-pay | Admitting: Cardiology

## 2024-02-22 ENCOUNTER — Ambulatory Visit: Payer: MEDICAID | Admitting: Cardiology

## 2024-02-22 VITALS — BP 94/70 | HR 60 | Temp 98.1°F | Ht 67.0 in | Wt 260.4 lb

## 2024-02-22 DIAGNOSIS — R051 Acute cough: Secondary | ICD-10-CM | POA: Diagnosis not present

## 2024-02-22 DIAGNOSIS — J0110 Acute frontal sinusitis, unspecified: Secondary | ICD-10-CM | POA: Insufficient documentation

## 2024-02-22 DIAGNOSIS — Z131 Encounter for screening for diabetes mellitus: Secondary | ICD-10-CM

## 2024-02-22 DIAGNOSIS — Z0130 Encounter for examination of blood pressure without abnormal findings: Secondary | ICD-10-CM

## 2024-02-22 MED ORDER — CIPROFLOXACIN HCL 500 MG PO TABS: 500.0000 mg | ORAL_TABLET | Freq: Two times a day (BID) | ORAL | 0 refills | Status: AC

## 2024-02-22 MED ORDER — METHYLPREDNISOLONE 4 MG PO TBPK: ORAL_TABLET | ORAL | 0 refills | Status: DC

## 2024-02-22 MED ORDER — FLUTICASONE PROPIONATE 50 MCG/ACT NA SUSP: NASAL | 6 refills | Status: AC

## 2024-02-22 MED ORDER — BENZONATATE 100 MG PO CAPS: 200.0000 mg | ORAL_CAPSULE | Freq: Three times a day (TID) | ORAL | 0 refills | Status: DC | PRN

## 2024-02-22 NOTE — Progress Notes (Signed)
 Established Patient Office Visit  Subjective:  Patient ID: Elizabeth Spencer, female    DOB: 1985/06/17  Age: 38 y.o. MRN: [id number]  Chief Complaint  Patient presents with   Acute Visit    Ear pain, cough, congestion    Patient in office for an acute visit, complaining of ear pain, cough, and congestion. Patient reports symptoms started about 8 days ago. Thinks she may have had a fever, thermometer broke, unable to check at home. Reports episodes of diaphoresis. Had 4 antibiotic pills remaining from a previous prescription, took those. Has been using Zyrtec , Singulair , and Tylenol  with out relief.  Blood pressure well controlled.  Will send in Cipro , medrol  dose pack and tessalon  pearls. Use Flonase .   Sinus Problem This is a new problem. The current episode started 1 to 4 weeks ago. The problem is unchanged. There has been no fever. The pain is mild. Associated symptoms include congestion, coughing, diaphoresis, ear pain and sinus pressure. Pertinent negatives include no headaches or shortness of breath. Past treatments include acetaminophen  and antibiotics.    No other concerns at this time.   Past Medical History:  Diagnosis Date   Aneurysm 01/04/2014   Arthritis    Asthma    WELL CONTROLLED   Brain aneurysm 2007   NEUROLOGY NOTE DOES NOT MENTION ANEURYSM BUT PT STATES SHE DID NOT HAVE TO HAVE SURGERY   Complication of anesthesia    FOR 1 C-SECTION PT WAS ITCHING AND VERY RED ON HER FACE   Cough, persistent 01/27/2016   Dermatitis due to sunburn 11/08/2013   Family history of adverse reaction to anesthesia    brother, neice and nephew got red in face with hives   Gallstones    GERD (gastroesophageal reflux disease)    Gonorrhea 06/20/2020   Headache    CHRONIC HEADACHES   History of chronic cough    DRY   Loss of memory 05/30/2017   Perforation of left tympanic membrane 05/17/2013   Pneumonia    PTSD (post-traumatic stress disorder)    PTSD (post-traumatic stress  disorder)    Thyroid  condition    PT WAS JUST TOLD ON 08-25-17 THAT SHE HAS A THYROID  PROBLEM AND IS GOING TO F/U WITH ENDOCRINOLOGIST IN 2 WEEKS   Trichomonas vaginalis (TV) infection 06/20/2020    Past Surgical History:  Procedure Laterality Date   ABDOMINAL HYSTERECTOMY     CESAREAN SECTION     x3   CORONARY ARTERY BYPASS GRAFT N/A 09/02/2022   Procedure: CORONARY ARTERY BYPASS GRAFTING (CABG) X 1 USING LEFT INTERNAL MAMMARY ARTERY;  Surgeon: Lucas Dorise POUR, MD;  Location: [facility] OR;  Service: Open Heart Surgery;  Laterality: N/A;   CYSTOSCOPY N/A 09/29/2017   Procedure: CYSTOSCOPY;  Surgeon: Lake Read, MD;  Location: [facility] ORS;  Service: Gynecology;  Laterality: N/A;   CYSTOSCOPY N/A 11/05/2017   Procedure: CYSTOSCOPY;  Surgeon: Lake Read, MD;  Location: [facility] ORS;  Service: Gynecology;  Laterality: N/A;   HYSTEROSCOPY WITH D & C N/A 09/01/2017   Procedure: DILATATION AND CURETTAGE /HYSTEROSCOPY;  Surgeon: Lake Read, MD;  Location: [facility] ORS;  Service: Gynecology;  Laterality: N/A;   IMPLANT POCKET INCISION & DRAINAGE N/A 10/06/2023   Procedure: IMPLANT POCKET INCISION & DRAINAGE;  Surgeon: Kennyth Chew, MD;  Location: [facility] INVASIVE CV LAB;  Service: Cardiovascular;  Laterality: N/A;   LAPAROSCOPY N/A 09/01/2017   Procedure: LAPAROSCOPY OPERATIVE with biopsy;  Surgeon: Lake Read, MD;  Location: [facility] OR[facility];  Service: Gynecology;  Laterality: N/A;  LAPAROSCOPY N/A 11/05/2017   Procedure: LAPAROSCOPY DIAGNOSTIC;  Surgeon: Lake Read, MD;  Location: [facility] ORS;  Service: Gynecology;  Laterality: N/A;   LEFT HEART CATH AND CORONARY ANGIOGRAPHY N/A 09/01/2022   Procedure: LEFT HEART CATH AND CORONARY ANGIOGRAPHY;  Surgeon: Mady Bruckner, MD;  Location: [facility] INVASIVE CV LAB;  Service: Cardiovascular;  Laterality: N/A;   REPAIR VAGINAL CUFF N/A 11/05/2017   Procedure: REPAIR VAGINAL CUFF;  Surgeon: Lake Read, MD;  Location: [facility] ORS;  Service:  Gynecology;  Laterality: N/A;   SUBQ ICD IMPLANT N/A 08/08/2023   Procedure: SUBQ ICD IMPLANT;  Surgeon: Kennyth Chew, MD;  Location: [facility] INVASIVE CV LAB;  Service: Cardiovascular;  Laterality: N/A;   SUBQ ICD REVISION N/A 08/09/2023   Procedure: SUBQ ICD REVISION;  Surgeon: Kennyth Chew, MD;  Location: [facility]VAS[facility] CV LAB;  Service: Cardiovascular;  Laterality: N/A;   SUBQ ICD REVISION N/A 09/05/2023   Procedure: SUBQ ICD REVISION;  Surgeon: Kennyth Chew, MD;  Location: [facility] INVASIVE CV LAB;  Service: Cardiovascular;  Laterality: N/A;   TEE WITHOUT CARDIOVERSION N/A 09/02/2022   Procedure: TRANSESOPHAGEAL ECHOCARDIOGRAM;  Surgeon: Lucas Dorise POUR, MD;  Location: [facility] OR;  Service: Open Heart Surgery;  Laterality: N/A;   TOTAL LAPAROSCOPIC HYSTERECTOMY WITH SALPINGECTOMY Bilateral 09/29/2017   Procedure: TOTAL LAPAROSCOPIC HYSTERECTOMY WITH SALPINGECTOMY;  Surgeon: Lake Read, MD;  Location: [facility] ORS;  Service: Gynecology;  Laterality: Bilateral;    Social History   Socioeconomic History   Marital status: Single    Spouse name: Not on file   Number of children: Not on file   Years of education: Not on file   Highest education level: Not on file  Occupational History   Not on file  Tobacco Use   Smoking status: Every Day    Current packs/day: 0.50    Average packs/day: 0.5 packs/day for 18.0 years (9.0 ttl pk-yrs)    Types: Cigarettes    Passive exposure: Past   Smokeless tobacco: Never  Vaping Use   Vaping status: Never Used  Substance and Sexual Activity   Alcohol use: Not Currently   Drug use: Not Currently    Types: Marijuana, Cocaine   Sexual activity: Yes    Partners: Male    Birth control/protection: Surgical    Comment: Hysterectomy  Other Topics Concern   Not on file  Social History Narrative   Not on file   Social Drivers of Health   Financial Resource Strain: Not on file  Food Insecurity: No Food Insecurity (11/09/2023)   Hunger Vital Sign    Worried  About Running Out of Food in the Last Year: Never true    Ran Out of Food in the Last Year: Never true  Transportation Needs: No Transportation Needs (11/09/2023)   PRAPARE - Administrator, Civil Service (Medical): No    Lack of Transportation (Non-Medical): No  Physical Activity: Not on file  Stress: Not on file  Social Connections: Unknown (08/08/2023)   Social Connection and Isolation Panel    Frequency of Communication with Friends and Family: More than three times a week    Frequency of Social Gatherings with Friends and Family: More than three times a week    Attends Religious Services: Not on file    Active Member of Clubs or Organizations: Not on file    Attends Banker Meetings: Not on file    Marital Status: Living with partner  Intimate Partner Violence: Not At Risk (11/09/2023)   Humiliation, Afraid,  Rape, and Kick questionnaire    Fear of Current or Ex-Partner: No    Emotionally Abused: No    Physically Abused: No    Sexually Abused: No    Family History  Problem Relation Age of Onset   Hypertension Mother    Alcohol abuse Mother    Deep vein thrombosis Mother    Alcohol abuse Father    COPD Father    Emphysema Father    Heart disease Father     Allergies  Allergen Reactions   Azithromycin Swelling and Other (See Comments)   Clindamycin  Swelling   Codeine Hives and Other (See Comments)    Nausea/dizzy    Meloxicam  Other (See Comments)    Burns up from the inside    Neurontin [Gabapentin] Other (See Comments)    Makes her pass out and not remember what happened prior to taking med.   Penicillins Anaphylaxis    Patient allergic to all cillins Has patient had a PCN reaction causing immediate rash, facial/tongue/throat swelling, SOB or lightheadedness with hypotension: Yes Has patient had a PCN reaction causing severe rash involving mucus membranes or skin necrosis: Yes Has patient had a PCN reaction that required hospitalization:  No Has patient had a PCN reaction occurring within the last 10 years: Yes If all of the above answers are NO, then may proceed with Cephalosporin use.    Other Hives and Other (See Comments)    Berry flavored food and drinks, blue and red dye Can have Strawberry and Banana   Diphenhydramine  Hcl Rash and Other (See Comments)    Uses dye-free; allergic to dye in regular benadryl    Doxycycline Rash   Egg Protein-Containing Drug Products Rash    Outpatient Medications Prior to Visit  Medication Sig   acetaminophen  (TYLENOL ) 500 MG tablet Take 1,000 mg by mouth 2 (two) times daily.   ALPRAZolam  (ALPRAZOLAM  XR) 0.5 MG 24 hr tablet TAKE 1 TABLET BY MOUTH TWICE DAILY AS NEEDED FOR ANXIETY OR SLEEP   aspirin  81 MG chewable tablet Chew 1 tablet (81 mg total) by mouth daily.   atorvastatin  (LIPITOR) 40 MG tablet Take 1 tablet (40 mg total) by mouth daily.   Biotin w/ Vitamins C & E (HAIR SKIN & NAILS GUMMIES PO) Take 1 tablet by mouth daily. Biotin   cetirizine  (ZYRTEC ) 10 MG tablet Take 1 tablet (10 mg total) by mouth daily. (Patient taking differently: Take 10 mg by mouth at bedtime.)   diphenhydrAMINE  (BENADRYL ) 25 MG tablet Take 50 mg by mouth every 6 (six) hours as needed (allergic reaction). Dye and glutin free   empagliflozin  (JARDIANCE ) 10 MG TABS tablet Take 1 tablet (10 mg total) by mouth daily before breakfast.   EPIPEN  2-PAK 0.3 MG/0.3ML SOAJ injection INJECT INTO THIGH MUSCLE THROUGH CLOTHES AS NEEDED SEVERE ALLERGIC REACTION   furosemide  (LASIX ) 20 MG tablet Take 1 tablet (20 mg total) by mouth daily as needed.   linezolid  (ZYVOX ) 600 MG tablet Take 1 tablet (600 mg total) by mouth 2 (two) times daily.   methimazole  (TAPAZOLE ) 10 MG tablet Take 1 tablet (10 mg total) by mouth daily. (Patient taking differently: Take 15 mg by mouth daily. Total 15 mg)   metoprolol  succinate (TOPROL  XL) 25 MG 24 hr tablet Take 1 tablet (25 mg total) by mouth daily.   montelukast  (SINGULAIR ) 10 MG  tablet Take 1 tablet (10 mg total) by mouth at bedtime.   naloxone  (NARCAN ) nasal spray 4 mg/0.1 mL Place 1 spray into the  nose Once PRN for up to 1 dose (opioid overdose).   omeprazole  (PRILOSEC) 20 MG capsule Take 1 capsule (20 mg total) by mouth 2 (two) times daily.   ondansetron  (ZOFRAN -ODT) 4 MG disintegrating tablet Take 1 tablet (4 mg total) by mouth every 6 (six) hours as needed for nausea or vomiting.   PARoxetine (PAXIL) 10 MG tablet Take 10 mg by mouth daily.   spironolactone  (ALDACTONE ) 25 MG tablet Take 1 tablet (25 mg total) by mouth daily.   traMADol  (ULTRAM ) 50 MG tablet Take 1 tablet (50 mg total) by mouth every 12 (twelve) hours as needed.   Ubrogepant  100 MG TABS Take 1 tablet by mouth See admin instructions. Take one tablet by mouth at onset of migraine. May take an additional tablet two hours later if migraine persists.  No more than 2 tablets in 24 hours.   [DISCONTINUED] fluticasone  (FLONASE ) 50 MCG/ACT nasal spray 1 spray by Each Nare route daily.   clopidogrel  (PLAVIX ) 75 MG tablet Take 1 tablet (75 mg total) by mouth daily. (Patient not taking: Reported on 02/22/2024)   dapagliflozin  propanediol (FARXIGA ) 10 MG TABS tablet Take 1 tablet (10 mg total) by mouth daily before breakfast. (Patient not taking: Reported on 02/22/2024)   [DISCONTINUED] fluconazole  (DIFLUCAN ) 150 MG tablet Take 1 tablet (150 mg total) by mouth daily. (Patient not taking: Reported on 02/22/2024)   No facility-administered medications prior to visit.    Review of Systems  Constitutional:  Positive for diaphoresis.  HENT:  Positive for congestion, ear pain and sinus pressure.   Eyes: Negative.   Respiratory:  Positive for cough. Negative for shortness of breath.   Cardiovascular: Negative.  Negative for chest pain.  Gastrointestinal: Negative.  Negative for abdominal pain, constipation and diarrhea.  Genitourinary: Negative.   Musculoskeletal:  Negative for joint pain and myalgias.  Skin:  Negative.   Neurological: Negative.  Negative for dizziness and headaches.  Endo/Heme/Allergies: Negative.   All other systems reviewed and are negative.      Objective:   BP 94/70   Pulse 60   Temp 98.1 F (36.7 C) (Tympanic)   Ht 5' 7 (1.702 m)   Wt 260 lb 6.4 oz (118.1 kg)   LMP 08/22/2017 Comment: says she just spots.  SpO2 98%   BMI 40.78 kg/m   Vitals:   02/22/24 1058 02/22/24 1114  BP: 94/70   Pulse: (!) 113 60  Temp: 98.1 F (36.7 C)   Height: 5' 7 (1.702 m)   Weight: 260 lb 6.4 oz (118.1 kg)   SpO2: 98%   TempSrc: Tympanic   BMI (Calculated): 40.77     Physical Exam Vitals and nursing note reviewed.  Constitutional:      Appearance: Normal appearance. She is normal weight.  HENT:     Head: Normocephalic and atraumatic.     Right Ear: Hearing, tympanic membrane, ear canal and external ear normal.     Left Ear: Hearing, tympanic membrane, ear canal and external ear normal.     Nose: Nose normal.     Mouth/Throat:     Mouth: Mucous membranes are moist.  Eyes:     Extraocular Movements: Extraocular movements intact.     Conjunctiva/sclera: Conjunctivae normal.     Pupils: Pupils are equal, round, and reactive to light.  Cardiovascular:     Rate and Rhythm: Normal rate and regular rhythm.     Pulses: Normal pulses.     Heart sounds: Normal heart sounds.  Pulmonary:  Effort: Pulmonary effort is normal.     Breath sounds: Normal breath sounds. No stridor. No wheezing, rhonchi or rales.  Abdominal:     General: Abdomen is flat. Bowel sounds are normal.     Palpations: Abdomen is soft.  Musculoskeletal:        General: Normal range of motion.     Cervical back: Normal range of motion.  Skin:    General: Skin is warm and dry.  Neurological:     General: No focal deficit present.     Mental Status: She is alert and oriented to person, place, and time.  Psychiatric:        Mood and Affect: Mood normal.        Behavior: Behavior normal.         Thought Content: Thought content normal.        Judgment: Judgment normal.      No results found for any visits on 02/22/24.  Recent Results (from the past 2160 hours)  Lipid panel     Status: Abnormal   Collection Time: 12/06/23  2:43 PM  Result Value Ref Range   Cholesterol, Total 146 100 - 199 mg/dL   Triglycerides 816 (H) 0 - 149 mg/dL   HDL 48 >60 mg/dL   VLDL Cholesterol Cal 31 5 - 40 mg/dL   LDL Chol Calc (NIH) 67 0 - 99 mg/dL   Chol/HDL Ratio 3.0 0.0 - 4.4 ratio    Comment:                                   T. Chol/HDL Ratio                                             Men  Women                               1/2 Avg.Risk  3.4    3.3                                   Avg.Risk  5.0    4.4                                2X Avg.Risk  9.6    7.1                                3X Avg.Risk 23.4   11.0   VITAMIN D  25 Hydroxy (Vit-D Deficiency, Fractures)     Status: Abnormal   Collection Time: 12/06/23  2:43 PM  Result Value Ref Range   Vit D, 25-Hydroxy 18.1 (L) 30.0 - 100.0 ng/mL    Comment: Vitamin D  deficiency has been defined by the Institute of Medicine and an Endocrine Society practice guideline as a level of serum 25-OH vitamin D  less than 20 ng/mL (1,2). The Endocrine Society went on to further define vitamin D  insufficiency as a level between 21 and 29 ng/mL (2). 1. IOM (Institute of Medicine). 2010. Dietary reference    intakes for calcium  and  D. [place]: The    [employer]. 2. Holick MF, Binkley Learned, Bischoff-Ferrari HA, et al.    Evaluation, treatment, and prevention of vitamin D     deficiency: an Endocrine Society clinical practice    guideline. JCEM. 2011 Jul; 96(7):1911-30.   CMP14+EGFR     Status: Abnormal   Collection Time: 12/06/23  2:43 PM  Result Value Ref Range   Glucose 81 70 - 99 mg/dL   BUN 10 6 - 24 mg/dL   Creatinine, Ser 9.27 0.57 - 1.00 mg/dL   eGFR 891 >40 fO/fpw/8.26   BUN/Creatinine Ratio 14 9 - 23   Sodium 142 134 -  144 mmol/L   Potassium 4.4 3.5 - 5.2 mmol/L   Chloride 107 (H) 96 - 106 mmol/L   CO2 19 (L) 20 - 29 mmol/L   Calcium  9.2 8.7 - 10.2 mg/dL   Total Protein 6.8 6.0 - 8.5 g/dL   Albumin  4.2 3.9 - 4.9 g/dL   Globulin, Total 2.6 1.5 - 4.5 g/dL   Bilirubin Total 0.2 0.0 - 1.2 mg/dL   Alkaline Phosphatase 88 44 - 121 IU/L    Comment: **Effective December 19, 2023 Alkaline Phosphatase**   reference interval will be changing to:              Age                Female          Female           0 -  5 days         47 - 127       47 - 127           6 - 10 days         29 - 242       29 - 242          11 - 20 days        109 - 357      109 - 357          21 - 30 days         94 - 494       94 - 494           1 -  2 months      149 - 539      149 - 539           3 -  6 months      131 - 452      131 - 452           7 - 11 months      117 - 401      117 - 401   12 months -  6 years       158 - 369      158 - 369           7 - 12 years       150 - 409      150 - 409               13 years       156 - 435       78 - 227               14 years       114 - 375       73 -  161               15 years        88 - 279       56 - 134               16 years        74 - 207       51 - 121               17 years        63 - 161       47 - 113          18 - 20 years        51 - 125       42 - 106          21 - 50 years         47 - 123       41 - 116          51 - 80 years        49 - 135       51 - 125              >80 years        48 - 129       48 - 129    AST 16 0 - 40 IU/L   ALT 16 0 - 32 IU/L  TSH     Status: None   Collection Time: 12/06/23  2:43 PM  Result Value Ref Range   TSH 0.733 0.450 - 4.500 uIU/mL  Hemoglobin A1c     Status: None   Collection Time: 12/06/23  2:43 PM  Result Value Ref Range   Hgb A1c MFr Bld 5.3 4.8 - 5.6 %    Comment:          Prediabetes: 5.7 - 6.4          Diabetes: >6.4          Glycemic control for adults with diabetes: <7.0    Est. average glucose Bld gHb Est-mCnc  105 mg/dL  Vitamin B12     Status: None   Collection Time: 12/06/23  2:43 PM  Result Value Ref Range   Vitamin B-12 CANCELED pg/mL    Comment: [facility] was unable to collect sufficient specimen to perform the following test(s), and is providing the patient with re-collection instructions.  Result canceled by the ancillary.   CBC with Diff     Status: Abnormal   Collection Time: 12/06/23  2:43 PM  Result Value Ref Range   WBC 9.1 3.4 - 10.8 x10E3/uL   RBC 5.15 3.77 - 5.28 x10E6/uL   Hemoglobin 15.6 11.1 - 15.9 g/dL   Hematocrit 52.5 (H) 65.9 - 46.6 %   MCV 92 79 - 97 fL   MCH 30.3 26.6 - 33.0 pg   MCHC 32.9 31.5 - 35.7 g/dL   RDW 86.7 88.2 - 84.5 %   Platelets 190 150 - 450 x10E3/uL   Neutrophils 51 Not Estab. %   Lymphs 37 Not Estab. %   Monocytes 9 Not Estab. %   Eos 2 Not Estab. %   Basos 1 Not Estab. %   Neutrophils Absolute 4.5 1.4 - 7.0 x10E3/uL   Lymphocytes Absolute 3.4 (H) 0.7 - 3.1 x10E3/uL   Monocytes Absolute 0.9 0.1 - 0.9 x10E3/uL   EOS (ABSOLUTE) 0.2 0.0 - 0.4 x10E3/uL  Basophils Absolute 0.1 0.0 - 0.2 x10E3/uL   Immature Granulocytes 0 Not Estab. %   Immature Grans (Abs) 0.0 0.0 - 0.1 x10E3/uL  Cytology - Non PAP; Right ovarian cyst     Status: None   Collection Time: 12/07/23 12:00 AM  Result Value Ref Range   CYTOLOGY - NON GYN      CYTOLOGY - NON PAP [facility] [address] [place], [place] [address] Telephone [phone] or [phone] Fax [phone]  CYTOPATHOLOGY REPORT   Accession #: [id number] Patient Name: GWYNETH, FERNANDEZ Visit # : [id number]  MRN: [id number] Physician: Philip Cornet DOB/Age 01-28-1986 (Age: 54) Gender: F Collected Date: 12/07/2023 Received Date: 12/08/2023  FINAL DIAGNOSIS STATEMENT Of SPECIMEN ADEQUACY:  INTERPRETATION(S):      NEGATIVE FOR MALIGNANCY      SCATTERED MACROPHAGES CONSISTENT WITH BENIGN CYST CONTENTS      DATE SIGNED OUT: 12/09/2023 ELECTRONIC SIGNATURE :  [place] Md, Prentice , Sports Administrator, Electronic Signature   CASE COMMENTS   CLINICAL HISTORY  SOURCE OF SPECIMEN(S) Ovary Cyst Fluid  SPECIMEN COMMENTS: SPECIMEN CLINICAL INFORMATION: 1. Symptomatic right ovarian cyst    Gross Description Specimen: Received is/are 15cc of brown fluid in a sterile syringe and two slides      Prepared:      # Smear s: 2      # Concentration Technique Slides (i.e. ThinPrep): 1      # Cell Block: 1      # Diff-Quick Stain: 0        Report signed out from the following location(s) [place]. [facility] [address] [place], [place] [address] CLIA #: [id number]  [facility] [address] [place], [place] [address] CLIA #: [id number]   CBC     Status: None   Collection Time: 12/07/23 10:30 AM  Result Value Ref Range   WBC 7.7 4.0 - 10.5 K/uL   RBC 5.00 3.87 - 5.11 MIL/uL   Hemoglobin 14.9 12.0 - 15.0 g/dL   HCT 54.6 63.9 - 53.9 %   MCV 90.6 80.0 - 100.0 fL   MCH 29.8 26.0 - 34.0 pg   MCHC 32.9 30.0 - 36.0 g/dL   RDW 85.8 88.4 - 84.4 %   Platelets 224 150 - 400 K/uL   nRBC 0.0 0.0 - 0.2 %    Comment: Performed at [facility], [address] [place], [place] [address]  Protime-INR     Status: None   Collection Time: 12/07/23 10:30 AM  Result Value Ref Range   Prothrombin Time 13.6 11.4 - 15.2 seconds   INR 1.0 0.8 - 1.2    Comment: (NOTE) INR goal varies based on device and disease states. Performed at [facility], [address] [place], [place] [address]   [id number]     Status: Abnormal   Collection Time: 01/06/24  2:18 PM  Result Value Ref Range   TSH 0.914 0.450 - 4.500 uIU/mL   T3, Free 3.1 2.0 - 4.4 pg/mL   Free T4 0.80 (L) 0.82 - 1.77 ng/dL  Rheumatoid Arthritis Profile     Status: None   Collection Time: 01/06/24  2:18 PM  Result Value Ref Range   Rheumatoid fact SerPl-aCnc <10.0 <14.0 IU/mL   Cyclic Citrullin Peptide Ab 6 0 - 19 units    Comment:  Negative               <20                           Weak positive      20 - 39                           Moderate positive  40 - 59                           Strong positive        >59   POCT Urinalysis Dipstick     Status: Abnormal   Collection Time: 01/06/24  2:27 PM  Result Value Ref Range   Color, UA Yellow    Clarity, UA Clear    Glucose, UA Positive (A) Negative   Bilirubin, UA Negative    Ketones, UA Negative    Spec Grav, UA 1.020 1.010 - 1.025   Blood, UA Trace    pH, UA 5.5 5.0 - 8.0   Protein, UA Negative Negative   Urobilinogen, UA 0.2 0.2 or 1.0 E.U./dL   Nitrite, UA Negative    Leukocytes, UA Negative Negative   Appearance Clear    Odor Yes   POCT XPERT XPRESS SARS COVID-2/FLU/RSV     Status: None   Collection Time: 01/06/24  2:48 PM  Result Value Ref Range   SARS Coronavirus 2 Negative    FLU A Negative    FLU B Negative    RSV RNA, PCR Negative       Assessment & Plan:  Cipro  Medrol  dose pack Tessalon  pearls Flonase   Problem List Items Addressed This Visit       Respiratory   Acute non-recurrent frontal sinusitis - Primary   Relevant Medications   ciprofloxacin  (CIPRO ) 500 MG tablet   benzonatate  (TESSALON ) 100 MG capsule   methylPREDNISolone  (MEDROL  DOSEPAK) 4 MG TBPK tablet   fluticasone  (FLONASE ) 50 MCG/ACT nasal spray     Other   Acute cough    Return if symptoms worsen or fail to improve.   Total time spent: 25 minutes. This time includes review of previous notes and results and patient face to face interaction during today's visit.    Jeoffrey Pollen, NP  02/22/2024   This document may have been prepared by Dragon Voice Recognition software and as such may include unintentional dictation errors.

## 2024-02-27 ENCOUNTER — Other Ambulatory Visit: Payer: Self-pay | Admitting: Family

## 2024-03-09 ENCOUNTER — Ambulatory Visit: Payer: MEDICAID | Admitting: Family

## 2024-03-09 ENCOUNTER — Encounter: Payer: Self-pay | Admitting: Family

## 2024-03-09 MED ORDER — ALPRAZOLAM ER 0.5 MG PO TB24: 0.5000 mg | ORAL_TABLET | Freq: Every day | ORAL | 2 refills | Status: DC

## 2024-03-09 MED ORDER — LEVOFLOXACIN 500 MG PO TABS: 500.0000 mg | ORAL_TABLET | Freq: Every day | ORAL | 0 refills | Status: AC

## 2024-03-09 MED ORDER — SINUS RINSE REFILL NA PACK: 1.0000 | PACK | Freq: Two times a day (BID) | NASAL | 2 refills | Status: AC

## 2024-03-15 ENCOUNTER — Ambulatory Visit: Admission: RE | Admit: 2024-03-15 | Payer: MEDICAID

## 2024-03-20 ENCOUNTER — Telehealth: Payer: Self-pay | Admitting: Obstetrics and Gynecology

## 2024-03-20 NOTE — Telephone Encounter (Signed)
 Patient called to cancel her appointmen. She stated she didn't know she was scheduled to be seen tomorrow. Upon review, pt did not have her US  either. She stated no one ever called her to get it scheduled. She is requesting all her appointments be scheduled after Jan 15th.

## 2024-03-21 ENCOUNTER — Telehealth: Payer: Self-pay

## 2024-03-21 ENCOUNTER — Other Ambulatory Visit: Payer: Self-pay

## 2024-03-21 ENCOUNTER — Ambulatory Visit: Payer: MEDICAID

## 2024-03-21 NOTE — Telephone Encounter (Signed)
 Please forward to scheduling to assist in rescheduling her appointments. Thanks.

## 2024-03-21 NOTE — Telephone Encounter (Signed)
 Pt states her xanax  was sent in as once a day as needed not twice a day as needed. Pt is requesting this be changed.

## 2024-03-22 ENCOUNTER — Other Ambulatory Visit: Payer: Self-pay

## 2024-03-22 MED ORDER — ALPRAZOLAM ER 0.5 MG PO TB24: 0.5000 mg | ORAL_TABLET | Freq: Two times a day (BID) | ORAL | 2 refills | Status: DC

## 2024-03-22 NOTE — Telephone Encounter (Signed)
Pt informed

## 2024-03-28 ENCOUNTER — Emergency Department
Admission: EM | Admit: 2024-03-28 | Discharge: 2024-03-29 | Disposition: A | Payer: MEDICAID | Attending: Emergency Medicine | Admitting: Emergency Medicine

## 2024-03-28 ENCOUNTER — Other Ambulatory Visit: Payer: Self-pay

## 2024-03-28 ENCOUNTER — Encounter: Payer: Self-pay | Admitting: Emergency Medicine

## 2024-03-28 ENCOUNTER — Telehealth: Payer: Self-pay

## 2024-03-28 DIAGNOSIS — H9202 Otalgia, left ear: Secondary | ICD-10-CM | POA: Diagnosis present

## 2024-03-28 DIAGNOSIS — D72829 Elevated white blood cell count, unspecified: Secondary | ICD-10-CM | POA: Diagnosis not present

## 2024-03-28 DIAGNOSIS — H6692 Otitis media, unspecified, left ear: Secondary | ICD-10-CM | POA: Diagnosis not present

## 2024-03-28 DIAGNOSIS — R0789 Other chest pain: Secondary | ICD-10-CM

## 2024-03-28 LAB — CBC
HCT: 45.4 % (ref 36.0–46.0)
Hemoglobin: 15.1 g/dL — ABNORMAL HIGH (ref 12.0–15.0)
MCH: 30.5 pg (ref 26.0–34.0)
MCHC: 33.3 g/dL (ref 30.0–36.0)
MCV: 91.7 fL (ref 80.0–100.0)
Platelets: 230 K/uL (ref 150–400)
RBC: 4.95 MIL/uL (ref 3.87–5.11)
RDW: 13 % (ref 11.5–15.5)
WBC: 11.2 K/uL — ABNORMAL HIGH (ref 4.0–10.5)
nRBC: 0 % (ref 0.0–0.2)

## 2024-03-28 LAB — TROPONIN T, HIGH SENSITIVITY: Troponin T High Sensitivity: <15 ng/L (ref 0–19)

## 2024-03-28 LAB — BASIC METABOLIC PANEL WITH GFR
Anion gap: 16 — ABNORMAL HIGH (ref 5–15)
BUN: 8 mg/dL (ref 6–20)
CO2: 18 mmol/L — ABNORMAL LOW (ref 22–32)
Calcium: 8.9 mg/dL (ref 8.9–10.3)
Chloride: 106 mmol/L (ref 98–111)
Creatinine, Ser: 0.71 mg/dL (ref 0.44–1.00)
GFR, Estimated: >60 mL/min
Glucose, Bld: 98 mg/dL (ref 70–99)
Potassium: 3.7 mmol/L (ref 3.5–5.1)
Sodium: 139 mmol/L (ref 135–145)

## 2024-03-28 NOTE — Telephone Encounter (Signed)
 Ultrasound calling to ensure US  abdominal only will be needed for 03/30/24 with history of US  abdominal complete to include transvaginal US .     I called Lauren L, NP to confirm that the patient does need the US  abdominal complete that includes transvaginal US .  Kennyth will be changing order as requested.

## 2024-03-28 NOTE — ED Triage Notes (Signed)
 Pt arrives POV, ambulatory to triage, gait steady, no acute distress noted c/o of recent ear infection that was accompanied w/ jaw pain and finished antibiotics 2 days ago. Pt reports 2 days ago the pain in her jaw started to radiate down left arm. Pt states she is a HF pt and has had an MI in the past and experienced arm pain with it.

## 2024-03-29 MED ORDER — TRAMADOL HCL 50 MG PO TABS
100.0000 mg | ORAL_TABLET | Freq: Once | ORAL | Status: AC
  Administered 2024-03-29: 100 mg via ORAL
  Filled 2024-03-29: qty 2

## 2024-03-29 MED ORDER — CIPROFLOXACIN-DEXAMETHASONE 0.3-0.1 % OT SUSP
4.0000 [drp] | Freq: Once | OTIC | Status: AC
  Administered 2024-03-29: 4 [drp] via OTIC
  Filled 2024-03-29: qty 7.5

## 2024-03-29 MED ORDER — KETOROLAC TROMETHAMINE 30 MG/ML IJ SOLN
30.0000 mg | Freq: Once | INTRAMUSCULAR | Status: AC
  Administered 2024-03-29: 30 mg via INTRAMUSCULAR
  Filled 2024-03-29: qty 1

## 2024-03-29 NOTE — Discharge Instructions (Signed)
 Your cardiac evaluation tonight was reassuring and we agree that you may be having intermittent angina similar to what you have had in the past, but there is no evidence that you are having a heart attack or similarly dangerous condition tonight.  However, your left ear continues to look infected.  We think that changing to a topical antibiotic (Ciprodex ), which was provided to you tonight, may help.  Please administer 4 drops into your left ear twice daily for the next week.  Do not let anything else (cleaning instruments, water, etc.) into your left ear.  We recommend you call the office of Dr. Milissa and schedule a follow-up appointment at the ENT clinic with him or one of his colleagues.  You should also schedule follow-up appointments with your cardiologist and your primary care provider.    Return to the emergency department if you develop new or worsening symptoms that concern you.
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
## 2024-03-30 ENCOUNTER — Ambulatory Visit: Admission: RE | Admit: 2024-03-30 | Payer: MEDICAID | Source: Ambulatory Visit

## 2024-04-02 ENCOUNTER — Telehealth: Payer: Self-pay | Admitting: Family
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
## 2024-04-02 NOTE — Telephone Encounter (Signed)
 Called to confirm/remind patient of their appointment at the [facility] on 04/03/24.   Appointment:   [x] Confirmed  [] Left mess   [] No answer/No voice mail  [] VM Full/unable to leave message  [] Phone not in service  Patient reminded to bring all medications and/or complete list.  Confirmed patient has transportation. Gave directions, instructed to utilize valet parking.

## 2024-04-03 ENCOUNTER — Ambulatory Visit: Payer: MEDICAID | Admitting: Family

## 2024-04-03 ENCOUNTER — Telehealth (HOSPITAL_COMMUNITY): Payer: Self-pay

## 2024-04-03 ENCOUNTER — Encounter: Payer: Self-pay | Admitting: Family

## 2024-04-03 VITALS — BP 121/88 | HR 100 | Wt 250.2 lb

## 2024-04-03 DIAGNOSIS — Z7902 Long term (current) use of antithrombotics/antiplatelets: Secondary | ICD-10-CM | POA: Insufficient documentation

## 2024-04-03 DIAGNOSIS — E669 Obesity, unspecified: Secondary | ICD-10-CM | POA: Diagnosis not present

## 2024-04-03 DIAGNOSIS — Z7982 Long term (current) use of aspirin: Secondary | ICD-10-CM | POA: Diagnosis not present

## 2024-04-03 DIAGNOSIS — I110 Hypertensive heart disease with heart failure: Secondary | ICD-10-CM | POA: Diagnosis present

## 2024-04-03 DIAGNOSIS — I255 Ischemic cardiomyopathy: Secondary | ICD-10-CM | POA: Diagnosis present

## 2024-04-03 DIAGNOSIS — E119 Type 2 diabetes mellitus without complications: Secondary | ICD-10-CM | POA: Insufficient documentation

## 2024-04-03 DIAGNOSIS — F419 Anxiety disorder, unspecified: Secondary | ICD-10-CM | POA: Diagnosis not present

## 2024-04-03 DIAGNOSIS — Z720 Tobacco use: Secondary | ICD-10-CM | POA: Diagnosis not present

## 2024-04-03 DIAGNOSIS — F17210 Nicotine dependence, cigarettes, uncomplicated: Secondary | ICD-10-CM | POA: Diagnosis not present

## 2024-04-03 DIAGNOSIS — R300 Dysuria: Secondary | ICD-10-CM | POA: Insufficient documentation

## 2024-04-03 DIAGNOSIS — E785 Hyperlipidemia, unspecified: Secondary | ICD-10-CM | POA: Diagnosis not present

## 2024-04-03 DIAGNOSIS — Z951 Presence of aortocoronary bypass graft: Secondary | ICD-10-CM | POA: Diagnosis not present

## 2024-04-03 DIAGNOSIS — L089 Local infection of the skin and subcutaneous tissue, unspecified: Secondary | ICD-10-CM | POA: Insufficient documentation

## 2024-04-03 DIAGNOSIS — T827XXD Infection and inflammatory reaction due to other cardiac and vascular devices, implants and grafts, subsequent encounter: Secondary | ICD-10-CM

## 2024-04-03 DIAGNOSIS — I5022 Chronic systolic (congestive) heart failure: Secondary | ICD-10-CM | POA: Insufficient documentation

## 2024-04-03 DIAGNOSIS — J45909 Unspecified asthma, uncomplicated: Secondary | ICD-10-CM | POA: Diagnosis not present

## 2024-04-03 DIAGNOSIS — E039 Hypothyroidism, unspecified: Secondary | ICD-10-CM | POA: Insufficient documentation

## 2024-04-03 DIAGNOSIS — H9202 Otalgia, left ear: Secondary | ICD-10-CM | POA: Insufficient documentation

## 2024-04-03 DIAGNOSIS — F40240 Claustrophobia: Secondary | ICD-10-CM | POA: Diagnosis not present

## 2024-04-03 DIAGNOSIS — Z6839 Body mass index (BMI) 39.0-39.9, adult: Secondary | ICD-10-CM | POA: Diagnosis not present

## 2024-04-03 DIAGNOSIS — Z7985 Long-term (current) use of injectable non-insulin antidiabetic drugs: Secondary | ICD-10-CM | POA: Diagnosis not present

## 2024-04-03 DIAGNOSIS — Z79899 Other long term (current) drug therapy: Secondary | ICD-10-CM | POA: Insufficient documentation

## 2024-04-03 DIAGNOSIS — R7303 Prediabetes: Secondary | ICD-10-CM | POA: Diagnosis not present

## 2024-04-03 DIAGNOSIS — I2510 Atherosclerotic heart disease of native coronary artery without angina pectoris: Secondary | ICD-10-CM | POA: Insufficient documentation

## 2024-04-03 DIAGNOSIS — E66812 Obesity, class 2: Secondary | ICD-10-CM | POA: Diagnosis not present

## 2024-04-03 DIAGNOSIS — Z7984 Long term (current) use of oral hypoglycemic drugs: Secondary | ICD-10-CM | POA: Diagnosis not present

## 2024-04-03 MED ORDER — EMPAGLIFLOZIN 10 MG PO TABS: 10.0000 mg | ORAL_TABLET | Freq: Every day | ORAL | 1 refills | Status: DC

## 2024-04-03 MED ORDER — FUROSEMIDE 20 MG PO TABS: 20.0000 mg | ORAL_TABLET | Freq: Every day | ORAL | 2 refills | Status: DC | PRN

## 2024-04-03 MED ORDER — SPIRONOLACTONE 25 MG PO TABS: 25.0000 mg | ORAL_TABLET | Freq: Every day | ORAL | 1 refills | Status: DC

## 2024-04-03 MED ORDER — ATORVASTATIN CALCIUM 40 MG PO TABS: 40.0000 mg | ORAL_TABLET | Freq: Every day | ORAL | 1 refills | Status: DC

## 2024-04-03 MED ORDER — METOPROLOL SUCCINATE ER 25 MG PO TB24: 25.0000 mg | ORAL_TABLET | Freq: Every day | ORAL | 1 refills | Status: DC

## 2024-04-03 NOTE — Patient Instructions (Addendum)
 It was good to see you today!  Medication Changes:  No medication changes today! Your heart medications have been refilled to your preferred pharmacy   Follow-Up in: Please follow up with the [facility] in 6 months with Ellouise Class, FNP.   Thank you for choosing [facility].    At the [facility], you and your health needs are our priority. We have a designated team specialized in the treatment of Heart Failure. This Care Team includes your primary Heart Failure Specialized Cardiologist (physician), Advanced Practice Providers (APPs- Physician Assistants and Nurse Practitioners), and Pharmacist who all work together to provide you with the care you need, when you need it.   You may see any of the following providers on your designated Care Team at your next follow up:  Dr. Toribio Fuel Dr. Ezra Shuck Dr. Ria Commander Dr. Morene Brownie Ellouise Class, FNP Jaun Bash, RPH-CPP  Please be sure to bring in all your medications bottles to every appointment.   Need to Contact Us :  If you have any questions or concerns before your next appointment please send us  a message through [place] or call our office at [phone].    TO LEAVE A MESSAGE FOR THE NURSE SELECT OPTION 2, PLEASE LEAVE A MESSAGE INCLUDING: YOUR NAME DATE OF BIRTH CALL BACK NUMBER REASON FOR CALL**this is important as we prioritize the call backs  YOU WILL RECEIVE A CALL BACK THE SAME DAY AS LONG AS YOU CALL BEFORE 4:00 PM

## 2024-04-03 NOTE — Telephone Encounter (Signed)
 No pre cert required for sleep study [address]

## 2024-04-04 ENCOUNTER — Other Ambulatory Visit: Payer: Self-pay

## 2024-04-04 MED ORDER — ASPIRIN 81 MG PO CHEW: 81.0000 mg | CHEWABLE_TABLET | Freq: Every day | ORAL | 1 refills | Status: DC

## 2024-04-04 MED ORDER — ATORVASTATIN CALCIUM 40 MG PO TABS: 40.0000 mg | ORAL_TABLET | Freq: Every day | ORAL | 1 refills | Status: DC

## 2024-04-04 MED ORDER — EMPAGLIFLOZIN 10 MG PO TABS: 10.0000 mg | ORAL_TABLET | Freq: Every day | ORAL | 1 refills | Status: AC

## 2024-04-04 MED ORDER — FUROSEMIDE 20 MG PO TABS: 20.0000 mg | ORAL_TABLET | Freq: Every day | ORAL | 1 refills | Status: AC | PRN

## 2024-04-04 MED ORDER — SPIRONOLACTONE 25 MG PO TABS: 25.0000 mg | ORAL_TABLET | Freq: Every day | ORAL | 1 refills | Status: AC

## 2024-04-04 MED ORDER — METOPROLOL SUCCINATE ER 25 MG PO TB24: 25.0000 mg | ORAL_TABLET | Freq: Every day | ORAL | 1 refills | Status: AC

## 2024-04-04 NOTE — Telephone Encounter (Signed)
 All medications to be sent to [facility] on [address] per pt request.

## 2024-04-10 ENCOUNTER — Ambulatory Visit: Admission: RE | Admit: 2024-04-10 | Payer: MEDICAID

## 2024-04-12 NOTE — Progress Notes (Addendum)
 HPI:  Elizabeth Spencer returns for follow-up evaluation of her thyroid .  She is a 39 y.o. female who I saw for the first time in 10/19 for hyperthyroidism.  On exam, I felt a goiter so I referred her for an ultrasound.  Her ultrasound showed multiple nodules.  I therefore referred her for a thyroid  uptake and scan to see if any of the nodules were hyperfunctioning and if any of the nodules needed to be biopsied.  Her dominant right sided nodule was cold in the lower aspect so I had her come back for a biopsy.  Turns out it was all cyst fluid which is why it was cold on scan.  She had several hyperfunctioning nodules and I wanted to treat her with radioiodine but she did not want that therapy. I last saw her in 8/25. At that time, I started her on Wegovy .  She only took two shots as her sig other giving them to her and she is now going through a break up with him (after a 4 year relationship).  She doesn't think she can give herself the injections.  She also has a uri today..   She is currently on MMZ 15 mg daily (10 mg pills).  She complains of tachycardia, and weight gain.  She is also concerned about her heart.  Her weight is down 4 pounds since last visit.  Review of her diet shows she avoids concentrated carbs.   She cannot tell that her thyroid  is there.  She has no anterior neck pain/swelling.  She is concerned about her thyroid .  ROS:  No chest pain.  No shortness of breath.   Medical History: Past Medical History:  Diagnosis Date   Arthritis    Asthma without status asthmaticus (HHS-HCC)    Fetal alcohol syndrome (HHS-HCC)    Migraine     Surgical History: Past Surgical History:  Procedure Laterality Date   ABDOMINAL HYSTERECTOMY W/ PARTIAL VAGINACTOMY  09/2017   CESAREAN SECTION      Social History:  reports that she has been smoking cigarettes. She has a 16 pack-year smoking history. She has never used smokeless tobacco. She reports that she does not currently use alcohol. She  reports current drug use. Drug: Other-see comments.  Family History: family history includes Bipolar disorder in her brother; COPD in her father; Clotting disorder in her brother and mother; Heart murmur in her father; High blood pressure (Hypertension) in her father and mother; Seizures in her brother; Thyroid  disease in her mother.  Medications: Current Outpatient Medications  Medication Sig Dispense Refill   ALPRAZolam  (XANAX ) 0.5 MG tablet Take 0.5 mg by mouth 2 (two) times daily as needed for Sleep     aspirin  81 MG chewable tablet Take 81 mg by mouth once daily     atorvastatin  (LIPITOR) 40 MG tablet Take 40 mg by mouth once daily     cetirizine  (ZYRTEC ) 10 MG tablet Take 10 mg by mouth once daily     ciprofloxacin  HCl (CIPRO ) 500 MG tablet Take 500 mg by mouth 2 (two) times daily For 7 days     EPINEPHrine  (EPIPEN ) 0.3 mg/0.3 mL pen injector Inject into thigh muscle through clothes PRN severe allergic reaction     FUROsemide  (LASIX ) 20 MG tablet Take 20 mg by mouth once daily     hydrOXYzine  (ATARAX ) 25 MG tablet Take 25 mg by mouth 3 (three) times daily as needed for Itching     loratadine  (CLARITIN  ORAL) Take by mouth  losartan  (COZAAR ) 25 MG tablet Take 12.5 mg by mouth once daily     methIMAzole  (TAPAZOLE ) 10 MG tablet Take 2 tablets (20 mg total) by mouth once daily 135 tablet 4   metoprolol  SUCCinate (TOPROL -XL) 25 MG XL tablet Take 25 mg by mouth once daily     metroNIDAZOLE  (FLAGYL ) 500 MG tablet Take 500 mg by mouth every 8 (eight) hours     omeprazole  (PRILOSEC OTC) 20 MG EC tablet Take 20 mg by mouth 2 (two) times daily     omeprazole  20 mg Take 1 tablet by mouth once daily  2   sacubitriL-valsartan (ENTRESTO ) 24-26 mg tablet Take 1 tablet by mouth 2 (two) times daily     semaglutide  (WEGOVY ) 1.7 mg/0.75 mL pen injector Inject 0.75 mLs (1.7 mg total) subcutaneously once a week 3 mL 3   spironolactone  (ALDACTONE ) 25 MG tablet Take 25 mg by mouth once  daily     sucralfate  (CARAFATE ) 1 gram tablet Take 1 g by mouth 4 (four) times daily before meals and nightly     ubrogepant  (UBRELVY ) 100 mg Tab Take by mouth     apixaban  (ELIQUIS ) 5 mg tablet Take 5 mg by mouth every 12 (twelve) hours (Patient not taking: Reported on 04/12/2024)     clopidogreL  (PLAVIX ) 75 mg tablet Take 75 mg by mouth once daily (Patient not taking: Reported on 04/12/2024)     semaglutide  (WEGOVY ) 0.25 mg/0.5 mL pen injector Inject 0.5 mLs (0.25 mg total) subcutaneously once a week for 28 days 2 mL 0   semaglutide  (WEGOVY ) 0.5 mg/0.5 mL pen injector Inject 0.5 mLs (0.5 mg total) subcutaneously once a week for 28 days 2 mL 0   semaglutide  (WEGOVY ) 1 mg/0.5 mL pen injector Inject 0.5 mLs (1 mg total) subcutaneously once a week for 28 days 2 mL 0   No current facility-administered medications for this visit.    Allergies: Allergies  Allergen Reactions   Gabapentin Other (See Comments)    Does not remember anything   Meloxicam  Other (See Comments)    Burns up from the inside   Amoxicillin Unknown    Other reaction(s): HIVES   Azithromycin Unknown   Diphenhydramine  Hcl Rash   Other Other (See Comments)    PT states she is allergic to any of the Cillins but also states that every time she is prescribed a new antibiotic she adds it to the list.  PT has throat swelling and trouble breathing currently with all Cillins.    Oxycodone  Unknown   Penicillin Unknown   Doxycycline Rash    Physical Exam: Vitals:   04/12/24 1430  BP: 94/62  Pulse: 103  SpO2: 99%  Weight: (!) 113.9 kg (251 lb)  Height: 162.6 cm (5' 4)    Body mass index is 43.08 kg/m. GENERAL: Pleasant, well-appearing female in no distress. NECK: Supple. Trachea midline. Thyroid  large right-sided palpable nodule. No bruits.   Physical exam otherwise deferred due to coronavirus precautions.  Labs: 02/17/2011: TSH = 0.03, free T4 = 1.19, free T3 = 5.53 02/05/2012: TSH <0.01 08/22/2017:  K/Cr/Ca = 3.9/0.53/8.8.  CBC normal. 08/23/2017: TSH <0.01, free T4 = 2.42 (0.7-1.41), free T3 = 9.61 (2.32-6.09).  Thyrotropin receptor antibody <1. 01/11/2018: TSH<0.01, FT4=1.31, FT3=7.9.   02/02/2018: Thyroid  ultrasound shows multiple nodules including a dominant, complex right-sided nodule measuring 6.3 x 2.9 x 3.6 cm and a left upper 2.7 x 1.8 x 1.8 cm nodule and a left mid 1.4 x 1.1 x 1.2 cm nodule.  08/08/18:  TSH<0.01, FT4=1.74, FT3=7.2. 08/18/18:  NM uptake scan with 4 hour uptake=20.8%, 24 hour uptake=25.4%.  Imaging with multiple nodules.  No cold nodules.  Looks like left upper and probably lower hot areas.  Right upper hot.  Cold right lower. 09/05/2018: Biopsy of lower part of dominant right nodule showed only cyst fluid. 03/06/2019:  TSH<0.01.  FT4=1.62.  FT3=8.4.  10/16/2019: TSH <0.01.  Free T4 = 1.09.  Free T3 =5.2.   10/14/2020:  K/Cr/Ca= 4.3/0.68/9.7.  LFTs nl.  TSH<0.05.  FT4=1.74 (0.82-1.77).  FT3=5.6 (2-4.4).  04/22/2023: TSH<0.005.  FT4=1.8.  FT3=7.1.   D=11.4 06/03/2023:  K/Cr/Ca=4.2/0.66/9.1.   LFTs nl.  TSH<0.005.  FT4=1.47.  FT3=5.4.  09/02/2023:  TSH=0.022.  FT4=1.12. 12/06/2023:  K/Cr/Ca= 4.4/0.72/9.2.  TSH=0.733.  D=18.1. 01/06/2024:  UDY=9.085.  FT4=0.8 (0.82-1.77).  FT3=3.1.  Assessment/Plan: 1.  Hyperthyroidism.  She has had hyperthyroidism dating back at least to 2012.  It worsened as her T4 and T3 became overtly elevated based on labs in 5/19 and again in 10/19.  Graves' is unlikely as her thyrotropin receptor antibodies were negative in the past.  Her nodules suggested TMNG, so I did a scan that confirmed several hot nodules.  I previously recommended radioiodine treatment but she did not want it, so I started her on MMZ.  Her TSH in 2/25 was suppressed, and her FT3 and FT4 were elevated on 10 mg MMZ so I increased her to 15mg  daily. Her TSH and FT3 were nl in 10/25, though her FT4 was suppressed on that dose. I encouraged her to continue.  She has a visit with her pcp next  week and will have the levels done then. I will review those results and make adjustments as necessary.   2.  Multinodular goiter.  She has multiple nodules including a dominant 6.3 cm right-sided nodule as well as a left upper 2.7 and left mid 1.4 cm nodule.  Scan suggested lower half of right sided nodule was cold so FNA was attempted and resulted with cystic fluid in the lower part (which is why it was cold).  Will plan to order a repeat u/s next visit.  3.  Heart disease. She has CAD s/p CABG and chronic HFrEF.  She had an AICD placed but then had to have it removed due to infection.   She is being followed by cardiology and was last seen in 12/25.  Normalizing her thyroid  is important for her cardiovascular disease.   4.  Recurrent no-shows/cancellations.  She has no-showed or same-day cancelled multiple times in the past years.  She seems to be more concerned about her thyroid  care now since it may impact her heart health.  Hopefully she will keep future appointments.   5.  Obesity.  She was previously under the impression that her thyroid  caused it.  Most of her family is obese so I told her it was genetics as well as lifestyle that was causing it.  Her weight today is 251 pounds with a BMI of 43.08.  Her weight is down 4 pounds since last visit.  Last visit, I started her on Wegovy .  Her x boyfriend gave her the first two injections but she hasn't taken it since as she says she can't give it to herself.  I suggested she come into the clinic to have a nurse visit and bring the injection so my nurse can coach her through it.  I told her we could do this a few times and once she is comfortable,  she can take over the injections.   I encouraged her to keep working on diet, exercise and weight loss.    6.  URI.  Will do resp swab today.  6.  She will return to clinic in 3-4 months but she may come back in the next few weeks for a nurse visit to learn the wegovy  injections.   Addendum:  Resp tesing  neg for Flu, Covid, RSV  04/16/24:  TSH=0.119.  D=11.8.  Will leave MMZ the same at 15 and encouraged her to take it every day.  TSH was normal last time on same dose.   This note is partially prepared by Earla Daria Messier, Scribe, in the presence of and acting as the scribe of Dr. Debby Breaker , MD.    [facility], MD

## 2024-04-13 ENCOUNTER — Telehealth: Payer: Self-pay | Admitting: Internal Medicine

## 2024-04-13 MED ORDER — LORAZEPAM 0.5 MG PO TABS: 0.5000 mg | ORAL_TABLET | Freq: Once | ORAL | 0 refills | Status: AC

## 2024-04-13 NOTE — Telephone Encounter (Signed)
 Previously discussed with Dr. Kennyth. Prescription has been called in.

## 2024-04-13 NOTE — Telephone Encounter (Signed)
 Pt c/o medication issue:  1. Name of Medication:   Pre-test medication  2. How are you currently taking this medication (dosage and times per day)?   3. Are you having a reaction (difficulty breathing--STAT)?   4. What is your medication issue?   Patient called to follow-up on pre-test medication to be called in to her pharmacy - [id number] [place], [place] - [address]  Patient noted test scheduled on 1/15.

## 2024-04-16 ENCOUNTER — Encounter: Payer: Self-pay | Admitting: Family

## 2024-04-16 ENCOUNTER — Ambulatory Visit: Payer: MEDICAID | Admitting: Family

## 2024-04-16 ENCOUNTER — Other Ambulatory Visit: Payer: MEDICAID

## 2024-04-16 VITALS — BP 100/68 | HR 45 | Ht 67.0 in | Wt 247.6 lb

## 2024-04-16 DIAGNOSIS — E538 Deficiency of other specified B group vitamins: Secondary | ICD-10-CM

## 2024-04-16 DIAGNOSIS — E0590 Thyrotoxicosis, unspecified without thyrotoxic crisis or storm: Secondary | ICD-10-CM

## 2024-04-16 DIAGNOSIS — E559 Vitamin D deficiency, unspecified: Secondary | ICD-10-CM

## 2024-04-16 DIAGNOSIS — H6062 Unspecified chronic otitis externa, left ear: Secondary | ICD-10-CM

## 2024-04-16 DIAGNOSIS — M79605 Pain in left leg: Secondary | ICD-10-CM

## 2024-04-16 DIAGNOSIS — E785 Hyperlipidemia, unspecified: Secondary | ICD-10-CM

## 2024-04-16 DIAGNOSIS — E782 Mixed hyperlipidemia: Secondary | ICD-10-CM

## 2024-04-16 DIAGNOSIS — R7303 Prediabetes: Secondary | ICD-10-CM

## 2024-04-16 MED ORDER — ALPRAZOLAM ER 0.5 MG PO TB24: 0.5000 mg | ORAL_TABLET | Freq: Two times a day (BID) | ORAL | 2 refills | Status: AC

## 2024-04-16 MED ORDER — CETIRIZINE HCL 10 MG PO TABS: 10.0000 mg | ORAL_TABLET | Freq: Every day | ORAL | 1 refills | Status: AC

## 2024-04-16 MED ORDER — ATORVASTATIN CALCIUM 40 MG PO TABS: 40.0000 mg | ORAL_TABLET | Freq: Every day | ORAL | 1 refills | Status: AC

## 2024-04-16 MED ORDER — ALBUTEROL SULFATE HFA 108 (90 BASE) MCG/ACT IN AERS: 2.0000 | INHALATION_SPRAY | Freq: Four times a day (QID) | RESPIRATORY_TRACT | 2 refills | Status: AC | PRN

## 2024-04-16 MED ORDER — ASPIRIN 81 MG PO CHEW: 81.0000 mg | CHEWABLE_TABLET | Freq: Every day | ORAL | 1 refills | Status: AC

## 2024-04-16 MED ORDER — MONTELUKAST SODIUM 10 MG PO TABS: 10.0000 mg | ORAL_TABLET | Freq: Every day | ORAL | 1 refills | Status: AC

## 2024-04-16 MED ORDER — NAPROXEN 375 MG PO TABS: 375.0000 mg | ORAL_TABLET | Freq: Two times a day (BID) | ORAL | 1 refills | Status: AC

## 2024-04-17 ENCOUNTER — Other Ambulatory Visit: Payer: Self-pay

## 2024-04-17 ENCOUNTER — Other Ambulatory Visit: Payer: Self-pay | Admitting: Nurse Practitioner

## 2024-04-17 ENCOUNTER — Encounter (HOSPITAL_COMMUNITY): Payer: Self-pay

## 2024-04-17 ENCOUNTER — Ambulatory Visit
Admission: RE | Admit: 2024-04-17 | Discharge: 2024-04-17 | Disposition: A | Discharge status: Home / Self Care | Payer: MEDICAID | Source: Ambulatory Visit | Attending: Nurse Practitioner | Admitting: Nurse Practitioner

## 2024-04-17 DIAGNOSIS — I255 Ischemic cardiomyopathy: Secondary | ICD-10-CM

## 2024-04-17 DIAGNOSIS — N83209 Unspecified ovarian cyst, unspecified side: Secondary | ICD-10-CM | POA: Diagnosis present

## 2024-04-17 LAB — CMP14+EGFR
ALT: 16 IU/L (ref 0–32)
AST: 19 IU/L (ref 0–40)
Albumin: 4.4 g/dL (ref 3.9–4.9)
Alkaline Phosphatase: 80 IU/L (ref 41–116)
BUN/Creatinine Ratio: 7 — ABNORMAL LOW (ref 9–23)
BUN: 6 mg/dL (ref 6–24)
Bilirubin Total: 0.3 mg/dL (ref 0.0–1.2)
Calcium: 9.3 mg/dL (ref 8.7–10.2)
Chloride: 106 mmol/L (ref 96–106)
Creatinine, Ser: 0.84 mg/dL (ref 0.57–1.00)
Globulin, Total: 2.2 g/dL (ref 1.5–4.5)
Glucose: 112 mg/dL — ABNORMAL HIGH (ref 70–99)
Potassium: 3.9 mmol/L (ref 3.5–5.2)
Sodium: 139 mmol/L (ref 134–144)
Total Protein: 6.6 g/dL (ref 6.0–8.5)
eGFR: 90 mL/min/1.73

## 2024-04-17 LAB — HEMOGLOBIN A1C
Est. average glucose Bld gHb Est-mCnc: 105 mg/dL
Hgb A1c MFr Bld: 5.3 % (ref 4.8–5.6)

## 2024-04-17 LAB — LIPID PANEL
Chol/HDL Ratio: 3.6 ratio (ref 0.0–4.4)
Cholesterol, Total: 145 mg/dL (ref 100–199)
HDL: 40 mg/dL
LDL Chol Calc (NIH): 78 mg/dL (ref 0–99)
Triglycerides: 156 mg/dL — ABNORMAL HIGH (ref 0–149)
VLDL Cholesterol Cal: 27 mg/dL (ref 5–40)

## 2024-04-17 LAB — VITAMIN B12: Vitamin B-12: 391 pg/mL (ref 232–1245)

## 2024-04-17 LAB — CBC WITH DIFFERENTIAL/PLATELET
Basophils Absolute: 0.1 x10E3/uL (ref 0.0–0.2)
Basos: 1 %
EOS (ABSOLUTE): 0.3 x10E3/uL (ref 0.0–0.4)
Eos: 2 %
Hematocrit: 49.2 % — ABNORMAL HIGH (ref 34.0–46.6)
Hemoglobin: 16.3 g/dL — ABNORMAL HIGH (ref 11.1–15.9)
Immature Grans (Abs): 0 x10E3/uL (ref 0.0–0.1)
Immature Granulocytes: 0 %
Lymphocytes Absolute: 3.9 x10E3/uL — ABNORMAL HIGH (ref 0.7–3.1)
Lymphs: 36 %
MCH: 30.9 pg (ref 26.6–33.0)
MCHC: 33.1 g/dL (ref 31.5–35.7)
MCV: 93 fL (ref 79–97)
Monocytes Absolute: 0.9 x10E3/uL (ref 0.1–0.9)
Monocytes: 9 %
Neutrophils Absolute: 5.6 x10E3/uL (ref 1.4–7.0)
Neutrophils: 52 %
Platelets: 231 x10E3/uL (ref 150–450)
RBC: 5.27 x10E6/uL (ref 3.77–5.28)
RDW: 12.4 % (ref 11.7–15.4)
WBC: 10.8 x10E3/uL (ref 3.4–10.8)

## 2024-04-17 LAB — VITAMIN D 25 HYDROXY (VIT D DEFICIENCY, FRACTURES): Vit D, 25-Hydroxy: 11.8 ng/mL — ABNORMAL LOW (ref 30.0–100.0)

## 2024-04-17 LAB — TSH: TSH: 0.119 u[IU]/mL — ABNORMAL LOW (ref 0.450–4.500)

## 2024-04-19 ENCOUNTER — Ambulatory Visit: Admission: RE | Admit: 2024-04-19 | Payer: MEDICAID | Source: Ambulatory Visit

## 2024-04-20 ENCOUNTER — Emergency Department: Payer: MEDICAID

## 2024-04-20 ENCOUNTER — Other Ambulatory Visit: Payer: Self-pay

## 2024-04-20 ENCOUNTER — Encounter: Payer: Self-pay | Admitting: Emergency Medicine

## 2024-04-20 ENCOUNTER — Emergency Department
Admission: EM | Admit: 2024-04-20 | Discharge: 2024-04-20 | Disposition: A | Discharge status: Home / Self Care | Payer: MEDICAID | Attending: Emergency Medicine | Admitting: Emergency Medicine

## 2024-04-20 DIAGNOSIS — J45909 Unspecified asthma, uncomplicated: Secondary | ICD-10-CM | POA: Insufficient documentation

## 2024-04-20 DIAGNOSIS — R109 Unspecified abdominal pain: Secondary | ICD-10-CM | POA: Diagnosis present

## 2024-04-20 DIAGNOSIS — N83201 Unspecified ovarian cyst, right side: Secondary | ICD-10-CM

## 2024-04-20 DIAGNOSIS — Z951 Presence of aortocoronary bypass graft: Secondary | ICD-10-CM | POA: Insufficient documentation

## 2024-04-20 DIAGNOSIS — I5022 Chronic systolic (congestive) heart failure: Secondary | ICD-10-CM | POA: Insufficient documentation

## 2024-04-20 DIAGNOSIS — I2510 Atherosclerotic heart disease of native coronary artery without angina pectoris: Secondary | ICD-10-CM | POA: Diagnosis not present

## 2024-04-20 LAB — COMPREHENSIVE METABOLIC PANEL WITH GFR
ALT: 14 U/L (ref 0–44)
AST: 18 U/L (ref 15–41)
Albumin: 4.5 g/dL (ref 3.5–5.0)
Alkaline Phosphatase: 85 U/L (ref 38–126)
Anion gap: 13 (ref 5–15)
BUN: 7 mg/dL (ref 6–20)
CO2: 22 mmol/L (ref 22–32)
Calcium: 9.7 mg/dL (ref 8.9–10.3)
Chloride: 101 mmol/L (ref 98–111)
Creatinine, Ser: 0.8 mg/dL (ref 0.44–1.00)
GFR, Estimated: >60 mL/min
Glucose, Bld: 111 mg/dL — ABNORMAL HIGH (ref 70–99)
Potassium: 4 mmol/L (ref 3.5–5.1)
Sodium: 136 mmol/L (ref 135–145)
Total Bilirubin: 0.9 mg/dL (ref 0.0–1.2)
Total Protein: 7.6 g/dL (ref 6.5–8.1)

## 2024-04-20 LAB — CBC WITH DIFFERENTIAL/PLATELET
Abs Immature Granulocytes: 0.03 K/uL (ref 0.00–0.07)
Basophils Absolute: 0.1 K/uL (ref 0.0–0.1)
Basophils Relative: 1 %
Eosinophils Absolute: 0.1 K/uL (ref 0.0–0.5)
Eosinophils Relative: 1 %
HCT: 49.2 % — ABNORMAL HIGH (ref 36.0–46.0)
Hemoglobin: 16.5 g/dL — ABNORMAL HIGH (ref 12.0–15.0)
Immature Granulocytes: 0 %
Lymphocytes Relative: 27 %
Lymphs Abs: 3.2 K/uL (ref 0.7–4.0)
MCH: 30.6 pg (ref 26.0–34.0)
MCHC: 33.5 g/dL (ref 30.0–36.0)
MCV: 91.1 fL (ref 80.0–100.0)
Monocytes Absolute: 1.1 K/uL — ABNORMAL HIGH (ref 0.1–1.0)
Monocytes Relative: 9 %
Neutro Abs: 7.5 K/uL (ref 1.7–7.7)
Neutrophils Relative %: 62 %
Platelets: 239 K/uL (ref 150–400)
RBC: 5.4 MIL/uL — ABNORMAL HIGH (ref 3.87–5.11)
RDW: 12.7 % (ref 11.5–15.5)
WBC: 12 K/uL — ABNORMAL HIGH (ref 4.0–10.5)
nRBC: 0 % (ref 0.0–0.2)

## 2024-04-20 LAB — LIPASE, BLOOD: Lipase: 26 U/L (ref 11–51)

## 2024-04-20 MED ORDER — ONDANSETRON HCL 4 MG/2ML IJ SOLN
4.0000 mg | Freq: Once | INTRAMUSCULAR | Status: AC
  Administered 2024-04-20: 4 mg via INTRAVENOUS
  Filled 2024-04-20: qty 2

## 2024-04-20 MED ORDER — SODIUM CHLORIDE 0.9 % IV BOLUS (SEPSIS)
1000.0000 mL | Freq: Once | INTRAVENOUS | Status: AC
  Administered 2024-04-20: 1000 mL via INTRAVENOUS

## 2024-04-20 MED ORDER — IOHEXOL 350 MG/ML SOLN
100.0000 mL | Freq: Once | INTRAVENOUS | Status: AC | PRN
  Administered 2024-04-20: 100 mL via INTRAVENOUS

## 2024-04-20 MED ORDER — TRAMADOL HCL 50 MG PO TABS: 50.0000 mg | ORAL_TABLET | Freq: Two times a day (BID) | ORAL | 0 refills | Status: AC | PRN

## 2024-04-20 NOTE — ED Provider Notes (Signed)
 "  [facility] Provider Note    Event Date/Time   First MD Initiated Contact with Patient 04/20/24 [phone]     (approximate)   History   No chief complaint on file.   HPI  Elizabeth Spencer is a 39 y.o. female with a past medical history of DVT, cholecystectomy, NSTEMI, CABG x 1, hysterectomy who presents today for evaluation of nausea, vomiting, and abdominal pain.  Patient reports that her pain is primarily right-sided.  It began 2 days ago with multiple episodes of vomiting.  She reports that she also has a history of blood clots and is not on her anticoagulation currently.  She denies chest pain or shortness of breath.  She does report that she has had some intermittent left leg pain.  No diarrhea.  Patient Active Problem List   Diagnosis Date Noted   Acute non-recurrent frontal sinusitis 02/22/2024   Acute cough 02/22/2024   Cyst of ovary 11/23/2023   DDD (degenerative disc disease), lumbar 11/09/2023   Postoperative infection 09/02/2023   Surgical site infection 09/02/2023   Tachycardia 09/02/2023   Sepsis (HCC) 09/01/2023   Cardiac defibrillator in situ 08/30/2023   Chronic systolic heart failure (HCC) 08/08/2023   Vitamin D  deficiency, unspecified 05/08/2023   Acute deep vein thrombosis (DVT) of brachial vein of left upper extremity (HCC) 01/04/2023   Cholecystitis, acute 12/12/2022   Cholelithiasis 12/11/2022   Dyslipidemia 12/11/2022   Hyperthyroidism 12/11/2022   Anxiety and depression 12/11/2022   Asthma, chronic 12/11/2022   Coronary artery disease 12/11/2022   Biliary colic 12/11/2022   Obstructive sleep apnea 10/24/2022   Calculus of gallbladder with acute on chronic cholecystitis without obstruction 10/24/2022   Insomnia 10/24/2022   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   NSTEMI (non-ST elevated myocardial infarction) (HCC) 09/01/2022   Chest pain 08/31/2022   Gastroesophageal reflux disease 12/27/2019   Multinodular goiter 08/08/2018   Vaginal cuff  dehiscence 11/05/2017   S/P laparoscopic hysterectomy 09/29/2017   Chronic tension-type headache, not intractable 05/30/2017   Migraine without aura and without status migrainosus, not intractable 05/30/2017   Arthropathy of lumbar facet joint 04/08/2017   Anxiety 11/08/2013   Obesity 11/08/2013   Bipolar affective disorder (HCC) 05/17/2013   Depression 08/18/2012   Pain of upper abdomen 07/31/2012          Physical Exam   Triage Vital Signs: ED Triage Vitals  Encounter Vitals Group     BP 04/20/24 0620 (!) 142/95     Girls Systolic BP Percentile --      Girls Diastolic BP Percentile --      Boys Systolic BP Percentile --      Boys Diastolic BP Percentile --      Pulse Rate 04/20/24 0620 (!) 131     Resp 04/20/24 0620 18     Temp 04/20/24 0620 97.6 F (36.4 C)     Temp Source 04/20/24 0620 Oral     SpO2 04/20/24 0620 98 %     Weight 04/20/24 0615 240 lb (108.9 kg)     Height 04/20/24 0615 5' 7 (1.702 m)     Head Circumference --      Peak Flow --      Pain Score 04/20/24 0615 10     Pain Loc --      Pain Education --      Exclude from Growth Chart --     Most recent vital signs: Vitals:   04/20/24 0620 04/20/24 1046  BP: (!) 142/95 102/81  Pulse: (!) 131 (!) 102  Resp: 18 18  Temp: 97.6 F (36.4 C) 98 F (36.7 C)  SpO2: 98% 98%    Physical Exam Vitals and nursing note reviewed.  Constitutional:      General: Awake and alert. No acute distress.    Appearance: Normal appearance. The patient is normal weight.  HENT:     Head: Normocephalic and atraumatic.     Mouth: Mucous membranes are moist.  Eyes:     General: PERRL. Normal EOMs        Right eye: No discharge.        Left eye: No discharge.     Conjunctiva/sclera: Conjunctivae normal.  Cardiovascular:     Rate and Rhythm: Tachycardic rate    Pulses: Normal pulses.  Pulmonary:     Effort: Pulmonary effort is normal. No respiratory distress.     Breath sounds: Normal breath sounds.  Abdominal:      Abdomen is soft. There is mild right lower quadrant and left lower quadrant abdominal tenderness. No rebound or guarding. No distention. Musculoskeletal:        General: No swelling. Normal range of motion.     Cervical back: Normal range of motion and neck supple.  Skin:    General: Skin is warm and dry.     Capillary Refill: Capillary refill takes less than 2 seconds.     Findings: No rash.  Neurological:     Mental Status: The patient is awake and alert.      ED Results / Procedures / Treatments   Labs (all labs ordered are listed, but only abnormal results are displayed) Labs Reviewed  CBC WITH DIFFERENTIAL/PLATELET - Abnormal; Notable for the following components:      Result Value   WBC 12.0 (*)    RBC 5.40 (*)    Hemoglobin 16.5 (*)    HCT 49.2 (*)    Monocytes Absolute 1.1 (*)    All other components within normal limits  COMPREHENSIVE METABOLIC PANEL WITH GFR - Abnormal; Notable for the following components:   Glucose, Bld 111 (*)    All other components within normal limits  LIPASE, BLOOD  URINALYSIS, ROUTINE W REFLEX MICROSCOPIC     EKG     RADIOLOGY I independently reviewed and interpreted imaging and agree with radiologists findings.     PROCEDURES:  Critical Care performed:   Procedures   MEDICATIONS ORDERED IN ED: Medications  ondansetron  (ZOFRAN ) injection 4 mg (4 mg Intravenous Given 04/20/24 0841)  sodium chloride  0.9 % bolus 1,000 mL (0 mLs Intravenous Stopped 04/20/24 0910)  iohexol  (OMNIPAQUE ) 350 MG/ML injection 100 mL (100 mLs Intravenous Contrast Given 04/20/24 0832)     IMPRESSION / MDM / ASSESSMENT AND PLAN / ED COURSE  I reviewed the triage vital signs and the nursing notes.   Differential diagnosis includes, but is not limited to, appendicitis, diverticulitis, ovarian cyst, ovarian torsion, dermoid.  Patient is awake and alert, tachycardic to 130s on exam, though normotensive and afebrile.  She is well-appearing.  She is  resting comfortably on the stretcher, playing on her cell phone.  I reviewed the patient's chart.  She was medically complicated with multiple comorbidities.  Further workup is indicated.  Labs were obtained in triage and are overall reassuring, though she does have a leukocytosis to 12.  CT abdomen and pelvis obtained given her abdominal pain, as well as CT PE given her tachycardia and history of DVT.  CTA  was negative for PE, CT abdomen and pelvis reveals interval reaccumulation of a complex right ovarian cyst measuring 8 x 6 x 7 cm status post image guided aspiration on 12/07/2023.  There was recommendation for gynecology consultation performed pelvic ultrasound, and subsequently ultrasound obtained which reveals a right adnexal cyst measuring 7-1/2 x 7 cm, consistent with evolving hemorrhagic cyst without internal blood flow.  There is normal arterial and venous flow to both ovaries.  I consulted Dr. Verdon with OB/GYN.  Dr. Verdon recommends management by her gynecology oncology team, and has coordinated with Elizabeth Spencer.  Just before discharge, Elizabeth Spencer was able to respond.  I discussed with Elizabeth Spencer with gynecology oncology who reports that she is a poor surgical candidate given her multiple comorbidities, though will arrange drainage as an outpatient.  I discussed all of this with the patient who is in agreement with this plan.  She is afraid of her pain returning and requesting tramadol  for pain control at home which was provided.  She was advised that this medication is highly addictive and should not be used for breakthrough pain.  Also advised that she cannot drive, operate heavy machinery, or perform any tasks require concentration while taking this medication.  We discussed the importance of close outpatient follow-up as well as strict return precautions.  Patient understands and agrees with plan.  Discharged in stable condition.  Patient's presentation is most consistent with acute  presentation with potential threat to life or bodily function.   Clinical Course as of 04/20/24 1455  Fri Apr 20, 2024  1126 Left HIPAA compliant message on Dr. Larae cell phone asking to call me back about ER consult, secure chat also sent [JP]  1222 Reached out again to Dr. Verdon [JP]  1315 OB/GYN called back.  Dr. Verdon will speak with gyn-onc and call me back [JP]  1410 Dr. Verdon was unable to reach Elizabeth Spencer with gyn-onc.  She sent her a message.  Dr. Verdon feels that is okay to discharge the patient and will help to arrange follow-up.  Patient is comfortable with this plan. [JP]    Clinical Course User Index [JP] Cherylynn Liszewski E, PA-C     FINAL CLINICAL IMPRESSION(S) / ED DIAGNOSES   Final diagnoses:  Cyst of right ovary     Rx / DC Orders   ED Discharge Orders          Ordered    traMADol  (ULTRAM ) 50 MG tablet  2 times daily PRN        04/20/24 1409             Note:  This document was prepared using Dragon voice recognition software and may include unintentional dictation errors.   Ladell Bey E, PA-C 04/20/24 1455  "

## 2024-04-20 NOTE — ED Notes (Signed)
 See triage note  Presents with some n/v  and right side pain  States pain started about 3-4 days ago Afebrile on arrival

## 2024-04-20 NOTE — ED Triage Notes (Signed)
 Patient ambulatory to triage with steady gait, without difficulty or distress noted; pt reports having N/V x 2 days accomp by pain to rt side; naproxen , tylenol  and tramadol  unrelieved pain

## 2024-04-20 NOTE — Discharge Instructions (Addendum)
 Your case was discussed with Dr. Verdon with OB/GYN who is helping to coordinate your care for follow-up with Tinnie Dawn.  You can also follow-up with Dr. Verdon.  Please call their phone numbers.  Please return for any new, worsening, or changing symptoms or other concerns.  You may take the tramadol  to help with your pain, though remember that this medication is highly addictive and should only be used for breakthrough pain.  You should not take this medication with other sedating medications including benzodiazepines.  It was a pleasure caring for you today.

## 2024-04-23 ENCOUNTER — Telehealth: Payer: Self-pay

## 2024-04-23 NOTE — Telephone Encounter (Signed)
 Spoke with Elizabeth Spencer and provided cyst drainage instructions. All questions answered.  Procedure scheduled for 05/02/2024  Arrive at 1000  for 1100 appointment Come into the Heart and Vascular entrance at [facility]. This entrance is located in the front of the hospital. Do not eat or drink anything after midnight Take your regularly scheduled blood pressure, pain, or seizure medication You will need a driver for this procedure. Hold aspiring for 5 days prior. Last dose 04/26/2024. She is currently not on her plavix .

## 2024-04-29 ENCOUNTER — Other Ambulatory Visit: Payer: Self-pay | Admitting: Radiology

## 2024-04-29 DIAGNOSIS — N83209 Unspecified ovarian cyst, unspecified side: Secondary | ICD-10-CM

## 2024-05-01 ENCOUNTER — Other Ambulatory Visit (HOSPITAL_COMMUNITY): Payer: Self-pay | Admitting: Student

## 2024-05-01 ENCOUNTER — Telehealth: Payer: Self-pay

## 2024-05-01 NOTE — Telephone Encounter (Signed)
 Patient for CT guided ovarian cyst aspiration, I called and spoke with the patient on the phone and gave pre-procedure instructions. Pt was made aware to be here at 10a, last dose of ASA 81mg  was Thurs 04/26/24, NPO after MN prior to procedure as well as driver post procedure/recovery/discharge. Pt stated understanding. Called 05/01/24

## 2024-05-01 NOTE — Progress Notes (Signed)
 Approved for CT guided aspiration.  Please send for cytology.  + sedation  HKM

## 2024-05-02 ENCOUNTER — Ambulatory Visit
Admission: RE | Admit: 2024-05-02 | Discharge: 2024-05-02 | Disposition: A | Payer: MEDICAID | Source: Ambulatory Visit | Attending: Diagnostic Radiology | Admitting: Diagnostic Radiology

## 2024-05-02 ENCOUNTER — Other Ambulatory Visit: Payer: Self-pay | Admitting: Diagnostic Radiology

## 2024-05-02 ENCOUNTER — Ambulatory Visit
Admission: RE | Admit: 2024-05-02 | Discharge: 2024-05-02 | Disposition: A | Discharge status: Home / Self Care | Payer: MEDICAID | Source: Ambulatory Visit | Attending: Nurse Practitioner | Admitting: Nurse Practitioner

## 2024-05-02 DIAGNOSIS — N83201 Unspecified ovarian cyst, right side: Secondary | ICD-10-CM | POA: Diagnosis present

## 2024-05-02 DIAGNOSIS — N83209 Unspecified ovarian cyst, unspecified side: Secondary | ICD-10-CM | POA: Insufficient documentation

## 2024-05-02 LAB — CBC
HCT: 47.3 % — ABNORMAL HIGH (ref 36.0–46.0)
Hemoglobin: 15.6 g/dL — ABNORMAL HIGH (ref 12.0–15.0)
MCH: 30.1 pg (ref 26.0–34.0)
MCHC: 33 g/dL (ref 30.0–36.0)
MCV: 91.3 fL (ref 80.0–100.0)
Platelets: 264 10*3/uL (ref 150–400)
RBC: 5.18 MIL/uL — ABNORMAL HIGH (ref 3.87–5.11)
RDW: 12.9 % (ref 11.5–15.5)
WBC: 8.2 10*3/uL (ref 4.0–10.5)
nRBC: 0 % (ref 0.0–0.2)

## 2024-05-02 LAB — PROTIME-INR
INR: 0.9 (ref 0.8–1.2)
Prothrombin Time: 13 s (ref 11.4–15.2)

## 2024-05-02 MED ORDER — MIDAZOLAM HCL (PF) 2 MG/2ML IJ SOLN
INTRAMUSCULAR | Status: AC | PRN
  Administered 2024-05-02 (×2): 1 mg via INTRAVENOUS

## 2024-05-02 MED ORDER — SODIUM CHLORIDE 0.9 % IV SOLN: INTRAVENOUS | Status: DC

## 2024-05-02 MED ORDER — ACETAMINOPHEN 500 MG PO TABS: 500.0000 mg | ORAL_TABLET | ORAL | Status: DC | PRN

## 2024-05-02 MED ORDER — MIDAZOLAM HCL 5 MG/5ML IJ SOLN
INTRAMUSCULAR | Status: AC | PRN
  Administered 2024-05-02: 2 mg via INTRAVENOUS

## 2024-05-02 MED ORDER — MIDAZOLAM HCL 2 MG/2ML IJ SOLN
INTRAMUSCULAR | Status: AC
  Filled 2024-05-02: qty 4

## 2024-05-02 NOTE — H&P (Signed)
 "   Chief Complaint:  Symptomatic Right Ovarian Cyst  Procedure: Percutaneous Aspiration  Referring Provider(s): Tinnie Dawn, NP  Supervising Physician: Philip Cornet  Patient Status: [facility] - Out-pt  History of Present Illness: Elizabeth Spencer is a 39 y.o. female with a history of bipolar affective disorder, drug use, factor V Leiden with DVT of LUE, CAD s/p CABG, ischemic cardiomyopathy with ICD implant/explant, chronic pelvic pain with AUB s/p hysterectomy with bilateral salpingectomy in 2019, and enlarging right ovarian cyst. Per chart review, patient was initially referred to Gyn-Onc in 11/2023 due to an enlarging pelvic cystic mass. Workup revealed no concern for malignancy. Patient endorse associated pelvic discomfort, but was not deemed an appropriate surgical candidate due to her co-morbidities. She was subsequently referred to IR in 12/2023 for consideration of right ovarian cyst drainage which she underwent on 12/07/23 with Dr. Philip. Patient was reportedly doing well up until about a week ago when she presented to the ED for abdominal pain, nausea, and vomiting. Workup was grossly unremarkable, but imaging again revealed a complex right ovarian cyst measuring 8.4cm x 6.8cm x 7.2cm. Follow up US  revealed low-level echoes consistent with an evolving hemorrhagic cyst. Gyn-Onc consulted and Tinnie Dawn, NP again recommending outpatient drainage of cyst for symptomatic relief.   Patient is sitting upright in bed in no acute distress. States that she has been feeling well since her discharge from the ED. Denies any fevers/chills, nausea/vomiting, chest pain, or shortness of breath. Endorses intermittent RLQ abdominal pain with certain movements and palpation, but is otherwise without concerns. NPO since midnight. ASA held since 1/19. Not currently on Plavix .   Patient is Full Code  Past Medical History:  Diagnosis Date   Aneurysm 01/04/2014   Arthritis    Asthma    WELL CONTROLLED   Brain  aneurysm 2007   NEUROLOGY NOTE DOES NOT MENTION ANEURYSM BUT PT STATES SHE DID NOT HAVE TO HAVE SURGERY   Complication of anesthesia    FOR 1 C-SECTION PT WAS ITCHING AND VERY RED ON HER FACE   Cough, persistent 01/27/2016   Dermatitis due to sunburn 11/08/2013   Family history of adverse reaction to anesthesia    brother, neice and nephew got red in face with hives   Gallstones    GERD (gastroesophageal reflux disease)    Gonorrhea 06/20/2020   Headache    CHRONIC HEADACHES   History of chronic cough    DRY   Loss of memory 05/30/2017   Perforation of left tympanic membrane 05/17/2013   Pneumonia    PTSD (post-traumatic stress disorder)    PTSD (post-traumatic stress disorder)    Thyroid  condition    PT WAS JUST TOLD ON 08-25-17 THAT SHE HAS A THYROID  PROBLEM AND IS GOING TO F/U WITH ENDOCRINOLOGIST IN 2 WEEKS   Trichomonas vaginalis (TV) infection 06/20/2020    Past Surgical History:  Procedure Laterality Date   ABDOMINAL HYSTERECTOMY     CESAREAN SECTION     x3   CORONARY ARTERY BYPASS GRAFT N/A 09/02/2022   Procedure: CORONARY ARTERY BYPASS GRAFTING (CABG) X 1 USING LEFT INTERNAL MAMMARY ARTERY;  Surgeon: Lucas Dorise POUR, MD;  Location: [facility] OR;  Service: Open Heart Surgery;  Laterality: N/A;   CYSTOSCOPY N/A 09/29/2017   Procedure: CYSTOSCOPY;  Surgeon: Lake Read, MD;  Location: [facility] ORS;  Service: Gynecology;  Laterality: N/A;   CYSTOSCOPY N/A 11/05/2017   Procedure: CYSTOSCOPY;  Surgeon: Lake Read, MD;  Location: [facility] ORS;  Service: Gynecology;  Laterality:  N/A;   HYSTEROSCOPY WITH D & C N/A 09/01/2017   Procedure: DILATATION AND CURETTAGE /HYSTEROSCOPY;  Surgeon: Lake Read, MD;  Location: [facility] ORS;  Service: Gynecology;  Laterality: N/A;   IMPLANT POCKET INCISION & DRAINAGE N/A 10/06/2023   Procedure: IMPLANT POCKET INCISION & DRAINAGE;  Surgeon: Kennyth Chew, MD;  Location: [facility] INVASIVE CV LAB;  Service: Cardiovascular;  Laterality: N/A;    LAPAROSCOPY N/A 09/01/2017   Procedure: LAPAROSCOPY OPERATIVE with biopsy;  Surgeon: Lake Read, MD;  Location: [facility] ORS;  Service: Gynecology;  Laterality: N/A;   LAPAROSCOPY N/A 11/05/2017   Procedure: LAPAROSCOPY DIAGNOSTIC;  Surgeon: Lake Read, MD;  Location: [facility] ORS;  Service: Gynecology;  Laterality: N/A;   LEFT HEART CATH AND CORONARY ANGIOGRAPHY N/A 09/01/2022   Procedure: LEFT HEART CATH AND CORONARY ANGIOGRAPHY;  Surgeon: Mady Bruckner, MD;  Location: [facility] INVASIVE CV LAB;  Service: Cardiovascular;  Laterality: N/A;   REPAIR VAGINAL CUFF N/A 11/05/2017   Procedure: REPAIR VAGINAL CUFF;  Surgeon: Lake Read, MD;  Location: [facility] ORS;  Service: Gynecology;  Laterality: N/A;   SUBQ ICD IMPLANT N/A 08/08/2023   Procedure: SUBQ ICD IMPLANT;  Surgeon: Kennyth Chew, MD;  Location: [facility] INVASIVE CV LAB;  Service: Cardiovascular;  Laterality: N/A;   SUBQ ICD REVISION N/A 08/09/2023   Procedure: SUBQ ICD REVISION;  Surgeon: Kennyth Chew, MD;  Location: [facility] INVASIVE CV LAB;  Service: Cardiovascular;  Laterality: N/A;   SUBQ ICD REVISION N/A 09/05/2023   Procedure: SUBQ ICD REVISION;  Surgeon: Kennyth Chew, MD;  Location: [facility] INVASIVE CV LAB;  Service: Cardiovascular;  Laterality: N/A;   TEE WITHOUT CARDIOVERSION N/A 09/02/2022   Procedure: TRANSESOPHAGEAL ECHOCARDIOGRAM;  Surgeon: Lucas Dorise POUR, MD;  Location: [facility] OR;  Service: Open Heart Surgery;  Laterality: N/A;   TOTAL LAPAROSCOPIC HYSTERECTOMY WITH SALPINGECTOMY Bilateral 09/29/2017   Procedure: TOTAL LAPAROSCOPIC HYSTERECTOMY WITH SALPINGECTOMY;  Surgeon: Lake Read, MD;  Location: [facility] ORS;  Service: Gynecology;  Laterality: Bilateral;    Allergies: Azithromycin, Clindamycin , Codeine, Meloxicam , Neurontin [gabapentin], Penicillins, Other, Paxil [paroxetine], Diphenhydramine  hcl, Doxycycline, and Egg protein-containing drug products  Medications: Prior to Admission medications  Medication Sig Start  Date End Date Taking? Authorizing Provider  acetaminophen  (TYLENOL ) 500 MG tablet Take 1,000 mg by mouth every 8 (eight) hours as needed for moderate pain (pain score 4-6).    [provider]  albuterol  (VENTOLIN  HFA) 108 (90 Base) MCG/ACT inhaler Inhale 2 puffs into the lungs every 6 (six) hours as needed for wheezing or shortness of breath. 04/16/24   Orlean Alan HERO, FNP  ALPRAZolam  (ALPRAZOLAM  XR) 0.5 MG 24 hr tablet Take 1 tablet (0.5 mg total) by mouth 2 (two) times daily. 04/16/24   Orlean Alan HERO, FNP  aspirin  81 MG chewable tablet Chew 1 tablet (81 mg total) by mouth daily. 04/16/24   Orlean Alan HERO, FNP  atorvastatin  (LIPITOR) 40 MG tablet Take 1 tablet (40 mg total) by mouth daily. 04/16/24   Orlean Alan HERO, FNP  Biotin w/ Vitamins C & E (HAIR SKIN & NAILS GUMMIES PO) Take 1 tablet by mouth daily. Biotin    [provider]  cetirizine  (ZYRTEC ) 10 MG tablet Take 1 tablet (10 mg total) by mouth daily. 04/16/24   Orlean Alan HERO, FNP  clopidogrel  (PLAVIX ) 75 MG tablet Take 1 tablet (75 mg total) by mouth daily. Patient not taking: Reported on 04/16/2024 08/01/23   Donette Ellouise LABOR, FNP  diphenhydrAMINE  (BENADRYL ) 25 MG tablet Take 50 mg by mouth every 6 (six) hours  as needed (allergic reaction). Dye and glutin free    [provider]  empagliflozin  (JARDIANCE ) 10 MG TABS tablet Take 1 tablet (10 mg total) by mouth daily before breakfast. 04/04/24   Donette Ellouise LABOR, FNP  EPIPEN  2-PAK 0.3 MG/0.3ML SOAJ injection INJECT INTO THIGH MUSCLE THROUGH CLOTHES AS NEEDED SEVERE ALLERGIC REACTION 08/01/23   Orlean Alan HERO, FNP  fluticasone  (FLONASE ) 50 MCG/ACT nasal spray 1 spray by Each Nare route daily. 02/22/24   Scoggins, Amber, NP  furosemide  (LASIX ) 20 MG tablet Take 1 tablet (20 mg total) by mouth daily as needed. 04/04/24 07/03/24  Donette Ellouise LABOR, FNP  Hypertonic Nasal Wash (SINUS RINSE REFILL) PACK Place 1 each into the nose in the morning and at bedtime. 03/09/24    Orlean Alan HERO, FNP  methimazole  (TAPAZOLE ) 10 MG tablet Take 1 tablet (10 mg total) by mouth daily. Patient taking differently: Take 15 mg by mouth daily. Total 15 mg 07/14/23   Orlean Alan HERO, FNP  metoprolol  succinate (TOPROL  XL) 25 MG 24 hr tablet Take 1 tablet (25 mg total) by mouth daily. 04/04/24   Donette Ellouise LABOR, FNP  montelukast  (SINGULAIR ) 10 MG tablet Take 1 tablet (10 mg total) by mouth at bedtime. 04/16/24   Orlean Alan HERO, FNP  naloxone  (NARCAN ) nasal spray 4 mg/0.1 mL Place 1 spray into the nose Once PRN for up to 1 dose (opioid overdose). 11/08/23   Orlean Alan HERO, FNP  naproxen  (NAPROSYN ) 375 MG tablet Take 1 tablet (375 mg total) by mouth 2 (two) times daily with a meal. 04/16/24   Orlean Alan HERO, FNP  omeprazole  (PRILOSEC) 20 MG capsule Take 1 capsule (20 mg total) by mouth 2 (two) times daily. 08/01/23   Orlean Alan HERO, FNP  ondansetron  (ZOFRAN -ODT) 4 MG disintegrating tablet Take 1 tablet (4 mg total) by mouth every 6 (six) hours as needed for nausea or vomiting. 11/03/23   Ward, Josette SAILOR, DO  spironolactone  (ALDACTONE ) 25 MG tablet Take 1 tablet (25 mg total) by mouth daily. 04/04/24   Donette Ellouise LABOR, FNP  traMADol  (ULTRAM ) 50 MG tablet Take 1 tablet (50 mg total) by mouth 2 (two) times daily as needed. 04/20/24   Poggi, Jenna E, PA-C     Family History  Problem Relation Age of Onset   Hypertension Mother    Alcohol abuse Mother    Deep vein thrombosis Mother    Alcohol abuse Father    COPD Father    Emphysema Father    Heart disease Father     Social History   Socioeconomic History   Marital status: Single    Spouse name: Not on file   Number of children: Not on file   Years of education: Not on file   Highest education level: Not on file  Occupational History   Not on file  Tobacco Use   Smoking status: Every Day    Current packs/day: 0.50    Average packs/day: 0.5 packs/day for 18.0 years (9.0 ttl pk-yrs)    Types: Cigarettes    Passive  exposure: Past   Smokeless tobacco: Never  Vaping Use   Vaping status: Never Used  Substance and Sexual Activity   Alcohol use: Not Currently   Drug use: Not Currently    Types: Marijuana, Cocaine   Sexual activity: Yes    Partners: Male    Birth control/protection: Surgical    Comment: Hysterectomy  Other Topics Concern   Not on file  Social History Narrative  Not on file   Social Drivers of Health   Tobacco Use: High Risk (04/20/2024)   Patient History    Smoking Tobacco Use: Every Day    Smokeless Tobacco Use: Never    Passive Exposure: Past  Financial Resource Strain: Not on file  Food Insecurity: No Food Insecurity (11/09/2023)   Epic    Worried About Programme Researcher, Broadcasting/film/video in the Last Year: Never true    Ran Out of Food in the Last Year: Never true  Transportation Needs: No Transportation Needs (11/09/2023)   Epic    Lack of Transportation (Medical): No    Lack of Transportation (Non-Medical): No  Physical Activity: Not on file  Stress: Not on file  Social Connections: Unknown (08/08/2023)   Social Connection and Isolation Panel    Frequency of Communication with Friends and Family: More than three times a week    Frequency of Social Gatherings with Friends and Family: More than three times a week    Attends Religious Services: Not on file    Active Member of Clubs or Organizations: Not on file    Attends Banker Meetings: Not on file    Marital Status: Living with partner  Depression (PHQ2-9): Low Risk (11/09/2023)   Depression (PHQ2-9)    PHQ-2 Score: 0  Alcohol Screen: Not on file  Housing: Unknown (11/24/2023)   Received from [facility]   Epic    Unable to Pay for Housing in the Last Year: Not on file    Number of Times Moved in the Last Year: Not on file    At any time in the past 12 months, were you homeless or living in a shelter (including now)?: No  Utilities: Not At Risk (11/09/2023)   Epic    Threatened with loss of utilities:  No  Health Literacy: Not on file    Review of Systems  Gastrointestinal:  Positive for abdominal pain (intermittent RLQ).  Patient denies any headache, chest pain, shortness of breath, N/V, or fever/chills. All other systems are negative.   Vital Signs: LMP 08/22/2017 Comment: says she just spots.   Physical Exam Vitals reviewed.  Constitutional:      Appearance: Normal appearance.  HENT:     Mouth/Throat:     Mouth: Mucous membranes are moist.     Pharynx: Oropharynx is clear.  Cardiovascular:     Rate and Rhythm: Normal rate and regular rhythm.     Heart sounds: Normal heart sounds.  Pulmonary:     Effort: Pulmonary effort is normal.     Breath sounds: Normal breath sounds.  Abdominal:     General: Abdomen is flat.     Palpations: Abdomen is soft.     Tenderness: There is abdominal tenderness (minimal RLQ).  Skin:    General: Skin is warm and dry.  Neurological:     Mental Status: She is alert and oriented to person, place, and time.  Psychiatric:        Behavior: Behavior normal.     Imaging: US  PELVIS TRANSVAGINAL NON-OB (TV ONLY) Result Date: 04/23/2024 EXAM: PELVIC ULTRASOUND 04/17/2024 02:58:13 PM TECHNIQUE: Transvaginal pelvic duplex ultrasound using B-mode/gray scaled imaging with Doppler spectral analysis and color flow was obtained. COMPARISON: US  Pelvis 11/03/2023; 12/14/2016. Comparison CT abdomen and pelvis 04/10/2024. CLINICAL HISTORY: Reevaluate ovarian cyst. FINDINGS: ULTRASOUND FINDINGS: UTERUS: Not imaged RIGHT OVARY: Right ovary measures 7.7 x 5.1 x 7.6 cm. In the right ovary, there is a well-circumscribed cystic structure containing  diffuse internal debris measuring 7.5 x 4.7 x 7.1 cm. There is normal arterial and venous Doppler flow. LEFT OVARY: Left ovary measures 4.5 x 3.0 x 3.6 cm. There is an anechoic cyst in the left ovary measuring 2.7 cm. There is normal arterial and venous Doppler flow. FREE FLUID: No free fluid. IMPRESSION: 1. Right ovarian  mildly complicated cyst with internal debris measuring up to 7.5 cm, stable in size. 2. Left ovarian simple cyst/follicle measuring 2.7 cm, with no follow-up imaging recommended. Electronically signed by: Greig Pique MD 04/23/2024 10:13 PM EST RP Workstation: [id number]   US  PELVIC COMPLETE W TRANSVAGINAL AND TORSION R/O Result Date: 04/20/2024 EXAM: US  Pelvis, Complete Transvaginal and Transabdominal with Doppler 04/20/2024 10:40:42 AM TECHNIQUE: Transabdominal and transvaginal pelvic duplex ultrasound using B-mode/gray scaled imaging with Doppler spectral analysis and color flow was obtained. COMPARISON: US  Pelvis 04/17/2024; 11/03/2023. CLINICAL HISTORY: Abdominal pain, RLQ. FINDINGS: UTERUS: Status post hysterectomy. ENDOMETRIAL STRIPE: Not applicable. RIGHT OVARY: Right ovary measures 8.9 x 6.1 x 7.7 cm. There is a right adnexal cyst again demonstrated measuring approximately 7.6 x 5.6 x 6.9 cm. There are diffuse low-level echos within it. There are less echogenic foci within it than on the previous study, suggesting an evolving hemorrhagic cyst. There is no demonstrable blood flow within it. Inclusive of the cyst, the right ovary measures 8.9 x 6.1 x 7.7 cm with an estimated volume of 718 ml. There is normal arterial and venous blood flow. LEFT OVARY: Left ovary measures 1.7 x 0.9 x 1.4 cm. Left ovary is within normal limits. There is normal arterial and venous blood flow. FREE FLUID: No free fluid. IMPRESSION: 1. Right adnexal cyst measuring approximately 7.6 x 5.6 x 6.9 cm with diffuse low-level echoes, fewer than prior study, consistent with an evolving hemorrhagic cyst. No internal blood flow. Normal arterial and venous flow to both ovaries. No free fluid. 2. Status post hysterectomy. Electronically signed by: Evalene Coho MD 04/20/2024 11:05 AM EST RP Workstation: [id number]   CT ABDOMEN PELVIS W CONTRAST Result Date: 04/20/2024 EXAM: CT ABDOMEN AND PELVIS WITH CONTRAST 04/20/2024 08:35:21 AM  TECHNIQUE: CT of the abdomen and pelvis was performed with the administration of 100 mL of iohexol  (OMNIPAQUE ) 350 MG/ML injection. Multiplanar reformatted images are provided for review. Automated exposure control, iterative reconstruction, and/or weight-based adjustment of the mA/kV was utilized to reduce the radiation dose to as low as reasonably achievable. COMPARISON: 11/17/2023 CLINICAL HISTORY: Abdominal pain, acute, nonlocalized; RLQ abdominal pain. FINDINGS: LOWER CHEST: Unchanged right lower lobe lung nodule measuring 5 mm. Previously characterized as benign nodule based on stability. LIVER: The liver is unremarkable. GALLBLADDER AND BILE DUCTS: Status post cholecystectomy. No biliary ductal dilatation. SPLEEN: The spleen is within normal limits in size and appearance. PANCREAS: The pancreas is normal in size and contour without focal lesion or ductal dilatation. ADRENAL GLANDS: Normal size and morphology bilaterally. No nodule, thickening, or hemorrhage. No periadrenal stranding. KIDNEYS, URETERS AND BLADDER: No stones in the kidneys or ureters. No hydronephrosis. No perinephric or periureteral stranding. Urinary bladder is unremarkable. GI AND BOWEL: The stomach is normal. No bowel wall thickening, inflammation, or distention. The appendix is visualized and normal in caliber, without wall thickening, periappendiceal inflammation, or fluid. PERITONEUM AND RETROPERITONEUM: No free fluid or fluid collections identified. No signs of pneumoperitoneum. VASCULATURE: Aorta is normal in caliber. LYMPH NODES: No lymphadenopathy. REPRODUCTIVE ORGANS: There has been interval reaccumulation of the complex right ovarian cyst which measures 8.4 x 6.8 x 7.2 cm. On the previous  exam this measured 8.7 x 9.5 x 9.4 cm. On 12/07/2023, the patient underwent image-guided aspiration of this cyst with removal of 400 cc of fluid. The cyst in the left ovary measures 3.2 x 2.7 cm, new from previous exam. BONES AND SOFT TISSUES:  Bony structures are normal. No focal soft tissue abnormality. IMPRESSION: 1. Interval reaccumulation of a complex right ovarian cyst measuring 8.4 x 6.8 x 7.2 cm, status post image-guided aspiration on 12/07/2023, with differential considerations including hemorrhagic cyst, endometrioma, or cystic ovarian neoplasm, and gynecology consultation with prompt pelvic ultrasound or contrast-enhanced pelvic MRI recommended for characterization. Correlation with serum tumor markers (e.g., CA-125) may be considered. 2. New left ovarian cyst measuring 3.2 x 2.7 cm, likely physiologic, with no follow-up imaging recommended. Electronically signed by: Waddell Calk MD 04/20/2024 08:52 AM EST RP Workstation: [id number]   CT Angio Chest PE W and/or Wo Contrast Result Date: 04/20/2024 CLINICAL DATA:  Right-sided pain EXAM: CT ANGIOGRAPHY CHEST WITH CONTRAST TECHNIQUE: Multidetector CT imaging of the chest was performed using the standard protocol during bolus administration of intravenous contrast. Multiplanar CT image reconstructions and MIPs were obtained to evaluate the vascular anatomy. RADIATION DOSE REDUCTION: This exam was performed according to the departmental dose-optimization program which includes automated exposure control, adjustment of the mA and/or kV according to patient size and/or use of iterative reconstruction technique. CONTRAST:  OMNIPAQUE  IOHEXOL  350 MG/ML SOLN COMPARISON:  November 17, 2023 FINDINGS: Cardiovascular: Satisfactory opacification of the pulmonary arteries to the segmental level. No evidence of pulmonary embolism. Normal heart size. No pericardial effusion. Mediastinum/Nodes: Multiple nodules are noted in both thyroid  lobes. Esophagus is unremarkable. No significant adenopathy is noted. Lungs/Pleura: Stable 5 mm nodule is noted in right posterior costophrenic sulcus best seen on image number 92 of series 5. No pneumothorax or pleural effusion is noted. Upper Abdomen: No acute abnormality.  Musculoskeletal: No chest wall abnormality. No acute or significant osseous findings. Review of the MIP images confirms the above findings. IMPRESSION: 1. No definite evidence of pulmonary embolus. 2. Stable 5 mm nodule seen in right posterior costophrenic sulcus. No follow-up needed if patient is low-risk.This recommendation follows the consensus statement: Guidelines for Management of Incidental Pulmonary Nodules Detected on CT Images: From the Fleischner Society 2017; Radiology 2017; 284:228-243. 3. Multiple nodules are noted in both thyroid  lobes. Recommend thyroid  ultrasound. (Ref: J Am Coll Radiol. 2015 Feb;12(2): 143-50). Electronically Signed   By: Lynwood Landy Raddle M.D.   On: 04/20/2024 08:48    Labs:  CBC: Recent Labs    12/07/23 1030 03/28/24 2238 04/16/24 1359 04/20/24 0641  WBC 7.7 11.2* 10.8 12.0*  HGB 14.9 15.1* 16.3* 16.5*  HCT 45.3 45.4 49.2* 49.2*  PLT 224 230 231 239    COAGS: Recent Labs    05/25/23 1620 12/07/23 1030  INR 1.1 1.0    BMP: Recent Labs    10/07/23 0307 11/02/23 2315 12/06/23 1443 03/28/24 2238 04/16/24 1359 04/20/24 0641  NA 137 137 142 139 139 136  K 3.8 3.3* 4.4 3.7 3.9 4.0  CL 111 107 107* 106 106 101  CO2 17* 21* 19* 18* CANCELED 22  GLUCOSE 110* 109* 81 98 112* 111*  BUN 9 8 10 8 6 7   CALCIUM  8.9 9.2 9.2 8.9 9.3 9.7  CREATININE 0.45 0.66 0.72 0.71 0.84 0.80  GFRNONAA >60 >60  --  >60  --  >60    LIVER FUNCTION TESTS: Recent Labs    11/02/23 2315 12/06/23 1443 04/16/24 1359 04/20/24  0641  BILITOT 0.5 0.2 0.3 0.9  AST 18 16 19 18   ALT 16 16 16 14   ALKPHOS 77 88 80 85  PROT 6.9 6.8 6.6 7.6  ALBUMIN  3.9 4.2 4.4 4.5    TUMOR MARKERS: No results for input(s): AFPTM, CEA, CA199, CHROMGRNA in the last 8760 hours.  Assessment and Plan:  Recurrent, symptomatic right ovarian cyst: DESREE LEAP is a 39 y.o. female with a complicated medical history including chronic pelvic pain and AUB s/p hysterectomy with  bilateral salpingectomy in 2019 with continued intermittent R>L pelvic pain. Patient has previously been seen by IR for percutaneous aspiration of symptomatic right sided ovarian cyst. She was recently seen in the ED for abdominal pain, nausea, and vomiting thought to be associated with her recurrent right ovarian cyst. Cyst noted to be significantly smaller than it was for previous aspiration. Patient again referred to IR for aspiration. Procedure to be performed under light sedation.  -Procedure to be completed with Versed  only at patient's request -Based on chart, patient tolerated previous aspiration well with 4mg  Versed   Risks and benefits of ovarian cyst aspiration were discussed with the patient including bleeding, infection, damage to adjacent structures, and sepsis.  All of the patient's questions were answered, patient is agreeable to proceed. Consent signed and in chart.   Thank you for allowing our service to participate in DWAYNA KENTNER 's care.    Electronically Signed: Glennon CHRISTELLA Bal, PA-C   05/02/2024, 10:38 AM     I spent a total of 15 Minutes in face to face in clinical consultation, greater than 50% of which was counseling/coordinating care for symptomatic right ovarian cyst. "

## 2024-05-02 NOTE — Procedures (Signed)
 Interventional Radiology Procedure:   Indications: Recurrent right ovarian cyst  Procedure: CT guided aspiration of right ovarian cyst  Findings: 20 gauge spinal needle placed in right ovarian cyst.  Removed 140 ml of opaque brown fluid.    Complications: None     EBL: Minimal  Plan: Bedrest 1 hour, then discharge to home.    Verl Kitson R. Philip, MD  Pager: [phone]

## 2024-05-03 ENCOUNTER — Other Ambulatory Visit: Payer: MEDICAID

## 2024-05-07 LAB — CYTOLOGY - NON PAP

## 2024-05-09 ENCOUNTER — Inpatient Hospital Stay: Payer: MEDICAID | Attending: Obstetrics and Gynecology | Admitting: Obstetrics and Gynecology

## 2024-05-09 VITALS — BP 102/89 | HR 105 | Temp 98.6°F | Resp 19 | Wt 250.8 lb

## 2024-05-09 DIAGNOSIS — N83201 Unspecified ovarian cyst, right side: Secondary | ICD-10-CM

## 2024-05-09 DIAGNOSIS — Z113 Encounter for screening for infections with a predominantly sexual mode of transmission: Secondary | ICD-10-CM | POA: Insufficient documentation

## 2024-05-09 DIAGNOSIS — R1900 Intra-abdominal and pelvic swelling, mass and lump, unspecified site: Secondary | ICD-10-CM | POA: Insufficient documentation

## 2024-05-09 LAB — CHLAMYDIA/NGC RT PCR (ARMC ONLY)
Chlamydia Tr: NOT DETECTED
N gonorrhoeae: NOT DETECTED

## 2024-05-09 NOTE — Progress Notes (Signed)
 Gynecologic Oncology Interval Visit   Referring Provider: Dr Verdon  Chief Complaint: Pelvic Mass  Subjective:  Elizabeth Spencer is a 39 y.o. G25P3003 female with a past medical history of bipolar affective disorder, CAD s/p CABG, ischemic cardiomyopathy with ICD implant and explant, systolic CHF with EF 30-35%, cocaine use, and factor V Leiden with DVT of LUE, who is seen in consultation from Dr. Verdon for enlarging pelvic cystic mass.    05/02/24 image guided drainage of 7.3 cm mass with negative cytology. Removed 140 ml of brown fluid and cytology negative.   She is tired of the procedures and wants surgery to remove the mass.  Still has pain.   Also aspirated 9/25 with 400 ml removed and negative cytology.  Gynecologic History:  She has a history of hysterectomy for abnormal uterine bleeding and chronic pelvic pain with Dr. Lake in June 2019 with vaginal cuff dehiscence post repair 11/2017.   Pap- 08/20/17- NILM HPV Negative.  Endometrial biopsy 09/02/17-secretory endometrium 12/14/2016-preoperative ultrasound was reported as normal. 09/02/2017-diagnostic laparoscopy showed some mild adhesive disease in the left adnexa excised prior tubal ligation.  12/12/22- CT Abdomen Pelvis w/ Contrast w/ Dr Jordis - 7.4 cm simple appearing left ovarian cyst. Recommend follow-up pelvic US  in 3-6 months. - Moderate cholelithiasis and sludge with findings suggesting gallbladder wall thickening. Findings are concerning for acute cholecystitis, although recent right upper quadrant ultrasound and HIDA scan suggest no evidence of acute cholecystitis. She underwent cholecystectomy and did not have follow-up ultrasound.  For surgical evaluation.  Given her medical issues and benign appearance on imaging recommended close surveillance.   11/17/2023 CT C/A/P A cystic mass within the central pelvis, favored to arise from the right ovary, measures 8.7 by 9.5 cm on 101/2. Compare 9.9 x 9.4 cm when measured in a  similar fashion on the prior. 9.4 cm craniocaudal today on sagittal image 81 versus 9.0 cm on the prior when remeasured in a similar fashion.  Cancer Antigen (CA) 125 59.2 High    HE4 58.5   Inhibin B 53.7   Inhibin-A 28.8   CEA 2.3    Problem List: Patient Active Problem List   Diagnosis Date Noted   Acute non-recurrent frontal sinusitis 02/22/2024   Acute cough 02/22/2024   Cyst of ovary 11/23/2023   DDD (degenerative disc disease), lumbar 11/09/2023   Postoperative infection 09/02/2023   Surgical site infection 09/02/2023   Tachycardia 09/02/2023   Sepsis (HCC) 09/01/2023   Cardiac defibrillator in situ 08/30/2023   Chronic systolic heart failure (HCC) 08/08/2023   Vitamin D  deficiency, unspecified 05/08/2023   Acute deep vein thrombosis (DVT) of brachial vein of left upper extremity (HCC) 01/04/2023   Cholecystitis, acute 12/12/2022   Cholelithiasis 12/11/2022   Dyslipidemia 12/11/2022   Hyperthyroidism 12/11/2022   Anxiety and depression 12/11/2022   Asthma, chronic 12/11/2022   Coronary artery disease 12/11/2022   Biliary colic 12/11/2022   Obstructive sleep apnea 10/24/2022   Calculus of gallbladder with acute on chronic cholecystitis without obstruction 10/24/2022   Insomnia 10/24/2022   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   NSTEMI (non-ST elevated myocardial infarction) (HCC) 09/01/2022   Chest pain 08/31/2022   Gastroesophageal reflux disease 12/27/2019   Multinodular goiter 08/08/2018   Vaginal cuff dehiscence 11/05/2017   S/P laparoscopic hysterectomy 09/29/2017   Chronic tension-type headache, not intractable 05/30/2017   Migraine without aura and without status migrainosus, not intractable 05/30/2017   Arthropathy of lumbar facet joint 04/08/2017   Anxiety 11/08/2013   Obesity  11/08/2013   Bipolar affective disorder (HCC) 05/17/2013   Depression 08/18/2012   Pain of upper abdomen 07/31/2012   Past Medical History: Past Medical History:  Diagnosis Date    Aneurysm 01/04/2014   Arthritis    Asthma    WELL CONTROLLED   Brain aneurysm 2007   NEUROLOGY NOTE DOES NOT MENTION ANEURYSM BUT PT STATES SHE DID NOT HAVE TO HAVE SURGERY   Complication of anesthesia    FOR 1 C-SECTION PT WAS ITCHING AND VERY RED ON HER FACE   Cough, persistent 01/27/2016   Dermatitis due to sunburn 11/08/2013   Family history of adverse reaction to anesthesia    brother, neice and nephew got red in face with hives   Gallstones    GERD (gastroesophageal reflux disease)    Gonorrhea 06/20/2020   Headache    CHRONIC HEADACHES   History of chronic cough    DRY   Loss of memory 05/30/2017   Perforation of left tympanic membrane 05/17/2013   Pneumonia    PTSD (post-traumatic stress disorder)    PTSD (post-traumatic stress disorder)    Thyroid  condition    PT WAS JUST TOLD ON 08-25-17 THAT SHE HAS A THYROID  PROBLEM AND IS GOING TO F/U WITH ENDOCRINOLOGIST IN 2 WEEKS   Trichomonas vaginalis (TV) infection 06/20/2020   Past Surgical History: Past Surgical History:  Procedure Laterality Date   ABDOMINAL HYSTERECTOMY     CESAREAN SECTION     x3   CORONARY ARTERY BYPASS GRAFT N/A 09/02/2022   Procedure: CORONARY ARTERY BYPASS GRAFTING (CABG) X 1 USING LEFT INTERNAL MAMMARY ARTERY;  Surgeon: Lucas Dorise POUR, MD;  Location: [facility];  Service: Open Heart Surgery;  Laterality: N/A;   CYSTOSCOPY N/A 09/29/2017   Procedure: CYSTOSCOPY;  Surgeon: Lake Read, MD;  Location: [facility] ORS;  Service: Gynecology;  Laterality: N/A;   CYSTOSCOPY N/A 11/05/2017   Procedure: CYSTOSCOPY;  Surgeon: Lake Read, MD;  Location: [facility] ORS;  Service: Gynecology;  Laterality: N/A;   HYSTEROSCOPY WITH D & C N/A 09/01/2017   Procedure: DILATATION AND CURETTAGE /HYSTEROSCOPY;  Surgeon: Lake Read, MD;  Location: [facility] ORS;  Service: Gynecology;  Laterality: N/A;   IMPLANT POCKET INCISION & DRAINAGE N/A 10/06/2023   Procedure: IMPLANT POCKET INCISION & DRAINAGE;  Surgeon: Kennyth Chew, MD;  Location: [facility] INVASIVE CV LAB;  Service: Cardiovascular;  Laterality: N/A;   LAPAROSCOPY N/A 09/01/2017   Procedure: LAPAROSCOPY OPERATIVE with biopsy;  Surgeon: Lake Read, MD;  Location: [facility] ORS;  Service: Gynecology;  Laterality: N/A;   LAPAROSCOPY N/A 11/05/2017   Procedure: LAPAROSCOPY DIAGNOSTIC;  Surgeon: Lake Read, MD;  Location: [facility] ORS;  Service: Gynecology;  Laterality: N/A;   LEFT HEART CATH AND CORONARY ANGIOGRAPHY N/A 09/01/2022   Procedure: LEFT HEART CATH AND CORONARY ANGIOGRAPHY;  Surgeon: Mady Bruckner, MD;  Location: [facility] INVASIVE CV LAB;  Service: Cardiovascular;  Laterality: N/A;   REPAIR VAGINAL CUFF N/A 11/05/2017   Procedure: REPAIR VAGINAL CUFF;  Surgeon: Lake Read, MD;  Location: [facility] ORS;  Service: Gynecology;  Laterality: N/A;   SUBQ ICD IMPLANT N/A 08/08/2023   Procedure: SUBQ ICD IMPLANT;  Surgeon: Kennyth Chew, MD;  Location: [facility] INVASIVE CV LAB;  Service: Cardiovascular;  Laterality: N/A;   SUBQ ICD REVISION N/A 08/09/2023   Procedure: SUBQ ICD REVISION;  Surgeon: Kennyth Chew, MD;  Location: [facility] INVASIVE CV LAB;  Service: Cardiovascular;  Laterality: N/A;   SUBQ ICD REVISION N/A 09/05/2023   Procedure: SUBQ ICD REVISION;  Surgeon: Kennyth Chew,  MD;  Location: [facility] INVASIVE CV LAB;  Service: Cardiovascular;  Laterality: N/A;   TEE WITHOUT CARDIOVERSION N/A 09/02/2022   Procedure: TRANSESOPHAGEAL ECHOCARDIOGRAM;  Surgeon: Lucas Dorise POUR, MD;  Location[facility] OR;  Service: Open Heart Surgery;  Laterality: N/A;   TOTAL LAPAROSCOPIC HYSTERECTOMY WITH SALPINGECTOMY Bilateral 09/29/2017   Procedure: TOTAL LAPAROSCOPIC HYSTERECTOMY WITH SALPINGECTOMY;  Surgeon: Lake Read, MD;  Location: [facility] ORS;  Service: Gynecology;  Laterality: Bilateral;   Past Gynecologic History:  Post hysterectomy Denies history of HRT use.  Denies abnormal Pap. Unsure of STD history. Sexually active.   OB History:  OB History  Gravida Para  Term Preterm AB Living  3 3 3   3   SAB IAB Ectopic Multiple Live Births      3    # Outcome Date GA Lbr Len/2nd Weight Sex Type Anes PTL Lv  3 Term 12/13/07     CS-LTranv     2 Term 10/10/06     CS-LTranv     1 Term 10/11/05     CS-LTranv       Family History: Family History  Problem Relation Age of Onset   Hypertension Mother    Alcohol abuse Mother    Deep vein thrombosis Mother    Alcohol abuse Father    COPD Father    Emphysema Father    Heart disease Father     Social History: Social History   Socioeconomic History   Marital status: Single    Spouse name: Not on file   Number of children: Not on file   Years of education: Not on file   Highest education level: Not on file  Occupational History   Not on file  Tobacco Use   Smoking status: Every Day    Current packs/day: 0.50    Average packs/day: 0.5 packs/day for 18.0 years (9.0 ttl pk-yrs)    Types: Cigarettes    Passive exposure: Past   Smokeless tobacco: Never  Vaping Use   Vaping status: Never Used  Substance and Sexual Activity   Alcohol use: Not Currently   Drug use: Not Currently    Types: Marijuana, Cocaine   Sexual activity: Yes    Partners: Male    Birth control/protection: Surgical    Comment: Hysterectomy  Other Topics Concern   Not on file  Social History Narrative   Not on file   Social Drivers of Health   Tobacco Use: High Risk (04/20/2024)   Patient History    Smoking Tobacco Use: Every Day    Smokeless Tobacco Use: Never    Passive Exposure: Past  Financial Resource Strain: Not on file  Food Insecurity: No Food Insecurity (11/09/2023)   Epic    Worried About Programme Researcher, Broadcasting/film/video in the Last Year: Never true    Ran Out of Food in the Last Year: Never true  Transportation Needs: No Transportation Needs (11/09/2023)   Epic    Lack of Transportation (Medical): No    Lack of Transportation (Non-Medical): No  Physical Activity: Not on file  Stress: Not on file  Social Connections:  Unknown (08/08/2023)   Social Connection and Isolation Panel    Frequency of Communication with Friends and Family: More than three times a week    Frequency of Social Gatherings with Friends and Family: More than three times a week    Attends Religious Services: Not on file    Active Member of Clubs or Organizations: Not on file    Attends [employer]  or Organization Meetings: Not on file    Marital Status: Living with partner  Intimate Partner Violence: Not At Risk (11/09/2023)   Epic    Fear of Current or Ex-Partner: No    Emotionally Abused: No    Physically Abused: No    Sexually Abused: No  Depression (PHQ2-9): Low Risk (11/09/2023)   Depression (PHQ2-9)    PHQ-2 Score: 0  Alcohol Screen: Not on file  Housing: Unknown (11/24/2023)   Received from [facility]   Epic    Unable to Pay for Housing in the Last Year: Not on file    Number of Times Moved in the Last Year: Not on file    At any time in the past 12 months, were you homeless or living in a shelter (including now)?: No  Utilities: Not At Risk (11/09/2023)   Epic    Threatened with loss of utilities: No  Health Literacy: Not on file    Allergies: Allergies  Allergen Reactions   Azithromycin Swelling and Other (See Comments)   Clindamycin  Swelling   Codeine Hives and Other (See Comments)    Nausea/dizzy    Meloxicam  Other (See Comments)    Burns up from the inside    Neurontin [Gabapentin] Other (See Comments)    Makes her pass out and not remember what happened prior to taking med.   Penicillins Anaphylaxis    Patient allergic to all cillins Has patient had a PCN reaction causing immediate rash, facial/tongue/throat swelling, SOB or lightheadedness with hypotension: Yes Has patient had a PCN reaction causing severe rash involving mucus membranes or skin necrosis: Yes Has patient had a PCN reaction that required hospitalization: No Has patient had a PCN reaction occurring within the last 10 years: Yes If  all of the above answers are NO, then may proceed with Cephalosporin use.    Other Hives and Other (See Comments)    Berry flavored food and drinks, blue and red dye Can have Strawberry and Banana.  Buffalo sauce   Paxil [Paroxetine] Cough   Diphenhydramine  Hcl Rash and Other (See Comments)    Uses dye-free; allergic to dye in regular benadryl    Doxycycline Rash   Egg Protein-Containing Drug Products Rash    Current Medications: Current Outpatient Medications  Medication Sig Dispense Refill   acetaminophen  (TYLENOL ) 500 MG tablet Take 1,000 mg by mouth every 8 (eight) hours as needed for moderate pain (pain score 4-6).     albuterol  (VENTOLIN  HFA) 108 (90 Base) MCG/ACT inhaler Inhale 2 puffs into the lungs every 6 (six) hours as needed for wheezing or shortness of breath. 8 g 2   ALPRAZolam  (ALPRAZOLAM  XR) 0.5 MG 24 hr tablet Take 1 tablet (0.5 mg total) by mouth 2 (two) times daily. 60 tablet 2   aspirin  81 MG chewable tablet Chew 1 tablet (81 mg total) by mouth daily. 90 tablet 1   atorvastatin  (LIPITOR) 40 MG tablet Take 1 tablet (40 mg total) by mouth daily. 90 tablet 1   Biotin w/ Vitamins C & E (HAIR SKIN & NAILS GUMMIES PO) Take 1 tablet by mouth daily. Biotin     cetirizine  (ZYRTEC ) 10 MG tablet Take 1 tablet (10 mg total) by mouth daily. 90 tablet 1   clopidogrel  (PLAVIX ) 75 MG tablet Take 1 tablet (75 mg total) by mouth daily. (Patient not taking: Reported on 04/16/2024) 90 tablet 3   diphenhydrAMINE  (BENADRYL ) 25 MG tablet Take 50 mg by mouth every 6 (six) hours as  needed (allergic reaction). Dye and glutin free     empagliflozin  (JARDIANCE ) 10 MG TABS tablet Take 1 tablet (10 mg total) by mouth daily before breakfast. 90 tablet 1   EPIPEN  2-PAK 0.3 MG/0.3ML SOAJ injection INJECT INTO THIGH MUSCLE THROUGH CLOTHES AS NEEDED SEVERE ALLERGIC REACTION 2 each 1   fluticasone  (FLONASE ) 50 MCG/ACT nasal spray 1 spray by Each Nare route daily. 16 g 6   furosemide  (LASIX ) 20 MG tablet  Take 1 tablet (20 mg total) by mouth daily as needed. 90 tablet 1   Hypertonic Nasal Wash (SINUS RINSE REFILL) PACK Place 1 each into the nose in the morning and at bedtime. 100 each 2   methimazole  (TAPAZOLE ) 10 MG tablet Take 1 tablet (10 mg total) by mouth daily. (Patient taking differently: Take 15 mg by mouth daily. Total 15 mg) 90 tablet 1   metoprolol  succinate (TOPROL  XL) 25 MG 24 hr tablet Take 1 tablet (25 mg total) by mouth daily. 90 tablet 1   montelukast  (SINGULAIR ) 10 MG tablet Take 1 tablet (10 mg total) by mouth at bedtime. 90 tablet 1   naloxone  (NARCAN ) nasal spray 4 mg/0.1 mL Place 1 spray into the nose Once PRN for up to 1 dose (opioid overdose). 2 each 0   naproxen  (NAPROSYN ) 375 MG tablet Take 1 tablet (375 mg total) by mouth 2 (two) times daily with a meal. 60 tablet 1   omeprazole  (PRILOSEC) 20 MG capsule Take 1 capsule (20 mg total) by mouth 2 (two) times daily. 180 capsule 1   ondansetron  (ZOFRAN -ODT) 4 MG disintegrating tablet Take 1 tablet (4 mg total) by mouth every 6 (six) hours as needed for nausea or vomiting. 20 tablet 0   spironolactone  (ALDACTONE ) 25 MG tablet Take 1 tablet (25 mg total) by mouth daily. 90 tablet 1   traMADol  (ULTRAM ) 50 MG tablet Take 1 tablet (50 mg total) by mouth 2 (two) times daily as needed. 10 tablet 0   No current facility-administered medications for this visit.   Review of Systems General:  fatigue Skin: no complaints Eyes: no complaints HEENT: allergies Breasts: no complaints Pulmonary: cough Cardiac: no complaints Gastrointestinal: abdominal pain, bloating, distention, decreased appetite Genitourinary/Sexual: pelvic pain Ob/Gyn: no complaints Musculoskeletal: no complaints Hematology: no complaints Neurologic/Psych: numbness & tingling of toes/feet   Objective:  Physical Examination:  LMP 08/22/2017 Comment: says she just spots.    ECOG Performance Status: 1 - Symptomatic but completely ambulatory  GENERAL: Patient is  a well appearing female in no acute distress HEENT:  Sclera clear. Anicteric NODES:  Negative axillary, supraclavicular, inguinal lymph node survery LUNGS:  Clear to auscultation bilaterally.   HEART:  Regular rate and rhythm.  ABDOMEN:  Soft, nontender.  No hernias, incisions well healed. No masses or ascites EXTREMITIES:  No peripheral edema. Atraumatic. No cyanosis SKIN:  Clear with no obvious rashes or skin changes.  NEURO:  Nonfocal. Well oriented.  Appropriate affect.  Pelvic: Exam Chaperoned by RN EGBUS: no lesions Cervix: surgically absent Vagina: no lesions, no discharge or bleeding Uterus: surgically absent Adnexa: 10 cm midline smooth mass with limited mobility    Lab Review 11/09/23 CA 125 59.2 (^) Inhibin A 28.8 Inhibin B 53.7 HE4  58.5 CEA  2.3  Radiologic Imaging: 11/17/23- CT Chest Abdomen Pelvis W Contrast   CLINICAL DATA:  Cystic left ovarian lesion.     EXAM: CT CHEST, ABDOMEN, AND PELVIS WITH CONTRAST COMPARISON:  11/02/2023 abdominopelvic CT.  10/07/2023 chest CT.   FINDINGS:  CT CHEST FINDINGS   Cardiovascular: Aortic atherosclerosis. Mild cardiomegaly, without pericardial effusion. Median sternotomy for CABG. No central pulmonary embolism, on this non-dedicated study.   Mediastinum/Nodes: Thyroid  nodules of maximally 1.6 cm. No supraclavicular adenopathy.   No mediastinal or hilar adenopathy.   Lungs/Pleura: No pleural fluid. Motion degradation in the lower chest.   Subsegmental atelectasis in both lung bases.   5 mm right lower lobe pulmonary nodule on 104/4 is similar to on the prior and present back to at least 08/31/2022, considered benign.   Musculoskeletal: Presternal skin and subcutaneous defect superiorly on 16/2. Presternal subcutaneous edema and minimal gas inferiorly including on 44/2. These are both the site of larger gas pockets on the prior.   Intact sternotomy wires.   CT ABDOMEN PELVIS FINDINGS   Hepatobiliary: Mild caudate  lobe enlargement. Motion degradation continuing into the upper abdomen. Gallbladder likely surgically absent.   Pancreas: Normal, without mass or ductal dilatation.   Spleen: Normal in size, without focal abnormality.   Adrenals/Urinary Tract: Normal adrenal glands. Normal kidneys, without hydronephrosis. Normal urinary bladder.   Stomach/Bowel: Normal stomach, without wall thickening. Colonic stool burden suggests constipation. Normal terminal ileum. Normal small bowel.   Vascular/Lymphatic: Aortic atherosclerosis. No abdominopelvic adenopathy.   Reproductive: Hysterectomy.  The left ovary is  normal on 88/2.   A cystic mass within the central pelvis, favored to arise from the right ovary, measures 8.7 by 9.5 cm on 101/2. Compare 9.9 x 9.4 cm when measured in a similar fashion on the prior. 9.4 cm craniocaudal today on sagittal image 81 versus 9.0 cm on the prior when remeasured in a similar fashion.   Other: Mild pelvic floor laxity. No significant free fluid. No free intraperitoneal air.   Musculoskeletal: No acute osseous abnormality.   IMPRESSION: 1. Mild motion degradation involving the lower chest and abdomen. 2. Similar size of a pelvic cystic mass, favored to arise from the right ovary and represent benign or malignant cystic neoplasm. No evidence of metastatic disease. 3. Right lower lobe pulmonary nodules unchanged back to at least 08/31/2022, considered benign. 4. Areas of soft tissue thickening and gas in the presternal space, at the site of gas pockets on the prior exam. Likely sequelae of interval debridement. No well-defined drainable collection identified. 5. Thyroid  nodules up to 1.6 cm. Recommend thyroid  US  (ref: J Am Coll Radiol. 2015 Feb;12(2): 143-50).  6. Incidental findings, including: Aortic Atherosclerosis (ICD10-I70.0). Possible constipation.    Assessment:  Elizabeth Spencer is a 39 y.o. female who presents with enlarging pelvic mass 7/25 with mural thickening  measuring 12.4 x 8.2 x 8.9 cm enlarged from ultrasound in 2020 for at which time it measured 7.4 x 5.8 x 5.3 cm.  Additionally, increased vascularity within the thickened wall.  She is status post hysterectomy for benign disease.  Symptomatic of pain. Pre-menopausal ROMA score 11.5% (high risk), however CA125 may be due to cardiac issues and not overly concerning in a 39 y.o. Her HE4 is normal which is reasurring. Cystic mass arising from right ovary measuring 8.7 x 9.5 cm, enlarging. No evidence of metastatic disease or lymphadenopathy.   Due to cardiac issues, decision made to aspirate mass 9/25 with 400 ml removed and negative cytology. 05/02/24 image guided drainage of 7.3 cm mass with negative cytology. Removed 140 ml of brown fluid and cytology negative.   She still has pain and is tired of the procedures and wants surgery to remove the mass.    Hysterectomy for benign disease,  last Pap normal and HPV negative, therefore no further Paps needed.   Medical co-morbidities complicating care: 3 prior c sections, previous abdominal surgeries, prior CABG, heart failure, obesity with BMI of 39.66, history of DVT, NSTEMI, psychiatric illness. Plan:   Problem List Items Addressed This Visit       Other   Pelvic mass in female - Primary   Relevant Orders   Ambulatory referral to Gynecologic Oncology   Other Visit Diagnoses       Screening for STD (sexually transmitted disease)       Relevant Orders   Chlamydia/NGC rt PCR ([facility] only)     Cyst of ovary, right       Relevant Orders   Ambulatory referral to Gynecologic Oncology      We discussed that she would need cardiology clearance for surgery.  Spoke to the cardiology NP and she said the patient has not even had an ECHO since 2024. Offered the patient to be seen by [facility] for clearance for surgery at [facility].  Patient declined and would rather have the surgery in [place].  I explained that we do not operate in [place], so we will  refer her to [facility] in [place] with Dr Leonce.     Vaginal swab done today for STDs per patient request.   We can see her back for further management in the future if she desires.   The patient's diagnosis, an outline of the further diagnostic and laboratory studies which will be required, the recommendation for surgery, and alternatives were discussed with her and her accompanying family members.  All questions were answered to their satisfaction.     Prentice Agent, MD

## 2024-05-09 NOTE — Progress Notes (Signed)
 Patient was seen in room 20 of the  [facility] Department., where I assisted with pelvic exam. I, Rayfield Jasmine RN, was present as chaperone for the sensitive portion of the exam.  Referral sent to [facility] oncology

## 2024-05-11 ENCOUNTER — Telehealth: Payer: Self-pay | Admitting: *Deleted

## 2024-05-11 NOTE — Telephone Encounter (Signed)
 Spoke with the patient regarding the referral to GYN oncology. Patient scheduled as new patient with Dr Eldonna on 2/23 at 9:45 am. Patient given an arrival time of 9:15 am  Explained to the patient the the doctor will perform a pelvic exam at this visit. Patient given the policy that only one visitor allowed and that visitor must be over 16 yrs are allowed in the Cancer Center. Patient given the address/phone number for the clinic and that the center offers free valet service. Patient aware that masks optional.

## 2024-05-24 ENCOUNTER — Other Ambulatory Visit: Payer: MEDICAID

## 2024-05-28 ENCOUNTER — Inpatient Hospital Stay: Payer: MEDICAID | Admitting: Psychiatry

## 2024-06-08 ENCOUNTER — Ambulatory Visit: Payer: MEDICAID | Admitting: Family

## 2024-09-26 ENCOUNTER — Ambulatory Visit: Payer: MEDICAID | Admitting: Family
